# Patient Record
Sex: Female | Born: 1980 | Race: White | Hispanic: No | Marital: Married | Smoking: Current every day smoker
Health system: Southern US, Community
[De-identification: ages and names within clinical notes are randomized; demographics above are authoritative.]

## PROBLEM LIST (undated history)

## (undated) DIAGNOSIS — L732 Hidradenitis suppurativa: Secondary | ICD-10-CM

## (undated) DIAGNOSIS — Z87410 Personal history of cervical dysplasia: Secondary | ICD-10-CM

## (undated) DIAGNOSIS — Z9641 Presence of insulin pump (external) (internal): Secondary | ICD-10-CM

## (undated) DIAGNOSIS — F4312 Post-traumatic stress disorder, chronic: Secondary | ICD-10-CM

## (undated) DIAGNOSIS — Z860101 Personal history of adenomatous and serrated colon polyps: Secondary | ICD-10-CM

## (undated) DIAGNOSIS — J189 Pneumonia, unspecified organism: Secondary | ICD-10-CM

## (undated) DIAGNOSIS — Z8759 Personal history of other complications of pregnancy, childbirth and the puerperium: Secondary | ICD-10-CM

## (undated) DIAGNOSIS — G43909 Migraine, unspecified, not intractable, without status migrainosus: Secondary | ICD-10-CM

## (undated) DIAGNOSIS — Z86010 Personal history of colonic polyps: Secondary | ICD-10-CM

## (undated) DIAGNOSIS — E119 Type 2 diabetes mellitus without complications: Secondary | ICD-10-CM

## (undated) DIAGNOSIS — R011 Cardiac murmur, unspecified: Secondary | ICD-10-CM

## (undated) DIAGNOSIS — F411 Generalized anxiety disorder: Secondary | ICD-10-CM

## (undated) DIAGNOSIS — E559 Vitamin D deficiency, unspecified: Secondary | ICD-10-CM

## (undated) DIAGNOSIS — Z86718 Personal history of other venous thrombosis and embolism: Secondary | ICD-10-CM

## (undated) DIAGNOSIS — Z8639 Personal history of other endocrine, nutritional and metabolic disease: Secondary | ICD-10-CM

## (undated) DIAGNOSIS — N920 Excessive and frequent menstruation with regular cycle: Secondary | ICD-10-CM

## (undated) DIAGNOSIS — F410 Panic disorder [episodic paroxysmal anxiety] without agoraphobia: Secondary | ICD-10-CM

## (undated) DIAGNOSIS — E282 Polycystic ovarian syndrome: Secondary | ICD-10-CM

## (undated) DIAGNOSIS — C801 Malignant (primary) neoplasm, unspecified: Secondary | ICD-10-CM

## (undated) DIAGNOSIS — I471 Supraventricular tachycardia: Secondary | ICD-10-CM

## (undated) DIAGNOSIS — K219 Gastro-esophageal reflux disease without esophagitis: Secondary | ICD-10-CM

## (undated) DIAGNOSIS — F329 Major depressive disorder, single episode, unspecified: Secondary | ICD-10-CM

## (undated) DIAGNOSIS — F32A Depression, unspecified: Secondary | ICD-10-CM

## (undated) DIAGNOSIS — F3181 Bipolar II disorder: Secondary | ICD-10-CM

## (undated) DIAGNOSIS — O2230 Deep phlebothrombosis in pregnancy, unspecified trimester: Secondary | ICD-10-CM

## (undated) DIAGNOSIS — L02412 Cutaneous abscess of left axilla: Secondary | ICD-10-CM

## (undated) DIAGNOSIS — L0390 Cellulitis, unspecified: Secondary | ICD-10-CM

## (undated) DIAGNOSIS — Z8614 Personal history of Methicillin resistant Staphylococcus aureus infection: Secondary | ICD-10-CM

## (undated) DIAGNOSIS — M797 Fibromyalgia: Secondary | ICD-10-CM

## (undated) HISTORY — DX: Cardiac murmur, unspecified: R01.1

## (undated) HISTORY — DX: Personal history of Methicillin resistant Staphylococcus aureus infection: Z86.14

## (undated) HISTORY — PX: CHALAZION EXCISION: SHX213

## (undated) HISTORY — DX: Vitamin D deficiency, unspecified: E55.9

## (undated) HISTORY — PX: BUNIONECTOMY: SHX129

## (undated) HISTORY — PX: BREAST EXCISIONAL BIOPSY: SUR124

## (undated) HISTORY — PX: TONSILLECTOMY AND ADENOIDECTOMY: SUR1326

## (undated) HISTORY — PX: EXCISION OF BREAST BIOPSY: SHX5822

## (undated) HISTORY — DX: Supraventricular tachycardia: I47.1

## (undated) HISTORY — DX: Depression, unspecified: F32.A

## (undated) HISTORY — DX: Major depressive disorder, single episode, unspecified: F32.9

## (undated) HISTORY — PX: WISDOM TOOTH EXTRACTION: SHX21

## (undated) HISTORY — DX: Cutaneous abscess of left axilla: L02.412

## (undated) HISTORY — PX: CARDIAC CATHETERIZATION: SHX172

## (undated) HISTORY — PX: TONSILLECTOMY: SUR1361

## (undated) HISTORY — PX: EYE SURGERY: SHX253

## (undated) HISTORY — PX: BREAST SURGERY: SHX581

## (undated) HISTORY — DX: Migraine, unspecified, not intractable, without status migrainosus: G43.909

## (undated) HISTORY — DX: Deep phlebothrombosis in pregnancy, unspecified trimester: O22.30

---
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
## 2000-09-07 ENCOUNTER — Emergency Department (HOSPITAL_COMMUNITY): Admission: EM | Admit: 2000-09-07 | Discharge: 2000-09-08 | Payer: Self-pay | Admitting: Emergency Medicine

## 2000-09-21 ENCOUNTER — Ambulatory Visit (HOSPITAL_COMMUNITY): Admission: RE | Admit: 2000-09-21 | Discharge: 2000-09-21 | Payer: Self-pay | Admitting: Orthopedic Surgery

## 2000-10-05 ENCOUNTER — Encounter: Admission: RE | Admit: 2000-10-05 | Discharge: 2000-10-05 | Payer: Self-pay | Admitting: Family Medicine

## 2000-10-07 ENCOUNTER — Encounter: Admission: RE | Admit: 2000-10-07 | Discharge: 2001-01-05 | Payer: Self-pay | Admitting: Family Medicine

## 2000-10-13 ENCOUNTER — Encounter: Admission: RE | Admit: 2000-10-13 | Discharge: 2000-10-13 | Payer: Self-pay | Admitting: Family Medicine

## 2000-10-20 ENCOUNTER — Encounter: Admission: RE | Admit: 2000-10-20 | Discharge: 2000-10-20 | Payer: Self-pay | Admitting: Family Medicine

## 2001-01-17 ENCOUNTER — Inpatient Hospital Stay (HOSPITAL_COMMUNITY): Admission: EM | Admit: 2001-01-17 | Discharge: 2001-01-21 | Payer: Self-pay | Admitting: Emergency Medicine

## 2001-01-27 ENCOUNTER — Encounter: Admission: RE | Admit: 2001-01-27 | Discharge: 2001-01-27 | Payer: Self-pay | Admitting: Family Medicine

## 2001-02-04 ENCOUNTER — Encounter: Admission: RE | Admit: 2001-02-04 | Discharge: 2001-02-04 | Payer: Self-pay | Admitting: Family Medicine

## 2001-02-09 ENCOUNTER — Encounter: Admission: RE | Admit: 2001-02-09 | Discharge: 2001-05-10 | Payer: Self-pay | Admitting: Sports Medicine

## 2001-02-24 ENCOUNTER — Ambulatory Visit: Admission: RE | Admit: 2001-02-24 | Discharge: 2001-02-24 | Payer: Self-pay | Admitting: Family Medicine

## 2001-02-24 ENCOUNTER — Encounter: Admission: RE | Admit: 2001-02-24 | Discharge: 2001-02-24 | Payer: Self-pay | Admitting: Family Medicine

## 2001-03-01 ENCOUNTER — Encounter: Admission: RE | Admit: 2001-03-01 | Discharge: 2001-03-01 | Payer: Self-pay | Admitting: Family Medicine

## 2001-04-05 ENCOUNTER — Encounter: Admission: RE | Admit: 2001-04-05 | Discharge: 2001-04-05 | Payer: Self-pay | Admitting: Family Medicine

## 2001-04-21 ENCOUNTER — Encounter: Admission: RE | Admit: 2001-04-21 | Discharge: 2001-04-21 | Payer: Self-pay | Admitting: Family Medicine

## 2001-06-25 ENCOUNTER — Encounter: Admission: RE | Admit: 2001-06-25 | Discharge: 2001-06-25 | Payer: Self-pay | Admitting: Family Medicine

## 2001-07-05 ENCOUNTER — Encounter: Admission: RE | Admit: 2001-07-05 | Discharge: 2001-07-05 | Payer: Self-pay | Admitting: Family Medicine

## 2001-07-12 ENCOUNTER — Encounter: Admission: RE | Admit: 2001-07-12 | Discharge: 2001-07-12 | Payer: Self-pay | Admitting: Family Medicine

## 2001-07-26 ENCOUNTER — Encounter: Admission: RE | Admit: 2001-07-26 | Discharge: 2001-07-26 | Payer: Self-pay | Admitting: Sports Medicine

## 2001-08-16 ENCOUNTER — Encounter: Admission: RE | Admit: 2001-08-16 | Discharge: 2001-08-16 | Payer: Self-pay | Admitting: Family Medicine

## 2001-10-09 ENCOUNTER — Inpatient Hospital Stay (HOSPITAL_COMMUNITY): Admission: EM | Admit: 2001-10-09 | Discharge: 2001-10-12 | Payer: Self-pay | Admitting: *Deleted

## 2001-10-20 ENCOUNTER — Encounter: Admission: RE | Admit: 2001-10-20 | Discharge: 2001-10-20 | Payer: Self-pay | Admitting: Family Medicine

## 2001-10-25 ENCOUNTER — Encounter: Admission: RE | Admit: 2001-10-25 | Discharge: 2001-10-25 | Payer: Self-pay | Admitting: Sports Medicine

## 2001-11-01 ENCOUNTER — Encounter: Admission: RE | Admit: 2001-11-01 | Discharge: 2002-01-30 | Payer: Self-pay | Admitting: Family Medicine

## 2002-02-10 ENCOUNTER — Encounter: Admission: RE | Admit: 2002-02-10 | Discharge: 2002-02-10 | Payer: Self-pay | Admitting: Family Medicine

## 2002-03-10 ENCOUNTER — Other Ambulatory Visit: Admission: RE | Admit: 2002-03-10 | Discharge: 2002-03-10 | Payer: Self-pay | Admitting: Gynecology

## 2002-05-16 ENCOUNTER — Encounter: Admission: RE | Admit: 2002-05-16 | Discharge: 2002-05-16 | Payer: Self-pay | Admitting: Family Medicine

## 2002-05-24 ENCOUNTER — Encounter: Admission: RE | Admit: 2002-05-24 | Discharge: 2002-05-24 | Payer: Self-pay | Admitting: Family Medicine

## 2002-06-15 ENCOUNTER — Encounter: Admission: RE | Admit: 2002-06-15 | Discharge: 2002-06-15 | Payer: Self-pay | Admitting: Family Medicine

## 2002-06-20 ENCOUNTER — Encounter: Admission: RE | Admit: 2002-06-20 | Discharge: 2002-06-20 | Payer: Self-pay | Admitting: Sports Medicine

## 2002-07-28 ENCOUNTER — Encounter: Admission: RE | Admit: 2002-07-28 | Discharge: 2002-07-28 | Payer: Self-pay | Admitting: Family Medicine

## 2002-08-22 ENCOUNTER — Encounter (INDEPENDENT_AMBULATORY_CARE_PROVIDER_SITE_OTHER): Payer: Self-pay | Admitting: *Deleted

## 2002-08-22 LAB — CONVERTED CEMR LAB

## 2002-09-09 ENCOUNTER — Encounter: Admission: RE | Admit: 2002-09-09 | Discharge: 2002-09-09 | Payer: Self-pay | Admitting: Family Medicine

## 2002-09-19 ENCOUNTER — Encounter: Admission: RE | Admit: 2002-09-19 | Discharge: 2002-09-19 | Payer: Self-pay | Admitting: Sports Medicine

## 2002-10-21 ENCOUNTER — Encounter: Admission: RE | Admit: 2002-10-21 | Discharge: 2002-10-21 | Payer: Self-pay | Admitting: Family Medicine

## 2002-11-09 ENCOUNTER — Encounter: Admission: RE | Admit: 2002-11-09 | Discharge: 2002-11-09 | Payer: Self-pay | Admitting: Family Medicine

## 2002-11-21 ENCOUNTER — Encounter: Admission: RE | Admit: 2002-11-21 | Discharge: 2002-11-21 | Payer: Self-pay | Admitting: Family Medicine

## 2002-11-23 ENCOUNTER — Encounter: Admission: RE | Admit: 2002-11-23 | Discharge: 2003-02-21 | Payer: Self-pay | Admitting: Family Medicine

## 2003-06-22 ENCOUNTER — Other Ambulatory Visit: Admission: RE | Admit: 2003-06-22 | Discharge: 2003-06-22 | Payer: Self-pay | Admitting: Gynecology

## 2003-12-14 ENCOUNTER — Emergency Department (HOSPITAL_COMMUNITY): Admission: AD | Admit: 2003-12-14 | Discharge: 2003-12-14 | Payer: Self-pay | Admitting: Family Medicine

## 2004-03-19 ENCOUNTER — Other Ambulatory Visit: Admission: RE | Admit: 2004-03-19 | Discharge: 2004-03-19 | Payer: Self-pay | Admitting: Gynecology

## 2004-07-24 ENCOUNTER — Ambulatory Visit: Payer: Self-pay | Admitting: Internal Medicine

## 2004-08-19 ENCOUNTER — Emergency Department (HOSPITAL_COMMUNITY): Admission: EM | Admit: 2004-08-19 | Discharge: 2004-08-19 | Payer: Self-pay | Admitting: Family Medicine

## 2004-08-20 ENCOUNTER — Emergency Department (HOSPITAL_COMMUNITY): Admission: EM | Admit: 2004-08-20 | Discharge: 2004-08-20 | Payer: Self-pay | Admitting: Emergency Medicine

## 2004-09-22 HISTORY — PX: TAYLOR BUNIONECTOMY: SHX2485

## 2004-10-22 ENCOUNTER — Ambulatory Visit: Payer: Self-pay | Admitting: Internal Medicine

## 2004-11-01 ENCOUNTER — Ambulatory Visit: Payer: Self-pay | Admitting: Licensed Clinical Social Worker

## 2004-11-04 ENCOUNTER — Ambulatory Visit: Payer: Self-pay | Admitting: Licensed Clinical Social Worker

## 2004-11-10 ENCOUNTER — Emergency Department (HOSPITAL_COMMUNITY): Admission: EM | Admit: 2004-11-10 | Discharge: 2004-11-11 | Payer: Self-pay | Admitting: Nurse Practitioner

## 2004-11-12 ENCOUNTER — Ambulatory Visit: Payer: Self-pay | Admitting: Internal Medicine

## 2004-11-14 ENCOUNTER — Ambulatory Visit: Payer: Self-pay | Admitting: Licensed Clinical Social Worker

## 2004-11-20 ENCOUNTER — Ambulatory Visit: Payer: Self-pay | Admitting: Licensed Clinical Social Worker

## 2004-12-03 ENCOUNTER — Ambulatory Visit: Payer: Self-pay | Admitting: Licensed Clinical Social Worker

## 2004-12-13 ENCOUNTER — Ambulatory Visit: Payer: Self-pay | Admitting: Internal Medicine

## 2004-12-16 ENCOUNTER — Ambulatory Visit: Payer: Self-pay | Admitting: Internal Medicine

## 2004-12-27 ENCOUNTER — Ambulatory Visit: Payer: Self-pay | Admitting: Endocrinology

## 2005-03-03 ENCOUNTER — Ambulatory Visit: Payer: Self-pay | Admitting: Internal Medicine

## 2005-04-04 ENCOUNTER — Ambulatory Visit: Payer: Self-pay | Admitting: Endocrinology

## 2005-04-09 ENCOUNTER — Ambulatory Visit: Payer: Self-pay | Admitting: Endocrinology

## 2005-04-16 ENCOUNTER — Ambulatory Visit: Payer: Self-pay | Admitting: Endocrinology

## 2005-05-07 ENCOUNTER — Ambulatory Visit: Payer: Self-pay | Admitting: Internal Medicine

## 2005-07-09 ENCOUNTER — Ambulatory Visit: Payer: Self-pay | Admitting: Endocrinology

## 2005-07-25 ENCOUNTER — Ambulatory Visit: Payer: Self-pay | Admitting: Internal Medicine

## 2005-07-29 ENCOUNTER — Ambulatory Visit: Payer: Self-pay | Admitting: Endocrinology

## 2005-10-15 ENCOUNTER — Ambulatory Visit: Payer: Self-pay | Admitting: Internal Medicine

## 2005-10-21 ENCOUNTER — Emergency Department (HOSPITAL_COMMUNITY): Admission: EM | Admit: 2005-10-21 | Discharge: 2005-10-22 | Payer: Self-pay | Admitting: *Deleted

## 2005-10-22 ENCOUNTER — Encounter (INDEPENDENT_AMBULATORY_CARE_PROVIDER_SITE_OTHER): Payer: Self-pay | Admitting: *Deleted

## 2005-12-02 ENCOUNTER — Ambulatory Visit: Payer: Self-pay | Admitting: Internal Medicine

## 2005-12-19 ENCOUNTER — Ambulatory Visit: Payer: Self-pay | Admitting: Internal Medicine

## 2005-12-23 ENCOUNTER — Ambulatory Visit: Payer: Self-pay | Admitting: Endocrinology

## 2005-12-29 ENCOUNTER — Emergency Department (HOSPITAL_COMMUNITY): Admission: EM | Admit: 2005-12-29 | Discharge: 2005-12-29 | Payer: Self-pay | Admitting: Emergency Medicine

## 2005-12-31 ENCOUNTER — Ambulatory Visit: Payer: Self-pay | Admitting: Internal Medicine

## 2005-12-31 ENCOUNTER — Ambulatory Visit: Payer: Self-pay | Admitting: Endocrinology

## 2006-01-01 ENCOUNTER — Encounter: Admission: RE | Admit: 2006-01-01 | Discharge: 2006-01-01 | Payer: Self-pay | Admitting: Endocrinology

## 2006-01-14 ENCOUNTER — Ambulatory Visit: Payer: Self-pay | Admitting: Endocrinology

## 2006-01-20 ENCOUNTER — Ambulatory Visit: Payer: Self-pay | Admitting: Internal Medicine

## 2006-01-21 ENCOUNTER — Encounter: Payer: Self-pay | Admitting: Emergency Medicine

## 2006-02-10 ENCOUNTER — Ambulatory Visit: Payer: Self-pay | Admitting: Gastroenterology

## 2006-03-17 ENCOUNTER — Ambulatory Visit: Payer: Self-pay | Admitting: Endocrinology

## 2006-04-07 ENCOUNTER — Ambulatory Visit: Payer: Self-pay | Admitting: Endocrinology

## 2006-05-15 ENCOUNTER — Ambulatory Visit: Payer: Self-pay | Admitting: Endocrinology

## 2006-05-22 ENCOUNTER — Other Ambulatory Visit: Admission: RE | Admit: 2006-05-22 | Discharge: 2006-05-22 | Payer: Self-pay | Admitting: Gynecology

## 2006-05-27 ENCOUNTER — Ambulatory Visit: Payer: Self-pay | Admitting: Endocrinology

## 2006-07-22 ENCOUNTER — Ambulatory Visit: Payer: Self-pay | Admitting: Endocrinology

## 2006-09-14 ENCOUNTER — Ambulatory Visit: Payer: Self-pay | Admitting: Internal Medicine

## 2006-10-22 ENCOUNTER — Emergency Department (HOSPITAL_COMMUNITY): Admission: EM | Admit: 2006-10-22 | Discharge: 2006-10-22 | Payer: Self-pay | Admitting: Family Medicine

## 2006-11-20 ENCOUNTER — Encounter (INDEPENDENT_AMBULATORY_CARE_PROVIDER_SITE_OTHER): Payer: Self-pay | Admitting: *Deleted

## 2006-11-25 ENCOUNTER — Ambulatory Visit: Payer: Self-pay | Admitting: Internal Medicine

## 2006-11-25 LAB — CONVERTED CEMR LAB
AST: 17 units/L (ref 0–37)
Alkaline Phosphatase: 82 units/L (ref 39–117)
BUN: 9 mg/dL (ref 6–23)
Basophils Absolute: 0.1 10*3/uL (ref 0.0–0.1)
Bilirubin, Direct: 0.1 mg/dL (ref 0.0–0.3)
Calcium: 9.2 mg/dL (ref 8.4–10.5)
GFR calc non Af Amer: 129 mL/min
HCT: 40.3 % (ref 36.0–46.0)
Hemoglobin: 14.1 g/dL (ref 12.0–15.0)
Hgb A1c MFr Bld: 12.2 % — ABNORMAL HIGH (ref 4.6–6.0)
Lymphocytes Relative: 37.3 % (ref 12.0–46.0)
MCHC: 34.9 g/dL (ref 30.0–36.0)
Neutrophils Relative %: 51.9 % (ref 43.0–77.0)
Potassium: 4 meq/L (ref 3.5–5.1)
Total Bilirubin: 0.6 mg/dL (ref 0.3–1.2)
Total Protein: 6 g/dL (ref 6.0–8.3)
WBC: 7.6 10*3/uL (ref 4.5–10.5)

## 2006-12-02 ENCOUNTER — Encounter (INDEPENDENT_AMBULATORY_CARE_PROVIDER_SITE_OTHER): Payer: Self-pay | Admitting: *Deleted

## 2006-12-02 ENCOUNTER — Observation Stay (HOSPITAL_COMMUNITY): Admission: EM | Admit: 2006-12-02 | Discharge: 2006-12-03 | Payer: Self-pay | Admitting: Emergency Medicine

## 2006-12-02 HISTORY — PX: CHOLECYSTECTOMY: SHX55

## 2006-12-02 HISTORY — PX: CHOLECYSTECTOMY, LAPAROSCOPIC: SHX56

## 2007-01-13 ENCOUNTER — Ambulatory Visit: Payer: Self-pay | Admitting: Internal Medicine

## 2007-01-13 LAB — CONVERTED CEMR LAB
TSH: 0.77 microintl units/mL (ref 0.35–5.50)
Transferrin: 231.6 mg/dL (ref >=212.0)
Vitamin B-12: 228 pg/mL (ref 211–911)

## 2007-02-03 ENCOUNTER — Ambulatory Visit: Payer: Self-pay | Admitting: Pulmonary Disease

## 2007-02-14 ENCOUNTER — Encounter: Payer: Self-pay | Admitting: Gynecology

## 2007-02-14 ENCOUNTER — Ambulatory Visit (HOSPITAL_COMMUNITY): Admission: AD | Admit: 2007-02-14 | Discharge: 2007-02-14 | Payer: Self-pay | Admitting: Gynecology

## 2007-02-14 HISTORY — PX: DILATION AND EVACUATION: SHX1459

## 2007-02-28 ENCOUNTER — Emergency Department (HOSPITAL_COMMUNITY): Admission: EM | Admit: 2007-02-28 | Discharge: 2007-02-28 | Payer: Self-pay | Admitting: Emergency Medicine

## 2007-04-08 DIAGNOSIS — E109 Type 1 diabetes mellitus without complications: Secondary | ICD-10-CM | POA: Insufficient documentation

## 2007-04-08 DIAGNOSIS — F17200 Nicotine dependence, unspecified, uncomplicated: Secondary | ICD-10-CM | POA: Insufficient documentation

## 2007-04-08 DIAGNOSIS — Z87898 Personal history of other specified conditions: Secondary | ICD-10-CM | POA: Insufficient documentation

## 2007-04-08 DIAGNOSIS — J45998 Other asthma: Secondary | ICD-10-CM | POA: Insufficient documentation

## 2007-04-08 DIAGNOSIS — F329 Major depressive disorder, single episode, unspecified: Secondary | ICD-10-CM

## 2007-04-08 DIAGNOSIS — F32A Depression, unspecified: Secondary | ICD-10-CM | POA: Insufficient documentation

## 2007-05-25 ENCOUNTER — Ambulatory Visit: Payer: Self-pay | Admitting: Internal Medicine

## 2007-07-07 ENCOUNTER — Ambulatory Visit: Payer: Self-pay | Admitting: Internal Medicine

## 2007-07-07 DIAGNOSIS — R000 Tachycardia, unspecified: Secondary | ICD-10-CM | POA: Insufficient documentation

## 2007-07-07 LAB — CONVERTED CEMR LAB
Calcium: 9.1 mg/dL (ref 8.4–10.5)
TSH: 0.87 microintl units/mL (ref 0.35–5.50)

## 2007-08-18 ENCOUNTER — Encounter: Payer: Self-pay | Admitting: Internal Medicine

## 2007-09-15 ENCOUNTER — Emergency Department (HOSPITAL_COMMUNITY): Admission: EM | Admit: 2007-09-15 | Discharge: 2007-09-15 | Payer: Self-pay | Admitting: Emergency Medicine

## 2007-10-15 ENCOUNTER — Emergency Department (HOSPITAL_COMMUNITY): Admission: EM | Admit: 2007-10-15 | Discharge: 2007-10-15 | Payer: Self-pay | Admitting: Emergency Medicine

## 2007-10-18 ENCOUNTER — Telehealth: Payer: Self-pay | Admitting: Internal Medicine

## 2007-10-19 ENCOUNTER — Ambulatory Visit: Payer: Self-pay | Admitting: Internal Medicine

## 2007-10-20 LAB — CONVERTED CEMR LAB
Amylase: 61 units/L (ref 27–131)
BUN: 14 mg/dL (ref 6–23)
Basophils Relative: 0.6 % (ref 0.0–1.0)
CO2: 29 meq/L (ref 19–32)
Calcium: 9.6 mg/dL (ref 8.4–10.5)
Creatinine, Ser: 0.7 mg/dL (ref 0.4–1.2)
GFR calc Af Amer: 130 mL/min
Glucose, Bld: 324 mg/dL — ABNORMAL HIGH (ref 70–99)
Lymphocytes Relative: 39.7 % (ref 12.0–46.0)
MCHC: 33.7 g/dL (ref 30.0–36.0)
MCV: 92.7 fL (ref 78.0–100.0)
Monocytes Absolute: 0.5 10*3/uL (ref 0.2–0.7)
Monocytes Relative: 5.6 % (ref 3.0–11.0)
Neutro Abs: 4.1 10*3/uL (ref 1.4–7.7)
Neutrophils Relative %: 49.8 % (ref 43.0–77.0)
Potassium: 4.4 meq/L (ref 3.5–5.1)
RBC: 4.73 M/uL (ref 3.87–5.11)
Sodium: 138 meq/L (ref 135–145)
WBC: 8.1 10*3/uL (ref 4.5–10.5)

## 2007-10-22 ENCOUNTER — Ambulatory Visit: Payer: Self-pay | Admitting: Pulmonary Disease

## 2007-10-24 DIAGNOSIS — F411 Generalized anxiety disorder: Secondary | ICD-10-CM | POA: Insufficient documentation

## 2007-11-14 ENCOUNTER — Ambulatory Visit: Payer: Self-pay | Admitting: Internal Medicine

## 2007-11-14 ENCOUNTER — Inpatient Hospital Stay (HOSPITAL_COMMUNITY): Admission: EM | Admit: 2007-11-14 | Discharge: 2007-11-20 | Payer: Self-pay | Admitting: Emergency Medicine

## 2007-11-14 DIAGNOSIS — E871 Hypo-osmolality and hyponatremia: Secondary | ICD-10-CM | POA: Insufficient documentation

## 2007-11-14 DIAGNOSIS — D72829 Elevated white blood cell count, unspecified: Secondary | ICD-10-CM | POA: Insufficient documentation

## 2007-11-14 DIAGNOSIS — E1010 Type 1 diabetes mellitus with ketoacidosis without coma: Secondary | ICD-10-CM | POA: Insufficient documentation

## 2007-11-29 ENCOUNTER — Ambulatory Visit: Payer: Self-pay | Admitting: Internal Medicine

## 2007-11-29 DIAGNOSIS — J209 Acute bronchitis, unspecified: Secondary | ICD-10-CM | POA: Insufficient documentation

## 2008-01-26 ENCOUNTER — Emergency Department (HOSPITAL_COMMUNITY): Admission: EM | Admit: 2008-01-26 | Discharge: 2008-01-26 | Payer: Self-pay | Admitting: Emergency Medicine

## 2008-01-28 ENCOUNTER — Ambulatory Visit: Payer: Self-pay | Admitting: Internal Medicine

## 2008-02-01 ENCOUNTER — Emergency Department (HOSPITAL_COMMUNITY): Admission: EM | Admit: 2008-02-01 | Discharge: 2008-02-01 | Payer: Self-pay | Admitting: Family Medicine

## 2008-03-17 ENCOUNTER — Ambulatory Visit: Payer: Self-pay | Admitting: Internal Medicine

## 2008-03-17 DIAGNOSIS — R110 Nausea: Secondary | ICD-10-CM | POA: Insufficient documentation

## 2008-04-25 ENCOUNTER — Emergency Department (HOSPITAL_COMMUNITY): Admission: EM | Admit: 2008-04-25 | Discharge: 2008-04-25 | Payer: Self-pay | Admitting: Emergency Medicine

## 2008-05-09 ENCOUNTER — Ambulatory Visit: Payer: Self-pay | Admitting: Pulmonary Disease

## 2008-06-05 ENCOUNTER — Ambulatory Visit: Payer: Self-pay | Admitting: Internal Medicine

## 2008-06-08 ENCOUNTER — Emergency Department (HOSPITAL_COMMUNITY): Admission: EM | Admit: 2008-06-08 | Discharge: 2008-06-08 | Payer: Self-pay | Admitting: Family Medicine

## 2008-06-16 ENCOUNTER — Encounter: Payer: Self-pay | Admitting: Adult Health

## 2008-06-27 ENCOUNTER — Ambulatory Visit: Payer: Self-pay | Admitting: Internal Medicine

## 2008-06-28 ENCOUNTER — Encounter: Payer: Self-pay | Admitting: Internal Medicine

## 2008-07-05 ENCOUNTER — Encounter: Payer: Self-pay | Admitting: Internal Medicine

## 2008-07-06 ENCOUNTER — Encounter: Payer: Self-pay | Admitting: Internal Medicine

## 2008-07-12 ENCOUNTER — Encounter (INDEPENDENT_AMBULATORY_CARE_PROVIDER_SITE_OTHER): Payer: Self-pay | Admitting: *Deleted

## 2008-07-18 ENCOUNTER — Telehealth: Payer: Self-pay | Admitting: Internal Medicine

## 2008-07-25 ENCOUNTER — Ambulatory Visit: Payer: Self-pay | Admitting: Internal Medicine

## 2008-07-27 ENCOUNTER — Encounter: Payer: Self-pay | Admitting: Internal Medicine

## 2008-07-30 ENCOUNTER — Inpatient Hospital Stay (HOSPITAL_COMMUNITY): Admission: EM | Admit: 2008-07-30 | Discharge: 2008-08-02 | Payer: Self-pay | Admitting: Emergency Medicine

## 2008-07-30 ENCOUNTER — Ambulatory Visit: Payer: Self-pay | Admitting: Internal Medicine

## 2008-08-04 ENCOUNTER — Ambulatory Visit: Payer: Self-pay | Admitting: Internal Medicine

## 2008-08-04 LAB — CONVERTED CEMR LAB
ALT: 31 units/L (ref 0–35)
AST: 43 units/L — ABNORMAL HIGH (ref 0–37)
Albumin: 2.9 g/dL — ABNORMAL LOW (ref 3.5–5.2)
Bilirubin Urine: NEGATIVE
Calcium, Total (PTH): 8.9 mg/dL (ref 8.4–10.5)
Calcium: 8.7 mg/dL (ref 8.4–10.5)
Chloride: 110 meq/L (ref 96–112)
Creatinine, Ser: 0.6 mg/dL (ref 0.4–1.2)
Eosinophils Absolute: 0.3 10*3/uL (ref 0.0–0.7)
Eosinophils Relative: 3.8 % (ref 0.0–5.0)
GFR calc Af Amer: 155 mL/min
GFR calc non Af Amer: 128 mL/min
Glucose, Bld: 78 mg/dL (ref 70–99)
HCT: 36.2 % (ref 36.0–46.0)
HDL: 40.5 mg/dL (ref >39.0)
Hemoglobin, Urine: NEGATIVE
Leukocytes, UA: NEGATIVE
MCHC: 35.8 g/dL (ref 30.0–36.0)
MCV: 93 fL (ref 78.0–100.0)
Magnesium: 1.6 mg/dL (ref 1.5–2.5)
Monocytes Relative: 6.7 % (ref 3.0–12.0)
Neutrophils Relative %: 60.1 % (ref 43.0–77.0)
Platelets: 355 10*3/uL (ref 150–400)
Sed Rate: 17 mm/hr (ref 0–22)
TSH: 1.03 microintl units/mL (ref 0.35–5.50)
Total Bilirubin: 0.4 mg/dL (ref 0.3–1.2)
Triglycerides: 59 mg/dL (ref 0–149)
Urobilinogen, UA: 0.2 (ref 0.0–1.0)

## 2008-08-06 ENCOUNTER — Encounter: Payer: Self-pay | Admitting: Internal Medicine

## 2008-08-07 ENCOUNTER — Telehealth: Payer: Self-pay | Admitting: Internal Medicine

## 2008-08-11 ENCOUNTER — Ambulatory Visit: Payer: Self-pay | Admitting: Internal Medicine

## 2008-08-11 LAB — CONVERTED CEMR LAB
CO2: 30 meq/L (ref 19–32)
Calcium: 9.5 mg/dL (ref 8.4–10.5)
Creatinine, Ser: 0.6 mg/dL (ref 0.4–1.2)
GFR calc non Af Amer: 128 mL/min
Sodium: 138 meq/L (ref 135–145)

## 2008-08-22 ENCOUNTER — Ambulatory Visit: Payer: Self-pay | Admitting: Internal Medicine

## 2008-09-01 ENCOUNTER — Encounter: Payer: Self-pay | Admitting: Internal Medicine

## 2008-09-07 ENCOUNTER — Emergency Department (HOSPITAL_COMMUNITY): Admission: EM | Admit: 2008-09-07 | Discharge: 2008-09-07 | Payer: Self-pay | Admitting: Family Medicine

## 2008-10-05 ENCOUNTER — Telehealth: Payer: Self-pay | Admitting: Internal Medicine

## 2008-10-18 ENCOUNTER — Telehealth: Payer: Self-pay | Admitting: Internal Medicine

## 2008-11-02 ENCOUNTER — Telehealth: Payer: Self-pay | Admitting: Internal Medicine

## 2008-11-16 ENCOUNTER — Other Ambulatory Visit: Payer: Self-pay | Admitting: Emergency Medicine

## 2008-11-16 ENCOUNTER — Inpatient Hospital Stay (HOSPITAL_COMMUNITY): Admission: AD | Admit: 2008-11-16 | Discharge: 2008-11-17 | Payer: Self-pay | Admitting: Obstetrics and Gynecology

## 2008-11-16 ENCOUNTER — Other Ambulatory Visit: Payer: Self-pay

## 2008-11-20 ENCOUNTER — Ambulatory Visit: Payer: Self-pay | Admitting: Internal Medicine

## 2008-12-02 ENCOUNTER — Inpatient Hospital Stay (HOSPITAL_COMMUNITY): Admission: AD | Admit: 2008-12-02 | Discharge: 2008-12-02 | Payer: Self-pay | Admitting: Obstetrics and Gynecology

## 2008-12-03 ENCOUNTER — Inpatient Hospital Stay (HOSPITAL_COMMUNITY): Admission: AD | Admit: 2008-12-03 | Discharge: 2008-12-04 | Payer: Self-pay | Admitting: Obstetrics and Gynecology

## 2009-01-08 ENCOUNTER — Emergency Department (HOSPITAL_COMMUNITY): Admission: EM | Admit: 2009-01-08 | Discharge: 2009-01-08 | Payer: Self-pay | Admitting: Family Medicine

## 2009-01-11 ENCOUNTER — Telehealth: Payer: Self-pay | Admitting: Internal Medicine

## 2009-03-07 ENCOUNTER — Telehealth: Payer: Self-pay | Admitting: Internal Medicine

## 2009-04-04 ENCOUNTER — Inpatient Hospital Stay (HOSPITAL_COMMUNITY): Admission: AD | Admit: 2009-04-04 | Discharge: 2009-04-06 | Payer: Self-pay | Admitting: Obstetrics and Gynecology

## 2009-04-04 ENCOUNTER — Encounter (INDEPENDENT_AMBULATORY_CARE_PROVIDER_SITE_OTHER): Payer: Self-pay | Admitting: Obstetrics and Gynecology

## 2009-07-22 ENCOUNTER — Ambulatory Visit: Payer: Self-pay | Admitting: Critical Care Medicine

## 2009-07-22 ENCOUNTER — Inpatient Hospital Stay (HOSPITAL_COMMUNITY): Admission: EM | Admit: 2009-07-22 | Discharge: 2009-07-27 | Payer: Self-pay | Admitting: Emergency Medicine

## 2009-07-30 ENCOUNTER — Ambulatory Visit: Payer: Self-pay | Admitting: Critical Care Medicine

## 2009-07-31 ENCOUNTER — Encounter: Payer: Self-pay | Admitting: Critical Care Medicine

## 2009-07-31 LAB — CONVERTED CEMR LAB
Hemoglobin, Urine: NEGATIVE
Ketones, ur: NEGATIVE mg/dL
Leukocytes, UA: NEGATIVE
Total Protein, Urine: 30 mg/dL
Urobilinogen, UA: 0.2 (ref 0.0–1.0)
pH: 6.5 (ref 5.0–8.0)

## 2009-08-01 ENCOUNTER — Telehealth: Payer: Self-pay | Admitting: Internal Medicine

## 2009-08-01 ENCOUNTER — Encounter: Payer: Self-pay | Admitting: Internal Medicine

## 2009-08-22 ENCOUNTER — Ambulatory Visit: Payer: Self-pay | Admitting: Internal Medicine

## 2009-08-22 DIAGNOSIS — R05 Cough: Secondary | ICD-10-CM

## 2009-08-22 DIAGNOSIS — R059 Cough, unspecified: Secondary | ICD-10-CM | POA: Insufficient documentation

## 2009-09-04 ENCOUNTER — Telehealth: Payer: Self-pay | Admitting: Internal Medicine

## 2009-09-19 ENCOUNTER — Ambulatory Visit: Payer: Self-pay | Admitting: Internal Medicine

## 2009-09-19 DIAGNOSIS — R519 Headache, unspecified: Secondary | ICD-10-CM | POA: Insufficient documentation

## 2009-09-19 DIAGNOSIS — R51 Headache: Secondary | ICD-10-CM | POA: Insufficient documentation

## 2009-10-08 ENCOUNTER — Telehealth: Payer: Self-pay | Admitting: Internal Medicine

## 2009-10-10 ENCOUNTER — Telehealth: Payer: Self-pay | Admitting: Internal Medicine

## 2009-10-17 ENCOUNTER — Ambulatory Visit: Payer: Self-pay | Admitting: Internal Medicine

## 2009-10-17 DIAGNOSIS — J069 Acute upper respiratory infection, unspecified: Secondary | ICD-10-CM | POA: Insufficient documentation

## 2009-10-31 ENCOUNTER — Encounter: Payer: Self-pay | Admitting: Internal Medicine

## 2009-11-08 ENCOUNTER — Telehealth: Payer: Self-pay | Admitting: Internal Medicine

## 2009-11-28 ENCOUNTER — Encounter: Admission: RE | Admit: 2009-11-28 | Discharge: 2009-11-28 | Payer: Self-pay | Admitting: Internal Medicine

## 2009-12-10 ENCOUNTER — Inpatient Hospital Stay (HOSPITAL_COMMUNITY): Admission: EM | Admit: 2009-12-10 | Discharge: 2009-12-12 | Payer: Self-pay | Admitting: Emergency Medicine

## 2009-12-19 ENCOUNTER — Encounter: Payer: Self-pay | Admitting: Internal Medicine

## 2010-01-18 ENCOUNTER — Ambulatory Visit (HOSPITAL_COMMUNITY): Admission: RE | Admit: 2010-01-18 | Discharge: 2010-01-18 | Payer: Self-pay | Admitting: Internal Medicine

## 2010-03-12 ENCOUNTER — Ambulatory Visit: Payer: Self-pay | Admitting: Internal Medicine

## 2010-03-12 ENCOUNTER — Telehealth: Payer: Self-pay | Admitting: Internal Medicine

## 2010-03-13 ENCOUNTER — Emergency Department (HOSPITAL_COMMUNITY): Admission: EM | Admit: 2010-03-13 | Discharge: 2010-03-13 | Payer: Self-pay | Admitting: Family Medicine

## 2010-03-14 ENCOUNTER — Ambulatory Visit: Payer: Self-pay | Admitting: Internal Medicine

## 2010-04-01 ENCOUNTER — Inpatient Hospital Stay (HOSPITAL_COMMUNITY): Admission: EM | Admit: 2010-04-01 | Discharge: 2010-04-03 | Payer: Self-pay | Admitting: Emergency Medicine

## 2010-05-10 ENCOUNTER — Ambulatory Visit: Payer: Self-pay | Admitting: Internal Medicine

## 2010-05-10 ENCOUNTER — Encounter (INDEPENDENT_AMBULATORY_CARE_PROVIDER_SITE_OTHER): Payer: Self-pay | Admitting: *Deleted

## 2010-05-10 LAB — CONVERTED CEMR LAB
ALT: 14 units/L (ref 0–35)
AST: 21 units/L (ref 0–37)
Albumin: 3.7 g/dL (ref 3.5–5.2)
Alkaline Phosphatase: 81 units/L (ref 39–117)
BUN: 16 mg/dL (ref 6–23)
Calcium: 9.5 mg/dL (ref 8.4–10.5)
Cholesterol: 196 mg/dL (ref 0–200)
Creatinine, Ser: 0.6 mg/dL (ref 0.4–1.2)
LDL Cholesterol: 124 mg/dL — ABNORMAL HIGH (ref 0–99)
Potassium: 4.1 meq/L (ref 3.5–5.1)
Sodium: 140 meq/L (ref 135–145)
Total Bilirubin: 0.5 mg/dL (ref 0.3–1.2)
Total Protein: 6.8 g/dL (ref 6.0–8.3)

## 2010-05-20 ENCOUNTER — Ambulatory Visit: Payer: Self-pay | Admitting: Internal Medicine

## 2010-05-20 DIAGNOSIS — J302 Other seasonal allergic rhinitis: Secondary | ICD-10-CM

## 2010-05-20 DIAGNOSIS — J3089 Other allergic rhinitis: Secondary | ICD-10-CM | POA: Insufficient documentation

## 2010-05-29 ENCOUNTER — Other Ambulatory Visit: Admission: RE | Admit: 2010-05-29 | Discharge: 2010-05-29 | Payer: Self-pay | Admitting: Family Medicine

## 2010-07-18 ENCOUNTER — Ambulatory Visit: Payer: Self-pay | Admitting: Internal Medicine

## 2010-09-25 ENCOUNTER — Encounter: Payer: Self-pay | Admitting: Internal Medicine

## 2010-09-25 ENCOUNTER — Ambulatory Visit (HOSPITAL_COMMUNITY)
Admission: RE | Admit: 2010-09-25 | Discharge: 2010-09-25 | Payer: Self-pay | Source: Home / Self Care | Attending: Internal Medicine | Admitting: Internal Medicine

## 2010-09-27 ENCOUNTER — Encounter: Payer: Self-pay | Admitting: Internal Medicine

## 2010-10-13 ENCOUNTER — Encounter: Payer: Self-pay | Admitting: Internal Medicine

## 2010-10-22 NOTE — Assessment & Plan Note (Signed)
Summary: hives/cb  Nurse Visit   Allergies: 1)  ! Phenergan (Promethazine Hcl) 2)  ! Darvocet A500 3)  ! Avandia (Rosiglitazone Maleate) 4)  ! Sulfadiazine (Sulfadiazine) 5)  ! Penicillin V Potassium (Penicillin V Potassium) 6)  ! Ceclor 7)  ! Percocet 8)  ! Levaquin (Levofloxacin) 9)  ! * Avelox 10)  ! * Latex   ORDER WAS PUT IN UNDER THE PHONE NOTE

## 2010-10-22 NOTE — Letter (Signed)
Summary: New Patient letter  [facility]  [address] [place], [place] [address]   Phone: [phone]  Fax: [phone]       05/10/2010 MRN: [id number]  [facility] [address] [place], [place]  [address]  Dear Ms. [facility],  Welcome to the Gastroenterology Division at [facility].    You are scheduled to see Dr.  Russella Dar on 07/11/10 at 9:30am on the 3rd floor at [facility], [address]  We ask that you try to arrive at our office 15 minutes prior to your appointment time to allow for check-in.  We would like you to complete the enclosed self-administered evaluation form prior to your visit and bring it with you on the day of your appointment.  We will review it with you.  Also, please bring a complete list of all your medications or, if you prefer, bring the medication bottles and we will list them.  Please bring your insurance card so that we may make a copy of it.  If your insurance requires a referral to see a specialist, please bring your referral form from your primary care physician.  Co-payments are due at the time of your visit and may be paid by cash, check or credit card.     Your office visit will consist of a consult with your physician (includes a physical exam), any laboratory testing he/she may order, scheduling of any necessary diagnostic testing (e.g. x-ray, ultrasound, CT-scan), and scheduling of a procedure (e.g. Endoscopy, Colonoscopy) if required.  Please allow enough time on your schedule to allow for any/all of these possibilities.    If you cannot keep your appointment, please call [phone] to cancel or reschedule prior to your appointment date.  This allows [place] the opportunity to schedule an appointment for another patient in need of care.  If you do not cancel or reschedule by 5 p.m. the business day prior to your appointment date, you will be charged a $50.00 late cancellation/no-show fee.    Thank you for  choosing [facility] for your medical needs.  We appreciate the opportunity to care for you.  Please visit [place] at our website  to learn more about our practice.                     Sincerely,                                                             The Gastroenterology Division

## 2010-10-22 NOTE — Progress Notes (Signed)
Summary: Cough, hemoptysis  Phone Note Call from Patient   Summary of Call: Malaise, maybe fever. sore throat. Has been coughng for 2-3 weeks c/w bronchitis. reports coughing up a nickle sized red blood today. Sore mid anterior chest.  Exam- No cervical adenopathy. tongue lightly coated/ no erythema or stridor. Quiet chest, no rhonchi or wheeze. Heart- WNL RRR, no murmur. Imp- Bronchitis. Plan- Avelox 40 mg x 5 days. CXR.    New/Updated Medications: AVELOX 400 MG TABS (MOXIFLOXACIN HCL) 1 daily Prescriptions: AVELOX 400 MG TABS (MOXIFLOXACIN HCL) 1 daily  #5 x 0   Entered and Authorized by:   Waymon Budge MD   Signed by:   Waymon Budge MD on 03/12/2010   Method used:   Samples Given   RxID:   [id number]

## 2010-10-22 NOTE — Letter (Signed)
Summary: [facility]   Imported By: Sherian Rein 11/20/2009 08:34:56  _____________________________________________________________________  External Attachment:    Type:   Image     Comment:   External Document

## 2010-10-22 NOTE — Letter (Signed)
Summary: Dorris Fetch MD  Dorris Fetch MD   Imported By: Lester Chapin 01/16/2010 10:38:26  _____________________________________________________________________  External Attachment:    Type:   Image     Comment:   External Document

## 2010-10-22 NOTE — Assessment & Plan Note (Signed)
Summary: Acute NP office visit - SOB, elevated HR   Primary Provider/Referring Provider:  Tresa Garter MD  CC:  increased SOB, elevated HR, and elevated BS to 400+ onset last night.  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 Roselyn Bering that has a known history of DM insulin dependent .    October 17, 2009--Presents for an acute office visit. Complains of 1 day of cold symptoms, nasal congesiton stuffy nose, tight feeling. Used xopenex inhaler w/ some relief. Sick on stomach w/ vomitting after breakfast this am. Daughter age 3 with similar symptoms. Reports BS 400 this am.  She has not called her primary or DM MD for these symptoms. Denies chest pain, orthopnea, hemoptysis, fever, n/v/d, edema, headache,recent travel or antibiotics.   Preventive Screening-Counseling & Management  Alcohol-Tobacco     Packs/Day: 0.5  Medications Prior to Update: 1)  Humalog 100 Unit/ml Soln (Insulin Lispro (Human)) .... Sliding Scale 2)  Lantus 100 Unit/ml Soln (Insulin Glargine) .... 36 Units Qpm 3)  Bc Pills .... Take 1 Tablet By Mouth Once A Day 4)  Alprazolam 0.5 Mg Tabs (Alprazolam) .Marland Kitchen.. 1 Two Times A Day As Needed 5)  Ibuprofen 600 Mg Tabs (Ibuprofen) .Marland Kitchen.. 1 By Mouth Bid  Pc X 1 Wk Then As Needed For  Pain 6)  Sumatriptan Succinate 100 Mg Tabs (Sumatriptan Succinate) .Marland Kitchen.. 1 By Mouth Once Daily As Needed Migraine 7)  Promethazine Hcl 25 Mg Tabs (Promethazine Hcl) .Marland Kitchen.. 1-2 By Mouth Four Times A Day As Needed Nausea 8)  Hydrocodone-Acetaminophen 5-325 Mg Tabs (Hydrocodone-Acetaminophen) .Marland Kitchen.. 1-2 By Mouth Two Times A Day As Needed Pain 9)  Ortho-Cyclen (28) 0.25-35 Mg-Mcg Tabs (Norgestimate-Eth Estradiol) .Marland Kitchen.. 1 Tab Once A Day  Current Medications (verified): 1)  Humalog 100 Unit/ml Soln (Insulin Lispro (Human)) .... Sliding Scale 2)  Lantus 100 Unit/ml Soln (Insulin Glargine) .... 38units At Bedtime 3)  Ibuprofen 600 Mg Tabs (Ibuprofen) .Marland Kitchen.. 1 By Mouth Bid   Pc X 1 Wk Then As Needed For  Pain 4)  Sumatriptan Succinate 100 Mg Tabs (Sumatriptan Succinate) .Marland Kitchen.. 1 By Mouth Once Daily As Needed Migraine 5)  Promethazine Hcl 25 Mg Tabs (Promethazine Hcl) .Marland Kitchen.. 1-2 By Mouth Four Times A Day As Needed Nausea 6)  Hydrocodone-Acetaminophen 5-325 Mg Tabs (Hydrocodone-Acetaminophen) .Marland Kitchen.. 1-2 By Mouth Two Times A Day As Needed Pain 7)  Ortho-Cyclen (28) 0.25-35 Mg-Mcg Tabs (Norgestimate-Eth Estradiol) .Marland Kitchen.. 1 Tab Once A Day  Allergies (verified): 1)  ! Phenergan (Promethazine Hcl) 2)  ! Darvocet A500 (Propoxyphene N-Apap) 3)  ! Avandia (Rosiglitazone Maleate) 4)  ! Sulfadiazine (Sulfadiazine) 5)  ! Penicillin V Potassium (Penicillin V Potassium) 6)  ! Ceclor 7)  ! Percocet 8)  ! Levaquin (Levofloxacin) 9)  ! * Latex  Past History:  Past Medical History: Last updated: 05/09/2008 TACHYCARDIA, PAROXYSMAL ATRIAL (ICD-427.0) FAMILY HISTORY DIABETES 1ST DEGREE RELATIVE (ICD-V18.0) SMOKER (ICD-305.1) MIGRAINES, HX OF (ICD-V13.8) CONTRACEPTIVE MANAGEMENT (ICD-V25.09) DIABETES MELLITUS, TYPE I (ICD-250.01), h/o DKA 2009 DEPRESSION (ICD-311) ASTHMA (ICD-493.90)    Past Surgical History: Last updated: 04/08/2007 diabetic ketoacidosis 01/2001 Cholecystectomy  Family History: Last updated: 05/09/2008 Family History Diabetes 1st degree relative Family History Hypertension  Social History: Last updated: 10/17/2009 smokes 1/2-1/4ppd. Works full time,   Married Alcohol use-no Current Smoker Drug use-no Regular exercise-yes  Risk Factors: Smoking Status: current (08/22/2009) Packs/Day: 0.5 (10/17/2009)  Social History: smokes 1/2-1/4ppd. Works full time,   Married Alcohol use-no Current Smoker Drug use-no Regular exercise-yes Packs/Day:  0.5  Review of Systems      See HPI  Vital Signs:  Patient profile:   30 year old female Height:      65 inches Weight:      204 pounds BMI:     34.07 O2 Sat:      99 % on Room air Temp:     97.5  degrees F oral Pulse rate:   113 / minute BP sitting:   116 / 82  (left arm) Cuff size:   regular  Vitals Entered By: Boone Master CNA (October 17, 2009 10:42 AM)  O2 Flow:  Room air CC: increased SOB, elevated HR, elevated BS to 400+ onset last night Is Patient Diabetic? Yes Comments Medications reviewed with patient Daytime contact number verified with patient. Boone Master CNA  October 17, 2009 10:42 AM    Physical Exam  Additional Exam:  GENERAL:  A/Ox3; pleasant & cooperative.NAD HEENT:  [place]/AT, EOM-wnl, PERRLA, EACs-clear, TMs-wnl, NOSE-pale NECK:  Supple w/ fair ROM; no JVD; normal carotid impulses w/o bruits; no thyromegaly or nodules palpated; no lymphadenopathy. CHEST:  Coarse Bs bilaterally HEART:  RRR, no m/r/g  heard ABDOMEN:  Soft & nt; nml bowel sounds; no organomegaly or masses detected EXT: Warm bilat,  no calf pain, edema, clubbing, pulses intact    Impression & Recommendations:  Problem # 1:  UPPER RESPIRATORY INFECTION, ACUTE (ICD-465.9)  Zpack to have on hold if symptoms worsen with discolored mucus Mucinex two times a day as needed cough/congesiton Nasonex 2 puffs two times a day  Saline nasal rinses as needed     Orders: Est. Patient Level IV (01027)  Problem # 2:  DIABETES W/KETOACIDOSIS TYPE I [JUV] UNCNTRL (ICD-250.13) BS elevated w/ illness w/ assoicated nausea pt is high risk, w/ multiple admissions w/ dehydration and DKA Dr. Sharl Ma office very nicely will see pt at 2 pm today.  Her updated medication list for this problem includes:    Humalog 100 Unit/ml Soln (Insulin lispro (human)) ..... Sliding scale    Lantus 100 Unit/ml Soln (Insulin glargine) .Marland KitchenMarland KitchenMarland KitchenMarland Kitchen 38units at bedtime  Medications Added to Medication List This Visit: 1)  Lantus 100 Unit/ml Soln (Insulin glargine) .... 38units at bedtime 2)  Zithromax Z-pak 250 Mg Tabs (Azithromycin) .... Take as directed.  Complete Medication List: 1)  Humalog 100 Unit/ml Soln (Insulin lispro  (human)) .... Sliding scale 2)  Lantus 100 Unit/ml Soln (Insulin glargine) .... 38units at bedtime 3)  Ibuprofen 600 Mg Tabs (Ibuprofen) .Marland Kitchen.. 1 by mouth bid  pc x 1 wk then as needed for  pain 4)  Sumatriptan Succinate 100 Mg Tabs (Sumatriptan succinate) .Marland Kitchen.. 1 by mouth once daily as needed migraine 5)  Promethazine Hcl 25 Mg Tabs (Promethazine hcl) .Marland Kitchen.. 1-2 by mouth four times a day as needed nausea 6)  Hydrocodone-acetaminophen 5-325 Mg Tabs (Hydrocodone-acetaminophen) .Marland Kitchen.. 1-2 by mouth two times a day as needed pain 7)  Zithromax Z-pak 250 Mg Tabs (Azithromycin) .... Take as directed.  Patient Instructions: 1)  Zpack to have on hold if symptoms worsen with discolored mucus 2)  Mucinex two times a day as needed cough/congesiton 3)  Nasonex 2 puffs two times a day  4)  Saline nasal rinses as needed  5)  Increae fluids  6)  We are referring you to Dr. Sharl Ma for your Diabetes today 7)  Please contact office for sooner follow up if symptoms do not improve or worsen  Prescriptions: ZITHROMAX Z-PAK 250 MG TABS (AZITHROMYCIN) take as directed.  #  1 x 0   Entered and Authorized by:   Rubye Oaks NP   Signed by:   Tammy Parrett NP on 10/17/2009   Method used:   Print then Give to Patient   RxID:   [id number]    Immunization History:  Influenza Immunization History:    Influenza:  historical (06/22/2009)  Pneumovax Immunization History:    Pneumovax:  historical (06/22/2006)   Appended Document: neb documentation    Clinical Lists Changes  Orders: Added new Service order of Nebulizer Tx (44034) - Signed

## 2010-10-22 NOTE — Progress Notes (Signed)
Summary: Avelox allergy  Phone Note Other Incoming   Summary of Call: Update- within 30 minutes after taking avelox, which she has tolerated before, she has develioped generalized pruritic rash. Given Allegra 180. holding dem=pomedrol due to her diabetes. Allergy file updated. Consider ER. Still pending result of CXR. CXR - WNL For pruritus, given benadryl 50 mg IM at 1210PM. Mother taking her home. Not wheezing.  Initial call taken by: Waymon Budge MD,  March 12, 2010 12:15 PM   New Allergies: ! * AVELOX New/Updated Medications: BENADRYL 50 MG/ML SOLN (DIPHENHYDRAMINE HCL) 50 mg given IM x 1 ALLEGRA 180 MG TABS (FEXOFENADINE HCL) 1 given po New Allergies: ! * AVELOXPrescriptions: ALLEGRA 180 MG TABS (FEXOFENADINE HCL) 1 given po  #1 x 0   Entered and Authorized by:   Waymon Budge MD   Signed by:   Waymon Budge MD on 03/12/2010   Method used:   Historical   RxID:   [id number] BENADRYL 50 MG/ML SOLN (DIPHENHYDRAMINE HCL) 50 mg given IM x 1  #50 mg x 0   Entered and Authorized by:   Waymon Budge MD   Signed by:   Waymon Budge MD on 03/12/2010   Method used:   Historical   RxID:   [id number]   Appended Document: Avelox allergy   Medication Administration  Injection # 1:    Medication: Benadryl  IM or IV    Diagnosis: COUGH (ICD-786.2)    Route: IM    Site: R deltoid    Exp Date: 12/23/2010    Lot #: [id number]    Mfr: Sander Radon plus    Patient tolerated injection without complications    Given by: Denna Haggard, CMA (March 12, 2010 12:27 PM)  Orders Added: 1)  Benadryl  IM or IV [J1200]

## 2010-10-22 NOTE — Miscellaneous (Signed)
Summary: Orders Update  Clinical Lists Changes  Orders: Added new Test order of T-Foot Right (73630TC) - Signed 

## 2010-10-22 NOTE — Progress Notes (Signed)
  Phone Note Call from Patient   Caller: Patient Call For: Tresa Garter MD Summary of Call: pt is requesting refill on birth control  Initial call taken by: Ami Bullins CMA,  October 08, 2009 3:27 PM    New/Updated Medications: ORTHO TRI-CYCLEN LO 0.18/0.215/0.25 MG-25 MCG TABS (NORGESTIM-ETH ESTRAD TRIPHASIC) 1 a day Prescriptions: ORTHO TRI-CYCLEN LO 0.18/0.215/0.25 MG-25 MCG TABS (NORGESTIM-ETH ESTRAD TRIPHASIC) 1 a day  #30 x 5   Entered by:   Ami Bullins CMA   Authorized by:   Tresa Garter MD   Signed by:   Bill Salinas CMA on 10/08/2009   Method used:   [facility] [address] [phone]* (retail)       [address]       [place], [place]  [address]       Ph: [phone] or [phone]       Fax: [phone]   RxID:   [id number]

## 2010-10-22 NOTE — Assessment & Plan Note (Signed)
Summary: ALLERGY CONSULT- SELF REFERRAL//kp   Vital Signs:  Patient profile:   30 year old female Height:      65 inches Weight:      207 pounds O2 Sat:      99 % on Room air Pulse rate:   120 / minute BP sitting:   104 / 62  (left arm) Cuff size:   regular  Vitals Entered By: Carver Fila (May 20, 2010 3:43 PM)  O2 Flow:  Room air CC: allergy new pt Is Patient Diabetic? Yes Comments meds and allergies updated Phone number updated Carver Fila  May 20, 2010 3:44 PM    Visit Type:  Initial Consult Primary Provider/Referring Provider:  C. White, MD  CC:  allergy new pt.  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 As child and adult has had nasal congestion and rhinorhea, ears stopped up. Seasonal, Spring worse than Fall. No definite asthma. Triggers- Seasonal. Wears mask mowing grass ( sneeze, itch, water) and dusting. Mother's car. perfumes. Exposure to allergy testing tray. Foods- ? corn husks. Chocolate, strawberries and tomatoes make tongue itch. OK with aspirin.  Latex- contact rash. Tried Singulair, fluticasone intermittently. Clarinex some help. Several medication allergies listed. Avoids prednisone due to diabetes.   Preventive Screening-Counseling & Management  Alcohol-Tobacco     Alcohol drinks/day: 0     Smoking Status: current     Smoking Cessation Counseling: yes     Packs/Day: 0.5     Year Started: 1999     Year Quit: 2009     Pack years: 12 years 1ppd     Tobacco Counseling: to quit use of tobacco products  Caffeine-Diet-Exercise     Does Patient Exercise: yes  Allergies: 1)  ! Phenergan (Promethazine Hcl) 2)  ! Darvocet A500 3)  ! Avandia (Rosiglitazone Maleate) 4)  ! Sulfadiazine (Sulfadiazine) 5)  ! Penicillin V Potassium (Penicillin V Potassium) 6)  ! Ceclor 7)  ! Percocet 8)  ! Levaquin (Levofloxacin) 9)  ! * Avelox 10)  ! * Latex  Past History:  Past Medical  History: TACHYCARDIA, PAROXYSMAL ATRIAL (ICD-427.0) FAMILY HISTORY DIABETES 1ST DEGREE RELATIVE (ICD-V18.0) SMOKER (ICD-305.1) MIGRAINES, HX OF (ICD-V13.8) CONTRACEPTIVE MANAGEMENT (ICD-V25.09) DIABETES MELLITUS, TYPE I (ICD-250.01), h/o DKA 2009, 2011 Recurrent folliculitis DEPRESSION (ICD-311) Allergic rhinitis    Past Surgical History: Cholecystectomy Tonsillectomy Benign breast lump 2000  Family History: Family History Diabetes 1st degree relative Family History Hypertension Breast Cancer-- mother Heart disease--GM, GF, UNCLE AND AUNTS Asthma--GM Allergies--Mother side of family Family History Emphysema--GM  Social History: Current Smoker. smokes 1/2 per 3 days.  Works full time,  [employer] Divorced lives with daughter and sister Alcohol use-no Drug use-no Regular exercise-yes  Review of Systems      See HPI       The patient complains of shortness of breath with activity, non-productive cough, irregular heartbeats, acid heartburn, indigestion, difficulty swallowing, sore throat, headaches, nasal congestion/difficulty breathing through nose, sneezing, itching, ear ache, depression, and hand/feet swelling.  The patient denies shortness of breath at rest, productive cough, coughing up blood, chest pain, loss of appetite, weight change, abdominal pain, tooth/dental problems, anxiety, joint stiffness or pain, rash, change in color of mucus, and fever.    Physical Exam  Additional Exam:  General: A/Ox3; pleasant, intelligent  and cooperative, NAD, SKIN: no rash, lesions NODES: no lymphadenopathy HEENT: [place]/AT, EOM- WNL, Conjuctivae- clear, PERRLA, TM-WNL, Nose- clear, Throat- clear and wnl, Mallampati  III NECK: Supple w/ fair ROM, JVD- none, normal carotid impulses w/o bruits Thyroid- normal to palpation CHEST: Clear to P&A HEART: RRR, no m/g/r heard ABDOMEN: overweight ZOX:WRUE, nl pulses, no edema  NEURO: Grossly intact to observation      Impression &  Recommendations:  Problem # 1:  ALLERGIC RHINITIS (ICD-477.9) Seasonal and perennial allergic rhinoconjunctivitis. There is some room to give oral antihistamines, nasal steroid and antihistamine sprays, and phenylephrine a good try. If insufficient we can evaluate for allergy vaccine. Need to work on avoidance of dust mites. May need work adjustment to remove from exposure to allergy testing materials if inhalation is causing symptoms. The following medications were removed from the medication list:    Promethazine Hcl 25 Mg Tabs (Promethazine hcl) .Marland Kitchen... 1-2 by mouth four times a day as needed nausea    Benadryl 50 Mg/ml Soln (Diphenhydramine hcl) .Marland KitchenMarland KitchenMarland KitchenMarland Kitchen 50 mg given im x 1    Allegra 180 Mg Tabs (Fexofenadine hcl) .Marland Kitchen... 1 given po  Medications Added to Medication List This Visit: 1)  Lantus 100 Unit/ml Soln (Insulin glargine) .... 50 units at bedtime  Other Orders: No Charge Patient Arrived (NCPA0) (NCPA0)  Patient Instructions: 1)  Please schedule a follow-up appointment as needed. 2)  Suggest a maintnenance antihistamine daily during your peak seasons or ahead of anticipated exposures 3)  allegra 60 or 180 4)  Claritin/ loratadine  or Clarinex 5)  You can add a decongestant otc- Sudafed PE/ phenylephrine for occasional use if your heart rate will tolerate it. 6)  nasal saline sprays and Breathe Right nasal strips may help   Immunization History:  Pneumovax Immunization History:    Pneumovax:  historical (03/22/2010)

## 2010-10-22 NOTE — Progress Notes (Signed)
Summary: REFERRAL  Phone Note Call from Patient Call back at Home Phone [phone]   Summary of Call: Pt c/o increase in migraines and she would like referral to neurologist.  Initial call taken by: Lamar Sprinkles, CMA,  November 08, 2009 8:38 AM  Follow-up for Phone Call        [facility] Dr Dr Chriss [facility] Follow-up by: Tresa Garter MD,  November 08, 2009 1:16 PM

## 2010-10-22 NOTE — Progress Notes (Signed)
       New/Updated Medications: ORTHO-CYCLEN (28) 0.25-35 MG-MCG TABS (NORGESTIMATE-ETH ESTRADIOL) 1 tab once a day Prescriptions: ORTHO-CYCLEN (28) 0.25-35 MG-MCG TABS (NORGESTIMATE-ETH ESTRADIOL) 1 tab once a day  #30 x 2   Entered by:   Ami Bullins CMA   Authorized by:   Tresa Garter MD   Signed by:   Bill Salinas CMA on 10/10/2009   Method used:   [facility]        [facility]  [address] [phone]* (retail)       [address]       [place], [place]  [address]       Ph: [phone] or [phone]       Fax: [phone]   RxID:   [id number]

## 2010-10-23 ENCOUNTER — Encounter: Payer: Self-pay | Admitting: Internal Medicine

## 2010-10-24 NOTE — Miscellaneous (Signed)
Summary: "Sinus headache" schedule  CT sinus  Clinical Lists Changes  Orders: Added new Referral order of Radiology Referral (Radiology) - Signed

## 2010-10-24 NOTE — Miscellaneous (Signed)
Summary: diflucan sent to the pharmacy  Clinical Lists Changes  Medications: Added new medication of FLUCONAZOLE 150 MG TABS (FLUCONAZOLE) take one daily - Signed Rx of FLUCONAZOLE 150 MG TABS (FLUCONAZOLE) take one daily;  #7 x 0;  Signed;  Entered by: Randell Loop CMA;  Authorized by: Waymon Budge MD;  Method used[facility] [address] [phone]*, [address] [place], [place], [place]  [address], Ph: [phone] or [phone], Fax: [phone]    Prescriptions: FLUCONAZOLE 150 MG TABS (FLUCONAZOLE) take one daily  #7 x 0   Entered by:   Randell Loop CMA   Authorized by:   Waymon Budge MD   Signed by:   Randell Loop CMA on 09/27/2010   Method used:   [facility]ronically to        [facility] [address] [phone]* (retail)       [address]       [place], [place]  [address]       Ph: [phone] or [phone]       Fax: [phone]   RxID:   [id number]

## 2010-12-04 ENCOUNTER — Ambulatory Visit (HOSPITAL_COMMUNITY)
Admission: RE | Admit: 2010-12-04 | Discharge: 2010-12-04 | Disposition: A | Discharge status: Home / Self Care | Payer: 59 | Source: Ambulatory Visit | Attending: Interventional Cardiology | Admitting: Interventional Cardiology

## 2010-12-04 DIAGNOSIS — R000 Tachycardia, unspecified: Secondary | ICD-10-CM | POA: Insufficient documentation

## 2010-12-04 DIAGNOSIS — E119 Type 2 diabetes mellitus without complications: Secondary | ICD-10-CM | POA: Insufficient documentation

## 2010-12-04 DIAGNOSIS — F17200 Nicotine dependence, unspecified, uncomplicated: Secondary | ICD-10-CM | POA: Insufficient documentation

## 2010-12-08 LAB — BASIC METABOLIC PANEL
BUN: 14 mg/dL (ref 6–23)
BUN: 19 mg/dL (ref 6–23)
BUN: 8 mg/dL (ref 6–23)
BUN: 9 mg/dL (ref 6–23)
CO2: 16 mEq/L — ABNORMAL LOW (ref 19–32)
CO2: 26 mEq/L (ref 19–32)
Chloride: 104 mEq/L (ref 96–112)
Chloride: 109 mEq/L (ref 96–112)
Chloride: 113 mEq/L — ABNORMAL HIGH (ref 96–112)
GFR calc Af Amer: >60 mL/min (ref >60)
GFR calc non Af Amer: 55 mL/min — ABNORMAL LOW (ref >60)
GFR calc non Af Amer: >60 mL/min (ref >60)
GFR calc non Af Amer: >60 mL/min (ref >60)
GFR calc non Af Amer: >60 mL/min (ref >60)
Glucose, Bld: 138 mg/dL — ABNORMAL HIGH (ref 70–99)
Glucose, Bld: 189 mg/dL — ABNORMAL HIGH (ref 70–99)
Glucose, Bld: 232 mg/dL — ABNORMAL HIGH (ref 70–99)
Glucose, Bld: 479 mg/dL — ABNORMAL HIGH (ref 70–99)
Potassium: 3.5 mEq/L (ref 3.5–5.1)
Potassium: 3.7 mEq/L (ref 3.5–5.1)
Potassium: 3.7 mEq/L (ref 3.5–5.1)
Potassium: 3.9 mEq/L (ref 3.5–5.1)
Potassium: 4.1 mEq/L (ref 3.5–5.1)
Sodium: 132 mEq/L — ABNORMAL LOW (ref 135–145)
Sodium: 138 mEq/L (ref 135–145)

## 2010-12-08 LAB — GLUCOSE, CAPILLARY
Glucose-Capillary: 132 mg/dL — ABNORMAL HIGH (ref 70–99)
Glucose-Capillary: 132 mg/dL — ABNORMAL HIGH (ref 70–99)
Glucose-Capillary: 135 mg/dL — ABNORMAL HIGH (ref 70–99)
Glucose-Capillary: 234 mg/dL — ABNORMAL HIGH (ref 70–99)
Glucose-Capillary: 251 mg/dL — ABNORMAL HIGH (ref 70–99)
Glucose-Capillary: 256 mg/dL — ABNORMAL HIGH (ref 70–99)

## 2010-12-08 LAB — URINALYSIS, ROUTINE W REFLEX MICROSCOPIC
Bilirubin Urine: NEGATIVE
Ketones, ur: >80 mg/dL — AB
Nitrite: NEGATIVE
Protein, ur: NEGATIVE mg/dL
Specific Gravity, Urine: 1.038 — ABNORMAL HIGH (ref 1.005–1.030)
Urobilinogen, UA: 0.2 mg/dL (ref 0.0–1.0)

## 2010-12-08 LAB — CARDIAC PANEL(CRET KIN+CKTOT+MB+TROPI)
CK, MB: 0.8 ng/mL (ref 0.3–4.0)
Relative Index: INVALID (ref 0.0–2.5)
Total CK: 45 U/L (ref 7–177)

## 2010-12-08 LAB — DIFFERENTIAL
Basophils Absolute: 0 10*3/uL (ref 0.0–0.1)
Basophils Relative: 1 % (ref 0–1)
Eosinophils Absolute: 0.2 10*3/uL (ref 0.0–0.7)
Eosinophils Relative: 2 % (ref 0–5)
Monocytes Absolute: 0.5 10*3/uL (ref 0.1–1.0)
Monocytes Relative: 5 % (ref 3–12)
Neutro Abs: 6.2 10*3/uL (ref 1.7–7.7)

## 2010-12-08 LAB — CBC
HCT: 36.8 % (ref 36.0–46.0)
HCT: 38 % (ref 36.0–46.0)
HCT: 43.7 % (ref 36.0–46.0)
Hemoglobin: 15.3 g/dL — ABNORMAL HIGH (ref 12.0–15.0)
MCH: 32.5 pg (ref 26.0–34.0)
MCH: 32.6 pg (ref 26.0–34.0)
MCHC: 34.8 g/dL (ref 30.0–36.0)
MCHC: 35.1 g/dL (ref 30.0–36.0)
MCV: 92.9 fL (ref 78.0–100.0)
MCV: 93.2 fL (ref 78.0–100.0)
Platelets: 283 10*3/uL (ref 150–400)
RBC: 4.11 MIL/uL (ref 3.87–5.11)
RDW: 12 % (ref 11.5–15.5)
RDW: 12.1 % (ref 11.5–15.5)
RDW: 12.1 % (ref 11.5–15.5)
WBC: 7.8 10*3/uL (ref 4.0–10.5)

## 2010-12-08 LAB — HEMOGLOBIN A1C: Hgb A1c MFr Bld: 13.1 % — ABNORMAL HIGH (ref <5.7)

## 2010-12-08 LAB — URINE CULTURE
Colony Count: NO GROWTH
Special Requests: NEGATIVE

## 2010-12-08 LAB — URINE MICROSCOPIC-ADD ON

## 2010-12-08 LAB — POCT PREGNANCY, URINE: Preg Test, Ur: NEGATIVE

## 2010-12-08 LAB — TSH: TSH: 1.159 u[IU]/mL (ref 0.350–4.500)

## 2010-12-08 LAB — CK TOTAL AND CKMB (NOT AT ARMC)
CK, MB: 0.8 ng/mL (ref 0.3–4.0)
Total CK: 41 U/L (ref 7–177)

## 2010-12-08 LAB — TROPONIN I: Troponin I: <0.01 ng/mL (ref 0.00–0.06)

## 2010-12-15 LAB — BASIC METABOLIC PANEL
BUN: 11 mg/dL (ref 6–23)
Calcium: 7.9 mg/dL — ABNORMAL LOW (ref 8.4–10.5)
Chloride: 109 mEq/L (ref 96–112)
Creatinine, Ser: 0.64 mg/dL (ref 0.4–1.2)
Creatinine, Ser: 0.66 mg/dL (ref 0.4–1.2)
GFR calc non Af Amer: >60 mL/min (ref >60)
GFR calc non Af Amer: >60 mL/min (ref >60)
Glucose, Bld: 137 mg/dL — ABNORMAL HIGH (ref 70–99)
Glucose, Bld: 144 mg/dL — ABNORMAL HIGH (ref 70–99)
Potassium: 3.5 mEq/L (ref 3.5–5.1)
Sodium: 135 mEq/L (ref 135–145)

## 2010-12-15 LAB — HEMOGLOBIN A1C: Hgb A1c MFr Bld: 11.9 % — ABNORMAL HIGH (ref 4.6–6.1)

## 2010-12-15 LAB — GLUCOSE, CAPILLARY
Glucose-Capillary: 113 mg/dL — ABNORMAL HIGH (ref 70–99)
Glucose-Capillary: 131 mg/dL — ABNORMAL HIGH (ref 70–99)
Glucose-Capillary: 132 mg/dL — ABNORMAL HIGH (ref 70–99)
Glucose-Capillary: 151 mg/dL — ABNORMAL HIGH (ref 70–99)
Glucose-Capillary: 160 mg/dL — ABNORMAL HIGH (ref 70–99)
Glucose-Capillary: 162 mg/dL — ABNORMAL HIGH (ref 70–99)
Glucose-Capillary: 168 mg/dL — ABNORMAL HIGH (ref 70–99)
Glucose-Capillary: 201 mg/dL — ABNORMAL HIGH (ref 70–99)
Glucose-Capillary: 248 mg/dL — ABNORMAL HIGH (ref 70–99)
Glucose-Capillary: 269 mg/dL — ABNORMAL HIGH (ref 70–99)

## 2010-12-15 LAB — URINALYSIS, ROUTINE W REFLEX MICROSCOPIC
Glucose, UA: 500 mg/dL — AB
Hgb urine dipstick: NEGATIVE
Protein, ur: >300 mg/dL — AB
Specific Gravity, Urine: 1.033 — ABNORMAL HIGH (ref 1.005–1.030)
Urobilinogen, UA: 1 mg/dL (ref 0.0–1.0)

## 2010-12-15 LAB — DIFFERENTIAL
Basophils Relative: 1 % (ref 0–1)
Eosinophils Absolute: 0.2 10*3/uL (ref 0.0–0.7)
Monocytes Absolute: 0.8 10*3/uL (ref 0.1–1.0)
Monocytes Relative: 6 % (ref 3–12)

## 2010-12-15 LAB — URINE MICROSCOPIC-ADD ON

## 2010-12-15 LAB — COMPREHENSIVE METABOLIC PANEL
ALT: 10 U/L (ref 0–35)
Alkaline Phosphatase: 69 U/L (ref 39–117)
BUN: 12 mg/dL (ref 6–23)
BUN: 17 mg/dL (ref 6–23)
CO2: 13 mEq/L — ABNORMAL LOW (ref 19–32)
CO2: 18 mEq/L — ABNORMAL LOW (ref 19–32)
Calcium: 9 mg/dL (ref 8.4–10.5)
Chloride: 111 mEq/L (ref 96–112)
Creatinine, Ser: 0.86 mg/dL (ref 0.4–1.2)
GFR calc non Af Amer: >60 mL/min (ref >60)
GFR calc non Af Amer: >60 mL/min (ref >60)
Glucose, Bld: 102 mg/dL — ABNORMAL HIGH (ref 70–99)
Glucose, Bld: 138 mg/dL — ABNORMAL HIGH (ref 70–99)
Potassium: 3.3 mEq/L — ABNORMAL LOW (ref 3.5–5.1)
Sodium: 136 mEq/L (ref 135–145)
Sodium: 138 mEq/L (ref 135–145)
Total Bilirubin: 0.8 mg/dL (ref 0.3–1.2)
Total Protein: 6.9 g/dL (ref 6.0–8.3)

## 2010-12-15 LAB — CBC
HCT: 41.6 % (ref 36.0–46.0)
HCT: 49 % — ABNORMAL HIGH (ref 36.0–46.0)
Hemoglobin: 13.8 g/dL (ref 12.0–15.0)
Hemoglobin: 16.7 g/dL — ABNORMAL HIGH (ref 12.0–15.0)
MCHC: 34 g/dL (ref 30.0–36.0)
MCV: 94 fL (ref 78.0–100.0)
MCV: 96.1 fL (ref 78.0–100.0)
Platelets: 303 10*3/uL (ref 150–400)
RBC: 5.21 MIL/uL — ABNORMAL HIGH (ref 3.87–5.11)
RDW: 13.1 % (ref 11.5–15.5)

## 2010-12-15 LAB — POCT I-STAT, CHEM 8
Glucose, Bld: 174 mg/dL — ABNORMAL HIGH (ref 70–99)
HCT: 53 % — ABNORMAL HIGH (ref 36.0–46.0)
Hemoglobin: 18 g/dL — ABNORMAL HIGH (ref 12.0–15.0)
Potassium: 6 mEq/L — ABNORMAL HIGH (ref 3.5–5.1)
TCO2: 12 mmol/L (ref 0–100)

## 2010-12-15 LAB — BLOOD GAS, VENOUS
Drawn by: 290081
O2 Saturation: 92.4 %
Patient temperature: 98.6
pO2, Ven: 62.7 mmHg — ABNORMAL HIGH (ref 30.0–45.0)

## 2010-12-15 LAB — MRSA PCR SCREENING: MRSA by PCR: NEGATIVE

## 2010-12-25 LAB — BASIC METABOLIC PANEL
BUN: 1 mg/dL — ABNORMAL LOW (ref 6–23)
BUN: 13 mg/dL (ref 6–23)
BUN: 2 mg/dL — ABNORMAL LOW (ref 6–23)
BUN: 4 mg/dL — ABNORMAL LOW (ref 6–23)
BUN: 4 mg/dL — ABNORMAL LOW (ref 6–23)
BUN: 6 mg/dL (ref 6–23)
BUN: 6 mg/dL (ref 6–23)
BUN: 7 mg/dL (ref 6–23)
BUN: 8 mg/dL (ref 6–23)
BUN: 9 mg/dL (ref 6–23)
BUN: 9 mg/dL (ref 6–23)
CO2: 14 mEq/L — ABNORMAL LOW (ref 19–32)
CO2: 15 mEq/L — ABNORMAL LOW (ref 19–32)
CO2: 15 mEq/L — ABNORMAL LOW (ref 19–32)
CO2: 16 mEq/L — ABNORMAL LOW (ref 19–32)
CO2: 16 mEq/L — ABNORMAL LOW (ref 19–32)
CO2: 16 mEq/L — ABNORMAL LOW (ref 19–32)
CO2: 18 mEq/L — ABNORMAL LOW (ref 19–32)
CO2: 21 mEq/L (ref 19–32)
CO2: 6 mEq/L — CL (ref 19–32)
Calcium: 7.4 mg/dL — ABNORMAL LOW (ref 8.4–10.5)
Calcium: 7.6 mg/dL — ABNORMAL LOW (ref 8.4–10.5)
Calcium: 7.8 mg/dL — ABNORMAL LOW (ref 8.4–10.5)
Calcium: 8 mg/dL — ABNORMAL LOW (ref 8.4–10.5)
Calcium: 8.1 mg/dL — ABNORMAL LOW (ref 8.4–10.5)
Calcium: 8.4 mg/dL (ref 8.4–10.5)
Calcium: 8.7 mg/dL (ref 8.4–10.5)
Chloride: 111 mEq/L (ref 96–112)
Chloride: 113 mEq/L — ABNORMAL HIGH (ref 96–112)
Chloride: 114 mEq/L — ABNORMAL HIGH (ref 96–112)
Chloride: 114 mEq/L — ABNORMAL HIGH (ref 96–112)
Chloride: 115 mEq/L — ABNORMAL HIGH (ref 96–112)
Chloride: 117 mEq/L — ABNORMAL HIGH (ref 96–112)
Chloride: 118 mEq/L — ABNORMAL HIGH (ref 96–112)
Chloride: 120 mEq/L — ABNORMAL HIGH (ref 96–112)
Chloride: 123 mEq/L — ABNORMAL HIGH (ref 96–112)
Creatinine, Ser: 0.45 mg/dL (ref 0.4–1.2)
Creatinine, Ser: 0.52 mg/dL (ref 0.4–1.2)
Creatinine, Ser: 0.56 mg/dL (ref 0.4–1.2)
Creatinine, Ser: 0.59 mg/dL (ref 0.4–1.2)
Creatinine, Ser: 0.61 mg/dL (ref 0.4–1.2)
Creatinine, Ser: 0.62 mg/dL (ref 0.4–1.2)
Creatinine, Ser: 0.66 mg/dL (ref 0.4–1.2)
Creatinine, Ser: 0.73 mg/dL (ref 0.4–1.2)
Creatinine, Ser: 0.78 mg/dL (ref 0.4–1.2)
Creatinine, Ser: 0.81 mg/dL (ref 0.4–1.2)
Creatinine, Ser: 1.18 mg/dL (ref 0.4–1.2)
GFR calc Af Amer: >60 mL/min (ref >60)
GFR calc Af Amer: >60 mL/min (ref >60)
GFR calc Af Amer: >60 mL/min (ref >60)
GFR calc Af Amer: >60 mL/min (ref >60)
GFR calc Af Amer: >60 mL/min (ref >60)
GFR calc Af Amer: >60 mL/min (ref >60)
GFR calc Af Amer: >60 mL/min (ref >60)
GFR calc non Af Amer: >60 mL/min (ref >60)
GFR calc non Af Amer: >60 mL/min (ref >60)
GFR calc non Af Amer: >60 mL/min (ref >60)
GFR calc non Af Amer: >60 mL/min (ref >60)
GFR calc non Af Amer: >60 mL/min (ref >60)
GFR calc non Af Amer: >60 mL/min (ref >60)
GFR calc non Af Amer: >60 mL/min (ref >60)
GFR calc non Af Amer: >60 mL/min (ref >60)
GFR calc non Af Amer: >60 mL/min (ref >60)
Glucose, Bld: 102 mg/dL — ABNORMAL HIGH (ref 70–99)
Glucose, Bld: 126 mg/dL — ABNORMAL HIGH (ref 70–99)
Glucose, Bld: 127 mg/dL — ABNORMAL HIGH (ref 70–99)
Glucose, Bld: 127 mg/dL — ABNORMAL HIGH (ref 70–99)
Glucose, Bld: 142 mg/dL — ABNORMAL HIGH (ref 70–99)
Glucose, Bld: 148 mg/dL — ABNORMAL HIGH (ref 70–99)
Glucose, Bld: 213 mg/dL — ABNORMAL HIGH (ref 70–99)
Glucose, Bld: 227 mg/dL — ABNORMAL HIGH (ref 70–99)
Glucose, Bld: 229 mg/dL — ABNORMAL HIGH (ref 70–99)
Glucose, Bld: 86 mg/dL (ref 70–99)
Potassium: 3 mEq/L — ABNORMAL LOW (ref 3.5–5.1)
Potassium: 3 mEq/L — ABNORMAL LOW (ref 3.5–5.1)
Potassium: 3.2 mEq/L — ABNORMAL LOW (ref 3.5–5.1)
Potassium: 3.3 mEq/L — ABNORMAL LOW (ref 3.5–5.1)
Potassium: 3.4 mEq/L — ABNORMAL LOW (ref 3.5–5.1)
Potassium: 3.5 mEq/L (ref 3.5–5.1)
Sodium: 134 mEq/L — ABNORMAL LOW (ref 135–145)
Sodium: 136 mEq/L (ref 135–145)
Sodium: 139 mEq/L (ref 135–145)
Sodium: 142 mEq/L (ref 135–145)

## 2010-12-25 LAB — BLOOD GAS, ARTERIAL
Acid-base deficit: 11.4 mmol/L — ABNORMAL HIGH (ref 0.0–2.0)
Bicarbonate: 12.6 mEq/L — ABNORMAL LOW (ref 20.0–24.0)
Bicarbonate: 13 mEq/L — ABNORMAL LOW (ref 20.0–24.0)
Drawn by: 277331
FIO2: 0.21 %
O2 Content: 2 L/min
TCO2: 13.7 mmol/L (ref 0–100)
pCO2 arterial: 17.4 mmHg — CL (ref 35.0–45.0)
pCO2 arterial: 23.7 mmHg — ABNORMAL LOW (ref 35.0–45.0)
pCO2 arterial: 24.5 mmHg — ABNORMAL LOW (ref 35.0–45.0)
pH, Arterial: 7.333 — ABNORMAL LOW (ref 7.350–7.400)
pH, Arterial: 7.358 (ref 7.350–7.400)
pO2, Arterial: 127 mmHg — ABNORMAL HIGH (ref 80.0–100.0)
pO2, Arterial: 128 mmHg — ABNORMAL HIGH (ref 80.0–100.0)
pO2, Arterial: 143 mmHg — ABNORMAL HIGH (ref 80.0–100.0)

## 2010-12-25 LAB — PHOSPHORUS
Phosphorus: 1.4 mg/dL — ABNORMAL LOW (ref 2.3–4.6)
Phosphorus: 1.4 mg/dL — ABNORMAL LOW (ref 2.3–4.6)

## 2010-12-25 LAB — GLUCOSE, CAPILLARY
Glucose-Capillary: 120 mg/dL — ABNORMAL HIGH (ref 70–99)
Glucose-Capillary: 120 mg/dL — ABNORMAL HIGH (ref 70–99)
Glucose-Capillary: 133 mg/dL — ABNORMAL HIGH (ref 70–99)
Glucose-Capillary: 144 mg/dL — ABNORMAL HIGH (ref 70–99)
Glucose-Capillary: 144 mg/dL — ABNORMAL HIGH (ref 70–99)
Glucose-Capillary: 152 mg/dL — ABNORMAL HIGH (ref 70–99)
Glucose-Capillary: 158 mg/dL — ABNORMAL HIGH (ref 70–99)
Glucose-Capillary: 159 mg/dL — ABNORMAL HIGH (ref 70–99)
Glucose-Capillary: 160 mg/dL — ABNORMAL HIGH (ref 70–99)
Glucose-Capillary: 160 mg/dL — ABNORMAL HIGH (ref 70–99)
Glucose-Capillary: 168 mg/dL — ABNORMAL HIGH (ref 70–99)
Glucose-Capillary: 169 mg/dL — ABNORMAL HIGH (ref 70–99)
Glucose-Capillary: 181 mg/dL — ABNORMAL HIGH (ref 70–99)
Glucose-Capillary: 183 mg/dL — ABNORMAL HIGH (ref 70–99)
Glucose-Capillary: 185 mg/dL — ABNORMAL HIGH (ref 70–99)
Glucose-Capillary: 185 mg/dL — ABNORMAL HIGH (ref 70–99)
Glucose-Capillary: 195 mg/dL — ABNORMAL HIGH (ref 70–99)
Glucose-Capillary: 214 mg/dL — ABNORMAL HIGH (ref 70–99)
Glucose-Capillary: 233 mg/dL — ABNORMAL HIGH (ref 70–99)
Glucose-Capillary: 260 mg/dL — ABNORMAL HIGH (ref 70–99)
Glucose-Capillary: 68 mg/dL — ABNORMAL LOW (ref 70–99)
Glucose-Capillary: 77 mg/dL (ref 70–99)
Glucose-Capillary: 96 mg/dL (ref 70–99)

## 2010-12-25 LAB — URINALYSIS, ROUTINE W REFLEX MICROSCOPIC
Nitrite: NEGATIVE
Specific Gravity, Urine: 1.024 (ref 1.005–1.030)
pH: 6 (ref 5.0–8.0)

## 2010-12-25 LAB — URINE MICROSCOPIC-ADD ON

## 2010-12-25 LAB — MAGNESIUM
Magnesium: 1.5 mg/dL (ref 1.5–2.5)
Magnesium: 1.8 mg/dL (ref 1.5–2.5)
Magnesium: 1.8 mg/dL (ref 1.5–2.5)
Magnesium: 2 mg/dL (ref 1.5–2.5)

## 2010-12-25 LAB — CARDIAC PANEL(CRET KIN+CKTOT+MB+TROPI)
CK, MB: 1.6 ng/mL (ref 0.3–4.0)
CK, MB: 1.7 ng/mL (ref 0.3–4.0)
Troponin I: 0.02 ng/mL (ref 0.00–0.06)
Troponin I: 0.02 ng/mL (ref 0.00–0.06)

## 2010-12-25 LAB — CULTURE, BLOOD (ROUTINE X 2): Culture: NO GROWTH

## 2010-12-25 LAB — HEMOGLOBIN A1C: Hgb A1c MFr Bld: 10.8 % — ABNORMAL HIGH (ref 4.6–6.1)

## 2010-12-25 LAB — CBC
HCT: 33.9 % — ABNORMAL LOW (ref 36.0–46.0)
MCHC: 33.8 g/dL (ref 30.0–36.0)
MCV: 95 fL (ref 78.0–100.0)
MCV: 97 fL (ref 78.0–100.0)
Platelets: 237 10*3/uL (ref 150–400)
Platelets: 425 10*3/uL — ABNORMAL HIGH (ref 150–400)
RDW: 13.9 % (ref 11.5–15.5)

## 2010-12-25 LAB — PREGNANCY, URINE: Preg Test, Ur: NEGATIVE

## 2010-12-26 LAB — BASIC METABOLIC PANEL
BUN: 17 mg/dL (ref 6–23)
Chloride: 111 mEq/L (ref 96–112)
Creatinine, Ser: 1.34 mg/dL — ABNORMAL HIGH (ref 0.4–1.2)
GFR calc Af Amer: 57 mL/min — ABNORMAL LOW (ref >60)
GFR calc non Af Amer: 47 mL/min — ABNORMAL LOW (ref >60)
Potassium: 6.7 mEq/L (ref 3.5–5.1)

## 2010-12-26 LAB — COMPREHENSIVE METABOLIC PANEL
ALT: 18 U/L (ref 0–35)
AST: 17 U/L (ref 0–37)
Albumin: 4.1 g/dL (ref 3.5–5.2)
CO2: 6 mEq/L — CL (ref 19–32)
Calcium: 8.8 mg/dL (ref 8.4–10.5)
GFR calc Af Amer: 54 mL/min — ABNORMAL LOW (ref >60)
GFR calc non Af Amer: 45 mL/min — ABNORMAL LOW (ref >60)
Sodium: 137 mEq/L (ref 135–145)
Total Protein: 7.8 g/dL (ref 6.0–8.3)

## 2010-12-26 LAB — DIFFERENTIAL
Eosinophils Relative: 1 % (ref 0–5)
Lymphocytes Relative: 15 % (ref 12–46)
Lymphs Abs: 2.2 10*3/uL (ref 0.7–4.0)
Monocytes Absolute: 0.4 10*3/uL (ref 0.1–1.0)

## 2010-12-26 LAB — POCT CARDIAC MARKERS
Myoglobin, poc: 46.8 ng/mL (ref 12–200)
Troponin i, poc: <0.05 ng/mL (ref 0.00–0.09)

## 2010-12-26 LAB — URINE CULTURE

## 2010-12-26 LAB — URINALYSIS, ROUTINE W REFLEX MICROSCOPIC
Glucose, UA: >1000 mg/dL — AB
Leukocytes, UA: NEGATIVE
Nitrite: NEGATIVE
Specific Gravity, Urine: 1.023 (ref 1.005–1.030)
pH: 5 (ref 5.0–8.0)

## 2010-12-26 LAB — CBC
HCT: 46.5 % — ABNORMAL HIGH (ref 36.0–46.0)
Hemoglobin: 16 g/dL — ABNORMAL HIGH (ref 12.0–15.0)
Platelets: 437 10*3/uL — ABNORMAL HIGH (ref 150–400)
WBC: 14.9 10*3/uL — ABNORMAL HIGH (ref 4.0–10.5)

## 2010-12-26 LAB — POCT I-STAT 3, VENOUS BLOOD GAS (G3P V)
Bicarbonate: 5.5 mEq/L — ABNORMAL LOW (ref 20.0–24.0)
TCO2: 6 mmol/L (ref 0–100)
pH, Ven: 7.054 — CL (ref 7.250–7.300)
pO2, Ven: 63 mmHg — ABNORMAL HIGH (ref 30.0–45.0)

## 2010-12-26 LAB — KETONES, QUALITATIVE

## 2010-12-26 LAB — PHOSPHORUS: Phosphorus: 5.3 mg/dL — ABNORMAL HIGH (ref 2.3–4.6)

## 2010-12-26 LAB — GLUCOSE, CAPILLARY
Glucose-Capillary: 359 mg/dL — ABNORMAL HIGH (ref 70–99)
Glucose-Capillary: 409 mg/dL — ABNORMAL HIGH (ref 70–99)

## 2010-12-26 LAB — URINE MICROSCOPIC-ADD ON

## 2010-12-26 LAB — CARDIAC PANEL(CRET KIN+CKTOT+MB+TROPI)
CK, MB: 1.9 ng/mL (ref 0.3–4.0)
Relative Index: INVALID (ref 0.0–2.5)
Troponin I: 0.02 ng/mL (ref 0.00–0.06)

## 2010-12-26 LAB — HOMOCYSTEINE: Homocysteine: 6.4 umol/L (ref 4.0–15.4)

## 2010-12-29 LAB — CBC
MCHC: 34.8 g/dL (ref 30.0–36.0)
MCV: 93.4 fL (ref 78.0–100.0)
Platelets: 159 10*3/uL (ref 150–400)
RBC: 4.09 MIL/uL (ref 3.87–5.11)
WBC: 10.9 10*3/uL — ABNORMAL HIGH (ref 4.0–10.5)

## 2010-12-29 LAB — GLUCOSE, CAPILLARY
Glucose-Capillary: 129 mg/dL — ABNORMAL HIGH (ref 70–99)
Glucose-Capillary: 53 mg/dL — ABNORMAL LOW (ref 70–99)
Glucose-Capillary: 73 mg/dL (ref 70–99)
Glucose-Capillary: 102 mg/dL — ABNORMAL HIGH (ref 70–99)
Glucose-Capillary: 111 mg/dL — ABNORMAL HIGH (ref 70–99)
Glucose-Capillary: 149 mg/dL — ABNORMAL HIGH (ref 70–99)
Glucose-Capillary: 179 mg/dL — ABNORMAL HIGH (ref 70–99)
Glucose-Capillary: 44 mg/dL — ABNORMAL LOW (ref 70–99)
Glucose-Capillary: 46 mg/dL — ABNORMAL LOW (ref 70–99)
Glucose-Capillary: 59 mg/dL — ABNORMAL LOW (ref 70–99)
Glucose-Capillary: 64 mg/dL — ABNORMAL LOW (ref 70–99)
Glucose-Capillary: 82 mg/dL (ref 70–99)

## 2011-01-01 LAB — GLUCOSE, CAPILLARY
Glucose-Capillary: 119 mg/dL — ABNORMAL HIGH (ref 70–99)
Glucose-Capillary: 37 mg/dL — CL (ref 70–99)
Glucose-Capillary: 61 mg/dL — ABNORMAL LOW (ref 70–99)

## 2011-01-02 LAB — URINALYSIS, ROUTINE W REFLEX MICROSCOPIC
Bilirubin Urine: NEGATIVE
Glucose, UA: >1000 mg/dL — AB
Protein, ur: NEGATIVE mg/dL
Specific Gravity, Urine: 1.01 (ref 1.005–1.030)
Specific Gravity, Urine: 1.025 (ref 1.005–1.030)
Urobilinogen, UA: 0.2 mg/dL (ref 0.0–1.0)
pH: 6.5 (ref 5.0–8.0)

## 2011-01-02 LAB — DIFFERENTIAL
Basophils Relative: 1 % (ref 0–1)
Eosinophils Absolute: 0.2 10*3/uL (ref 0.0–0.7)
Lymphs Abs: 3.4 10*3/uL (ref 0.7–4.0)
Neutro Abs: 6.8 10*3/uL (ref 1.7–7.7)
Neutrophils Relative %: 62 % (ref 43–77)

## 2011-01-02 LAB — COMPREHENSIVE METABOLIC PANEL
ALT: 11 U/L (ref 0–35)
BUN: 13 mg/dL (ref 6–23)
CO2: 21 mEq/L (ref 19–32)
Calcium: 9.3 mg/dL (ref 8.4–10.5)
GFR calc non Af Amer: >60 mL/min (ref >60)
Glucose, Bld: 257 mg/dL — ABNORMAL HIGH (ref 70–99)
Sodium: 134 mEq/L — ABNORMAL LOW (ref 135–145)

## 2011-01-02 LAB — CBC
HCT: 37.9 % (ref 36.0–46.0)
Hemoglobin: 12.9 g/dL (ref 12.0–15.0)
MCHC: 34 g/dL (ref 30.0–36.0)
MCV: 94.3 fL (ref 78.0–100.0)
RBC: 4.02 MIL/uL (ref 3.87–5.11)
RDW: 12.4 % (ref 11.5–15.5)

## 2011-01-02 LAB — GC/CHLAMYDIA PROBE AMP, GENITAL
Chlamydia, DNA Probe: NEGATIVE
Chlamydia, DNA Probe: NEGATIVE
GC Probe Amp, Genital: NEGATIVE
GC Probe Amp, Genital: NEGATIVE

## 2011-01-02 LAB — URINE MICROSCOPIC-ADD ON

## 2011-01-02 LAB — WET PREP, GENITAL

## 2011-01-07 LAB — COMPREHENSIVE METABOLIC PANEL
ALT: 11 U/L (ref 0–35)
Alkaline Phosphatase: 51 U/L (ref 39–117)
CO2: 22 mEq/L (ref 19–32)
Chloride: 106 mEq/L (ref 96–112)
GFR calc non Af Amer: >60 mL/min (ref >60)
Glucose, Bld: 183 mg/dL — ABNORMAL HIGH (ref 70–99)
Potassium: 3.4 mEq/L — ABNORMAL LOW (ref 3.5–5.1)
Sodium: 133 mEq/L — ABNORMAL LOW (ref 135–145)
Total Bilirubin: 0.5 mg/dL (ref 0.3–1.2)

## 2011-01-07 LAB — GLUCOSE, CAPILLARY
Glucose-Capillary: 164 mg/dL — ABNORMAL HIGH (ref 70–99)
Glucose-Capillary: 226 mg/dL — ABNORMAL HIGH (ref 70–99)
Glucose-Capillary: 48 mg/dL — ABNORMAL LOW (ref 70–99)
Glucose-Capillary: 68 mg/dL — ABNORMAL LOW (ref 70–99)
Glucose-Capillary: 78 mg/dL (ref 70–99)

## 2011-01-07 LAB — DIFFERENTIAL
Eosinophils Relative: 1 % (ref 0–5)
Lymphocytes Relative: 20 % (ref 12–46)
Lymphs Abs: 2.3 10*3/uL (ref 0.7–4.0)

## 2011-01-07 LAB — POCT I-STAT, CHEM 8
BUN: 8 mg/dL (ref 6–23)
Chloride: 105 mEq/L (ref 96–112)
Creatinine, Ser: 0.7 mg/dL (ref 0.4–1.2)
Potassium: 3.5 mEq/L (ref 3.5–5.1)
Sodium: 138 mEq/L (ref 135–145)
TCO2: 23 mmol/L (ref 0–100)

## 2011-01-07 LAB — CBC
HCT: 41 % (ref 36.0–46.0)
Platelets: 310 10*3/uL (ref 150–400)
WBC: 11.5 10*3/uL — ABNORMAL HIGH (ref 4.0–10.5)

## 2011-01-07 LAB — URINALYSIS, ROUTINE W REFLEX MICROSCOPIC
Bilirubin Urine: NEGATIVE
Protein, ur: >300 mg/dL — AB
Specific Gravity, Urine: 1.041 — ABNORMAL HIGH (ref 1.005–1.030)
Urobilinogen, UA: 0.2 mg/dL (ref 0.0–1.0)

## 2011-01-07 LAB — URINE MICROSCOPIC-ADD ON

## 2011-01-08 ENCOUNTER — Other Ambulatory Visit (HOSPITAL_COMMUNITY)
Admission: RE | Admit: 2011-01-08 | Discharge: 2011-01-08 | Disposition: A | Discharge status: Home / Self Care | Payer: 59 | Source: Ambulatory Visit | Attending: Family Medicine | Admitting: Family Medicine

## 2011-01-08 ENCOUNTER — Other Ambulatory Visit: Payer: Self-pay | Admitting: Family Medicine

## 2011-01-08 DIAGNOSIS — R87619 Unspecified abnormal cytological findings in specimens from cervix uteri: Secondary | ICD-10-CM | POA: Insufficient documentation

## 2011-01-08 DIAGNOSIS — Z113 Encounter for screening for infections with a predominantly sexual mode of transmission: Secondary | ICD-10-CM | POA: Insufficient documentation

## 2011-01-10 ENCOUNTER — Inpatient Hospital Stay (INDEPENDENT_AMBULATORY_CARE_PROVIDER_SITE_OTHER)
Admission: RE | Admit: 2011-01-10 | Discharge: 2011-01-10 | Disposition: A | Discharge status: Home / Self Care | Payer: 59 | Source: Ambulatory Visit | Attending: Family Medicine | Admitting: Family Medicine

## 2011-01-10 DIAGNOSIS — L02419 Cutaneous abscess of limb, unspecified: Secondary | ICD-10-CM

## 2011-01-13 LAB — CULTURE, ROUTINE-ABSCESS

## 2011-01-16 ENCOUNTER — Emergency Department (HOSPITAL_COMMUNITY)
Admission: EM | Admit: 2011-01-16 | Discharge: 2011-01-16 | Disposition: A | Discharge status: Home / Self Care | Payer: 59 | Attending: Emergency Medicine | Admitting: Emergency Medicine

## 2011-01-16 DIAGNOSIS — R079 Chest pain, unspecified: Secondary | ICD-10-CM | POA: Insufficient documentation

## 2011-01-16 DIAGNOSIS — Z794 Long term (current) use of insulin: Secondary | ICD-10-CM | POA: Insufficient documentation

## 2011-01-16 DIAGNOSIS — G43909 Migraine, unspecified, not intractable, without status migrainosus: Secondary | ICD-10-CM | POA: Insufficient documentation

## 2011-01-16 DIAGNOSIS — E109 Type 1 diabetes mellitus without complications: Secondary | ICD-10-CM | POA: Insufficient documentation

## 2011-01-16 LAB — GLUCOSE, CAPILLARY: Glucose-Capillary: 206 mg/dL — ABNORMAL HIGH (ref 70–99)

## 2011-02-04 NOTE — Discharge Summary (Signed)
NAMEDENYSE, FILLION NO.:  [id number]   MEDICAL RECORD NO.:  [id number]          PATIENT TYPE:  INP   LOCATION:  9309                          FACILITY:  [facility]   PHYSICIAN:  Malachi Pro. Ambrose Mantle, M.D. DATE OF BIRTH:  [birth year]   DATE OF ADMISSION:  04/04/2009  DATE OF DISCHARGE:  04/06/2009                               DISCHARGE SUMMARY   HISTORY:  This is a 30 year old white separated female, para 0-0-3-0,  gravida 4, EDC on June 07, 2009, admitted in active labor at 5-cm  dilatation.  The patient has type 1 diabetes and has been diabetic since  age 43.  Blood group and type A positive, negative antibody, nonreactive  serology, rubella immune, hepatitis B surface antigen negative, and HIV  negative.  Hemoglobin A1c 9.9, GC and chlamydia negative, positive group  B strep, AFP positive, open spine and birth risk 1 and 63, and 24-hour  urine for protein on December 28, 2008, 590 mg.  Down syndrome and trisomy  18 screens negative.  Vaginal ultrasound on October 26, 2008, 8 weeks  and 1 day.  EDC on June 07, 2009.  Positive urine culture was  treated with Keflex.  The patient is allergic to PENICILLIN, but she  tolerate Keflex well.  She has been diabetic since age 12.  She was  quite delayed and following Dr. Berenda Morale instructions for diabetic  care.  The patient has been on 22 units of Humulin 3 times a day and  Humalog to control blood sugars.  She has normal fetal echocardiogram.  The patient awoke at 4:30 a.m. on the day of admission with pain and a  gush of fluids.  She came to MAU and was 5-cm dilated with contractions  every 3-4 minutes.   PAST MEDICAL HISTORY:   ALLERGIES:  PENICILLIN, SULFA, LEVAQUIN PERCOCET, DARVOCET, MORPHINE  SULFATE, PHENERGAN, CECLOR, and AVANDIA.   OPERATIONS:  T and A, right breast surgery, D and C.   ILLNESSES:  Asthma, type 1 diabetes since age 32, anxiety, and  depression.   SOCIAL HISTORY:  No alcohol, tobacco, and  drugs.  The patient quit  smoking in November 2009.   FAMILY HISTORY:  Mother with breast cancer, anxiety, and depression.  Sister with high blood pressure, thyroid dysfunction, anxiety, and  depression.   OBSTETRICAL HISTORY:  Spontaneous abortion x3, D and C in 2008.   PHYSICAL EXAMINATION:  VITAL SIGNS:  On admission revealed normal vital  signs.  HEART AND LUNGS:  Normal.  ABDOMEN:  Soft and nontender.  Cervix on admission was 5 cm in MAU and 8  cm on arrival in her room and shortly thereafter 10 cm with the vertex  at a 0 station.   ADMITTING IMPRESSION:  Intrauterine pregnancy at 30 weeks 6 days, active  labor, diabetes.  IV Kefzol was requested, however, this was delayed  administration because of some confusion with the nursing staff and the  pharmacy about whether she could take it.  The patient was also advised  to get IM betamethasone and we prepared for delivery.  She pushed, but  did not pushed well.  She kept stating she could not push and commanded  me get it out.  There was gradual descent of the head and finally the  head delivered.  The chin was delivered by pressing on the perineum.  The head did turtle sign and McRoberts, Wood screw, suprapubic  pressure, axillary lift of the right axilla, and left mediolateral  episiotomy were done.  The nurse reported a 2-minute delay in delivery,  but whether the time was accurately done I do not know.  The infant was  attended by Dr. Ruben Gottron and he assigned Apgars of 5 at 1 and 9 at 5  minutes.  Placenta and cord were very large, uterus was normal, left  mediolateral episiotomy was repaired with 3-0 Vicryl.  Blood loss about  400 mL.  The infant was 4-pound 15-ounce female infant.   Postpartum, the patient did quite well.  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  Her blood sugars were well-controlled while she was in the  hospital, however, she did have a couple of episodes of hypoglycemia,  the Humulin dose was decreased from 22 units to 20 units and actually  the Humulin had been given at 8-hour intervals and the patient will  resume her normal administration of giving the Humulin before meals.  She will cover her postprandial blood sugars with her Humalog in her  normal method.   LABORATORY DATA:  Postpartum hemoglobin was 13.3, hematocrit 32, white  count 10,900, and platelet count 159,000.  Her blood sugars while she  was in the hospital were 137, 114, 107, 41, 46, 59, 82, 234, 94, and  127.   The patient does have an appointment to see Dr. Sharl Ma, her  endocrinologist on Wednesday, April 11, 2009.  The patient wants to  return to work on Monday, April 09, 2009, and she has my permission to do  so.  So when the baby was released from the NICU, she will be able to  take her family leave time.  She declines any prescription medications  for pain at the time of discharge and was asked to resume her normal  medications at home.  She is to return in 6 weeks and was given a  discharge instruction booklet.      Malachi Pro. Ambrose Mantle, M.D.  Electronically Signed     TFH/MEDQ  D:  04/06/2009  T:  04/06/2009  Job:  [id number]

## 2011-02-04 NOTE — Discharge Summary (Signed)
[name] NO.:  [id number]   MEDICAL RECORD NO.:  [id number]          PATIENT TYPE:  INP   LOCATION:  4703                         FACILITY:  [facility]   PHYSICIAN:  Valerie A. Felicity Coyer, MDDATE OF BIRTH:  11-16-1980   DATE OF ADMISSION:  07/30/2008  DATE OF DISCHARGE:  08/02/2008                               DISCHARGE SUMMARY   DIAGNOSES AT THE TIME OF DISCHARGE:  1. Status post diabetic ketoacidosis on admission in setting of acute      viral gastroenteritis.  2. Gastroesophageal reflux disease.  3. Dehydration secondary to diabetic ketoacidosis.  4. Hypokalemia, resolved status post repletion.  5. History of depression.  6. History of depression/anxiety.   HISTORY OF PRESENT ILLNESS:  [name] is a 30 year old female who was  admitted on July 30, 2008, with chief complaint of vomiting, thirst,  and back pain.  She noted that she had had low back pain for 2 days  duration, which was not associated with nausea, vomiting, and diarrhea.  She become progressively worse and was confused at times.  She has a  history of type 1 diabetes and was apparently unable to keep anything  down.  She was admitted and evaluation and treatment of her DKA.   PAST MEDICAL HISTORY:  1. Diabetes type 1.  2. GERD.  3. History of breast lump.  4. Anxiety/depression.   COURSE OF HOSPITALIZATION:  DKA.  The patient was admitted.  She was  placed on IV insulin protocol.  Her bicarb on admission was 5.  She  remained on IV insulin from July 30, 2008, to the a.m. of August 01, 2008.  She was then transitioned over to Lantus plus a sliding scale  and meal coverage.  She has remained stable.  BiPAP at the time of  discharge was 26.  Her CBGs are ranging in the high 100s and her GI  symptoms are resolved.  At this time, we planned to discharge the  patient to home with close outpatient followup with Dr. Talmage Coin,  her primary endocrinologist.  She was noted to  have a very high  hemoglobin A1c of 11.7 during this admission.  We will resume her home  insulin regimen at the time of discharge and defer changes to her  endocrinologist.   MEDICATIONS AT THE TIME OF DISCHARGE:  1. Lexapro 10 mg p.o. daily.  2. Robitussin DM 2 teaspoons every 6 hours as needed.  3. Alprazolam 0.25 one half to one tablet p.o. daily as needed.  4. Propranolol 10 mg p.o. t.i.d.  5. Lantus insulin 60 units at bedtime daily.  6. Humalog meal coverage as before, which per the patient was a 1:5      carb ratio with formula of blood sugar -110 divided by 15, defer      further changes to Dr. Talmage Coin.   Pertinent laboratories at the time of discharge, BUN 6, creatinine 0.52,  hemoglobin A1c 11.7, potassium 3.6, hemoglobin A1c 11.7.   FOLLOWUP:  The patient is scheduled to follow up with Dr. Talmage Coin  on August 09, 2008, at 2:15 p.m.  She was instructed to call Dr. Sharl Ma  or Dr. Posey Rea should her sugars be less than 80 or greater than 300.    Greater than 30 minutes were spent on discharge planning.      Sandford Craze, NP      [name], MD  Electronically Signed    MO/MEDQ  D:  08/02/2008  T:  08/02/2008  Job:  [id number]   cc:   Georgina Quint. Plotnikov, MD  Talmage Coin, M.D.

## 2011-02-04 NOTE — Op Note (Signed)
NAME:  [name], [name] NO.:  [id number]   MEDICAL RECORD NO.:  [id number]          PATIENT TYPE:  MAT   LOCATION:  MATC                          FACILITY[facility]  [facility]   PHYSICIAN:  Juan H. Lily Peer, M.D.DATE OF BIRTH:  [birth year]   DATE OF PROCEDURE:  02/14/2007  DATE OF DISCHARGE:                               OPERATIVE REPORT   SURGEON:  Juan H. Lily Peer, M.D.   INDICATIONS FOR OPERATION:  A 30 year old gravida 4, para 0, AB 3 at 71-  1/2 weeks' estimated gestational age with poor rising of quantitative  beta HCG. Recent ultrasound at 11 weeks' gestation with an empty  gestational sac. Patient with increasing abdominal cramping and  bleeding.  Patient with history of type 1 diabetes as well.   PREOPERATIVE DIAGNOSIS:  1. First trimester missed AB.  2. Type 1 diabetes mellitus.   POSTOPERATIVE DIAGNOSIS:  1. First trimester missed AB.  2. Type 1 diabetes mellitus.   ANESTHESIA:  MAC and paracervical block with 2% lidocaine with 1:100,000  epinephrine.   PROCEDURE PERFORMED:  D&E.   FINDINGS:  An 8-10 weeks' size anteverted uterus.  No palpable adnexal  masses.  Blood and large clots present in the vaginal vault.  Cervix  slightly dilated.   DESCRIPTION OF OPERATION:  After the patient was adequately counseled  she was taken to the operating room where she underwent successful  intravenous sedation.  She was placed in high lithotomy position and the  vagina was prepped and draped in usual sterile fashion.  Red rubber  Roxan Hockey was inserted to evacuate the bladder of its contents for  approximately 50 mL. Due to a multitude of allergies the patient  received clindamycin 900 mg IV.  Speculum was inserted into the vaginal  vault, 2% Xylocaine with 1:100,000 epinephrine was infiltrated in the  cervical vaginal stroma at 2, 4, 8, and 10 o'clock position for total 10  mL. The cervix was dilated to the point to be able to allow 10 mm  suction curette to be  introduced into the intrauterine cavity to  evacuate the cavity of its contents.  This was interchanged with a  serrated curette to completely evacuate the cavity.  The single-tooth  tenaculum was removed.  The patient was extubated and transferred to  recovery with stable vital signs. Preoperative patient blood sugar was  161 and we will be checking again in the recovery room.  Her blood type  is A+.  An aliquot of the specimen was submitted for chromosomal  analysis secondary to the fact the patient has recurrent pregnancy  losses and also preoperatively along with her CBC and blood type  antiphospholipid antibodies for IgG and IgM along with lupus  anticoagulant were obtained as part of evaluation for recurrent  pregnancy losses.  The patient IV fluids was approximately 800 mL and  blood loss was minimal.      Juan H. Lily Peer, M.D.  Electronically Signed     JHF/MEDQ  D:  02/14/2007  T:  02/14/2007  Job:  [id number]

## 2011-02-04 NOTE — Assessment & Plan Note (Signed)
[facility]                                 ON-CALL NOTE   NAME:[name], Lapaglia                        MRN:          [id number]  DATE:08/05/2008                            DOB:          May 21, 1981    TIME:  8:24 p.m.   PHONE NUMBER:  [phone].   The patient of Dr. Posey Rea. She has recently seen Dr. Yetta Barre and Dr.  Milinda Antis on-call.   CHIEF COMPLAINT:  Swelling.   The patient said she was discharged from the hospital last week with  BKA.  Her blood sugars did a lot better but she developed quite a bit of  swelling when she was in the hospital from her IV fluids.  She said this  has happened again but not quite this severely.  She denies fever,  chills, urinary symptoms, shortness of breath, chest pain, or any other  symptoms at all.  She said that she has taken Lasix in the past for  swelling like this and it usually works well.  She saw Dr. Yetta Barre in the  office yesterday and he did labs on her to check the potassium and other  things before deciding to put her on a fluid pill.  I did check her  labs.  On EMR, her potassium was low at 3.0, sodium was 146, chloride of  110, her glucose was normal at 78 with normal creatinine.  I reviewed  those with her and we discussed the importance of increasing her  potassium level.  She states when in the hospital she was being given  potassium because it was low  there as well.  Our plan was to try her on  Lasix 20 mg to try 1 dose today and 1 tomorrow to help get fluid off and  to take K-Dur 20 mEq 1 p.o. b.i.d. and to call the office on Monday  morning to get more lab work.  I advised her that if her swelling does  not improves or she develops leg cramps, high sugar, or any other  symptoms to call back or go to the emergency room after hours if  necessary.  I called in prescriptions to [facility] at [phone],  Lasix 20 mg 1 p.o. daily #5 with no refills and K-Dur 20 mEq 1 p.o.  b.i.d. #20 with no refills.   The patient will call our office for an  appointment on Monday or call back earlier if any problems.     [name], MD  Electronically Signed    MAT/MedQ  DD: 08/05/2008  DT: 08/06/2008  Job #: [id number]

## 2011-02-04 NOTE — Assessment & Plan Note (Signed)
[facility]                                 ON-CALL NOTE   NAME:WELCHELCarina, Chaplin                        MRN:          [id number]  DATE:11/21/2007                            DOB:          [birth year]    DATE OF INTERACTION:  November 21, 2007, 12:43 p.m.  Phone number is [phone].   OBJECTIVE:  The patient was in the hospital this week, discharged  yesterday by Dr. Drue Novel.  She was there for DKA.  She was getting NovoLog  in the hospital at 70/30, 45 and 75.  Sugars were running 90-115 on  discharge.  She was discharged home on her insulin, which was Humalog  75/25, and was discharged on 48-68.  Prior to hospitalization she was on  55 and 75.  Her sugars now are running in the low 200s and she is  concerned about that.  I told her to increase her insulin dose 3 units  b.i.d. and if her sugar remains high for a few days, increase it by  another 3 units and continue to do that.  If she is not sure what is  going on, call and get an appointment to be seen, and I also encouraged  her to be exercising.  She is doing nothing at this point.  I told her  to walk even if she needed to do that just in her living room to try and  get some exercise, and to watch her diet.  Her meal today at lunch time  was a tuna sub.  I told her to be careful of her carb intake.   PRIMARY CARE Ps[facility] Arman:  Georgina Quint. Plotnikov, MD  Home office is [place].     Arta Silence, MD  Electronically Signed    RNS/MedQ  DD: 11/21/2007  DT: 11/22/2007  Job #: [id number]

## 2011-02-04 NOTE — Assessment & Plan Note (Signed)
[facility]                                 ON-CALL NOTE   NAME:[name], [name]                        MRN:          [id number]  DATE:02/27/2007                            DOB:          Jul 11, 1981    TIME OF CALL:  5:05pm   TELEPHONE NUMBER:  [phone]   REGULAR DOCTOR:  Dr. Posey Rea   CHIEF COMPLAINT:  Boil. The patient said that she has a history of boils  on and off on her thighs. She had one that opened up on her inner thigh.  She changed the bandages and there are three areas confluent together  and one has a very deep, at least 1/4 to 1/2 inch hole in it. She said  that in the past when they are this deep they have to be packed and  dressed. She has no pain or fever, but was a little chilled last night,  possibly from a sunburn. She has been taking some old Cipro that ran  out. She wants to know what to do. I told her to go to urgent care for  evaluation and they might have to dress the wound and that is what she  is going to do.     [name], MD  Electronically Signed    MAT/MedQ  DD: 02/28/2007  DT: 03/01/2007  Job #: [id number]   cc:   Georgina Quint. Plotnikov, MD

## 2011-02-04 NOTE — H&P (Signed)
NAME:  [name], [name] NO.:  [id number]   MEDICAL RECORD NO.:  [id number]          PATIENT TYPE:  MAT   LOCATION:  MATC                          [facility]ACILITY:  [facility]   PHYSICIAN:  Juan H. Lily Peer, M.D.DATE OF BIRTH:  May 27, 1981   DATE OF ADMISSION:  02/14/2007  DATE OF DISCHARGE:                              HISTORY & PHYSICAL   CHIEF COMPLAINT:  Abdominal cramping, vaginal bleeding, history of  positive pregnancy.   HISTORY:  The patient is a 30 year old gravida 4, para 0, AB3 whose last  menstrual period was November 25, 2006, which would place the patient  currently at 11-1/[redacted] weeks gestation.  The patient has had abdominal  cramping since early last week and had run some quantitaive beta HCGs  which she states she recalls one on this past Wednesday was 11,200  international units per milliliter.  Three days later at the office of  [facility] was [phone].  She had an ultrasound, and it looked  like she had what appeared to be a blighted ovum.  A gestational sac was  seen with no fetal pole or cardiac activity seen.  She was scheduled to  return to the office next week if she continues to have any further  cramping or bleeding for followup. She is now for a planned D&E because  she stated that yesterday she began cramping and bleeding and today was  passing large clots, and the pain was unbearable.  She presented to  [facility] with blood and blood clots in the vaginal vault, and  her uterus on examination was approximately 8-10 weeks size.   Her vital signs were as follows.  Blood pressure 105/67, pulse 80,  respirations 20, temperature 98.1.  The patient is a type 1 diabetic for  which she takes Humalog 75, 4/25 (35 units in the morning and 55 units  in the evening).  She is taking insulin in the morning, and her last  meal was at noon and was a light meal.  Blood sugar done upon arrival to  [facility] ER was 161.   PAST MEDICAL HISTORY:   ALLERGIES:  She has a multitude of allergies.  She is allergic to LATEX,  PENICILLIN, CECLOR, SULFA, AVANDIA, DARVOCET, PERCOCET, PREDNISONE, IV  POTASSIUM, PHENERGAN, and MORPHINE.   SURGERIES:  Have included:  1. Bunionectomy on the left foot  2. Sty of the left eye.  3. Tonsillectomy.  4. Adenoidectomy.  5. Lumpectomy of the right breast, benign.  6. Wisdom tooth.  7. Laparoscopic cholecystectomy.   MEDICATIONS:  1. Humalog 75, 4/25 (35 units in the morning, 55 units in the      evening).  2. Prenatal vitamins.   PHYSICAL EXAMINATION:  VITAL SIGNS: As described above.  HEENT:  Unremarkable.  NECK:  Supple, trachea midline.  No carotid bruits, no thyromegaly.  LUNGS: Clear to auscultation without rhonchi or wheeze.  HEART:  Regular rate and rhythm.  No murmurs or gallops.  BREASTS:  Exam not done.  ABDOMEN:  Soft, nontender.  No rebound or guarding.  PELVIC:  Bartholins, urethra, Skene within normal  limits.  Vagina with  large pieces of clot in the vaginal vault.  Cervix slightly dilated.  Uterus approximately 8 to 10-week size.  No  palpable adnexal masses.  EXTREMITIES:  DTRs 1+, negative clonus.  No edema.   ASSESSMENT:  A 30 year old, gravida 4, para 0, AB 3, with apparent  history of missed AB, poorly increasing quantitative beta HCG in the  office recently, increasing cramping, bleeding, and passage of large  clots.  Inevitable AB is present.  The patient will be taken to the  operating room for a dilatation and evacuation.  The patient is a type 1  diabetic on insulin.  Morning dose was at noon.  Blood sugar in the  emergency room was 161.  Will check her blood sugar postoperatively and  upon discharge instruct the patient not to take her evening insulin, to  begin eating, and then start her regular insulin regimen starting  tomorrow with her breakfast.  Due to her multitude of allergies, she  will be placed on Clindamycin prophylactically IV before her surgery and   will be prescribed Motrin and Demerol for pain relief postoperatively.  The risks and benefits and pros and cons of D&E were discussed.  The  patient also, because of her recurrent pregnancy losses, will have  antiphospholipid antibody, IgG and IgM and lupus anticoagulant drawn.  CBC and blood type Rh pending at the time of this dictation.  Will also  submit aliquot of the tissue for chromosomal analysis.  She may continue  her evaluation for recurrent pregnancy losses after she returns to the  office in 2 weeks for postop visit with Dr. Audie Box.   PLAN:  The patient is scheduled for emergency D&E.  Please have History  and Physical available.      Juan H. Lily Peer, M.D.  Electronically Signed     JHF/MEDQ  D:  02/14/2007  T:  02/14/2007  Job:  [id number]

## 2011-02-04 NOTE — Assessment & Plan Note (Signed)
[facility]                             PULMONARY OFFICE NOTE   NAME:Fitzgerald, Barbara MUSOLF                      MRN:          [id number]  DATE:05/25/2007                            DOB:          10-09-1980    HISTORY OF PRESENT ILLNESS:  This is a very pleasant, 30 year old, white  female patient of Dr. Posey Rea who has a known history of diabetes  mellitus that presents for an acute office visit.  The patient is a CMA  in our pulmonary practice.  The patient presents today complaining of a  1-week history of productive cough with thick, yellow sputum, fever,  painful coughing and intermittent wheezing.  The patient has noticed  over the last few days, she has had some blood tinged sputum.  The  patient denies any frank hemoptysis, shortness of breath, abdominal  pain, nausea, vomiting, rash, recent travel or antibiotic use.   PAST MEDICAL HISTORY:  Reviewed in detail.   CURRENT MEDICATIONS:  Reviewed in detail.   PHYSICAL EXAMINATION:  GENERAL:  The patient is a pleasant female in no  acute distress.  VITAL SIGNS:  She is afebrile with stable vital signs.  O2 saturations  98% on room air.  HEENT:  Nasal mucosa with some mild erythema.  Posterior pharynx is  clear.  NECK:  Supple without cervical adenopathy.  No JVD.  LUNGS:  Coarse breath sounds without any wheeze.  No crackles.  CARDIAC:  Regular rate and rhythm.  ABDOMEN:  Soft and nontender.  EXTREMITIES:  Warm without any edema.   IMPRESSION/PLAN:  Acute tracheobronchitis.  Chest x-ray is pending at  the time of dictation.  The patient is recommended to be on Avelox x5  days, Mucinex DM twice a day, Xopenex nebulizer treatments given.  The  patient may use Xyzal 5 mg at bedtime as needed for postnasal drip  symptoms.  The patient has been given a prescription for Phenergan with  codeine for cough control.  The patient was encouraged on smoking  cessation.  The patient will return back with Dr.  Posey Rea as scheduled  or sooner if needed.      Rubye Oaks, NP  Electronically Signed      Lonzo Cloud. Kriste Basque, MD  Electronically Signed   TP/MedQ  DD: 05/25/2007  DT: 05/25/2007  Job #: [id number]

## 2011-02-04 NOTE — Discharge Summary (Signed)
[name] NO.:  [id number]   MEDICAL RECORD NO.:  [id number]          PATIENT TYPE:  INP   LOCATION:  6702                         FACILITY:  [facility]   PHYSICIAN:  Willow Ora, MD           DATE OF BIRTH:  1981/08/18   DATE OF ADMISSION:  11/14/2007  DATE OF DISCHARGE:  11/20/2007                               DISCHARGE SUMMARY   PRIMARY CARE PHYSICIAN:  Georgina Quint. Plotnikov, M.D.   BRIEF HISTORY AND PHYSICAL:  [name] is a 30 year old white female  with history of insulin-dependent diabetes, depression, asthma and  migraines who was admitted to the hospital with a several-day history of  fatigue and not feeling well.  She admitted to taking her insulin  intermittently.  She rarely check her blood sugars at home.  At the time  of admission, she was feeling lightheaded with a blood sugar of  approximately 400, polyuria and polydipsia.   PHYSICAL EXAMINATION:  LUNGS:  Clear to auscultation bilaterally.  CARDIOVASCULAR:  Regular rate and rhythm without a murmur.  ABDOMEN:  Soft, nontender.  No organomegaly and normal bowel sounds.  NEUROLOGIC:  Normal mental status, normal reflexes.  No focal findings.  She was alert and oriented.   LABORATORY AND X-RAYS:  Upon admission, her white count was 17.8 with a  hemoglobin of 16.1 and platelets of 433.  Subsequent CBC showed a white  count of 10.7, hemoglobin of 12.9 after IV fluids.  Initial BMP showed a  sodium of 133, potassium 4.7, chloride 107, CO2 9, BUN 18, creatinine  1.08.  Last BMP in the hospital showed a potassium of 3.5 and a  creatinine of 0.5.  LFTs were normal except for a bilirubin of 1.7.  Calcium was 8.2.  Total cholesterol 164.  Urine cultures were negative.  Blood culture number one was negative.  Blood culture number two showed  gram-positive rods.  No ID reported.  A second urine culture showed  Lactobacillus species.  Hemoglobin A1c 11.2.  Chest x-ray done on  February 25 was  negative.   HOSPITAL COURSE:  The patient was admitted to the hospital with presumed  diabetic ketoacidosis, hyponatremia, leukocytosis, nausea and vomiting.  She was started on IV fluids and a glucommander.  She was continued with  most of her routine medications.  She also received Lovenox for DVT  prophylaxis.  During this admission, her hypokalemia resolved.  She was  continued with doxycycline for presumed MRSA folliculitis.  She also  developed a cough and a low-grade fever with a normal chest x-ray and  was diagnosed with bronchitis.  The diabetes coordinator was consulted,  and they spoke with the patient at length about her diabetes.  Two days  prior to admission she was somehow hypotensive and tachycardic, but  later on that resolved with IV fluids.  At the day of the discharge, her  vital signs are stable except for a temperature of 99.5, but she is  feeling much better and she feels ready to go home.  While in the  hospital, she has  been getting 70/30 insulin, 45 units in the morning  and 75 units in the afternoon, and her morning blood sugars have been  113 and 115.  She did have hypoglycemia at 10:00 p.m. last night.  At  home, she just got a new supply of 75/25 insulin and consequently she  will be discharged with that type of insulin, 48 units in the morning  and 68 in the afternoon.   ADMISSION DIAGNOSIS:  Diabetic ketoacidosis.   DISCHARGE DIAGNOSES:  1. Diabetic ketoacidosis that responded well to treatment.  2. Bronchitis, on Avelox.  3. Methicillin-resistant Staphylococcus aureus folliculitis.  Will      continue with doxycycline.  4. History of depression, on Lexapro.  5. History of palpitations, on p.r.n. beta blockers.   DISCHARGE INSTRUCTIONS:  1. Diabetic diet.  2. Doxycycline 100 mg twice a day.  3. Lexapro 10 mg once a day.  4. Robitussin DM 2 teaspoons every 6 hours as needed.  5. Alprazolam 0.25, 1/2 to 1 tablet daily as needed.  6. Propranolol 10  mg 1 tablet every 8 hours as needed for palpitation.  7. Insulin 75/25, 48 units in the morning and 68 units in the      afternoon.  8. Avelox 400 mg once a day for 5 days.  9. Advised to go to the emergency room if she has severe cough,      shortness of breath or high fever.  10.Advised to see her primary care doctor, Dr. Posey Rea, next week.  11.The patient is reminded about hypoglycemic symptoms.   TIME SPENT ON DISCHARGE:  More than 30 minutes.      Willow Ora, MD  Electronically Signed     JP/MEDQ  D:  11/20/2007  T:  11/21/2007  Job:  [id number]   cc:   Georgina Quint. Plotnikov, MD

## 2011-02-04 NOTE — H&P (Signed)
[name], [name] NO.:  [id number]   MEDICAL RECORD NO.:  [id number]          PATIENT TYPE:  INP   LOCATION:  3312                         FACILITY:  [facility]   PHYSICIAN:  Georgina Quint. Plotnikov, MDDATE OF BIRTH:  October 09, 1980   DATE OF ADMISSION:  07/30/2008  DATE OF DISCHARGE:                              HISTORY & PHYSICAL   `   CHIEF COMPLAINT:  Vomiting, thirst, and back pain.   HISTORY OF PRESENT ILLNESS:  The patient is a 30 year old female who has  been sick with low back pain of 2 days duration, nausea, vomiting, and  diarrhea.  She has been getting progressively worse.  She was confused  at times today.  The family brought her in to the emergency room at [facility] where she was diagnosed with DKA and I was called to admit the  patient.   PAST MEDICAL HISTORY:  1. Type 1 diabetes.  2. GERD.  3. History of breast lump.   PAST SURGICAL HISTORY:  History of breast lump resection, benign.   SOCIAL HISTORY:  She is a Lawyer at [employer].  She is a smoker and  drinks rarely.   FAMILY HISTORY:  Type 2 diabetes, COPD, and high blood pressure.   ALLERGIES:  (Mostly intolerances) CECLOR, DARVOCET, LEVAQUIN, MORPHINE,  PENICILLIN, PERCOCET, PHENERGAN, SEPTRA, LATEX, and AVANDIA.   Of note, she is able to take Avelox, doxycycline, Dilaudid, and Vicodin.   REVIEW OF SYSTEMS:  No chest pain or shortness of breath.  She is  feeling extremely better right now.  Denies headache, back cramps, and  abdominal pain that she attributes to uncontrollable vomiting, very  thirsty, and no leg cramps.  The rest is as above or negative (18-point  review of system).   PHYSICAL EXAMINATION:  VITAL SIGNS:  Temperature 98.2, blood pressure  140/89, heart rate 126-134, respirations 24, and saturation 100% on room  air.  GENERAL:  She is moderate to acute distress.  Face flushed.  HEENT:  Dry mucosa.  No meningeal signs.  Pupils reactive.  NECK:  Supple.  LUNGS:   Clear.  No wheezes or rales.  HEART:  S1 and S2, tachycardia, and regular.  ABDOMEN:  Soft, sensitive, nontender, and nondistended.  No  organomegaly.  No masses felt.  EXTREMITIES:  Lower extremities without edema.  Calves nontender.  LS  spine without focal tenderness at this time.  Muscle strength within  normal limits.  No meningeal signs.   LABORATORY DATA:  Glucose 455 on arrival.   WBC is 22.9 thousand, hemoglobin 16.8, MCV 98.2, and platelets 541,000.  Blood acetone with small amounts.  Bicarb 5, magnesium 2.4.  Sodium 132,  potassium 5.4, glucose 450, BUN 20, and creatinine 1.45.  EKG with sinus  tachycardia.  Chest x-ray was clear.   ASSESSMENT AND PLAN:  1. Diabetic ketoacidosis.  We will admit to step-down, start on      glucose stabilizer IV insulin, and IV fluids.  Rule out urinary      tract infection, pancreatitis, and others as a trigger.  Some labs  including UA are still pending.  2. Dehydration.  We will treat aggressively with IV fluids.  3. Low back pain, muscle strain from vomiting versus other.  She is      able to tolerate Dilaudid, will use p.r.n.  Urinalysis is pending.  4. Nausea, vomiting, and diarrhea.  Possible gastroenteritis.  We will      treat symptomatically.      Georgina Quint. Plotnikov, MD  Electronically Signed     AVP/MEDQ  D:  07/30/2008  T:  07/30/2008  Job:  [id number]   cc:   Talmage Coin, M.D.

## 2011-02-07 NOTE — H&P (Signed)
[name], STEGER NO.:  [id number]   MEDICAL RECORD NO.:  [id number]          PATIENT TYPE:  OBV   LOCATION:  5702                         FACILITY:  [facility]   PHYSICIAN:  Dr. Daphine Deutscher             DATE OF BIRTH:  12-Jun-1981   DATE OF ADMISSION:  12/02/2006  DATE OF DISCHARGE:  12/03/2006                              HISTORY & PHYSICAL   Primary care physician is Dr. Posey Rea.   CHIEF COMPLAINT:  Right upper quadrant abdominal pain and known  gallstones.   HISTORY OF PRESENT ILLNESS:  [name] is a 30 year old female  patient, insulin-dependent diabetes, has had intermittent indigestion  and abdominal pain for 1 year, increased symptoms over the past few  months, now more localized to the right upper quadrant.  She  subsequently underwent ultrasound of the abdomen which revealed  gallstones but no evidence of cholelithiasis or any ductal dilatation.  Because she is diabetic Dr. Posey Rea placed her on Cipro for antibiotic  coverage as well as tramadol for pain and Phenergan for nausea until she  could be scheduled to see Dr. Colin Benton.  She is scheduled to see Dr. Colin Benton  within the next week or so.  Yesterday evening after eating soup and  grilled cheese the patient was awakened about 5 o'clock this morning  with severe right upper quadrant abdominal pain associated with nausea,  vomiting, chills and diarrhea.  She came to the ER.  Her white count is  normal, her LFTs are normal, but despite receiving Dilaudid she is still  experiencing right upper quadrant tenderness.  A surgical evaluation has  been requested.   REVIEW OF SYSTEMS:  As above, the patient has noted recently that  certain foods appear to precipitate symptomatology such as greasy or  fatty foods.   PAST MEDICAL HISTORY:  1. Diabetes mellitus, on insulin.  2. Childhood asthma, resolved.  3. Irregular heartbeat.  The patient has never had EKG, thinks she is      having PVCs.   PAST SURGICAL  HISTORY:  1. Two prior breast surgeries for nonmalignant breast masses by Dr.      Colin Benton.  2. Left foot bunionectomy.  3. Tonsillectomy as a child.   SOCIAL HISTORY:  No alcohol.  No tobacco.  She is married.   FAMILY HISTORY:  Noncontributory.   ALLERGIES:  AVANDIA WHICH CAUSES SWELLING OF THE FACE AND NECK, CECLOR  WHICH CAUSES A RASH, SULFA WHICH CAUSES A RASH, DARVOCET CAUSES  SWELLING, LEVAQUIN CAUSES SWELLING, PENICILLIN CAUSED A RASH, PERCOCET  CAUSES SWELLING, IV POTASSIUM CAUSES SEVERE PAIN AND AGITATION.  THE  PATIENT IS INTOLERANT TO PREDNISONE DUE TO SEVERE HYPERGLYCEMIA BUT IS  NOT ALLERGIC.  SHE ALSO HAS A DOCUMENTED LATEX ALLERGY.   CURRENT MEDICATIONS:  Include:  1. Humalog with meals 10 units, 15 units and 2 units.  2. Lantus insulin 40 units at hour of sleep.  3. Cipro 500 mg p.o. b.i.d.  4. Ultram 50 mg q.6 h p.r.n. pain.  5. Phenergan 25 mg tablets q.6 h p.r.n. nausea, vomiting.  PHYSICAL EXAMINATION:  GENERAL:  Pleasant female patient complaining of  right upper quadrant abdominal pain with nausea and vomiting, improved  but not resolved after IV Dilaudid.  VITAL SIGNS: Temperature 97, BP 114/71, pulse 79 and irregular,  respirations 16.  NEURO:  The patient is alert and oriented x3, moving all extremities x4.  No focal deficits.  HEENT:  Head normocephalic.  Sclerae are not injected.  NECK:  Supple.  No adenopathy.  CHEST:  Bilateral lung sounds are clear to auscultation.  Respiratory  effort is nonlabored.  She is on room air.  CARDIAC:  Pulse is irregular with no rubs, murmurs, thrills or gallops,  no JVD.  ABDOMEN:  Soft.  Bowel sounds are present.  Nondistended.  She has a  positive Murphy sign and she is tender with mild guarding in the right  upper quadrant.  No rebounding.  EXTREMITIES:  Symmetrical in appearance without edema, cyanosis or  clubbing.   LABORATORIES:  White count 7300, hemoglobin 13.7, platelets 346,000,  neutrophils 53%,  lipase 19.  Urinalysis negative.  Urine pregnancy  negative.  Sodium 137, potassium 3.9, CO2 26, glucose 351, BUN 9,  creatinine 0.68.  LFTs are normal.   IMPRESSION:  1. Biliary colic.  2. Known cholelithiasis.  3. Insulin-dependent diabetes mellitus with hyperglycemia.  4. Irregular heartbeat.   PLAN:  1. Dr. Daphine Deutscher has evaluated the patient and agrees that she would      benefit from operative intervention today.  Proceed with      laparoscopic cholecystectomy.  Risks and benefits of this procedure      have been discussed with the patient per Dr. Daphine Deutscher and she agrees      to proceed.  2. This the patient has multiple antibiotic and drug allergies IV      Cipro in the preop and in the postop period if indicated, n.p.o.      status, preop IV fluid without dextrose and WITHOUT POTASSIUM GIVEN      HER HISTORY OF SIGNIFICANT PAIN WITH INTRAVENOUS ADMINISTRATION.  3. The patient has significant hyperglycemia now.  We will go ahead      and check Glucometers q.4 h and offer      moderate insulin per sliding scale insulin.  No Humalog insulin      until she is tolerating an oral diet.  4. Because of irregular heartbeat and suspected PVCs we will go ahead      and check a preoperative EKG.  5. Notify the OR that THE PATIENT HAS A LATEX ALLERGY.      [name], N.P.    ______________________________  Dr. Daphine Deutscher    ALE/MEDQ  D:  12/02/2006  T:  12/04/2006  Job:  [id number]   cc:   Georgina Quint. Plotnikov, MD  Alfonse Ras, MD

## 2011-02-07 NOTE — Discharge Summary (Signed)
[place]. [facility]  Patient:    [name], [name]                       MRN: [id number] Adm. Date:  [id number] Disc. Date: [id number] Attending:  Tobin Chad Dictator:   Zella Ball, M.D. CC:         Justine Null, M.D. [facility]   Discharge Summary  DATE OF BIRTH:  [birth year]  DISCHARGE DIAGNOSES: 1. Diabetic ketoacidosis. 2. Insulin-dependent diabetes mellitus. 3. Hydradenitis. 4. Vaginal candidiasis.  PAST MEDICAL HISTORY: 1. Asthma. 2. Depressive disorder not otherwise specified.  DISCHARGE MEDICATIONS: 1. Insulin NPH 35 units subcutaneous q.a.m. and then NPH 17 units q.h.s. 2. Insulin Regular 18 units q.a.m., 9 units q.a.c. dinner. 3. Insulin Regular sliding scale:  If glucose 140-180, take 2 units of    Regular; if 180-220, take 4 units of Regular; 221-260, take 6 units of    Regular. 4. Keflex 500 mg p.o. b.i.d. for seven days. 5. Doxycycline 100 mg 1 pill b.i.d. after Keflex has finished. 6. Albuterol MDI p.r.n.  SPECIAL INSTRUCTIONS:  The patient is instructed to check her sugars before each meal and before dinner.  DIET:  The patient is to follow a diabetic diet.  FOLLOW-UP:  The patient is to follow up with: 1. Dr. Everardo All, endocrinology, Monday, Jan 25, 2001, at 3:30 p.m. 2. With Lenor Coffin, diabetes coordinator, for 1-on-1 diabetes teaching.  The    patient is to call to arrange an appointment with Lenor Coffin. 3. With Dr. Candee Furbish at [facility] on Feb 04, 2001,    Thursday, at 1:30 p.m.  PROCEDURES:  None.  CONSULTATIONS:  None.  HISTORY OF PRESENT ILLNESS:  For full presenting history, please see dictated H&P.  Briefly, this is a 30 year old girl with insulin-dependent diabetes mellitus who presented to the emergency department with a history of one day of having a boil in her perineal area.  The patient said she had quite a bad boil in her left labia, which began to develop the day before  presentation. The patient had chills overnight, and the boil broke open this morning.  Soon after this occurred she developed nausea and vomiting.  As well she was short of breath, and she came to the emergency department.  The patient stated that she had taken both her p.m. dose of Lantus and dose of Humalog, which was her outpatient regimen.  PHYSICAL EXAMINATION:  VITAL SIGNS:  Afebrile, heart rate 113, respiratory rate of 24, blood pressure of 137/71.  Physical exam was generally a distressed-appearing patient who was breathing quite rapidly.  However, her judgment and insight into the situation appeared to be normal and appropriate.  Physical exam was otherwise remarkable for a roughly 10 x 5 cm erythematous area on the patients left labia which was raised, extremely tender to palpation, and had an open sinus which was about 1 x 1 cm in area which was draining purulent material.  LABORATORY DATA:  Initial laboratories showed patient to have a white count of 16.3, 80% polymorphonuclear cells, 13% lymphocytes, hemoglobin of 16, and a platelet count of 323.  Initial BMET showed a sodium of 139, potassium of 3.8, chloride of 115, a bicarbonate of less than 10, BUN of 9, and creatinine of 1.0.  Glucose was 225.  The patients initial fingerstick CBG had a serum glucose of 185.  Urinalysis was remarkable for a specific gravity of 1.026, a  pH of 5, greater than 1000 glucose, and greater than 80 ketones, 100 proteins, was nitrite leukocyte esterase and bilirubin negative.  A beta hCG was performed and was negative.  An arterial blood gas was performed which showed a pH of 7.04, a pCO2 of 8.6, and a pO2 of 129.  Bicarbonate by blood cast was 2.  Using the bicarbonate from the blood gas the patient had an anion gap of 26 at presentation.  Assessment at that time was a 30 year old type 1 diabetic who appeared to be in diabetic ketoacidosis secondary to a flare of hydradenitis which  she apparently has gotten quite frequently in the past.  HOSPITAL COURSE: #1 - DIABETIC KETOACIDOSIS:  The patient was brought into the hospital and placed on an insulin drip.  The patient was admitted initially to the stepdown unit but transferred to the MICU roughly one-half hour after reaching the floor and was begun on an insulin drip.  Initially, the patient was started on 8 units of insulin per hour.  In addition to this, she was started on D-5 normal saline for maintenance fluids and normal saline for replacement fluids. These were Yd into the same vein and titrated to keep the patients glucoses right about 200.  It was necessary during the course of her therapy to change to actually a D-10 half normal saline to keep her sugars high enough to administer the insulin.  CBGs were taken every hour, and an arterial line was placed, and BMETs and ABGs were taken initially every two hours due to the patients very low pH.  She was monitored closely and was kept in the MICU on this routine until her bicarbonate normalized and she was no longer acidotic. This happened on the morning of January 19, 2001.  At that time the patient was changed to an NPH and Regular regimen.  Total glucose needs were estimated from her previous home regimen.  The patient did fairly well on this regimen for the rest of the time that she was here, and her insulin needs were adjusted slightly during the rest of her hospitalization to fine-tune her serum blood glucoses.  The patient was moved out of the MICU on the morning of January 19, 2001, and spent the rest of her hospitalization on the regular medical floor.  The patient was seen by the diabetes coordinator during this time, and some diabetic teaching was done, and arrangements were made for the patient to follow up with 1-on-1 diabetic teaching as an outpatient.  #2 - HIDRADENITIS:  The patient was begun on Ancef and gentamicin when she initially presented, and  this was continued IV until the last day of her  hospitalization.  On the evening of January 19, 2001, the patient had the area of the boil lanced and packed, with return of several ccs of ______ sanguineous material.  At the time of discharge the HIDRADENITIS appeared to be actively healing.  The patient was given a prescription for Keflex to be taken twice a day for seven more days.  After telephone consultation with on-call dermatology, it was decided to begin the patient on doxycycline suppressive therapy for the hidradenitis with the caveat that the patient might need to progress to the use of Accutane at some point to control the hidradenitis.  This appears to be a recurrent problem for her in the perineal area.  #3 - VAGINAL CANDIDIASIS:  The patient received two doses of p.o. Diflucan while in the hospital with resolution of her  candidiasis.  DISPOSITION:  The patient was discharged to home.  CONDITION ON DISCHARGE:  Good. DD:  01/21/01 TD:  01/23/01 Job: [id number]

## 2011-02-07 NOTE — H&P (Signed)
[place]. [facility]  Patient:    [name], [name] Visit Number: [id number] MRN: [id number]          Service Type: MED Location: [phone] Attending Physician:  Sanjuana Letters Dictated by:   Juanell Fairly, M.D. Admit Date:  10/09/2001   CC:         Bradly Bienenstock, M.D.   History and Physical  22-Apr-1981  CHIEF COMPLAINT: Draining cyst and dizziness.  HISTORY OF PRESENT ILLNESS: This is a 30 year old female, with previous medical history of diabetes mellitus, who presents with a one day history of feeling dizzy.  She got light headed driving home from work and was feeling nauseated.  She stated this is how she felt the last time she went into DKA. She has been having increased CBGs over the past few days.  Called the on-call physician, who asked her to come in.  She denies shortness of breath, has had no cough, no dysuria.  She does have polydipsia and polyuria.  She has had no headache.  She also stated she has a draining cyst in her groin and was placed on Keflex, which she began taking last night.  REVIEW OF SYSTEMS: She has had no change in appetite, no weakness, no palpitations, no vomiting, no headaches, no gait changes, no muscle aches, no diplopia, no cough, no difficulty breathing, no rashes, no mood changes, no easy bruising.  PAST MEDICAL HISTORY:  1. Diabetes mellitus.  2. Polycystic ovary.  3. Anxiety.  4. Tobacco abuse.  5. Depressive disorder, not otherwise specified.  6. Unspecified asthma.  7. Hydradenitis.  8. Proteinuria.  9. Recurrent yeast vaginitis.  SOCIAL HISTORY: She smokes one pack per day of cigarettes.  MEDICATIONS:  1. Albuterol.  2. Doxycycline.  3. Glucophage.  4. Insulin.  ALLERGIES:  1. She is allergic to DARVOCET.  She gets angioedema.  2. PENICILLIN, she gets a rash.  3. SULFA DRUGS, she gets a rash.  4. CECLOR, she gets a rash.  5. AVANDIA, she gets foot swelling.  6. When  POTASSIUM is given IV she gets severe burning pain in her arms.  7. When PHENERGAN is given IV she gets extremely agitated.  PHYSICAL EXAMINATION:  VITAL SIGNS: Temperature 97.5 degrees, blood pressure 121/67, heart rate 100, respiratory rate 20.  GENERAL: This is a well-developed, well-nourished female in no acute distress.  NEUROLOGIC: She is alert and oriented and oriented x3.  Cranial nerves intact. Sensation intact.  Strength 5/5 upper and lower extremities.  HEENT: Normocephalic, atraumatic.  PERRLA.  EOMI.  Nares patent.  Mucous membranes moist.  NECK: Supple with no masses, no lymphadenopathy, no thyromegaly.  LUNGS: Good inspiratory effort.  No rales, no rhonchi, no crackles.  Clear to auscultation bilaterally.  HEART: S1 and S2 positive.  No murmurs.  Regular rate and rhythm.  ABDOMEN: Bowel sounds positive.  Nontender, nondistended.  SKIN: She has an approximate 5 x 10 cm cyst in her left groin.  It is erythematous, swollen, warm, tender, and has two openings which appear to be crusted over.  LABORATORY DATA: ABG was 7.245/26.3/115/11/98% on room air.  BMP sodium was 134, potassium 3.5, chloride 106, bicarbonate 12, BUN 10, creatinine 0.9, blood glucose 472, calcium 8.7, AST 9, ALT 12, alkaline phosphatase 77, bilirubin 1.7.  CBC WBC 7.7, hemoglobin 14.9, hematocrit 42.3; platelets 228,000.  UA specific gravity was 1.034, pH 5, glucose greater than 1000, ketones greater than 80, nitrites negative, leukocyte esterase negative.  Urine pregnancy was negative.  ASSESSMENT/PLAN: This is a 30 year old female, in diabetic ketoacidosis.  1. Diabetic ketoacidosis.  Will rehydrate, start an insulin drip.  Monitor     the patients potassium and monitor her mental status.  2. Hydradenitis suppurativa.  We will incise and drain the cyst, pack it     with Iodoform packing, and place her on intravenous antibiotics. Dictated by:   Juanell Fairly, M.D. Attending Physician:   Sanjuana Letters DD:  10/09/01 TD:  10/10/01 Job: [id number]

## 2011-02-07 NOTE — Discharge Summary (Signed)
[place]. [facility]  Patient:    CHANCE, KARAM Visit Number: [id number] MRN: [id number]          Service Type: MED Location: PEDS [id number] Attending Physician:  Sanjuana Letters Dictated by:   Juanell Fairly, M.D. Admit Date:  10/09/2001 Discharge Date: 10/12/2001   CC:         Bradly Bienenstock, M.D.   Discharge Summary  DATE OF BIRTH:  Apr 15, 1981  DISCHARGE MEDICATIONS: 1. 70/30 forty-five units in the morning and 25-30 at night. 2. Sliding scale as before hospitalization. 3. Keflex 500 mg q.i.d. 4. Flagyl 250 mg t.i.d. 5. Other medications as before hospitalization.  FOLLOWUP:  She will be discharged with an appointment to see Dr. Rondel Jumbo.  DISCHARGE DIAGNOSES: 1. Diabetic ketoacidosis. 2. Cellulitis.  HOSPITAL COURSE:  This is a 30 year old female with a previous medical history of diabetes mellitus who presented with a one-day history of feeling dizzy. She had polydipsia, polyuria, and increased CBGs over the past few days.  She also had an area of erythema and tenderness, which is draining, on her left upper thigh.  #1 - DIABETIC KETOACIDOSIS:  [name] came to the hospital with a blood sugar of 472.  Her ABG was pH of 7.245, PCO2 of 26.3, PO2 of 115, bicarb of 11, and O2 saturations of 98% on room air.  Her sodium was 134, potassium 3.5, chloride 106, bicarb 12, BUN 10, creatinine 0.9.  Calcium was 8.7, AST was 9, ALT was 12, alkaline phosphatase 77, and total bilirubin 1.7.  Protein was 6.3.  Albumin was 3.4.  Her WBC was 7.7, hemoglobin 14.9, hematocrit 42.3, platelet count 228.  Her UA showed a specific gravity of 1.034, pH of 5, a glucose of greater than 1000, ketones greater than 80, nitrite negative, and leukocyte esterase negative.  It also showed yeast.  The patient was placed on an insulin drip and she had hourly CBGs and q.2h. BMPs and her potassium was replaced orally until her CBGs and potassium came into the  normal range.  She was restarted on 70/30 insulin and monitored overnight for a second night, after which her 70/30 was adjusted and the patient was allowed to go home.  #2 - CELLULITIS:  The patient has had history of suppurative hydradenitis. This particular episode did not involve apocrine sweat glands.  Therefore, it was more likely to be simple cellulitis rather than hydradenitis.  She was treated with IV clindamycin and Ancef and on the 20th switched to Flagyl and Keflex p.o.  She will be discharged home on these to complete a 10-day course.  #3 - YEAST:  This was an incidental finding on UA.  She was asymptomatic on admission but by the 20th she stated that she had vaginal itching.  She was given 150 mg p.o. Diflucan in a one-time dose and this has resolved at the time of discharge.  PENDING RESULTS:  There is a C-peptide which was drawn which is still pending.  SUGGESTED FOLLOWUP ITEMS:  This patient continues to need diabetic education and close monitoring to maintain control of her blood sugars. Dictated by:   Juanell Fairly, M.D. Attending Physician:  Sanjuana Letters DD:  10/12/01 TD:  10/12/01 Job: [id number]

## 2011-02-07 NOTE — Discharge Summary (Signed)
[place]. [facility]  Patient:    [name]                        MRN: [id number] Adm. Date:  [id number] Disc. Date: [id number] Attending:  Justine Null CC:         Justine Null, M.D. [facility]                           Discharge Summary  HISTORY OF PRESENT ILLNESS:  [name] is an 30 year old white female, patient of Dr. Romero Fitzgerald, who presented with right lower quadrant pain.  She is a type  diabetic who was found to have diabetic ketoacidosis.  She had not felt well on the day of admission, and did not take her insulin.  Recent past medical history significant for pregnancy in November 2000, with a miscarriage and subsequent right lower quadrant pain felt secondary to ovarian cyst.  On admission, history was taken from her mother as the patient was sleepy and unable to give a history.  PAST MEDICAL HISTORY: 1. Type 1 diabetes mellitus, overall uncontrolled since her diagnosis in December    1999.  She had never been hospitalized for diabetes. 2. Status post tonsillectomy. 3. History of recent miscarriage.  ALLERGIES:  PENICILLIN, SULFA, and CEPHALOSPORINS.  ADMISSION MEDICATIONS: 1. Glucophage 850 mg p.o. t.i.d. 2. Humalog 30 units a.c. breakfast, 20 units a.c. lunch, and 30 units a.c. dinner.  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 She does live with her parents.  HOSPITAL COURSE: #1 - DIABETIC KETOACIDOSIS:  The patients initial CO2 was 9.  Arterial blood gas showed a pH of 7.133, pCO2 of 17, and pO2 of 135 on room air.  Urine was significant for 4+ glucose and 4+ ketones.  The patient was started on an insulin drip.  She was then converted to NPH and Humalog Insulin prior to discharge. She did require supplementation of her potassium and phosphorus.  The patient was seen by diabetic teaching nurse.  She was set up for further outpatient services and  diabetic education.   The patient was felt to be a type 2 diabetic prior to this  hospitalization.  However, with discontinuing her insulin she did present in DKA.  FINAL DIAGNOSIS:  Diabetic ketoacidosis.  DISCHARGE MEDICATIONS: 1. NPH Insulin 5 units q.12h. 2. Potassium 20 mEq q.8h. x 3 doses. 3. Humalog Insulin 30 units a.c. breakfast, 20 units a.c. lunch, and 30 units a.c.    dinner. 4. The patient was instructed not to take Glucophage.  DIET:  Strict diabetic.  FOLLOW-UP:  She was set up for an appointment Monday, November 11, 1999, at 4 p.m. [facility].  She was also scheduled for follow-up with Dr. Everardo Fitzgerald on Monday, October 21, 1999, at 4:15 p.m.  SPECIAL INSTRUCTIONS:  The patient was encouraged to follow a diabetic diet and to take her insulin.  She was encouraged to follow instructions for sick day insulin regimen. DD:  11/04/99 TD:  11/05/99 Job: [id number]

## 2011-02-07 NOTE — Op Note (Signed)
NAME:  [name], [name] NO.:  [id number]   MEDICAL RECORD NO.:  [id number]          PATIENT TYPE:  OBV   LOCATION:  5702                         FACILITY:  [facility]   PHYSICIAN:  Thornton Park. Daphine Deutscher, MD  DATE OF BIRTH:  1981/06/03   DATE OF PROCEDURE:  12/02/2006  DATE OF DISCHARGE:  12/03/2006                               OPERATIVE REPORT   PREOPERATIVE DIAGNOSIS:  Cholecystitis, cholelithiasis.   POSTOPERATIVE DIAGNOSIS:  Mild cholecystitis, cholelithiasis.   SURGEON:  Thornton Park. Daphine Deutscher, MD   ANESTHESIA:  General endotracheal.   PROCEDURE:  Laparoscopic cholecystectomy with intraoperative  cholangiogram.   DESCRIPTION OF PROCEDURE:  [name] is a 30 year old white  female diabetic with gallstones who was seen in the ED today and  admitted and taken to the OR.  Informed consent was obtained in the ED.  The abdomen was prepped with Techni-care and draped sterilely.  Longitudinal incision was made down to the umbilicus.  I made a small  hole in fascia and obliquely put in the Hasson trocar.  The abdomen was  insufflated and three trocars were placed in the upper abdomen.  Abdomen  was surveyed.  No other abnormalities noted.  The gallbladder was  grasped and elevated and the infundibulum was grasped and distracted  laterally.  I then took down all the adhesions in Calot's triangle and  got the critical view of demonstrating the cystic duct and cystic  artery.  I then put a clip on the cystic duct and cystic artery and made  a fenestration on the cystic duct with multiple attempts with a real  dull pair of scissors and then got a good pair of scissors eventually  making an incision and inserting the Reddick catheter.  A dynamic  cholangiogram showed a small common duct, free flow in the duodenum and  intrahepatic filling.  The cystic duct was then triple clipped, divided.  Cystic artery was triple clipped, divided and the gallbladder was  removed the  gallbladder bed with hook electrocautery without entering  it.  Once before detaching I surveyed the gallbladder bed.  No bleeding  or bile leaks noted.  Gallbladder was then placed in a bag and brought  out through the umbilicus.   The umbilical defect was repaired with two sutures of 0-0 Vicryl under  laparoscopic vision.  The high definition camera provided good  visualization of the case.  I then injected port sites with 0.5%  Marcaine, deflated the abdomen and closed skin with 4-0 Vicryl.  The  patient seemed to tolerate procedure well, was taken to recovery room in  satisfactory condition.      Thornton Park Daphine Deutscher, MD  Electronically Signed     MBM/MEDQ  D:  12/02/2006  T:  12/04/2006  Job:  [id number]

## 2011-02-07 NOTE — Assessment & Plan Note (Signed)
[name]                                 ON-CALL NOTE   NAME:[name], [name]                        MRN:          [id number]  DATE:08/31/2006                            DOB:          1981-07-22    PRIMARY CARE PHYSICIAN:  Dr. Everardo All.   Chapel calls in today stating that she needs a refill on the Humalog.  According to the patient, she called the office today, and she was told  that the medicine will most likely not be called in for the next couple  of days.  She states that she is running out tonight.  She does report  that she was getting samples from the office and that her last refill  was at [facility] at [facility].  The patient states that she is a type 1  diabetic, who is currently only being treated with Lantus and Humalog.   PLAN:  1. I did advise patient that she needs to call back to the office to      get more information regarding her medication refills.  She does      report that she saw Dr. Everardo All in October.  2. I will call in just one vial of the Humalog, as she is again aware      that she needs to call the office.  The patient expressed      understanding.     Leanne Chang, M.D.  Electronically Signed    LA/MedQ  DD: 08/31/2006  DT: 09/01/2006  Job #: [id number]

## 2011-02-07 NOTE — H&P (Signed)
[place]. [facility]  Patient:    CLORINE, SWING                       MRN: [id number] Adm. Date:  [id number] Disc. Date: [id number] Attending:  Tobin Chad Dictator:   Zella Ball, M.D.                         History and Physical  ADMISSION DIAGNOSES: 1. Diabetic ketoacidosis. 2. Diabetes mellitus, insulin dependent. 3. Hidradenitis. 4. Vaginal candidiasis.  PRESENTING HISTORY:  This is a 30 year old female with type 1 diabetes which she states had onset at age 17, who has had a problem with hidradenitis -- or recurrent boils -- in her perineal area.  She was in her usual state of health until the day prior to presentation when she developed a boil on her left labia which became very painful.  She had chills overnight.  The boil broke open on the date of presentation and had quite a bit of purulent drainage. Soon after this occurred, she developed nausea and vomiting, felt like she was short of breath and came to the emergency department.  Of note, patient states that she had been taking her insulin normally during this time.  REVIEW OF SYSTEMS:  Patient had been feeling in her normal state of health prior to yesterday.  She reports vomiting several times today prior to coming to the emergency department; otherwise, review of systems is negative.  PAST MEDICAL HISTORY: 1. Anxiety disorder. 2. Tobacco abuse, approximately one pack per day. 3. Depressive disorder, not otherwise specified. 4. Asthma.  MEDICATIONS ON PRESENTATION: 1. Lantus insulin 10 mg q.h.s. 2. Humalog, approximately 20 units q.a.c. 3. Albuterol p.r.n.  ALLERGIES:  Patient is allergic to PENICILLIN and to SULFA DRUGS.  FAMILY HISTORY:  Significant for a sister, an uncle and a grandparent with type 2 diabetes; also, her sister also gets recurrent hidradenitis.  PHYSICAL EXAMINATION:  VITAL SIGNS:  Patient afebrile.  Heart rate 113.  Respiratory rate 24.   Blood pressure 137/71.  GENERAL:  This was a distressed-appearing patient who was breathing rapidly and appeared to be in pain.  Patients judgement and insight, however, were normal.  HEENT:  Normocephalic, atraumatic.  No masses noted.  RESPIRATORY:  Patient appeared to be tachypneic but auscultation of the lungs was clear.  Patient appeared to be in Kussmaul breathing pattern.  CARDIOVASCULAR:  Regular rate and rhythm without murmurs.  Patient had no peripheral edema.  ABDOMEN:  No masses or tenderness.  Liver and spleen nonpalpable.  PELVIC:  Perineal examination revealed a 10 x 5-cm erythematous area on the left labia which was raised and had what appeared to be a 1 x 1-cm sinus opening from it with purulent material draining from it.  It was extremely tender to palpation.  EXTREMITIES:  Patient also had smaller, what appeared to be deep tissue infection in her thighs bilaterally and her thighs were quite erythematous.  INITIAL LABORATORY DATA:  WBC 16.3, PMNs 80%, lymphocytes 13%, hemoglobin 16, platelets 323,000.  Chem-7:  Sodium 139, potassium 3.8, chloride 115, bicarb less than 10, BUN 9, creatinine 1.0, glucose 225, calcium 8.1.  Arterial blood gas:  PCO2 8.6, PO2 129, pH 7.04, bicarb of 2; this gave patient an anion gap using the bicarb from ABG of 26.  UA:  Specific gravity was 1.026, pH 5, glucose greater than 1000, ketones greater than 80, protein 100,  nitrates, LE and bilirubin were negative.  Beta hCG was negative.  ADMISSION ASSESSMENT:  This is a 30 year old patient with insulin-dependent diabetes who appeared to be in quite significant diabetic ketoacidosis, likely touched off by her very severe perineal hidradenitis.  INITIAL PLAN: 1. DKA:  With DKA management, patient will be begun on insulin drip with D-5    half normal saline.  Will try to keep glucose greater than 150.  Check CBGs    every hour and ABGs and BMETs every three hours until bicarb is at  least    above 15. 2. Hidradenitis furuncle:  Patient will be begun on Ancef and gentamicin IV,    hot packs to perineal area in attempt to further drain.  Cultures of the    wound were sent. 3. Vaginal candidiasis.  Patient was given one dose of Diflucan 150 mg. DD:  01/21/01 TD:  01/23/01 Job: [id number]

## 2011-02-07 NOTE — Discharge Summary (Signed)
[place]. [facility]  Patient:    [name], [name]                       MRN: [id number] Adm. Date:  [id number] Disc. Date: [id number] Attending:  Tobin Chad Dictator:   Zella Ball, M.D.                           Discharge Summary  DISCHARGE LABORATORY DATA:  Blood culture:  No growth x 3 days.  Wound culture:  Abundant gram-positive rods, a few group B streptococcus.  CBC on Jan 20, 2001:  White blood cell count of 4.4, hemoglobin of 13.6, platelets of 259.  Sodium 139, potassium 3.2, chloride 107, bicarbonate 25, glucose 319, BUN 5, creatinine 0.6, calcium 8.8.  Hemoglobin A1C 12.5.  The beta hCG was negative. DD:  01/21/01 TD:  01/23/01 Job: [id number]

## 2011-02-18 ENCOUNTER — Other Ambulatory Visit: Payer: Self-pay | Admitting: Internal Medicine

## 2011-02-18 ENCOUNTER — Ambulatory Visit (INDEPENDENT_AMBULATORY_CARE_PROVIDER_SITE_OTHER)
Admission: RE | Admit: 2011-02-18 | Discharge: 2011-02-18 | Disposition: A | Discharge status: Home / Self Care | Payer: Commercial Managed Care - PPO | Source: Ambulatory Visit | Attending: Internal Medicine | Admitting: Internal Medicine

## 2011-02-18 DIAGNOSIS — J209 Acute bronchitis, unspecified: Secondary | ICD-10-CM

## 2011-02-21 ENCOUNTER — Other Ambulatory Visit: Payer: Self-pay | Admitting: Internal Medicine

## 2011-02-21 MED ORDER — VARENICLINE TARTRATE 0.5 MG PO TABS: ORAL_TABLET | ORAL | Status: DC

## 2011-02-21 MED ORDER — VARENICLINE TARTRATE 1 MG PO TABS: ORAL_TABLET | ORAL | Status: DC

## 2011-03-12 ENCOUNTER — Ambulatory Visit: Payer: Commercial Managed Care - PPO

## 2011-03-12 ENCOUNTER — Other Ambulatory Visit: Payer: Self-pay | Admitting: Internal Medicine

## 2011-03-12 DIAGNOSIS — J309 Allergic rhinitis, unspecified: Secondary | ICD-10-CM

## 2011-03-13 LAB — ALLERGY FULL PROFILE
Alternaria Alternata: <0.1 kU/L (ref <0.35)
Aspergillus fumigatus, IgG: 0.15 kU/L (ref <0.35)
Bahia Grass: 2.38 kU/L — ABNORMAL HIGH (ref <0.35)
Bermuda Grass: 0.77 kU/L — ABNORMAL HIGH (ref <0.35)
Candida Albicans: 0.79 kU/L — ABNORMAL HIGH (ref <0.35)
Cat Dander: 30.2 kU/L — ABNORMAL HIGH (ref <0.35)
D. farinae: 17.5 kU/L — ABNORMAL HIGH (ref <0.35)
Elm IgE: 0.28 kU/L (ref <0.35)
G009 Red Top: 14 kU/L — ABNORMAL HIGH (ref <0.35)
Lamb's Quarters: 0.29 kU/L (ref <0.35)
Plantain: 0.19 kU/L (ref <0.35)
Sycamore Tree: 0.47 kU/L — ABNORMAL HIGH (ref <0.35)

## 2011-04-24 ENCOUNTER — Telehealth: Payer: Self-pay | Admitting: Internal Medicine

## 2011-04-24 ENCOUNTER — Other Ambulatory Visit (INDEPENDENT_AMBULATORY_CARE_PROVIDER_SITE_OTHER): Payer: 59

## 2011-04-24 ENCOUNTER — Other Ambulatory Visit: Payer: Self-pay | Admitting: Internal Medicine

## 2011-04-24 ENCOUNTER — Ambulatory Visit (HOSPITAL_COMMUNITY)
Admission: RE | Admit: 2011-04-24 | Discharge: 2011-04-24 | Disposition: A | Discharge status: Home / Self Care | Payer: 59 | Source: Ambulatory Visit | Attending: Internal Medicine | Admitting: Internal Medicine

## 2011-04-24 ENCOUNTER — Ambulatory Visit (INDEPENDENT_AMBULATORY_CARE_PROVIDER_SITE_OTHER)
Admission: RE | Admit: 2011-04-24 | Discharge: 2011-04-24 | Disposition: A | Discharge status: Home / Self Care | Payer: 59 | Source: Ambulatory Visit | Attending: Internal Medicine | Admitting: Internal Medicine

## 2011-04-24 DIAGNOSIS — M79609 Pain in unspecified limb: Secondary | ICD-10-CM

## 2011-04-24 DIAGNOSIS — R0600 Dyspnea, unspecified: Secondary | ICD-10-CM

## 2011-04-24 DIAGNOSIS — I471 Supraventricular tachycardia: Secondary | ICD-10-CM

## 2011-04-24 DIAGNOSIS — R0989 Other specified symptoms and signs involving the circulatory and respiratory systems: Secondary | ICD-10-CM

## 2011-04-24 DIAGNOSIS — R000 Tachycardia, unspecified: Secondary | ICD-10-CM | POA: Insufficient documentation

## 2011-04-24 DIAGNOSIS — R0602 Shortness of breath: Secondary | ICD-10-CM | POA: Insufficient documentation

## 2011-04-24 DIAGNOSIS — R0609 Other forms of dyspnea: Secondary | ICD-10-CM

## 2011-04-24 LAB — BASIC METABOLIC PANEL
BUN: 20 mg/dL (ref 6–23)
Calcium: 8.9 mg/dL (ref 8.4–10.5)
Creatinine, Ser: 1 mg/dL (ref 0.4–1.2)
GFR: 73.6 mL/min (ref >60.00)
Glucose, Bld: 272 mg/dL — ABNORMAL HIGH (ref 70–99)

## 2011-04-24 LAB — CBC WITH DIFFERENTIAL/PLATELET
Basophils Absolute: 0.1 10*3/uL (ref 0.0–0.1)
Lymphocytes Relative: 14.2 % (ref 12.0–46.0)
Monocytes Relative: 2.8 % — ABNORMAL LOW (ref 3.0–12.0)
Platelets: 334 10*3/uL (ref 150.0–400.0)
RDW: 13 % (ref 11.5–14.6)

## 2011-04-24 MED ORDER — CIPROFLOXACIN HCL 500 MG PO TABS: 500.0000 mg | ORAL_TABLET | Freq: Two times a day (BID) | ORAL | Status: AC

## 2011-04-24 NOTE — Telephone Encounter (Signed)
Discussed labs with Andriana. Her internist felt she was dehydrated this afternoon. She was not willing to be admitted but agreed to work on hydration. Discussed her labs. Denies fever, admits some chill. Discussed med allergy including quinolones. She has taken cipro ok before. I called in to [employer] [facility] Cipro 500 mg, # 14, 1 bid.

## 2011-04-24 NOTE — Telephone Encounter (Signed)
Addended by: Anselmo Rod on: 04/24/2011 09:37 AM   Modules accepted: Orders

## 2011-04-24 NOTE — Telephone Encounter (Signed)
Thula this morning reports tachpalpitation, dyspnea, weakness, pain in lower left leg. Exam- RA sat 100%, HR regular 123, no murmur. No JVD             Lungs clear, no accessory muscle use             Positive Homan's left calf Imp- Diabetic. Concerned possible DVT/PE Plan- CXR, CBC, BMET, D-Dimer. Trying to set up early leg vein Dopplers at [facility] vascular lab.

## 2011-04-25 LAB — D-DIMER, QUANTITATIVE: D-Dimer, Quant: 0.71 ug/mL-FEU — ABNORMAL HIGH (ref 0.00–0.48)

## 2011-05-12 ENCOUNTER — Inpatient Hospital Stay (INDEPENDENT_AMBULATORY_CARE_PROVIDER_SITE_OTHER)
Admission: RE | Admit: 2011-05-12 | Discharge: 2011-05-12 | Disposition: A | Discharge status: Home / Self Care | Payer: 59 | Source: Ambulatory Visit | Attending: Emergency Medicine | Admitting: Emergency Medicine

## 2011-05-12 DIAGNOSIS — J40 Bronchitis, not specified as acute or chronic: Secondary | ICD-10-CM

## 2011-06-13 LAB — I-STAT 8, (EC8 V) (CONVERTED LAB)
Acid-base deficit: 1
Bicarbonate: 24.7 — ABNORMAL HIGH
Hemoglobin: 15.6 — ABNORMAL HIGH
Potassium: 3.9
Sodium: 138
TCO2: 26
pH, Ven: 7.38 — ABNORMAL HIGH

## 2011-06-16 LAB — BASIC METABOLIC PANEL
BUN: 3 — ABNORMAL LOW
BUN: 4 — ABNORMAL LOW
BUN: 5 — ABNORMAL LOW
BUN: 5 — ABNORMAL LOW
BUN: 6
CO2: 16 — ABNORMAL LOW
CO2: 22
CO2: 23
CO2: 27
CO2: 9 — CL
CO2: 9 — CL
Calcium: 8 — ABNORMAL LOW
Calcium: 8 — ABNORMAL LOW
Calcium: 8.2 — ABNORMAL LOW
Calcium: 8.3 — ABNORMAL LOW
Calcium: 8.4
Chloride: 107
Chloride: 108
Chloride: 109
Chloride: 109
Chloride: 113 — ABNORMAL HIGH
Chloride: 115 — ABNORMAL HIGH
Creatinine, Ser: 0.49
Creatinine, Ser: 0.55
Creatinine, Ser: 0.55
Creatinine, Ser: 0.55
Creatinine, Ser: 0.68
Creatinine, Ser: 0.91
Creatinine, Ser: 1.08
GFR calc Af Amer: >60
GFR calc Af Amer: >60
GFR calc non Af Amer: >60
GFR calc non Af Amer: >60
GFR calc non Af Amer: >60
Glucose, Bld: 134 — ABNORMAL HIGH
Glucose, Bld: 136 — ABNORMAL HIGH
Glucose, Bld: 149 — ABNORMAL HIGH
Glucose, Bld: 170 — ABNORMAL HIGH
Glucose, Bld: 177 — ABNORMAL HIGH
Glucose, Bld: 99
Potassium: 3.2 — ABNORMAL LOW
Potassium: 3.2 — ABNORMAL LOW
Potassium: 3.2 — ABNORMAL LOW
Sodium: 133 — ABNORMAL LOW
Sodium: 134 — ABNORMAL LOW
Sodium: 139

## 2011-06-16 LAB — URINE CULTURE
Colony Count: >=100000
Colony Count: NO GROWTH

## 2011-06-16 LAB — HEPATIC FUNCTION PANEL
Albumin: 4.6
Bilirubin, Direct: 0.2
Indirect Bilirubin: 1.5 — ABNORMAL HIGH
Total Bilirubin: 1.7 — ABNORMAL HIGH

## 2011-06-16 LAB — URINALYSIS, ROUTINE W REFLEX MICROSCOPIC
Bilirubin Urine: NEGATIVE
Bilirubin Urine: NEGATIVE
Glucose, UA: 100 — AB
Ketones, ur: 15 — AB
Ketones, ur: >80 — AB
Leukocytes, UA: NEGATIVE
Leukocytes, UA: NEGATIVE
Nitrite: NEGATIVE
Protein, ur: 100 — AB
Protein, ur: NEGATIVE
Urobilinogen, UA: 0.2
pH: 6.5

## 2011-06-16 LAB — I-STAT 8, (EC8 V) (CONVERTED LAB)
BUN: 22
Bicarbonate: 6.5 — ABNORMAL LOW
Chloride: 109
Glucose, Bld: 334 — ABNORMAL HIGH
HCT: 54 — ABNORMAL HIGH
Hemoglobin: 18.4 — ABNORMAL HIGH
pCO2, Ven: 21.6 — ABNORMAL LOW

## 2011-06-16 LAB — LIPASE, BLOOD: Lipase: 15

## 2011-06-16 LAB — LIPID PANEL
LDL Cholesterol: 86
Total CHOL/HDL Ratio: 3.9
Triglycerides: 179 — ABNORMAL HIGH
VLDL: 36

## 2011-06-16 LAB — URINE MICROSCOPIC-ADD ON

## 2011-06-16 LAB — CULTURE, BLOOD (ROUTINE X 2): Culture: NO GROWTH

## 2011-06-16 LAB — DIFFERENTIAL
Basophils Relative: 0
Eosinophils Absolute: 0.1
Eosinophils Relative: 0
Monocytes Relative: 6
Neutrophils Relative %: 77

## 2011-06-16 LAB — CBC
HCT: 37.5
HCT: 48.2 — ABNORMAL HIGH
Hemoglobin: 12.9
MCHC: 33.4
MCV: 92.2
MCV: 94.3
RBC: 5.1
WBC: 10.7 — ABNORMAL HIGH

## 2011-06-16 LAB — HEMOGLOBIN A1C
Hgb A1c MFr Bld: 11.2 — ABNORMAL HIGH
Mean Plasma Glucose: 321

## 2011-06-17 ENCOUNTER — Ambulatory Visit (INDEPENDENT_AMBULATORY_CARE_PROVIDER_SITE_OTHER): Payer: 59

## 2011-06-17 DIAGNOSIS — Z23 Encounter for immunization: Secondary | ICD-10-CM

## 2011-06-17 MED ORDER — TETANUS-DIPHTH-ACELL PERTUSSIS 5-2.5-18.5 LF-MCG/0.5 IM SUSP: 0.5000 mL | Freq: Once | INTRAMUSCULAR | Status: DC

## 2011-06-20 LAB — POCT I-STAT, CHEM 8
Creatinine, Ser: 0.9
Glucose, Bld: 491 — ABNORMAL HIGH
Hemoglobin: 15.3 — ABNORMAL HIGH
TCO2: 13

## 2011-06-20 LAB — BASIC METABOLIC PANEL
BUN: 15
GFR calc non Af Amer: >60
Glucose, Bld: 322 — ABNORMAL HIGH
Potassium: 4.4

## 2011-06-20 LAB — GLUCOSE, CAPILLARY: Glucose-Capillary: 267 — ABNORMAL HIGH

## 2011-06-20 LAB — URINALYSIS, ROUTINE W REFLEX MICROSCOPIC
Glucose, UA: >1000 — AB
Leukocytes, UA: NEGATIVE
Nitrite: NEGATIVE
Specific Gravity, Urine: 1.036 — ABNORMAL HIGH
Urobilinogen, UA: 0.2
pH: 5

## 2011-06-20 LAB — BLOOD GAS, VENOUS
Acid-base deficit: 6.8 — ABNORMAL HIGH
O2 Saturation: 90.6
pO2, Ven: 60.8 — ABNORMAL HIGH

## 2011-06-23 LAB — CULTURE, ROUTINE-ABSCESS

## 2011-06-25 LAB — BASIC METABOLIC PANEL
BUN: 13
BUN: 14
CO2: 20
CO2: 20
CO2: 26
Calcium: 7.3 — ABNORMAL LOW
Calcium: 7.7 — ABNORMAL LOW
Calcium: 7.9 — ABNORMAL LOW
Chloride: 111
Chloride: 111
Chloride: 114 — ABNORMAL HIGH
Chloride: 116 — ABNORMAL HIGH
Creatinine, Ser: 0.52
Creatinine, Ser: 0.58
Creatinine, Ser: 0.77
GFR calc Af Amer: >60
GFR calc Af Amer: >60
GFR calc Af Amer: >60
GFR calc Af Amer: >60
GFR calc non Af Amer: >60
GFR calc non Af Amer: >60
GFR calc non Af Amer: >60
Glucose, Bld: 115 — ABNORMAL HIGH
Glucose, Bld: 96
Potassium: 2.8 — ABNORMAL LOW
Potassium: 3.2 — ABNORMAL LOW
Potassium: 3.2 — ABNORMAL LOW
Potassium: 3.4 — ABNORMAL LOW
Potassium: 3.6
Sodium: 135
Sodium: 136
Sodium: 140

## 2011-06-25 LAB — URINALYSIS, ROUTINE W REFLEX MICROSCOPIC
Bilirubin Urine: NEGATIVE
Glucose, UA: >1000 — AB
Ketones, ur: >80 — AB
Leukocytes, UA: NEGATIVE
Nitrite: NEGATIVE
Protein, ur: 100 — AB
Specific Gravity, Urine: 1.024
Urobilinogen, UA: 0.2
pH: 5

## 2011-06-25 LAB — GLUCOSE, CAPILLARY
Glucose-Capillary: 111 — ABNORMAL HIGH
Glucose-Capillary: 114 — ABNORMAL HIGH
Glucose-Capillary: 119 — ABNORMAL HIGH
Glucose-Capillary: 121 — ABNORMAL HIGH
Glucose-Capillary: 123 — ABNORMAL HIGH
Glucose-Capillary: 133 — ABNORMAL HIGH
Glucose-Capillary: 144 — ABNORMAL HIGH
Glucose-Capillary: 148 — ABNORMAL HIGH
Glucose-Capillary: 153 — ABNORMAL HIGH
Glucose-Capillary: 170 — ABNORMAL HIGH
Glucose-Capillary: 182 — ABNORMAL HIGH
Glucose-Capillary: 207 — ABNORMAL HIGH
Glucose-Capillary: 212 — ABNORMAL HIGH
Glucose-Capillary: 215 — ABNORMAL HIGH
Glucose-Capillary: 229 — ABNORMAL HIGH
Glucose-Capillary: 248 — ABNORMAL HIGH
Glucose-Capillary: 274 — ABNORMAL HIGH
Glucose-Capillary: 274 — ABNORMAL HIGH
Glucose-Capillary: 462 — ABNORMAL HIGH
Glucose-Capillary: 84
Glucose-Capillary: 84
Glucose-Capillary: 99

## 2011-06-25 LAB — CBC
HCT: 37.3
HCT: 51.1 — ABNORMAL HIGH
Hemoglobin: 16.8 — ABNORMAL HIGH
MCHC: 32.8
MCV: 98.2
Platelets: 311
Platelets: 541 — ABNORMAL HIGH
RBC: 5.21 — ABNORMAL HIGH
RDW: 12.7
RDW: 13.2
WBC: 16.8 — ABNORMAL HIGH
WBC: 22.9 — ABNORMAL HIGH

## 2011-06-25 LAB — HEPATIC FUNCTION PANEL
ALT: 10
Alkaline Phosphatase: 72
Bilirubin, Direct: 0.2
Indirect Bilirubin: 0.6
Total Bilirubin: 0.8

## 2011-06-25 LAB — COMPREHENSIVE METABOLIC PANEL
ALT: 18
AST: 29
Albumin: 4.6
Alkaline Phosphatase: 130 — ABNORMAL HIGH
BUN: 21
CO2: 5 — CL
Calcium: 9.1
Chloride: 102
Creatinine, Ser: 1.45 — ABNORMAL HIGH
GFR calc Af Amer: 53 — ABNORMAL LOW
GFR calc non Af Amer: 44 — ABNORMAL LOW
Glucose, Bld: 450 — ABNORMAL HIGH
Potassium: 5.4 — ABNORMAL HIGH
Sodium: 132 — ABNORMAL LOW
Total Bilirubin: 2.1 — ABNORMAL HIGH
Total Protein: 7.8

## 2011-06-25 LAB — DIFFERENTIAL
Basophils Absolute: 0.1
Basophils Relative: 0
Eosinophils Absolute: 0.2
Eosinophils Relative: 1
Lymphocytes Relative: 11 — ABNORMAL LOW
Lymphs Abs: 2.5
Monocytes Absolute: 0.8
Monocytes Relative: 4
Neutro Abs: 19.3 — ABNORMAL HIGH
Neutrophils Relative %: 84 — ABNORMAL HIGH

## 2011-06-25 LAB — LIPID PANEL
HDL: 30 — ABNORMAL LOW
LDL Cholesterol: 72
Triglycerides: 55

## 2011-06-25 LAB — URINE MICROSCOPIC-ADD ON

## 2011-06-25 LAB — KETONES, QUALITATIVE

## 2011-06-25 LAB — MAGNESIUM: Magnesium: 2.4

## 2011-06-25 LAB — POCT PREGNANCY, URINE: Preg Test, Ur: NEGATIVE

## 2011-06-27 LAB — CULTURE, ROUTINE-ABSCESS: Gram Stain: NONE SEEN

## 2011-07-28 ENCOUNTER — Telehealth: Payer: Self-pay | Admitting: Internal Medicine

## 2011-07-28 MED ORDER — AZITHROMYCIN 250 MG PO TABS: ORAL_TABLET | ORAL | Status: AC

## 2011-07-28 NOTE — Telephone Encounter (Signed)
Increased wheezy cough, nasal and chest congestion,  Imp URI w/ bronchitis. Likely viral, but will cover with z pak, neb.

## 2011-08-01 ENCOUNTER — Other Ambulatory Visit: Payer: Self-pay | Admitting: *Deleted

## 2011-08-01 MED ORDER — VARENICLINE TARTRATE 0.5 MG X 11 & 1 MG X 42 PO MISC: ORAL | Status: AC

## 2011-08-18 ENCOUNTER — Other Ambulatory Visit: Payer: Self-pay | Admitting: Internal Medicine

## 2011-08-18 ENCOUNTER — Other Ambulatory Visit (INDEPENDENT_AMBULATORY_CARE_PROVIDER_SITE_OTHER): Payer: 59

## 2011-08-18 DIAGNOSIS — R0602 Shortness of breath: Secondary | ICD-10-CM

## 2011-08-18 LAB — CBC WITH DIFFERENTIAL/PLATELET
Basophils Absolute: 0 10*3/uL (ref 0.0–0.1)
HCT: 43.3 % (ref 36.0–46.0)
Lymphs Abs: 3 10*3/uL (ref 0.7–4.0)
MCHC: 33.6 g/dL (ref 30.0–36.0)
MCV: 92.9 fl (ref 78.0–100.0)
Monocytes Absolute: 0.5 10*3/uL (ref 0.1–1.0)
Neutro Abs: 6.3 10*3/uL (ref 1.4–7.7)
Platelets: 338 10*3/uL (ref 150.0–400.0)
RDW: 13 % (ref 11.5–14.6)

## 2011-08-18 LAB — COMPREHENSIVE METABOLIC PANEL
ALT: 12 U/L (ref 0–35)
Alkaline Phosphatase: 96 U/L (ref 39–117)
Creatinine, Ser: 0.8 mg/dL (ref 0.4–1.2)
Sodium: 137 mEq/L (ref 135–145)
Total Bilirubin: 0.4 mg/dL (ref 0.3–1.2)
Total Protein: 7.4 g/dL (ref 6.0–8.3)

## 2011-10-23 ENCOUNTER — Telehealth: Payer: Self-pay | Admitting: Internal Medicine

## 2011-10-23 DIAGNOSIS — J329 Chronic sinusitis, unspecified: Secondary | ICD-10-CM

## 2011-10-23 MED ORDER — CEFDINIR 300 MG PO CAPS: 300.0000 mg | ORAL_CAPSULE | Freq: Two times a day (BID) | ORAL | Status: AC

## 2011-10-23 NOTE — Telephone Encounter (Signed)
Per CDY: cefdinir 300mg  #20, 1 po BID for sinusitis.  rx sent to verified pharmacy.  Pt is aware.

## 2011-10-31 ENCOUNTER — Encounter (HOSPITAL_COMMUNITY): Payer: Self-pay

## 2011-10-31 ENCOUNTER — Emergency Department (HOSPITAL_COMMUNITY)
Admission: EM | Admit: 2011-10-31 | Discharge: 2011-10-31 | Disposition: A | Discharge status: Home / Self Care | Payer: 59 | Source: Home / Self Care | Attending: Emergency Medicine | Admitting: Emergency Medicine

## 2011-10-31 DIAGNOSIS — J029 Acute pharyngitis, unspecified: Secondary | ICD-10-CM

## 2011-10-31 HISTORY — DX: Gastro-esophageal reflux disease without esophagitis: K21.9

## 2011-10-31 LAB — POCT INFECTIOUS MONO SCREEN: Mono Screen: NEGATIVE

## 2011-10-31 MED ORDER — ALUM & MAG HYDROXIDE-SIMETH 200-200-20 MG/5ML PO SUSP: 5.0000 mL | Freq: Four times a day (QID) | ORAL | Status: AC | PRN

## 2011-10-31 MED ORDER — DIPHENHYDRAMINE HCL 12.5 MG/5ML PO LIQD: 12.5000 mg | Freq: Four times a day (QID) | ORAL | Status: DC | PRN

## 2011-10-31 MED ORDER — LIDOCAINE VISCOUS 2 % MT SOLN: 10.0000 mL | Freq: Three times a day (TID) | OROMUCOSAL | Status: AC | PRN

## 2011-10-31 MED ORDER — LIDOCAINE VISCOUS 2 % MT SOLN
20.0000 mL | Freq: Once | OROMUCOSAL | Status: AC
  Administered 2011-10-31: 20 mL via OROMUCOSAL

## 2011-10-31 NOTE — ED Notes (Addendum)
C/o sick for past 2 weeks w cough, fever, congestion, glands swollen, ear pain; has reportedly completed doxycycline  Course, and is now on Omnicef for her syx ; post nasopharynx non-reddened, uvula midline

## 2011-11-01 NOTE — ED Provider Notes (Signed)
History     CSN: [id number]  Arrival date & time 10/31/11  1845   First MD Initiated Contact with Patient 10/31/11 2028      Chief Complaint  Patient presents with  . Otalgia    (Consider location/radiation/quality/duration/timing/severity/associated sxs/prior treatment) HPI Comments: Patient with sore throat, nasal congestion, sinus pain/pressure, low-grade fevers, malaise, fatigue for 2 weeks. Patient was originally thought to have a sinus infection, and started on with doxycycline  without relief. was started on Omnicef, and her sinus infection is getting better, however, patient reports  worsening sore throat. Still taking Omnicef. Notes raspy voice starting several days ago. Complains of bilateral ear pain. No change in hearing Has been taking over-the-counter cold medications without improvement. No abdominal pain, rash. No known sick contacts. Denies reflux symptoms  ROS as noted in HPI. All other ROS negative.   Patient is a 31 y.o. female presenting with pharyngitis. The history is provided by the patient.  Sore Throat This is a new problem. The current episode started more than 1 week ago. The problem occurs constantly. The problem has been gradually worsening. The symptoms are aggravated by swallowing. The symptoms are relieved by nothing.    Past Medical History  Diagnosis Date  . Diabetes mellitus   . Asthma   . GERD (gastroesophageal reflux disease)     History reviewed. No pertinent past surgical history.  History reviewed. No pertinent family history.  History  Substance Use Topics  . Smoking status: Current Everyday Smoker  . Smokeless tobacco: Not on file  . Alcohol Use: No    OB History    Grav Para Term Preterm Abortions TAB SAB Ect Mult Living                  Review of Systems  Allergies  Cefaclor; Latex; Levofloxacin; Moxifloxacin; Oxycodone-acetaminophen; Penicillins; Promethazine hcl; Propoxyphene n-acetaminophen; Rosiglitazone maleate; and  Sulfadiazine  Home Medications   Current Outpatient Rx  Name Route Sig Dispense Refill  . INSULIN LISPRO (HUMAN) 100 UNIT/ML [place] SOLN Subcutaneous Inject into the skin 3 (three) times daily before meals.    Marland Kitchen ALUM & MAG HYDROXIDE-SIMETH 200-200-20 MG/5ML PO SUSP Oral Take 5 mLs by mouth every 6 (six) hours as needed for indigestion. 150 mL 0  . CEFDINIR 300 MG PO CAPS Oral Take 1 capsule (300 mg total) by mouth 2 (two) times daily. 20 capsule 0  . DIPHENHYDRAMINE HCL 12.5 MG/5ML PO LIQD Oral Take 5 mLs (12.5 mg total) by mouth 4 (four) times daily as needed for allergies. 120 mL 0  . LIDOCAINE VISCOUS 2 % MT SOLN Oral Take 10 mLs by mouth 3 (three) times daily as needed for pain. Swish and spit. Do not swallow. 100 mL 0  . VARENICLINE TARTRATE 1 MG PO TABS  Continuity kit- as directed 60 tablet 2  . VARENICLINE TARTRATE 0.5 MG PO TABS  Starter kit- as directed 60 tablet 0    BP 134/89  Pulse 86  Temp(Src) 98.3 F (36.8 C) (Oral)  Resp 20  SpO2 100%  LMP 10/27/2011  Physical Exam  Nursing note and vitals reviewed. Constitutional: She is oriented to person, place, and time. She appears well-developed and well-nourished.  HENT:  Head: Normocephalic and atraumatic. No trismus in the jaw.  Right Ear: Tympanic membrane normal.  Left Ear: Tympanic membrane normal.  Nose: Mucosal edema and rhinorrhea present. No epistaxis.  Mouth/Throat: Uvula is midline and mucous membranes are normal. Posterior oropharyngeal erythema present. No oropharyngeal exudate  or tonsillar abscesses.       (-) frontal, maxillary sinus tenderness. Tonsils erythematous. No vesicles on palate or oropharynx. Geographic tongue. no oral thrush.  Eyes: Conjunctivae and EOM are normal.  Neck: Normal range of motion. Neck supple.  Cardiovascular: Normal rate, regular rhythm and normal heart sounds.   Pulmonary/Chest: Effort normal and breath sounds normal.  Abdominal: Soft. She exhibits no distension. There is no  splenomegaly. There is no tenderness. There is no rebound, no guarding and no CVA tenderness.  Musculoskeletal: Normal range of motion.  Lymphadenopathy:    She has cervical adenopathy.  Neurological: She is alert and oriented to person, place, and time.  Skin: Skin is warm and dry. No rash noted.  Psychiatric: She has a normal mood and affect. Her behavior is normal. Judgment and thought content normal.    ED Course  Procedures (including critical care time)   Labs Reviewed  POCT INFECTIOUS MONO SCREEN  LAB REPORT - SCANNED   No results found.   1. Pharyngitis       MDM  Pharynx red, irritated. No evidence of esophageal candidiasis, Mono negative. Patient is already being treated for strep throat. Doubt pill esophagitis, as patient had sore throat beginning of her illness. Will treat symptomatically, with Benadryl and Mylanta., As this may have a component of reflux. Will have patient finish her Omnicef. If no improvement in several days, will refer to a gastroenterologist. Patient agrees with plan  Luiz Blare, MD 11/01/11 [phone]

## 2011-11-17 ENCOUNTER — Encounter (HOSPITAL_COMMUNITY): Payer: Self-pay | Admitting: *Deleted

## 2011-11-17 ENCOUNTER — Encounter (HOSPITAL_COMMUNITY)
Admission: RE | Disposition: A | Discharge status: Hospice | Payer: Self-pay | Source: Ambulatory Visit | Attending: Gastroenterology

## 2011-11-17 ENCOUNTER — Other Ambulatory Visit: Payer: Self-pay | Admitting: Gastroenterology

## 2011-11-17 ENCOUNTER — Ambulatory Visit (HOSPITAL_COMMUNITY)
Admission: RE | Admit: 2011-11-17 | Discharge: 2011-11-17 | Disposition: A | Discharge status: Hospice | Payer: 59 | Source: Ambulatory Visit | Attending: Gastroenterology | Admitting: Gastroenterology

## 2011-11-17 DIAGNOSIS — K219 Gastro-esophageal reflux disease without esophagitis: Secondary | ICD-10-CM | POA: Insufficient documentation

## 2011-11-17 DIAGNOSIS — B3781 Candidal esophagitis: Secondary | ICD-10-CM | POA: Insufficient documentation

## 2011-11-17 HISTORY — PX: ESOPHAGOGASTRODUODENOSCOPY: SHX5428

## 2011-11-17 LAB — GLUCOSE, CAPILLARY: Glucose-Capillary: 346 mg/dL — ABNORMAL HIGH (ref 70–99)

## 2011-11-17 SURGERY — EGD (ESOPHAGOGASTRODUODENOSCOPY)
Anesthesia: Moderate Sedation

## 2011-11-17 MED ORDER — FENTANYL CITRATE 0.05 MG/ML IJ SOLN
INTRAMUSCULAR | Status: AC
  Filled 2011-11-17: qty 4

## 2011-11-17 MED ORDER — MIDAZOLAM HCL 10 MG/2ML IJ SOLN
INTRAMUSCULAR | Status: DC | PRN
  Administered 2011-11-17 (×4): 2.5 mg via INTRAVENOUS

## 2011-11-17 MED ORDER — FENTANYL NICU IV SYRINGE 50 MCG/ML
INJECTION | INTRAMUSCULAR | Status: DC | PRN
  Administered 2011-11-17 (×3): 50 ug via INTRAVENOUS

## 2011-11-17 MED ORDER — BUTAMBEN-TETRACAINE-BENZOCAINE 2-2-14 % EX AERO
INHALATION_SPRAY | CUTANEOUS | Status: DC | PRN
  Administered 2011-11-17: 2 via TOPICAL

## 2011-11-17 MED ORDER — MIDAZOLAM HCL 10 MG/2ML IJ SOLN
INTRAMUSCULAR | Status: AC
  Filled 2011-11-17: qty 4

## 2011-11-17 MED ORDER — SODIUM CHLORIDE 0.9 % IV SOLN
Freq: Once | INTRAVENOUS | Status: AC
  Administered 2011-11-17: 500 mL via INTRAVENOUS

## 2011-11-17 MED ORDER — DIPHENHYDRAMINE HCL 50 MG/ML IJ SOLN
INTRAMUSCULAR | Status: AC
  Filled 2011-11-17: qty 1

## 2011-11-17 NOTE — Discharge Instructions (Signed)
Continue Dexilant daily. Will call you with the results of the esophageal brushing when available.

## 2011-11-17 NOTE — H&P (Signed)
  Date of Initial H&P: 11/06/11  History reviewed, patient examined, no change in status, stable for endoscopic procedure.

## 2011-11-17 NOTE — Interval H&P Note (Signed)
History and Physical Interval Note:  11/17/2011 10:52 AM  [name]  has presented today for surgery, with the diagnosis of acid reflux/heartburn  The various methods of treatment have been discussed with the patient and family. After consideration of risks, benefits and other options for treatment, the patient has consented to  Procedure(s) (LRB): ESOPHAGOGASTRODUODENOSCOPY (EGD) (N/A) as a surgical intervention .  The patients' history has been reviewed, patient examined, no change in status, stable for surgery.  I have reviewed the patients' chart and labs.  Questions were answered to the patient's satisfaction.     [name] C.

## 2011-11-17 NOTE — Brief Op Note (Signed)
See endopro note 

## 2011-11-17 NOTE — Op Note (Signed)
[facility] [address] [place], [place]  [address]  ENDOSCOPY PROCEDURE REPORT  PATIENT:  [name], [name]  MR#:  [id number] BIRTHDATE:  December 15, 1980, 30 yrs. old  GENDER:  female  ENDOSCOPIST:  Charlott Rakes, MD Referred by:  PROCEDURE DATE:  11/17/2011 PROCEDURE:  EGD w/brushings ASA CLASS:  Class II INDICATIONS:  GERD  MEDICATIONS:   Fentanyl 75 mcg IV, Versed 10 mg IV  TOPICAL ANESTHETIC:  Cetacaine spray X 2  DESCRIPTION OF PROCEDURE:   After the risks benefits and alternatives of the procedure were thoroughly explained, informed consent was obtained.  The Pentax Gastroscope [id number] endoscope was introduced through the mouth and advanced to the second portion of the duodenum, without limitations.  The instrument was slowly withdrawn as the mucosa was fully examined. <<PROCEDUREIMAGES>>  FINDINGS:  The endoscope was inserted into the oropharynx and esophagus was intubated.  The gastroesophageal junction was noted to be 36cm from the incisors. Endoscope was advanced into the stomach, which was unremarkable.  The endoscope was advanced to the duodenal bulb and second portion of duodenum which were unremarkable.  The endoscope was withdrawn back into the stomach and retroflexion was unrevealing. In the esophagus there were a few small white plaques in the mid-esophagus that could not be irrigated away. Esophageal brushing was done to check for Candida esophagitis.  COMPLICATIONS:  None  ENDOSCOPIC IMPRESSION:  1. Minimal Candida esophagitis -s/p brushing to confirm o/w normal EGD  RECOMMENDATIONS:   1. Await cytology and if positive for Candida then treat with Nystatin 2. Continue lifestyle modifications for reflux and daily Dexilant  REPEAT EXAM:  N/A  ______________________________ Charlott Rakes, MD  CC:  n. eSIGNEDCharlott Rakes at 11/17/2011 11:24 AM  Reynaldo Minium, [phone]

## 2011-11-18 ENCOUNTER — Encounter (HOSPITAL_COMMUNITY): Payer: Self-pay | Admitting: Gastroenterology

## 2011-11-26 ENCOUNTER — Other Ambulatory Visit: Payer: Self-pay | Admitting: Internal Medicine

## 2011-11-26 MED ORDER — ACRIVASTINE-PSEUDOEPHEDRINE 8-60 MG PO CAPS: ORAL_CAPSULE | ORAL | Status: DC

## 2011-11-26 NOTE — Telephone Encounter (Signed)
rx sent per CDY.

## 2011-12-03 ENCOUNTER — Other Ambulatory Visit: Payer: Self-pay | Admitting: Internal Medicine

## 2011-12-03 MED ORDER — ANTIPYRINE-BENZOCAINE 5.4-1.4 % OT SOLN: 3.0000 [drp] | OTIC | Status: AC | PRN

## 2011-12-03 NOTE — Progress Notes (Signed)
12/03/11- Sharp pain in ear canal. Nonocclusive cerumen with no erythema or fluid seen. TMJ seems ok. No adenopathy. Plan- Auralgan Rx sent to [facility] employee pharmacy

## 2011-12-23 ENCOUNTER — Emergency Department (HOSPITAL_COMMUNITY)
Admission: EM | Admit: 2011-12-23 | Discharge: 2011-12-23 | Disposition: A | Discharge status: Home / Self Care | Payer: 59 | Attending: Emergency Medicine | Admitting: Emergency Medicine

## 2011-12-23 ENCOUNTER — Encounter (HOSPITAL_COMMUNITY): Payer: Self-pay | Admitting: *Deleted

## 2011-12-23 DIAGNOSIS — L02419 Cutaneous abscess of limb, unspecified: Secondary | ICD-10-CM | POA: Insufficient documentation

## 2011-12-23 DIAGNOSIS — J45909 Unspecified asthma, uncomplicated: Secondary | ICD-10-CM | POA: Insufficient documentation

## 2011-12-23 DIAGNOSIS — R350 Frequency of micturition: Secondary | ICD-10-CM | POA: Insufficient documentation

## 2011-12-23 DIAGNOSIS — E109 Type 1 diabetes mellitus without complications: Secondary | ICD-10-CM | POA: Insufficient documentation

## 2011-12-23 DIAGNOSIS — Z79899 Other long term (current) drug therapy: Secondary | ICD-10-CM | POA: Insufficient documentation

## 2011-12-23 DIAGNOSIS — Z794 Long term (current) use of insulin: Secondary | ICD-10-CM | POA: Insufficient documentation

## 2011-12-23 DIAGNOSIS — K219 Gastro-esophageal reflux disease without esophagitis: Secondary | ICD-10-CM | POA: Insufficient documentation

## 2011-12-23 DIAGNOSIS — R7309 Other abnormal glucose: Secondary | ICD-10-CM

## 2011-12-23 DIAGNOSIS — R109 Unspecified abdominal pain: Secondary | ICD-10-CM | POA: Insufficient documentation

## 2011-12-23 DIAGNOSIS — R112 Nausea with vomiting, unspecified: Secondary | ICD-10-CM | POA: Insufficient documentation

## 2011-12-23 DIAGNOSIS — R6883 Chills (without fever): Secondary | ICD-10-CM | POA: Insufficient documentation

## 2011-12-23 DIAGNOSIS — M79609 Pain in unspecified limb: Secondary | ICD-10-CM | POA: Insufficient documentation

## 2011-12-23 LAB — URINALYSIS, ROUTINE W REFLEX MICROSCOPIC
Bilirubin Urine: NEGATIVE
Glucose, UA: >1000 mg/dL — AB
Ketones, ur: NEGATIVE mg/dL
Protein, ur: 30 mg/dL — AB
Urobilinogen, UA: 0.2 mg/dL (ref 0.0–1.0)

## 2011-12-23 LAB — URINE MICROSCOPIC-ADD ON

## 2011-12-23 LAB — PREGNANCY, URINE: Preg Test, Ur: NEGATIVE

## 2011-12-23 MED ORDER — SODIUM CHLORIDE 0.9 % IV BOLUS (SEPSIS)
1000.0000 mL | Freq: Once | INTRAVENOUS | Status: AC
  Administered 2011-12-23: 1000 mL via INTRAVENOUS

## 2011-12-23 MED ORDER — FLUCONAZOLE 200 MG PO TABS: 200.0000 mg | ORAL_TABLET | Freq: Every day | ORAL | Status: AC

## 2011-12-23 MED ORDER — ONDANSETRON HCL 4 MG/2ML IJ SOLN
4.0000 mg | INTRAMUSCULAR | Status: DC | PRN
  Administered 2011-12-23: 4 mg via INTRAVENOUS
  Filled 2011-12-23: qty 2

## 2011-12-23 NOTE — ED Notes (Signed)
cbg 152   

## 2011-12-23 NOTE — ED Provider Notes (Signed)
Medical screening examination/treatment/procedure(s) were performed by non-physician practitioner and as supervising physician I was immediately available for consultation/collaboration.   Gerhard Munch, MD 12/23/11 [phone]

## 2011-12-23 NOTE — ED Notes (Signed)
Pt states she started out with a boil on her left inner thigh. Pt states boil busted last night and she started to have nausea/ emesis. Pt states she has since started to become weak and is unable to hold down any po's

## 2011-12-23 NOTE — ED Notes (Addendum)
Pt cbg 59.  Gave orange juice. Mother at bedside.  Told to call if unable to drink OJ.  Will check back soon to see progress and advance diet if able.

## 2011-12-23 NOTE — ED Provider Notes (Signed)
History     CSN: [id number]  Arrival date & time 12/23/11  1056   First MD Initiated Contact with Patient 12/23/11 1212      Chief Complaint  Patient presents with  . Nausea  . Emesis    (Consider location/radiation/quality/duration/timing/severity/associated sxs/prior treatment) Patient is a 31 y.o. female presenting with vomiting, abscess, and abdominal pain.  Emesis  This is a new problem. The current episode started 3 to 5 hours ago. The problem occurs 5 to 10 times per day. The problem has been gradually improving. The emesis has an appearance of stomach contents. There has been no fever. Associated symptoms include abdominal pain (lower abdomen) and chills. Pertinent negatives include no arthralgias, no cough, no diarrhea, no fever, no headaches, no myalgias, no sweats and no URI.  Abscess  This is a new problem. The current episode started yesterday. The problem occurs rarely. The problem has been unchanged. Affected Location: left inner thigh. The problem is mild. The abscess is characterized by painfulness. The abscess first occurred at home. Associated symptoms include vomiting. Pertinent negatives include no anorexia, no decrease in physical activity, not sleeping less, not drinking less, no fever, no fussiness, not sleeping more, no diarrhea, no congestion, no rhinorrhea, no sore throat, no decreased responsiveness and no cough. There were no sick contacts.  Abdominal Pain The primary symptoms of the illness include abdominal pain (lower abdomen), nausea and vomiting. The primary symptoms of the illness do not include fever, fatigue, shortness of breath, diarrhea, hematemesis, hematochezia, dysuria, vaginal discharge or vaginal bleeding. The current episode started 3 to 5 hours ago. The onset of the illness was sudden. The problem has not changed since onset. Pain Location: diffuse lower. The abdominal pain does not radiate. The severity of the abdominal pain is 4/10. The abdominal  pain is relieved by nothing. The abdominal pain is exacerbated by vomiting.  Nausea began today. The nausea is associated with eating. The nausea is exacerbated by food, activity and motion.  Pregnant Now: LMP 11/21/2011, sexually active, birth control started in march  The patient has not had a change in bowel habit. Additional symptoms associated with the illness include chills and frequency. Symptoms associated with the illness do not include anorexia, diaphoresis, heartburn, constipation, urgency, hematuria or back pain. Significant associated medical issues include GERD, diabetes (Type 1 on insulin pump ) and gallstones (cholesectomy ). Significant associated medical issues do not include PUD, inflammatory bowel disease, sickle cell disease, liver disease, substance abuse, diverticulitis, HIV or cardiac disease.    Past Medical History  Diagnosis Date  . Diabetes mellitus   . Asthma   . GERD (gastroesophageal reflux disease)     Past Surgical History  Procedure Date  . Eye surgery   . Wisdom tooth extraction   . Tonsillectomy   . Breast surgery     rt lumpectomy  . Cholecystectomy   . Bunionectomy   . Esophagogastroduodenoscopy 11/17/2011    Procedure: ESOPHAGOGASTRODUODENOSCOPY (EGD);  Surgeon: Shirley Friar, MD;  Location: [facility];  Service: Endoscopy;  Laterality: N/A;    Family History  Problem Relation Age of Onset  . Anesthesia problems Neg Hx   . Hypotension Neg Hx   . Malignant hyperthermia Neg Hx   . Pseudochol deficiency Neg Hx     History  Substance Use Topics  . Smoking status: Passive Smoker    Types: Cigarettes    Last Attempt to Quit: 09/23/2011  . Smokeless tobacco: Not on file  . Alcohol Use:  No    OB History    Grav Para Term Preterm Abortions TAB SAB Ect Mult Living                  Review of Systems  Constitutional: Positive for chills. Negative for fever, diaphoresis, decreased responsiveness and fatigue.  HENT: Negative for  congestion, sore throat and rhinorrhea.   Respiratory: Negative for cough and shortness of breath.   Gastrointestinal: Positive for nausea, vomiting and abdominal pain (lower abdomen). Negative for heartburn, diarrhea, constipation, hematochezia, anorexia and hematemesis.  Genitourinary: Positive for frequency. Negative for dysuria, urgency, hematuria, vaginal bleeding and vaginal discharge.  Musculoskeletal: Negative for myalgias, back pain and arthralgias.  Skin:       abscess  Neurological: Negative for headaches.  All other systems reviewed and are negative.    Allergies  Cefaclor; Latex; Levofloxacin; Moxifloxacin; Promethazine hcl; Propoxacet-n; Rosiglitazone maleate; Sulfadiazine; Oxycodone-acetaminophen; and Penicillins  Home Medications   Current Outpatient Rx  Name Route Sig Dispense Refill  . ACRIVASTINE-PSEUDOEPHEDRINE 8-60 MG PO CAPS  1 cap by mouth every 4-6 hours as needed 10 each 0  . DEXLANSOPRAZOLE 30 MG PO CPDR Oral Take 30 mg by mouth daily.    . INSULIN LISPRO (HUMAN) 100 UNIT/ML [place] SOLN Subcutaneous Inject 75-100 Units into the skin continuous. Pt uses an insulin pump    . VILAZODONE HCL 20 MG PO TABS Oral Take 20 mg by mouth daily.      BP 122/81  Pulse 99  Temp(Src) 98.2 F (36.8 C) (Oral)  Resp 18  Ht 5\' 5"  (1.651 m)  Wt 233 lb (105.688 kg)  BMI 38.77 kg/m2  SpO2 100%  LMP 11/21/2011  Physical Exam  Nursing note and vitals reviewed. Constitutional: Vital signs are normal. She appears well-developed and well-nourished. No distress.  HENT:  Head: Normocephalic and atraumatic.  Mouth/Throat: Uvula is midline, oropharynx is clear and moist and mucous membranes are normal.  Eyes: Conjunctivae and EOM are normal. Pupils are equal, round, and reactive to light.  Neck: Normal range of motion and full passive range of motion without pain. Neck supple. No spinous process tenderness and no muscular tenderness present. No rigidity. No Brudzinski's sign noted.    Cardiovascular: Normal rate and regular rhythm.   Pulmonary/Chest: Effort normal and breath sounds normal. No accessory muscle usage. Not tachypneic. No respiratory distress.  Abdominal: Soft. Normal appearance. She exhibits no distension, no ascites, no pulsatile midline mass and no mass. There is generalized tenderness. There is no CVA tenderness. No hernia.         Diffuse abdominal ttp, no localization,  normal bowel sounds  Lymphadenopathy:    She has no cervical adenopathy.  Neurological: She is alert.  Skin: Skin is warm and dry. No rash noted. She is not diaphoretic.       Marland Kitchen5 cm abscess without warmth, erythema, fluctuation, or induration. Located left inner thigh.   Psychiatric: She has a normal mood and affect. Her speech is normal and behavior is normal.    ED Course  Procedures (including critical care time)  Labs Reviewed  GLUCOSE, CAPILLARY - Abnormal; Notable for the following:    Glucose-Capillary 152 (*)    All other components within normal limits  URINALYSIS, ROUTINE W REFLEX MICROSCOPIC - Abnormal; Notable for the following:    APPearance CLOUDY (*)    Glucose, UA >1000 (*)    Hgb urine dipstick SMALL (*)    Protein, ur 30 (*)    Leukocytes, UA SMALL (*)  All other components within normal limits  GLUCOSE, CAPILLARY - Abnormal; Notable for the following:    Glucose-Capillary 59 (*)    All other components within normal limits  GLUCOSE, CAPILLARY - Abnormal; Notable for the following:    Glucose-Capillary 153 (*)    All other components within normal limits  URINE MICROSCOPIC-ADD ON - Abnormal; Notable for the following:    Squamous Epithelial / LPF MANY (*)    Bacteria, UA MANY (*)    All other components within normal limits  PREGNANCY, URINE  URINE CULTURE   No results found.   No diagnosis found.    MDM  Patient with symptoms consistent with viral gastroenteritis.  Vitals are stable, no fever.  No signs of dehydration, tolerating PO fluids >  6 oz.  Lungs are clear.  No focal abdominal pain, no concern for appendicitis, cholecystitis, pancreatitis, ruptured viscus, kidney stone, or any other abdominal etiology.  Supportive therapy indicated with return if symptoms worsen.  Patient counseled. Pt abscess being followed by her PCP and pt is taking doxy for it. Urine culture pending, pt treated for mild yeast on UA.          Jaci Carrel, [place] 12/23/11 1544

## 2011-12-23 NOTE — Discharge Instructions (Signed)
Follow up with your primary care doctor about your hospital visit. Continue to hydrate orally.Take all medications as prescribed & use Zofran as directed for nausea & vomiting.  Read the instructions below for reasons to return to the ER.  ° °The 'BRAT' diet is suggested, then progress to diet as tolerated as symptoms abate. Call if bloody stools, persistent diarrhea, vomiting, fever or abdominal pain. °Bananas.  °Rice.  °Applesauce.  °Toast (and other simple starches such as crackers, potatoes, noodles).  ° °SEEK IMMEDIATE MEDICAL ATTENTION IF: ° °You begin having localized abdominal pain that does not go away or becomes severe (The right side could  possibly be appendicitis. In an adult, the left lower portion of the abdomen could be colitis or diverticulitis) °  °A temperature above 101 develops ° °Repeated vomiting occurs (multiple uncontrollable episodes) or you are unable to keep fluids down ° °Blood is being passed in stools or vomit (bright red or black tarry stools).  ° °Return also if you develop chest pain, difficulty breathing, dizziness or fainting, or become confused, poorly responsive, or inconsolable (young children). ° ° °RESOURCE GUIDE ° °Dental Problems ° °Patients with Medicaid: °[facility]                     [facility] °[address]                                           [address] °Phone:  [phone]                                                  Phone:  510-2600 ° °If unable to pay or uninsured, contact:  [facility] or [facility] to become qualified for the adult dental clinic. ° °Chronic Pain Problems °Contact [facility]  [phone] °Patients need to be referred by their primary care doctor. ° °Insufficient Money for Medicine °Contact [employer]:  call "211" or [facility] 271-5999. ° °No Primary Care Doctor °Call [facility]  832-8000 °Other agencies that provide inexpensive medical care °   [facility]  [phone] °   [facility]  832-7272 °   [facility]  [phone] °   [facility]  832-4777 °   [facility]  [phone] °   [facility]  272-1050 ° °Psychological Services °[facility]  [phone] °[facility]  [phone] °[facility]   [phone] (emergency services 641-4993) ° °Substance Abuse Resources °[facility]  336-882-2125 °[facility] 336-784-9470 °[facility] 336-285-9073 °[facility] 336-845-3988 °Residential & Outpatient Substance Abuse Program  800-659-3381 ° °Abuse/Neglect °[place] [facility] (336) 641-3795 °[place] [facility] Hot[facility] 800-378-5315 (After Hours) ° °Emergency Shelter °[facility] (336) 271-5985 ° °Maternity Homes °[facility] (336) 275-9566 °[facility] (704) 372-4663 ° °MRSA Hotline #:   832-7006 ° ° ° °[place] [facility] ° °[facility]     [employer]                            [facility] °[address] [place]                       [address]      [address]  °[place]                                                [place]                            [place] °Phone:  349-3220                                   Phone:  [phone]                 Phone:  342-8140 ° °[facility] °Phone:  342-8316 ° °[place] [facility] Hotline °(336) 342-1394 °(336) 342-3537 (After Hours) ° ° ° ° °

## 2011-12-24 LAB — URINE CULTURE: Culture  Setup Time: 201304022059

## 2012-01-07 ENCOUNTER — Other Ambulatory Visit: Payer: Self-pay | Admitting: Family Medicine

## 2012-01-07 ENCOUNTER — Other Ambulatory Visit (HOSPITAL_COMMUNITY)
Admission: RE | Admit: 2012-01-07 | Discharge: 2012-01-07 | Disposition: A | Discharge status: Home / Self Care | Payer: 59 | Source: Ambulatory Visit | Attending: Family Medicine | Admitting: Family Medicine

## 2012-01-07 DIAGNOSIS — Z01419 Encounter for gynecological examination (general) (routine) without abnormal findings: Secondary | ICD-10-CM | POA: Insufficient documentation

## 2012-03-22 ENCOUNTER — Encounter (INDEPENDENT_AMBULATORY_CARE_PROVIDER_SITE_OTHER): Payer: Self-pay | Admitting: Surgery

## 2012-03-26 ENCOUNTER — Other Ambulatory Visit: Payer: Self-pay | Admitting: Internal Medicine

## 2012-03-26 MED ORDER — FLUCONAZOLE 100 MG PO TABS: 100.0000 mg | ORAL_TABLET | Freq: Every day | ORAL | Status: AC

## 2012-03-31 ENCOUNTER — Emergency Department (HOSPITAL_COMMUNITY)
Admission: EM | Admit: 2012-03-31 | Discharge: 2012-03-31 | Disposition: A | Discharge status: Home / Self Care | Payer: 59 | Attending: Emergency Medicine | Admitting: Emergency Medicine

## 2012-03-31 ENCOUNTER — Encounter (HOSPITAL_COMMUNITY): Payer: Self-pay | Admitting: Emergency Medicine

## 2012-03-31 DIAGNOSIS — F3289 Other specified depressive episodes: Secondary | ICD-10-CM | POA: Insufficient documentation

## 2012-03-31 DIAGNOSIS — J45909 Unspecified asthma, uncomplicated: Secondary | ICD-10-CM | POA: Insufficient documentation

## 2012-03-31 DIAGNOSIS — Z79899 Other long term (current) drug therapy: Secondary | ICD-10-CM | POA: Insufficient documentation

## 2012-03-31 DIAGNOSIS — E785 Hyperlipidemia, unspecified: Secondary | ICD-10-CM | POA: Insufficient documentation

## 2012-03-31 DIAGNOSIS — F329 Major depressive disorder, single episode, unspecified: Secondary | ICD-10-CM | POA: Insufficient documentation

## 2012-03-31 DIAGNOSIS — K219 Gastro-esophageal reflux disease without esophagitis: Secondary | ICD-10-CM | POA: Insufficient documentation

## 2012-03-31 DIAGNOSIS — E109 Type 1 diabetes mellitus without complications: Secondary | ICD-10-CM | POA: Insufficient documentation

## 2012-03-31 DIAGNOSIS — L732 Hidradenitis suppurativa: Secondary | ICD-10-CM | POA: Insufficient documentation

## 2012-03-31 LAB — BASIC METABOLIC PANEL
CO2: 23 mEq/L (ref 19–32)
Calcium: 9.5 mg/dL (ref 8.4–10.5)
Chloride: 102 mEq/L (ref 96–112)
Glucose, Bld: 155 mg/dL — ABNORMAL HIGH (ref 70–99)
Potassium: 3.5 mEq/L (ref 3.5–5.1)
Sodium: 135 mEq/L (ref 135–145)

## 2012-03-31 LAB — CBC WITH DIFFERENTIAL/PLATELET
Eosinophils Relative: 3 % (ref 0–5)
Hemoglobin: 14.4 g/dL (ref 12.0–15.0)
Lymphocytes Relative: 36 % (ref 12–46)
Lymphs Abs: 2.9 10*3/uL (ref 0.7–4.0)
MCV: 89.1 fL (ref 78.0–100.0)
Monocytes Relative: 7 % (ref 3–12)
Platelets: 349 10*3/uL (ref 150–400)
RBC: 4.7 MIL/uL (ref 3.87–5.11)
WBC: 8.1 10*3/uL (ref 4.0–10.5)

## 2012-03-31 MED ORDER — RIFAMPIN 300 MG PO CAPS: 600.0000 mg | ORAL_CAPSULE | Freq: Every day | ORAL | Status: AC

## 2012-03-31 MED ORDER — CLINDAMYCIN HCL 300 MG PO CAPS: 300.0000 mg | ORAL_CAPSULE | Freq: Two times a day (BID) | ORAL | Status: AC

## 2012-03-31 MED ORDER — TRAMADOL HCL 50 MG PO TABS
50.0000 mg | ORAL_TABLET | Freq: Four times a day (QID) | ORAL | Status: DC
  Filled 2012-03-31: qty 1

## 2012-03-31 MED ORDER — TRAMADOL HCL 50 MG PO TABS
50.0000 mg | ORAL_TABLET | Freq: Four times a day (QID) | ORAL | Status: DC
  Administered 2012-03-31: 50 mg via ORAL

## 2012-03-31 MED ORDER — ONDANSETRON HCL 4 MG PO TABS: 4.0000 mg | ORAL_TABLET | Freq: Three times a day (TID) | ORAL | Status: AC | PRN

## 2012-03-31 NOTE — ED Provider Notes (Signed)
Medical screening examination/treatment/procedure(s) were conducted as a shared visit with non-physician practitioner(s) and myself.  I personally evaluated the patient during the encounter  Doug Sou, MD 03/31/12 1656

## 2012-03-31 NOTE — ED Provider Notes (Signed)
History     CSN: [id number]  Arrival date & time 03/31/12  0847   First MD Initiated Contact with Patient 03/31/12 [phone]      Chief Complaint  Patient presents with  . recurrent abcesses   . Hyperglycemia    (Consider location/radiation/quality/duration/timing/severity/associated sxs/prior treatment) HPI  31 y.o. F with Type 1 Diabetes who presents for recurrent boils - multiple located in right inner thigh and in axillae. They are described as "hard knots" under the skin without pustular component and are very tender. She reports a 14 year history of this problem, managed with antibiotics in the past.  They are resistant to warm compresses, and she has recently tried 3 rounds of anitbiotics with doxycycline, Keflex, and Cipro ordered by her PCP Laurann Montana @ [facility]. Dr. Cliffton Asters is concerned that the patient may need IV antibiotics. The problems has been accompanied by elevate blood sugar, with a CBG 221 this AM. She notes generalized weakness, vomiting x 1, and polydipsia.   Past Medical History  Diagnosis Date  . Diabetes mellitus   . Asthma   . GERD (gastroesophageal reflux disease)   . Hx MRSA infection   . Migraine   . Heart murmur   . Hyperlipidemia   . Vitamin D insufficiency   . Depression     Past Surgical History  Procedure Date  . Eye surgery   . Wisdom tooth extraction   . Tonsillectomy   . Cholecystectomy   . Bunionectomy   . Esophagogastroduodenoscopy 11/17/2011    Procedure: ESOPHAGOGASTRODUODENOSCOPY (EGD);  Surgeon: Shirley Friar, MD;  Location: [facility];  Service: Endoscopy;  Laterality: N/A;  . Breast surgery     rt lumpectomy    Family History  Problem Relation Age of Onset  . Anesthesia problems Neg Hx   . Hypotension Neg Hx   . Malignant hyperthermia Neg Hx   . Pseudochol deficiency Neg Hx     History  Substance Use Topics  . Smoking status: Former Smoker    Types: Cigarettes    Quit date: 09/23/2011  . Smokeless  tobacco: Not on file  . Alcohol Use: No    OB History    Grav Para Term Preterm Abortions TAB SAB Ect Mult Living   4    3  3   1       Review of Systems  Constitutional: Positive for chills and fatigue. Negative for fever and unexpected weight change.  HENT: Negative.   Eyes: Negative.   Respiratory: Negative.   Cardiovascular: Negative.   Gastrointestinal: Negative.   Genitourinary: Negative for dysuria and flank pain.  Musculoskeletal: Negative.   Neurological: Positive for dizziness.  Hematological: Negative.   Psychiatric/Behavioral: Negative.     Allergies  Cefaclor; Latex; Levofloxacin; Moxifloxacin; Promethazine hcl; Propoxyphene-acetaminophen; Rosiglitazone maleate; Sulfadiazine; Keflex; Oxycodone-acetaminophen; and Penicillins  Home Medications   Current Outpatient Rx  Name Route Sig Dispense Refill  . ONETOUCH ULTRA SYSTEM W/DEVICE KIT Does not apply 1 kit by Does not apply route once.    Marland Kitchen FLUCONAZOLE 100 MG PO TABS Oral Take 1 tablet (100 mg total) by mouth daily. 10 tablet 0  . NORGESTIM-ETH ESTRAD TRIPHASIC 0.18/0.215/0.25 MG-35 MCG PO TABS Oral Take 1 tablet by mouth daily.    Marland Kitchen VILAZODONE HCL 40 MG PO TABS Oral Take 40 mg by mouth daily.    Marland Kitchen GLUCAGON HCL (RDNA) 1 MG IJ SOLR Intravenous Inject 1 mg into the vein once as needed.    . INSULIN GLARGINE  100 UNIT/ML [place] SOLN Subcutaneous Inject into the skin at bedtime.    . INSULIN LISPRO (HUMAN) 100 UNIT/ML [place] SOLN Subcutaneous Inject 75-100 Units into the skin continuous. Pt uses an insulin pump      BP 124/73  Pulse 84  Temp 98.4 F (36.9 C) (Oral)  Resp 22  Ht 5\' 5"  (1.651 m)  Wt 240 lb (108.863 kg)  BMI 39.94 kg/m2  SpO2 100%  LMP 03/12/2012  Physical Exam  Constitutional: She appears well-developed and well-nourished. She appears distressed.       Morbidly obese WF, very pleasant  HENT:  Head: Normocephalic and atraumatic.  Mouth/Throat: No oropharyngeal exudate.  Eyes: Conjunctivae and EOM  are normal. Pupils are equal, round, and reactive to light.  Neck: Normal range of motion. Neck supple.  Cardiovascular: Normal rate, regular rhythm, normal heart sounds and intact distal pulses.   Pulmonary/Chest: Effort normal and breath sounds normal.  Abdominal: Soft. Bowel sounds are normal.  Musculoskeletal: Normal range of motion.  Skin:          Multiple tender subcutaneous nodules and scaring in her axillae bilaterally; No drainage or pustules   Psychiatric: She has a normal mood and affect. Her behavior is normal. Thought content normal.    ED Course  Procedures (including critical care time)  Labs Reviewed  BASIC METABOLIC PANEL - Abnormal; Notable for the following:    Glucose, Bld 155 (*)     All other components within normal limits  CBC WITH DIFFERENTIAL   No results found.   No diagnosis found.    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 The patient is not in DKA and has no evidence of systemic infection. Therefore, there is no reason admission. She will be discharged with a dual therapy regimen of clindamycin and rifampin. This will be for 3 months, and the patient will follow-up with her PCP and general surgeon. The plan has been discussed with the patient's PCP who is in agreement.         Garnetta Buddy, MD 03/31/12 [phone]

## 2012-03-31 NOTE — ED Notes (Signed)
Hx IDDM- has insulin pump-- has had recurrent abscesses in axillary area, inner thighs. Has had three rounds of PO antibiotics, private dr told to come to ED.

## 2012-03-31 NOTE — ED Provider Notes (Signed)
Complains of painful areas over bilateral proximal thighs and bilateral axillary areas for several weeks. Treated with Keflex Cipro and doxycycline. Vomited one time yesterday. No fever. Reports her last blood sugar was 221 this morning has had blood sugars in the 305 100 range.  Doug Sou, MD 03/31/12 1020

## 2012-03-31 NOTE — ED Notes (Addendum)
resident at bedside.

## 2012-03-31 NOTE — ED Notes (Signed)
Ice applied to abscesses, per pt request

## 2012-04-06 ENCOUNTER — Other Ambulatory Visit: Payer: Self-pay | Admitting: Internal Medicine

## 2012-04-06 DIAGNOSIS — J309 Allergic rhinitis, unspecified: Secondary | ICD-10-CM

## 2012-04-21 ENCOUNTER — Encounter (INDEPENDENT_AMBULATORY_CARE_PROVIDER_SITE_OTHER): Payer: Self-pay | Admitting: Surgery

## 2012-04-21 ENCOUNTER — Ambulatory Visit (INDEPENDENT_AMBULATORY_CARE_PROVIDER_SITE_OTHER): Payer: Commercial Managed Care - PPO | Admitting: Surgery

## 2012-04-21 VITALS — BP 118/64 | HR 82 | Temp 98.6°F | Resp 18 | Ht 64.0 in | Wt 240.0 lb

## 2012-04-21 DIAGNOSIS — L732 Hidradenitis suppurativa: Secondary | ICD-10-CM

## 2012-04-21 NOTE — Progress Notes (Signed)
Patient ID: [name], female   DOB: 07-Jan-1981, 31 y.o.   MRN: [id number]  Chief Complaint  Patient presents with  . Other    hidradenitis    HPI [name] is a 31 y.o. female.   HPI This is a very pleasant female referred by Dr. Laurann Montana for evaluation of hidradenitis. She is been having attacks of chronic abscesses in her axilla and groins for at least 14 years. She has had multiple incision and drainages and has been on antibiotics multiple times. Currently she reports that the groin and thighs are the worse as far as discomfort. She is otherwise without complaints. She denies fevers or chills Past Medical History  Diagnosis Date  . Diabetes mellitus   . Asthma   . GERD (gastroesophageal reflux disease)   . Hx MRSA infection   . Migraine   . Heart murmur   . Hyperlipidemia   . Vitamin D insufficiency   . Depression     Past Surgical History  Procedure Date  . Eye surgery   . Wisdom tooth extraction   . Tonsillectomy   . Cholecystectomy   . Bunionectomy   . Esophagogastroduodenoscopy 11/17/2011    Procedure: ESOPHAGOGASTRODUODENOSCOPY (EGD);  Surgeon: Shirley Friar, MD;  Location: [facility];  Service: Endoscopy;  Laterality: N/A;  . Breast surgery     rt lumpectomy    Family History  Problem Relation Age of Onset  . Anesthesia problems Neg Hx   . Hypotension Neg Hx   . Malignant hyperthermia Neg Hx   . Pseudochol deficiency Neg Hx     Social History History  Substance Use Topics  . Smoking status: Former Smoker    Types: Cigarettes    Quit date: 09/23/2011  . Smokeless tobacco: Not on file  . Alcohol Use: No    Allergies  Allergen Reactions  . Cefaclor   . Latex     REACTION: RASH  . Levofloxacin   . Moxifloxacin     REACTION: Intense rash  . Promethazine Hcl Other (See Comments)    Feels weird   . Propoxyphene-Acetaminophen   . Rosiglitazone Maleate Swelling  . Sulfadiazine   . Keflex (Cephalexin) Diarrhea and Rash  .  Oxycodone-Acetaminophen Swelling and Rash  . Penicillins Rash    Current Outpatient Prescriptions  Medication Sig Dispense Refill  . Blood Glucose Monitoring Suppl (ONE TOUCH ULTRA SYSTEM KIT) W/DEVICE KIT 1 kit by Does not apply route once.      Marland Kitchen glucagon (GLUCAGEN) 1 MG SOLR Inject 1 mg into the vein once as needed.      . insulin glargine (LANTUS) 100 UNIT/ML injection Inject into the skin at bedtime.      . insulin lispro (HUMALOG) 100 UNIT/ML injection Inject 75-100 Units into the skin continuous. Pt uses an insulin pump      . Norgestimate-Ethinyl Estradiol Triphasic (TRINESSA, 28,) 0.18/0.215/0.25 MG-35 MCG tablet Take 1 tablet by mouth daily.      . Vilazodone HCl (VIIBRYD) 40 MG TABS Take 40 mg by mouth daily.       Current Facility-Administered Medications  Medication Dose Route Frequency Provider Last Rate Last Dose  . TDaP (BOOSTRIX) injection 0.5 mL  0.5 mL Intramuscular Once Michele Mcalpine, MD        Review of Systems Review of Systems  Constitutional: Negative for fever, chills and unexpected weight change.  HENT: Negative for hearing loss, congestion, sore throat, trouble swallowing and voice change.   Eyes: Negative  for visual disturbance.  Respiratory: Negative for cough and wheezing.   Cardiovascular: Negative for chest pain, palpitations and leg swelling.  Gastrointestinal: Negative for nausea, vomiting, abdominal pain, diarrhea, constipation, blood in stool, abdominal distention and anal bleeding.  Genitourinary: Negative for hematuria, vaginal bleeding and difficulty urinating.  Musculoskeletal: Negative for arthralgias.  Skin: Negative for rash and wound.  Neurological: Negative for seizures, syncope and headaches.  Hematological: Negative for adenopathy. Does not bruise/bleed easily.  Psychiatric/Behavioral: Negative for confusion.    Blood pressure 118/64, pulse 82, temperature 98.6 F (37 C), temperature source Temporal, resp. rate 18, height 5\' 4"  (1.626  m), weight 240 lb (108.863 kg), last menstrual period 03/12/2012.  Physical Exam Physical Exam  Constitutional: She is oriented to person, place, and time. She appears well-developed and well-nourished. No distress.  HENT:  Head: Normocephalic and atraumatic.  Right Ear: External ear normal.  Left Ear: External ear normal.  Cardiovascular: Normal rate, regular rhythm and normal heart sounds.   No murmur heard. Pulmonary/Chest: Effort normal and breath sounds normal.  Abdominal: Soft.  Neurological: She is alert and oriented to person, place, and time.  Skin: Skin is warm and dry. No rash noted. She is not diaphoretic. No erythema.       She has changes of chronic hidradenitis in both her  Axilla, bilateral proximal medial thighs, and left groin    Data Reviewed   Assessment    Chronic hidradenitis    Plan    I had a long about hidradenitis with her and she is well aware of the diagnosis and the chronic nature.     At this point, her thighs and groin are most sympathetic areas. I will schedule her for wide excision of both her medial thighs and left groin hidradenitis. I discussed the risks of surgery which includes does not limited to bleeding, infection, having a chronic open wound, further hidradenitis, et Karie Soda. She understands and wishes to proceed. Surgery will be scheduled   [name] A 04/21/2012, 3:13 PM

## 2012-04-22 DIAGNOSIS — L732 Hidradenitis suppurativa: Secondary | ICD-10-CM

## 2012-04-22 HISTORY — DX: Hidradenitis suppurativa: L73.2

## 2012-04-23 ENCOUNTER — Encounter (HOSPITAL_BASED_OUTPATIENT_CLINIC_OR_DEPARTMENT_OTHER): Payer: Self-pay | Admitting: *Deleted

## 2012-04-23 NOTE — Pre-Procedure Instructions (Signed)
Pt. will contact Dr. Sharl Ma re: insulin pump rate and NPO status night before surgery

## 2012-04-26 ENCOUNTER — Other Ambulatory Visit (INDEPENDENT_AMBULATORY_CARE_PROVIDER_SITE_OTHER): Payer: 59

## 2012-04-26 ENCOUNTER — Other Ambulatory Visit: Payer: Self-pay | Admitting: Internal Medicine

## 2012-04-26 DIAGNOSIS — Z01818 Encounter for other preprocedural examination: Secondary | ICD-10-CM

## 2012-04-26 LAB — BASIC METABOLIC PANEL
BUN: 14 mg/dL (ref 6–23)
Chloride: 102 mEq/L (ref 96–112)
Glucose, Bld: 304 mg/dL — ABNORMAL HIGH (ref 70–99)
Potassium: 4.1 mEq/L (ref 3.5–5.1)
Sodium: 134 mEq/L — ABNORMAL LOW (ref 135–145)

## 2012-04-27 NOTE — Pre-Procedure Instructions (Signed)
Pt. called back - states blood sugar of 304 is not typical, and level did come back down later.  Will check blood sugar on arrival to [facility]

## 2012-04-27 NOTE — Pre-Procedure Instructions (Signed)
Message left for pt. to notify of blood sugar level 304 (lab results from 04/26/2012); left message for her to call us.

## 2012-04-30 ENCOUNTER — Encounter (HOSPITAL_BASED_OUTPATIENT_CLINIC_OR_DEPARTMENT_OTHER): Payer: Self-pay | Admitting: Anesthesiology

## 2012-04-30 ENCOUNTER — Ambulatory Visit (HOSPITAL_BASED_OUTPATIENT_CLINIC_OR_DEPARTMENT_OTHER): Payer: 59 | Admitting: Anesthesiology

## 2012-04-30 ENCOUNTER — Encounter (HOSPITAL_BASED_OUTPATIENT_CLINIC_OR_DEPARTMENT_OTHER): Payer: Self-pay | Admitting: *Deleted

## 2012-04-30 ENCOUNTER — Encounter (HOSPITAL_BASED_OUTPATIENT_CLINIC_OR_DEPARTMENT_OTHER)
Admission: RE | Disposition: A | Discharge status: Home / Self Care | Payer: Self-pay | Source: Ambulatory Visit | Attending: Surgery

## 2012-04-30 ENCOUNTER — Ambulatory Visit (HOSPITAL_BASED_OUTPATIENT_CLINIC_OR_DEPARTMENT_OTHER)
Admission: RE | Admit: 2012-04-30 | Discharge: 2012-04-30 | Disposition: A | Discharge status: Home / Self Care | Payer: 59 | Source: Ambulatory Visit | Attending: Surgery | Admitting: Surgery

## 2012-04-30 DIAGNOSIS — L732 Hidradenitis suppurativa: Secondary | ICD-10-CM

## 2012-04-30 DIAGNOSIS — J45909 Unspecified asthma, uncomplicated: Secondary | ICD-10-CM | POA: Insufficient documentation

## 2012-04-30 DIAGNOSIS — Z794 Long term (current) use of insulin: Secondary | ICD-10-CM | POA: Insufficient documentation

## 2012-04-30 DIAGNOSIS — R51 Headache: Secondary | ICD-10-CM | POA: Insufficient documentation

## 2012-04-30 DIAGNOSIS — I498 Other specified cardiac arrhythmias: Secondary | ICD-10-CM | POA: Insufficient documentation

## 2012-04-30 DIAGNOSIS — K219 Gastro-esophageal reflux disease without esophagitis: Secondary | ICD-10-CM | POA: Insufficient documentation

## 2012-04-30 DIAGNOSIS — F3289 Other specified depressive episodes: Secondary | ICD-10-CM | POA: Insufficient documentation

## 2012-04-30 DIAGNOSIS — F329 Major depressive disorder, single episode, unspecified: Secondary | ICD-10-CM | POA: Insufficient documentation

## 2012-04-30 DIAGNOSIS — E119 Type 2 diabetes mellitus without complications: Secondary | ICD-10-CM | POA: Insufficient documentation

## 2012-04-30 DIAGNOSIS — F411 Generalized anxiety disorder: Secondary | ICD-10-CM | POA: Insufficient documentation

## 2012-04-30 HISTORY — DX: Hidradenitis suppurativa: L73.2

## 2012-04-30 HISTORY — PX: HYDRADENITIS EXCISION: SHX5243

## 2012-04-30 LAB — POCT HEMOGLOBIN-HEMACUE: Hemoglobin: 15.2 g/dL — ABNORMAL HIGH (ref 12.0–15.0)

## 2012-04-30 LAB — GLUCOSE, CAPILLARY: Glucose-Capillary: 155 mg/dL — ABNORMAL HIGH (ref 70–99)

## 2012-04-30 SURGERY — EXCISION, HIDRADENITIS, INGUINAL REGION
Anesthesia: General | Site: Thigh | Laterality: Left | Wound class: Contaminated

## 2012-04-30 MED ORDER — FENTANYL CITRATE 0.05 MG/ML IJ SOLN
INTRAMUSCULAR | Status: DC | PRN
  Administered 2012-04-30: 25 ug via INTRAVENOUS
  Administered 2012-04-30: 50 ug via INTRAVENOUS
  Administered 2012-04-30: 25 ug via INTRAVENOUS

## 2012-04-30 MED ORDER — ACETAMINOPHEN 325 MG PO TABS: 650.0000 mg | ORAL_TABLET | ORAL | Status: DC | PRN

## 2012-04-30 MED ORDER — LIDOCAINE HCL (CARDIAC) 20 MG/ML IV SOLN
INTRAVENOUS | Status: DC | PRN
  Administered 2012-04-30: 50 mg via INTRAVENOUS

## 2012-04-30 MED ORDER — OXYCODONE HCL 5 MG PO TABS: 5.0000 mg | ORAL_TABLET | Freq: Once | ORAL | Status: DC | PRN

## 2012-04-30 MED ORDER — OXYCODONE HCL 5 MG/5ML PO SOLN: 5.0000 mg | Freq: Once | ORAL | Status: DC | PRN

## 2012-04-30 MED ORDER — ACETAMINOPHEN 650 MG RE SUPP: 650.0000 mg | RECTAL | Status: DC | PRN

## 2012-04-30 MED ORDER — MORPHINE SULFATE 4 MG/ML IJ SOLN: 4.0000 mg | INTRAMUSCULAR | Status: DC | PRN

## 2012-04-30 MED ORDER — SODIUM CHLORIDE 0.9 % IJ SOLN: 3.0000 mL | INTRAMUSCULAR | Status: DC | PRN

## 2012-04-30 MED ORDER — SODIUM CHLORIDE 0.9 % IV SOLN: 250.0000 mL | INTRAVENOUS | Status: DC | PRN

## 2012-04-30 MED ORDER — PROPOFOL 10 MG/ML IV EMUL
INTRAVENOUS | Status: DC | PRN
  Administered 2012-04-30: 200 mg via INTRAVENOUS

## 2012-04-30 MED ORDER — HYDROMORPHONE HCL PF 1 MG/ML IJ SOLN
0.2500 mg | INTRAMUSCULAR | Status: DC | PRN
  Administered 2012-04-30 (×2): 0.5 mg via INTRAVENOUS

## 2012-04-30 MED ORDER — HYDROCODONE-ACETAMINOPHEN 7.5-500 MG PO TABS: 1.0000 | ORAL_TABLET | ORAL | Status: AC | PRN

## 2012-04-30 MED ORDER — DEXAMETHASONE SODIUM PHOSPHATE 4 MG/ML IJ SOLN
INTRAMUSCULAR | Status: DC | PRN
  Administered 2012-04-30: 4 mg via INTRAVENOUS

## 2012-04-30 MED ORDER — OXYCODONE HCL 5 MG PO TABS: 5.0000 mg | ORAL_TABLET | ORAL | Status: DC | PRN

## 2012-04-30 MED ORDER — MIDAZOLAM HCL 5 MG/5ML IJ SOLN
INTRAMUSCULAR | Status: DC | PRN
  Administered 2012-04-30: 2 mg via INTRAVENOUS

## 2012-04-30 MED ORDER — LACTATED RINGERS IV SOLN
INTRAVENOUS | Status: DC
  Administered 2012-04-30: 10:00:00 via INTRAVENOUS

## 2012-04-30 MED ORDER — ACETAMINOPHEN 10 MG/ML IV SOLN
1000.0000 mg | Freq: Once | INTRAVENOUS | Status: AC
  Administered 2012-04-30: 1000 mg via INTRAVENOUS

## 2012-04-30 MED ORDER — DOXYCYCLINE HYCLATE 100 MG PO TABS: 100.0000 mg | ORAL_TABLET | Freq: Two times a day (BID) | ORAL | Status: AC

## 2012-04-30 MED ORDER — METOCLOPRAMIDE HCL 5 MG/ML IJ SOLN
INTRAMUSCULAR | Status: DC | PRN
  Administered 2012-04-30: 10 mg via INTRAVENOUS

## 2012-04-30 MED ORDER — KETOROLAC TROMETHAMINE 30 MG/ML IJ SOLN
INTRAMUSCULAR | Status: DC | PRN
  Administered 2012-04-30: 30 mg via INTRAVENOUS

## 2012-04-30 MED ORDER — VANCOMYCIN HCL IN DEXTROSE 1-5 GM/200ML-% IV SOLN: 1000.0000 mg | INTRAVENOUS | Status: DC

## 2012-04-30 MED ORDER — SCOPOLAMINE 1 MG/3DAYS TD PT72
1.0000 | MEDICATED_PATCH | Freq: Once | TRANSDERMAL | Status: DC
  Administered 2012-04-30: 1.5 mg via TRANSDERMAL

## 2012-04-30 MED ORDER — VANCOMYCIN HCL 1000 MG IV SOLR
1500.0000 mg | INTRAVENOUS | Status: AC
  Administered 2012-04-30: 1500 mg via INTRAVENOUS

## 2012-04-30 MED ORDER — METOCLOPRAMIDE HCL 5 MG/ML IJ SOLN: 10.0000 mg | Freq: Once | INTRAMUSCULAR | Status: DC | PRN

## 2012-04-30 MED ORDER — BUPIVACAINE-EPINEPHRINE 0.5% -1:200000 IJ SOLN
INTRAMUSCULAR | Status: DC | PRN
  Administered 2012-04-30: 30 mL

## 2012-04-30 MED ORDER — SODIUM CHLORIDE 0.9 % IJ SOLN: 3.0000 mL | Freq: Two times a day (BID) | INTRAMUSCULAR | Status: DC

## 2012-04-30 MED ORDER — ONDANSETRON HCL 4 MG/2ML IJ SOLN
INTRAMUSCULAR | Status: DC | PRN
  Administered 2012-04-30: 4 mg via INTRAVENOUS

## 2012-04-30 MED ORDER — ONDANSETRON HCL 4 MG/2ML IJ SOLN: 4.0000 mg | Freq: Four times a day (QID) | INTRAMUSCULAR | Status: DC | PRN

## 2012-04-30 SURGICAL SUPPLY — 37 items
BLADE HEX COATED 2.75 (ELECTRODE) ×2 IMPLANT
BLADE SURG 15 STRL LF DISP TIS (BLADE) ×1 IMPLANT
BLADE SURG 15 STRL SS (BLADE) ×2
BLADE SURG ROTATE 9660 (MISCELLANEOUS) ×1 IMPLANT
CANISTER SUCTION 1200CC (MISCELLANEOUS) ×2 IMPLANT
CHLORAPREP W/TINT 26ML (MISCELLANEOUS) ×1 IMPLANT
CLOTH BEACON ORANGE TIMEOUT ST (SAFETY) ×2 IMPLANT
COVER MAYO STAND STRL (DRAPES) ×2 IMPLANT
COVER TABLE BACK 60X90 (DRAPES) ×2 IMPLANT
DECANTER SPIKE VIAL GLASS SM (MISCELLANEOUS) IMPLANT
DRAPE LAPAROTOMY T 102X78X121 (DRAPES) IMPLANT
DRAPE PED LAPAROTOMY (DRAPES) ×2 IMPLANT
DRAPE UTILITY XL STRL (DRAPES) ×2 IMPLANT
ELECT REM PT RETURN 9FT ADLT (ELECTROSURGICAL) ×2
ELECTRODE REM PT RTRN 9FT ADLT (ELECTROSURGICAL) ×1 IMPLANT
GAUZE SPONGE 4X4 12PLY STRL LF (GAUZE/BANDAGES/DRESSINGS) ×2 IMPLANT
GLOVE SURG SIGNA 7.5 PF LTX (GLOVE) ×1 IMPLANT
GOWN PREVENTION PLUS XLARGE (GOWN DISPOSABLE) ×2 IMPLANT
GOWN PREVENTION PLUS XXLARGE (GOWN DISPOSABLE) ×2 IMPLANT
NDL HYPO 25X1 1.5 SAFETY (NEEDLE) ×1 IMPLANT
NEEDLE HYPO 25X1 1.5 SAFETY (NEEDLE) ×2 IMPLANT
NS IRRIG 1000ML POUR BTL (IV SOLUTION) ×2 IMPLANT
PACK BASIN DAY SURGERY FS (CUSTOM PROCEDURE TRAY) ×2 IMPLANT
PENCIL BUTTON HOLSTER BLD 10FT (ELECTRODE) ×2 IMPLANT
SLEEVE SCD COMPRESS KNEE MED (MISCELLANEOUS) IMPLANT
SPONGE GAUZE 4X4 12PLY (GAUZE/BANDAGES/DRESSINGS) ×1 IMPLANT
SPONGE LAP 4X18 X RAY DECT (DISPOSABLE) ×1 IMPLANT
SUT ETHILON 2 0 FS 18 (SUTURE) ×8 IMPLANT
SUT ETHILON 3 0 FSL (SUTURE) IMPLANT
SYR BULB 3OZ (MISCELLANEOUS) ×2 IMPLANT
SYR CONTROL 10ML LL (SYRINGE) ×2 IMPLANT
TAPE CLOTH SURG 4X10 WHT LF (GAUZE/BANDAGES/DRESSINGS) ×1 IMPLANT
TOWEL OR 17X24 6PK STRL BLUE (TOWEL DISPOSABLE) ×2 IMPLANT
TOWEL OR NON WOVEN STRL DISP B (DISPOSABLE) ×2 IMPLANT
TUBE CONNECTING 20X1/4 (TUBING) ×2 IMPLANT
WATER STERILE IRR 1000ML POUR (IV SOLUTION) ×2 IMPLANT
YANKAUER SUCT BULB TIP NO VENT (SUCTIONS) ×2 IMPLANT

## 2012-04-30 NOTE — Anesthesia Procedure Notes (Signed)
Procedure Name: LMA Insertion Date/Time: 04/30/2012 10:14 AM Performed by: Gar Gibbon Pre-anesthesia Checklist: Patient identified, Emergency Drugs available, Suction available and Patient being monitored Patient Re-evaluated:Patient Re-evaluated prior to inductionOxygen Delivery Method: Circle System Utilized Preoxygenation: Pre-oxygenation with 100% oxygen Intubation Type: IV induction Ventilation: Mask ventilation without difficulty LMA: LMA with gastric port inserted LMA Size: 4.0 Number of attempts: 1 Placement Confirmation: positive ETCO2 Tube secured with: Tape Dental Injury: Teeth and Oropharynx as per pre-operative assessment

## 2012-04-30 NOTE — Anesthesia Postprocedure Evaluation (Signed)
Anesthesia Post Note  Patient: [name]  Procedure(s) Performed: Procedure(s) (LRB): EXCISION HYDRADENITIS GROIN (Left)  Anesthesia type: General  Patient location: PACU  Post pain: Pain level controlled  Post assessment: Patient's Cardiovascular Status Stable  Last Vitals:  Filed Vitals:   04/30/12 1145  BP: 97/61  Pulse: 70  Temp:   Resp: 10    Post vital signs: Reviewed and stable  Level of consciousness: alert  Complications: No apparent anesthesia complications

## 2012-04-30 NOTE — Op Note (Signed)
EXCISION HYDRADENITIS GROIN  Procedure Note  STACIE TEMPLIN 04/30/2012   Pre-op Diagnosis: hidradenitis bilateral thighs,left groin     Post-op Diagnosis: same  Procedure(s): WIDE EXCISION HYDRADENITIS RIGHT THIGH (10CM), LEFT THIGH (10CM), AND LEFT GROIN (5 CM)  Surgeon(s): Shelly Rubenstein, MD  Anesthesia: General  Staff:  Lorenza Evangelist, RN - Scrub Person Chrystine Oiler, RN - Circulator Joylene Grapes, RN - Circulator Assistant  Estimated Blood Loss: Minimal               Specimens: all tissue sent to path         Procedure: The patient was brought to the operating room and identified as the correct patient. She was placed supine on the operating room table and general anesthesia was induced. She was placed in the frog-leg position. Her proximal thighs and groins were then prepped and draped in the usual sterile fashion. I performed 12 cm wide excision of multiple chronic areas in the right proximal thigh, a 10 cm wide excision of multiple chronic areas in the left proximal thigh, and a 5 cm wide excision of several chronic areas in the left groin. I took all incisions down into the subcutaneous tissue with electrocautery. All areas were then completely removed and sent to pathology for evaluation. Hemostasis was achieved in all areas with cautery. I anesthetized all wounds with half percent Marcaine with epinephrine. I then closed all incisions with interrupted 2-0 nylon sutures. Gauze and tape were applied. The patient tolerated the procedure well. All counts were correct at the end of the procedure. The patient was then extubated in the operating room and taken in a stable condition to the recovery room. Riyana Biel A   Date: 04/30/2012  Time: 11:03 AM

## 2012-04-30 NOTE — Anesthesia Preprocedure Evaluation (Signed)
Anesthesia Evaluation  Patient identified by MRN, date of birth, ID band Patient awake    Reviewed: Allergy & Precautions, H&P , NPO status , Patient's Chart, lab work & pertinent test results, reviewed documented beta blocker date and time   Airway Mallampati: II TM Distance: >3 FB Neck ROM: full    Dental   Pulmonary shortness of breath and with exertion, asthma ,  breath sounds clear to auscultation        Cardiovascular + dysrhythmias Supra Ventricular Tachycardia Rhythm:regular     Neuro/Psych  Headaches, PSYCHIATRIC DISORDERS Anxiety Depression    GI/Hepatic Neg liver ROS, GERD-  Medicated and Controlled,  Endo/Other  Type 1, Insulin DependentMorbid obesity  Renal/GU negative Renal ROS  negative genitourinary   Musculoskeletal   Abdominal   Peds  Hematology negative hematology ROS (+)   Anesthesia Other Findings See surgeon's H&P   Reproductive/Obstetrics negative OB ROS                           Anesthesia Physical Anesthesia Plan  ASA: III  Anesthesia Plan: General   Post-op Pain Management:    Induction: Intravenous  Airway Management Planned: LMA  Additional Equipment:   Intra-op Plan:   Post-operative Plan: Extubation in OR  Informed Consent: I have reviewed the patients History and Physical, chart, labs and discussed the procedure including the risks, benefits and alternatives for the proposed anesthesia with the patient or authorized representative who has indicated his/her understanding and acceptance.   Dental Advisory Given  Plan Discussed with: CRNA and Surgeon  Anesthesia Plan Comments:         Anesthesia Quick Evaluation

## 2012-04-30 NOTE — Interval H&P Note (Signed)
History and Physical Interval Note: no change in H and P  04/30/2012 9:39 AM  [name]  has presented today for surgery, with the diagnosis of hidradenitis bilateral thighs,left groin  The various methods of treatment have been discussed with the patient and family. After consideration of risks, benefits and other options for treatment, the patient has consented to  Procedure(s) (LRB): EXCISION HYDRADENITIS GROIN (Left) and BILATERAL THIGHS as a surgical intervention .  The patient's history has been reviewed, patient examined, no change in status, stable for surgery.  I have reviewed the patient's chart and labs.  Questions were answered to the patient's satisfaction.     [name] A

## 2012-04-30 NOTE — H&P (Signed)
Patient ID: [name], female DOB: 1981/07/13, 31 y.o. MRN: [id number]  Chief Complaint   Patient presents with   .  Other     hidradenitis    HPI  [name] is a 31 y.o. female.  HPI  This is a very pleasant female referred by Dr. Laurann Montana for evaluation of hidradenitis. She is been having attacks of chronic abscesses in her axilla and groins for at least 14 years. She has had multiple incision and drainages and has been on antibiotics multiple times. Currently she reports that the groin and thighs are the worse as far as discomfort. She is otherwise without complaints. She denies fevers or chills  Past Medical History   Diagnosis  Date   .  Diabetes mellitus    .  Asthma    .  GERD (gastroesophageal reflux disease)    .  Hx MRSA infection    .  Migraine    .  Heart murmur    .  Hyperlipidemia    .  Vitamin D insufficiency    .  Depression     Past Surgical History   Procedure  Date   .  Eye surgery    .  Wisdom tooth extraction    .  Tonsillectomy    .  Cholecystectomy    .  Bunionectomy    .  Esophagogastroduodenoscopy  11/17/2011     Procedure: ESOPHAGOGASTRODUODENOSCOPY (EGD); Surgeon: Shirley Friar, MD; Location: [facility]; Service: Endoscopy; Laterality: N/A;   .  Breast surgery      rt lumpectomy    Family History   Problem  Relation  Age of Onset   .  Anesthesia problems  Neg Hx    .  Hypotension  Neg Hx    .  Malignant hyperthermia  Neg Hx    .  Pseudochol deficiency  Neg Hx     Social History  History   Substance Use Topics   .  Smoking status:  Former Smoker     Types:  Cigarettes     Quit date:  09/23/2011   .  Smokeless tobacco:  Not on file   .  Alcohol Use:  No    Allergies   Allergen  Reactions   .  Cefaclor    .  Latex      REACTION: RASH   .  Levofloxacin    .  Moxifloxacin      REACTION: Intense rash   .  Promethazine Hcl  Other (See Comments)     Feels weird   .  Propoxyphene-Acetaminophen    .  Rosiglitazone  Maleate  Swelling   .  Sulfadiazine    .  Keflex (Cephalexin)  Diarrhea and Rash   .  Oxycodone-Acetaminophen  Swelling and Rash   .  Penicillins  Rash    Current Outpatient Prescriptions   Medication  Sig  Dispense  Refill   .  Blood Glucose Monitoring Suppl (ONE TOUCH ULTRA SYSTEM KIT) W/DEVICE KIT  1 kit by Does not apply route once.     Marland Kitchen  glucagon (GLUCAGEN) 1 MG SOLR  Inject 1 mg into the vein once as needed.     .  insulin glargine (LANTUS) 100 UNIT/ML injection  Inject into the skin at bedtime.     .  insulin lispro (HUMALOG) 100 UNIT/ML injection  Inject 75-100 Units into the skin continuous. Pt uses an insulin pump     .  Norgestimate-Ethinyl  Estradiol Triphasic (TRINESSA, 28,) 0.18/0.215/0.25 MG-35 MCG tablet  Take 1 tablet by mouth daily.     .  Vilazodone HCl (VIIBRYD) 40 MG TABS  Take 40 mg by mouth daily.      Current Facility-Administered Medications   Medication  Dose  Route  Frequency  Provider  Last Rate  Last Dose   .  TDaP (BOOSTRIX) injection 0.5 mL  0.5 mL  Intramuscular  Once  Michele Mcalpine, MD      Review of Systems  Review of Systems  Constitutional: Negative for fever, chills and unexpected weight change.  HENT: Negative for hearing loss, congestion, sore throat, trouble swallowing and voice change.  Eyes: Negative for visual disturbance.  Respiratory: Negative for cough and wheezing.  Cardiovascular: Negative for chest pain, palpitations and leg swelling.  Gastrointestinal: Negative for nausea, vomiting, abdominal pain, diarrhea, constipation, blood in stool, abdominal distention and anal bleeding.  Genitourinary: Negative for hematuria, vaginal bleeding and difficulty urinating.  Musculoskeletal: Negative for arthralgias.  Skin: Negative for rash and wound.  Neurological: Negative for seizures, syncope and headaches.  Hematological: Negative for adenopathy. Does not bruise/bleed easily.  Psychiatric/Behavioral: Negative for confusion.   Blood pressure  118/64, pulse 82, temperature 98.6 F (37 C), temperature source Temporal, resp. rate 18, height 5\' 4"  (1.626 m), weight 240 lb (108.863 kg), last menstrual period 03/12/2012.  Physical Exam  Physical Exam  Constitutional: She is oriented to person, place, and time. She appears well-developed and well-nourished. No distress.  HENT:  Head: Normocephalic and atraumatic.  Right Ear: External ear normal.  Left Ear: External ear normal.  Cardiovascular: Normal rate, regular rhythm and normal heart sounds.  No murmur heard.  Pulmonary/Chest: Effort normal and breath sounds normal.  Abdominal: Soft.  Neurological: She is alert and oriented to person, place, and time.  Skin: Skin is warm and dry. No rash noted. She is not diaphoretic. No erythema.  She has changes of chronic hidradenitis in both her Axilla, bilateral proximal medial thighs, and left groin   Data Reviewed  Assessment   Chronic hidradenitis   Plan   I had a long about hidradenitis with her and she is well aware of the diagnosis and the chronic nature.   At this point, her thighs and groin are most sympathetic areas. I will schedule her for wide excision of both her medial thighs and left groin hidradenitis. I discussed the risks of surgery which includes does not limited to bleeding, infection, having a chronic open wound, further hidradenitis, et Karie Soda. She understands and wishes to proceed. Surgery will be scheduled  Glenden Rossell A

## 2012-04-30 NOTE — Transfer of Care (Signed)
Immediate Anesthesia Transfer of Care Note  Patient: [name]  Procedure(s) Performed: Procedure(s) (LRB): EXCISION HYDRADENITIS GROIN (Left)  Patient Location: PACU  Anesthesia Type: General  Level of Consciousness: awake  Airway & Oxygen Therapy: Patient Spontanous Breathing and Patient connected to face mask oxygen  Post-op Assessment: Report given to PACU RN, Post -op Vital signs reviewed and stable and Patient moving all extremities  Post vital signs: Reviewed and stable  Complications: No apparent anesthesia complications

## 2012-05-03 ENCOUNTER — Encounter (HOSPITAL_BASED_OUTPATIENT_CLINIC_OR_DEPARTMENT_OTHER): Payer: Self-pay | Admitting: Surgery

## 2012-05-04 ENCOUNTER — Telehealth (INDEPENDENT_AMBULATORY_CARE_PROVIDER_SITE_OTHER): Payer: Self-pay | Admitting: General Surgery

## 2012-05-04 NOTE — Telephone Encounter (Signed)
Called in Vicodin 10/325 1-2 po q 4-6 hr prn for pain . Called into [facility]. [phone]

## 2012-05-04 NOTE — Telephone Encounter (Signed)
Patient calling status post hidradenitis surgery on 04/30/2012. She is back to work already, trying to mostly sit down. She is having a lot of pain. She is taking Ibuprofen at work, but pain even breaking through Vicodin when she takes it. She states she had a small amount of bleeding at incision site yesterday but none since and it looks like it is healing good now. She is also having some swelling. I advised if she is working this may be promoting the increased pain and swelling. Please advise. You can reach patient at work [phone] or on her cell phone [phone]. She also isn't scheduled for follow up until 05/17/2012 and she wants to make sure this is okay since she has sutures.

## 2012-05-12 ENCOUNTER — Telehealth (INDEPENDENT_AMBULATORY_CARE_PROVIDER_SITE_OTHER): Payer: Self-pay | Admitting: General Surgery

## 2012-05-12 ENCOUNTER — Encounter (INDEPENDENT_AMBULATORY_CARE_PROVIDER_SITE_OTHER): Payer: Commercial Managed Care - PPO | Admitting: Surgery

## 2012-05-12 NOTE — Telephone Encounter (Signed)
PT CALLED TO NOTIFY DR. Magnus Ivan THAT THE LOWER END OF HER THIGH INCISION HAD OPENED/ APPROXIMATELY 1 INCH, AND SEVERAL STITCHES HAD COME LOOSE. I REVIEWED THIS WITH HIM AND HE SAID FOR HER TO PLACE A PRESSURE DRESSING OVER THE AREA FOR DRAINAGE ACCUMULATION AND THAT HE WOULD NOT RESTITCH THE AREA AT THIS TIME. I ALSO SUGGESTED SHE LOOSELY STERI-STRIP THE OPENING TO FACILITATE DRAINAGE AND TO HELP APPROXIMATE EDGES. ALSO TO REDRESS THE INCISION WITH STERILE GAUZE AND PLACE AN ACE WRAP SINCE SHE IS AT WORK STANDING AND SITTING. I TOLD HER TO CALL IN AM WITH AN UPDATE ON HER CONDITION. GY

## 2012-05-17 ENCOUNTER — Ambulatory Visit (INDEPENDENT_AMBULATORY_CARE_PROVIDER_SITE_OTHER): Payer: Commercial Managed Care - PPO | Admitting: Surgery

## 2012-05-17 ENCOUNTER — Encounter (INDEPENDENT_AMBULATORY_CARE_PROVIDER_SITE_OTHER): Payer: Self-pay | Admitting: Surgery

## 2012-05-17 VITALS — BP 140/80 | HR 84 | Temp 98.0°F | Ht 76.0 in | Wt 240.0 lb

## 2012-05-17 DIAGNOSIS — Z09 Encounter for follow-up examination after completed treatment for conditions other than malignant neoplasm: Secondary | ICD-10-CM

## 2012-05-17 NOTE — Progress Notes (Signed)
Subjective:     Patient ID: [name], female   DOB: Oct 28, 1980, 31 y.o.   MRN: [id number]  HPI  She is here for a postop visit status post wide excision of hidradenitis of both her inner thighs and left groin. She reports some stitches are pulled apart and she is having discomfort. Review of Systems     Objective:   Physical Exam On exam, she is open areas in both her groins. There is no evidence of infection. We removed all the sutures.    Assessment:     Postop chronic hidradenitis    Plan:        I am going to start her on hydrogel wound gel dressing changes daily. I will see her back in 2-3 weeks

## 2012-06-14 ENCOUNTER — Ambulatory Visit (INDEPENDENT_AMBULATORY_CARE_PROVIDER_SITE_OTHER): Payer: Commercial Managed Care - PPO | Admitting: Surgery

## 2012-06-14 ENCOUNTER — Encounter (INDEPENDENT_AMBULATORY_CARE_PROVIDER_SITE_OTHER): Payer: Self-pay | Admitting: Surgery

## 2012-06-14 VITALS — BP 100/68 | HR 78 | Temp 97.7°F | Resp 18 | Ht 65.0 in | Wt 232.5 lb

## 2012-06-14 DIAGNOSIS — Z09 Encounter for follow-up examination after completed treatment for conditions other than malignant neoplasm: Secondary | ICD-10-CM

## 2012-06-14 NOTE — Progress Notes (Signed)
Subjective:     Patient ID: [name], female   DOB: 04/07/1981, 31 y.o.   MRN: [id number]  HPI She is here for a wound check. She reports minimal discomfort.  Review of Systems     Objective:   Physical Exam    On exam, the right thigh area is open with excellent granulation tissue. I treated with silver nitrate. The left incision does show some erythema from the drainage Assessment:     Patient stable status post wide excision of bilateral hidradenitis    Plan:     She will continue local wound care. I will place her on Diflucan for yeast infection. I will see her back in 3 weeks

## 2012-07-02 ENCOUNTER — Ambulatory Visit (INDEPENDENT_AMBULATORY_CARE_PROVIDER_SITE_OTHER): Payer: Self-pay | Admitting: Family Medicine

## 2012-07-02 DIAGNOSIS — E119 Type 2 diabetes mellitus without complications: Secondary | ICD-10-CM

## 2012-07-05 NOTE — Progress Notes (Signed)
Patient presents today for 3 month DM follow-up as part of the employer-sponsored [employer] to [employer] program. Medications, insulin regimen, and glucose readings have been reviewed. I have also discussed with patient lifestyle interventions such as diet and physical activity. Details of this visit can be found in [employer] documenting program through [facility] ([facility]). Patient has set a series of personal goals and will follow-up in 3 months for further review of DM.  

## 2012-07-07 ENCOUNTER — Ambulatory Visit (INDEPENDENT_AMBULATORY_CARE_PROVIDER_SITE_OTHER): Payer: Commercial Managed Care - PPO | Admitting: Surgery

## 2012-07-07 ENCOUNTER — Encounter (INDEPENDENT_AMBULATORY_CARE_PROVIDER_SITE_OTHER): Payer: Self-pay | Admitting: Surgery

## 2012-07-07 VITALS — BP 116/80 | HR 113 | Temp 97.6°F | Ht 64.0 in | Wt 226.6 lb

## 2012-07-07 DIAGNOSIS — Z09 Encounter for follow-up examination after completed treatment for conditions other than malignant neoplasm: Secondary | ICD-10-CM

## 2012-07-07 NOTE — Progress Notes (Signed)
Subjective:     Patient ID: [name], female   DOB: 1981-07-29, 31 y.o.   MRN: [id number]  HPI She is here for another visit. She now reports she is doing very well. The Diflucan cleared up her yeast infection. She reports no drainage.  Review of Systems     Objective:   Physical Exam    both thigh areas are now healing very well. Assessment:      Patient status post wide excision of hidradenitis Bilateral thighs    Plan:     I will see her back as needed

## 2012-07-12 ENCOUNTER — Encounter (HOSPITAL_COMMUNITY): Payer: Self-pay | Admitting: Certified Registered Nurse Anesthetist

## 2012-07-12 ENCOUNTER — Observation Stay (HOSPITAL_COMMUNITY): Payer: 59 | Admitting: Certified Registered Nurse Anesthetist

## 2012-07-12 ENCOUNTER — Inpatient Hospital Stay (HOSPITAL_COMMUNITY)
Admission: AD | Admit: 2012-07-12 | Discharge: 2012-07-14 | DRG: 603 | Disposition: A | Discharge status: Home / Self Care | Payer: 59 | Source: Ambulatory Visit | Attending: Surgery | Admitting: Surgery

## 2012-07-12 ENCOUNTER — Ambulatory Visit (INDEPENDENT_AMBULATORY_CARE_PROVIDER_SITE_OTHER): Payer: Commercial Managed Care - PPO | Admitting: Surgery

## 2012-07-12 ENCOUNTER — Encounter (INDEPENDENT_AMBULATORY_CARE_PROVIDER_SITE_OTHER): Payer: Self-pay | Admitting: Surgery

## 2012-07-12 ENCOUNTER — Encounter (HOSPITAL_COMMUNITY): Admission: AD | Disposition: A | Discharge status: Home / Self Care | Payer: Self-pay | Source: Ambulatory Visit

## 2012-07-12 VITALS — BP 150/80 | HR 108 | Temp 97.6°F | Resp 20 | Ht 64.0 in | Wt 226.8 lb

## 2012-07-12 DIAGNOSIS — F17200 Nicotine dependence, unspecified, uncomplicated: Secondary | ICD-10-CM | POA: Diagnosis present

## 2012-07-12 DIAGNOSIS — E119 Type 2 diabetes mellitus without complications: Secondary | ICD-10-CM | POA: Diagnosis present

## 2012-07-12 DIAGNOSIS — IMO0002 Reserved for concepts with insufficient information to code with codable children: Secondary | ICD-10-CM

## 2012-07-12 DIAGNOSIS — I498 Other specified cardiac arrhythmias: Secondary | ICD-10-CM | POA: Diagnosis present

## 2012-07-12 DIAGNOSIS — F411 Generalized anxiety disorder: Secondary | ICD-10-CM | POA: Diagnosis present

## 2012-07-12 DIAGNOSIS — L02412 Cutaneous abscess of left axilla: Secondary | ICD-10-CM

## 2012-07-12 DIAGNOSIS — F329 Major depressive disorder, single episode, unspecified: Secondary | ICD-10-CM | POA: Diagnosis present

## 2012-07-12 DIAGNOSIS — F3289 Other specified depressive episodes: Secondary | ICD-10-CM | POA: Diagnosis present

## 2012-07-12 HISTORY — DX: Cutaneous abscess of left axilla: L02.412

## 2012-07-12 LAB — GLUCOSE, CAPILLARY
Glucose-Capillary: 225 mg/dL — ABNORMAL HIGH (ref 70–99)
Glucose-Capillary: 250 mg/dL — ABNORMAL HIGH (ref 70–99)

## 2012-07-12 LAB — CBC WITH DIFFERENTIAL/PLATELET
Basophils Absolute: 0 10*3/uL (ref 0.0–0.1)
Eosinophils Absolute: 0.3 10*3/uL (ref 0.0–0.7)
Eosinophils Relative: 2 % (ref 0–5)
HCT: 41.3 % (ref 36.0–46.0)
Lymphocytes Relative: 24 % (ref 12–46)
MCH: 31 pg (ref 26.0–34.0)
MCHC: 34.6 g/dL (ref 30.0–36.0)
MCV: 89.4 fL (ref 78.0–100.0)
Monocytes Absolute: 0.8 10*3/uL (ref 0.1–1.0)
Platelets: 310 10*3/uL (ref 150–400)
RDW: 12.6 % (ref 11.5–15.5)
WBC: 12.5 10*3/uL — ABNORMAL HIGH (ref 4.0–10.5)

## 2012-07-12 LAB — BASIC METABOLIC PANEL
BUN: 10 mg/dL (ref 6–23)
CO2: 24 mEq/L (ref 19–32)
Calcium: 9 mg/dL (ref 8.4–10.5)
Chloride: 99 mEq/L (ref 96–112)
Creatinine, Ser: 0.57 mg/dL (ref 0.50–1.10)
Glucose, Bld: 277 mg/dL — ABNORMAL HIGH (ref 70–99)

## 2012-07-12 LAB — POCT I-STAT 4, (NA,K, GLUC, HGB,HCT)
Glucose, Bld: 222 mg/dL — ABNORMAL HIGH (ref 70–99)
HCT: 41 % (ref 36.0–46.0)
Hemoglobin: 13.9 g/dL (ref 12.0–15.0)
Potassium: 3.8 mEq/L (ref 3.5–5.1)

## 2012-07-12 LAB — SURGICAL PCR SCREEN: MRSA, PCR: NEGATIVE

## 2012-07-12 SURGERY — INCISION AND DRAINAGE, ABSCESS
Anesthesia: General | Site: Axilla | Laterality: Left | Wound class: Dirty or Infected

## 2012-07-12 MED ORDER — MAGIC MOUTHWASH
15.0000 mL | Freq: Four times a day (QID) | ORAL | Status: DC | PRN
  Filled 2012-07-12: qty 15

## 2012-07-12 MED ORDER — HYDROMORPHONE HCL PF 1 MG/ML IJ SOLN
1.0000 mg | INTRAMUSCULAR | Status: DC | PRN
  Administered 2012-07-12 – 2012-07-13 (×5): 1 mg via INTRAVENOUS
  Filled 2012-07-12 (×5): qty 1

## 2012-07-12 MED ORDER — KETOROLAC TROMETHAMINE 30 MG/ML IJ SOLN
INTRAMUSCULAR | Status: DC | PRN
  Administered 2012-07-12: 30 mg via INTRAVENOUS

## 2012-07-12 MED ORDER — SUCCINYLCHOLINE CHLORIDE 20 MG/ML IJ SOLN
INTRAMUSCULAR | Status: DC | PRN
  Administered 2012-07-12: 100 mg via INTRAVENOUS

## 2012-07-12 MED ORDER — HYDROMORPHONE HCL PF 1 MG/ML IJ SOLN: 0.2500 mg | INTRAMUSCULAR | Status: DC | PRN

## 2012-07-12 MED ORDER — ONDANSETRON HCL 4 MG/2ML IJ SOLN: 4.0000 mg | Freq: Four times a day (QID) | INTRAMUSCULAR | Status: DC | PRN

## 2012-07-12 MED ORDER — FLUCONAZOLE 200 MG PO TABS
200.0000 mg | ORAL_TABLET | Freq: Every day | ORAL | Status: DC
  Administered 2012-07-12 – 2012-07-13 (×2): 200 mg via ORAL
  Filled 2012-07-12 (×3): qty 1

## 2012-07-12 MED ORDER — NORGESTIM-ETH ESTRAD TRIPHASIC 0.18/0.215/0.25 MG-35 MCG PO TABS
1.0000 | ORAL_TABLET | Freq: Every day | ORAL | Status: DC
  Administered 2012-07-13 – 2012-07-14 (×2): 1 via ORAL

## 2012-07-12 MED ORDER — LACTATED RINGERS IV SOLN
INTRAVENOUS | Status: DC | PRN
  Administered 2012-07-12: 20:00:00 via INTRAVENOUS

## 2012-07-12 MED ORDER — ALUM & MAG HYDROXIDE-SIMETH 200-200-20 MG/5ML PO SUSP
30.0000 mL | Freq: Four times a day (QID) | ORAL | Status: DC | PRN
  Administered 2012-07-13 – 2012-07-14 (×2): 30 mL via ORAL
  Filled 2012-07-12 (×2): qty 30

## 2012-07-12 MED ORDER — ACETAMINOPHEN 500 MG PO TABS
500.0000 mg | ORAL_TABLET | Freq: Three times a day (TID) | ORAL | Status: DC
  Administered 2012-07-12 – 2012-07-13 (×3): 500 mg via ORAL
  Filled 2012-07-12 (×11): qty 1

## 2012-07-12 MED ORDER — BLISTEX EX OINT
TOPICAL_OINTMENT | CUTANEOUS | Status: AC
  Filled 2012-07-12: qty 10

## 2012-07-12 MED ORDER — HYDROCODONE-ACETAMINOPHEN 5-325 MG PO TABS
1.0000 | ORAL_TABLET | ORAL | Status: DC | PRN
  Administered 2012-07-13 (×6): 1 via ORAL
  Administered 2012-07-14: 2 via ORAL
  Administered 2012-07-14: 1 via ORAL
  Administered 2012-07-14: 2 via ORAL
  Filled 2012-07-12 (×2): qty 1
  Filled 2012-07-12: qty 2
  Filled 2012-07-12 (×2): qty 1
  Filled 2012-07-12: qty 2
  Filled 2012-07-12 (×3): qty 1

## 2012-07-12 MED ORDER — SODIUM CHLORIDE 0.9 % IV SOLN: 250.0000 mL | INTRAVENOUS | Status: DC | PRN

## 2012-07-12 MED ORDER — CLINDAMYCIN PHOSPHATE 600 MG/50ML IV SOLN
600.0000 mg | Freq: Four times a day (QID) | INTRAVENOUS | Status: DC
  Administered 2012-07-12 – 2012-07-14 (×7): 600 mg via INTRAVENOUS
  Filled 2012-07-12 (×9): qty 50

## 2012-07-12 MED ORDER — VANCOMYCIN HCL IN DEXTROSE 1-5 GM/200ML-% IV SOLN
1000.0000 mg | Freq: Two times a day (BID) | INTRAVENOUS | Status: DC
  Filled 2012-07-12: qty 200

## 2012-07-12 MED ORDER — VANCOMYCIN HCL 1000 MG IV SOLR
1000.0000 mg | INTRAVENOUS | Status: DC | PRN
  Administered 2012-07-12: 1500 mg via INTRAVENOUS

## 2012-07-12 MED ORDER — MAGNESIUM HYDROXIDE 400 MG/5ML PO SUSP: 30.0000 mL | Freq: Two times a day (BID) | ORAL | Status: DC | PRN

## 2012-07-12 MED ORDER — ONDANSETRON HCL 4 MG/2ML IJ SOLN
INTRAMUSCULAR | Status: DC | PRN
  Administered 2012-07-12: 4 mg via INTRAVENOUS

## 2012-07-12 MED ORDER — LIDOCAINE HCL (CARDIAC) 20 MG/ML IV SOLN
INTRAVENOUS | Status: DC | PRN
  Administered 2012-07-12: 50 mg via INTRAVENOUS

## 2012-07-12 MED ORDER — VANCOMYCIN HCL 1000 MG IV SOLR
2000.0000 mg | Freq: Once | INTRAVENOUS | Status: DC
  Filled 2012-07-12: qty 2000

## 2012-07-12 MED ORDER — FENTANYL CITRATE 0.05 MG/ML IJ SOLN
INTRAMUSCULAR | Status: DC | PRN
  Administered 2012-07-12: 50 ug via INTRAVENOUS
  Administered 2012-07-12: 100 ug via INTRAVENOUS

## 2012-07-12 MED ORDER — INSULIN PUMP
Freq: Three times a day (TID) | SUBCUTANEOUS | Status: DC
  Administered 2012-07-13: 12.1 via SUBCUTANEOUS
  Administered 2012-07-13 (×2): via SUBCUTANEOUS
  Administered 2012-07-14: 5.5 via SUBCUTANEOUS
  Administered 2012-07-14: 14.8 via SUBCUTANEOUS
  Filled 2012-07-12: qty 1

## 2012-07-12 MED ORDER — DIPHENHYDRAMINE HCL 50 MG/ML IJ SOLN: 12.5000 mg | Freq: Four times a day (QID) | INTRAMUSCULAR | Status: DC | PRN

## 2012-07-12 MED ORDER — MIDAZOLAM HCL 5 MG/5ML IJ SOLN
INTRAMUSCULAR | Status: DC | PRN
  Administered 2012-07-12: 2 mg via INTRAVENOUS

## 2012-07-12 MED ORDER — INSULIN ASPART 100 UNIT/ML ~~LOC~~ SOLN: 0.0000 [IU] | SUBCUTANEOUS | Status: DC

## 2012-07-12 MED ORDER — SODIUM CHLORIDE 0.9 % IJ SOLN: 3.0000 mL | INTRAMUSCULAR | Status: DC | PRN

## 2012-07-12 MED ORDER — ACETAMINOPHEN 325 MG PO TABS: 650.0000 mg | ORAL_TABLET | Freq: Four times a day (QID) | ORAL | Status: DC | PRN

## 2012-07-12 MED ORDER — 0.9 % SODIUM CHLORIDE (POUR BTL) OPTIME
TOPICAL | Status: DC | PRN
  Administered 2012-07-12: 1000 mL

## 2012-07-12 MED ORDER — ACETAMINOPHEN 650 MG RE SUPP: 650.0000 mg | Freq: Four times a day (QID) | RECTAL | Status: DC | PRN

## 2012-07-12 MED ORDER — SODIUM CHLORIDE 0.9 % IJ SOLN
3.0000 mL | Freq: Two times a day (BID) | INTRAMUSCULAR | Status: DC
  Administered 2012-07-12 – 2012-07-13 (×2): 3 mL via INTRAVENOUS

## 2012-07-12 MED ORDER — LACTATED RINGERS IV SOLN: INTRAVENOUS | Status: DC

## 2012-07-12 MED ORDER — PROPOFOL 10 MG/ML IV EMUL
INTRAVENOUS | Status: DC | PRN
  Administered 2012-07-12: 100 mg via INTRAVENOUS
  Administered 2012-07-12: 200 mg via INTRAVENOUS

## 2012-07-12 MED ORDER — CLINDAMYCIN PHOSPHATE 900 MG/50ML IV SOLN
INTRAVENOUS | Status: DC | PRN
  Administered 2012-07-12: 900 mg via INTRAVENOUS

## 2012-07-12 MED ORDER — LIP MEDEX EX OINT
1.0000 "application " | TOPICAL_OINTMENT | Freq: Two times a day (BID) | CUTANEOUS | Status: DC
  Administered 2012-07-12 – 2012-07-14 (×4): 1 via TOPICAL

## 2012-07-12 MED ORDER — BUPIVACAINE-EPINEPHRINE 0.25% -1:200000 IJ SOLN
INTRAMUSCULAR | Status: DC | PRN
  Administered 2012-07-12: 50 mL

## 2012-07-12 MED ORDER — MEPERIDINE HCL 25 MG/ML IJ SOLN: 6.2500 mg | INTRAMUSCULAR | Status: DC | PRN

## 2012-07-12 SURGICAL SUPPLY — 32 items
BANDAGE GAUZE ELAST BULKY 4 IN (GAUZE/BANDAGES/DRESSINGS) ×1 IMPLANT
BLADE SURG 15 STRL LF DISP TIS (BLADE) ×1 IMPLANT
BLADE SURG 15 STRL SS (BLADE) ×2
CANISTER SUCTION 2500CC (MISCELLANEOUS) ×2 IMPLANT
CLOTH BEACON ORANGE TIMEOUT ST (SAFETY) ×2 IMPLANT
COVER SURGICAL LIGHT HANDLE (MISCELLANEOUS) ×2 IMPLANT
DECANTER SPIKE VIAL GLASS SM (MISCELLANEOUS) ×1 IMPLANT
DRAPE LAPAROSCOPIC ABDOMINAL (DRAPES) IMPLANT
DRSG PAD ABDOMINAL 8X10 ST (GAUZE/BANDAGES/DRESSINGS) ×1 IMPLANT
ELECT CAUTERY BLADE 6.4 (BLADE) ×2 IMPLANT
ELECT REM PT RETURN 9FT ADLT (ELECTROSURGICAL) ×2
ELECTRODE REM PT RTRN 9FT ADLT (ELECTROSURGICAL) ×1 IMPLANT
GLOVE BIO SURGEON STRL SZ7.5 (GLOVE) ×1 IMPLANT
GOWN STRL NON-REIN LRG LVL3 (GOWN DISPOSABLE) ×2 IMPLANT
KIT BASIN OR (CUSTOM PROCEDURE TRAY) ×2 IMPLANT
NDL HYPO 25X1 1.5 SAFETY (NEEDLE) IMPLANT
NEEDLE HYPO 25X1 1.5 SAFETY (NEEDLE) IMPLANT
NS IRRIG 1000ML POUR BTL (IV SOLUTION) ×2 IMPLANT
PENCIL BUTTON HOLSTER BLD 10FT (ELECTRODE) ×2 IMPLANT
SPONGE GAUZE 4X4 12PLY (GAUZE/BANDAGES/DRESSINGS) ×1 IMPLANT
SPONGE LAP 18X18 X RAY DECT (DISPOSABLE) ×2 IMPLANT
SUT MNCRL AB 4-0 PS2 18 (SUTURE) IMPLANT
SUT VIC AB 3-0 SH 27 (SUTURE)
SUT VIC AB 3-0 SH 27XBRD (SUTURE) IMPLANT
SWAB COLLECTION DEVICE MRSA (MISCELLANEOUS) ×1 IMPLANT
SYR BULB 3OZ (MISCELLANEOUS) ×2 IMPLANT
SYR CONTROL 10ML LL (SYRINGE) IMPLANT
TAPE CLOTH SURG 4X10 WHT LF (GAUZE/BANDAGES/DRESSINGS) ×1 IMPLANT
TOWEL OR 17X26 10 PK STRL BLUE (TOWEL DISPOSABLE) ×2 IMPLANT
TUBE ANAEROBIC SPECIMEN COL (MISCELLANEOUS) ×1 IMPLANT
WATER STERILE IRR 1000ML POUR (IV SOLUTION) IMPLANT
YANKAUER SUCT BULB TIP NO VENT (SUCTIONS) ×2 IMPLANT

## 2012-07-12 NOTE — Anesthesia Preprocedure Evaluation (Addendum)
Anesthesia Evaluation  Patient identified by MRN, date of birth, ID band Patient awake    Reviewed: Allergy & Precautions, H&P , NPO status , Patient's Chart, lab work & pertinent test results  Airway Mallampati: II TM Distance: >3 FB Neck ROM: Full    Dental No notable dental hx.    Pulmonary neg pulmonary ROS, Current Smoker,  breath sounds clear to auscultation  Pulmonary exam normal       Cardiovascular negative cardio ROS  + dysrhythmias Supra Ventricular Tachycardia Rhythm:Regular Rate:Normal     Neuro/Psych PSYCHIATRIC DISORDERS Anxiety Depression negative neurological ROS  negative psych ROS   GI/Hepatic negative GI ROS, Neg liver ROS,   Endo/Other  negative endocrine ROSdiabetes, Type 1, Insulin Dependent  Renal/GU negative Renal ROS  negative genitourinary   Musculoskeletal negative musculoskeletal ROS (+)   Abdominal   Peds negative pediatric ROS (+)  Hematology negative hematology ROS (+)   Anesthesia Other Findings   Reproductive/Obstetrics negative OB ROS                           Anesthesia Physical Anesthesia Plan  ASA: III and Emergent  Anesthesia Plan: General   Post-op Pain Management:    Induction: Intravenous, Rapid sequence and Cricoid pressure planned  Airway Management Planned: Oral ETT  Additional Equipment:   Intra-op Plan:   Post-operative Plan: Extubation in OR  Informed Consent: I have reviewed the patients History and Physical, chart, labs and discussed the procedure including the risks, benefits and alternatives for the proposed anesthesia with the patient or authorized representative who has indicated his/her understanding and acceptance.   Dental advisory given  Plan Discussed with: CRNA  Anesthesia Plan Comments:        Anesthesia Quick Evaluation

## 2012-07-12 NOTE — Progress Notes (Signed)
Subjective:     Patient ID: [name], female   DOB: [birth year], 31 y.o.   MRN: [id number]  HPI  She presents today with a large left axillary abscess. Please see her admission history and physical. She is being admitted to the hospital Review of Systems     Objective:   Physical Exam There is a very large left axillary abscess    Assessment:     Left axillary abscess    Plan:     Admit to hospital

## 2012-07-12 NOTE — Op Note (Signed)
07/12/2012  8:58 PM  PATIENT:  [name]  31 y.o. female  Patient Care Team: Cala Bradford, MD as PCP - General (Family Medicine)  PRE-OPERATIVE DIAGNOSIS:  Abscess of left axilla  POST-OPERATIVE DIAGNOSIS:  Abscess of left axilla  PROCEDURE:  Procedure(s): INCISION AND DRAINAGE ABSCESS  SURGEON:  Surgeon(s): Ardeth Sportsman, MD  ASSISTANT: none   ANESTHESIA:   local and general  EBL:  Total I/O In: -  Out: 200 [Urine:200]  Delay start of Pharmacological VTE agent (>24hrs) due to surgical blood loss or risk of bleeding:  no  DRAINS: none   SPECIMEN:  Source of Specimen:  Left axillary abscess  DISPOSITION OF SPECIMEN:  Microbiology  COUNTS:  YES  PLAN OF CARE: Admit for overnight observation  PATIENT DISPOSITION:  PACU - hemodynamically stable.  INDICATION: Young female with history of thigh infections.  He developed painful abscess in left axilla.  Rapidly worse.  He too large and painful to be drained in the clinic.  Admitted.  Placed on IV antibiotics.  We recommended drainage:  The anatomy and physiology of skin abscesses was discussed. Pathophysiology of SQ abscess, possible progression to fasciitis & sepsis, etc discussed . I stressed good hygiene & wound care.  Possible redebridement was discussed as well.  Possibility of recurrence was discussed. Risks, benefits, alternatives were discussed. I noted a good likelihood this will help address the problem.   Risks of anesthesia and other risks discussed. Questions answered.  The patient is considering surgery. They wish to proceed.  OR FINDINGS: 9 x 6 x 4 cm abscess in left axilla.  Primarily and subcutaneous tissues.  15 x 12 x 10 cm area of induration and cellulitis.  DESCRIPTION: Informed consent was confirmed.   He received IV antibiotics. He underwent general anesthesia without any difficulty. He was positioned supine.  Left upper tremor and chest including axilla was prepped and draped in a sterile  fashion.  A surgical timeout confirmed our plan.  I used an 18-gauge needle and aspirated 10mL frank pus over the most fluctuant center of the abscess.  I placed local anesthetic.  I did a curved incision over the most fluctuant area of the mass x6 cm.  Released a large volume of pus.  Good finger fracture to break deeper pockets.  I ensured hemostasis.  We redid over a liter saline.  I assured hemostasis.  I packed the wound with Betadine soaked 4 x 4's gauze.  Sterile dressings applied.  Patient seen being extubated go to recovery room.  I will try and locate family to discuss operative findings.  I think she would benefit from IV antibiotics for at least overnight, possibly longer.  We will followup on cultures to help direct more specific antibiotic selection.

## 2012-07-12 NOTE — Preoperative (Signed)
Beta Blockers   Reason not to administer Beta Blockers:Not Applicable 

## 2012-07-12 NOTE — H&P (Signed)
[name] is an 31 y.o. female.   Chief Complaint: left axillary abscess HPI: this is a very pleasant 31 year old female with diabetes and a history of hidradenitis status post excision recently her bilateral thighs. These have healed up. She developed tenderness and abscess in her axilla over the weekend. This is the left side. She now has moderate to severe left axillary pain.  Past Medical History  Diagnosis Date  . Hx MRSA infection   . Migraine   . Vitamin D insufficiency   . Depression   . Asthma     as a child  . GERD (gastroesophageal reflux disease)     no current meds.  . Hidradenitis 04/2012    bilat. thighs, left groin - open areas on thighs  . Diabetes mellitus     IDDM, Insulin pump; followed by Dr. Talmage Coin    Past Surgical History  Procedure Date  . Wisdom tooth extraction   . Tonsillectomy   . Bunionectomy     left  . Esophagogastroduodenoscopy 11/17/2011    Procedure: ESOPHAGOGASTRODUODENOSCOPY (EGD);  Surgeon: Shirley Friar, MD;  Location: [facility];  Service: Endoscopy;  Laterality: N/A;  . Eye surgery     exc. stye left eye  . Breast surgery     right lumpectomy  . Cholecystectomy 12/02/2006    lap. chole.  . Dilation and evacuation 02/14/2007  . Hydradenitis excision 04/30/2012    Procedure: EXCISION HYDRADENITIS GROIN;  Surgeon: Shelly Rubenstein, MD;  Location: [facility];  Service: General;  Laterality: Left;  wide excision hidradenitis bilateral thighs and Left groin    History reviewed. No pertinent family history. Social History:  reports that she has been smoking Cigarettes.  She has smoked for the past 14 years. She has never used smokeless tobacco. She reports that she does not drink alcohol or use illicit drugs.  Allergies:  Allergies  Allergen Reactions  . Levofloxacin Shortness Of Breath and Rash  . Moxifloxacin Shortness Of Breath and Rash  . Peach (Prunus Persica) Hives and Shortness Of Breath  .  Potassium-Containing Compounds Other (See Comments)    IV ROUTE - CAUSES VEINS TO COLLAPSE  . Prednisone Other (See Comments)    SEVERE ELEVATION OF BLOOD SUGAR  . Propoxyphene-Acetaminophen Swelling    SWELLING OF FACE AND THROAT  . Rosiglitazone Maleate Swelling    SWELLING OF FACE AND LEGS  . Adhesive (Tape) Itching and Rash  . Cefaclor Rash  . Keflex (Cephalexin) Diarrhea and Rash  . Latex Hives and Itching  . Oxycodone-Acetaminophen Swelling and Rash  . Penicillins Rash  . Promethazine Hcl Other (See Comments)    IV ROUTE ONLY - JITTERY FEELING  . Sulfadiazine Rash     (Not in a hospital admission)  No results found for this or any previous visit (from the past 48 hour(s)). No results found.  Review of Systems  All other systems reviewed and are negative.    Blood pressure 150/80, pulse 108, temperature 97.6 F (36.4 C), temperature source Temporal, resp. rate 20, height 5\' 4"  (1.626 m), weight 226 lb 12.8 oz (102.876 kg). Physical Exam  Constitutional: She is oriented to person, place, and time. She appears well-developed and well-nourished. She appears distressed.  HENT:  Head: Normocephalic and atraumatic.  Right Ear: External ear normal.  Left Ear: External ear normal.  Nose: Nose normal.  Mouth/Throat: Oropharynx is clear and moist.  Eyes: Conjunctivae normal are normal. Pupils are equal, round, and reactive to  light. Right eye exhibits no discharge. Left eye exhibits discharge. No scleral icterus.  Neck: Normal range of motion. Neck supple.  Cardiovascular: Normal rate, regular rhythm, normal heart sounds and intact distal pulses.   No murmur heard. Respiratory: Effort normal and breath sounds normal. No respiratory distress. She has no wheezes.  Musculoskeletal: Normal range of motion.  Neurological: She is alert and oriented to person, place, and time.  Skin: Skin is warm. She is not diaphoretic. There is erythema.       There is a large, fluctuant abscess  with induration and erythema in the left axilla  Psychiatric: Her behavior is normal. Judgment normal.     Assessment/Plan Left axillary abscess  She will be admitted to the hospital for IV antibiotics and incision and drainage of the axillary abscess. I explained this to her in detail and she is in agreement. I will notify  Our surgeon on call.  Aydan Levitz A 07/12/2012, 4:22 PM

## 2012-07-12 NOTE — Progress Notes (Signed)
ANTIBIOTIC CONSULT NOTE - INITIAL  Pharmacy Consult for vancomycin Indication: axillary abscess  Allergies  Allergen Reactions  . Levofloxacin Shortness Of Breath and Rash  . Moxifloxacin Shortness Of Breath and Rash  . Peach (Prunus Persica) Hives and Shortness Of Breath  . Potassium-Containing Compounds Other (See Comments)    IV ROUTE - CAUSES VEINS TO COLLAPSE  . Prednisone Other (See Comments)    SEVERE ELEVATION OF BLOOD SUGAR  . Propoxyphene-Acetaminophen Swelling    SWELLING OF FACE AND THROAT  . Rosiglitazone Maleate Swelling    SWELLING OF FACE AND LEGS  . Adhesive (Tape) Itching and Rash  . Cefaclor Rash  . Keflex (Cephalexin) Diarrhea and Rash  . Latex Hives and Itching  . Oxycodone-Acetaminophen Swelling and Rash    NORCO/VICODIN OK  . Penicillins Rash  . Promethazine Hcl Other (See Comments)    IV ROUTE ONLY - JITTERY FEELING  . Sulfadiazine Rash    Patient Measurements: Height: 5' 4.17" (163 cm) Weight: 226 lb 13.7 oz (102.9 kg) IBW/kg (Calculated) : 55.1   Vital Signs: Temp: 97.6 F (36.4 C) (10/21 1605) Temp src: Temporal (10/21 1605) BP: 150/80 mmHg (10/21 1605) Pulse Rate: 108  (10/21 1605) Intake/Output from previous day:   Intake/Output from this shift:    Labs:  [facility] 07/12/12 1823  WBC 12.5*  HGB 14.3  PLT 310  LABCREA --  CREATININE 0.57   Estimated Creatinine Clearance: 120.4 ml/min (by C-G formula based on Cr of 0.57). No results found for this basename: VANCOTROUGH:2,VANCOPEAK:2,VANCORANDOM:2,GENTTROUGH:2,GENTPEAK:2,GENTRANDOM:2,TOBRATROUGH:2,TOBRAPEAK:2,TOBRARND:2,AMIKACINPEAK:2,AMIKACINTROU:2,AMIKACIN:2, in the last 72 hours   Microbiology: No results found for this or any previous visit (from the past 720 hour(s)).  Medical History: Past Medical History  Diagnosis Date  . Hx MRSA infection   . Migraine   . Vitamin D insufficiency   . Depression   . Asthma     as a child  . GERD (gastroesophageal reflux disease)     no current meds.  . Hidradenitis 04/2012    bilat. thighs, left groin - open areas on thighs  . Diabetes mellitus     IDDM, Insulin pump; followed by Dr. Talmage Coin    Medications:  Scheduled:    . insulin aspart  0-15 Units Subcutaneous Q4H  . vancomycin  2,000 mg Intravenous Once   Infusions:   [name]  Vancomycin per pharmacy  CrCl > 100 ml/min/1.54m2  Goal of Therapy:  Vancomycin trough level 15-20 mcg/ml  Plan:  1) Vancomycin 2g IV x 1 for LD 2) Vancomycin 1g IV q12 thereafter 3) Trough at steady state if necessary  [name], PharmD, BCPS Pager [phone] 07/12/2012 7:01 PM

## 2012-07-12 NOTE — Transfer of Care (Signed)
Immediate Anesthesia Transfer of Care Note  Patient: [name]  Procedure(s) Performed: Procedure(s) (LRB) with comments: INCISION AND DRAINAGE ABSCESS (Left)  Patient Location: PACU  Anesthesia Type: General  Level of Consciousness: awake and alert   Airway & Oxygen Therapy: Patient Spontanous Breathing and Patient connected to face mask oxygen  Post-op Assessment: Report given to PACU RN and Post -op Vital signs reviewed and stable  Post vital signs: Reviewed and stable  Complications: No apparent anesthesia complications

## 2012-07-13 NOTE — Progress Notes (Signed)
Inpatient Diabetes Program Recommendations  AACE/ADA: New Consensus Statement on Inpatient Glycemic Control (2013)  Target Ranges:  Prepandial:   less than 140 mg/dL      Peak postprandial:   less than 180 mg/dL (1-2 hours)      Critically ill patients:  140 - 180 mg/dL   Reason for Visit: Patient using insulin pump   Note: Patient receptive to my visit.  Patient using a [employer]onic insulin pump.  Pump managed by Dr. Sharl Ma.  Settings as follows: Basal rates: 1200 am = 2 units/hr         0400 = 2 units/hr         1100 = 1.4 units/hr         2000 = 2 units/hr   Total basal insulin/ 24 hrs = 42.6  CHO ratio is 1:4.5 Sensitivity factor is 13 Target is 100 mg/dl  Patient independent with insulin pump function.  Having problems with glycemic control-- last Hgb A1C was 10.4, but states she is working on that.  Please page the Diabetes Coordinator on call regarding any insulin pump-related issues from 0800 to 2000 at [phone].  Lakendria Nicastro S. Decorey Wahlert, RN, CNS, CDE

## 2012-07-13 NOTE — Progress Notes (Signed)
Patient ID: [name], female   DOB: [birth year], 31 y.o.   MRN: [id number] 1 Day Post-Op  Subjective: Pt reports axilla feels better then before surgery but concerned about dressing changes, no other complaints  Objective: Vital signs in last 24 hours: Temp:  [97.6 F (36.4 C)-98.9 F (37.2 C)] 98.5 F (36.9 C) (10/22 0501) Pulse Rate:  [82-120] 82  (10/22 0501) Resp:  [13-20] 14  (10/22 0501) BP: (96-150)/(21-88) 96/66 mmHg (10/22 0501) SpO2:  [94 %-100 %] 97 % (10/22 0501) Weight:  [226 lb 12.8 oz (102.876 kg)-226 lb 13.7 oz (102.9 kg)] 226 lb 13.7 oz (102.9 kg) (10/21 1700) Last BM Date: 07/12/12  Intake/Output from previous day: 10/21 0701 - 10/22 0700 In: 800 [I.V.:750; IV Piggyback:50] Out: 380 [Urine:380] Intake/Output this shift:    PE: Ext: left axilla wound very tender with dressing removal, wound healthy with min slough in base, no significant erythema  Lab Results:   Basename 07/12/12 2000 07/12/12 1823  WBC -- 12.5*  HGB 13.9 14.3  HCT 41.0 41.3  PLT -- 310   BMET  Basename 07/12/12 2000 07/12/12 1823  NA 142 133*  K 3.8 6.9*  CL -- 99  CO2 -- 24  GLUCOSE 222* 277*  BUN -- 10  CREATININE -- 0.57  CALCIUM -- 9.0   PT/INR No results found for this basename: LABPROT:2,INR:2 in the last 72 hours CMP     Component Value Date/Time   NA 142 07/12/2012 2000   K 3.8 07/12/2012 2000   CL 99 07/12/2012 1823   CO2 24 07/12/2012 1823   GLUCOSE 222* 07/12/2012 2000   BUN 10 07/12/2012 1823   CREATININE 0.57 07/12/2012 1823   CALCIUM 9.0 07/12/2012 1823   CALCIUM 8.9 08/04/2008 2210   PROT 7.4 08/18/2011 0855   ALBUMIN 3.9 08/18/2011 0855   AST 13 08/18/2011 0855   ALT 12 08/18/2011 0855   ALKPHOS 96 08/18/2011 0855   BILITOT 0.4 08/18/2011 0855   GFRNONAA >90 07/12/2012 1823   GFRAA >90 07/12/2012 1823   Lipase     Component Value Date/Time   LIPASE 23 04/01/2010 1503       Studies/Results: No results  found.  Anti-infectives: Anti-infectives     Start     Dose/Rate Route Frequency Ordered Stop   07/13/12 0800   vancomycin (VANCOCIN) IVPB 1000 mg/200 mL premix  Status:  Discontinued        1,000 mg 200 mL/hr over 60 Minutes Intravenous Every 12 hours 07/12/12 1902 07/12/12 2147   07/12/12 2200   clindamycin (CLEOCIN) IVPB 600 mg        600 mg 100 mL/hr over 30 Minutes Intravenous 4 times per day 07/12/12 2147     07/12/12 2200   fluconazole (DIFLUCAN) tablet 200 mg        200 mg Oral Daily at bedtime 07/12/12 2147     07/12/12 1800   vancomycin (VANCOCIN) 2,000 mg in sodium chloride 0.9 % 500 mL IVPB  Status:  Discontinued        2,000 mg 250 mL/hr over 120 Minutes Intravenous  Once 07/12/12 1735 07/12/12 2147           Assessment/Plan  1.  POD#1-left axilla abscess I&D: overall doing well x severe pain with dressing change   --wet to dry dressing changes started today, will need pre-medication for pain   --cont clinda IV for today and then convert to PO tomorrow   --Poss home tomorrow.  LOS:  1 day    Cassondra Stachowski 07/13/2012

## 2012-07-13 NOTE — Progress Notes (Signed)
Spoke to Dr. Michaell Cowing concerning patients insulin pump and insulin orders.  New orders received.

## 2012-07-13 NOTE — Anesthesia Postprocedure Evaluation (Signed)
  Anesthesia Post-op Note  Patient: [name]  Procedure(s) Performed: Procedure(s) (LRB): INCISION AND DRAINAGE ABSCESS (Left)  Patient Location: PACU  Anesthesia Type: General  Level of Consciousness: awake and alert   Airway and Oxygen Therapy: Patient Spontanous Breathing  Post-op Pain: mild  Post-op Assessment: Post-op Vital signs reviewed, Patient's Cardiovascular Status Stable, Respiratory Function Stable, Patent Airway and No signs of Nausea or vomiting  Post-op Vital Signs: stable  Complications: No apparent anesthesia complications

## 2012-07-14 ENCOUNTER — Encounter (INDEPENDENT_AMBULATORY_CARE_PROVIDER_SITE_OTHER): Payer: Self-pay | Admitting: General Surgery

## 2012-07-14 ENCOUNTER — Encounter (HOSPITAL_COMMUNITY): Payer: Self-pay | Admitting: *Deleted

## 2012-07-14 LAB — GLUCOSE, CAPILLARY
Glucose-Capillary: 153 mg/dL — ABNORMAL HIGH (ref 70–99)
Glucose-Capillary: 118 mg/dL — ABNORMAL HIGH (ref 70–99)

## 2012-07-14 MED ORDER — HYDROCODONE-ACETAMINOPHEN 5-325 MG PO TABS: 1.0000 | ORAL_TABLET | ORAL | Status: DC | PRN

## 2012-07-14 MED ORDER — CLINDAMYCIN HCL 300 MG PO CAPS: 600.0000 mg | ORAL_CAPSULE | Freq: Two times a day (BID) | ORAL | Status: DC

## 2012-07-14 NOTE — Care Management Note (Signed)
    Page 1 of 2   07/14/2012     11:31:34 AM   CARE MANAGEMENT NOTE 07/14/2012  Patient:  [name], [name]   Account Number:  [id number]  Date Initiated:  07/14/2012  Documentation initiated by:  Colleen Can  Subjective/Objective Assessment:   DX : left axilla abscess; Incision and drainage     Action/Plan:   Cm spoke with patient'. Plans are for patient to return home upon discharge. No HH needs assessed. Pt is ambulatory   Anticipated DC Date:  07/14/2012   Anticipated DC Plan:  HOME/SELF CARE  In-house referral  NA      DC Planning Services  CM consult      PAC Choice  NA   Choice offered to / List presented to:  NA   DME arranged  NA      DME agency  NA     HH arranged  NA      HH agency  NA   Status of service:  Completed, signed off Medicare Important Message given?  NO (If response is "NO", the following Medicare IM given date fields will be blank) Date Medicare IM given:   Date Additional Medicare IM given:    Discharge Disposition:    Per UR Regulation:    If discussed at Long Length of Stay Meetings, dates discussed:    Comments:

## 2012-07-14 NOTE — Discharge Summary (Signed)
Physician Discharge Summary  Patient ID: [name] MRN: [id number] DOB/AGE: [birth year] 30 y.o.  Admit date: 07/12/2012 Discharge date: 07/14/2012  Admitting Diagnosis: Left axilla abscess  Discharge Diagnosis Patient Active Problem List   Diagnosis Date Noted  . Abscess of left axilla 07/12/2012  . Hidradenitis suppurativa 04/21/2012  . Hydradenitis 03/31/2012  . Esophageal reflux 11/17/2011  . SOB (shortness of breath) 04/24/2011  . ALLERGIC RHINITIS 05/20/2010  . UPPER RESPIRATORY INFECTION, ACUTE 10/17/2009  . HEADACHE 09/19/2009  . COUGH 08/22/2009  . SEBACEOUS CYST 06/05/2008  . NAUSEA ALONE 03/17/2008  . BRONCHITIS, ACUTE 11/29/2007  . DIABETES W/KETOACIDOSIS TYPE I [JUV] UNCNTRL 11/14/2007  . HYPONATREMIA 11/14/2007  . LEUKOCYTOSIS 11/14/2007  . ANXIETY STATE, UNSPECIFIED 10/24/2007  . TACHYCARDIA, PAROXYSMAL ATRIAL 07/07/2007  . DIABETES MELLITUS, TYPE I 04/08/2007  . SMOKER 04/08/2007  . DEPRESSION 04/08/2007  . ASTHMA 04/08/2007  . MIGRAINES, HX OF 04/08/2007    Consultants None  Procedures INCISION AND DRAINAGE OF LEFT AXILLA ABSCESS  Hospital Course:  31 yr old female with history of lower extremity abscess who presented to our office with left axilla abscess.  She was admitted and underwent the procedure above.  She tolerated this well.  Cultures were taken.  Due to patient's extensive allergies she was started on Clindamycin as this would cover staph.  On POD#2, the patient was tolerating dressing changes, tolerating diet, pain controlled, afebrile and felt stable for discharge home.  She will follow up with Dr. Michaell Cowing in 2-3 weeks.  She will continue wet to dry dressing changes twice daily.  She will continue clinda for 1 week.     Medication List     As of 07/14/2012 11:42 AM    TAKE these medications         clindamycin 300 MG capsule   Commonly known as: CLEOCIN   Take 2 capsules (600 mg total) by mouth 2 (two) times daily.     GLUCAGEN 1 MG Solr   Generic drug: glucagon   Inject 1 mg into the vein once as needed.      HYDROcodone-acetaminophen 5-325 MG per tablet   Commonly known as: NORCO/VICODIN   Take 1-2 tablets by mouth every 4 (four) hours as needed.      insulin glargine 100 UNIT/ML injection   Commonly known as: LANTUS   Inject into the skin at bedtime.      insulin lispro 100 UNIT/ML injection   Commonly known as: HUMALOG   Inject 75-100 Units into the skin continuous. Via Insulin pump, bolus at meals      ONE TOUCH ULTRA SYSTEM KIT W/DEVICE Kit   1 kit by Does not apply route once.      TRINESSA (28) 0.18/0.215/0.25 MG-35 MCG tablet   Generic drug: Norgestimate-Ethinyl Estradiol Triphasic   Take 1 tablet by mouth daily.             Follow-up Information    Follow up with Ardeth Sportsman., MD. On 08/02/2012. (Arrive at 10:30am for 10:45am appointment)    Contact information:   [address] [place] [address] [phone]          Signed: Denny Levy [facility] [phone]  07/14/2012, 11:42 AM

## 2012-07-14 NOTE — Progress Notes (Signed)
Pt for d/c home today. IV to D/C after completing IV Clindamycin. Dressing changed to L axilla. Dressing supplies provided for home use. No changes in am assessments for today. Pt's mom to pick up pt & to assist with d/c. D/C instructions & RX given with verbalized understanding.

## 2012-07-14 NOTE — Progress Notes (Signed)
Discharge summary sent to payer through [facility]  

## 2012-07-15 ENCOUNTER — Encounter: Payer: Self-pay | Admitting: Family Medicine

## 2012-07-15 NOTE — Progress Notes (Signed)
Patient ID: [name], female   DOB: Feb 25, 1981, 31 y.o.   MRN: [id number] Reviewed and agree with this documentation and management.

## 2012-07-19 LAB — ANAEROBIC CULTURE

## 2012-07-23 ENCOUNTER — Other Ambulatory Visit: Payer: Self-pay | Admitting: Family Medicine

## 2012-07-23 DIAGNOSIS — N6019 Diffuse cystic mastopathy of unspecified breast: Secondary | ICD-10-CM

## 2012-07-29 ENCOUNTER — Other Ambulatory Visit: Payer: Self-pay

## 2012-07-29 ENCOUNTER — Inpatient Hospital Stay
Admission: RE | Admit: 2012-07-29 | Discharge: 2012-07-29 | Payer: Self-pay | Source: Ambulatory Visit | Attending: Family Medicine | Admitting: Family Medicine

## 2012-08-02 ENCOUNTER — Ambulatory Visit
Admission: RE | Admit: 2012-08-02 | Discharge: 2012-08-02 | Disposition: A | Discharge status: Home / Self Care | Payer: 59 | Source: Ambulatory Visit | Attending: Family Medicine | Admitting: Family Medicine

## 2012-08-02 ENCOUNTER — Encounter (INDEPENDENT_AMBULATORY_CARE_PROVIDER_SITE_OTHER): Payer: Self-pay | Admitting: Surgery

## 2012-08-02 ENCOUNTER — Ambulatory Visit (INDEPENDENT_AMBULATORY_CARE_PROVIDER_SITE_OTHER): Payer: Commercial Managed Care - PPO | Admitting: Surgery

## 2012-08-02 VITALS — BP 122/80 | HR 95 | Temp 98.6°F | Resp 18 | Ht 65.0 in | Wt 220.0 lb

## 2012-08-02 DIAGNOSIS — IMO0002 Reserved for concepts with insufficient information to code with codable children: Secondary | ICD-10-CM

## 2012-08-02 DIAGNOSIS — L02412 Cutaneous abscess of left axilla: Secondary | ICD-10-CM

## 2012-08-02 DIAGNOSIS — N6019 Diffuse cystic mastopathy of unspecified breast: Secondary | ICD-10-CM

## 2012-08-02 NOTE — Patient Instructions (Addendum)
The abscess in her left armpit is healed.  Please call us if things change.  Continue plan of breast radiography workup.  .  If any abnormality, please see [facility], Dr. Carman Ching, for further issues.  Breast Self-Exam A self breast exam may help you find changes or problems while they are still small. Do a breast self-exam:  Every month.  One week after your period (menstrual period).  On the first day of each month if you do not have periods anymore. Look for any:  Change in breast color, size, or shape.  Dimples in your breast.  Changes in your nipples or skin.  Dry skin on your breasts or nipples.  Watery or bloody discharge from your nipples.  Feel for:  Lumps.  Thick, hard places.  Any other changes. HOME CARE There are 3 ways to do the breast self-exam: In front of a mirror.  Lift your arms over your head and turn side to side.  Put your hands on your hips and lean down, then turn from side to side.  Bend forward and turn from side to side. In the shower.  With soapy hands, check both breasts. Then check above and below your collarbone and your armpits.  Feel above and below your collarbone down to under your breast, and from the center of your chest to the outer edge of the armpit. Check for any lumps or hard spots.  Using the tips of your middle three fingers check your whole breast by pressing your hand over your breast in a circle or in an up and down motion. Lying down.  Lie flat on your bed.  Put a small pillow under the breast you are going to check. On that same side, put your hand behind your head.  With your other hand, use the 3 middle fingers to feel the breast.  Move your fingers in a circle around the breast. Press firmly over all parts of the breast to feel for any lumps. GET HELP RIGHT AWAY IF: You find any changes in your breasts so they can be checked. Document Released: 02/25/2008 Document Revised: 12/01/2011 Document Reviewed:  12/27/2008 [facility] Patient Information 2013 [place], [place].

## 2012-08-02 NOTE — Progress Notes (Signed)
Subjective:     Patient ID: [name], female   DOB: 06-12-1981, 31 y.o.   MRN: [id number]  HPI  [name]  June 09, 1981 [id number]  Patient Care Team: Cala Bradford, MD as PCP - General (Family Medicine)  This patient is a 31 y.o.female who presents today for surgical evaluation.   Procedure: Incision and drainage of left axillary abscess 07/12/2012  Culture =  Prevotella Bivia and coagulase negative Staphylococcus aureus  The patient comes in today feeling much better.  Pain has gone away.  Completed oral clindamycin antibiotics.  Tolerated that fine.  Can no longer packed the wound.  No fevers or chills.  Glucose levels have been good.  Saw her primary care physician.  Dr. Cliffton Asters was concern of some mild fullness on the left breast and ordered mammogram.  Patient denies any nipple discharge or pain.  Cannot feel anything very discrete herself.    Patient Active Problem List  Diagnosis  . DIABETES MELLITUS, TYPE I  . HYPONATREMIA  . ANXIETY STATE, UNSPECIFIED  . SMOKER  . DEPRESSION  . TACHYCARDIA, PAROXYSMAL ATRIAL  . BRONCHITIS, ACUTE  . ALLERGIC RHINITIS  . ASTHMA  . MIGRAINES, HX OF  . Esophageal reflux  . Hidradenitis suppurativa    Past Medical History  Diagnosis Date  . Hx MRSA infection   . Migraine   . Vitamin D insufficiency   . Depression   . Asthma     as a child  . GERD (gastroesophageal reflux disease)     no current meds.  . Hidradenitis 04/2012    bilat. thighs, left groin - open areas on thighs  . Diabetes mellitus     IDDM, Insulin pump; followed by Dr. Talmage Coin  . Abscess of left axilla - PREVOTELLA BIVIA & Staph Coag Neg 07/12/2012    MODERATE PREVOTELLA BIVIA Note: BETA LACTAMASE NEGATIVE      Past Surgical History  Procedure Date  . Wisdom tooth extraction   . Tonsillectomy   . Bunionectomy     left  . Esophagogastroduodenoscopy 11/17/2011    Procedure: ESOPHAGOGASTRODUODENOSCOPY (EGD);  Surgeon: Shirley Friar,  MD;  Location: [facility];  Service: Endoscopy;  Laterality: N/A;  . Eye surgery     exc. stye left eye  . Breast surgery     right lumpectomy  . Cholecystectomy 12/02/2006    lap. chole.  . Dilation and evacuation 02/14/2007  . Hydradenitis excision 04/30/2012    Procedure: EXCISION HYDRADENITIS GROIN;  Surgeon: Shelly Rubenstein, MD;  Location: [facility];  Service: General;  Laterality: Left;  wide excision hidradenitis bilateral thighs and Left groin    History   Social History  . Marital Status: Divorced    Spouse Name: N/A    Number of Children: N/A  . Years of Education: N/A   Occupational History  . Not on file.   Social History Main Topics  . Smoking status: Current Every Day Smoker -- 14 years    Types: Cigarettes  . Smokeless tobacco: Never Used     Comment: 5 cig./day  . Alcohol Use: No  . Drug Use: No  . Sexually Active: Yes    Birth Control/ Protection: Pill   Other Topics Concern  . Not on file   Social History Narrative  . No narrative on file    No family history on file.  Current Outpatient Prescriptions  Medication Sig Dispense Refill  . Blood Glucose Monitoring Suppl (ONE TOUCH ULTRA  SYSTEM KIT) W/DEVICE KIT 1 kit by Does not apply route once.      Marland Kitchen glucagon (GLUCAGEN) 1 MG SOLR Inject 1 mg into the vein once as needed.      Marland Kitchen HYDROcodone-acetaminophen (NORCO/VICODIN) 5-325 MG per tablet Take 1-2 tablets by mouth every 4 (four) hours as needed.  60 tablet  0  . insulin glargine (LANTUS) 100 UNIT/ML injection Inject into the skin at bedtime.      . insulin lispro (HUMALOG) 100 UNIT/ML injection Inject 75-100 Units into the skin continuous. Via Insulin pump, bolus at meals      . Norgestimate-Ethinyl Estradiol Triphasic (TRINESSA, 28,) 0.18/0.215/0.25 MG-35 MCG tablet Take 1 tablet by mouth daily.      . clindamycin (CLEOCIN) 300 MG capsule Take 2 capsules (600 mg total) by mouth 2 (two) times daily.  14 capsule  0     Allergies    Allergen Reactions  . Levofloxacin Shortness Of Breath and Rash  . Moxifloxacin Shortness Of Breath and Rash  . Peach (Prunus Persica) Hives and Shortness Of Breath  . Potassium-Containing Compounds Other (See Comments)    IV ROUTE - CAUSES VEINS TO COLLAPSE  . Prednisone Other (See Comments)    SEVERE ELEVATION OF BLOOD SUGAR  . Propoxyphene-Acetaminophen Swelling    SWELLING OF FACE AND THROAT  . Rosiglitazone Maleate Swelling    SWELLING OF FACE AND LEGS  . Adhesive (Tape) Itching and Rash  . Cefaclor Rash  . Keflex (Cephalexin) Diarrhea and Rash  . Latex Hives and Itching  . Oxycodone-Acetaminophen Swelling and Rash    NORCO/VICODIN OK  . Penicillins Rash  . Promethazine Hcl Other (See Comments)    IV ROUTE ONLY - JITTERY FEELING  . Sulfadiazine Rash    BP 122/80  Pulse 95  Temp 98.6 F (37 C) (Oral)  Resp 18  Ht 5\' 5"  (1.651 m)  Wt 220 lb (99.791 kg)  BMI 36.61 kg/m2  LMP 07/01/2012  No results found.   Review of Systems  Constitutional: Negative for fever, chills and diaphoresis.  HENT: Negative for ear pain, sore throat and trouble swallowing.   Eyes: Negative for photophobia and visual disturbance.  Respiratory: Negative for cough and choking.   Cardiovascular: Negative for chest pain and palpitations.  Gastrointestinal: Negative for nausea, vomiting, abdominal pain, diarrhea, constipation, anal bleeding and rectal pain.  Genitourinary: Negative for dysuria, frequency and difficulty urinating.  Musculoskeletal: Negative for myalgias and gait problem.  Skin: Negative for color change, pallor and rash.  Neurological: Negative for dizziness, speech difficulty, weakness and numbness.  Hematological: Negative for adenopathy.  Psychiatric/Behavioral: Negative for confusion and agitation. The patient is not nervous/anxious.        Objective:   Physical Exam  Constitutional: She is oriented to person, place, and time. She appears well-developed and  well-nourished. No distress.  HENT:  Head: Normocephalic.  Mouth/Throat: Oropharynx is clear and moist. No oropharyngeal exudate.  Eyes: Conjunctivae normal and EOM are normal. Pupils are equal, round, and reactive to light. No scleral icterus.  Neck: Normal range of motion. No tracheal deviation present.  Cardiovascular: Normal rate and intact distal pulses.   Pulmonary/Chest: Effort normal. No respiratory distress. She exhibits no tenderness. Right breast exhibits no inverted nipple, no nipple discharge and no skin change. Left breast exhibits no inverted nipple, no nipple discharge and no skin change.    Abdominal: Soft. She exhibits no distension. There is no tenderness. Hernia confirmed negative in the right inguinal area and confirmed  negative in the left inguinal area.       Incisions clean with normal healing ridges.  No hernias  Genitourinary: No vaginal discharge found.  Musculoskeletal: Normal range of motion. She exhibits no tenderness.  Lymphadenopathy:       Right: No inguinal adenopathy present.       Left: No inguinal adenopathy present.  Neurological: She is alert and oriented to person, place, and time. No cranial nerve deficit. She exhibits normal muscle tone. Coordination normal.  Skin: Skin is warm and dry. No rash noted. She is not diaphoretic.  Psychiatric: She has a normal mood and affect. Her behavior is normal.       Assessment:     Large left axillary abscess.  Improved after incision and drainage.  Question of left breast fullness.    Plan:     Increase activity as tolerated to regular activity.  Do not push through pain.  Agree with mammography/ultrasound to workup left breast.  Suspected is reactionary lymph node in the setting of the axillary abscess.  If there is a persistent or concerning problem, followup with our group.  She has had a relationship with Dr. Magnus Ivan before.  She is happy to have him be her breast surgeon should need arise.  We  talked to the patient about the dangers of smoking.  We stressed that tobacco use dramatically increases the risk of peri-operative complications such as infection, tissue necrosis leaving to problems with incision/wound and organ healing, heart attack, stroke, DVT, pulmonary embolism, and death.  We noted there are programs in our community to help stop smoking.  Return to clinic p.r.n.   Instructions discussed.  Followup with primary care physician for other health issues as would normally be done.  Questions answered.  The patient expressed understanding and appreciation

## 2012-08-16 ENCOUNTER — Other Ambulatory Visit: Payer: Self-pay | Admitting: Internal Medicine

## 2012-08-16 ENCOUNTER — Other Ambulatory Visit: Payer: 59

## 2012-08-16 DIAGNOSIS — J45909 Unspecified asthma, uncomplicated: Secondary | ICD-10-CM

## 2012-08-17 ENCOUNTER — Encounter: Payer: Self-pay | Admitting: Internal Medicine

## 2012-08-17 ENCOUNTER — Ambulatory Visit (INDEPENDENT_AMBULATORY_CARE_PROVIDER_SITE_OTHER): Payer: 59 | Admitting: Internal Medicine

## 2012-08-17 VITALS — BP 116/70 | HR 96 | Resp 18 | Ht 65.0 in | Wt 198.0 lb

## 2012-08-17 DIAGNOSIS — J309 Allergic rhinitis, unspecified: Secondary | ICD-10-CM

## 2012-08-17 DIAGNOSIS — J45909 Unspecified asthma, uncomplicated: Secondary | ICD-10-CM

## 2012-08-17 DIAGNOSIS — J45998 Other asthma: Secondary | ICD-10-CM

## 2012-08-17 DIAGNOSIS — F17200 Nicotine dependence, unspecified, uncomplicated: Secondary | ICD-10-CM

## 2012-08-17 DIAGNOSIS — E109 Type 1 diabetes mellitus without complications: Secondary | ICD-10-CM

## 2012-08-17 NOTE — Assessment & Plan Note (Signed)
She has to avoid as much her she can. Nasal steroids and antihistamines are often insufficient

## 2012-08-17 NOTE — Patient Instructions (Addendum)
Order- Start Xolair application process. 300 mg every 2 weeks [place]

## 2012-08-17 NOTE — Progress Notes (Signed)
08/17/12- 30 yoF occasional social smoker followed for chronic allergic asthma, allergic rhinitis, food allergy (peaches) complicated by IDDM Works here as Lawyer. Had flu vax. Hx of seasonal exacerbations of asthma and rhinosinusitis, with triggers also including viral infections. In the past several months she has become almost unable to visit mother's home, where there is a cat-> eyes itch and swell, nasal congestion, drip and sneeze, chest tight with wheeze not controlled by LABA/ICS and multiple daily use of rescue inhaler plus occasional neb. Diabetes limits use of systemic steroids during exacerbations.  Meds tried- nasonex, dymista, Nasalcrom, fluticasone, Qnasl, otc antihistamines; Singulair, Advair, Carlos American, Dulera, Symbicort, Proair, albuterol neb. Steady use of meds has avoided ER. Today is fairly good.  Lab- IgE 08/16/12- 340 Allergy Profile Immunocap 03/12/11- Specific elevations for dust mite, cat/ dog danders, grass, weed and tree pollens. Counseled on pollen/ dust control. Office Spirometry- 08/17/12- mild obstructive disease- FVC 4.0/103%, FEV1 3.03/92%, FEV1/FVC 0.76, FEF 25-75% 2.51/65%.   ROS-see HPI Constitutional:   No-   weight loss, night sweats, fevers, chills, fatigue, lassitude. HEENT:   No-  headaches, difficulty swallowing, tooth/dental problems, sore throat,       +  sneezing, itching, ear ache, nasal congestion, post nasal drip,  CV:  No-   chest pain, orthopnea, PND, swelling in lower extremities, anasarca, dizziness, palpitations Resp: No-   shortness of breath with exertion or at rest.              No-   productive cough,  No non-productive cough,  No- coughing up of blood.              No-   change in color of mucus.  + wheezing.   Skin: No-   rash or lesions. GI:  No-   heartburn, indigestion, abdominal pain, nausea, vomiting,  GU:  MS:  No-   joint pain or swelling.   Neuro-     nothing unusual Psych:  No- change in mood or affect. No depression or anxiety.   No memory loss.  OBJ- Physical Exam General- Alert, Oriented, Affect-appropriate, Distress- none acute. Overweight Skin- rash-none, lesions- none, excoriation- none Lymphadenopathy- none Head- atraumatic            Eyes- Gross vision intact, PERRLA, conjunctivae and secretions clear            Ears- Hearing, canals-normal            Nose- Clear, no-Septal dev, mucus, polyps, erosion, perforation             Throat- Mallampati II , mucosa clear , drainage- none, tonsils- atrophic Neck- flexible , trachea midline, no stridor , thyroid nl, carotid no bruit Chest - symmetrical excursion , unlabored           Heart/CV- RRR , no murmur , no gallop  , no rub, nl s1 s2                           - JVD- none , edema- none, stasis changes- none, varices- none           Lung- clear to P&A, wheeze+minimal, cough- none , dullness-none, rub- none           Chest wall-  Abd-  Br/ Gen/ Rectal- Not done, not indicated Extrem- cyanosis- none, clubbing, none, atrophy- none, strength- nl Neuro- grossly intact to observation

## 2012-08-17 NOTE — Assessment & Plan Note (Addendum)
Active chest tightness with wheeze despite pretreatment- can't visit mother's home. Trigger may be cat, although Naesha grew up with cats. This has gotten worse despite aggressive use of maintenance controller and rescue meds.  We have discussed Xolair as an option and she would be ideal candidate.  Strongly supporting smoking cessation efforts.

## 2012-08-18 MED ORDER — OMALIZUMAB 150 MG ~~LOC~~ SOLR: 300.0000 mg | SUBCUTANEOUS | Status: DC

## 2012-08-25 ENCOUNTER — Telehealth: Payer: Self-pay | Admitting: Internal Medicine

## 2012-08-25 NOTE — Telephone Encounter (Signed)
Pt aware.

## 2012-08-26 ENCOUNTER — Other Ambulatory Visit (HOSPITAL_COMMUNITY): Payer: Self-pay | Admitting: Obstetrics and Gynecology

## 2012-08-26 DIAGNOSIS — R102 Pelvic and perineal pain: Secondary | ICD-10-CM

## 2012-08-27 ENCOUNTER — Ambulatory Visit (HOSPITAL_COMMUNITY): Admission: RE | Admit: 2012-08-27 | Payer: 59 | Source: Ambulatory Visit

## 2012-08-27 ENCOUNTER — Other Ambulatory Visit: Payer: Self-pay | Admitting: *Deleted

## 2012-08-27 ENCOUNTER — Other Ambulatory Visit: Payer: Self-pay | Admitting: Internal Medicine

## 2012-08-27 MED ORDER — OMALIZUMAB 150 MG ~~LOC~~ SOLR: 300.0000 mg | SUBCUTANEOUS | Status: DC

## 2012-08-30 ENCOUNTER — Ambulatory Visit (HOSPITAL_COMMUNITY): Admission: RE | Admit: 2012-08-30 | Payer: 59 | Source: Ambulatory Visit

## 2012-09-08 ENCOUNTER — Telehealth: Payer: Self-pay | Admitting: Internal Medicine

## 2012-09-08 MED ORDER — TRAMADOL HCL 50 MG PO TABS: ORAL_TABLET | ORAL | Status: DC

## 2012-09-08 NOTE — Telephone Encounter (Signed)
Per Dr. Maple Hudson, okay to call in Tramadol 50 mg #30. Take 1-2 every 6 hours as needed for cough. No refills. Pt is aware.

## 2012-09-20 ENCOUNTER — Telehealth: Payer: Self-pay | Admitting: Internal Medicine

## 2012-09-20 ENCOUNTER — Ambulatory Visit (INDEPENDENT_AMBULATORY_CARE_PROVIDER_SITE_OTHER)
Admission: RE | Admit: 2012-09-20 | Discharge: 2012-09-20 | Disposition: A | Discharge status: Home / Self Care | Payer: 59 | Source: Ambulatory Visit | Attending: Internal Medicine | Admitting: Internal Medicine

## 2012-09-20 ENCOUNTER — Other Ambulatory Visit: Payer: Self-pay | Admitting: Internal Medicine

## 2012-09-20 DIAGNOSIS — R059 Cough, unspecified: Secondary | ICD-10-CM

## 2012-09-20 DIAGNOSIS — R05 Cough: Secondary | ICD-10-CM

## 2012-09-20 MED ORDER — CLARITHROMYCIN 500 MG PO TABS: ORAL_TABLET | ORAL | Status: DC

## 2012-09-20 NOTE — Telephone Encounter (Signed)
At office. Respiratory infection x 2 weeks. Has finished doxy. Exam- no rhonchi but loose cough. Had pneumovax 2 yrs ago, flu vax this year. CXR today shows LLL infiltrate c/w pneumonia. Plan- Rx biaxin to [facility] pharmacy.

## 2012-09-24 ENCOUNTER — Ambulatory Visit (INDEPENDENT_AMBULATORY_CARE_PROVIDER_SITE_OTHER)
Admission: RE | Admit: 2012-09-24 | Discharge: 2012-09-24 | Disposition: A | Discharge status: Home / Self Care | Payer: 59 | Source: Ambulatory Visit | Attending: Internal Medicine | Admitting: Internal Medicine

## 2012-09-24 ENCOUNTER — Other Ambulatory Visit: Payer: Self-pay | Admitting: Internal Medicine

## 2012-09-24 ENCOUNTER — Telehealth: Payer: Self-pay | Admitting: Internal Medicine

## 2012-09-24 DIAGNOSIS — J189 Pneumonia, unspecified organism: Secondary | ICD-10-CM

## 2012-09-24 MED ORDER — AZITHROMYCIN 250 MG PO TABS: ORAL_TABLET | ORAL | Status: DC

## 2012-09-24 NOTE — Telephone Encounter (Signed)
CXR repeated today with little change in LLL pneumonia. Cough persists. Plan add Z pak to overlap with remaining biaxin

## 2012-09-29 ENCOUNTER — Other Ambulatory Visit: Payer: Self-pay | Admitting: Internal Medicine

## 2012-09-29 MED ORDER — EPINEPHRINE 0.3 MG/0.3ML IJ DEVI: 0.3000 mg | Freq: Once | INTRAMUSCULAR | Status: DC

## 2012-09-30 ENCOUNTER — Ambulatory Visit (INDEPENDENT_AMBULATORY_CARE_PROVIDER_SITE_OTHER): Payer: Self-pay | Admitting: Pharmacist

## 2012-09-30 ENCOUNTER — Encounter: Payer: Self-pay | Admitting: Pharmacist

## 2012-09-30 VITALS — Ht 65.5 in | Wt 216.0 lb

## 2012-09-30 DIAGNOSIS — J45909 Unspecified asthma, uncomplicated: Secondary | ICD-10-CM

## 2012-09-30 DIAGNOSIS — J3089 Other allergic rhinitis: Secondary | ICD-10-CM

## 2012-09-30 DIAGNOSIS — J45998 Other asthma: Secondary | ICD-10-CM

## 2012-09-30 DIAGNOSIS — F17200 Nicotine dependence, unspecified, uncomplicated: Secondary | ICD-10-CM

## 2012-09-30 DIAGNOSIS — J302 Other seasonal allergic rhinitis: Secondary | ICD-10-CM

## 2012-09-30 DIAGNOSIS — J309 Allergic rhinitis, unspecified: Secondary | ICD-10-CM

## 2012-09-30 DIAGNOSIS — E109 Type 1 diabetes mellitus without complications: Secondary | ICD-10-CM

## 2012-09-30 NOTE — Assessment & Plan Note (Signed)
Patient planning to quit smoking with use of nicotine patches (14mg ) in the next few days.  Currently working with Baron Hamper from outpatient Pharmacy to help with treatment plan.  Encouraged her to quit completely.

## 2012-09-30 NOTE — Assessment & Plan Note (Addendum)
  Following medication review, no suggestions for change.  Patient knowledgeable about new medication - Xolair. Complete medication list provided to patient.  Total time in face to face medication review: 20 minutes.  Patient seen with: Doris Cheadle PharmD, Pharmacy Resident.

## 2012-09-30 NOTE — Assessment & Plan Note (Signed)
Following medication review, no suggestions for change.  Patient knowledgeable about new medication - Xolair. Complete medication list provided to patient.  Total time in face to face medication review: 20 minutes.  Patient seen with: Doris Cheadle PharmD, Pharmacy Resident.

## 2012-09-30 NOTE — Patient Instructions (Addendum)
Thank you for coming in today.  Good luck quitting smoking!

## 2012-09-30 NOTE — Progress Notes (Signed)
  Subjective:    Patient ID: [name], female    DOB: [birth year], 32 y.o.   MRN: [id number]  HPI Patient arrives in good spirits for medication review.   Reports seeing Laurann Montana as primary care provider at [facility], Dr. Napoleon Form as endocrinologist at [facility], Dr. Fannie Knee as pulmonology/allergy, and Dr. Tracey Harries as OB/GYN at [facility].  Reports being diagnosed with asthma since age 83 or 5 and states this is currently initiating Xolair.    Currently smoking up to 6 cigs per day of Pal Mal Blue - Lights.    Review of Systems     Objective:   Physical Exam        Assessment & Plan:  Patient planning to quit smoking with use of nicotine patches (14mg ) in the next few days.  Currently working with Baron Hamper from outpatient Pharmacy to help with treatment plan.  Encouraged her to quit completely.    Following medication review, no suggestions for change.  Patient knowledgeable about new medication - Xolair. Complete medication list provided to patient.  Total time in face to face medication review: 20 minutes.  Patient seen with: Doris Cheadle PharmD, Pharmacy Resident.

## 2012-10-01 NOTE — Progress Notes (Signed)
Patient ID: [name], female   DOB: 03-24-1981, 32 y.o.   MRN: [id number] Reviewed: Agree with Dr. Macky Lower documentation and management.

## 2012-10-12 ENCOUNTER — Ambulatory Visit (INDEPENDENT_AMBULATORY_CARE_PROVIDER_SITE_OTHER): Payer: 59

## 2012-10-12 DIAGNOSIS — J45909 Unspecified asthma, uncomplicated: Secondary | ICD-10-CM

## 2012-10-13 MED ORDER — OMALIZUMAB 150 MG ~~LOC~~ SOLR
300.0000 mg | Freq: Once | SUBCUTANEOUS | Status: AC
  Administered 2012-10-13: 300 mg via SUBCUTANEOUS

## 2012-10-26 ENCOUNTER — Ambulatory Visit: Payer: 59

## 2012-10-28 ENCOUNTER — Ambulatory Visit (INDEPENDENT_AMBULATORY_CARE_PROVIDER_SITE_OTHER): Payer: 59

## 2012-10-28 DIAGNOSIS — J45909 Unspecified asthma, uncomplicated: Secondary | ICD-10-CM

## 2012-10-29 MED ORDER — OMALIZUMAB 150 MG ~~LOC~~ SOLR
150.0000 mg | Freq: Once | SUBCUTANEOUS | Status: DC
  Administered 2012-10-29: 150 mg via SUBCUTANEOUS

## 2012-10-29 MED ORDER — OMALIZUMAB 150 MG ~~LOC~~ SOLR
300.0000 mg | Freq: Once | SUBCUTANEOUS | Status: AC
  Administered 2012-10-29: 300 mg via SUBCUTANEOUS

## 2012-11-06 ENCOUNTER — Other Ambulatory Visit: Payer: Self-pay

## 2012-11-11 ENCOUNTER — Ambulatory Visit: Payer: 59

## 2012-11-25 ENCOUNTER — Other Ambulatory Visit: Payer: 59

## 2012-11-25 ENCOUNTER — Ambulatory Visit (INDEPENDENT_AMBULATORY_CARE_PROVIDER_SITE_OTHER): Payer: 59

## 2012-11-25 ENCOUNTER — Other Ambulatory Visit: Payer: Self-pay | Admitting: Internal Medicine

## 2012-11-25 DIAGNOSIS — E559 Vitamin D deficiency, unspecified: Secondary | ICD-10-CM

## 2012-11-25 DIAGNOSIS — J45909 Unspecified asthma, uncomplicated: Secondary | ICD-10-CM

## 2012-11-26 ENCOUNTER — Ambulatory Visit: Payer: 59

## 2012-11-30 LAB — VITAMIN D 1,25 DIHYDROXY
Vitamin D2 1, 25 (OH)2: 10 pg/mL
Vitamin D3 1, 25 (OH)2: 47 pg/mL

## 2012-12-02 NOTE — Progress Notes (Signed)
Quick Note:  Aware of results and sent to PCP. ______

## 2012-12-06 ENCOUNTER — Ambulatory Visit (INDEPENDENT_AMBULATORY_CARE_PROVIDER_SITE_OTHER): Payer: Self-pay | Admitting: Family Medicine

## 2012-12-06 VITALS — BP 128/83 | HR 102 | Ht 65.0 in | Wt 213.6 lb

## 2012-12-06 DIAGNOSIS — E119 Type 2 diabetes mellitus without complications: Secondary | ICD-10-CM

## 2012-12-06 MED ORDER — OMALIZUMAB 150 MG ~~LOC~~ SOLR
300.0000 mg | Freq: Once | SUBCUTANEOUS | Status: AC
  Administered 2012-12-06: 300 mg via SUBCUTANEOUS

## 2012-12-06 NOTE — Progress Notes (Signed)
Patient is here for a 2 month follow up visit for smoking cessation and DM1.   Subjective:  Pt is here for follow up for DM and smoking cessation. Last A1c 9.9% at last pharmacy visit. Has been checking sugars 3x/day. Highest reported sugar 267, lowest 77. She is still using the nicotine patches (14 mg) daily. She has stopped smoking for about a month and a half but reports she had a slip up this past weekend. She thinks this was due to drinking.   Diabetes Assessment:  Diabetes: Type of Diabetes: Type 1; checks feet daily; uses glucometer; takes medications as prescribed; does not take an aspirin a day; Year of diagnosis 1999; Sees Diabetes provider 3 times per year;   checks blood glucose 2 times a day; hypoglycemia frequency rarely; MD managing Diabetes Dr. Talmage Nap;  Highest CBG 267; Lowest CBG 77;   Other Diabetes History: Checking BG 3 times daily - around breakfast, lunch, and dinner; as needed if feeling low as well Did not bring meter with her- high and low were patient reported.   Dr. Talmage Nap changed pump settings as last visit.  Morning fasting blood sugars continue to range from 170-220 lately. Lunchtime is generally lower ranging from 77-140. She thinks this is because she is up and moving more in the mornings. Dinnertime is generally back up, ranging from 180-220. She thinks this is because she's not as active in the afternoons.  Lifestyle Changes:  Exercise adherence 5 days or more days a week continues walking and home exercise videos.  Physical Activity- walking/work out video 30 minutes 5 days/week.   Nutrition-  Breakfast - eggs, [place] bacon, 2 pieces wheat toast snack - cheese, [place] breast lunch - salad with no meat, w/ ranch snack - apple dinner - pepperoni piece (splurge, normally baked chicken, meatloaf from [place] meat, cabbage, green beans, etc) Reduced bread and other carbs significantly.  Preventive Care:    Hemoglobin A1c: 10/11/2012; 9.9%   Dilated Eye  Exam: 12/22/2011  Flu vaccine: 07/06/2012  Foot Exam: 12/01/2012  Other Preventive Care Notes: dentist last visit - December 2013  Last Endocrinologist Visit 12/01/2012     Vital Signs:  12/06/2012 4:08 PM (EST) Blood Pressure 128 / 83 mm/HgBMI 35.5; Height 5 ft 5 in; Pulse Rate 102 bpm; Weight 213.6 lbs   Assessment:  Diabetes: Since seeing Dr. Talmage Nap, she has improved on checking her blood sugars regularly. Blood sugar control varies throughout day with varying activity levels. Last week, Dr. Talmage Nap adjusted her pump settings to try to accomodate this. Diet and excercise adherence has been great. She maintained her weight since last visit even with successful smoking cessation. She is getting frustrated with a plateau in her weight loss, trying to increase to 1hr exercise to overcome it.   Smoking cessation: She has been very successful with not smoking for the last month and a half. Her one slip up resulted in her feeling sick, which can be incentive not to smoke again. She seems very motivated to continue with not smoking. Patient seen with Doris Cheadle, PharmD.   Will follow up with patient in 2 months.   Aashrith Eves D. Vivia Ewing, PharmD, BCPS

## 2012-12-15 ENCOUNTER — Ambulatory Visit (INDEPENDENT_AMBULATORY_CARE_PROVIDER_SITE_OTHER): Payer: 59

## 2012-12-15 DIAGNOSIS — J45909 Unspecified asthma, uncomplicated: Secondary | ICD-10-CM

## 2012-12-16 MED ORDER — OMALIZUMAB 150 MG ~~LOC~~ SOLR
300.0000 mg | Freq: Once | SUBCUTANEOUS | Status: AC
  Administered 2012-12-16: 300 mg via SUBCUTANEOUS

## 2012-12-18 ENCOUNTER — Encounter (HOSPITAL_COMMUNITY): Payer: Self-pay | Admitting: Cardiology

## 2012-12-18 ENCOUNTER — Emergency Department (HOSPITAL_COMMUNITY)
Admission: EM | Admit: 2012-12-18 | Discharge: 2012-12-18 | Disposition: A | Discharge status: Nursing Home (Includes ICF) | Payer: 59 | Source: Home / Self Care

## 2012-12-18 ENCOUNTER — Encounter (HOSPITAL_COMMUNITY): Payer: Self-pay | Admitting: Emergency Medicine

## 2012-12-18 ENCOUNTER — Emergency Department (HOSPITAL_COMMUNITY)
Admission: EM | Admit: 2012-12-18 | Discharge: 2012-12-18 | Disposition: A | Discharge status: Home / Self Care | Payer: 59 | Attending: Emergency Medicine | Admitting: Emergency Medicine

## 2012-12-18 DIAGNOSIS — Z79899 Other long term (current) drug therapy: Secondary | ICD-10-CM | POA: Insufficient documentation

## 2012-12-18 DIAGNOSIS — Z8659 Personal history of other mental and behavioral disorders: Secondary | ICD-10-CM | POA: Insufficient documentation

## 2012-12-18 DIAGNOSIS — I10 Essential (primary) hypertension: Secondary | ICD-10-CM | POA: Insufficient documentation

## 2012-12-18 DIAGNOSIS — Z8719 Personal history of other diseases of the digestive system: Secondary | ICD-10-CM | POA: Insufficient documentation

## 2012-12-18 DIAGNOSIS — Z794 Long term (current) use of insulin: Secondary | ICD-10-CM | POA: Insufficient documentation

## 2012-12-18 DIAGNOSIS — Z872 Personal history of diseases of the skin and subcutaneous tissue: Secondary | ICD-10-CM | POA: Insufficient documentation

## 2012-12-18 DIAGNOSIS — Z8614 Personal history of Methicillin resistant Staphylococcus aureus infection: Secondary | ICD-10-CM | POA: Insufficient documentation

## 2012-12-18 DIAGNOSIS — Z8639 Personal history of other endocrine, nutritional and metabolic disease: Secondary | ICD-10-CM | POA: Insufficient documentation

## 2012-12-18 DIAGNOSIS — T782XXA Anaphylactic shock, unspecified, initial encounter: Secondary | ICD-10-CM | POA: Insufficient documentation

## 2012-12-18 DIAGNOSIS — Z8679 Personal history of other diseases of the circulatory system: Secondary | ICD-10-CM | POA: Insufficient documentation

## 2012-12-18 DIAGNOSIS — R061 Stridor: Secondary | ICD-10-CM | POA: Insufficient documentation

## 2012-12-18 DIAGNOSIS — Z862 Personal history of diseases of the blood and blood-forming organs and certain disorders involving the immune mechanism: Secondary | ICD-10-CM | POA: Insufficient documentation

## 2012-12-18 DIAGNOSIS — J45909 Unspecified asthma, uncomplicated: Secondary | ICD-10-CM | POA: Insufficient documentation

## 2012-12-18 DIAGNOSIS — E119 Type 2 diabetes mellitus without complications: Secondary | ICD-10-CM | POA: Insufficient documentation

## 2012-12-18 DIAGNOSIS — L299 Pruritus, unspecified: Secondary | ICD-10-CM | POA: Insufficient documentation

## 2012-12-18 DIAGNOSIS — T7840XA Allergy, unspecified, initial encounter: Secondary | ICD-10-CM

## 2012-12-18 DIAGNOSIS — R21 Rash and other nonspecific skin eruption: Secondary | ICD-10-CM | POA: Insufficient documentation

## 2012-12-18 DIAGNOSIS — Z87891 Personal history of nicotine dependence: Secondary | ICD-10-CM | POA: Insufficient documentation

## 2012-12-18 LAB — GLUCOSE, CAPILLARY: Glucose-Capillary: 251 mg/dL — ABNORMAL HIGH (ref 70–99)

## 2012-12-18 MED ORDER — METHYLPREDNISOLONE SODIUM SUCC 125 MG IJ SOLR
125.0000 mg | Freq: Once | INTRAMUSCULAR | Status: AC
  Administered 2012-12-18: 125 mg via INTRAMUSCULAR

## 2012-12-18 MED ORDER — EPINEPHRINE 0.15 MG/0.3ML IJ DEVI
INTRAMUSCULAR | Status: AC
  Administered 2012-12-18: 0.15 mg via INTRAMUSCULAR
  Filled 2012-12-18: qty 0.3

## 2012-12-18 MED ORDER — DIPHENHYDRAMINE HCL 50 MG/ML IJ SOLN: 50.0000 mg | Freq: Once | INTRAMUSCULAR | Status: AC

## 2012-12-18 MED ORDER — DIPHENHYDRAMINE HCL 50 MG/ML IJ SOLN
50.0000 mg | Freq: Once | INTRAMUSCULAR | Status: AC
  Administered 2012-12-18: 50 mg via INTRAMUSCULAR

## 2012-12-18 MED ORDER — EPINEPHRINE 0.15 MG/0.3ML IJ DEVI: 0.1500 mg | Freq: Once | INTRAMUSCULAR | Status: AC

## 2012-12-18 MED ORDER — METHYLPREDNISOLONE SODIUM SUCC 125 MG IJ SOLR: 125.0000 mg | Freq: Once | INTRAMUSCULAR | Status: DC

## 2012-12-18 MED ORDER — FAMOTIDINE 20 MG PO TABS: 20.0000 mg | ORAL_TABLET | Freq: Two times a day (BID) | ORAL | Status: DC

## 2012-12-18 MED ORDER — PREDNISONE 10 MG PO TABS: 60.0000 mg | ORAL_TABLET | Freq: Every day | ORAL | Status: DC

## 2012-12-18 MED ORDER — FAMOTIDINE 40 MG PO TABS: 40.0000 mg | ORAL_TABLET | Freq: Once | ORAL | Status: DC

## 2012-12-18 MED ORDER — DIPHENHYDRAMINE HCL 25 MG PO TABS: 50.0000 mg | ORAL_TABLET | Freq: Four times a day (QID) | ORAL | Status: DC | PRN

## 2012-12-18 MED ORDER — FAMOTIDINE IN NACL 20-0.9 MG/50ML-% IV SOLN
20.0000 mg | Freq: Once | INTRAVENOUS | Status: AC
  Administered 2012-12-18: 20 mg via INTRAVENOUS
  Filled 2012-12-18: qty 50

## 2012-12-18 MED ORDER — DIPHENHYDRAMINE HCL 50 MG/ML IJ SOLN
INTRAMUSCULAR | Status: AC
  Administered 2012-12-18: 50 mg via INTRAVENOUS
  Filled 2012-12-18: qty 1

## 2012-12-18 MED ORDER — HYDROCORTISONE 1 % EX CREA
TOPICAL_CREAM | Freq: Once | CUTANEOUS | Status: AC
  Administered 2012-12-18: 17:00:00 via TOPICAL
  Filled 2012-12-18: qty 28

## 2012-12-18 MED ORDER — EPINEPHRINE 0.15 MG/0.3ML IJ DEVI
INTRAMUSCULAR | Status: AC
  Administered 2012-12-18: 0.15 mg
  Filled 2012-12-18: qty 0.3

## 2012-12-18 MED ORDER — EPINEPHRINE 0.3 MG/0.3ML IJ DEVI: 0.3000 mg | INTRAMUSCULAR | Status: DC | PRN

## 2012-12-18 MED ORDER — METHYLPREDNISOLONE SODIUM SUCC 125 MG IJ SOLR
INTRAMUSCULAR | Status: AC
  Filled 2012-12-18: qty 2

## 2012-12-18 MED ORDER — DIPHENHYDRAMINE HCL 50 MG/ML IJ SOLN
INTRAMUSCULAR | Status: AC
  Filled 2012-12-18: qty 1

## 2012-12-18 NOTE — ED Provider Notes (Signed)
History     CSN: [id number]  Arrival date & time 12/18/12  1404   First MD Initiated Contact with Patient 12/18/12 1411      Chief Complaint  Patient presents with  . Respiratory Distress    The history is provided by medical records and the patient. History limited by: Level V caveat: Rest for a stress.   The patient reports a rash to left upper extremity over the past several days and now is morning developed worsening rash and itching and presented to urgent care.  On arrival at urgent care she began having more difficulty breathing and began having stridor.  She was given Solu-Medrol and Benadryl and sent down the emergency department.  The patient on arrival emergency apartment cannot speak secondary to severe respiratory distress and stridor.  She's tolerating secretions.  No prior history of anaphylaxis.   Past Medical History  Diagnosis Date  . Hx MRSA infection   . Migraine   . Vitamin D insufficiency   . Depression   . Asthma     as a child  . GERD (gastroesophageal reflux disease)     no current meds.  . Hidradenitis 04/2012    bilat. thighs, left groin - open areas on thighs  . Diabetes mellitus     IDDM, Insulin pump; followed by Dr. Talmage Coin  . Abscess of left axilla - PREVOTELLA BIVIA & Staph Coag Neg 07/12/2012    MODERATE PREVOTELLA BIVIA Note: BETA LACTAMASE NEGATIVE      Past Surgical History  Procedure Laterality Date  . Wisdom tooth extraction    . Tonsillectomy    . Bunionectomy      left  . Esophagogastroduodenoscopy  11/17/2011    Procedure: ESOPHAGOGASTRODUODENOSCOPY (EGD);  Surgeon: Shirley Friar, MD;  Location: [facility];  Service: Endoscopy;  Laterality: N/A;  . Eye surgery      exc. stye left eye  . Breast surgery      right lumpectomy  . Cholecystectomy  12/02/2006    lap. chole.  . Dilation and evacuation  02/14/2007  . Hydradenitis excision  04/30/2012    Procedure: EXCISION HYDRADENITIS GROIN;  Surgeon: Shelly Rubenstein,  MD;  Location: [facility];  Service: General;  Laterality: Left;  wide excision hidradenitis bilateral thighs and Left groin    History reviewed. No pertinent family history.  History  Substance Use Topics  . Smoking status: Former Smoker -- 1.00 packs/day for 14 years    Types: Cigarettes  . Smokeless tobacco: Never Used     Comment: 5 cig./day  . Alcohol Use: No    OB History   Grav Para Term Preterm Abortions TAB SAB Ect Mult Living   4    3  3   1       Review of Systems  Unable to perform ROS: Severe respiratory distress    Allergies  Latex; Levofloxacin; Moxifloxacin; Peach; Penicillins; Propoxyphene-acetaminophen; Rosiglitazone maleate; Adhesive; Potassium-containing compounds; Prednisone; Cefaclor; Keflex; Oxycodone-acetaminophen; Promethazine hcl; and Sulfadiazine  Home Medications   Current Outpatient Rx  Name  Route  Sig  Dispense  Refill  . EPINEPHrine (EPI-PEN) 0.3 mg/0.3 mL DEVI   Intramuscular   Inject 0.3 mLs (0.3 mg total) into the muscle once.   1 Device   prn   . glucagon (GLUCAGEN) 1 MG SOLR   Intravenous   Inject 1 mg into the vein once as needed.         Marland Kitchen glucose blood (ONE TOUCH TEST  STRIPS) test strip      Quantity sufficient for up to 4x daily testing.         . insulin glargine (LANTUS) 100 UNIT/ML injection   Subcutaneous   Inject into the skin at bedtime.         . insulin lispro (HUMALOG) 100 UNIT/ML injection   Subcutaneous   Inject 75-100 Units into the skin continuous. Via Insulin pump, bolus at meals         . nicotine (NICODERM CQ - DOSED IN MG/24 HOURS) 14 mg/24hr patch   Transdermal   Place 1 patch onto the skin daily.         Marland Kitchen omalizumab (XOLAIR) 150 MG injection   Subcutaneous   Inject 300 mg into the skin every 14 (fourteen) days.   2 each   6     BP 145/83  Pulse 117  Temp(Src) 99.4 F (37.4 C) (Oral)  Resp 40  SpO2 100%  LMP 11/23/2012  Physical Exam  Nursing note and vitals  reviewed. Constitutional: She is oriented to person, place, and time. She appears well-developed and well-nourished. No distress.  HENT:  Head: Normocephalic and atraumatic.  UA patent.  Uvula midline.  Tolerating secretions.  No posterior pharyngeal or tongue swelling.  No lip swelling.  Eyes: EOM are normal.  Neck: Normal range of motion.  Cardiovascular: Regular rhythm and normal heart sounds.   Tachycardia  Pulmonary/Chest: Stridor present. She is in respiratory distress. She has no wheezes.  Abdominal: Soft. She exhibits no distension. There is no tenderness.  Musculoskeletal: Normal range of motion.  Neurological: She is alert and oriented to person, place, and time.  Skin: Skin is warm and dry. Rash noted.  Urticaria,  Redness of face and chest  Psychiatric: She has a normal mood and affect. Judgment normal.    ED Course  Procedures (including critical care time)  Labs Reviewed - No data to display No results found.   1. Anaphylaxis, initial encounter       MDM   2:21 PM Noted that EpiPen Montez Hageman was given.  An additional EpiPen Montez Hageman will be given for a complete adult dose.  Patient seems to be improving at this time.  Her stridor is improving.  RS 106.  No hypoxia.  Respiratory rate 35.  The patient does report that her symptoms are improving.  5:57 PM I checked back on the patient several times during her ER stay.  Now it's been 3-1/2 hours since epinephrine.  She continues to do well.  No stridor.  No longer itching.  She feels much better.  Discharge home in good condition.  Home with steroids, Benadryl, Pepcid, EpiPen.  She's no longer tachycardic.  Vital signs will need to be updated       Lyanne Co, MD 12/18/12 1800

## 2012-12-18 NOTE — ED Notes (Signed)
Pt ambulatory to bathroom with no assistance.  Pt returned to room in no distress and no complaints.  Pt now resting comfortably

## 2012-12-18 NOTE — ED Notes (Signed)
Pt reports a rash to the arms chest and legs that she noticed this morning. States she was seen at [facility] and given solu-medrol. Pt with respiratory difficulty and states her tongue feels swollen. Pt with expiratory wheezing.

## 2012-12-18 NOTE — ED Provider Notes (Signed)
History     CSN: [id number]  Arrival date & time 12/18/12  1247   First MD Initiated Contact with Patient 12/18/12 1249      Chief Complaint  Patient presents with  . Allergic Reaction    rash on arms yesterday. tongue itching, hives /swelling today.    (Consider location/radiation/quality/duration/timing/severity/associated sxs/prior treatment) Patient is a 32 y.o. female presenting with allergic reaction.  Allergic Reaction The primary symptoms are  cough and rash. The primary symptoms do not include wheezing or shortness of breath.   This is a 32 year old female who presents with a rash swelling of her, and throat and cough. The patient states that the rash started yesterday after she had [place] food. Interestingly it started around the area of where she had recently gotten a tattoo. It progressed up her left arm steadily and today she noticed that her face also developed the same rash. While waiting in urgent care she developed a cough and swelling of her throat and tongue. She has not had any wheeze or shortness of breath. She has never had an allergic reaction in the past.  Past Medical History  Diagnosis Date  . Hx MRSA infection   . Migraine   . Vitamin D insufficiency   . Depression   . Asthma     as a child  . GERD (gastroesophageal reflux disease)     no current meds.  . Hidradenitis 04/2012    bilat. thighs, left groin - open areas on thighs  . Diabetes mellitus     IDDM, Insulin pump; followed by Dr. Talmage Coin  . Abscess of left axilla - PREVOTELLA BIVIA & Staph Coag Neg 07/12/2012    MODERATE PREVOTELLA BIVIA Note: BETA LACTAMASE NEGATIVE      Past Surgical History  Procedure Laterality Date  . Wisdom tooth extraction    . Tonsillectomy    . Bunionectomy      left  . Esophagogastroduodenoscopy  11/17/2011    Procedure: ESOPHAGOGASTRODUODENOSCOPY (EGD);  Surgeon: Shirley Friar, MD;  Location: [facility];  Service: Endoscopy;  Laterality: N/A;  .  Eye surgery      exc. stye left eye  . Breast surgery      right lumpectomy  . Cholecystectomy  12/02/2006    lap. chole.  . Dilation and evacuation  02/14/2007  . Hydradenitis excision  04/30/2012    Procedure: EXCISION HYDRADENITIS GROIN;  Surgeon: Shelly Rubenstein, MD;  Location: [facility];  Service: General;  Laterality: Left;  wide excision hidradenitis bilateral thighs and Left groin    History reviewed. No pertinent family history.  History  Substance Use Topics  . Smoking status: Former Smoker -- 1.00 packs/day for 14 years    Types: Cigarettes  . Smokeless tobacco: Never Used     Comment: 5 cig./day  . Alcohol Use: No    OB History   Grav Para Term Preterm Abortions TAB SAB Ect Mult Living   4    3  3   1       Review of Systems  Constitutional: Negative.   Eyes: Negative.   Respiratory: Positive for cough, choking and stridor. Negative for apnea, chest tightness, shortness of breath and wheezing.   Cardiovascular: Negative.   Gastrointestinal: Negative.   Genitourinary: Negative.   Musculoskeletal: Negative.   Skin: Positive for rash.  Neurological: Negative.   Hematological: Negative.   Psychiatric/Behavioral: Negative.     Allergies  Latex; Levofloxacin; Moxifloxacin; Peach; Penicillins; Propoxyphene-acetaminophen; Rosiglitazone maleate;  Adhesive; Potassium-containing compounds; Prednisone; Cefaclor; Keflex; Oxycodone-acetaminophen; Promethazine hcl; and Sulfadiazine  Home Medications   Current Outpatient Rx  Name  Route  Sig  Dispense  Refill  . insulin lispro (HUMALOG) 100 UNIT/ML injection   Subcutaneous   Inject 75-100 Units into the skin continuous. Via Insulin pump, bolus at meals         . omalizumab (XOLAIR) 150 MG injection   Subcutaneous   Inject 300 mg into the skin every 14 (fourteen) days.   2 each   6   . EPINEPHrine (EPI-PEN) 0.3 mg/0.3 mL DEVI   Intramuscular   Inject 0.3 mLs (0.3 mg total) into the muscle once.    1 Device   prn   . glucagon (GLUCAGEN) 1 MG SOLR   Intravenous   Inject 1 mg into the vein once as needed.         Marland Kitchen glucose blood (ONE TOUCH TEST STRIPS) test strip      Quantity sufficient for up to 4x daily testing.         . insulin glargine (LANTUS) 100 UNIT/ML injection   Subcutaneous   Inject into the skin at bedtime.         . nicotine (NICODERM CQ - DOSED IN MG/24 HOURS) 14 mg/24hr patch   Transdermal   Place 1 patch onto the skin daily.           BP 119/104  Pulse 119  Temp(Src) 98.6 F (37 C) (Oral)  Resp 32  SpO2 100%  LMP 11/23/2012  Physical Exam  Constitutional: She is oriented to person, place, and time. She appears well-developed and well-nourished.  HENT:  Head: Normocephalic and atraumatic.  Mouth/Throat: No oropharyngeal exudate.  Eyes: Conjunctivae and EOM are normal. Pupils are equal, round, and reactive to light.  Neck: Normal range of motion. Neck supple.  Cardiovascular: Normal rate and regular rhythm.   Pulmonary/Chest: Effort normal and breath sounds normal.  Intermittent cough. Stridor.  Abdominal: Soft. Bowel sounds are normal.  Musculoskeletal: Normal range of motion.  Neurological: She is alert and oriented to person, place, and time.  Skin: Skin is warm and dry.  Psychiatric: Her behavior is normal.    ED Course  Procedures (including critical care time)  Labs Reviewed - No data to display No results found.   1. Allergic reaction, initial encounter       MDM  Patient has been given IM Solu-Medrol 125 mg and IM Benadryl 50 mg. Although her vitals are quite stable, her stridor is not resolving and therefore I would recommend transfer to ER for racemic Epi and further monitoring.         Calvert Cantor, MD 12/18/12 1348

## 2012-12-18 NOTE — ED Notes (Signed)
Pt came to room in obvious respiratory distress. After epi, benadryl, pt is improving. Pt states rash started on left arm yesterday, today progressed to chest and face and then began developing SOB. Pt able to talk in short sentences, o2 sats 100% on 4L o2 via [place]. Exp wheezing auscultated.

## 2012-12-18 NOTE — ED Notes (Signed)
Pt reports still having some itching.  Rash noted to left arm, chest and face.

## 2012-12-18 NOTE — ED Notes (Signed)
Pt given discharge paperwork.  Pt verbalized understanding of d/c and f/u.  No additional questions by pt regarding d/c.  VSS. Resps e/u.  Nad.  E-signature obtained.

## 2012-12-18 NOTE — ED Notes (Signed)
Pt reports allergic reaction rash on arms. Tongue itching and swelling. Hives. Sob. Cough x today.   Pt has used benedryl with no relief.

## 2012-12-20 ENCOUNTER — Telehealth: Payer: Self-pay | Admitting: Internal Medicine

## 2012-12-20 NOTE — Telephone Encounter (Signed)
Last Xolair injection 12/15/12. On 12/18/12 began noting pruritic, punctate erythematous rash which continues to spread, generalized. On 3/29 was starting antihistamines beyond benadryl but got dyspneic and syncopal/ anaphyllaxis Rx'd ER. Now on prednisone 60 mg, benadryl, zyrtec, pepcid. Not wheezing. Ambulatory. Flushed and pruritic. Imp- Suspect Xolair reaction. Her only other med is Humalog. Plan D/C Xolair

## 2012-12-21 NOTE — Progress Notes (Signed)
Patient ID: [name], female   DOB: 05/24/1981, 31 y.o.   MRN: [id number] ATTENDING PHYSICIAN NOTE: I have reviewed the chart and agree with the plan as detailed above. Bernadette Gores MD Pager [phone]  

## 2012-12-22 ENCOUNTER — Other Ambulatory Visit: Payer: Self-pay | Admitting: Internal Medicine

## 2012-12-22 MED ORDER — HYDROXYZINE HCL 10 MG PO TABS: 10.0000 mg | ORAL_TABLET | Freq: Three times a day (TID) | ORAL | Status: DC | PRN

## 2012-12-29 ENCOUNTER — Ambulatory Visit: Payer: 59

## 2013-01-24 ENCOUNTER — Ambulatory Visit (INDEPENDENT_AMBULATORY_CARE_PROVIDER_SITE_OTHER): Payer: 59 | Admitting: Family Medicine

## 2013-01-24 VITALS — BP 115/77 | HR 115 | Ht 65.0 in | Wt 216.2 lb

## 2013-01-24 DIAGNOSIS — E119 Type 2 diabetes mellitus without complications: Secondary | ICD-10-CM

## 2013-01-24 NOTE — Progress Notes (Signed)
Established Patient - Follow-Up Evaluation  [facility] Consult - Clinical Pharmacist  Care Team Member: Gerre Pebbles     Subjective:  Patient is here for a 6 week follow up appointment for diabetes. Her last appointment with Dr. Talmage Nap was immediately prior to her appointment with me in March.   She recently had an ED admission for an allergic reaction to Xolair. Besides the d/c of Xolair, no other medication changes.   She has started smoking again and is only smoking 1-2 cigs daily. She has the 14 mg patch but is not wearing it. She is thinking about going back to see her therapist. She thinks that she can start wearing them again.    Disease Assessments:  Diabetes:  Type of Diabetes: Type 1; checks feet daily; uses glucometer; takes medications as prescribed; does not take an aspirin a day; Year of diagnosis 1999; Sees Diabetes provider 3 times per year; checks blood glucose 0-1 times a day; MD managing Diabetes Dr. Talmage Nap;  7 day CBG average 198; Highest CBG 284; Lowest CBG 91; hypoglycemia frequency rarely;   Other Diabetes History: She has not been checking her blood sugar as much lately. She is trying for twice a day to check and states that she has been doing better with checking. Last week she was not checking at all. She had lost the meter that communicated with her pump, but she found it.  The meter she brought today only has 6 readings on it.   She states that checking her blood sugar is a big struggle for her.    Tobacco Assessment:  Smoking Status: Current every day smoker After her anaphylactic reaction to Xolair she has started smoking again- 1 or 2 cigarettes daily. She has the patches but has not started wearing them yet.; Last Reviewed: 01/21/2013; Counseling on tobacco cessation; Rx therapy for tobacco cessation; Discussed Smoking/Tobacco Use Cessation Strategies; Tobacco User: Yes  Pack-years: 14; smoked for 14; Date quit smoking: 09/10/2011    Social History:  Exercise  adherence 5 days or more days a week continues walking and home exercise videos; Diet adherence 50-75% of the time  ; Denies alcohol use; Social work consult was not done; Denies drug use;   Exercise habits: 90 minutes.; 150 minutes of exercise per week; Caffeine use: cola 4 per dayDiet Mtn Dew; Patient can afford medications; Patient knows the purpose/use of medications.  Occupation: CMA- [employer]  Physical Activity- walking and playing outside with her daughter.   Nutrition- no big changes since last appointment.     Testing:  Blood Sugar Tests:  Hemoglobin A1c: 11.2 resulted on 01/21/2013     Assessment/Plan: Patient is a 32 year old female with DM1. A1C today was 11.2% and is not meeting a glycemic goal of less than 6.5%. Patient seems discouraged and states that many of her coworkers today gave her a stern talking to about taking better care of herself. She seems overwhelmed with her diagnosis and life in general. She recently was admitted to the ED following an anaphylactic reaction to Xolair. She states that this has put some things into perspective and she needs to take care of herself so that she can take care of her 16 year old daughter McKenzie.   The time in our appointment was limited today because patient had to go and pick up her daughter from daycare. Patient is not currently checking her blood glucose consistently. The meter she brought to the appointment today only has 6 readings  on it. She continues with the fast acting insulin in her pump and is bolusing with each meal. She is not correcting for any elevated CBG readings as she is not checking her blood glucose.   I spent the majority of the visit today discussing with patient what her goals and aims are in treating diabetes and what her life long goals and expectations are in relation to her diagnosis. Patient is a single parent and has custody of her 40 year old daughter. She states that she wants to be able to take care of  McKenzie because no one else will be able to do this. I encouraged her to take the time to manage her blood sugar because that will allow her to be healthy enough to care for McKenzie. Patient voiced that she had been thinking about seeing her therapist again.   I asked patient to start checking her blood sugar at least once daily. She agreed that in order to stay active with this program she will need to check in with me once a week via email and send me her meter readings. I'm working with the rep from [employer] to see if there is a way that she can download the information from her meter and email it to me. I gave the patient the incentive that if she can send me her CBGs each week she can see me in 8 weeks instead of 4 weeks.   Patient also agreed to start wearing the Nicotine 14 mg patches again and to stop smoking. She had been cigarette free for several weeks prior to her recent ED admission and recently started picking up a cigarette again.   Follow up with patient in 1 week. These email follow up visits will be documented in the [employer]. She will follow up with me in person either in 4 weeks or 8 weeks depending on her progress..    Goals for Next Visit-  1. at 7:15 PM tonight, check to see if your meter gives you a reminder to check your blood sugar.  2. Start wearing the Nicotine patches again and tomorrow, no more cigarettes. Wear the patch daily.  3. Start attaching your blood glucose monitor pouch to your handbag.  4. Send me your blood sugar readings each week by Friday each week. If you do not continue to send me your BG readings, you will be required to see me monthly or every 2 weeks to stay active with the program.   Next appointment is Friday June 6th at 4:30 PM.     Aundra Millet D. Vivia Ewing, PharmD, BCPS

## 2013-01-26 ENCOUNTER — Encounter (INDEPENDENT_AMBULATORY_CARE_PROVIDER_SITE_OTHER): Payer: Self-pay

## 2013-01-27 MED ORDER — DOXYCYCLINE HYCLATE 100 MG PO TABS: 100.0000 mg | ORAL_TABLET | Freq: Two times a day (BID) | ORAL | Status: DC

## 2013-02-03 NOTE — Progress Notes (Signed)
Patient ID: [name], female   DOB: 1981-08-30, 32 y.o.   MRN: [id number] ATTENDING PHYSICIAN NOTE: I have reviewed the chart and agree with the plan as detailed above. Denny Levy MD Pager [phone]

## 2013-03-31 ENCOUNTER — Other Ambulatory Visit: Payer: Self-pay | Admitting: Internal Medicine

## 2013-03-31 MED ORDER — DOXYCYCLINE HYCLATE 100 MG PO TABS: 100.0000 mg | ORAL_TABLET | Freq: Two times a day (BID) | ORAL | Status: DC

## 2013-03-31 MED ORDER — FLUCONAZOLE 150 MG PO TABS: 150.0000 mg | ORAL_TABLET | Freq: Once | ORAL | Status: DC

## 2013-04-04 ENCOUNTER — Ambulatory Visit (INDEPENDENT_AMBULATORY_CARE_PROVIDER_SITE_OTHER): Payer: Self-pay | Admitting: Family Medicine

## 2013-04-04 ENCOUNTER — Ambulatory Visit: Payer: 59 | Admitting: *Deleted

## 2013-04-04 VITALS — BP 124/83 | HR 86 | Ht 65.0 in | Wt 215.4 lb

## 2013-04-04 DIAGNOSIS — E119 Type 2 diabetes mellitus without complications: Secondary | ICD-10-CM

## 2013-04-04 NOTE — Progress Notes (Signed)
Subjective:   Patient presents today for 3 month diabetes follow-up as part of the employer-sponsored [employer] program. Current diabetes regimen includes Humalog insulin in her pump. No med changes or major health changes at this time.   Pending appointments- pump educator/RD is pending for the 23rd and endocrinologist visit with Dr. Talmage Nap is on the 29th.   Work has been crazy- they are short staffed at work. 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 She sometimes does not get a lunch. She hasn't been checking her sugars on some days.   She has resumed smoking again and is not wearing the NRT patches. She is up to 10 cigarettes daily for the past 2 weeks. She is not smoking at work because she is too busy.    Disease Assessments:  Diabetes:  Type of Diabetes: Type 1; checks feet daily; uses glucometer; takes medications as prescribed; does not take an aspirin a day; Year of diagnosis 1999; Sees Diabetes provider 3 times per year; checks blood glucose 2 times a day; MD managing Diabetes Dr. Talmage Nap;   hypoglycemia frequency none; Lowest CBG 58; 7 day CBG average 232; 14 day CBG average 218; 30 day CBG average 260; Highest CBG 398;   Other Diabetes History: For the past 2 weeks she has only checked her blood sugar 5 times (this is when all the craziness at work started).    Past Medical/Surgical History:  Other Medical History:  DM1  Tobacco Assessment:  Smoking Status: Current every day smoker Smoking 10 cigarettes daily; Last Reviewed: 04/04/2013  Pack-years: 14; smoked for 14; Date quit smoking: 09/10/2011    Physical Activity- She has not been able to complete physical activity due to stressful working situation. She sometimes goes outside with her daughter.   Nutrition- In the past two weeks she has not been getting a sit down lunch. Otherwise she is snacking in between patients. She has a referral pending to see the RD at the [facility]. She states  that she isn't eating as well. She is carrying snacks with her still.     Hemoglobin A1c: 01/21/2013    Dilated Eye Exam: 02/22/2013  Flu vaccine: 07/06/2012  Foot Exam: 12/01/2012  Other Preventive Care Notes: dentist last visit - December 2013  Last Endocrinologist Visit 12/01/2012    Vital Signs:  04/04/2013 9:13 AM (EST) BMI 35.7; Height 5 ft 5 in; Weight 214.5 lbs       Assessment/Plan:  Patient is a 32 year old female with DM1 who is currently not meeting A1C goal of less than 6.5%. Last A1C was 11.2%. She has a pending appointment in 2 weeks with Dr. Talmage Nap- I will defer A1C testing to her office and that visit. [name] appeared to be doing better soon after our most recent visit and she was emailing me each week with her CBG readings. For the past month or so she has been responding to my emails, but she hasn't been sending me any results. Upon meter review today she hasn't been consistently checking her CBG- there were 3 readings from the past 7 days and only 5 readings for the past 14 days. She admits that she has not been checking her blood sugar consistently. She continues to wear her pump and use the bolus wizard to cover for her meals, but without a CBG check she is not giving any insulin to correct for hyperglycemia.   We spent the majority of the  visit discussing ways to better take care of herself. She admits that she does not put herself as a priority and is often too busy helping others so that her own health suffers for it. She has stopped exercising and is not making healthy eating choices. It appears that she did speak with her manager on Friday about lightening her load at work and is hopeful that when she returns to work next week (she is on [employer] this week) she will not be as stressed and she will be able to take a lunch break every day.  I set goals with her today, including starting to check her CBG daily before each meal. Patient does not place a priority on managing her  blood sugar, and when things get stressful, she stops checking her CBG. Patient states that she feels like she is running high when she is at work, especially in the last two weeks. I reminded her that she will be better able to handle the stress of her job if her blood sugar is under better control, and the best way to make sure that happens is to be consistent at checking CBGs.   Patient has started smoking again and is up to 1/2 ppd. These cigarettes are smoked at home, mainly in the evening as a response to the stress of the day. Patient agreed to start wearing the patches again. She states that she was doing better when she was reading books and this was also a way to release stress. I encouraged her to start reading again and gave her some book recommendations.   Follow up with patient in 6 weeks. She has an appointment pending with the RD next week and an appointment with her endo in 2 weeks. She agreed to restart sending me weekly CBGs. She states the emails from me were helpful..    Goals for Next Visit-  1. Start wearing the nicotine patch again. Lay the patch next to your blood glucose meter and your keys so that you remember to wear the patch in the morning.  2. Find a book to read- use this as your "crutch" to replace cigarettes- something you can turn to when you are stressed and needing relief. 3. Aim for 3 days a week of physical activity for 30 minutes. Possible ideas- dance DVD with McKenzie, walk around the neighborhood when you are at your Mom's house.  4. Sit down and eat lunch every day when you are at work. 5. Check your blood sugar at least twice a day- should be every time before you are getting ready to eat. 6. Continue sending me your weekly CBG checks.   Next appointment with me is August 29th at 9 AM.

## 2013-04-06 ENCOUNTER — Ambulatory Visit: Payer: 59 | Admitting: *Deleted

## 2013-04-13 ENCOUNTER — Encounter: Payer: Self-pay | Admitting: *Deleted

## 2013-04-13 ENCOUNTER — Encounter: Payer: 59 | Attending: Internal Medicine | Admitting: *Deleted

## 2013-04-13 VITALS — Ht 65.0 in | Wt 216.5 lb

## 2013-04-13 DIAGNOSIS — Z713 Dietary counseling and surveillance: Secondary | ICD-10-CM | POA: Insufficient documentation

## 2013-04-13 DIAGNOSIS — E109 Type 1 diabetes mellitus without complications: Secondary | ICD-10-CM | POA: Insufficient documentation

## 2013-04-19 NOTE — Progress Notes (Signed)
Patient ID: [name], female   DOB: [birth year], 32 y.o.   MRN: [id number] ATTENDING PHYSICIAN NOTE: I have reviewed the chart and agree with the plan as detailed above. Denny Levy MD Pager [phone]

## 2013-05-05 NOTE — Patient Instructions (Addendum)
Plan:  Aim for 3 Carb Choices per meal (45 grams) +/- 1 either way  Aim for 0-2 Carbs per snack if hungry  Consider reading food labels for Total Carbohydrate and Fat Grams of foods Consider  increasing your activity daily as tolerated Consider checking BG at alternate times per day as directed by MD  Consider using your Bolus Wizard on your pump to provide correction insulin at meal time in addition to carbohydrate insulin

## 2013-05-05 NOTE — Progress Notes (Signed)
  Medical Nutrition Therapy:  Appt start time: 1600 end time:  1700.  Assessment:  Primary concerns today: patient here for weight management with DM1 and on insulin pump. She is participating in [employer] and is working with Gerre Pebbles, Pharm D. She lives with her sister and her 32 yo daughter. She works for [employer] from 8:30 to 5:30 or later Mondays through Fridays.  She would like more information on use of her Medtronic insulin pump, suggestions for Sick Day management, and assistance with weight loss  MEDICATIONS: see list. Insulin is Humalog in her insulin pump   DIETARY INTAKE:  Usual eating pattern includes 3 meals and 1-3 snacks per day.  Everyday foods include good variety of all food groups.  Avoided foods include none stated.    24-hr recall:  B ( AM): oatmeal or grits x 1 cup with Splenda and Diet Mountain Dew to drink  Snk ( AM): none  L ( PM): food is catered in to MD office each day. Variety of starches, protein and vegetables available Snk ( PM): none D ( PM): meat, starch vegetable type meal or occasionally fast food Snk ( PM): none unless BG is too low Beverages: diet Mountain Dew  Usual physical activity: not much right now  Estimated energy needs: 1400 calories 158 g carbohydrates 105 g protein 39 g fat  Progress Towards Goal(s):  In progress.   Nutritional Diagnosis:  NB-1.1 Food and nutrition-related knowledge deficit As related to diabetes control.  As evidenced by A1c of 11.2% .    Intervention:  Nutrition counseling and diabetes education initiated. Discussed basic physiology of diabetes, SMBG and rationale of checking BG at alternate times of day, A1c, Carb Counting and reading food labels, and benefits of increased activity. Due to her time limitations of working full time and taking care of her 63 year old daughter, we discussed simple goals to get started with and that could be attainable for her.  Plan:  Aim for 3 Carb Choices  per meal (45 grams) +/- 1 either way  Aim for 0-2 Carbs per snack if hungry  Consider reading food labels for Total Carbohydrate and Fat Grams of foods Consider  increasing your activity daily as tolerated Consider checking BG at alternate times per day as directed by MD  Consider using your Bolus Wizard on your pump to provide correction insulin at meal time in addition to carbohydrate insulin  Handouts given during visit include: Living Well with Diabetes Carb Counting and Food Label handouts Meal Plan Card  Monitoring/Evaluation:  Dietary intake, exercise, reading food labels, and body weight in 2 month(s). She will be seeing the Pharm D with [facility]  in the meantime.

## 2013-05-08 ENCOUNTER — Emergency Department (HOSPITAL_COMMUNITY): Payer: 59

## 2013-05-08 ENCOUNTER — Inpatient Hospital Stay (HOSPITAL_COMMUNITY)
Admission: EM | Admit: 2013-05-08 | Discharge: 2013-05-10 | DRG: 638 | Disposition: A | Discharge status: Home / Self Care | Payer: 59 | Attending: Internal Medicine | Admitting: Internal Medicine

## 2013-05-08 ENCOUNTER — Encounter (HOSPITAL_COMMUNITY): Payer: Self-pay

## 2013-05-08 DIAGNOSIS — R079 Chest pain, unspecified: Secondary | ICD-10-CM | POA: Diagnosis present

## 2013-05-08 DIAGNOSIS — J209 Acute bronchitis, unspecified: Secondary | ICD-10-CM

## 2013-05-08 DIAGNOSIS — Z794 Long term (current) use of insulin: Secondary | ICD-10-CM

## 2013-05-08 DIAGNOSIS — F3289 Other specified depressive episodes: Secondary | ICD-10-CM | POA: Diagnosis present

## 2013-05-08 DIAGNOSIS — F17200 Nicotine dependence, unspecified, uncomplicated: Secondary | ICD-10-CM | POA: Diagnosis present

## 2013-05-08 DIAGNOSIS — L732 Hidradenitis suppurativa: Secondary | ICD-10-CM

## 2013-05-08 DIAGNOSIS — E1010 Type 1 diabetes mellitus with ketoacidosis without coma: Principal | ICD-10-CM | POA: Diagnosis present

## 2013-05-08 DIAGNOSIS — K219 Gastro-esophageal reflux disease without esophagitis: Secondary | ICD-10-CM | POA: Diagnosis present

## 2013-05-08 DIAGNOSIS — J45998 Other asthma: Secondary | ICD-10-CM

## 2013-05-08 DIAGNOSIS — Z9641 Presence of insulin pump (external) (internal): Secondary | ICD-10-CM

## 2013-05-08 DIAGNOSIS — D72829 Elevated white blood cell count, unspecified: Secondary | ICD-10-CM | POA: Diagnosis present

## 2013-05-08 DIAGNOSIS — E871 Hypo-osmolality and hyponatremia: Secondary | ICD-10-CM | POA: Diagnosis present

## 2013-05-08 DIAGNOSIS — Z87898 Personal history of other specified conditions: Secondary | ICD-10-CM

## 2013-05-08 DIAGNOSIS — E1110 Type 2 diabetes mellitus with ketoacidosis without coma: Secondary | ICD-10-CM

## 2013-05-08 DIAGNOSIS — J3089 Other allergic rhinitis: Secondary | ICD-10-CM

## 2013-05-08 DIAGNOSIS — Z880 Allergy status to penicillin: Secondary | ICD-10-CM

## 2013-05-08 DIAGNOSIS — E109 Type 1 diabetes mellitus without complications: Secondary | ICD-10-CM | POA: Diagnosis present

## 2013-05-08 DIAGNOSIS — I471 Supraventricular tachycardia: Secondary | ICD-10-CM

## 2013-05-08 DIAGNOSIS — F411 Generalized anxiety disorder: Secondary | ICD-10-CM

## 2013-05-08 DIAGNOSIS — F329 Major depressive disorder, single episode, unspecified: Secondary | ICD-10-CM

## 2013-05-08 DIAGNOSIS — Z79899 Other long term (current) drug therapy: Secondary | ICD-10-CM

## 2013-05-08 DIAGNOSIS — Z8614 Personal history of Methicillin resistant Staphylococcus aureus infection: Secondary | ICD-10-CM

## 2013-05-08 HISTORY — DX: Polycystic ovarian syndrome: E28.2

## 2013-05-08 LAB — BASIC METABOLIC PANEL
BUN: 27 mg/dL — ABNORMAL HIGH (ref 6–23)
BUN: 27 mg/dL — ABNORMAL HIGH (ref 6–23)
CO2: 9 mEq/L — CL (ref 19–32)
CO2: 13 mEq/L — ABNORMAL LOW (ref 19–32)
Calcium: 9.3 mg/dL (ref 8.4–10.5)
Calcium: 8 mg/dL — ABNORMAL LOW (ref 8.4–10.5)
Calcium: 8.1 mg/dL — ABNORMAL LOW (ref 8.4–10.5)
Calcium: 8.1 mg/dL — ABNORMAL LOW (ref 8.4–10.5)
Creatinine, Ser: 0.9 mg/dL (ref 0.50–1.10)
Creatinine, Ser: 0.95 mg/dL (ref 0.50–1.10)
Creatinine, Ser: 0.85 mg/dL (ref 0.50–1.10)
Creatinine, Ser: 0.83 mg/dL (ref 0.50–1.10)
GFR calc Af Amer: >90 mL/min (ref >90)
GFR calc Af Amer: >90 mL/min (ref >90)
GFR calc non Af Amer: 84 mL/min — ABNORMAL LOW (ref >90)
GFR calc non Af Amer: 78 mL/min — ABNORMAL LOW (ref >90)
GFR calc non Af Amer: 79 mL/min — ABNORMAL LOW (ref >90)
GFR calc non Af Amer: >90 mL/min (ref >90)
Glucose, Bld: 443 mg/dL — ABNORMAL HIGH (ref 70–99)
Glucose, Bld: 203 mg/dL — ABNORMAL HIGH (ref 70–99)
Sodium: 137 mEq/L (ref 135–145)

## 2013-05-08 LAB — CBC
MCH: 31.4 pg (ref 26.0–34.0)
Platelets: 361 10*3/uL (ref 150–400)
RBC: 4.3 MIL/uL (ref 3.87–5.11)
RDW: 12.8 % (ref 11.5–15.5)

## 2013-05-08 LAB — CBC WITH DIFFERENTIAL/PLATELET
Basophils Absolute: 0 10*3/uL (ref 0.0–0.1)
Eosinophils Absolute: 0 10*3/uL (ref 0.0–0.7)
Eosinophils Relative: 0 % (ref 0–5)
Lymphs Abs: 1.5 10*3/uL (ref 0.7–4.0)
MCH: 31.7 pg (ref 26.0–34.0)
MCHC: 33 g/dL (ref 30.0–36.0)
MCV: 96.1 fL (ref 78.0–100.0)
Monocytes Absolute: 1.5 10*3/uL — ABNORMAL HIGH (ref 0.1–1.0)
Platelets: 415 10*3/uL — ABNORMAL HIGH (ref 150–400)
RDW: 12.7 % (ref 11.5–15.5)

## 2013-05-08 LAB — GLUCOSE, CAPILLARY
Glucose-Capillary: 518 mg/dL — ABNORMAL HIGH (ref 70–99)
Glucose-Capillary: 448 mg/dL — ABNORMAL HIGH (ref 70–99)
Glucose-Capillary: 373 mg/dL — ABNORMAL HIGH (ref 70–99)

## 2013-05-08 LAB — TROPONIN I: Troponin I: <0.3 ng/mL (ref <0.30)

## 2013-05-08 LAB — URINALYSIS, ROUTINE W REFLEX MICROSCOPIC
Glucose, UA: >1000 mg/dL — AB
Ketones, ur: >80 mg/dL — AB
Leukocytes, UA: NEGATIVE
Protein, ur: NEGATIVE mg/dL

## 2013-05-08 LAB — URINE MICROSCOPIC-ADD ON

## 2013-05-08 LAB — POCT PREGNANCY, URINE: Preg Test, Ur: NEGATIVE

## 2013-05-08 LAB — MRSA PCR SCREENING: MRSA by PCR: NEGATIVE

## 2013-05-08 MED ORDER — PANTOPRAZOLE SODIUM 40 MG IV SOLR
40.0000 mg | Freq: Once | INTRAVENOUS | Status: AC
  Administered 2013-05-08: 40 mg via INTRAVENOUS
  Filled 2013-05-08: qty 40

## 2013-05-08 MED ORDER — SODIUM CHLORIDE 0.9 % IV SOLN: INTRAVENOUS | Status: DC

## 2013-05-08 MED ORDER — NICOTINE 7 MG/24HR TD PT24
7.0000 mg | MEDICATED_PATCH | Freq: Every day | TRANSDERMAL | Status: DC
  Administered 2013-05-08: 7 mg via TRANSDERMAL
  Filled 2013-05-08 (×2): qty 1

## 2013-05-08 MED ORDER — ENOXAPARIN SODIUM 40 MG/0.4ML ~~LOC~~ SOLN
40.0000 mg | SUBCUTANEOUS | Status: DC
  Administered 2013-05-08 – 2013-05-09 (×2): 40 mg via SUBCUTANEOUS
  Filled 2013-05-08 (×4): qty 0.4

## 2013-05-08 MED ORDER — RANITIDINE NICU IV SYRINGE 25 MG/ML
150.0000 mg | INJECTION | Freq: Once | INTRAMUSCULAR | Status: DC
Start: 2013-05-08 — End: 2013-05-08

## 2013-05-08 MED ORDER — SODIUM CHLORIDE 0.9 % IV SOLN
INTRAVENOUS | Status: DC
  Administered 2013-05-08: 18:00:00 via INTRAVENOUS

## 2013-05-08 MED ORDER — FAMOTIDINE IN NACL 20-0.9 MG/50ML-% IV SOLN
20.0000 mg | Freq: Once | INTRAVENOUS | Status: AC
  Administered 2013-05-08: 20 mg via INTRAVENOUS
  Filled 2013-05-08 (×2): qty 50

## 2013-05-08 MED ORDER — HYDROMORPHONE HCL PF 1 MG/ML IJ SOLN
1.0000 mg | Freq: Once | INTRAMUSCULAR | Status: AC
  Administered 2013-05-08: 1 mg via INTRAVENOUS
  Filled 2013-05-08: qty 1

## 2013-05-08 MED ORDER — DEXTROSE-NACL 5-0.45 % IV SOLN
INTRAVENOUS | Status: DC
  Administered 2013-05-08 – 2013-05-09 (×2): via INTRAVENOUS

## 2013-05-08 MED ORDER — SODIUM CHLORIDE 0.9 % IV SOLN
INTRAVENOUS | Status: DC
  Administered 2013-05-08 – 2013-05-10 (×4): via INTRAVENOUS

## 2013-05-08 MED ORDER — INSULIN ASPART 100 UNIT/ML ~~LOC~~ SOLN
10.0000 [IU] | Freq: Once | SUBCUTANEOUS | Status: AC
  Administered 2013-05-08: 10 [IU] via INTRAVENOUS
  Filled 2013-05-08: qty 1

## 2013-05-08 MED ORDER — ONDANSETRON HCL 4 MG/2ML IJ SOLN
4.0000 mg | Freq: Three times a day (TID) | INTRAMUSCULAR | Status: DC | PRN
  Administered 2013-05-08: 4 mg via INTRAVENOUS
  Filled 2013-05-08 (×2): qty 2

## 2013-05-08 MED ORDER — DEXTROSE 50 % IV SOLN: 25.0000 mL | INTRAVENOUS | Status: DC | PRN

## 2013-05-08 MED ORDER — SODIUM CHLORIDE 0.9 % IV BOLUS (SEPSIS)
1000.0000 mL | Freq: Once | INTRAVENOUS | Status: AC
  Administered 2013-05-08: 1000 mL via INTRAVENOUS

## 2013-05-08 MED ORDER — SODIUM CHLORIDE 0.9 % IV SOLN
INTRAVENOUS | Status: DC
  Administered 2013-05-08: 3.5 [IU]/h via INTRAVENOUS
  Administered 2013-05-09: 3.9 [IU]/h via INTRAVENOUS
  Administered 2013-05-09: 5.4 [IU]/h via INTRAVENOUS
  Administered 2013-05-09: 8.7 [IU]/h via INTRAVENOUS
  Administered 2013-05-09: 2.9 [IU]/h via INTRAVENOUS
  Filled 2013-05-08 (×3): qty 1

## 2013-05-08 MED ORDER — HYDROMORPHONE HCL PF 1 MG/ML IJ SOLN
1.0000 mg | INTRAMUSCULAR | Status: AC | PRN
  Administered 2013-05-08 – 2013-05-09 (×2): 1 mg via INTRAVENOUS
  Filled 2013-05-08 (×2): qty 1

## 2013-05-08 NOTE — ED Notes (Signed)
413 CBG

## 2013-05-08 NOTE — H&P (Signed)
[facility] History and Physical  YTZEL GUBLER ZOX:[id number] DOB: 1981-05-02 DOA: 05/08/2013  Referring physician: Dr. Deretha Emory PCP: Cala Bradford, MD  Specialists: none  Chief Complaint: hyperglycemia  HPI: [name] is a 32 y.o. female has a past medical history significant for insulin-dependent type 1 diabetes mellitus, tobacco abuse, reflux disease, presents with a chief complaint of epigastric pain nausea and vomiting of sudden onset this morning as well as chest pain. Patient is a type I diabetic and she is on insulin pump that was initiated 2 years ago. Prior to the insulin pump she had episodes of DKA, however none in the last 2 years. She has a [employer] pump which was changed about 6 months ago. Her PCP is Dr. Jennette Kettle and her endocrinologist is Dr. Talmage Nap.  She noticed yesterday that her sugars have been a little bit higher than normal at 258, and she changed her catheter at that time worried that he might be kinked. When she woke up this morning, her symptoms are so severe that she decided to come to the emergency room since the demise of her upper previous DKA episodes. Her basal insulin rates in the pump is 2.05 from 8 PM to 9 AM and 1.45 from 9 AM to 8 PM. Her carb ratio is 1 to 5A she uses a Transport planner. She is not sure what her insulin sensitivity is. She endorses feeling chills starting this morning, but yesterday she felt normal. She denies any diarrhea. She denies any shortness of breath or cough or sputum production. She has no ear pain or ear discharges or sinus tenderness. She does endorse that couple weeks ago she noticed that the right calf was more swollen than the left and she endorses pain with walking. The pain has improved, however her swelling has persisted. She has no burning with urination or increased frequency. In the emergency room, labs were evident for severe DKA and [facility] was consulted for admission.  Review of Systems: As per history of  present illness, otherwise negative  Past Medical History  Diagnosis Date  . Hx MRSA infection   . Migraine   . Vitamin D insufficiency   . Depression   . Asthma     as a child  . GERD (gastroesophageal reflux disease)     no current meds.  . Hidradenitis 04/2012    bilat. thighs, left groin - open areas on thighs  . Diabetes mellitus     IDDM, Insulin pump; followed by Dr. Talmage Coin  . Abscess of left axilla - PREVOTELLA BIVIA & Staph Coag Neg 07/12/2012    MODERATE PREVOTELLA BIVIA Note: BETA LACTAMASE NEGATIVE    . PCOS (polycystic ovarian syndrome)    Past Surgical History  Procedure Laterality Date  . Wisdom tooth extraction    . Tonsillectomy    . Bunionectomy      left  . Esophagogastroduodenoscopy  11/17/2011    Procedure: ESOPHAGOGASTRODUODENOSCOPY (EGD);  Surgeon: Shirley Friar, MD;  Location: [facility];  Service: Endoscopy;  Laterality: N/A;  . Eye surgery      exc. stye left eye  . Breast surgery      right lumpectomy  . Cholecystectomy  12/02/2006    lap. chole.  . Dilation and evacuation  02/14/2007  . Hydradenitis excision  04/30/2012    Procedure: EXCISION HYDRADENITIS GROIN;  Surgeon: Shelly Rubenstein, MD;  Location: [facility];  Service: General;  Laterality: Left;  wide excision hidradenitis bilateral thighs and  Left groin   Social History:  reports that she has been smoking Cigarettes.  She has a 7 pack-year smoking history. She has never used smokeless tobacco. She reports that  drinks alcohol. She reports that she does not use illicit drugs.  Allergies  Allergen Reactions  . Latex Hives, Shortness Of Breath and Rash  . Levofloxacin Shortness Of Breath and Rash  . Moxifloxacin Shortness Of Breath and Rash  . Oxycodone-Acetaminophen Shortness Of Breath, Swelling and Rash    NORCO/VICODIN OK  . Peach [Prunus Persica] Hives and Shortness Of Breath  . Penicillins Swelling and Rash  . Propoxyphene-Acetaminophen Swelling     SWELLING OF FACE AND THROAT  . Rosiglitazone Maleate Swelling    SWELLING OF FACE AND LEGS  . Xolair [Omalizumab]     Rash and anaphyllaxis  . Adhesive [Tape] Hives, Itching and Rash  . Potassium-Containing Compounds Other (See Comments)    IV ROUTE - CAUSES VEINS TO COLLAPSE  . Prednisone Other (See Comments)    SEVERE ELEVATION OF BLOOD SUGAR. Able to tolerate 40 mg  . Morphine And Related Other (See Comments)    Causes hallucinations  . Cefaclor Rash  . Keflex [Cephalexin] Diarrhea and Rash  . Promethazine Hcl Other (See Comments)    IV ROUTE ONLY - JITTERY FEELING. Patient reports that it is mild and she has used promethazine since then  . Sulfadiazine Rash     Family history noncontributory  Prior to Admission medications   Medication Sig Start Date End Date Taking? Authorizing Provider  insulin lispro (HUMALOG) 100 UNIT/ML injection Inject 75-100 Units into the skin continuous. Via Insulin pump, bolus at meals   Yes Historical Provider, MD  EPINEPHrine (EPI-PEN) 0.3 mg/0.3 mL DEVI Inject 0.3 mLs (0.3 mg total) into the muscle once. 09/29/12 09/29/13  Waymon Budge, MD  glucagon (GLUCAGEN) 1 MG SOLR Inject 1 mg into the vein once as needed (severe hypoglycemia).     Historical Provider, MD  insulin glargine (LANTUS) 100 UNIT/ML injection Inject into the skin at bedtime.    Historical Provider, MD   Physical Exam: Filed Vitals:   05/08/13 1126 05/08/13 1413 05/08/13 1524  BP: 97/69  101/49  Pulse: 115 111 110  Temp: 97.9 F (36.6 C)    TempSrc: Oral    Resp: 24 22 21   Height: 5\' 5"  (1.651 m)    Weight: 98.884 kg (218 lb)    SpO2: 99% 98% 99%     General:  No apparent distress  Eyes: PERRL, EOMI, no scleral icterus  ENT: Dry mucous membranes  Neck: supple, no JVD  Cardiovascular: regular rate without MRG; 2+ peripheral pulses  Respiratory: CTA biL, good air movement without wheezing, rhonchi or crackled  Abdomen: soft, non tender to palpation, positive bowel  sounds, no guarding, no rebound  Skin: no rashes  Musculoskeletal: no peripheral edema, appreciate moderately larger right calf compared to left.  Psychiatric: normal mood and affect  Neurologic: CN 2-12 grossly intact, MS 5/5 in all 4  Labs on Admission:  Basic Metabolic Panel:  Recent Labs Lab 05/08/13 1309  NA 132*  K 5.5*  CL 93*  CO2 9*  GLUCOSE 606*  BUN 27*  CREATININE 0.90  CALCIUM 9.3   CBC:  Recent Labs Lab 05/08/13 1309  WBC 25.3*  NEUTROABS 22.3*  HGB 14.6  HCT 44.2  MCV 96.1  PLT 415*   Cardiac Enzymes:  Recent Labs Lab 05/08/13 1309  TROPONINI <0.30    CBG:  Recent  Labs Lab 05/08/13 1215 05/08/13 1408 05/08/13 1512  GLUCAP 518* 448* 413*    Radiological Exams on Admission: Dg Chest 2 View  05/08/2013   *RADIOLOGY REPORT*  Clinical Data: Chest pain and vomiting.  CHEST - 2 VIEW  Comparison:  09/24/2012  Findings:  The heart size and mediastinal contours are within normal limits.  Both lungs are clear.  The visualized skeletal structures are unremarkable.  IMPRESSION: No active cardiopulmonary disease.   Original Report Authenticated By: Richarda Overlie, M.D.    EKG: Independently reviewed.  Assessment/Plan Active Problems:   DIABETES MELLITUS, TYPE I   HYPONATREMIA   SMOKER  DKA, severe - Patient will be admitted to the step down unit. She was started on insulin drip, monitor BMP every 2 hours, keep n.p.o. until comes off of the insulin drip, will probably need Lantus tonight and to resume her insulin pump tomorrow. We'll consult diabetes educator. - No apparent infectious source, she does have leukocytosis which might be reactive. She has no urinary or respiratory symptoms. She does have the right calf swelling which will be further investigated with a lower extremity ultrasound.  Relative hyponatremia - noted in the setting of DKA. Monitor.  Metabolic Acidosis with bicarbonate less than 10 - due to #1 Chest pain - she felt chest  pain/pressure, will check troponins x3 Leukocytosis - possibly reactive, monitor. Tobacco abuse - nicotine patch DVT prophylaxis - Lovenox  Code Status: Presumed full  Family Communication: Family in the room  Disposition Plan: Admit to step down  Time spent: 35  Niyanna Asch M. Elvera Lennox, MD [facility] Pager [phone]  If 7PM-7AM, please contact night-coverage [email or web address] Password [facility] 05/08/2013, 4:03 PM

## 2013-05-08 NOTE — ED Notes (Signed)
Patient is resting comfortably.  State the meds have helped make her more comfortable.

## 2013-05-08 NOTE — ED Notes (Addendum)
7.1 units/ hour - insulin infusion (Tim, Consulting civil engineer increased dosage at pump.

## 2013-05-08 NOTE — ED Provider Notes (Signed)
CSN: [id number]     Arrival date & time 05/08/13  1110 History     First MD Initiated Contact with Patient 05/08/13 1155     Chief Complaint  Patient presents with  . Hyperglycemia  . Chest Pain  . Emesis   (Consider location/radiation/quality/duration/timing/severity/associated sxs/prior Treatment) The history is provided by the patient.   32 year old female followed by Laurann Montana. Patient is a known diabetic with an insulin pump. Patient felt well until last evening when she started to feel somewhat ill appearing had some epigastric discomfort and some chest discomfort that she thought was more consistent with her gastric reflux. By this morning she started with vomiting crampy abdominal pain epigastric area mostly. Chest discomfort she did radiate to her left arm that started at the 3:00 in the morning but that is improving now. Patient does have an insulin pump and her blood sugars were in the 500s this morning. Patient said trouble with DKA in the past. Patient's symptoms are not inconsistent with her DKA.  Past Medical History  Diagnosis Date  . Hx MRSA infection   . Migraine   . Vitamin D insufficiency   . Depression   . Asthma     as a child  . GERD (gastroesophageal reflux disease)     no current meds.  . Hidradenitis 04/2012    bilat. thighs, left groin - open areas on thighs  . Diabetes mellitus     IDDM, Insulin pump; followed by Dr. Talmage Coin  . Abscess of left axilla - PREVOTELLA BIVIA & Staph Coag Neg 07/12/2012    MODERATE PREVOTELLA BIVIA Note: BETA LACTAMASE NEGATIVE    . PCOS (polycystic ovarian syndrome)    Past Surgical History  Procedure Laterality Date  . Wisdom tooth extraction    . Tonsillectomy    . Bunionectomy      left  . Esophagogastroduodenoscopy  11/17/2011    Procedure: ESOPHAGOGASTRODUODENOSCOPY (EGD);  Surgeon: Shirley Friar, MD;  Location: [facility];  Service: Endoscopy;  Laterality: N/A;  . Eye surgery      exc. stye left  eye  . Breast surgery      right lumpectomy  . Cholecystectomy  12/02/2006    lap. chole.  . Dilation and evacuation  02/14/2007  . Hydradenitis excision  04/30/2012    Procedure: EXCISION HYDRADENITIS GROIN;  Surgeon: Shelly Rubenstein, MD;  Location: [facility];  Service: General;  Laterality: Left;  wide excision hidradenitis bilateral thighs and Left groin   No family history on file. History  Substance Use Topics  . Smoking status: Current Some Day Smoker -- 0.50 packs/day for 14 years    Types: Cigarettes  . Smokeless tobacco: Never Used     Comment: 5 cig./day  . Alcohol Use: Yes     Comment: occasionally   OB History   Grav Para Term Preterm Abortions TAB SAB Ect Mult Living   4    3  3   1      Review of Systems  Constitutional: Negative for fever.  HENT: Negative for congestion and neck pain.   Eyes: Negative for redness.  Respiratory: Negative for shortness of breath.   Cardiovascular: Positive for chest pain.  Gastrointestinal: Positive for abdominal pain. Negative for nausea and vomiting.  Endocrine: Positive for polyuria.  Genitourinary: Negative for dysuria.  Musculoskeletal: Negative for back pain.  Skin: Negative for rash.  Neurological: Negative for headaches.  Hematological: Does not bruise/bleed easily.  Psychiatric/Behavioral: Negative for confusion.  Allergies  Latex; Levofloxacin; Moxifloxacin; Oxycodone-acetaminophen; Peach; Penicillins; Propoxyphene-acetaminophen; Rosiglitazone maleate; Xolair; Adhesive; Potassium-containing compounds; Prednisone; Morphine and related; Cefaclor; Keflex; Promethazine hcl; and Sulfadiazine  Home Medications   Current Outpatient Rx  Name  Route  Sig  Dispense  Refill  . insulin lispro (HUMALOG) 100 UNIT/ML injection   Subcutaneous   Inject 75-100 Units into the skin continuous. Via Insulin pump, bolus at meals         . EPINEPHrine (EPI-PEN) 0.3 mg/0.3 mL DEVI   Intramuscular   Inject 0.3 mLs  (0.3 mg total) into the muscle once.   1 Device   prn   . glucagon (GLUCAGEN) 1 MG SOLR   Intravenous   Inject 1 mg into the vein once as needed (severe hypoglycemia).          . insulin glargine (LANTUS) 100 UNIT/ML injection   Subcutaneous   Inject into the skin at bedtime.          BP 97/69  Pulse 111  Temp(Src) 97.9 F (36.6 C) (Oral)  Resp 22  Ht 5\' 5"  (1.651 m)  Wt 218 lb (98.884 kg)  BMI 36.28 kg/m2  SpO2 98%  LMP 04/06/2013 Physical Exam  Nursing note and vitals reviewed. Constitutional: She is oriented to person, place, and time. She appears well-developed and well-nourished. No distress.  HENT:  Head: Normocephalic and atraumatic.  Mouth/Throat: Oropharynx is clear and moist.  Eyes: Conjunctivae and EOM are normal. Pupils are equal, round, and reactive to light.  Neck: Normal range of motion.  Cardiovascular: Normal rate, regular rhythm and normal heart sounds.   No murmur heard. Pulmonary/Chest: Effort normal and breath sounds normal. No respiratory distress.  Abdominal: Soft. Bowel sounds are normal. There is no tenderness.  Musculoskeletal: Normal range of motion.  Neurological: She is alert and oriented to person, place, and time. No cranial nerve deficit. She exhibits normal muscle tone. Coordination normal.  Skin: Skin is warm. No rash noted.    ED Course   Procedures (including critical care time)  Labs Reviewed  GLUCOSE, CAPILLARY - Abnormal; Notable for the following:    Glucose-Capillary 518 (*)    All other components within normal limits  CBC WITH DIFFERENTIAL - Abnormal; Notable for the following:    WBC 25.3 (*)    Platelets 415 (*)    Neutrophils Relative % 88 (*)    Lymphocytes Relative 6 (*)    Neutro Abs 22.3 (*)    Monocytes Absolute 1.5 (*)    All other components within normal limits  BASIC METABOLIC PANEL - Abnormal; Notable for the following:    Sodium 132 (*)    Potassium 5.5 (*)    Chloride 93 (*)    CO2 9 (*)     Glucose, Bld 606 (*)    BUN 27 (*)    GFR calc non Af Amer 84 (*)    All other components within normal limits  URINALYSIS, ROUTINE W REFLEX MICROSCOPIC - Abnormal; Notable for the following:    Specific Gravity, Urine 1.035 (*)    Glucose, UA >1000 (*)    Hgb urine dipstick MODERATE (*)    Ketones, ur >80 (*)    All other components within normal limits  GLUCOSE, CAPILLARY - Abnormal; Notable for the following:    Glucose-Capillary 448 (*)    All other components within normal limits  TROPONIN I  URINE MICROSCOPIC-ADD ON  POCT PREGNANCY, URINE    Date: 05/08/2013  Rate: 111  Rhythm: sinus tachycardia  QRS Axis: normal  Intervals: normal  ST/T Wave abnormalities: normal  Conduction Disutrbances:none  Narrative Interpretation:   Old EKG Reviewed: unchanged EKG any significant changes compared to 01/16/2011.  Results for orders placed during the hospital encounter of 05/08/13  GLUCOSE, CAPILLARY      Result Value Range   Glucose-Capillary 518 (*) 70 - 99 mg/dL   Comment 1 Notify RN    CBC WITH DIFFERENTIAL      Result Value Range   WBC 25.3 (*) 4.0 - 10.5 K/uL   RBC 4.60  3.87 - 5.11 MIL/uL   Hemoglobin 14.6  12.0 - 15.0 g/dL   HCT 16.1  09.6 - 04.5 %   MCV 96.1  78.0 - 100.0 fL   MCH 31.7  26.0 - 34.0 pg   MCHC 33.0  30.0 - 36.0 g/dL   RDW 40.9  81.1 - 91.4 %   Platelets 415 (*) 150 - 400 K/uL   Neutrophils Relative % 88 (*) 43 - 77 %   Lymphocytes Relative 6 (*) 12 - 46 %   Monocytes Relative 6  3 - 12 %   Eosinophils Relative 0  0 - 5 %   Basophils Relative 0  0 - 1 %   Neutro Abs 22.3 (*) 1.7 - 7.7 K/uL   Lymphs Abs 1.5  0.7 - 4.0 K/uL   Monocytes Absolute 1.5 (*) 0.1 - 1.0 K/uL   Eosinophils Absolute 0.0  0.0 - 0.7 K/uL   Basophils Absolute 0.0  0.0 - 0.1 K/uL   Smear Review MORPHOLOGY UNREMARKABLE    BASIC METABOLIC PANEL      Result Value Range   Sodium 132 (*) 135 - 145 mEq/L   Potassium 5.5 (*) 3.5 - 5.1 mEq/L   Chloride 93 (*) 96 - 112 mEq/L   CO2 9  (*) 19 - 32 mEq/L   Glucose, Bld 606 (*) 70 - 99 mg/dL   BUN 27 (*) 6 - 23 mg/dL   Creatinine, Ser 7.82  0.50 - 1.10 mg/dL   Calcium 9.3  8.4 - 95.6 mg/dL   GFR calc non Af Amer 84 (*) >90 mL/min   GFR calc Af Amer >90  >90 mL/min  URINALYSIS, ROUTINE W REFLEX MICROSCOPIC      Result Value Range   Color, Urine YELLOW  YELLOW   APPearance CLEAR  CLEAR   Specific Gravity, Urine 1.035 (*) 1.005 - 1.030   pH 5.0  5.0 - 8.0   Glucose, UA >1000 (*) NEGATIVE mg/dL   Hgb urine dipstick MODERATE (*) NEGATIVE   Bilirubin Urine NEGATIVE  NEGATIVE   Ketones, ur >80 (*) NEGATIVE mg/dL   Protein, ur NEGATIVE  NEGATIVE mg/dL   Urobilinogen, UA 0.2  0.0 - 1.0 mg/dL   Nitrite NEGATIVE  NEGATIVE   Leukocytes, UA NEGATIVE  NEGATIVE  TROPONIN I      Result Value Range   Troponin I <0.30  <0.30 ng/mL  URINE MICROSCOPIC-ADD ON      Result Value Range   Squamous Epithelial / LPF RARE  RARE   WBC, UA 0-2  <3 WBC/hpf   Urine-Other RARE YEAST    GLUCOSE, CAPILLARY      Result Value Range   Glucose-Capillary 448 (*) 70 - 99 mg/dL   Comment 1 Haydon  WELCHEL    POCT PREGNANCY, URINE      Result Value Range   Preg Test, Ur NEGATIVE  NEGATIVE     1. DKA (diabetic ketoacidoses)  2. Esophageal reflux    CRITICAL CARE Performed by: Shelda Jakes. Total critical care time: 30 Critical care time was exclusive of separately billable procedures and treating other patients. Critical care was necessary to treat or prevent imminent or life-threatening deterioration. Critical care was time spent personally by me on the following activities: development of treatment plan with patient and/or surrogate as well as nursing, discussions with consultants, evaluation of patient's response to treatment, examination of patient, obtaining history from patient or surrogate, ordering and performing treatments and interventions, ordering and review of laboratory studies, ordering and review of radiographic studies,  pulse oximetry and re-evaluation of patient's condition.  MDM  Patient has history of DKA. Patient was well until yesterday evening presents today with hyperglycemia epigastric abdominal discomfort and chest discomfort and some nausea and vomiting. Patient's labs are consistent with the severe DKA. Started on glucose stabilizer. IV fluids normal saline provided. EKG does not have any acute changes and chest pain symptoms seem to be more consistent with her reflux. Patient given protonic. Patient will require hospitalist admission.  Markedly leukocytosis no evidence of infection present no fever urinalysis is negative. Chest x-ray is negative for pneumonia.    Shelda Jakes, MD 05/08/13 [phone]

## 2013-05-08 NOTE — ED Notes (Signed)
Current CBG 448

## 2013-05-08 NOTE — ED Notes (Signed)
Bed: WA02 Expected date:  Expected time:  Means of arrival: Ambulance Comments: Hyperglycemic/+ insulin pump 32 y o female

## 2013-05-08 NOTE — ED Notes (Addendum)
Pt awoke around 2am, had pressure in chest and started to vomit.  CBG by EMS 570 w/kussmal breathing and fruity breath. Has insulin pump.  EMS placed #20 IV w/ 400NS bolus and pt more awake then she was initially per EMS.   HR initially 150 ST now down to 103.  EKG showed ST, EMS didn't feel her chest pain is cardiac related.  EMS also gave zofran 4mg  w/relief. States she has not been sick but has been working 3 jobs x last 2 months and has been under a lot of stress.  CBG's have been running in 200's since working these 3 jobs.

## 2013-05-09 ENCOUNTER — Encounter (HOSPITAL_COMMUNITY): Payer: Self-pay

## 2013-05-09 DIAGNOSIS — E1010 Type 1 diabetes mellitus with ketoacidosis without coma: Secondary | ICD-10-CM | POA: Diagnosis present

## 2013-05-09 DIAGNOSIS — M7989 Other specified soft tissue disorders: Secondary | ICD-10-CM

## 2013-05-09 DIAGNOSIS — E109 Type 1 diabetes mellitus without complications: Secondary | ICD-10-CM

## 2013-05-09 DIAGNOSIS — M79609 Pain in unspecified limb: Secondary | ICD-10-CM

## 2013-05-09 LAB — BASIC METABOLIC PANEL
BUN: 25 mg/dL — ABNORMAL HIGH (ref 6–23)
BUN: 24 mg/dL — ABNORMAL HIGH (ref 6–23)
BUN: 24 mg/dL — ABNORMAL HIGH (ref 6–23)
CO2: 16 mEq/L — ABNORMAL LOW (ref 19–32)
CO2: 16 mEq/L — ABNORMAL LOW (ref 19–32)
CO2: 17 mEq/L — ABNORMAL LOW (ref 19–32)
CO2: 20 mEq/L (ref 19–32)
Calcium: 8.4 mg/dL (ref 8.4–10.5)
Calcium: 7.9 mg/dL — ABNORMAL LOW (ref 8.4–10.5)
Calcium: 8.4 mg/dL (ref 8.4–10.5)
Chloride: 109 mEq/L (ref 96–112)
Chloride: 107 mEq/L (ref 96–112)
Chloride: 104 mEq/L (ref 96–112)
Creatinine, Ser: 0.72 mg/dL (ref 0.50–1.10)
Creatinine, Ser: 0.71 mg/dL (ref 0.50–1.10)
Creatinine, Ser: 0.72 mg/dL (ref 0.50–1.10)
Creatinine, Ser: 0.66 mg/dL (ref 0.50–1.10)
GFR calc Af Amer: >90 mL/min (ref >90)
GFR calc Af Amer: >90 mL/min (ref >90)
GFR calc Af Amer: >90 mL/min (ref >90)
GFR calc non Af Amer: >90 mL/min (ref >90)
GFR calc non Af Amer: >90 mL/min (ref >90)
GFR calc non Af Amer: >90 mL/min (ref >90)
Glucose, Bld: 122 mg/dL — ABNORMAL HIGH (ref 70–99)
Glucose, Bld: 191 mg/dL — ABNORMAL HIGH (ref 70–99)
Glucose, Bld: 210 mg/dL — ABNORMAL HIGH (ref 70–99)
Glucose, Bld: 189 mg/dL — ABNORMAL HIGH (ref 70–99)
Potassium: 3.8 mEq/L (ref 3.5–5.1)
Potassium: 4 mEq/L (ref 3.5–5.1)
Potassium: 3.2 mEq/L — ABNORMAL LOW (ref 3.5–5.1)
Potassium: 3.6 mEq/L (ref 3.5–5.1)
Sodium: 140 mEq/L (ref 135–145)
Sodium: 140 mEq/L (ref 135–145)
Sodium: 135 mEq/L (ref 135–145)
Sodium: 136 mEq/L (ref 135–145)
Sodium: 135 mEq/L (ref 135–145)

## 2013-05-09 LAB — CBC
MCH: 31.4 pg (ref 26.0–34.0)
MCHC: 33.7 g/dL (ref 30.0–36.0)
Platelets: 325 10*3/uL (ref 150–400)
RBC: 4.08 MIL/uL (ref 3.87–5.11)
RDW: 13 % (ref 11.5–15.5)

## 2013-05-09 LAB — GLUCOSE, CAPILLARY
Glucose-Capillary: 105 mg/dL — ABNORMAL HIGH (ref 70–99)
Glucose-Capillary: 108 mg/dL — ABNORMAL HIGH (ref 70–99)
Glucose-Capillary: 127 mg/dL — ABNORMAL HIGH (ref 70–99)
Glucose-Capillary: 136 mg/dL — ABNORMAL HIGH (ref 70–99)
Glucose-Capillary: 142 mg/dL — ABNORMAL HIGH (ref 70–99)
Glucose-Capillary: 159 mg/dL — ABNORMAL HIGH (ref 70–99)
Glucose-Capillary: 161 mg/dL — ABNORMAL HIGH (ref 70–99)
Glucose-Capillary: 177 mg/dL — ABNORMAL HIGH (ref 70–99)
Glucose-Capillary: 190 mg/dL — ABNORMAL HIGH (ref 70–99)
Glucose-Capillary: 192 mg/dL — ABNORMAL HIGH (ref 70–99)
Glucose-Capillary: 201 mg/dL — ABNORMAL HIGH (ref 70–99)
Glucose-Capillary: 217 mg/dL — ABNORMAL HIGH (ref 70–99)
Glucose-Capillary: 251 mg/dL — ABNORMAL HIGH (ref 70–99)
Glucose-Capillary: 278 mg/dL — ABNORMAL HIGH (ref 70–99)
Glucose-Capillary: 413 mg/dL — ABNORMAL HIGH (ref 70–99)

## 2013-05-09 MED ORDER — ONDANSETRON HCL 4 MG/2ML IJ SOLN: 4.0000 mg | INTRAMUSCULAR | Status: DC | PRN

## 2013-05-09 MED ORDER — ACETAMINOPHEN 325 MG PO TABS
650.0000 mg | ORAL_TABLET | Freq: Four times a day (QID) | ORAL | Status: DC | PRN
  Administered 2013-05-09 – 2013-05-10 (×2): 650 mg via ORAL
  Filled 2013-05-09 (×2): qty 2

## 2013-05-09 NOTE — Progress Notes (Signed)
[facility] PROGRESS NOT  Assessment/Plan:  *DKA, type 1/  DIABETES MELLITUS, TYPE I: - pt relates her pump has been having malfunction for the last months. - cont IV insulin HCO3 still <20, cont IV D5. Allow diet. - consult DM coordinator. - CBG's Q1 hrs b-met Q 4hrs.  HYPONATREMIA: - pseudo resolved.  SMOKER  - nicotine patch.  Code Status: full Family Communication: father  Disposition Plan: inpatient   Consultants:  none  Procedures:  none  Antibiotics:  None  HPI/Subjective: Feels better, hungry.  Objective: Filed Vitals:   05/09/13 0000 05/09/13 0003 05/09/13 0200 05/09/13 0400  BP: 86/38 92/43 99/45    Pulse: 105 103 105   Temp:    98.8 F (37.1 C)  TempSrc:    Oral  Resp: 18 21 21    Height:      Weight:    97.7 kg (215 lb 6.2 oz)  SpO2: 98% 99% 97%     Intake/Output Summary (Last 24 hours) at 05/09/13 0810 Last data filed at 05/09/13 0700  Gross per 24 hour  Intake 3309.15 ml  Output   1850 ml  Net 1459.15 ml   Filed Weights   05/08/13 1126 05/09/13 0400  Weight: 98.884 kg (218 lb) 97.7 kg (215 lb 6.2 oz)    Exam:  General: Alert, awake, oriented x3, in no acute distress.  HEENT: No bruits, no goiter.  Heart: Regular rate and rhythm, without murmurs, rubs, gallops.  Lungs: Good air movement, clear to auscultation. Abdomen: Soft, nontender, nondistended, positive bowel sounds.  Neuro: Grossly intact, nonfocal.   Data Reviewed: Basic Metabolic Panel:  Recent Labs Lab 05/08/13 1950 05/08/13 2155 05/09/13 0153 05/09/13 0356 05/09/13 0620  NA 138 137 140 140 138  K 4.3 3.9 4.1 3.8 4.0  CL 109 111 112 109 109  CO2 13* 16* 16* 16* 16*  GLUCOSE 260* 203* 114* 122* 191*  BUN 26* 25* 25* 25* 24*  CREATININE 0.85 0.83 0.72 0.72 0.71  CALCIUM 8.1* 8.1* 8.4 8.4 8.0*   Liver Function Tests: No results found for this basename: AST, ALT, ALKPHOS, BILITOT, PROT, ALBUMIN,  in the last 168 hours No results found for this  basename: LIPASE, AMYLASE,  in the last 168 hours No results found for this basename: AMMONIA,  in the last 168 hours CBC:  Recent Labs Lab 05/08/13 1309 05/08/13 1622 05/09/13 0153  WBC 25.3* 24.8* 18.5*  NEUTROABS 22.3*  --   --   HGB 14.6 13.5 12.8  HCT 44.2 41.1 38.0  MCV 96.1 95.6 93.1  PLT 415* 361 325   Cardiac Enzymes:  Recent Labs Lab 05/08/13 1309 05/08/13 1950 05/09/13 0153  TROPONINI <0.30 <0.30 <0.30   BNP (last 3 results) No results found for this basename: PROBNP,  in the last 8760 hours CBG:  Recent Labs Lab 05/09/13 0306 05/09/13 0413 05/09/13 0515 05/09/13 0617 05/09/13 0705  GLUCAP 106* 127* 142* 161* 159*    Recent Results (from the past 240 hour(s))  MRSA PCR SCREENING     Status: None   Collection Time    05/08/13  5:13 PM      Result Value Range Status   MRSA by PCR NEGATIVE  NEGATIVE Final   Comment:            The GeneXpert MRSA Assay (FDA     approved for NASAL specimens     only), is one component of a     comprehensive MRSA colonization     surveillance  program. It is not     intended to diagnose MRSA     infection nor to guide or     monitor treatment for     MRSA infections.     Performed at [facility]     Studies: Dg Chest 2 View  05/08/2013   *RADIOLOGY REPORT*  Clinical Data: Chest pain and vomiting.  CHEST - 2 VIEW  Comparison:  09/24/2012  Findings:  The heart size and mediastinal contours are within normal limits.  Both lungs are clear.  The visualized skeletal structures are unremarkable.  IMPRESSION: No active cardiopulmonary disease.   Original Report Authenticated By: Richarda Overlie, M.D.    Scheduled Meds: . sodium chloride   Intravenous STAT  . enoxaparin (LOVENOX) injection  40 mg Subcutaneous Q24H  . nicotine  7 mg Transdermal Daily   Continuous Infusions: . sodium chloride 125 mL/hr at 05/08/13 1524  . sodium chloride Stopped (05/08/13 2104)  . dextrose 5 % and 0.45% NaCl 75 mL/hr at 05/09/13 0404   . insulin (NOVOLIN-R) infusion 1 mL/hr at 05/09/13 0700     Marinda Elk  [facility] Pager [phone]. If 8PM-8AM, please contact night-coverage at [email or web address], password [facility] 05/09/2013, 8:10 AM  LOS: 1 day

## 2013-05-09 NOTE — Progress Notes (Signed)
VASCULAR LAB PRELIMINARY  PRELIMINARY  PRELIMINARY  PRELIMINARY  Bilateral lower extremity venous duplex completed.    Preliminary report:  Bilateral:  No evidence of DVT, superficial thrombosis, or Baker's Cyst.   Tyse Auriemma, RVS 05/09/2013, 9:01 AM

## 2013-05-09 NOTE — Care Management Note (Signed)
   CARE MANAGEMENT NOTE 05/09/2013  Patient:  [name], [name]   Account Number:  [id number]  Date Initiated:  05/09/2013  Documentation initiated by:  Lashena Signer  Subjective/Objective Assessment:   32 yo female admitted with  DKA.     Action/Plan:   Home when stable   Anticipated DC Date:     Anticipated DC Plan:  HOME/SELF CARE      DC Planning Services  CM consult      Choice offered to / List presented to:  NA   DME arranged  NA      DME agency  NA     HH arranged  NA      HH agency  NA   Status of service:  In process, will continue to follow Medicare Important Message given?   (If response is "NO", the following Medicare IM given date fields will be blank) Date Medicare IM given:   Date Additional Medicare IM given:    Discharge Disposition:    Per UR Regulation:  Reviewed for med. necessity/level of care/duration of stay  If discussed at Long Length of Stay Meetings, dates discussed:    Comments:  05/09/13 1459 Chart reviewed for utilization of services. No needs identified at this time. PCP: Cala Bradford, MD

## 2013-05-09 NOTE — Progress Notes (Signed)
Inpatient Diabetes Program Recommendations  AACE/ADA: New Consensus Statement on Inpatient Glycemic Control (2013)  Target Ranges:  Prepandial:   less than 140 mg/dL      Peak postprandial:   less than 180 mg/dL (1-2 hours)      Critically ill patients:  140 - 180 mg/dL     **Patient admitted with DKA.  Uses insulin pump at home to control her CBGs.  Given IVF and started on IV insulin drip.    **Patient remains on IV insulin drip at this time.  Last CO2 on BMET at 8am was only 17.  Patient eating PO diet now and RNs covering patient's carbohydrate intake with IV insulin boluses per GlucoStabilizer protocol/procedures.  Once patient's CO2 has normalized and anion gap has closed, can transition to Lantus and Novolog.  Please make sure to give patient Lantus 1-2 hours before insulin d/c'd.  **Spoke with patient about her insulin pump.  Patient told me she sees Dr. Talmage Nap at [facility].  Last saw Dr. Talmage Nap on 05/04/13  (last Wednesday).  Patient also told me she saw Jenita Seashore, CDE with the [facility] on 04/13/13.  Patient told me she reviewed different basal settings with Ms. Gaylyn Rong and also reviewed other important DM concepts with Ms. Gaylyn Rong as well.  Has a follow-up appt with Ms. Gaylyn Rong sometime in September.  Patient is currently enrolled in the [facility] [employer] wellness program.  **Patient went on to tell me that she has had trouble with this particular insulin pump before.  Encouraged patient to call the 1-800# for the pump manufacturer (located on the back of the pump) to troubleshoot and look for a cause of malfunction.  Do not recommend we send patient home on her insulin pump if there is a questionable malfunction of the pump.  Patient told me she has used Lantus and Novolog before, and does not have a problem going home with injections until she can follow up with her endocrinologist.    **Based on her current insulin pump  settings, patient will likely need 42 units of Lantus when she transitions off the IV insulin drip (gets about 42 units of insulin for a basal rate on her insulin pump per 24 hour period).  Also recommend Novolog Sensitive SSI plus Novolog 6 units tid with meals (as meal coverage).  **If decision made to send patient home on injections instead of her pump, please give patient Rxs for the following: 1. Lantus vial 2. Novolog vial 3. Insulin syringes (1/2 cc syringes)   Will follow. Ambrose Finland RN, MSN, CDE Diabetes Coordinator Inpatient Diabetes Program [phone]

## 2013-05-10 LAB — BASIC METABOLIC PANEL
Chloride: 107 mEq/L (ref 96–112)
GFR calc Af Amer: >90 mL/min (ref >90)
Potassium: 3.5 mEq/L (ref 3.5–5.1)

## 2013-05-10 LAB — GLUCOSE, CAPILLARY
Glucose-Capillary: 177 mg/dL — ABNORMAL HIGH (ref 70–99)
Glucose-Capillary: 171 mg/dL — ABNORMAL HIGH (ref 70–99)
Glucose-Capillary: 140 mg/dL — ABNORMAL HIGH (ref 70–99)

## 2013-05-10 MED ORDER — GLUCOSE 40 % PO GEL: 1.0000 | ORAL | Status: DC | PRN

## 2013-05-10 MED ORDER — INSULIN ASPART 100 UNIT/ML ~~LOC~~ SOLN: 0.0000 [IU] | Freq: Every day | SUBCUTANEOUS | Status: DC

## 2013-05-10 MED ORDER — INSULIN GLARGINE 100 UNIT/ML ~~LOC~~ SOLN
15.0000 [IU] | Freq: Every day | SUBCUTANEOUS | Status: DC
  Administered 2013-05-10: 15 [IU] via SUBCUTANEOUS
  Filled 2013-05-10: qty 0.15

## 2013-05-10 MED ORDER — INSULIN ASPART 100 UNIT/ML ~~LOC~~ SOLN
0.0000 [IU] | Freq: Three times a day (TID) | SUBCUTANEOUS | Status: DC
  Administered 2013-05-10: 8 [IU] via SUBCUTANEOUS

## 2013-05-10 MED ORDER — INSULIN ASPART 100 UNIT/ML ~~LOC~~ SOLN: 8.0000 [IU] | Freq: Three times a day (TID) | SUBCUTANEOUS | Status: DC

## 2013-05-10 MED ORDER — DEXTROSE 50 % IV SOLN: 25.0000 mL | INTRAVENOUS | Status: DC | PRN

## 2013-05-10 MED ORDER — INSULIN GLARGINE 100 UNIT/ML ~~LOC~~ SOLN
15.0000 [IU] | Freq: Once | SUBCUTANEOUS | Status: AC
  Administered 2013-05-10: 15 [IU] via SUBCUTANEOUS
  Filled 2013-05-10: qty 0.15

## 2013-05-10 MED ORDER — INSULIN GLARGINE 100 UNIT/ML ~~LOC~~ SOLN: 35.0000 [IU] | Freq: Every day | SUBCUTANEOUS | Status: DC

## 2013-05-10 MED ORDER — "INSULIN SYRINGE 31G X 5/16"" 0.3 ML MISC": 1.0000 | Freq: Three times a day (TID) | Status: DC

## 2013-05-10 MED ORDER — DEXTROSE 50 % IV SOLN: 50.0000 mL | INTRAVENOUS | Status: DC | PRN

## 2013-05-10 NOTE — Progress Notes (Signed)
Pt ready for d/c. This RN went over d/c instructions with pt. Instructed to make a follow-up appointment in 1 week. Instructed to pick up insulin on way home. PIV removed, WNL. No change in pt condition since AM assessment. Eugene Garnet RN

## 2013-05-10 NOTE — Discharge Summary (Signed)
Physician Discharge Summary  KORRI ASK AVW:[id number] DOB: 05/03/1981 DOA: 05/08/2013  PCP: Cala Bradford, MD  Admit date: 05/08/2013 Discharge date: 05/10/2013  Time spent: 35 minutes  Recommendations for Outpatient Follow-up:  1. Follow up with PCP in 2 weeks  Discharge Diagnoses:  Principal Problem:   DKA, type 1 Active Problems:   DIABETES MELLITUS, TYPE I   HYPONATREMIA   SMOKER   Discharge Condition: stable  Diet recommendation: carb modified  Filed Weights   05/08/13 1126 05/09/13 0400 05/09/13 1012  Weight: 98.884 kg (218 lb) 97.7 kg (215 lb 6.2 oz) 97.523 kg (215 lb)    History of present illness:  32 y.o. female has a past medical history significant for insulin-dependent type 1 diabetes mellitus, tobacco abuse, reflux disease, presents with a chief complaint of epigastric pain nausea and vomiting of sudden onset this morning as well as chest pain. Patient is a type I diabetic and she is on insulin pump that was initiated 2 years ago. Prior to the insulin pump she had episodes of DKA, however none in the last 2 years. She has a [employer] pump which was changed about 6 months ago. Her PCP is Dr. Jennette Kettle and her endocrinologist is Dr. Talmage Nap. She noticed yesterday that her sugars have been a little bit higher than normal at 258, and she changed her catheter at that time worried that he might be kinked. When she woke up this morning, her symptoms are so severe that she decided to come to the emergency room since the demise of her upper previous DKA episodes. Her basal insulin rates in the pump is 2.05 from 8 PM to 9 AM and 1.45 from 9 AM to 8 PM. Her carb ratio is 1 to 5A she uses a Transport planner. She is not sure what her insulin sensitivity is. She endorses feeling chills starting this morning, but yesterday she felt normal. She denies any diarrhea. She denies any shortness of breath or cough or sputum production. She has no ear pain or ear discharges or sinus tenderness. She does  endorse that couple weeks ago she noticed that the right calf was more swollen than the left and she endorses pain with walking. The pain has improved, however her swelling has persisted. She has no burning with urination or increased frequency. In the emergency room, labs were evident for severe DKA and [facility] was consulted for admission.   Hospital Course:  *DKA, type 1/ DIABETES MELLITUS, TYPE I:  - pt relates her pump has been having malfunction for the last months.  - Started empirically on IV insulin, IIV insulin cont until HCO3 still >20 - CBG's Q1 hrs b-met Q 4hrs.  - she was changed to lantus plus novolog with meals.  HYPONATREMIA:  - pseudo resolved.   SMOKER  - nicotine patch.   Procedures:  none   Consultations:  none  Discharge Exam: Filed Vitals:   05/10/13 0631  BP: 114/76  Pulse: 91  Temp: 98.3 F (36.8 C)  Resp: 16    General: A&O x3 Cardiovascular: RRR Respiratory: good air movement  Discharge Instructions  Discharge Orders   Future Appointments Provider Department Dept Phone   06/08/2013 4:00 PM Sylvester Harder [place], [place] [facility] [phone]   Future Orders Complete By Expires   Diet - low sodium heart healthy  As directed    Increase activity slowly  As directed        Medication List    STOP  taking these medications       insulin lispro 100 UNIT/ML injection  Commonly known as:  HUMALOG      TAKE these medications       EPINEPHrine 0.3 mg/0.3 mL Devi  Commonly known as:  EPI-PEN  Inject 0.3 mLs (0.3 mg total) into the muscle once.     GLUCAGEN 1 MG Solr injection  Generic drug:  glucagon  Inject 1 mg into the vein once as needed (severe hypoglycemia).     insulin aspart 100 UNIT/ML injection  Commonly known as:  NOVOLOG  Inject 8 Units into the skin 3 (three) times daily with meals.     insulin glargine 100 UNIT/ML injection  Commonly known as:  LANTUS  Inject 0.35 mL (35  Units total) into the skin daily.     INSULIN SYRINGE .3CC/31GX5/16" 31G X 5/16" 0.3 ML Misc  1 Container by Does not apply route 3 (three) times daily.       Allergies  Allergen Reactions  . Latex Hives, Shortness Of Breath and Rash  . Levofloxacin Shortness Of Breath and Rash  . Moxifloxacin Shortness Of Breath and Rash  . Oxycodone-Acetaminophen Shortness Of Breath, Swelling and Rash    NORCO/VICODIN OK  . Peach [Prunus Persica] Hives and Shortness Of Breath  . Penicillins Swelling and Rash  . Propoxyphene-Acetaminophen Swelling    SWELLING OF FACE AND THROAT  . Rosiglitazone Maleate Swelling    SWELLING OF FACE AND LEGS  . Xolair [Omalizumab]     Rash and anaphyllaxis  . Adhesive [Tape] Hives, Itching and Rash  . Potassium-Containing Compounds Other (See Comments)    IV ROUTE - CAUSES VEINS TO COLLAPSE  . Prednisone Other (See Comments)    SEVERE ELEVATION OF BLOOD SUGAR. Able to tolerate 40 mg  . Morphine And Related Other (See Comments)    Causes hallucinations  . Cefaclor Rash  . Keflex [Cephalexin] Diarrhea and Rash  . Promethazine Hcl Other (See Comments)    IV ROUTE ONLY - JITTERY FEELING. Patient reports that it is mild and she has used promethazine since then  . Sulfadiazine Rash       Follow-up Information   Follow up with Cala Bradford, MD.   Specialty:  Family Medicine   Contact information:   [address] [place] [address] [phone]        The results of significant diagnostics from this hospitalization (including imaging, microbiology, ancillary and laboratory) are listed below for reference.    Significant Diagnostic Studies: Dg Chest 2 View  05/08/2013   *RADIOLOGY REPORT*  Clinical Data: Chest pain and vomiting.  CHEST - 2 VIEW  Comparison:  09/24/2012  Findings:  The heart size and mediastinal contours are within normal limits.  Both lungs are clear.  The visualized skeletal structures are unremarkable.  IMPRESSION: No active  cardiopulmonary disease.   Original Report Authenticated By: Richarda Overlie, M.D.    Microbiology: Recent Results (from the past 240 hour(s))  MRSA PCR SCREENING     Status: None   Collection Time    05/08/13  5:13 PM      Result Value Range Status   MRSA by PCR NEGATIVE  NEGATIVE Final   Comment:            The GeneXpert MRSA Assay (FDA     approved for NASAL specimens     only), is one component of a     comprehensive MRSA colonization     surveillance program. It  is not     intended to diagnose MRSA     infection nor to guide or     monitor treatment for     MRSA infections.     Performed at [facility]     Labs: Basic Metabolic Panel:  Recent Labs Lab 05/09/13 0807 05/09/13 1332 05/09/13 1639 05/09/13 2046 05/10/13 0100  NA 137 135 136 135 135  K 4.0 3.1* 3.2* 3.6 3.5  CL 107 104 105 104 107  CO2 17* 20 20 21 21   GLUCOSE 210* 234* 198* 189* 195*  BUN 24* 22 20 18 13   CREATININE 0.72 0.66 0.65 0.60 0.54  CALCIUM 7.9* 8.4 8.4 8.2* 8.2*   Liver Function Tests: No results found for this basename: AST, ALT, ALKPHOS, BILITOT, PROT, ALBUMIN,  in the last 168 hours No results found for this basename: LIPASE, AMYLASE,  in the last 168 hours No results found for this basename: AMMONIA,  in the last 168 hours CBC:  Recent Labs Lab 05/08/13 1309 05/08/13 1622 05/09/13 0153  WBC 25.3* 24.8* 18.5*  NEUTROABS 22.3*  --   --   HGB 14.6 13.5 12.8  HCT 44.2 41.1 38.0  MCV 96.1 95.6 93.1  PLT 415* 361 325   Cardiac Enzymes:  Recent Labs Lab 05/08/13 1309 05/08/13 1950 05/09/13 0153  TROPONINI <0.30 <0.30 <0.30   BNP: BNP (last 3 results) No results found for this basename: PROBNP,  in the last 8760 hours CBG:  Recent Labs Lab 05/10/13 0014 05/10/13 0118 05/10/13 0203 05/10/13 0317 05/10/13 0735  GLUCAP 177* 177* 171* 140* 255*       Signed:  Marinda Elk  [facility] 05/10/2013, 11:49 AM

## 2013-05-10 NOTE — Progress Notes (Signed)
Met with patient at bedside. She is currently enrolled in the [employer] program for DM management for [employer] employees with [employer]. She follows up with the pharmacist with [employer] as an outpatient. Reports she will go home on injections rather than insulin pump. Her next appointment is 8/29 with the pharmacist. States she wants to see if that appointment can be moved up as well. Left contact information at bedside. Confirmed best contact number for her as well. Will receive a post hospital follow up call.  Raiford Noble, MSN-Ed, RN, BSN- [facility] [phone]

## 2013-05-20 ENCOUNTER — Ambulatory Visit (INDEPENDENT_AMBULATORY_CARE_PROVIDER_SITE_OTHER): Payer: Self-pay | Admitting: Family Medicine

## 2013-05-20 VITALS — BP 111/80 | HR 87 | Ht 65.0 in | Wt 215.0 lb

## 2013-05-20 DIAGNOSIS — E119 Type 2 diabetes mellitus without complications: Secondary | ICD-10-CM

## 2013-05-20 NOTE — Progress Notes (Signed)
Subjective:  Patient presents today for 6 week diabetes follow-up as part of the employer-sponsored [employer] to [employer] program. Current diabetes regimen includes Lantus and Novolog injections.   Last visit with Talmage Nap was shortly before she went into DKA (August 13th). She was hospitalized for DKA August 17th through 19th at [facility]. DKA resolved and she was sent home on Lantus and Novolog and she is no longer using her pump. She thinks that her pump wasn't working. The day before she was admitted to the hospital she wasn't feeling good and her blood sugars were elevated. She changed her pump site and she reports that when she refilled her insulin cartridge insulin was coming out of the tubing so she knows that the pump was capable of delivering insulin. She has not contacted [employer] yet, but states she will do so today.    She is following up with Dr. Talmage Nap via phone. She is sending her CBGs and Dr. Talmage Nap is titrating her insulin over the phone. Her next in person meeting with Dr. Talmage Nap is October 8th.   Prior to going into DKA she met with the RD at the [facility] for further pump education. She returns to see the RD on September 17th to go over nutrition information.   She also lost her blood glucose meter during her admission. The pharmacy was able to get her a new meter and she brings it with her to her appointment today.   She reports that conditions at work have improved. She is getting a lunch every day and she is not quite as busy.    Disease Assessments:  Diabetes:  Type of Diabetes: Type 1; checks feet daily; uses glucometer; takes medications as prescribed; does not take an aspirin a day; Year of diagnosis 1999; Sees Diabetes provider 3 times per year; MD managing Diabetes Dr. Talmage Nap; hypoglycemia frequency often; checks blood glucose 3-4 times a day;   7 day CBG average 178; 14 day CBG average 185; 30 day CBG average 185; Highest CBG 341; Lowest CBG 42;   Other Diabetes History:  She reports that she is checking at each meal and she is supposed to be checking 2 hours after a meal. She doesn't always check after a meal but she is always checking before each meal. This is a big improvement from her last appointment with me when she had 5 total CBG checks for the past 4-6 weeks.   She has only had her meter for the past 10 days or so. In 7 days, 22 checks, in 14 days, 34 checks.   Lantus for now is at a set dose of 35 units once daily in the morning.  Novolog is sliding scale plus a correction factor for what she is eating. Correction factor right now is 1:12, carb ratio is 1 unit for every 7 grams. Target premeal CBG is 140. Lately her meal time doses have been between 4 units and 24 units.   She is still having some hypoglycemia. She reports that yesterday she dropped to a CBG of 50 after lunch (she ate about 30 grams of CHO, gave 4 units Novolog, premeal CBG was 131). She corrected this with an apple. She reports that she is going low 1-2 times a day. On meter review, she is mainly going low in the afternoons after lunch. We discussed how she was calculating her insulin dose and also how she is counting carbohydrates. Based on the meals we reviewed she is counting carbohydrates appropriately. She  is also now measuring the food she is eating and keep a set of measuring cups at work.   Based on low blood sugars in the afternoon I think that her lunch time carb ratio and correction factor could be changed. We discussed aiming for a carb ratio of 1:10 at lunch instead of 1:7, and also changing the correction factor to 1 unit for every 15 or 20 mg/dL of glucose. I strongly encouraged her to consistently check a CBG 2 hours after a meal or at least at some point in the afternoon to assess for hypoglycemia.   She is keeping sources of food with her and at work- fruit, crackers, vanilla wafers, cheese, PB because she was going low. She voiced understanding of how to correct for  hypoglycemia. At work she has juice or sugar packets to correct for a low CBG.    Physical Activity- She has been walking with her daughter. She tore a muscle near her shin and lately she hasn't been walking as much. It tore 5 weeks ago. She reports that it is healed now and she is walking in the afternoons. 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   Nutrition- She is now eating lunch and she is also cooking more meals at home. She had one visit with the dietitian and she has another one with the RD later next month. She is also preparing her breakfast the night before.       Assessment/Plan: Patient is a 32 year old female with DM1, recently admitted for DKA. A1C was not performed in the hospital and the last A1C I have on record is 11.2% which is above goal of less than 7%.   Since her admission for DKA, patient seems to be taking better care of herself and is also doing more to manage her blood sugars. She is now checking her blood sugar consistently at least three times daily. She reports that since she is back on injections she feels like she has to check her CBG and she can't use the pump as a crutch. She does not want to go back to using the pump and wants to continue with the Lantus + Novolog routine. I congratulated patient with her success in checking her CBG and encouraged her to continue taking a more active role in managing her DM. She also has moved in with her boyfriend which I believe has overall contributed to her eating better and checking CBG more often.   Patient is experiencing hypoglycemia most days of the week, mainly in the afternoon. We discussed cutting back on her lunch time insulin dose by easing the correction factor and also changing the carb ratio. On meter review she does not have any hypoglycemia around breakfast or dinner, so we decided to only change lunch time Novolog dose.   Since coming home from the hospital she has been smoking 3-5 cigarettes daily.  She is interested in quitting and wants to start wearing the nicotine patch again. She reports that the 14 mg patch was making her dizzy and wants to try the 7 mg patch. I gaver her 1 box of nicotine 7 mg patch.   Patient wants to retake the group DM education classes. I will work with the RN case manager with [facility] to get her signed up for the classes.   Follow up with patient in 4 weeks. Assess for hypoglycemia and check A1C again if not done at her appointment with the [facility].Marland Kitchen  Goals for Next Visit-  1. Contact [employer] this morning to report the issues with your pump.  2. For lunch time, try adjusting your carb ratios and correction factors so that you aren't giving yourself as much insulin. This is to avoid hypoglycemia in the afternoons.  3. GREAT JOB ON CHECKING YOUR BLOOD SUGAR!!!!!!!!!!! Keep it up and make sure you are checking before each meal. Check sometime in the afternoon to watch for hypoglycemia.  4. Start wearing your nicotine patch every day. You can use the 7 mg nicotine patches now.  Next appointment to see me is Friday October 3rd at 4:30 PM.

## 2013-05-31 ENCOUNTER — Other Ambulatory Visit: Payer: Self-pay | Admitting: Internal Medicine

## 2013-05-31 ENCOUNTER — Ambulatory Visit (INDEPENDENT_AMBULATORY_CARE_PROVIDER_SITE_OTHER)
Admission: RE | Admit: 2013-05-31 | Discharge: 2013-05-31 | Disposition: A | Discharge status: Home / Self Care | Payer: 59 | Source: Ambulatory Visit | Attending: Internal Medicine | Admitting: Internal Medicine

## 2013-05-31 DIAGNOSIS — M79672 Pain in left foot: Secondary | ICD-10-CM

## 2013-05-31 DIAGNOSIS — M79609 Pain in unspecified limb: Secondary | ICD-10-CM

## 2013-06-08 ENCOUNTER — Ambulatory Visit: Payer: 59 | Admitting: *Deleted

## 2013-06-24 ENCOUNTER — Ambulatory Visit (INDEPENDENT_AMBULATORY_CARE_PROVIDER_SITE_OTHER): Payer: Self-pay | Admitting: Family Medicine

## 2013-06-24 VITALS — BP 126/84 | HR 104 | Ht 65.0 in | Wt 218.6 lb

## 2013-06-24 DIAGNOSIS — E119 Type 2 diabetes mellitus without complications: Secondary | ICD-10-CM

## 2013-06-24 NOTE — Progress Notes (Signed)
Subjective:  Patient presents today for 6 week diabetes follow-up as part of the employer-sponsored Link to Wellness program. Current diabetes regimen includes Lantus and Novolog insulin.  She continues with the Lantus and Novolog pen. She does not want to go back to a pump. She sent her pump back to [facility] but she never heard back from them about what was wrong with the pump.  She denies missing Lantus doses during the week. She states that in the past couple of weeks she has forgotten to take Novolog and states "she felt like crap". She lives with her sister and boyfriend and patient states they both have been helpful in aiding her to remember taking her Novolog.   She has not seen Dr. Talmage Nap since she was in the hospital for DKA in August. Her next appointment with Talmage Nap is in November. She tried to get an appointment for earlier but Dr. Talmage Nap did not want to see her any sooner.   She is on doxycycline right now for an infection- hydroadenitis.      Disease Assessments:  Diabetes:  Type of Diabetes: Type 1; checks feet daily; uses glucometer; takes medications as prescribed; does not take an aspirin a day; Year of diagnosis 1999; Sees Diabetes provider 3 times per year; MD managing Diabetes Dr. Talmage Nap; checks blood glucose 3-4 times a day;   Highest CBG 410; Lowest CBG 53; hypoglycemia frequency infrequently; 7 day CBG average 235; 14 day CBG average 235; 30 day CBG average 242; Other Diabetes History: Correction factor 1:12 Carb ratio- 1:5 or 1:7  She is checking CBG 2-3 times daily. She states that she has two meters, but most of the results are on the meter she has with her today. Her dinner readings are on another meter.   We downloaded patients meter to the [employer] software. Fasting measurements are usually around 250-260. She only has 2 FBG readings below 200. Averages are about 60 points higher than at her last appointment with me.   A1C today was 9.2%.        Tobacco  Assessment:  Smoking Status: Current every day smoker She is not using her nicotine patch. She is back to 15 cigarettes a day. She reports that the stress was too much and she started smoking 15 cigs/day about 3 weeks ago.   She reports that her stress should be decreasing- she contacted HR and her workload has been lessened. She also reports that she is sleeping better.; Last Reviewed: 05/20/2013  Pack-years: 14; smoked for 14; Date quit smoking: 09/10/2011    Physical Activity-  She has not been engaging in physical activity.   Nutrition-   At home she thinks that she is doing well eating at home. She often forgets to pack a lunch and so she ends up eating out fast food for lunch. She reports that her boyfriend is a diabetic and this has been a help to her. Her boyfriend is buying the groceries and is helping with meal planning.    Testing:  Blood Sugar Tests:  Hemoglobin A1c: 9.2 resulted on 06/24/2013    Objective:  Musculoskeletal: feet and toes normal.    Assessment/Plan:  Patient is a 32 year old female with DM1. A1C today was 9.2% which is improved since her last A1C on record with me was 11.2%. She thinks that the last time Dr. Talmage Nap checked her A1C over the summer it was 9.9% but I don't have any documentation to back that up.  We downloaded her meter today. Patient is consistently checking CBG three times daily which is an improvement for her (previously she was hardly checking at all). Patient is no longer experiencing hypogylcemia which is an improvement from last visit. However, her CBG averages are abour 60 points higher than they were at her last visit. Patient states she has not missed any doses of Lantus but does occasionally miss Novolog doses. She is calculating bolus doses based on a correction factor of 1:12 and carb ratios of 1:5 or 1:7. Average fasting CBGs are running 250s-260s. Pre-meal CBGs are running about the same as fasting- 260s. This leads me to believe that  she could benefit from increased basal insulin. I will call Dr. Willeen Cass office on Monday to see if we can increase Lantus to 40 units once daily.   Patient could improve eating habits- she is eating out much more lately. We discussed keeping some frozen meals in the freezer at work for the days that she doesn't have time to pack a lunch. She has stopped exercising in the past few weeks. She stated that she wanted to get started with exercising again and states that she could walk with her daughter in the evenings.   Patient has also increased smoking. She is now up to 15 cigarettes daily. She still expresses a desire to quit, but has a hard time actually wearing the patches. I ordered a box of the 21 mg clear nicotine patch for her and encouraged her to pick it up on Monday.   Follow up with patient in 6-8 weeks. I will fax A1C results to Dr. Talmage Nap..    Goals for Next Visit-  1. Bring in some freezer meals into work and leave them in the freezer so that if catered lunch is cancelled, you have an alternative to fast food. Keep some no sugar added fruit cups and baby carrots to complete your lunch.  2. Great job with checking your blood sugars. Check your dinner blood sugars on your own meter.  3. I will call Dr. Willeen Cass office to see if we can increase your Lantus to 40 units once daily. I will call you on Monday to let you know what to do.  4. Get back into physical activity. Start walking in the evenings with [place].  5. Go to [employer] and pick the patches on Monday. Start using the patches every day.   Next appointment is Friday December 5th at 4:30 PM.

## 2013-07-21 ENCOUNTER — Telehealth: Payer: Self-pay | Admitting: Internal Medicine

## 2013-07-21 MED ORDER — FLUCONAZOLE 150 MG PO TABS: ORAL_TABLET | ORAL | Status: DC

## 2013-07-21 MED ORDER — DOXYCYCLINE HYCLATE 100 MG PO TABS: ORAL_TABLET | ORAL | Status: DC

## 2013-07-21 NOTE — Telephone Encounter (Signed)
Recurrent bacterial skin infection. Asks month doxy w/ diflucan

## 2013-07-28 ENCOUNTER — Other Ambulatory Visit: Payer: Self-pay

## 2013-08-25 ENCOUNTER — Ambulatory Visit (INDEPENDENT_AMBULATORY_CARE_PROVIDER_SITE_OTHER)
Admission: RE | Admit: 2013-08-25 | Discharge: 2013-08-25 | Disposition: A | Discharge status: Home / Self Care | Payer: 59 | Source: Ambulatory Visit | Attending: Internal Medicine | Admitting: Internal Medicine

## 2013-08-25 ENCOUNTER — Other Ambulatory Visit: Payer: Self-pay | Admitting: Internal Medicine

## 2013-08-25 DIAGNOSIS — J209 Acute bronchitis, unspecified: Secondary | ICD-10-CM

## 2013-08-29 ENCOUNTER — Ambulatory Visit (INDEPENDENT_AMBULATORY_CARE_PROVIDER_SITE_OTHER): Payer: Self-pay | Admitting: Family Medicine

## 2013-08-29 VITALS — BP 116/76 | HR 87 | Ht 65.0 in | Wt 195.6 lb

## 2013-08-29 DIAGNOSIS — F17200 Nicotine dependence, unspecified, uncomplicated: Secondary | ICD-10-CM | POA: Insufficient documentation

## 2013-08-29 DIAGNOSIS — E119 Type 2 diabetes mellitus without complications: Secondary | ICD-10-CM

## 2013-08-29 NOTE — Progress Notes (Signed)
Subjective:   Patient presents today for 3 month diabetes follow-up as part of the employer-sponsored Link to Wellness program. Current diabetes regimen includes Lantus 35 units SQ once daily and Humalog sliding scale with meals. She states that she is still happy to be back on the injections instead of the pump because she has to be more responsible for taking care of her blood sugars.   Pending appointment with Talmage Nap and with PCP Dr. Cliffton Asters in January.   She has started taking Doxycycline 100 mg once daily to prevent hydradenitis. She is also taking the Viibryd 40mg  once daily.   She is now engaged to her boyfriend. They are thinking about getting married in September 2015.   She has lost nearly 25 pounds since her last appointment with me. This is partly because she has been trying to lose weight, she has been exercising more. This is also partly because her blood sugars have been elevated- last reading was 11.2% when she checked it in the office (pt works at [employer]).       Disease Assessments:  Diabetes:  Type of Diabetes: Type 1; checks feet daily; uses glucometer; takes medications as prescribed; does not take an aspirin a day; Year of diagnosis 1999; Sees Diabetes provider 3 times per year; MD managing Diabetes Dr. Talmage Nap; hypoglycemia frequency infrequently; checks blood glucose 2-6 times a week;   Lowest CBG 53; 7 day CBG average 247; 14 day CBG average 259; 30 day CBG average 264; Highest CBG 411;   Other Diabetes History: Correction factor 1:12 Carb ratio- 1:5 or 1:7  Patient brought her meter with her to the appointment. Denies hypoglycemia. She is not checking very often. She states that she has been very busy at work and at home and she doesn't take time to check her blood sugar. She states that her manager know she has DM, but her director does not know. She admits that she is under increased stress at work and doesn't feel like she can take the time to step away to check her  blood sugar.   On meter review, she only has checked her blood sugar 8 times in the past 30 days and 4 of these checks were in the past week. She admits that she is not taking the time to take care of herself. She also admits that she had to fight to get the time off to come to this appointment. She wants to go back to sending me weekly blood glucose checks because she states she was more accountable in actually checking her blood sugar.   We discussed being more consistent in checking CBGs when she was at home as well as at work. She agreed to go back to checking four times daily (premeals and at bedtime). This schedule would only necessitate 1 check while she was at work. I encouraged her to tack on CBG check to something that she is doing already, like washing her hands prior to lunch, to help make it into a habit. We discussed her motivations for wanting better glycemic control, including avoidance of long term complications such as retinopathy, nephropathy and being in good health as her daughter ages. I reitterated to patient that she is putting several other priorities in her life above herself and that she needs to be able to take care of herself so that she can better take care of others.   We spent part of the visit reviewing carb counting. A 24 hour recall along with the  carb amounts she estimated for shows that she is consistently overestimating carbohydrate quanities in food. However, because she is not checking CBGs and is usually running hyperglycemic, she has not had any low blood sugars. She requested another visit with Jenita Seashore, RD with the [facility]. I strongly encouraged her to start checking CBGS more frequently so that she can better respond to elevated CBG levels.   She eats breakfast at work usually; lunch is usually at work; dinner is sometimes at home, sometimes eating out.      Physical Activity- She states that she was in two parades this weekend. She has been walking 3-4 times  a week for 30-45 minutes. She is doing some dance videos some during the week too with her daughter.   Nutrition-  24 hour recall B-gravy biscuit from [employer] no sides, diet dr. Reino Kent. She estimated 70 g CHO. When we looked up information on [employer] website, total CHO was 39 g.  L- skipped. She states this is not typical. She was at the parade and ate a fiber bar. Did not cover for fiber bar because she was walking in the parade (90 minutes of walking)  D- went to the mall and ate [place] food. Ate sesame chicken, 1/2 portion of rice, honey glazed chicken, water, some mixed vegetables in the rice (fried rice). She states she took 15 units.   Patient denies drinking sugar sweetened beverages.   She states that she has been trying to lose weight. She states that she has cut back on portion sizes overall.        Hemoglobin A1c: 08/22/2013    Dilated Eye Exam: 02/22/2013  Flu vaccine: 06/22/2013  Foot Exam: 12/01/2012  Other Preventive Care Notes: dentist last visit - pending December 2014  Last Endocrinologist Visit 12/01/2012       Vital Signs:  08/29/2013 5:07 PM (EST) Blood Pressure 116 / 76 mm/HgBMI 32.5; Height 5 ft 5 in; Pulse Rate 87 bpm; Weight 195.6 lbs    Testing:  Blood Sugar Tests:  Hemoglobin A1c: 11.2 At [facility] [address] location resulted on 08/22/2013    Assessment/Plan: Patient is a 32 year old female with DM1. Most recent A1C was 11.2% which is elevated above goal of less than 7%. Patient admits that she has not been paying attention to taking care of diabetes and admits that some days she is just tired of having to check CBGS and give herself insulin. We spent the majority of the visit today developing a plan for her to be able to better take care of herself. Patient admits that she was more consistent checking CBGs when she was sending me a weekly report of her blood sugars. She wants to start doing that again, so I will expect a weekly email on Friday with  her CBGs listed.   Patient continues to smoke. She states at one point she was up to 1 pack per day, but lately has been more consistent with wearing the NRT patch. The days she doesn't wear the patch she is smoking 5 cigarettes daily. I encouraged her to consistently wear the nicotine patch daily and to cut out cigarettes completely.   Patient has lost almost 25 pounds since her last appointment with me. Some of this is intentional and some unintentional. I reminded her that healthy weight loss was 1-2 pounds a week. She denied that she was intentionally running her CBGs high just to lose weight. She is engaging in more consistent physical activity than she  was previously and she admits to cutting back on portion sizes.   Follow up with patient weekly via emails (these will be charted in Care Tracker only). Next in person visit in 8 weeks. I explained to patient that if she does not complete weekly CBG emails to me she will need to come back to see me sooner than 8 weeks..   Goals for Next Visit-  1. Keep wearing the nicotine patches- really try to wear them every day.  2. Start checking your blood sugar again four times during the day. Check when you first wake up, before lunch, before dinner and before bedtime.  3. Make yourself a priority. You are worth it to be in good glycemic control!  4. Start sending your blood sugars to me once a week on Fridays. Incentive to send them each week is letting your go 8 weeks before your next appointment. If I don't get the blood sugars, you will need to come back and see me sooner.   Next appointment to see me is Friday February 13th at 4:30 PM.

## 2013-08-31 ENCOUNTER — Other Ambulatory Visit: Payer: Self-pay | Admitting: Internal Medicine

## 2013-08-31 MED ORDER — CIPROFLOXACIN HCL 500 MG PO TABS: 500.0000 mg | ORAL_TABLET | Freq: Two times a day (BID) | ORAL | Status: DC

## 2013-09-02 ENCOUNTER — Encounter: Payer: Self-pay | Admitting: *Deleted

## 2013-09-05 ENCOUNTER — Encounter (INDEPENDENT_AMBULATORY_CARE_PROVIDER_SITE_OTHER): Payer: Self-pay | Admitting: General Surgery

## 2013-09-05 ENCOUNTER — Ambulatory Visit (INDEPENDENT_AMBULATORY_CARE_PROVIDER_SITE_OTHER): Payer: Commercial Managed Care - PPO | Admitting: Surgery

## 2013-09-05 DIAGNOSIS — N611 Abscess of the breast and nipple: Secondary | ICD-10-CM

## 2013-09-05 DIAGNOSIS — N61 Mastitis without abscess: Secondary | ICD-10-CM

## 2013-09-05 MED ORDER — CLINDAMYCIN PHOSPHATE 1 % EX SOLN: CUTANEOUS | Status: DC

## 2013-09-05 MED ORDER — HYDROCODONE-ACETAMINOPHEN 5-325 MG PO TABS: 1.0000 | ORAL_TABLET | Freq: Four times a day (QID) | ORAL | Status: DC | PRN

## 2013-09-05 NOTE — Progress Notes (Signed)
Subjective:     Patient ID: [name], female   DOB: 08-03-1981, 32 y.o.   MRN: [id number]  HPI This is a patient of mine who presents with a left breast abscess. I have treated her for hidradenitis in the past  Review of Systems     Objective:   Physical Exam On exam, she has a superficial breast abscess with fluctuance at the 6:00 position. We prepped the area Betadine, anesthetized with 1% lidocaine, made an incision, entering the large breast abscess. I then packed the wound with a wet 2 x 2.    Assessment:     Left breast abscess     Plan:     She was changed to doxycycline. I actually wrote her for clindamycin solution for her areas of hidradenitis. I will see her back in 3 days. I also wrote her for hydrocodone

## 2013-09-08 ENCOUNTER — Ambulatory Visit (INDEPENDENT_AMBULATORY_CARE_PROVIDER_SITE_OTHER): Payer: Commercial Managed Care - PPO | Admitting: Surgery

## 2013-09-08 ENCOUNTER — Encounter (INDEPENDENT_AMBULATORY_CARE_PROVIDER_SITE_OTHER): Payer: Self-pay | Admitting: Surgery

## 2013-09-08 VITALS — BP 142/80 | HR 71 | Temp 97.5°F | Resp 16 | Ht 65.0 in | Wt 218.2 lb

## 2013-09-08 DIAGNOSIS — Z09 Encounter for follow-up examination after completed treatment for conditions other than malignant neoplasm: Secondary | ICD-10-CM

## 2013-09-08 NOTE — Progress Notes (Signed)
Subjective:     Patient ID: [name], female   DOB: [birth year], 32 y.o.   MRN: [id number]  HPI She is here for a followup visit status post incision and drainage of a right breast abscess 3 days ago. She still has significant discomfort but is improved  Review of Systems     Objective:   Physical Exam On exam, the erythema and induration of the breasts have resolved. The open wound is clean    Assessment:     Patient stable status post I&D of breast abscess     Plan:     She can stop the packing changes continue watching the wound with soap and water. She will continue her antibiotics. I will see her back in 4 weeks regarding  Her hidradenitis

## 2013-10-03 ENCOUNTER — Ambulatory Visit (INDEPENDENT_AMBULATORY_CARE_PROVIDER_SITE_OTHER): Payer: Commercial Managed Care - PPO | Admitting: Surgery

## 2013-10-03 ENCOUNTER — Encounter (INDEPENDENT_AMBULATORY_CARE_PROVIDER_SITE_OTHER): Payer: Commercial Managed Care - PPO | Admitting: Surgery

## 2013-10-03 ENCOUNTER — Encounter (INDEPENDENT_AMBULATORY_CARE_PROVIDER_SITE_OTHER): Payer: Self-pay | Admitting: Surgery

## 2013-10-03 VITALS — BP 112/80 | HR 78 | Temp 98.1°F | Resp 14 | Ht 65.0 in | Wt 217.2 lb

## 2013-10-03 DIAGNOSIS — N61 Mastitis without abscess: Secondary | ICD-10-CM

## 2013-10-03 DIAGNOSIS — N611 Abscess of the breast and nipple: Secondary | ICD-10-CM

## 2013-10-03 DIAGNOSIS — L732 Hidradenitis suppurativa: Secondary | ICD-10-CM

## 2013-10-03 NOTE — Progress Notes (Signed)
Patient ID: [name], female   DOB: [birth year], 33 y.o.   MRN: [id number] ATTENDING PHYSICIAN NOTE: I have reviewed the chart and agree with the plan as detailed above. Dorcas Mcmurray MD Pager [phone]

## 2013-10-03 NOTE — Progress Notes (Signed)
Subjective:     Patient ID: [name], female   DOB: 1981-09-22, 33 y.o.   MRN: [id number]  HPI She is here for another visit regarding her breast abscess and chronic hidradenitis. Since being on the clindamycin solution, she has had one area flareup on the groin but this has resolved. She still has a slight amount of drainage from the breast abscess  Review of Systems     Objective:   Physical Exam On exam, the breast wound is clean. Her axilla and groins are also stable regarding the hidradenitis    Assessment:     Patient stable with history of left breast abscess and hidradenitis     Plan:     She will continue clindamycin solution. I will see her back in 1 month unless she has a flare

## 2013-10-04 ENCOUNTER — Telehealth: Payer: Self-pay | Admitting: Internal Medicine

## 2013-10-04 ENCOUNTER — Other Ambulatory Visit: Payer: Self-pay | Admitting: Internal Medicine

## 2013-10-04 DIAGNOSIS — L732 Hidradenitis suppurativa: Secondary | ICD-10-CM

## 2013-10-04 MED ORDER — CEFDINIR 300 MG PO CAPS: 300.0000 mg | ORAL_CAPSULE | Freq: Two times a day (BID) | ORAL | Status: DC

## 2013-10-04 NOTE — Telephone Encounter (Signed)
Requests omnicef script for skin infection Done

## 2013-10-05 ENCOUNTER — Ambulatory Visit (INDEPENDENT_AMBULATORY_CARE_PROVIDER_SITE_OTHER): Payer: 59

## 2013-10-05 DIAGNOSIS — L732 Hidradenitis suppurativa: Secondary | ICD-10-CM

## 2013-10-05 LAB — CBC WITH DIFFERENTIAL/PLATELET
BASOS ABS: 0.1 10*3/uL (ref 0.0–0.1)
Basophils Relative: 0.7 % (ref 0.0–3.0)
EOS PCT: 3 % (ref 0.0–5.0)
Eosinophils Absolute: 0.3 10*3/uL (ref 0.0–0.7)
HEMATOCRIT: 44.4 % (ref 36.0–46.0)
HEMOGLOBIN: 15.4 g/dL — AB (ref 12.0–15.0)
LYMPHS PCT: 38 % (ref 12.0–46.0)
Lymphs Abs: 3.3 10*3/uL (ref 0.7–4.0)
MCHC: 34.7 g/dL (ref 30.0–36.0)
MCV: 91.5 fl (ref 78.0–100.0)
MONOS PCT: 4.7 % (ref 3.0–12.0)
Monocytes Absolute: 0.4 10*3/uL (ref 0.1–1.0)
NEUTROS ABS: 4.7 10*3/uL (ref 1.4–7.7)
Neutrophils Relative %: 53.6 % (ref 43.0–77.0)
Platelets: 351 10*3/uL (ref 150.0–400.0)
RBC: 4.85 Mil/uL (ref 3.87–5.11)
RDW: 12.8 % (ref 11.5–14.6)
WBC: 8.7 10*3/uL (ref 4.5–10.5)

## 2013-10-05 LAB — IGE: IGE (IMMUNOGLOBULIN E), SERUM: 293.5 [IU]/mL — AB (ref 0.0–180.0)

## 2013-10-06 LAB — HIV ANTIBODY (ROUTINE TESTING W REFLEX): HIV: NONREACTIVE

## 2013-10-06 LAB — IGG, IGA, IGM
IGA: 138 mg/dL (ref 69–380)
IGG (IMMUNOGLOBIN G), SERUM: 951 mg/dL (ref 690–1700)
IGM, SERUM: 117 mg/dL (ref 52–322)

## 2013-10-08 LAB — OTHER SOLSTAS TEST: DIPHTHERIA ANTIBODY, IGG: 3 [IU]/mL

## 2013-10-12 LAB — COMPLEMENT COMPONENT C1Q: Complement C1Q: 6.2 mg/dL (ref 5.0–8.6)

## 2013-10-26 ENCOUNTER — Other Ambulatory Visit (INDEPENDENT_AMBULATORY_CARE_PROVIDER_SITE_OTHER): Payer: 59

## 2013-10-26 ENCOUNTER — Other Ambulatory Visit: Payer: Self-pay | Admitting: Pulmonary Disease

## 2013-10-26 DIAGNOSIS — R110 Nausea: Secondary | ICD-10-CM

## 2013-10-27 LAB — HCG, QUANTITATIVE, PREGNANCY: hCG, Beta Chain, Quant, S: 0.21 m[IU]/mL

## 2013-11-07 ENCOUNTER — Ambulatory Visit (INDEPENDENT_AMBULATORY_CARE_PROVIDER_SITE_OTHER): Payer: Commercial Managed Care - PPO | Admitting: Surgery

## 2013-11-07 ENCOUNTER — Encounter (INDEPENDENT_AMBULATORY_CARE_PROVIDER_SITE_OTHER): Payer: Self-pay | Admitting: Surgery

## 2013-11-07 DIAGNOSIS — L732 Hidradenitis suppurativa: Secondary | ICD-10-CM

## 2013-11-07 MED ORDER — CLINDAMYCIN HCL 150 MG PO CAPS: 150.0000 mg | ORAL_CAPSULE | Freq: Three times a day (TID) | ORAL | Status: AC

## 2013-11-07 NOTE — Progress Notes (Signed)
Subjective:     Patient ID: [name], female   DOB: 04/03/1981, 33 y.o.   MRN: [id number]  HPI She is here for followup. She has minimal discomfort of the breast. She reports her areas of hidradenitis specifically in the left axilla Are starting to flareup slightly  Review of Systems     Objective:   Physical Exam On exam, there are increasing areas of hidradenitis in the left axilla. The right axilla slightly tender. The left groin is stable    Assessment:     Chronic hidradenitis     Plan:     She will continue the clindamycin solution and I will at clindamycin orally and see her back in approximately 4 weeks. We will then discuss whether or not to proceed with wide excision of any of these areas in the operating room

## 2013-11-18 ENCOUNTER — Telehealth (INDEPENDENT_AMBULATORY_CARE_PROVIDER_SITE_OTHER): Payer: Self-pay

## 2013-11-18 ENCOUNTER — Other Ambulatory Visit (INDEPENDENT_AMBULATORY_CARE_PROVIDER_SITE_OTHER): Payer: Self-pay | Admitting: Surgery

## 2013-11-18 MED ORDER — CLINDAMYCIN HCL 150 MG PO CAPS: 150.0000 mg | ORAL_CAPSULE | Freq: Three times a day (TID) | ORAL | Status: AC

## 2013-11-18 MED ORDER — CLINDAMYCIN PHOSPHATE 1 % EX SOLN: CUTANEOUS | Status: DC

## 2013-11-18 MED ORDER — DOXYCYCLINE HYCLATE 100 MG PO TABS: ORAL_TABLET | ORAL | Status: DC

## 2013-11-18 NOTE — Telephone Encounter (Signed)
Pt called stating she is having a flare in her left axilla of hidradinitis. Pt wanting direction. Reviewed with Dr Ninfa Linden. Per Dr Trevor Mace request pt advised he will send RX for antibiotic to pts pharmacy thru [facility]. Pt will ck with pharmacy later today to see if RX is ready. Pt to call if not improving on antibiotic.

## 2013-11-29 NOTE — Progress Notes (Signed)
Patient ID: Nori C Welchel, female   DOB: 03/14/1981, 32 y.o.   MRN: [id number] ATTENDING PHYSICIAN NOTE: I have reviewed the chart and agree with the plan as detailed above. Quetzalli Clos MD Pager [phone]  

## 2013-11-29 NOTE — Progress Notes (Signed)
Patient ID: [name], female   DOB: 12/13/1980, 32 y.o.   MRN: [id number] ATTENDING PHYSICIAN NOTE: I have reviewed the chart and agree with the plan as detailed above. Tanish Sinkler MD Pager [phone]  

## 2013-12-08 ENCOUNTER — Ambulatory Visit (INDEPENDENT_AMBULATORY_CARE_PROVIDER_SITE_OTHER): Payer: Commercial Managed Care - PPO | Admitting: Surgery

## 2013-12-08 ENCOUNTER — Encounter (INDEPENDENT_AMBULATORY_CARE_PROVIDER_SITE_OTHER): Payer: Self-pay | Admitting: Surgery

## 2013-12-08 VITALS — BP 124/76 | HR 76 | Temp 98.4°F | Resp 16 | Ht 65.0 in | Wt 224.0 lb

## 2013-12-08 DIAGNOSIS — L732 Hidradenitis suppurativa: Secondary | ICD-10-CM

## 2013-12-08 NOTE — Progress Notes (Signed)
Subjective:     Patient ID: [name], female   DOB: Sep 25, 1980, 33 y.o.   MRN: [id number]  HPI She is here for another followup visit regarding her Hidradenitis. She has had flareups in both axilla but the left is worse than the right. She is back on clindamycin both oral and the jail.  Review of Systems     Objective:   Physical Exam On examination, there are multiple areas consistent with hidradenitis in the left axilla which is worse in the right. There are no active draining wound there is some mild erythema on the left    Assessment:     Chronic hidradenitis     Plan:     She was to go ahead and proceed with wide excision of left axilla which I feel is reasonable. We again discussed risks. Surgery will be scheduled

## 2013-12-28 ENCOUNTER — Encounter (HOSPITAL_COMMUNITY): Payer: Self-pay | Admitting: Pharmacy Technician

## 2014-01-04 ENCOUNTER — Encounter (HOSPITAL_COMMUNITY): Payer: Self-pay

## 2014-01-04 ENCOUNTER — Encounter (HOSPITAL_COMMUNITY)
Admission: RE | Admit: 2014-01-04 | Discharge: 2014-01-04 | Disposition: A | Discharge status: Home / Self Care | Payer: 59 | Source: Ambulatory Visit | Attending: Surgery | Admitting: Surgery

## 2014-01-04 DIAGNOSIS — Z01812 Encounter for preprocedural laboratory examination: Secondary | ICD-10-CM | POA: Insufficient documentation

## 2014-01-04 HISTORY — DX: Malignant (primary) neoplasm, unspecified: C80.1

## 2014-01-04 HISTORY — DX: Pneumonia, unspecified organism: J18.9

## 2014-01-04 LAB — BASIC METABOLIC PANEL
BUN: 11 mg/dL (ref 6–23)
CO2: 23 mEq/L (ref 19–32)
Calcium: 9.2 mg/dL (ref 8.4–10.5)
Chloride: 105 mEq/L (ref 96–112)
Creatinine, Ser: 0.58 mg/dL (ref 0.50–1.10)
GFR calc Af Amer: >90 mL/min (ref >90)
GLUCOSE: 195 mg/dL — AB (ref 70–99)
Potassium: 4.7 mEq/L (ref 3.7–5.3)
Sodium: 138 mEq/L (ref 137–147)

## 2014-01-04 LAB — CBC
HEMATOCRIT: 40.9 % (ref 36.0–46.0)
Hemoglobin: 14.2 g/dL (ref 12.0–15.0)
MCH: 32.2 pg (ref 26.0–34.0)
MCHC: 34.7 g/dL (ref 30.0–36.0)
MCV: 92.7 fL (ref 78.0–100.0)
Platelets: 296 10*3/uL (ref 150–400)
RBC: 4.41 MIL/uL (ref 3.87–5.11)
RDW: 12.6 % (ref 11.5–15.5)
WBC: 7.8 10*3/uL (ref 4.0–10.5)

## 2014-01-04 NOTE — Pre-Procedure Instructions (Addendum)
KATERYNA GRANTHAM  01/04/2014   Your procedure is scheduled on:  01/13/14  Report to [facility] short stay admitting at 700 AM.  Call this number if you have problems the morning of surgery: [phone]   Remember:   Do not eat food or drink liquids after midnight.   Take these medicines the morning of surgery with A SIP OF WATER: pain med if needed           NO DIABETIC MED AM OF SURGERY          STOP all herbel meds, nsaids (aleve,naproxen,advil,ibuprofen) 5 days prior to surgery including aspirin, vitamins   Do not wear jewelry, make-up or nail polish.  Do not wear lotions, powders, or perfumes. You may wear deodorant.  Do not shave 48 hours prior to surgery. Men may shave face and neck.  Do not bring valuables to the hospital.  [facility] is not responsible                  for any belongings or valuables.               Contacts, dentures or bridgework may not be worn into surgery.  Leave suitcase in the car. After surgery it may be brought to your room.  For patients admitted to the hospital, discharge time is determined by your                treatment team.               Patients discharged the day of surgery will not be allowed to drive  home.  Name and phone number of your driver:   Special Instructions:  Special Instructions: [place] - Preparing for Surgery  Before surgery, you can play an important role.  Because skin is not sterile, your skin needs to be as free of germs as possible.  You can reduce the number of germs on you skin by washing with CHG (chlorahexidine gluconate) soap before surgery.  CHG is an antiseptic cleaner which kills germs and bonds with the skin to continue killing germs even after washing.  Please DO NOT use if you have an allergy to CHG or antibacterial soaps.  If your skin becomes reddened/irritated stop using the CHG and inform your nurse when you arrive at Short Stay.  Do not shave (including legs and underarms) for at least 48 hours prior  to the first CHG shower.  You may shave your face.  Please follow these instructions carefully:   1.  Shower with CHG Soap the night before surgery and the morning of Surgery.  2.  If you choose to wash your hair, wash your hair first as usual with your normal shampoo.  3.  After you shampoo, rinse your hair and body thoroughly to remove the Shampoo.  4.  Use CHG as you would any other liquid soap.  You can apply chg directly  to the skin and wash gently with scrungie or a clean washcloth.  5.  Apply the CHG Soap to your body ONLY FROM THE NECK DOWN.  Do not use on open wounds or open sores.  Avoid contact with your eyes ears, mouth and genitals (private parts).  Wash genitals (private parts)       with your normal soap.  6.  Wash thoroughly, paying special attention to the area where your surgery will be performed.  7.  Thoroughly rinse your body with warm water from the neck  down.  8.  DO NOT shower/wash with your normal soap after using and rinsing off the CHG Soap.  9.  Pat yourself dry with a clean towel.            10.  Wear clean pajamas.            11.  Place clean sheets on your bed the night of your first shower and do not sleep with pets.  Day of Surgery  Do not apply any lotions/deodorants the morning of surgery.  Please wear clean clothes to the hospital/surgery center.   Please read over the following fact sheets that you were given: Pain Booklet, Coughing and Deep Breathing and Surgical Site Infection Prevention

## 2014-01-04 NOTE — Progress Notes (Signed)
Need serum hcg preop due to preadmit speciman hemolyzed

## 2014-01-12 ENCOUNTER — Other Ambulatory Visit: Payer: Self-pay | Admitting: Internal Medicine

## 2014-01-12 DIAGNOSIS — J309 Allergic rhinitis, unspecified: Secondary | ICD-10-CM

## 2014-01-12 MED ORDER — VANCOMYCIN HCL 10 G IV SOLR
1500.0000 mg | INTRAVENOUS | Status: AC
  Administered 2014-01-13: 1500 mg via INTRAVENOUS
  Filled 2014-01-12: qty 1500

## 2014-01-13 ENCOUNTER — Ambulatory Visit (HOSPITAL_COMMUNITY)
Admission: RE | Admit: 2014-01-13 | Discharge: 2014-01-13 | Disposition: A | Discharge status: Home / Self Care | Payer: 59 | Source: Ambulatory Visit | Attending: Surgery | Admitting: Surgery

## 2014-01-13 ENCOUNTER — Ambulatory Visit (HOSPITAL_COMMUNITY): Payer: 59 | Admitting: Anesthesiology

## 2014-01-13 ENCOUNTER — Encounter (HOSPITAL_COMMUNITY): Payer: 59 | Admitting: Anesthesiology

## 2014-01-13 ENCOUNTER — Encounter (HOSPITAL_COMMUNITY)
Admission: RE | Disposition: A | Discharge status: Home / Self Care | Payer: Self-pay | Source: Ambulatory Visit | Attending: Surgery

## 2014-01-13 ENCOUNTER — Encounter (HOSPITAL_COMMUNITY): Payer: Self-pay | Admitting: *Deleted

## 2014-01-13 DIAGNOSIS — Z794 Long term (current) use of insulin: Secondary | ICD-10-CM | POA: Insufficient documentation

## 2014-01-13 DIAGNOSIS — L732 Hidradenitis suppurativa: Secondary | ICD-10-CM | POA: Insufficient documentation

## 2014-01-13 DIAGNOSIS — E119 Type 2 diabetes mellitus without complications: Secondary | ICD-10-CM | POA: Insufficient documentation

## 2014-01-13 DIAGNOSIS — F3289 Other specified depressive episodes: Secondary | ICD-10-CM | POA: Insufficient documentation

## 2014-01-13 DIAGNOSIS — K219 Gastro-esophageal reflux disease without esophagitis: Secondary | ICD-10-CM | POA: Insufficient documentation

## 2014-01-13 DIAGNOSIS — F17200 Nicotine dependence, unspecified, uncomplicated: Secondary | ICD-10-CM | POA: Insufficient documentation

## 2014-01-13 DIAGNOSIS — F411 Generalized anxiety disorder: Secondary | ICD-10-CM | POA: Insufficient documentation

## 2014-01-13 DIAGNOSIS — R011 Cardiac murmur, unspecified: Secondary | ICD-10-CM | POA: Insufficient documentation

## 2014-01-13 DIAGNOSIS — E282 Polycystic ovarian syndrome: Secondary | ICD-10-CM | POA: Insufficient documentation

## 2014-01-13 DIAGNOSIS — Z9641 Presence of insulin pump (external) (internal): Secondary | ICD-10-CM | POA: Insufficient documentation

## 2014-01-13 DIAGNOSIS — G43909 Migraine, unspecified, not intractable, without status migrainosus: Secondary | ICD-10-CM | POA: Insufficient documentation

## 2014-01-13 DIAGNOSIS — F329 Major depressive disorder, single episode, unspecified: Secondary | ICD-10-CM | POA: Insufficient documentation

## 2014-01-13 HISTORY — PX: HYDRADENITIS EXCISION: SHX5243

## 2014-01-13 LAB — GLUCOSE, CAPILLARY
GLUCOSE-CAPILLARY: 201 mg/dL — AB (ref 70–99)
Glucose-Capillary: 261 mg/dL — ABNORMAL HIGH (ref 70–99)
Glucose-Capillary: 244 mg/dL — ABNORMAL HIGH (ref 70–99)

## 2014-01-13 LAB — HCG, SERUM, QUALITATIVE: PREG SERUM: NEGATIVE

## 2014-01-13 SURGERY — EXCISION, HIDRADENITIS, AXILLA
Anesthesia: General | Laterality: Left

## 2014-01-13 MED ORDER — HYDROCODONE-ACETAMINOPHEN 5-325 MG PO TABS
ORAL_TABLET | ORAL | Status: AC
  Administered 2014-01-13 (×2): 1
  Filled 2014-01-13: qty 1

## 2014-01-13 MED ORDER — HYDROCODONE-ACETAMINOPHEN 5-325 MG PO TABS
ORAL_TABLET | ORAL | Status: AC
  Filled 2014-01-13: qty 1

## 2014-01-13 MED ORDER — BUPIVACAINE-EPINEPHRINE 0.25% -1:200000 IJ SOLN
INTRAMUSCULAR | Status: DC | PRN
  Administered 2014-01-13: 20 mL

## 2014-01-13 MED ORDER — INSULIN ASPART 100 UNIT/ML ~~LOC~~ SOLN
8.0000 [IU] | Freq: Once | SUBCUTANEOUS | Status: AC
  Administered 2014-01-13: 8 [IU] via SUBCUTANEOUS

## 2014-01-13 MED ORDER — KETOROLAC TROMETHAMINE 30 MG/ML IJ SOLN
INTRAMUSCULAR | Status: DC | PRN
  Administered 2014-01-13: 30 mg via INTRAVENOUS

## 2014-01-13 MED ORDER — ONDANSETRON HCL 4 MG/2ML IJ SOLN
INTRAMUSCULAR | Status: AC
  Filled 2014-01-13: qty 2

## 2014-01-13 MED ORDER — LIDOCAINE HCL (CARDIAC) 20 MG/ML IV SOLN
INTRAVENOUS | Status: AC
  Filled 2014-01-13: qty 5

## 2014-01-13 MED ORDER — LIDOCAINE HCL (CARDIAC) 10 MG/ML IV SOLN
INTRAVENOUS | Status: DC | PRN
  Administered 2014-01-13: 80 mg via INTRAVENOUS

## 2014-01-13 MED ORDER — ONDANSETRON HCL 4 MG/2ML IJ SOLN
INTRAMUSCULAR | Status: DC | PRN
  Administered 2014-01-13: 4 mg via INTRAVENOUS

## 2014-01-13 MED ORDER — HYDROMORPHONE HCL PF 1 MG/ML IJ SOLN
INTRAMUSCULAR | Status: AC
  Administered 2014-01-13: 0.5 mg via INTRAVENOUS
  Filled 2014-01-13: qty 1

## 2014-01-13 MED ORDER — 0.9 % SODIUM CHLORIDE (POUR BTL) OPTIME
TOPICAL | Status: DC | PRN
  Administered 2014-01-13: 1000 mL

## 2014-01-13 MED ORDER — MIDAZOLAM HCL 5 MG/5ML IJ SOLN
INTRAMUSCULAR | Status: DC | PRN
  Administered 2014-01-13: 2 mg via INTRAVENOUS

## 2014-01-13 MED ORDER — LACTATED RINGERS IV SOLN
INTRAVENOUS | Status: DC
  Administered 2014-01-13: 10:00:00 via INTRAVENOUS

## 2014-01-13 MED ORDER — ROCURONIUM BROMIDE 50 MG/5ML IV SOLN
INTRAVENOUS | Status: AC
  Filled 2014-01-13: qty 1

## 2014-01-13 MED ORDER — FENTANYL CITRATE 0.05 MG/ML IJ SOLN
INTRAMUSCULAR | Status: DC | PRN
  Administered 2014-01-13 (×2): 100 ug via INTRAVENOUS
  Administered 2014-01-13: 50 ug via INTRAVENOUS

## 2014-01-13 MED ORDER — HYDROMORPHONE HCL PF 1 MG/ML IJ SOLN
0.2500 mg | INTRAMUSCULAR | Status: DC | PRN
  Administered 2014-01-13 (×3): 0.5 mg via INTRAVENOUS

## 2014-01-13 MED ORDER — KETOROLAC TROMETHAMINE 30 MG/ML IJ SOLN: 15.0000 mg | Freq: Once | INTRAMUSCULAR | Status: DC | PRN

## 2014-01-13 MED ORDER — DOXYCYCLINE HYCLATE 100 MG PO TABS: 100.0000 mg | ORAL_TABLET | Freq: Two times a day (BID) | ORAL | Status: DC

## 2014-01-13 MED ORDER — PROPOFOL 10 MG/ML IV BOLUS
INTRAVENOUS | Status: DC | PRN
  Administered 2014-01-13: 200 mg via INTRAVENOUS

## 2014-01-13 MED ORDER — PROMETHAZINE HCL 25 MG/ML IJ SOLN: 6.2500 mg | INTRAMUSCULAR | Status: DC | PRN

## 2014-01-13 MED ORDER — PROPOFOL 10 MG/ML IV BOLUS
INTRAVENOUS | Status: AC
  Filled 2014-01-13: qty 20

## 2014-01-13 MED ORDER — HYDROCODONE-ACETAMINOPHEN 5-325 MG PO TABS: 1.0000 | ORAL_TABLET | ORAL | Status: DC | PRN

## 2014-01-13 MED ORDER — FENTANYL CITRATE 0.05 MG/ML IJ SOLN
INTRAMUSCULAR | Status: AC
  Filled 2014-01-13: qty 5

## 2014-01-13 MED ORDER — MIDAZOLAM HCL 2 MG/2ML IJ SOLN
INTRAMUSCULAR | Status: AC
  Filled 2014-01-13: qty 2

## 2014-01-13 MED ORDER — BUPIVACAINE-EPINEPHRINE (PF) 0.25% -1:200000 IJ SOLN
INTRAMUSCULAR | Status: AC
  Filled 2014-01-13: qty 30

## 2014-01-13 SURGICAL SUPPLY — 40 items
APL SKNCLS STERI-STRIP NONHPOA (GAUZE/BANDAGES/DRESSINGS)
BENZOIN TINCTURE PRP APPL 2/3 (GAUZE/BANDAGES/DRESSINGS) IMPLANT
CANISTER SUCTION 2500CC (MISCELLANEOUS) ×2 IMPLANT
CHLORAPREP W/TINT 26ML (MISCELLANEOUS) ×2 IMPLANT
COVER SURGICAL LIGHT HANDLE (MISCELLANEOUS) ×2 IMPLANT
DECANTER SPIKE VIAL GLASS SM (MISCELLANEOUS) ×2 IMPLANT
DRAPE LAPAROSCOPIC ABDOMINAL (DRAPES) IMPLANT
DRAPE PED LAPAROTOMY (DRAPES) IMPLANT
DRAPE PROXIMA HALF (DRAPES) ×2 IMPLANT
DRAPE UTILITY 15X26 W/TAPE STR (DRAPE) ×4 IMPLANT
ELECT CAUTERY BLADE 6.4 (BLADE) ×2 IMPLANT
ELECT REM PT RETURN 9FT ADLT (ELECTROSURGICAL) ×2
ELECTRODE REM PT RTRN 9FT ADLT (ELECTROSURGICAL) ×1 IMPLANT
GLOVE SURG SIGNA 7.5 PF LTX (GLOVE) ×2 IMPLANT
GOWN STRL REUS W/ TWL LRG LVL3 (GOWN DISPOSABLE) ×1 IMPLANT
GOWN STRL REUS W/ TWL XL LVL3 (GOWN DISPOSABLE) ×1 IMPLANT
GOWN STRL REUS W/TWL LRG LVL3 (GOWN DISPOSABLE) ×2
GOWN STRL REUS W/TWL XL LVL3 (GOWN DISPOSABLE) ×2
KIT BASIN OR (CUSTOM PROCEDURE TRAY) ×2 IMPLANT
KIT ROOM TURNOVER OR (KITS) ×2 IMPLANT
MATRIX SURGICAL PSMX 7X10CM (Tissue) ×1 IMPLANT
MICROMATRIX 1000MG (Tissue) ×2 IMPLANT
NDL HYPO 25GX1X1/2 BEV (NEEDLE) ×1 IMPLANT
NEEDLE HYPO 25GX1X1/2 BEV (NEEDLE) ×2 IMPLANT
NS IRRIG 1000ML POUR BTL (IV SOLUTION) ×2 IMPLANT
PACK GENERAL/GYN (CUSTOM PROCEDURE TRAY) ×2 IMPLANT
PAD ARMBOARD 7.5X6 YLW CONV (MISCELLANEOUS) ×2 IMPLANT
SOLUTION PARTIC MCRMTRX 1000MG (Tissue) IMPLANT
SPECIMEN JAR MEDIUM (MISCELLANEOUS) ×2 IMPLANT
SPONGE GAUZE 4X4 12PLY (GAUZE/BANDAGES/DRESSINGS) IMPLANT
SPONGE GAUZE 4X4 12PLY STER LF (GAUZE/BANDAGES/DRESSINGS) ×1 IMPLANT
STRIP CLOSURE SKIN 1/2X4 (GAUZE/BANDAGES/DRESSINGS) IMPLANT
SUT ETHILON 2 0 FS 18 (SUTURE) ×2 IMPLANT
SUT ETHILON 3 0 FSL (SUTURE) IMPLANT
SUT MNCRL AB 4-0 PS2 18 (SUTURE) IMPLANT
SUT VIC AB 3-0 SH 18 (SUTURE) ×2 IMPLANT
SYR CONTROL 10ML LL (SYRINGE) ×2 IMPLANT
TAPE CLOTH SURG 6X10 WHT LF (GAUZE/BANDAGES/DRESSINGS) ×1 IMPLANT
TOWEL OR 17X24 6PK STRL BLUE (TOWEL DISPOSABLE) ×2 IMPLANT
TOWEL OR 17X26 10 PK STRL BLUE (TOWEL DISPOSABLE) ×2 IMPLANT

## 2014-01-13 NOTE — Anesthesia Procedure Notes (Signed)
Procedure Name: LMA Insertion Date/Time: 01/13/2014 12:44 PM Performed by: Greggory Stallion, Juanito Gonyer L Pre-anesthesia Checklist: Patient identified, Emergency Drugs available, Suction available, Patient being monitored and Timeout performed Patient Re-evaluated:Patient Re-evaluated prior to inductionOxygen Delivery Method: Circle system utilized Preoxygenation: Pre-oxygenation with 100% oxygen Intubation Type: IV induction LMA: LMA inserted LMA Size: 4.0 Number of attempts: 1 Placement Confirmation: positive ETCO2 and breath sounds checked- equal and bilateral Tube secured with: Tape Dental Injury: Teeth and Oropharynx as per pre-operative assessment

## 2014-01-13 NOTE — H&P (Signed)
[name] is an 33 y.o. female.   Chief Complaint: hidradenitis HPI: This is a pleasant female with hidradenitis who has had resections in the past. Today she presents for resection of hidradenitis in her left axilla. She is currently on chronic antibiotics for the trigonitis and has no other complaints.  Past Medical History  Diagnosis Date  . Hx MRSA infection   . Migraine   . Vitamin D insufficiency   . Asthma     as a child  . GERD (gastroesophageal reflux disease)     no current meds.  . Hidradenitis 04/2012    bilat. thighs, left groin - open areas on thighs  . Diabetes mellitus     IDDM, Insulin pump; followed by Dr. [name]  . Abscess of left axilla - PREVOTELLA BIVIA & Staph Coag Neg 07/12/2012    MODERATE PREVOTELLA BIVIA Note: BETA LACTAMASE NEGATIVE    . PCOS (polycystic ovarian syndrome)   . Heart murmur   . Pneumonia     hx  . Depression     hx  . Cancer     skin tag rt breast    Past Surgical History  Procedure Laterality Date  . Wisdom tooth extraction    . Tonsillectomy    . Bunionectomy      left  . Esophagogastroduodenoscopy  11/17/2011    Procedure: ESOPHAGOGASTRODUODENOSCOPY (EGD);  Surgeon: [name], MD;  Location: [facility];  Service: Endoscopy;  Laterality: N/A;  . Eye surgery      exc. stye left eye  . Breast surgery      right lumpectomy  . Cholecystectomy  12/02/2006    lap. chole.  . Dilation and evacuation  02/14/2007  . Hydradenitis excision  04/30/2012    Procedure: EXCISION HYDRADENITIS GROIN;  Surgeon: [name], MD;  Location: [place];  Service: General;  Laterality: Left;  wide excision hidradenitis bilateral thighs and Left groin    No family history on file. Social History:  reports that she has been smoking Cigarettes.  She has a 10.5 pack-year smoking history. She has never used smokeless tobacco. She reports that she does not drink alcohol or use illicit drugs.  Allergies:   Allergies  Allergen Reactions  . Latex Hives, Shortness Of Breath and Rash  . Levofloxacin Shortness Of Breath and Rash  . Moxifloxacin Shortness Of Breath and Rash  . Oxycodone-Acetaminophen Shortness Of Breath, Swelling and Rash    NORCO/VICODIN OK  . Peach [Prunus Persica] Hives and Shortness Of Breath  . Penicillins Swelling and Rash  . Propoxyphene N-Acetaminophen Swelling    SWELLING OF FACE AND THROAT  . Rosiglitazone Maleate Swelling    SWELLING OF FACE AND LEGS  . Xolair [Omalizumab]     Rash and anaphyllaxis  . Adhesive [Tape] Hives, Itching and Rash  . Potassium-Containing Compounds Other (See Comments)    IV ROUTE - CAUSES VEINS TO COLLAPSE  . Prednisone Other (See Comments)    SEVERE ELEVATION OF BLOOD SUGAR. Able to tolerate 40 mg  . Morphine And Related Other (See Comments)    Causes hallucinations  . Cefaclor Rash  . Keflex [Cephalexin] Diarrhea and Rash  . Promethazine Hcl Other (See Comments)    IV ROUTE ONLY - JITTERY FEELING. Patient reports that it is mild and she has used promethazine since then  . Sulfadiazine Rash    Medications Prior to Admission  Medication Sig Dispense Refill  . HYDROcodone-acetaminophen (NORCO) 5-325 MG per tablet  Take 1-2 tablets by mouth every 6 (six) hours as needed.  50 tablet  0  . insulin glargine (LANTUS) 100 UNIT/ML injection Inject 42 Units into the skin every morning.      . insulin lispro (HUMALOG) 100 UNIT/ML injection Inject 4-20 Units into the skin 3 (three) times daily before meals. According to patient's sliding scale      . Insulin Syringe-Needle U-100 (INSULIN SYRINGE .3CC/31GX5/16") 31G X 5/16" 0.3 ML MISC 1 Container by Does not apply route 3 (three) times daily.  30 each  0    No results found for this or any previous visit (from the past 48 hour(s)). No results found.  Review of Systems  All other systems reviewed and are negative.   There were no vitals taken for this visit. Physical Exam   Constitutional: She is oriented to person, place, and time. She appears well-developed and well-nourished. No distress.  HENT:  Head: Normocephalic and atraumatic.  Right Ear: External ear normal.  Left Ear: External ear normal.  Nose: Nose normal.  Mouth/Throat: Oropharynx is clear and moist. No oropharyngeal exudate.  Eyes: Conjunctivae are normal. Pupils are equal, round, and reactive to light. Right eye exhibits no discharge. Left eye exhibits no discharge. No scleral icterus.  Neck: Normal range of motion. Neck supple. No tracheal deviation present.  Cardiovascular: Normal rate, regular rhythm and normal heart sounds.   No murmur heard. Respiratory: Effort normal and breath sounds normal. No respiratory distress. She has no wheezes.  GI: Soft. Bowel sounds are normal. There is no tenderness. There is no rebound.  Neurological: She is alert and oriented to person, place, and time.  Skin: Skin is warm and dry.  Changes consistent with hidradenitis in the bilateral axilla  Psychiatric: Her behavior is normal. Judgment normal.     Assessment/Plan Chronic axillary hidradenitis  Today, the plan will be to proceed with wide excision of the hidradenitis in her left axilla. Again, she understands the risks. These include but are not limited to bleeding, infection, chronic wound drainage, wound breakdown, recurrence, and the fact this is not a curable disease. Surgery is scheduled  [name] 01/13/2014, 8:48 AM

## 2014-01-13 NOTE — Anesthesia Preprocedure Evaluation (Signed)
Anesthesia Evaluation  Patient identified by MRN, date of birth, ID band Patient awake    Reviewed: Allergy & Precautions, H&P , NPO status , Patient's Chart, lab work & pertinent test results  History of Anesthesia Complications Negative for: history of anesthetic complications  Airway Mallampati: II TM Distance: >3 FB Neck ROM: Full    Dental  (+) Teeth Intact   Pulmonary neg sleep apnea, neg COPDCurrent Smoker,  breath sounds clear to auscultation        Cardiovascular negative cardio ROS  Rhythm:Regular     Neuro/Psych  Headaches, PSYCHIATRIC DISORDERS Anxiety    GI/Hepatic negative GI ROS, Neg liver ROS,   Endo/Other  diabetes, Insulin Dependent  Renal/GU      Musculoskeletal   Abdominal   Peds  Hematology negative hematology ROS (+)   Anesthesia Other Findings   Reproductive/Obstetrics                           Anesthesia Physical Anesthesia Plan  ASA: II  Anesthesia Plan: General   Post-op Pain Management:    Induction: Intravenous  Airway Management Planned: LMA  Additional Equipment: None  Intra-op Plan:   Post-operative Plan: Extubation in OR  Informed Consent: I have reviewed the patients History and Physical, chart, labs and discussed the procedure including the risks, benefits and alternatives for the proposed anesthesia with the patient or authorized representative who has indicated his/her understanding and acceptance.   Dental advisory given  Plan Discussed with: CRNA and Surgeon  Anesthesia Plan Comments:         Anesthesia Quick Evaluation

## 2014-01-13 NOTE — Op Note (Addendum)
WIDE EXCISION HIDRADENITIS LEFT AXILLA  Procedure Note  [name] 01/13/2014   Pre-op Diagnosis: left axillary hidradenitis     Post-op Diagnosis: same  Procedure(s): WIDE EXCISION HIDRADENITIS LEFT AXILLA (8 cm) PLACEMENT OF XENOGRAFT  Surgeon(s): [name], MD  Anesthesia: General  Staff:  Circulator: Montel Culver, RN Relief Scrub: Quincy Carnes, RN Scrub Person: Tomma Rakers Sipsis, RN  Estimated Blood Loss: Minimal                         [name]   Date: 01/13/2014  Time: 1:16 PM

## 2014-01-13 NOTE — Discharge Instructions (Signed)
Ok to shower  Expect a lot of drainage from the wound  Ice pack and ibuprofen also for pain  What to eat:  For your first meals, you should eat lightly; only small meals initially.  If you do not have nausea, you may eat larger meals.  Avoid spicy, greasy and heavy food.    General Anesthesia, Adult, Care After  Refer to this sheet in the next few weeks. These instructions provide you with information on caring for yourself after your procedure. Your health care provider may also give you more specific instructions. Your treatment has been planned according to current medical practices, but problems sometimes occur. Call your health care provider if you have any problems or questions after your procedure.  WHAT TO EXPECT AFTER THE PROCEDURE  After the procedure, it is typical to experience:  Sleepiness.  Nausea and vomiting. HOME CARE INSTRUCTIONS  For the first 24 hours after general anesthesia:  Have a responsible person with you.  Do not drive a car. If you are alone, do not take public transportation.  Do not drink alcohol.  Do not take medicine that has not been prescribed by your health care provider.  Do not sign important papers or make important decisions.  You may resume a normal diet and activities as directed by your health care provider.  Change bandages (dressings) as directed.  If you have questions or problems that seem related to general anesthesia, call the hospital and ask for the anesthetist or anesthesiologist on call. SEEK MEDICAL CARE IF:  You have nausea and vomiting that continue the day after anesthesia.  You develop a rash. SEEK IMMEDIATE MEDICAL CARE IF:  You have difficulty breathing.  You have chest pain.  You have any allergic problems. Document Released: 12/15/2000 Document Revised: 05/11/2013 Document Reviewed: 03/24/2013  [facility] Patient Information 2014 [place], [place].

## 2014-01-13 NOTE — Transfer of Care (Signed)
Immediate Anesthesia Transfer of Care Note  Patient: [name]  Procedure(s) Performed: Procedure(s): WIDE EXCISION HIDRADENITIS LEFT AXILLA (Left)  Patient Location: PACU  Anesthesia Type:General  Level of Consciousness: awake, alert  and oriented  Airway & Oxygen Therapy: Patient Spontanous Breathing  Post-op Assessment: Report given to PACU RN and Post -op Vital signs reviewed and stable  Post vital signs: Reviewed and stable  Complications: No apparent anesthesia complications

## 2014-01-14 NOTE — Op Note (Signed)
NAMEFAELYNN, WYNDER NO.:  [id number]  MEDICAL RECORD NO.:  [id number]  LOCATION:  [facility]                         FACILITY:  [facility]  PHYSICIAN:  [name], M.D. DATE OF BIRTH:  01-Jun-1981  DATE OF PROCEDURE:  01/13/2014 DATE OF DISCHARGE:  01/13/2014                              OPERATIVE REPORT   PREOPERATIVE DIAGNOSIS:  Left axillary hidradenitis.  POSTOPERATIVE DIAGNOSIS:  Left axillary hidradenitis.  PROCEDURE: 1. An 8-cm wide excision of left axillary hidradenitis. 2. Placement of xenograft  SURGEON:  [name], M.D.  ANESTHESIA:  General anesthesia with 0.25% Marcaine with epinephrine.  ESTIMATED BLOOD LOSS:  Minimal.  PROCEDURE IN DETAIL:  The patient was brought to the operating room, identified as Clayborne Dana.  She was placed supine on the operating room table.  General anesthesia was induced.  Her left axilla was then prepped and draped in usual sterile fashion.  I performed an 8-cm wide excision with a scalpel on her left axilla excising all the chronic draining sinus tracts from the hidradenitis.  I took this down to the axillary tissue with electrocautery.  I then completed the excision removing the skin and subcutaneous tissue with the electrocautery.  I then mobilized the skin on each side of the incision slightly with the electrocautery.  I anesthetized the wound further with Marcaine.  I then irrigated with saline.  I then placed the xenograft powder and a sheet of A-cell xenograft into the wound.  I then closed subcutaneous tissue with interrupted 3-0 Vicryl sutures and closed the skin with interrupted 2-0 nylon sutures.  Gauze and tape were then applied.  The patient tolerated the procedure well.  All the counts were correct at the end of procedure.  The patient was then extubated in the operating room and taken in stable condition to recovery room.     [name], M.D.     DB/MEDQ  D:  01/13/2014  T:   01/14/2014  Job:  [id number]

## 2014-01-16 ENCOUNTER — Encounter (HOSPITAL_COMMUNITY): Payer: Self-pay | Admitting: Surgery

## 2014-01-16 NOTE — Anesthesia Postprocedure Evaluation (Signed)
  Anesthesia Post-op Note  Patient: [name]  Procedure(s) Performed: Procedure(s): WIDE EXCISION HIDRADENITIS LEFT AXILLA (Left)  Patient Location: PACU  Anesthesia Type:General  Level of Consciousness: awake and alert   Airway and Oxygen Therapy: Patient Spontanous Breathing  Post-op Pain: mild  Post-op Assessment: Post-op Vital signs reviewed, Patient's Cardiovascular Status Stable, Respiratory Function Stable, Patent Airway, No signs of Nausea or vomiting and Pain level controlled  Post-op Vital Signs: Reviewed and stable  Last Vitals:  Filed Vitals:   01/13/14 1615  BP: 98/66  Pulse: 73  Temp:   Resp:     Complications: No apparent anesthesia complications

## 2014-01-19 ENCOUNTER — Ambulatory Visit (INDEPENDENT_AMBULATORY_CARE_PROVIDER_SITE_OTHER): Payer: Commercial Managed Care - PPO | Admitting: Surgery

## 2014-01-19 ENCOUNTER — Encounter (INDEPENDENT_AMBULATORY_CARE_PROVIDER_SITE_OTHER): Payer: Self-pay | Admitting: Surgery

## 2014-01-19 VITALS — BP 120/80 | HR 76 | Temp 98.5°F | Resp 16 | Ht 65.0 in | Wt 229.8 lb

## 2014-01-19 DIAGNOSIS — Z09 Encounter for follow-up examination after completed treatment for conditions other than malignant neoplasm: Secondary | ICD-10-CM

## 2014-01-19 MED ORDER — CLINDAMYCIN HCL 150 MG PO CAPS: 300.0000 mg | ORAL_CAPSULE | Freq: Three times a day (TID) | ORAL | Status: AC

## 2014-01-19 NOTE — Patient Instructions (Signed)
STOP DOXYCYCLINE.  START CLINDAMYCIN 300 MG PO TID. RETURN  NEXT WEEK FOR SUTURE REMOVAL.

## 2014-01-19 NOTE — Progress Notes (Signed)
Patient comes in 6 days after excision of left axillary hidradenitis by Dr. Ninfa Linden. She wished  to have her wound checked. It is quite sore.  Some redness noted ans fever but not sure how high.  Pt had experienced some nausea.  Exam: Filed Vitals:   01/19/14 1515  Height: 5\' 5"  (1.651 m)  Weight: 229 lb 12.8 oz (104.237 kg)   Filed Vitals:   01/19/14 1515  BP: 120/80  Pulse: 76  Temp: 98.5 F (36.9 C)  Resp: 16  Height: 5\' 5"  (1.651 m)  Weight: 229 lb 12.8 oz (104.237 kg)    Left axillary incision intact.  No significant redness or drainage.  No separation     Assessment: 6 days status post wide excision left axillary hidradenitis without signs of overt infection  Plan: We'll switch to clindamycin 3 mg by mouth 3 times a day and stop doxycycline due to nausea. Return next week for wound check.

## 2014-01-25 ENCOUNTER — Ambulatory Visit (INDEPENDENT_AMBULATORY_CARE_PROVIDER_SITE_OTHER): Payer: Commercial Managed Care - PPO | Admitting: Surgery

## 2014-01-25 ENCOUNTER — Encounter (INDEPENDENT_AMBULATORY_CARE_PROVIDER_SITE_OTHER): Payer: Self-pay | Admitting: Surgery

## 2014-01-25 VITALS — BP 127/81 | HR 67 | Temp 97.1°F | Resp 18 | Ht 65.0 in | Wt 227.8 lb

## 2014-01-25 DIAGNOSIS — Z09 Encounter for follow-up examination after completed treatment for conditions other than malignant neoplasm: Secondary | ICD-10-CM

## 2014-01-25 NOTE — Progress Notes (Signed)
Subjective:     Patient ID: [name], female   DOB: 09-Mar-1981, 33 y.o.   MRN: [id number]  HPI She is here for her first postop visit status post wide excision of hidradenitis in her left axilla. She has been having some drainage from the wound but is otherwise okay  Review of Systems     Objective:   Physical Exam There is a large amount of seroma fluid draining from the wound. I removed the sutures and drains trauma and some of the wound broke apart. We then attempted to reclose the wound with Dermabond    Assessment:     Patient stable postop     Plan:     If the wound opens up, she will start wet-to-dry dressing changes. I will see her back in the next several weeks

## 2014-01-27 ENCOUNTER — Emergency Department (HOSPITAL_COMMUNITY)
Admission: EM | Admit: 2014-01-27 | Discharge: 2014-01-27 | Disposition: A | Discharge status: Home / Self Care | Payer: 59 | Attending: Emergency Medicine | Admitting: Emergency Medicine

## 2014-01-27 ENCOUNTER — Encounter (INDEPENDENT_AMBULATORY_CARE_PROVIDER_SITE_OTHER): Payer: Commercial Managed Care - PPO | Admitting: Surgery

## 2014-01-27 DIAGNOSIS — Z8701 Personal history of pneumonia (recurrent): Secondary | ICD-10-CM | POA: Insufficient documentation

## 2014-01-27 DIAGNOSIS — T8131XA Disruption of external operation (surgical) wound, not elsewhere classified, initial encounter: Secondary | ICD-10-CM | POA: Insufficient documentation

## 2014-01-27 DIAGNOSIS — R42 Dizziness and giddiness: Secondary | ICD-10-CM | POA: Insufficient documentation

## 2014-01-27 DIAGNOSIS — Z853 Personal history of malignant neoplasm of breast: Secondary | ICD-10-CM | POA: Insufficient documentation

## 2014-01-27 DIAGNOSIS — T8130XA Disruption of wound, unspecified, initial encounter: Secondary | ICD-10-CM

## 2014-01-27 DIAGNOSIS — R109 Unspecified abdominal pain: Secondary | ICD-10-CM | POA: Insufficient documentation

## 2014-01-27 DIAGNOSIS — Z8679 Personal history of other diseases of the circulatory system: Secondary | ICD-10-CM | POA: Insufficient documentation

## 2014-01-27 DIAGNOSIS — R112 Nausea with vomiting, unspecified: Secondary | ICD-10-CM | POA: Insufficient documentation

## 2014-01-27 DIAGNOSIS — Z880 Allergy status to penicillin: Secondary | ICD-10-CM | POA: Insufficient documentation

## 2014-01-27 DIAGNOSIS — Z792 Long term (current) use of antibiotics: Secondary | ICD-10-CM | POA: Insufficient documentation

## 2014-01-27 DIAGNOSIS — Z8659 Personal history of other mental and behavioral disorders: Secondary | ICD-10-CM | POA: Insufficient documentation

## 2014-01-27 DIAGNOSIS — Y838 Other surgical procedures as the cause of abnormal reaction of the patient, or of later complication, without mention of misadventure at the time of the procedure: Secondary | ICD-10-CM | POA: Insufficient documentation

## 2014-01-27 DIAGNOSIS — R011 Cardiac murmur, unspecified: Secondary | ICD-10-CM | POA: Insufficient documentation

## 2014-01-27 DIAGNOSIS — Z8614 Personal history of Methicillin resistant Staphylococcus aureus infection: Secondary | ICD-10-CM | POA: Insufficient documentation

## 2014-01-27 DIAGNOSIS — E119 Type 2 diabetes mellitus without complications: Secondary | ICD-10-CM | POA: Insufficient documentation

## 2014-01-27 DIAGNOSIS — Z91040 Latex allergy status: Secondary | ICD-10-CM | POA: Insufficient documentation

## 2014-01-27 DIAGNOSIS — Z794 Long term (current) use of insulin: Secondary | ICD-10-CM | POA: Insufficient documentation

## 2014-01-27 DIAGNOSIS — Z3202 Encounter for pregnancy test, result negative: Secondary | ICD-10-CM | POA: Insufficient documentation

## 2014-01-27 DIAGNOSIS — Z8719 Personal history of other diseases of the digestive system: Secondary | ICD-10-CM | POA: Insufficient documentation

## 2014-01-27 DIAGNOSIS — M79609 Pain in unspecified limb: Secondary | ICD-10-CM | POA: Insufficient documentation

## 2014-01-27 DIAGNOSIS — F17200 Nicotine dependence, unspecified, uncomplicated: Secondary | ICD-10-CM | POA: Insufficient documentation

## 2014-01-27 DIAGNOSIS — R739 Hyperglycemia, unspecified: Secondary | ICD-10-CM

## 2014-01-27 DIAGNOSIS — Z8742 Personal history of other diseases of the female genital tract: Secondary | ICD-10-CM | POA: Insufficient documentation

## 2014-01-27 DIAGNOSIS — J45909 Unspecified asthma, uncomplicated: Secondary | ICD-10-CM | POA: Insufficient documentation

## 2014-01-27 DIAGNOSIS — Z872 Personal history of diseases of the skin and subcutaneous tissue: Secondary | ICD-10-CM | POA: Insufficient documentation

## 2014-01-27 LAB — CBC WITH DIFFERENTIAL/PLATELET
BASOS ABS: 0 10*3/uL (ref 0.0–0.1)
Basophils Relative: 1 % (ref 0–1)
EOS ABS: 0.3 10*3/uL (ref 0.0–0.7)
EOS PCT: 4 % (ref 0–5)
HCT: 37.7 % (ref 36.0–46.0)
Hemoglobin: 12.7 g/dL (ref 12.0–15.0)
Lymphocytes Relative: 30 % (ref 12–46)
Lymphs Abs: 2 10*3/uL (ref 0.7–4.0)
MCH: 31.2 pg (ref 26.0–34.0)
MCHC: 33.7 g/dL (ref 30.0–36.0)
MCV: 92.6 fL (ref 78.0–100.0)
Monocytes Absolute: 0.4 10*3/uL (ref 0.1–1.0)
Monocytes Relative: 6 % (ref 3–12)
NEUTROS PCT: 59 % (ref 43–77)
Neutro Abs: 4 10*3/uL (ref 1.7–7.7)
PLATELETS: 290 10*3/uL (ref 150–400)
RBC: 4.07 MIL/uL (ref 3.87–5.11)
RDW: 12.4 % (ref 11.5–15.5)
WBC: 6.7 10*3/uL (ref 4.0–10.5)

## 2014-01-27 LAB — URINALYSIS, ROUTINE W REFLEX MICROSCOPIC
BILIRUBIN URINE: NEGATIVE
Glucose, UA: >1000 mg/dL — AB
Ketones, ur: 40 mg/dL — AB
Nitrite: NEGATIVE
Protein, ur: NEGATIVE mg/dL
SPECIFIC GRAVITY, URINE: 1.03 (ref 1.005–1.030)
UROBILINOGEN UA: 0.2 mg/dL (ref 0.0–1.0)
pH: 5.5 (ref 5.0–8.0)

## 2014-01-27 LAB — I-STAT CG4 LACTIC ACID, ED: Lactic Acid, Venous: 0.64 mmol/L (ref 0.5–2.2)

## 2014-01-27 LAB — I-STAT CHEM 8, ED
BUN: 13 mg/dL (ref 6–23)
CREATININE: 0.7 mg/dL (ref 0.50–1.10)
Calcium, Ion: 1.12 mmol/L (ref 1.12–1.23)
Chloride: 104 mEq/L (ref 96–112)
Glucose, Bld: 295 mg/dL — ABNORMAL HIGH (ref 70–99)
HEMATOCRIT: 39 % (ref 36.0–46.0)
HEMOGLOBIN: 13.3 g/dL (ref 12.0–15.0)
POTASSIUM: 3.7 meq/L (ref 3.7–5.3)
SODIUM: 139 meq/L (ref 137–147)
TCO2: 19 mmol/L (ref 0–100)

## 2014-01-27 LAB — CBG MONITORING, ED
GLUCOSE-CAPILLARY: 421 mg/dL — AB (ref 70–99)
GLUCOSE-CAPILLARY: 307 mg/dL — AB (ref 70–99)
GLUCOSE-CAPILLARY: 221 mg/dL — AB (ref 70–99)
Glucose-Capillary: 463 mg/dL — ABNORMAL HIGH (ref 70–99)
Glucose-Capillary: 350 mg/dL — ABNORMAL HIGH (ref 70–99)

## 2014-01-27 LAB — URINE MICROSCOPIC-ADD ON

## 2014-01-27 LAB — PREGNANCY, URINE: PREG TEST UR: NEGATIVE

## 2014-01-27 LAB — COMPREHENSIVE METABOLIC PANEL
ALT: 10 U/L (ref 0–35)
AST: 10 U/L (ref 0–37)
Albumin: 3.5 g/dL (ref 3.5–5.2)
Alkaline Phosphatase: 80 U/L (ref 39–117)
BUN: 14 mg/dL (ref 6–23)
CO2: 21 mEq/L (ref 19–32)
Calcium: 8.9 mg/dL (ref 8.4–10.5)
Chloride: 101 mEq/L (ref 96–112)
Creatinine, Ser: 0.78 mg/dL (ref 0.50–1.10)
GFR calc Af Amer: >90 mL/min (ref >90)
GFR calc non Af Amer: >90 mL/min (ref >90)
Glucose, Bld: 478 mg/dL — ABNORMAL HIGH (ref 70–99)
Potassium: 4.6 mEq/L (ref 3.7–5.3)
SODIUM: 137 meq/L (ref 137–147)
TOTAL PROTEIN: 6.4 g/dL (ref 6.0–8.3)
Total Bilirubin: 0.4 mg/dL (ref 0.3–1.2)

## 2014-01-27 LAB — I-STAT TROPONIN, ED: Troponin i, poc: 0.01 ng/mL (ref 0.00–0.08)

## 2014-01-27 MED ORDER — ACETAMINOPHEN 325 MG PO TABS
650.0000 mg | ORAL_TABLET | Freq: Once | ORAL | Status: AC
  Administered 2014-01-27: 650 mg via ORAL
  Filled 2014-01-27: qty 2

## 2014-01-27 MED ORDER — INSULIN ASPART 100 UNIT/ML ~~LOC~~ SOLN
5.0000 [IU] | Freq: Once | SUBCUTANEOUS | Status: AC
  Administered 2014-01-27: 5 [IU] via SUBCUTANEOUS
  Filled 2014-01-27: qty 1

## 2014-01-27 MED ORDER — ONDANSETRON HCL 4 MG/2ML IJ SOLN
4.0000 mg | INTRAMUSCULAR | Status: AC
  Administered 2014-01-27: 4 mg via INTRAVENOUS
  Filled 2014-01-27: qty 2

## 2014-01-27 MED ORDER — INSULIN ASPART 100 UNIT/ML ~~LOC~~ SOLN
10.0000 [IU] | Freq: Once | SUBCUTANEOUS | Status: AC
  Administered 2014-01-27: 10 [IU] via SUBCUTANEOUS
  Filled 2014-01-27: qty 1

## 2014-01-27 MED ORDER — SODIUM CHLORIDE 0.9 % IV BOLUS (SEPSIS)
1000.0000 mL | Freq: Once | INTRAVENOUS | Status: AC
  Administered 2014-01-27: 1000 mL via INTRAVENOUS

## 2014-01-27 MED ORDER — FENTANYL CITRATE 0.05 MG/ML IJ SOLN
INTRAMUSCULAR | Status: AC
  Administered 2014-01-27: 50 ug via INTRAVENOUS
  Filled 2014-01-27: qty 2

## 2014-01-27 MED ORDER — FENTANYL CITRATE 0.05 MG/ML IJ SOLN
50.0000 ug | Freq: Once | INTRAMUSCULAR | Status: AC
  Administered 2014-01-27: 50 ug via INTRAVENOUS
  Filled 2014-01-27: qty 2

## 2014-01-27 MED ORDER — HYDROCODONE-ACETAMINOPHEN 5-325 MG PO TABS: 1.0000 | ORAL_TABLET | Freq: Four times a day (QID) | ORAL | Status: DC | PRN

## 2014-01-27 MED ORDER — KETOROLAC TROMETHAMINE 30 MG/ML IJ SOLN
30.0000 mg | Freq: Once | INTRAMUSCULAR | Status: AC
  Administered 2014-01-27: 30 mg via INTRAVENOUS
  Filled 2014-01-27: qty 1

## 2014-01-27 MED ORDER — ONDANSETRON HCL 4 MG PO TABS: 4.0000 mg | ORAL_TABLET | Freq: Four times a day (QID) | ORAL | Status: DC

## 2014-01-27 NOTE — ED Provider Notes (Signed)
CSN: [id number]     Arrival date & time 01/27/14  0802 History   First MD Initiated Contact with Patient 01/27/14 [phone]     Chief Complaint  Patient presents with  . post surgical complication   . Emesis     (Consider location/radiation/quality/duration/timing/severity/associated sxs/prior Treatment) HPI Comments: Patient is a 33 year old female with history of diabetes mellitus, MRSA infection, and PCOS. She is 14 days s/p hidradenitis excision of L axilla by Dr. Ninfa Linden. Patient went for f/u with [facility] 2 days ago at which stitches were removed. Patient states there was a small amount of wound dehiscence at this time which was closed with dermabond. Patient states that she was bumped at work yesterday causing a pulling at her wound and further dehiscence. She states that pain has worsened since this time and wound has been draining large amounts of serous fluid. Pain is nonradiating and worse with LUE movement and palpation. She states she has had worsening nausea and NB/NB emesis x 12, per patient, in the last 24 hours. She states she feels lightheaded intermittently and has had chills. No fever. Max temp PTA 99.21F. No syncope, chest pain, SOB, numbness/tingling, extremity weakness, melena, hematochezia, or urinary symptoms. No red streaking from surgical site or swelling of soft tissue of L axilla. Patient has been taking clindamycin 300 mg 3 times a day with full compliance.  Patient is a 33 y.o. female presenting with vomiting. The history is provided by the patient. No language interpreter was used.  Emesis Associated symptoms: abdominal pain (mild, diffuse) and chills     Past Medical History  Diagnosis Date  . Hx MRSA infection   . Migraine   . Vitamin D insufficiency   . Asthma     as a child  . GERD (gastroesophageal reflux disease)     no current meds.  . Hidradenitis 04/2012    bilat. thighs, left groin - open areas on thighs  . Diabetes mellitus     IDDM, Insulin pump; followed  by Dr. [name]  . Abscess of left axilla - PREVOTELLA BIVIA & Staph Coag Neg 07/12/2012    MODERATE PREVOTELLA BIVIA Note: BETA LACTAMASE NEGATIVE    . PCOS (polycystic ovarian syndrome)   . Heart murmur   . Pneumonia     hx  . Depression     hx  . Cancer     skin tag rt breast   Past Surgical History  Procedure Laterality Date  . Wisdom tooth extraction    . Tonsillectomy    . Bunionectomy      left  . Esophagogastroduodenoscopy  11/17/2011    Procedure: ESOPHAGOGASTRODUODENOSCOPY (EGD);  Surgeon: [name], MD;  Location: [facility];  Service: Endoscopy;  Laterality: N/A;  . Eye surgery      exc. stye left eye  . Breast surgery      right lumpectomy  . Cholecystectomy  12/02/2006    lap. chole.  . Dilation and evacuation  02/14/2007  . Hydradenitis excision  04/30/2012    Procedure: EXCISION HYDRADENITIS GROIN;  Surgeon: [name], MD;  Location: [facility];  Service: General;  Laterality: Left;  wide excision hidradenitis bilateral thighs and Left groin  . Hydradenitis excision Left 01/13/2014    Procedure: WIDE EXCISION HIDRADENITIS LEFT AXILLA;  Surgeon: [name], MD;  Location: [facility];  Service: General;  Laterality: Left;   No family history on file. History  Substance Use Topics  . Smoking status: Current  Some Day Smoker -- 0.75 packs/day for 14 years    Types: Cigarettes  . Smokeless tobacco: Never Used     Comment: 5 cig./day  . Alcohol Use: No   OB History   Grav Para Term Preterm Abortions TAB SAB Ect Mult Living   4    3  3   1      Review of Systems  Constitutional: Positive for chills. Negative for fever.  Eyes: Negative for visual disturbance.  Respiratory: Negative for shortness of breath.   Cardiovascular: Negative for chest pain.  Gastrointestinal: Positive for nausea, vomiting and abdominal pain (mild, diffuse).  Genitourinary: Negative for dysuria and hematuria.  Skin: Positive for wound.   Neurological: Positive for light-headedness. Negative for syncope, weakness and numbness.  All other systems reviewed and are negative.     Allergies  Latex; Levofloxacin; Moxifloxacin; Oxycodone-acetaminophen; Peach; Penicillins; Propoxyphene n-acetaminophen; Rosiglitazone maleate; Xolair; Adhesive; Potassium-containing compounds; Prednisone; Morphine and related; Cefaclor; Keflex; Promethazine hcl; and Sulfadiazine  Home Medications   Prior to Admission medications   Medication Sig Start Date End Date Taking? Authorizing Provider  clindamycin (CLEOCIN) 150 MG capsule Take 2 capsules (300 mg total) by mouth 3 (three) times daily. 01/19/14 01/29/14 Yes Thomas A. Cornett, MD  HYDROcodone-acetaminophen (NORCO) 5-325 MG per tablet Take 1-2 tablets by mouth every 4 (four) hours as needed. 01/13/14  Yes [name], MD  insulin aspart (NOVOLOG) 100 UNIT/ML injection Inject into the skin 3 (three) times daily before meals.   Yes Historical Provider, MD  insulin glargine (LANTUS) 100 UNIT/ML injection Inject 42 Units into the skin every morning. 05/10/13  Yes Charlynne Cousins, MD  Insulin Syringe-Needle U-100 (INSULIN SYRINGE .3CC/31GX5/16") 31G X 5/16" 0.3 ML MISC 1 Container by Does not apply route 3 (three) times daily. 05/10/13   Charlynne Cousins, MD   BP 108/68  Pulse 75  Temp(Src) 98.6 F (37 C) (Oral)  Resp 18  Ht 5\' 5"  (1.651 m)  Wt 227 lb (102.967 kg)  BMI 37.77 kg/m2  SpO2 100%  LMP 01/18/2014  Physical Exam  Nursing note and vitals reviewed. Constitutional: She is oriented to person, place, and time. She appears well-developed and well-nourished. No distress.  Patient appears mildly uncomfortable. She is in no acute distress. Nontoxic/nonseptic appearing.  HENT:  Head: Normocephalic and atraumatic.  Mouth/Throat: Oropharynx is clear and moist. No oropharyngeal exudate.  Eyes: Conjunctivae and EOM are normal. Pupils are equal, round, and reactive to light. No scleral  icterus.  Neck: Normal range of motion.  Cardiovascular: Normal rate, regular rhythm and normal heart sounds.   Pulmonary/Chest: Effort normal. No respiratory distress. She has no wheezes. She has no rales.  Abdominal: Soft. She exhibits no distension. There is tenderness. There is no rebound and no guarding.  Mild diffuse tenderness to deep palpation. No focal tenderness or peritoneal signs.  Musculoskeletal: Normal range of motion. She exhibits tenderness (L axilla).  Neurological: She is alert and oriented to person, place, and time.  GCS 15. Patient moves extremities without ataxia. No gross sensory deficits.  Skin: Skin is warm and dry. No rash noted. She is not diaphoretic. No pallor.  Patient's left axilla reveals surgical incision site with wound dehiscence measuring approximately 6 cm in length. Mild warmth to anterior border of incision site. No marked erythema; no red linear streaking, induration, or purulent drainage. Wound does not appear acutely infected.  Psychiatric: She has a normal mood and affect. Her behavior is normal.    ED Course  Procedures (including critical care time) Labs Review Labs Reviewed  COMPREHENSIVE METABOLIC PANEL - Abnormal; Notable for the following:    Glucose, Bld 478 (*)    All other components within normal limits  URINALYSIS, ROUTINE W REFLEX MICROSCOPIC - Abnormal; Notable for the following:    APPearance HAZY (*)    Glucose, UA >1000 (*)    Hgb urine dipstick SMALL (*)    Ketones, ur 40 (*)    Leukocytes, UA TRACE (*)    All other components within normal limits  URINE MICROSCOPIC-ADD ON - Abnormal; Notable for the following:    Bacteria, UA FEW (*)    All other components within normal limits  CBG MONITORING, ED - Abnormal; Notable for the following:    Glucose-Capillary 463 (*)    All other components within normal limits  CBG MONITORING, ED - Abnormal; Notable for the following:    Glucose-Capillary 421 (*)    All other components  within normal limits  CBG MONITORING, ED - Abnormal; Notable for the following:    Glucose-Capillary 350 (*)    All other components within normal limits  CBG MONITORING, ED - Abnormal; Notable for the following:    Glucose-Capillary 307 (*)    All other components within normal limits  I-STAT CHEM 8, ED - Abnormal; Notable for the following:    Glucose, Bld 295 (*)    All other components within normal limits  CBG MONITORING, ED - Abnormal; Notable for the following:    Glucose-Capillary 221 (*)    All other components within normal limits  CBC WITH DIFFERENTIAL  PREGNANCY, URINE  I-STAT CG4 LACTIC ACID, ED  I-STAT TROPOININ, ED    Imaging Review No results found.   EKG Interpretation None      MDM   Final diagnoses:  Wound dehiscence  Hyperglycemia    Patient presents for worsening pain to incision site. She is s/p hidradenitis excision to L axilla. Patient nontoxic/nonseptic appearing on arrival; VSS. Patient neurovascularly intact on exam. Stable orthostatics. She was found to be hyperglycemic on presentation to ED. She states she did not take her diabetes medication this AM.  Surgical site does not appear acutely infected on exam. Have consulted with general surgery who agree site does not appear infected. No leukocytosis or fever. Recommend d/c abx, wet to dry dressings, and outpatient f/u.  Hyperglycemia tx in ED with insulin and IVF bolus x 4. Normal lactate and normal bicarb do not suggest DKA. Patient noncompliant with insulin dose this morning. Hyperglycemia improved to 221 from 478 on arrival. Patient stable and appropriate for d/c with instruction to f/u with PCP PRN. Return precautions provided and patient agreeable to plan with no unaddressed concerns.   Filed Vitals:   01/27/14 1124 01/27/14 1128 01/27/14 1328 01/27/14 1511  BP: 113/63 113/63 126/72 108/68  Pulse: 66 71 81 75  Temp:  98.6 F (37 C)    TempSrc:  Oral    Resp: 18 19 18 18   Height:       Weight:      SpO2: 98% 97% 100% 100%       Antonietta Breach, PA-C 01/28/14 1855

## 2014-01-27 NOTE — ED Notes (Signed)
Pt had sweat gland removed on 01/13/2014, sutures and "a lot of fluids" removed on 01/25/2014. Pt weak up at 2:00 am, the surgical site is open and dry no drainage noticed. Pt is diabetic.  pt states that is been feeling very nauseated since 2:00 am and she vomited almost 12 times since them. Pt c/o pain on her Left arm 8/10 that gets worse with movement and have some abd pain 4/10, pt is c/o having some dizziness, some lost stool since last night, no bladder changes at this time. CBG 453 at this time.

## 2014-01-27 NOTE — Discharge Instructions (Signed)
Take your diabetes medications as prescribed. Take Norco as needed for pain and Zofran for nausea/vomiting. Continue wet to dry dressings. Discontinue your antibiotics. Follow up with general surgery. Return if symptoms worsen.  Wound Care Wound care helps prevent pain and infection.  You may need a tetanus shot if:  You cannot remember when you had your last tetanus shot.  You have never had a tetanus shot.  The injury broke your skin. If you need a tetanus shot and you choose not to have one, you may get tetanus. Sickness from tetanus can be serious. HOME CARE   Only take medicine as told by your doctor.  Clean the wound daily with mild soap and water.  Change any bandages (dressings) as told by your doctor.  Put medicated cream and a bandage on the wound as told by your doctor.  Change the bandage if it gets wet, dirty, or starts to smell.  Take showers. Do not take baths, swim, or do anything that puts your wound under water.  Rest and raise (elevate) the wound until the pain and puffiness (swelling) are better.  Keep all doctor visits as told. GET HELP RIGHT AWAY IF:   Yellowish-white fluid (pus) comes from the wound.  Medicine does not lessen your pain.  There is a red streak going away from the wound.  You have a fever. MAKE SURE YOU:   Understand these instructions.  Will watch your condition.  Will get help right away if you are not doing well or get worse. Document Released: 06/17/2008 Document Revised: 12/01/2011 Document Reviewed: 01/12/2011 [facility] Patient Information 2014 [place], [place]. High Blood Sugar High blood sugar (hyperglycemia) means that the level of sugar in your blood is higher than it should be. Signs of high blood sugar include:  Feeling thirsty.  Frequent peeing (urinating).  Feeling tired or sleepy.  Dry mouth.  Vision changes.  Feeling weak.  Feeling hungry but losing weight.  Numbness and tingling in your hands or  feet.  Headache. When you ignore these signs, your blood sugar may keep going up. These problems may get worse, and other problems may begin. HOME CARE  Check your blood sugars as told by your doctor. Write down the numbers with the date and time.  Take the right amount of insulin or diabetes pills at the right time. Write down the dose with date and time.  Refill your insulin or diabetes pills before running out.  Watch what you eat. Follow your meal plan.  Drink liquids without sugar, such as water. Check with your doctor if you have kidney or heart disease.  Follow your doctor's orders for exercise. Exercise at the same time of day.  Keep your doctor's appointments. GET HELP RIGHT AWAY IF:   You have trouble thinking or are confused.  You have fast breathing with fruity smelling breath.  You pass out (faint).  You have 2 to 3 days of high blood sugars and you do not know why.  You have chest pain.  You are feeling sick to your stomach (nauseous) or throwing up (vomiting).  You have sudden vision changes. MAKE SURE YOU:   Understand these instructions.  Will watch your condition.  Will get help right away if you are not doing well or get worse. Document Released: 07/06/2009 Document Revised: 12/01/2011 Document Reviewed: 07/06/2009 [facility] Patient Information 2014 [place], [place].

## 2014-01-27 NOTE — Progress Notes (Signed)
  Subjective: [name] presents today with increased left axillary drainage. The drainage is clear.   She also reports temp 99.7, chills, episode of diarrhea this am and n/v since last night.  CBGs high in the ED, she did not take her insulin.  She has been on Clindamycin for 1 week now, prior to that she was on Doxycycline, however, was unable to tolerate. She has been taking vicodin sparingly and taking ibuprofen prn for pain.   Objective: Vital signs in last 24 hours: Temp:  [98.1 F (36.7 C)-99.1 F (37.3 C)] 99.1 F (37.3 C) (05/08 0912) Pulse Rate:  [73-95] 95 (05/08 0914) BP: (119-132)/(67-78) 132/74 mmHg (05/08 0914) SpO2:  [97 %] 97 % (05/08 0831) Weight:  [227 lb (102.967 kg)] 227 lb (102.967 kg) (05/08 0831)    Intake/Output from previous day:   Intake/Output this shift:   Physical Exam: General appearance: alert, cooperative and no distress       Lab Results:   Recent Labs  01/27/14 0841  WBC 6.7  HGB 12.7  HCT 37.7  PLT 290   BMET  Recent Labs  01/27/14 0841  NA 137  K 4.6  CL 101  CO2 21  GLUCOSE 478*  BUN 14  CREATININE 0.78  CALCIUM 8.9   PT/INR No results found for this basename: LABPROT, INR,  in the last 72 hours ABG No results found for this basename: PHART, PCO2, PO2, HCO3,  in the last 72 hours  Studies/Results: No results found.  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 Ninfa Linden 01/13/14  The incision has opened up.  It does not appear infected.  She may start BID wet to dry dressing changes.  She will continue to have drainage from the wound due to the [place].  I encouraged her to take vicodin with food PRN for pain.  She may stop the clindamycin.  She will need a stool for c diff should she continue to have diarrhea.  Further management per EDP.  She has a follow up with Dr. Ninfa Linden.  She was  encouraged to call our clinic with questions or concerns.    LOS: 0 days    [name] ANP-BC 01/27/2014 11:33 AM

## 2014-01-27 NOTE — ED Notes (Signed)
CBG is 421

## 2014-01-30 NOTE — ED Provider Notes (Signed)
Medical screening examination/treatment/procedure(s) were performed by non-physician practitioner and as supervising physician I was immediately available for consultation/collaboration.   EKG Interpretation None       Babette Relic, MD 01/30/14 1447

## 2014-02-07 ENCOUNTER — Encounter (INDEPENDENT_AMBULATORY_CARE_PROVIDER_SITE_OTHER): Payer: Self-pay | Admitting: Surgery

## 2014-02-07 ENCOUNTER — Ambulatory Visit (INDEPENDENT_AMBULATORY_CARE_PROVIDER_SITE_OTHER): Payer: Commercial Managed Care - PPO | Admitting: Surgery

## 2014-02-07 VITALS — BP 122/78 | HR 80 | Temp 97.6°F | Resp 12 | Ht 65.0 in | Wt 220.6 lb

## 2014-02-07 DIAGNOSIS — Z09 Encounter for follow-up examination after completed treatment for conditions other than malignant neoplasm: Secondary | ICD-10-CM

## 2014-02-07 NOTE — Progress Notes (Signed)
Subjective:     Patient ID: [name], female   DOB: 06-Jan-1981, 33 y.o.   MRN: [id number]  HPI She is here for a follow up visit.  She is doing well.  She is currently doing wet to dry dressing changes  Review of Systems     Objective:   Physical Exam The left axillary wound looks good.  I placed A-cell powder back into the wound    Assessment:     Stable post op     Plan:      she will continue her current wound care and I will see her back in about 3 weeks

## 2014-02-27 ENCOUNTER — Ambulatory Visit (INDEPENDENT_AMBULATORY_CARE_PROVIDER_SITE_OTHER): Payer: Commercial Managed Care - PPO | Admitting: Surgery

## 2014-02-27 ENCOUNTER — Encounter (INDEPENDENT_AMBULATORY_CARE_PROVIDER_SITE_OTHER): Payer: Self-pay | Admitting: Surgery

## 2014-02-27 VITALS — BP 133/76 | HR 77 | Temp 98.6°F | Resp 16 | Ht 65.0 in | Wt 221.0 lb

## 2014-02-27 DIAGNOSIS — Z09 Encounter for follow-up examination after completed treatment for conditions other than malignant neoplasm: Secondary | ICD-10-CM

## 2014-02-27 NOTE — Progress Notes (Signed)
Subjective:     Patient ID: [name], female   DOB: May 18, 1981, 33 y.o.   MRN: [id number]  HPI She is here for another postop visit. She is doing well has no complaints.  Review of Systems     Objective:   Physical Exam Her left axilla wound has continued to contract very well. There is only small open area which I treated with silver nitrate    Assessment:     Patient doing well postop     Plan:     I will see her back in 3 weeks. She will continue her current wound care

## 2014-03-03 ENCOUNTER — Telehealth (INDEPENDENT_AMBULATORY_CARE_PROVIDER_SITE_OTHER): Payer: Self-pay

## 2014-03-03 ENCOUNTER — Encounter (INDEPENDENT_AMBULATORY_CARE_PROVIDER_SITE_OTHER): Payer: Commercial Managed Care - PPO | Admitting: Surgery

## 2014-03-03 NOTE — Telephone Encounter (Signed)
Pt s/p excision of hidradenitis in her left axilla. Pt states that since she has seen Dr Ninfa Linden on 02/27/14 she has noticed that her left arm has a hard area in the same area as before. Pt states that her arm has started swelling and is very tender. Pt had a episode at work yesterday of dizzness and nausea. Pt is going to see her PCP this am and wanted to see if Dr Ninfa Linden would want to see her as well. Informed pt that I would send Dr Ninfa Linden a message and make him aware of his concerns and we would call her back with his recommendations. Pt verbalized understanding and agrees with POC.

## 2014-03-03 NOTE — Telephone Encounter (Signed)
She will need to be seen in the urgent office this afternoon

## 2014-03-03 NOTE — Telephone Encounter (Signed)
Informed pt that Dr Ninfa Linden would like to pt to come in to see one of his partners this afternoon. Urgent office appt made with Dr Harlow Asa. Pt verbalized understanding

## 2014-03-09 ENCOUNTER — Other Ambulatory Visit: Payer: Self-pay | Admitting: Internal Medicine

## 2014-03-09 ENCOUNTER — Telehealth: Payer: Self-pay | Admitting: Internal Medicine

## 2014-03-09 MED ORDER — AZITHROMYCIN 250 MG PO TABS: ORAL_TABLET | ORAL | Status: DC

## 2014-03-09 NOTE — Telephone Encounter (Signed)
Ear pain L>R, post nasal drip, tender cervical nodes Plan- Z pak sent. Penicillin allergy assessment labs.

## 2014-03-20 ENCOUNTER — Encounter (INDEPENDENT_AMBULATORY_CARE_PROVIDER_SITE_OTHER): Payer: Self-pay | Admitting: Surgery

## 2014-03-20 ENCOUNTER — Ambulatory Visit (INDEPENDENT_AMBULATORY_CARE_PROVIDER_SITE_OTHER): Payer: Commercial Managed Care - PPO | Admitting: Surgery

## 2014-03-20 VITALS — BP 126/80 | HR 98 | Temp 98.4°F | Ht 65.0 in | Wt 221.0 lb

## 2014-03-20 DIAGNOSIS — Z09 Encounter for follow-up examination after completed treatment for conditions other than malignant neoplasm: Secondary | ICD-10-CM

## 2014-03-20 NOTE — Progress Notes (Signed)
Subjective:     Patient ID: [name], female   DOB: 07/19/1981, 33 y.o.   MRN: [id number]  HPI She is here for another postoperative visit. She reports some increased drainage from the left axilla.  Review of Systems     Objective:   Physical Exam On exam, the wound is clean with some fibrinous exudate. It is still 2 cm in size. I treated with silver nitrate.    Assessment:     Patient stable postop     Plan:     As there is some slight erythema and she is diabetic I told her to resume her doxycycline for a week. I will see her back at the end of next week to see how she is progressing.

## 2014-03-31 ENCOUNTER — Ambulatory Visit (INDEPENDENT_AMBULATORY_CARE_PROVIDER_SITE_OTHER): Payer: Commercial Managed Care - PPO | Admitting: Surgery

## 2014-03-31 ENCOUNTER — Encounter (INDEPENDENT_AMBULATORY_CARE_PROVIDER_SITE_OTHER): Payer: Self-pay | Admitting: Surgery

## 2014-03-31 VITALS — BP 130/76 | HR 80 | Wt 224.0 lb

## 2014-03-31 DIAGNOSIS — Z09 Encounter for follow-up examination after completed treatment for conditions other than malignant neoplasm: Secondary | ICD-10-CM

## 2014-03-31 NOTE — Progress Notes (Signed)
Subjective:     Patient ID: [name], female   DOB: 02-17-1981, 33 y.o.   MRN: [id number]  HPI She is here for another postoperative visit. She finished her course of antibiotics and feels well  Review of Systems     Objective:   Physical Exam On exam, her left axillary incision is completely healed and there is no erythema    Assessment:     Patient stable postop     Plan:     I will see her back as needed. She will call me should she develop any erythema in the left axilla and we will resume doxycycline

## 2014-05-01 ENCOUNTER — Ambulatory Visit (INDEPENDENT_AMBULATORY_CARE_PROVIDER_SITE_OTHER): Payer: Commercial Managed Care - PPO | Admitting: General Surgery

## 2014-05-01 ENCOUNTER — Encounter (INDEPENDENT_AMBULATORY_CARE_PROVIDER_SITE_OTHER): Payer: Self-pay | Admitting: General Surgery

## 2014-05-01 ENCOUNTER — Other Ambulatory Visit (INDEPENDENT_AMBULATORY_CARE_PROVIDER_SITE_OTHER): Payer: Self-pay | Admitting: General Surgery

## 2014-05-01 VITALS — BP 120/70 | HR 76 | Temp 98.5°F | Ht 65.0 in | Wt 221.0 lb

## 2014-05-01 DIAGNOSIS — L02419 Cutaneous abscess of limb, unspecified: Secondary | ICD-10-CM

## 2014-05-01 DIAGNOSIS — IMO0002 Reserved for concepts with insufficient information to code with codable children: Secondary | ICD-10-CM

## 2014-05-01 MED ORDER — DOXYCYCLINE HYCLATE 50 MG PO CAPS: 50.0000 mg | ORAL_CAPSULE | Freq: Two times a day (BID) | ORAL | Status: DC

## 2014-05-01 NOTE — Addendum Note (Signed)
Addended by: Ralene Ok on: 05/01/2014 03:41 PM   Modules accepted: Orders

## 2014-05-01 NOTE — Addendum Note (Signed)
Addended by: Ivor Costa on: 05/01/2014 03:44 PM   Modules accepted: Orders

## 2014-05-01 NOTE — Progress Notes (Signed)
Subjective:     Patient ID: [name], female   DOB: 13-Jun-1981, 33 y.o.   MRN: [id number]  HPI The patient is a 33 year old female has a history of hidradenitis. Patient recently underwent an excision of a left axillary hidradenitis per Dr. Ninfa Linden on April 25. Patient states that while on the doxycycline and she began having a right axillary abscess. She states is been no drain.  Review of Systems  Constitutional: Negative.   HENT: Negative.   Respiratory: Negative.   Cardiovascular: Negative.   Gastrointestinal: Negative.   Neurological: Negative.   All other systems reviewed and are negative.      Objective:   Physical Exam  Constitutional: She is oriented to person, place, and time. She appears well-developed and well-nourished.  HENT:  Head: Normocephalic and atraumatic.  Eyes: Conjunctivae and EOM are normal. Pupils are equal, round, and reactive to light.  Neck: Normal range of motion. Neck supple.  Cardiovascular: Normal rate, regular rhythm and normal heart sounds.   Pulmonary/Chest: Effort normal and breath sounds normal.    Abdominal: Soft. Bowel sounds are normal.  Musculoskeletal: Normal range of motion.  Neurological: She is alert and oriented to person, place, and time.  Skin: Skin is warm and dry.  Psychiatric: She has a normal mood and affect.    Procedure note The patient's leg was prepped and draped in the usual sterile fashion.  The area was infiltrated with Marcaine with epi. 11 blade was used to make a decision regarding fluctuance. The wound was cultured. The area was packed with Xeroform gauze. The wound dressed with 4 x 4's and tape. The patient tolerated the procedure well.     Assessment:     33 y/o F with right axillary hidradenitis, an abscess     Plan:     1.  Continue with doxycycline at this time. 2.  The patient will follow up in 2 weeks for wound check.

## 2014-05-04 LAB — WOUND CULTURE: Gram Stain: NONE SEEN

## 2014-05-08 ENCOUNTER — Other Ambulatory Visit: Payer: Self-pay | Admitting: Internal Medicine

## 2014-05-08 DIAGNOSIS — J302 Other seasonal allergic rhinitis: Secondary | ICD-10-CM

## 2014-05-08 DIAGNOSIS — J3089 Other allergic rhinitis: Secondary | ICD-10-CM

## 2014-05-11 ENCOUNTER — Other Ambulatory Visit: Payer: 59

## 2014-05-11 DIAGNOSIS — J3089 Other allergic rhinitis: Secondary | ICD-10-CM

## 2014-05-11 DIAGNOSIS — J302 Other seasonal allergic rhinitis: Secondary | ICD-10-CM

## 2014-05-12 ENCOUNTER — Ambulatory Visit (INDEPENDENT_AMBULATORY_CARE_PROVIDER_SITE_OTHER): Payer: Commercial Managed Care - PPO | Admitting: Surgery

## 2014-05-12 VITALS — BP 124/86 | HR 104 | Temp 98.3°F | Ht 65.0 in | Wt 221.5 lb

## 2014-05-12 DIAGNOSIS — Z09 Encounter for follow-up examination after completed treatment for conditions other than malignant neoplasm: Secondary | ICD-10-CM

## 2014-05-12 LAB — ALLERGEN FOOD PROFILE SPECIFIC IGE
Apple: 0.14 kU/L — ABNORMAL HIGH
Corn: 0.11 kU/L — ABNORMAL HIGH
EGG WHITE IGE: 0.53 kU/L — AB
Fish Cod: <0.1 kU/L
IgE (Immunoglobulin E), Serum: 243.1 IU/mL — ABNORMAL HIGH (ref 0.0–180.0)
Milk IgE: 3.69 kU/L — ABNORMAL HIGH
Peanut IgE: 0.13 kU/L — ABNORMAL HIGH
SHRIMP IGE: 0.28 kU/L — AB
Soybean IgE: <0.1 kU/L
Tomato IgE: <0.1 kU/L
Tuna IgE: <0.1 kU/L
Wheat IgE: 0.19 kU/L — ABNORMAL HIGH

## 2014-05-12 MED ORDER — HYDROCODONE-ACETAMINOPHEN 5-325 MG PO TABS: 1.0000 | ORAL_TABLET | ORAL | Status: DC | PRN

## 2014-05-12 MED ORDER — CLINDAMYCIN PHOSPHATE 1 % EX GEL: Freq: Two times a day (BID) | CUTANEOUS | Status: DC

## 2014-05-12 MED ORDER — CLINDAMYCIN HCL 150 MG PO CAPS: 150.0000 mg | ORAL_CAPSULE | Freq: Four times a day (QID) | ORAL | Status: AC

## 2014-05-12 NOTE — Progress Notes (Signed)
Subjective:     Patient ID: [name], female   DOB: Jan 03, 1981, 33 y.o.   MRN: [id number]  HPI She is here for first postop visit status post incision and drainage of a right axillary abscess or hidradenitis. She is still having discomfort in the axilla. Her other previous areas of hidradenitis are stable  Review of Systems     Objective:   Physical Exam On exam, the incision is healing well but is tender. She has a second area consistent with inflamed hidradenitis in the right axilla without abscess    Assessment:     Stable postop     Plan:     She will resume the clindamycin gel as well as oral clindamycin. She made need wide excision of this area in the operating room eventually. I will see her back if she does not improve.

## 2014-05-16 ENCOUNTER — Encounter: Payer: Self-pay | Admitting: Internal Medicine

## 2014-05-16 LAB — ALPHA-GAL PANEL
Beef (Bos spp) IgE: <0.1 kU/L (ref <0.35)
Class Interpretation: 0
LAMB/MUTTON (OVIS SPP) IGE: 0.18 kU/L (ref <0.35)
PORK (SUS SPP) IGE: 0.23 kU/L (ref <0.35)

## 2014-05-25 ENCOUNTER — Emergency Department (HOSPITAL_COMMUNITY)
Admission: EM | Admit: 2014-05-25 | Discharge: 2014-05-25 | Disposition: A | Discharge status: Home / Self Care | Payer: 59 | Attending: Emergency Medicine | Admitting: Emergency Medicine

## 2014-05-25 ENCOUNTER — Encounter (HOSPITAL_COMMUNITY): Payer: Self-pay | Admitting: Emergency Medicine

## 2014-05-25 DIAGNOSIS — Z8614 Personal history of Methicillin resistant Staphylococcus aureus infection: Secondary | ICD-10-CM | POA: Diagnosis not present

## 2014-05-25 DIAGNOSIS — Z91040 Latex allergy status: Secondary | ICD-10-CM | POA: Diagnosis not present

## 2014-05-25 DIAGNOSIS — Z872 Personal history of diseases of the skin and subcutaneous tissue: Secondary | ICD-10-CM | POA: Diagnosis not present

## 2014-05-25 DIAGNOSIS — R109 Unspecified abdominal pain: Secondary | ICD-10-CM | POA: Insufficient documentation

## 2014-05-25 DIAGNOSIS — Z880 Allergy status to penicillin: Secondary | ICD-10-CM | POA: Insufficient documentation

## 2014-05-25 DIAGNOSIS — Z8701 Personal history of pneumonia (recurrent): Secondary | ICD-10-CM | POA: Insufficient documentation

## 2014-05-25 DIAGNOSIS — Z8679 Personal history of other diseases of the circulatory system: Secondary | ICD-10-CM | POA: Insufficient documentation

## 2014-05-25 DIAGNOSIS — F17200 Nicotine dependence, unspecified, uncomplicated: Secondary | ICD-10-CM | POA: Insufficient documentation

## 2014-05-25 DIAGNOSIS — F329 Major depressive disorder, single episode, unspecified: Secondary | ICD-10-CM | POA: Diagnosis not present

## 2014-05-25 DIAGNOSIS — Z8719 Personal history of other diseases of the digestive system: Secondary | ICD-10-CM | POA: Diagnosis not present

## 2014-05-25 DIAGNOSIS — R197 Diarrhea, unspecified: Secondary | ICD-10-CM | POA: Diagnosis not present

## 2014-05-25 DIAGNOSIS — R112 Nausea with vomiting, unspecified: Secondary | ICD-10-CM | POA: Diagnosis not present

## 2014-05-25 DIAGNOSIS — L03119 Cellulitis of unspecified part of limb: Principal | ICD-10-CM

## 2014-05-25 DIAGNOSIS — F3289 Other specified depressive episodes: Secondary | ICD-10-CM | POA: Insufficient documentation

## 2014-05-25 DIAGNOSIS — Z853 Personal history of malignant neoplasm of breast: Secondary | ICD-10-CM | POA: Diagnosis not present

## 2014-05-25 DIAGNOSIS — J45909 Unspecified asthma, uncomplicated: Secondary | ICD-10-CM | POA: Diagnosis not present

## 2014-05-25 DIAGNOSIS — Z794 Long term (current) use of insulin: Secondary | ICD-10-CM | POA: Diagnosis not present

## 2014-05-25 DIAGNOSIS — L02419 Cutaneous abscess of limb, unspecified: Secondary | ICD-10-CM | POA: Diagnosis present

## 2014-05-25 DIAGNOSIS — E119 Type 2 diabetes mellitus without complications: Secondary | ICD-10-CM | POA: Insufficient documentation

## 2014-05-25 DIAGNOSIS — Z792 Long term (current) use of antibiotics: Secondary | ICD-10-CM | POA: Diagnosis not present

## 2014-05-25 DIAGNOSIS — R011 Cardiac murmur, unspecified: Secondary | ICD-10-CM | POA: Diagnosis not present

## 2014-05-25 DIAGNOSIS — L02415 Cutaneous abscess of right lower limb: Secondary | ICD-10-CM

## 2014-05-25 HISTORY — DX: Hidradenitis suppurativa: L73.2

## 2014-05-25 LAB — CBC WITH DIFFERENTIAL/PLATELET
BASOS PCT: 1 % (ref 0–1)
Basophils Absolute: 0 10*3/uL (ref 0.0–0.1)
EOS PCT: 3 % (ref 0–5)
Eosinophils Absolute: 0.2 10*3/uL (ref 0.0–0.7)
HEMATOCRIT: 42.7 % (ref 36.0–46.0)
Hemoglobin: 14.7 g/dL (ref 12.0–15.0)
LYMPHS PCT: 33 % (ref 12–46)
Lymphs Abs: 2.7 10*3/uL (ref 0.7–4.0)
MCH: 31.7 pg (ref 26.0–34.0)
MCHC: 34.4 g/dL (ref 30.0–36.0)
MCV: 92 fL (ref 78.0–100.0)
MONO ABS: 0.4 10*3/uL (ref 0.1–1.0)
Monocytes Relative: 5 % (ref 3–12)
Neutro Abs: 4.8 10*3/uL (ref 1.7–7.7)
Neutrophils Relative %: 58 % (ref 43–77)
Platelets: 307 10*3/uL (ref 150–400)
RBC: 4.64 MIL/uL (ref 3.87–5.11)
RDW: 12.6 % (ref 11.5–15.5)
WBC: 8.2 10*3/uL (ref 4.0–10.5)

## 2014-05-25 LAB — CBG MONITORING, ED
GLUCOSE-CAPILLARY: 59 mg/dL — AB (ref 70–99)
Glucose-Capillary: 209 mg/dL — ABNORMAL HIGH (ref 70–99)
Glucose-Capillary: 52 mg/dL — ABNORMAL LOW (ref 70–99)

## 2014-05-25 LAB — BLOOD GAS, VENOUS
Acid-base deficit: 0.6 mmol/L (ref 0.0–2.0)
Bicarbonate: 23.8 mEq/L (ref 20.0–24.0)
O2 Saturation: 70.4 %
PATIENT TEMPERATURE: 98.6
PCO2 VEN: 40.5 mmHg — AB (ref 45.0–50.0)
TCO2: 20.2 mmol/L (ref 0–100)
pH, Ven: 7.387 — ABNORMAL HIGH (ref 7.250–7.300)
pO2, Ven: 35.4 mmHg (ref 30.0–45.0)

## 2014-05-25 LAB — COMPREHENSIVE METABOLIC PANEL
ALT: 9 U/L (ref 0–35)
ANION GAP: 14 (ref 5–15)
AST: 13 U/L (ref 0–37)
Albumin: 3.6 g/dL (ref 3.5–5.2)
Alkaline Phosphatase: 103 U/L (ref 39–117)
BILIRUBIN TOTAL: 0.4 mg/dL (ref 0.3–1.2)
BUN: 11 mg/dL (ref 6–23)
CALCIUM: 9.4 mg/dL (ref 8.4–10.5)
CHLORIDE: 103 meq/L (ref 96–112)
CO2: 22 meq/L (ref 19–32)
Creatinine, Ser: 0.58 mg/dL (ref 0.50–1.10)
GFR calc Af Amer: >90 mL/min (ref >90)
Glucose, Bld: 145 mg/dL — ABNORMAL HIGH (ref 70–99)
Potassium: 3.9 mEq/L (ref 3.7–5.3)
Sodium: 139 mEq/L (ref 137–147)
Total Protein: 7.2 g/dL (ref 6.0–8.3)

## 2014-05-25 MED ORDER — ONDANSETRON HCL 4 MG/2ML IJ SOLN
4.0000 mg | Freq: Once | INTRAMUSCULAR | Status: AC
  Administered 2014-05-25: 4 mg via INTRAVENOUS
  Filled 2014-05-25: qty 2

## 2014-05-25 MED ORDER — FENTANYL CITRATE 0.05 MG/ML IJ SOLN: 50.0000 ug | Freq: Once | INTRAMUSCULAR | Status: DC

## 2014-05-25 MED ORDER — HYDROMORPHONE HCL PF 1 MG/ML IJ SOLN
0.5000 mg | Freq: Once | INTRAMUSCULAR | Status: AC
  Administered 2014-05-25: 0.5 mg via INTRAVENOUS
  Filled 2014-05-25: qty 1

## 2014-05-25 MED ORDER — SODIUM CHLORIDE 0.9 % IV BOLUS (SEPSIS)
1000.0000 mL | Freq: Once | INTRAVENOUS | Status: AC
  Administered 2014-05-25: 1000 mL via INTRAVENOUS

## 2014-05-25 MED ORDER — ONDANSETRON 8 MG PO TBDP: 8.0000 mg | ORAL_TABLET | Freq: Three times a day (TID) | ORAL | Status: DC | PRN

## 2014-05-25 MED ORDER — LIDOCAINE-EPINEPHRINE 2 %-1:100000 IJ SOLN
20.0000 mL | Freq: Once | INTRAMUSCULAR | Status: DC
  Filled 2014-05-25: qty 1

## 2014-05-25 NOTE — Progress Notes (Signed)
   Right groin.

## 2014-05-25 NOTE — ED Notes (Addendum)
Pt reports hx of hydranenitis/ abscess and DM. Reports she has abscess, 4 on right groin, under right axilla, small one on left thigh. Pt on clindamyacin from Dr Ninfa Linden when she saw him on 8/21. Reports vomiting started this morning. All over body aches 9/10.

## 2014-05-25 NOTE — Discharge Instructions (Signed)
Continue antibiotics. Take pain medications as needed. zofran for nausea. Follow up with Dr. Ninfa Linden tomorrow.   Cellulitis Cellulitis is an infection of the skin and the tissue beneath it. The infected area is usually red and tender. Cellulitis occurs most often in the arms and lower legs.  CAUSES  Cellulitis is caused by bacteria that enter the skin through cracks or cuts in the skin. The most common types of bacteria that cause cellulitis are staphylococci and streptococci. SIGNS AND SYMPTOMS   Redness and warmth.  Swelling.  Tenderness or pain.  Fever. DIAGNOSIS  Your health care provider can usually determine what is wrong based on a physical exam. Blood tests may also be done. TREATMENT  Treatment usually involves taking an antibiotic medicine. HOME CARE INSTRUCTIONS   Take your antibiotic medicine as directed by your health care provider. Finish the antibiotic even if you start to feel better.  Keep the infected arm or leg elevated to reduce swelling.  Apply a warm cloth to the affected area up to 4 times per day to relieve pain.  Take medicines only as directed by your health care provider.  Keep all follow-up visits as directed by your health care provider. SEEK MEDICAL CARE IF:   You notice red streaks coming from the infected area.  Your red area gets larger or turns dark in color.  Your bone or joint underneath the infected area becomes painful after the skin has healed.  Your infection returns in the same area or another area.  You notice a swollen bump in the infected area.  You develop new symptoms.  You have a fever. SEEK IMMEDIATE MEDICAL CARE IF:   You feel very sleepy.  You develop vomiting or diarrhea.  You have a general ill feeling (malaise) with muscle aches and pains. MAKE SURE YOU:   Understand these instructions.  Will watch your condition.  Will get help right away if you are not doing well or get worse. Document Released:  06/18/2005 Document Revised: 01/23/2014 Document Reviewed: 11/24/2011 [facility] Patient Information 2015 [place], [place]. This information is not intended to replace advice given to you by your health care provider. Make sure you discuss any questions you have with your health care provider.

## 2014-05-25 NOTE — ED Notes (Signed)
PA Tatyana notified of CBG 52; pt given sugar packet per PA's order.

## 2014-05-25 NOTE — ED Provider Notes (Signed)
CSN: [id number]     Arrival date & time 05/25/14  3818 History   First MD Initiated Contact with Patient 05/25/14 1058     Chief Complaint  Patient presents with  . Emesis  . Abscess     (Consider location/radiation/quality/duration/timing/severity/associated sxs/prior Treatment) HPI [name] is a 33 y.o. female who presents emergency department complaining of nausea, vomiting, generalized weakness and malaise, abscess to the right eye. Patient is a type I diabetic, with history of hydradenitis upper teeth. She is followed by Dr. Ninfa Linden a general surgery. States she's currently on clindamycin which she has been on for about 2 weeks. Patient states that this morning she started having nausea and vomiting. She states she feels generally weak. States she called surgery office and was told the Dr. Rush Farmer is not in today. She states that her thigh is getting worse as well. She states when the abscess gets this bad, she usually has to be taken to surgery to have it drained. She denies any fever but admits to chills. Reports diffuse abdominal pain. States also had some loose stools this morning. Abscess is not draining. No other complaints.  Past Medical History  Diagnosis Date  . Hx MRSA infection   . Migraine   . Vitamin D insufficiency   . Asthma     as a child  . GERD (gastroesophageal reflux disease)     no current meds.  . Hidradenitis 04/2012    bilat. thighs, left groin - open areas on thighs  . Diabetes mellitus     IDDM, Insulin pump; followed by Dr. [name]  . Abscess of left axilla - PREVOTELLA BIVIA & Staph Coag Neg 07/12/2012    MODERATE PREVOTELLA BIVIA Note: BETA LACTAMASE NEGATIVE    . PCOS (polycystic ovarian syndrome)   . Heart murmur   . Pneumonia     hx  . Depression     hx  . Cancer     skin tag rt breast  . Hydradenitis    Past Surgical History  Procedure Laterality Date  . Wisdom tooth extraction    . Tonsillectomy    . Bunionectomy      left   . Esophagogastroduodenoscopy  11/17/2011    Procedure: ESOPHAGOGASTRODUODENOSCOPY (EGD);  Surgeon: [name], MD;  Location: [facility];  Service: Endoscopy;  Laterality: N/A;  . Eye surgery      exc. stye left eye  . Breast surgery      right lumpectomy  . Cholecystectomy  12/02/2006    lap. chole.  . Dilation and evacuation  02/14/2007  . Hydradenitis excision  04/30/2012    Procedure: EXCISION HYDRADENITIS GROIN;  Surgeon: [name], MD;  Location: [facility];  Service: General;  Laterality: Left;  wide excision hidradenitis bilateral thighs and Left groin  . Hydradenitis excision Left 01/13/2014    Procedure: WIDE EXCISION HIDRADENITIS LEFT AXILLA;  Surgeon: [name], MD;  Location: [facility];  Service: General;  Laterality: Left;   History reviewed. No pertinent family history. History  Substance Use Topics  . Smoking status: Current Some Day Smoker -- 0.75 packs/day for 14 years    Types: Cigarettes  . Smokeless tobacco: Never Used     Comment: 5 cig./day  . Alcohol Use: No   OB History   Grav Para Term Preterm Abortions TAB SAB Ect Mult Living   4    3  3   1      Review of Systems  Constitutional: Negative for fever and chills.  Respiratory: Negative for cough, chest tightness and shortness of breath.   Cardiovascular: Negative for chest pain, palpitations and leg swelling.  Gastrointestinal: Positive for nausea, vomiting, abdominal pain and diarrhea.  Genitourinary: Negative for dysuria, urgency, frequency and flank pain.  Musculoskeletal: Negative for arthralgias, myalgias, neck pain and neck stiffness.  Skin: Negative for rash.  Neurological: Negative for dizziness, weakness and headaches.  All other systems reviewed and are negative.     Allergies  Latex; Levofloxacin; Moxifloxacin; Oxycodone-acetaminophen; Peach; Penicillins; Propoxyphene n-acetaminophen; Rosiglitazone maleate; Xolair; Adhesive; Potassium-containing  compounds; Prednisone; Morphine and related; Cefaclor; Keflex; Promethazine hcl; and Sulfadiazine  Home Medications   Prior to Admission medications   Medication Sig Start Date End Date Taking? Authorizing Provider  clindamycin (CLEOCIN) 150 MG capsule Take 150 mg by mouth 3 (three) times daily.   Yes Historical Provider, MD  clindamycin (CLINDAGEL) 1 % gel Apply 1 application topically 2 (two) times daily as needed (boil / rash).    Yes Historical Provider, MD  doxycycline (VIBRAMYCIN) 50 MG capsule Take 50 mg by mouth 2 (two) times daily.   Yes Historical Provider, MD  HYDROcodone-acetaminophen (NORCO/VICODIN) 5-325 MG per tablet Take 1-2 tablets by mouth every 4 (four) hours as needed for moderate pain.   Yes Historical Provider, MD  insulin aspart (NOVOLOG) 100 UNIT/ML injection Inject 1-10 Units into the skin 3 (three) times daily before meals.    Yes Historical Provider, MD  insulin glargine (LANTUS) 100 UNIT/ML injection Inject 55 Units into the skin every morning.  05/10/13  Yes Charlynne Cousins, MD   BP 131/78  Pulse 97  Temp(Src) 98.1 F (36.7 C) (Oral)  Resp 18  SpO2 100% Physical Exam  Nursing note and vitals reviewed. Constitutional: She is oriented to person, place, and time. She appears well-developed and well-nourished. No distress.  HENT:  Head: Normocephalic.  Eyes: Conjunctivae are normal.  Neck: Neck supple.  Cardiovascular: Normal rate, regular rhythm and normal heart sounds.   Pulmonary/Chest: Effort normal and breath sounds normal. No respiratory distress. She has no wheezes. She has no rales.  Abdominal: Soft. Bowel sounds are normal. She exhibits no distension. There is tenderness. There is no rebound.  Diffuse tenderness  Musculoskeletal: She exhibits no edema.  Neurological: She is alert and oriented to person, place, and time.  Skin: Skin is warm and dry.  Abscess to the proximal medial thigh, with surrounding cellulitis. Tender to palpation. No  drainage. Cellulitic area measuring about 10 x 10 cm.  Psychiatric: She has a normal mood and affect. Her behavior is normal.    ED Course  Procedures (including critical care time) Labs Review Labs Reviewed  COMPREHENSIVE METABOLIC PANEL - Abnormal; Notable for the following:    Glucose, Bld 145 (*)    All other components within normal limits  BLOOD GAS, VENOUS - Abnormal; Notable for the following:    pH, Ven 7.387 (*)    pCO2, Ven 40.5 (*)    All other components within normal limits  CBG MONITORING, ED - Abnormal; Notable for the following:    Glucose-Capillary 209 (*)    All other components within normal limits  CBG MONITORING, ED - Abnormal; Notable for the following:    Glucose-Capillary 59 (*)    All other components within normal limits  CBG MONITORING, ED - Abnormal; Notable for the following:    Glucose-Capillary 52 (*)    All other components within normal limits  CBC WITH DIFFERENTIAL  Imaging Review No results found.   EKG Interpretation None      MDM   Final diagnoses:  Abscess of right thigh    Pt with right thigh abscess. Type 1 DM. Nauseated, generalized malaise. Will get labs, IV fluids, zofran ordered.    3:03 PM Pt has no signs of DKA. No elevated WBC. Apparently now saying just started clindamycin 3 days ago. Spoke with general surgery.  Pt seen by them, pt has an apt with Dr. Ninfa Linden tomorrow morning. She is feeling better. There is no indication for admission at this time. She is non toxic appearing. Nad. Home with zofran. Follow up with Dr. Ninfa Linden tomorrow.    Filed Vitals:   05/25/14 0952 05/25/14 1236 05/25/14 1447  BP: 131/78 99/67 111/68  Pulse: 97 80 95  Temp: 98.1 F (36.7 C)    TempSrc: Oral    Resp: 18 16 16   SpO2: 100% 99% 100%      Renold Genta, PA-C 05/25/14 1557

## 2014-05-25 NOTE — Progress Notes (Signed)
General Surgery [facility]  Discussed with Will Creig Hines.  Will arrange definitive care with Dr. Ninfa Linden.  Earnstine Regal, MD, [facility] Office: [phone]

## 2014-05-25 NOTE — Consult Note (Signed)
Reason for Consult:Hidradenitis right groin Referring Physician: Jeannett Senior PA-C PCP:  Barbara Schwalbe, MD Endocrine:  Dr. Cletis Fitzgerald is an 33 y.o. female.  HPI: Unfortunate type I diabetic presents with 24 hours of nausea and now some vomiting.  New tenderness and in the right groin. She says it started on Monday, and has gotten worse over the last 48 hours.  She developed some nausea yesterday and has had some vomiting this AM.  She has not eaten today.  She is tearful in the ED and says when it gets to be like this she can't have I&D done without general anesthesia.  She was last seen by Dr. Ninfa Fitzgerald on 05/12/14 and given Clindamycin PO for her right axilla I&D done in our office 05/01/14.  She had been on doxycycline prior to the 05/12/14 office visit.  She was not able to fill the prescription until 8/29, and so she still has 3 days of oral antibiotics left at home.  Work up in the ED is unremarkable. She is afebrile, labs show PH 7.38, glucose 145, she reports she has not eaten.  WBC is 8.2 with a normal differential.  She has an appointment with Dr. Ninfa Fitzgerald in the AM at 8:45.  Past Medical History  Diagnosis Date  Type I diabetes with hx of DKA  Recurrent hydradenitis, both axilla and groins  Hydradenitis excision  04/30/2012   Procedure: EXCISION HYDRADENITIS GROIN;  Surgeon: Barbara Bowie, MD;  Location: [facility];  Service: General;  Laterality: Left;  wide excision hidradenitis bilateral thighs and Left groin  Hydradenitis excision Left 01/13/2014   Procedure: WIDE EXCISION HIDRADENITIS LEFT AXILLA;  Surgeon: Barbara Bowie, MD;  Location: [facility];  Service: General;  Laterality: Left;    Multiple allergies 16 listed below  Hx of Asthma  Followed by Dr. Keturah Fitzgerald  Hx of migraines     Hx MRSA infection   PCOS (polycystic ovarian syndrome)   Vitamin D insufficiency   GERD (gastroesophageal reflux disease)   no current meds.  Cancer  skin  tag rt breast          Past Surgical History  Procedure Laterality Date  Abscess of left axilla - PREVOTELLA BIVIA & Staph Coag Neg 07/12/2012   MODERATE PREVOTELLA BIVIA Note: BETA LACTAMASE NEGATIVE        Tonsillectomy    Bunionectomy     left  Esophagogastroduodenoscopy  11/17/2011   Procedure: ESOPHAGOGASTRODUODENOSCOPY (EGD);  Surgeon: Barbara Ng, MD;  Location: [facility];  Service: Endoscopy;  Laterality: N/A;  Eye surgery     exc. stye left eye  Breast surgery     right lumpectomy  Cholecystectomy  12/02/2006   lap. chole.  Dilation and evacuation  02/14/2007        History reviewed. No pertinent family history.  Social History:  reports that she has been smoking Cigarettes.  She has a 10.5 pack-year smoking history. She has never used smokeless tobacco. She reports that she does not drink alcohol or use illicit drugs.  Allergies:  Allergies  Allergen Reactions  . Latex Hives, Shortness Of Breath and Rash  . Levofloxacin Shortness Of Breath and Rash  . Moxifloxacin Shortness Of Breath and Rash  . Oxycodone-Acetaminophen Shortness Of Breath, Swelling and Rash    NORCO/VICODIN OK  . Peach [Prunus Persica] Hives and Shortness Of Breath  . Penicillins Swelling and Rash  . Propoxyphene N-Acetaminophen Swelling    SWELLING OF FACE  AND THROAT  . Rosiglitazone Maleate Swelling    SWELLING OF FACE AND LEGS  . Xolair [Omalizumab]     Rash and anaphyllaxis  . Adhesive [Tape] Hives, Itching and Rash  . Potassium-Containing Compounds Other (See Comments)    IV ROUTE - CAUSES VEINS TO COLLAPSE  . Prednisone Other (See Comments)    SEVERE ELEVATION OF BLOOD SUGAR. Able to tolerate 40 mg  . Morphine And Related Other (See Comments)    Causes hallucinations  . Cefaclor Rash  . Keflex [Cephalexin] Diarrhea and Rash  . Promethazine Hcl Other (See Comments)    IV ROUTE ONLY - JITTERY FEELING. Patient reports that it is mild and she has used promethazine since then   . Sulfadiazine Rash    Medications:  Prior to Admission:  Prior to Admission medications   Medication Sig Start Date End Date Taking? Authorizing Provider  clindamycin (CLEOCIN) 150 MG capsule Take 150 mg by mouth 3 (three) times daily.   Yes Historical Provider, MD  clindamycin (CLINDAGEL) 1 % gel Apply 1 application topically 2 (two) times daily as needed (boil / rash).    Yes Historical Provider, MD  doxycycline (VIBRAMYCIN) 50 MG capsule Take 50 mg by mouth 2 (two) times daily.   Yes Historical Provider, MD  HYDROcodone-acetaminophen (NORCO/VICODIN) 5-325 MG per tablet Take 1-2 tablets by mouth every 4 (four) hours as needed for moderate pain.   Yes Historical Provider, MD  insulin aspart (NOVOLOG) 100 UNIT/ML injection Inject 1-10 Units into the skin 3 (three) times daily before meals.    Yes Historical Provider, MD  insulin glargine (LANTUS) 100 UNIT/ML injection Inject 55 Units into the skin every morning.  05/10/13  Yes Barbara Cousins, MD    Scheduled: . fentaNYL  50 mcg Intravenous Once  . lidocaine-EPINEPHrine  20 mL Intradermal Once   Continuous:  PRN: Anti-infectives   None      Results for orders placed during the hospital encounter of 05/25/14 (from the past 48 hour(s))  CBG MONITORING, ED     Status: Abnormal   Collection Time    05/25/14  9:59 AM      Result Value Ref Range   Glucose-Capillary 209 (*) 70 - 99 mg/dL  CBC WITH DIFFERENTIAL     Status: None   Collection Time    05/25/14 11:57 AM      Result Value Ref Range   WBC 8.2  4.0 - 10.5 K/uL   RBC 4.64  3.87 - 5.11 MIL/uL   Hemoglobin 14.7  12.0 - 15.0 g/dL   HCT 42.7  36.0 - 46.0 %   MCV 92.0  78.0 - 100.0 fL   MCH 31.7  26.0 - 34.0 pg   MCHC 34.4  30.0 - 36.0 g/dL   RDW 12.6  11.5 - 15.5 %   Platelets 307  150 - 400 K/uL   Neutrophils Relative % 58  43 - 77 %   Neutro Abs 4.8  1.7 - 7.7 K/uL   Lymphocytes Relative 33  12 - 46 %   Lymphs Abs 2.7  0.7 - 4.0 K/uL   Monocytes Relative 5  3 -  12 %   Monocytes Absolute 0.4  0.1 - 1.0 K/uL   Eosinophils Relative 3  0 - 5 %   Eosinophils Absolute 0.2  0.0 - 0.7 K/uL   Basophils Relative 1  0 - 1 %   Basophils Absolute 0.0  0.0 - 0.1 K/uL  COMPREHENSIVE METABOLIC PANEL  Status: Abnormal   Collection Time    05/25/14 11:57 AM      Result Value Ref Range   Sodium 139  137 - 147 mEq/L   Potassium 3.9  3.7 - 5.3 mEq/L   Chloride 103  96 - 112 mEq/L   CO2 22  19 - 32 mEq/L   Glucose, Bld 145 (*) 70 - 99 mg/dL   BUN 11  6 - 23 mg/dL   Creatinine, Ser 0.58  0.50 - 1.10 mg/dL   Calcium 9.4  8.4 - 10.5 mg/dL   Total Protein 7.2  6.0 - 8.3 g/dL   Albumin 3.6  3.5 - 5.2 g/dL   AST 13  0 - 37 U/L   ALT 9  0 - 35 U/L   Alkaline Phosphatase 103  39 - 117 U/L   Total Bilirubin 0.4  0.3 - 1.2 mg/dL   GFR calc non Af Amer >90  >90 mL/min   GFR calc Af Amer >90  >90 mL/min   Comment: (NOTE)     The eGFR has been calculated using the CKD EPI equation.     This calculation has not been validated in all clinical situations.     eGFR's persistently <90 mL/min signify possible Chronic Kidney     Disease.   Anion gap 14  5 - 15  BLOOD GAS, VENOUS     Status: Abnormal   Collection Time    05/25/14 11:57 AM      Result Value Ref Range   pH, Ven 7.387 (*) 7.250 - 7.300   pCO2, Ven 40.5 (*) 45.0 - 50.0 mmHg   pO2, Ven 35.4  30.0 - 45.0 mmHg   Bicarbonate 23.8  20.0 - 24.0 mEq/L   TCO2 20.2  0 - 100 mmol/L   Acid-base deficit 0.6  0.0 - 2.0 mmol/L   O2 Saturation 70.4     Patient temperature 98.6     Collection site COLLECTED BY LABORATORY     Drawn by COLLECTED BY LABORATORY     Sample type VEIN      No results found.  Review of Systems  Constitutional: Negative for fever, chills, weight loss, malaise/fatigue and diaphoresis.  HENT: Negative.   Eyes: Negative.   Respiratory: Negative.   Cardiovascular: Negative.   Gastrointestinal: Positive for nausea and vomiting. Negative for heartburn, diarrhea, constipation, blood in stool  and melena.  Genitourinary: Negative.   Musculoskeletal:       Complains of new right thigh pain and numbness walking over the last 2 days.  Skin:       Chronic Hidradenitis,  Most recent right axilla I&D in office 05/01/14    Neurological: Negative.  Negative for weakness.  Endo/Heme/Allergies: Negative.   Psychiatric/Behavioral: Positive for depression.   Blood pressure 99/67, pulse 80, temperature 98.1 F (36.7 C), temperature source Oral, resp. rate 16, SpO2 99.00%. Physical Exam  Constitutional: She is oriented to person, place, and time. She appears well-developed and well-nourished. No distress.  HENT:  Head: Normocephalic and atraumatic.  Nose: Nose normal.  Eyes: Conjunctivae and EOM are normal. Pupils are equal, round, and reactive to light. Right eye exhibits no discharge. Left eye exhibits no discharge.  Neck: Normal range of motion. Neck supple. No JVD present. No tracheal deviation present. No thyromegaly present.  Cardiovascular: Normal rate, regular rhythm, normal heart sounds and intact distal pulses.   No murmur heard. Respiratory: Effort normal and breath sounds normal. No respiratory distress. She has no wheezes. She  has no rales. She exhibits no tenderness.  GI: Soft. Bowel sounds are normal. She exhibits no distension and no mass. There is no tenderness. There is no rebound and no guarding.  Genitourinary:     Musculoskeletal: She exhibits no edema and no tenderness.  Lymphadenopathy:    She has no cervical adenopathy.  Neurological: She is alert and oriented to person, place, and time. No cranial nerve deficit.  Skin: Skin is warm and dry. No rash noted. She is not diaphoretic. No erythema. No pallor.  Psychiatric: She has a normal mood and affect. Her behavior is normal. Judgment and thought content normal.      Assessment/Plan: 1.  Recurrent hydradenitis now in right groin 2.  Type I diabetes Insulin dependant 3.  Recurrent hydradenitis with multiple  excisions, I&D treatments over the years. 4.  Multiple allergies 5.  Hx of Asthma 6.  Hx of migraines 7.  Hx of MRSA 8.  Polycystic ovarian syndrome  Plan;  Dr. Harlow Asa has review and she can come back to our office for her regular appointment with Dr. Ninfa Fitzgerald in the AM at 8:45, already scheduled.  She is to continue her antibiotics, currently she has 3 more days of clindamycin at home.  She will be given something for pain and nausea here in the ED prior to discharge.  [name] 05/25/2014, 2:05 PM

## 2014-05-25 NOTE — Consult Note (Signed)
General Surgery [facility]  Reviewed with Will Creig Hines.  Does not require in-patient care.  Will see her surgeon in AM 9/4.  Earnstine Regal, MD, [facility] Office: [phone]

## 2014-05-26 ENCOUNTER — Other Ambulatory Visit (INDEPENDENT_AMBULATORY_CARE_PROVIDER_SITE_OTHER): Payer: Self-pay | Admitting: Surgery

## 2014-05-26 LAB — CBG MONITORING, ED: Glucose-Capillary: 91 mg/dL (ref 70–99)

## 2014-05-27 NOTE — ED Provider Notes (Signed)
Medical screening examination/treatment/procedure(s) were performed by non-physician practitioner and as supervising physician I was immediately available for consultation/collaboration.   EKG Interpretation None       Varney Biles, MD 05/27/14 0930

## 2014-05-30 ENCOUNTER — Other Ambulatory Visit: Payer: Self-pay | Admitting: Pulmonary Disease

## 2014-05-30 MED ORDER — FEXOFENADINE HCL 180 MG PO TABS: 180.0000 mg | ORAL_TABLET | Freq: Every day | ORAL | Status: DC

## 2014-05-30 NOTE — Telephone Encounter (Signed)
Test for [place]  Printer no rx given

## 2014-05-31 ENCOUNTER — Telehealth (INDEPENDENT_AMBULATORY_CARE_PROVIDER_SITE_OTHER): Payer: Self-pay | Admitting: General Surgery

## 2014-05-31 NOTE — Telephone Encounter (Signed)
Patient called complaining of worsening pain from her right groin. She being treated for hidradenitis with antibiotics. She states that she is not having fevers but is having some nausea and vomiting. I told her that if she felt that her symptoms were worse on antibiotics, she should be seen either in urgent office tomorrow or in the ER tonight. Patient expressed understanding. She will use her best judgment.

## 2014-06-01 ENCOUNTER — Inpatient Hospital Stay (HOSPITAL_COMMUNITY)
Admission: EM | Admit: 2014-06-01 | Discharge: 2014-06-04 | DRG: 603 | Disposition: A | Discharge status: Home / Self Care | Payer: 59 | Attending: Internal Medicine | Admitting: Internal Medicine

## 2014-06-01 ENCOUNTER — Encounter (HOSPITAL_COMMUNITY): Payer: Self-pay | Admitting: Emergency Medicine

## 2014-06-01 DIAGNOSIS — Z09 Encounter for follow-up examination after completed treatment for conditions other than malignant neoplasm: Secondary | ICD-10-CM

## 2014-06-01 DIAGNOSIS — I471 Supraventricular tachycardia: Secondary | ICD-10-CM

## 2014-06-01 DIAGNOSIS — L732 Hidradenitis suppurativa: Secondary | ICD-10-CM | POA: Diagnosis present

## 2014-06-01 DIAGNOSIS — J302 Other seasonal allergic rhinitis: Secondary | ICD-10-CM

## 2014-06-01 DIAGNOSIS — K219 Gastro-esophageal reflux disease without esophagitis: Secondary | ICD-10-CM | POA: Diagnosis present

## 2014-06-01 DIAGNOSIS — E871 Hypo-osmolality and hyponatremia: Secondary | ICD-10-CM

## 2014-06-01 DIAGNOSIS — F17200 Nicotine dependence, unspecified, uncomplicated: Secondary | ICD-10-CM | POA: Diagnosis present

## 2014-06-01 DIAGNOSIS — L02419 Cutaneous abscess of limb, unspecified: Principal | ICD-10-CM | POA: Diagnosis present

## 2014-06-01 DIAGNOSIS — F3289 Other specified depressive episodes: Secondary | ICD-10-CM | POA: Diagnosis present

## 2014-06-01 DIAGNOSIS — L0390 Cellulitis, unspecified: Secondary | ICD-10-CM

## 2014-06-01 DIAGNOSIS — L03115 Cellulitis of right lower limb: Secondary | ICD-10-CM | POA: Diagnosis present

## 2014-06-01 DIAGNOSIS — J3089 Other allergic rhinitis: Secondary | ICD-10-CM

## 2014-06-01 DIAGNOSIS — Z794 Long term (current) use of insulin: Secondary | ICD-10-CM

## 2014-06-01 DIAGNOSIS — Z8614 Personal history of Methicillin resistant Staphylococcus aureus infection: Secondary | ICD-10-CM

## 2014-06-01 DIAGNOSIS — E109 Type 1 diabetes mellitus without complications: Secondary | ICD-10-CM | POA: Diagnosis present

## 2014-06-01 DIAGNOSIS — F411 Generalized anxiety disorder: Secondary | ICD-10-CM

## 2014-06-01 DIAGNOSIS — E1010 Type 1 diabetes mellitus with ketoacidosis without coma: Secondary | ICD-10-CM | POA: Diagnosis present

## 2014-06-01 DIAGNOSIS — J45998 Other asthma: Secondary | ICD-10-CM

## 2014-06-01 DIAGNOSIS — L03119 Cellulitis of unspecified part of limb: Secondary | ICD-10-CM | POA: Diagnosis present

## 2014-06-01 DIAGNOSIS — Z87898 Personal history of other specified conditions: Secondary | ICD-10-CM

## 2014-06-01 DIAGNOSIS — F329 Major depressive disorder, single episode, unspecified: Secondary | ICD-10-CM | POA: Diagnosis present

## 2014-06-01 DIAGNOSIS — R509 Fever, unspecified: Secondary | ICD-10-CM | POA: Diagnosis present

## 2014-06-01 HISTORY — DX: Cellulitis, unspecified: L03.90

## 2014-06-01 LAB — CBC
HEMATOCRIT: 39.3 % (ref 36.0–46.0)
Hemoglobin: 13.4 g/dL (ref 12.0–15.0)
MCH: 31.8 pg (ref 26.0–34.0)
MCHC: 34.1 g/dL (ref 30.0–36.0)
MCV: 93.3 fL (ref 78.0–100.0)
Platelets: 296 10*3/uL (ref 150–400)
RBC: 4.21 MIL/uL (ref 3.87–5.11)
RDW: 12.6 % (ref 11.5–15.5)
WBC: 8.2 10*3/uL (ref 4.0–10.5)

## 2014-06-01 LAB — BASIC METABOLIC PANEL
Anion gap: 12 (ref 5–15)
BUN: 11 mg/dL (ref 6–23)
CALCIUM: 8.8 mg/dL (ref 8.4–10.5)
CO2: 22 meq/L (ref 19–32)
CREATININE: 0.57 mg/dL (ref 0.50–1.10)
Chloride: 103 mEq/L (ref 96–112)
GFR calc Af Amer: >90 mL/min (ref >90)
GFR calc non Af Amer: >90 mL/min (ref >90)
GLUCOSE: 150 mg/dL — AB (ref 70–99)
Potassium: 4.6 mEq/L (ref 3.7–5.3)
Sodium: 137 mEq/L (ref 137–147)

## 2014-06-01 LAB — CBC WITH DIFFERENTIAL/PLATELET
Basophils Absolute: 0 10*3/uL (ref 0.0–0.1)
Basophils Relative: 0 % (ref 0–1)
EOS PCT: 3 % (ref 0–5)
Eosinophils Absolute: 0.3 10*3/uL (ref 0.0–0.7)
HCT: 40.2 % (ref 36.0–46.0)
Hemoglobin: 14 g/dL (ref 12.0–15.0)
Lymphocytes Relative: 34 % (ref 12–46)
Lymphs Abs: 2.7 10*3/uL (ref 0.7–4.0)
MCH: 31.7 pg (ref 26.0–34.0)
MCHC: 34.8 g/dL (ref 30.0–36.0)
MCV: 91 fL (ref 78.0–100.0)
MONOS PCT: 8 % (ref 3–12)
Monocytes Absolute: 0.6 10*3/uL (ref 0.1–1.0)
Neutro Abs: 4.3 10*3/uL (ref 1.7–7.7)
Neutrophils Relative %: 55 % (ref 43–77)
PLATELETS: 295 10*3/uL (ref 150–400)
RBC: 4.42 MIL/uL (ref 3.87–5.11)
RDW: 12.5 % (ref 11.5–15.5)
WBC: 8 10*3/uL (ref 4.0–10.5)

## 2014-06-01 LAB — URINE MICROSCOPIC-ADD ON

## 2014-06-01 LAB — SURGICAL PCR SCREEN
MRSA, PCR: NEGATIVE
Staphylococcus aureus: NEGATIVE

## 2014-06-01 LAB — GLUCOSE, CAPILLARY
GLUCOSE-CAPILLARY: 63 mg/dL — AB (ref 70–99)
GLUCOSE-CAPILLARY: 194 mg/dL — AB (ref 70–99)
GLUCOSE-CAPILLARY: 94 mg/dL (ref 70–99)
GLUCOSE-CAPILLARY: 135 mg/dL — AB (ref 70–99)
Glucose-Capillary: 50 mg/dL — ABNORMAL LOW (ref 70–99)

## 2014-06-01 LAB — URINALYSIS, ROUTINE W REFLEX MICROSCOPIC
Bilirubin Urine: NEGATIVE
Glucose, UA: 100 mg/dL — AB
HGB URINE DIPSTICK: NEGATIVE
Ketones, ur: NEGATIVE mg/dL
Nitrite: NEGATIVE
Protein, ur: NEGATIVE mg/dL
SPECIFIC GRAVITY, URINE: 1.023 (ref 1.005–1.030)
Urobilinogen, UA: 0.2 mg/dL (ref 0.0–1.0)
pH: 5.5 (ref 5.0–8.0)

## 2014-06-01 LAB — CREATININE, SERUM
Creatinine, Ser: 0.65 mg/dL (ref 0.50–1.10)
GFR calc non Af Amer: >90 mL/min (ref >90)

## 2014-06-01 LAB — HEMOGLOBIN A1C
Hgb A1c MFr Bld: 10.2 % — ABNORMAL HIGH (ref <5.7)
Mean Plasma Glucose: 246 mg/dL — ABNORMAL HIGH (ref <117)

## 2014-06-01 LAB — I-STAT CG4 LACTIC ACID, ED: LACTIC ACID, VENOUS: 0.63 mmol/L (ref 0.5–2.2)

## 2014-06-01 LAB — CBG MONITORING, ED: Glucose-Capillary: 155 mg/dL — ABNORMAL HIGH (ref 70–99)

## 2014-06-01 LAB — PREGNANCY, URINE: Preg Test, Ur: NEGATIVE

## 2014-06-01 MED ORDER — LORATADINE 10 MG PO TABS
10.0000 mg | ORAL_TABLET | Freq: Every day | ORAL | Status: DC
  Administered 2014-06-01 – 2014-06-04 (×3): 10 mg via ORAL
  Filled 2014-06-01 (×4): qty 1

## 2014-06-01 MED ORDER — ONDANSETRON 8 MG PO TBDP
8.0000 mg | ORAL_TABLET | Freq: Three times a day (TID) | ORAL | Status: DC | PRN
  Filled 2014-06-01: qty 1

## 2014-06-01 MED ORDER — INSULIN ASPART 100 UNIT/ML ~~LOC~~ SOLN
0.0000 [IU] | Freq: Three times a day (TID) | SUBCUTANEOUS | Status: DC
  Administered 2014-06-01: 4 [IU] via SUBCUTANEOUS
  Administered 2014-06-02: 7 [IU] via SUBCUTANEOUS
  Administered 2014-06-02 – 2014-06-03 (×2): 3 [IU] via SUBCUTANEOUS
  Administered 2014-06-03: 11 [IU] via SUBCUTANEOUS
  Administered 2014-06-03: 20 [IU] via SUBCUTANEOUS
  Administered 2014-06-04: 15 [IU] via SUBCUTANEOUS

## 2014-06-01 MED ORDER — VANCOMYCIN HCL IN DEXTROSE 1-5 GM/200ML-% IV SOLN
1000.0000 mg | Freq: Two times a day (BID) | INTRAVENOUS | Status: DC
  Administered 2014-06-02 – 2014-06-04 (×5): 1000 mg via INTRAVENOUS
  Filled 2014-06-01 (×6): qty 200

## 2014-06-01 MED ORDER — ACETAMINOPHEN 325 MG PO TABS: 650.0000 mg | ORAL_TABLET | Freq: Four times a day (QID) | ORAL | Status: DC | PRN

## 2014-06-01 MED ORDER — ENOXAPARIN SODIUM 40 MG/0.4ML ~~LOC~~ SOLN
40.0000 mg | SUBCUTANEOUS | Status: DC
  Administered 2014-06-01 – 2014-06-03 (×3): 40 mg via SUBCUTANEOUS
  Filled 2014-06-01 (×5): qty 0.4

## 2014-06-01 MED ORDER — VANCOMYCIN HCL IN DEXTROSE 1-5 GM/200ML-% IV SOLN
1000.0000 mg | Freq: Once | INTRAVENOUS | Status: AC
  Administered 2014-06-01: 1000 mg via INTRAVENOUS
  Filled 2014-06-01: qty 200

## 2014-06-01 MED ORDER — DOCUSATE SODIUM 100 MG PO CAPS
100.0000 mg | ORAL_CAPSULE | Freq: Two times a day (BID) | ORAL | Status: DC
  Administered 2014-06-01 – 2014-06-03 (×4): 100 mg via ORAL
  Filled 2014-06-01 (×4): qty 1

## 2014-06-01 MED ORDER — VANCOMYCIN HCL 10 G IV SOLR
1500.0000 mg | Freq: Once | INTRAVENOUS | Status: AC
  Administered 2014-06-01: 1500 mg via INTRAVENOUS
  Filled 2014-06-01: qty 1500

## 2014-06-01 MED ORDER — SODIUM CHLORIDE 0.9 % IJ SOLN
3.0000 mL | Freq: Two times a day (BID) | INTRAMUSCULAR | Status: DC
Start: 2014-06-01 — End: 2014-06-04
  Administered 2014-06-01 – 2014-06-03 (×3): 3 mL via INTRAVENOUS

## 2014-06-01 MED ORDER — ACETAMINOPHEN 650 MG RE SUPP
650.0000 mg | Freq: Four times a day (QID) | RECTAL | Status: DC | PRN
Start: 2014-06-01 — End: 2014-06-04

## 2014-06-01 MED ORDER — ONDANSETRON HCL 4 MG PO TABS: 4.0000 mg | ORAL_TABLET | Freq: Four times a day (QID) | ORAL | Status: DC | PRN

## 2014-06-01 MED ORDER — ONDANSETRON HCL 4 MG/2ML IJ SOLN
4.0000 mg | Freq: Four times a day (QID) | INTRAMUSCULAR | Status: DC | PRN
  Administered 2014-06-01 – 2014-06-03 (×2): 4 mg via INTRAVENOUS
  Filled 2014-06-01 (×2): qty 2

## 2014-06-01 MED ORDER — SACCHAROMYCES BOULARDII 250 MG PO CAPS
250.0000 mg | ORAL_CAPSULE | Freq: Two times a day (BID) | ORAL | Status: DC
  Administered 2014-06-01 – 2014-06-04 (×5): 250 mg via ORAL
  Filled 2014-06-01 (×7): qty 1

## 2014-06-01 MED ORDER — SODIUM CHLORIDE 0.9 % IJ SOLN: 3.0000 mL | INTRAMUSCULAR | Status: DC | PRN

## 2014-06-01 MED ORDER — HYDROMORPHONE HCL PF 1 MG/ML IJ SOLN
1.0000 mg | INTRAMUSCULAR | Status: DC | PRN
  Administered 2014-06-01: 1 mg via INTRAVENOUS
  Filled 2014-06-01: qty 1

## 2014-06-01 MED ORDER — ONDANSETRON HCL 4 MG/2ML IJ SOLN
4.0000 mg | Freq: Once | INTRAMUSCULAR | Status: AC
  Administered 2014-06-01: 4 mg via INTRAVENOUS
  Filled 2014-06-01: qty 2

## 2014-06-01 MED ORDER — SODIUM CHLORIDE 0.9 % IV SOLN
250.0000 mL | INTRAVENOUS | Status: DC | PRN
  Administered 2014-06-01: 250 mL via INTRAVENOUS

## 2014-06-01 MED ORDER — HYDROCODONE-ACETAMINOPHEN 5-325 MG PO TABS
1.0000 | ORAL_TABLET | ORAL | Status: DC | PRN
  Administered 2014-06-01 – 2014-06-03 (×5): 2 via ORAL
  Filled 2014-06-01 (×5): qty 2

## 2014-06-01 MED ORDER — INSULIN ASPART 100 UNIT/ML ~~LOC~~ SOLN: 1.0000 [IU] | Freq: Three times a day (TID) | SUBCUTANEOUS | Status: DC

## 2014-06-01 MED ORDER — HYDROMORPHONE HCL PF 1 MG/ML IJ SOLN
1.0000 mg | Freq: Once | INTRAMUSCULAR | Status: AC
  Administered 2014-06-01: 1 mg via INTRAVENOUS
  Filled 2014-06-01: qty 1

## 2014-06-01 MED ORDER — INSULIN GLARGINE 100 UNIT/ML ~~LOC~~ SOLN
50.0000 [IU] | Freq: Every morning | SUBCUTANEOUS | Status: DC
  Administered 2014-06-03 – 2014-06-04 (×2): 50 [IU] via SUBCUTANEOUS
  Filled 2014-06-01 (×3): qty 0.5

## 2014-06-01 NOTE — ED Notes (Signed)
Pt reports abscess to right inner thigh; fever, chills, n/v for about a week. Is taking doxycycline for prior abscess. Pt is a x 4

## 2014-06-01 NOTE — Consult Note (Signed)
Reason for Consult:right thigh cellulitis and hidradenitis Referring Physician: [name] is an 33 y.o. female.  HPI: [name] is well-known to me. She has a significant history of diabetes as well as hidradenitis. She has had multiple procedures regarding her hidradenitis. Most recently, she has developed saline as an infection with an open wound in her right groin and proximal right thigh. She initially improved significantly on oral antibiotics but presented to the emergency department today with significant cellulitis and tenderness of her right thigh.  Past Medical History  Diagnosis Date  . Hx MRSA infection   . Migraine   . Vitamin D insufficiency   . Asthma     as a child  . GERD (gastroesophageal reflux disease)     no current meds.  . Hidradenitis 04/2012    bilat. thighs, left groin - open areas on thighs  . Diabetes mellitus     IDDM, Insulin pump; followed by Dr. [name]  . Abscess of left axilla - PREVOTELLA BIVIA & Staph Coag Neg 07/12/2012    MODERATE PREVOTELLA BIVIA Note: BETA LACTAMASE NEGATIVE    . PCOS (polycystic ovarian syndrome)   . Heart murmur   . Pneumonia     hx  . Depression     hx  . Cancer     skin tag rt breast  . Hydradenitis     Past Surgical History  Procedure Laterality Date  . Wisdom tooth extraction    . Tonsillectomy    . Bunionectomy      left  . Esophagogastroduodenoscopy  11/17/2011    Procedure: ESOPHAGOGASTRODUODENOSCOPY (EGD);  Surgeon: [name], MD;  Location: [facility];  Service: Endoscopy;  Laterality: N/A;  . Eye surgery      exc. stye left eye  . Breast surgery      right lumpectomy  . Cholecystectomy  12/02/2006    lap. chole.  . Dilation and evacuation  02/14/2007  . Hydradenitis excision  04/30/2012    Procedure: EXCISION HYDRADENITIS GROIN;  Surgeon: [name], MD;  Location: [facility];  Service: General;  Laterality: Left;  wide excision hidradenitis bilateral thighs  and Left groin  . Hydradenitis excision Left 01/13/2014    Procedure: WIDE EXCISION HIDRADENITIS LEFT AXILLA;  Surgeon: [name], MD;  Location: [facility];  Service: General;  Laterality: Left;    No family history on file.  Social History:  reports that she has been smoking Cigarettes.  She has a 10.5 pack-year smoking history. She has never used smokeless tobacco. She reports that she does not drink alcohol or use illicit drugs.  Allergies:  Allergies  Allergen Reactions  . Latex Hives, Shortness Of Breath and Rash  . Levofloxacin Shortness Of Breath and Rash  . Moxifloxacin Shortness Of Breath and Rash  . Oxycodone-Acetaminophen Shortness Of Breath, Swelling and Rash    NORCO/VICODIN OK  . Peach [Prunus Persica] Hives and Shortness Of Breath  . Penicillins Swelling and Rash  . Propoxyphene N-Acetaminophen Swelling    SWELLING OF FACE AND THROAT  . Rosiglitazone Maleate Swelling    SWELLING OF FACE AND LEGS  . Xolair [Omalizumab]     Rash and anaphyllaxis  . Adhesive [Tape] Hives, Itching and Rash  . Potassium-Containing Compounds Other (See Comments)    IV ROUTE - CAUSES VEINS TO COLLAPSE  . Prednisone Other (See Comments)    SEVERE ELEVATION OF BLOOD SUGAR. Able to tolerate 40 mg  . Morphine And Related  Other (See Comments)    Causes hallucinations  . Cefaclor Rash  . Keflex [Cephalexin] Diarrhea and Rash  . Promethazine Hcl Other (See Comments)    IV ROUTE ONLY - JITTERY FEELING. Patient reports that it is mild and she has used promethazine since then  . Sulfadiazine Rash    Medications: I have reviewed the patient's current medications.  Results for orders placed during the hospital encounter of 06/01/14 (from the past 48 hour(s))  CBC WITH DIFFERENTIAL     Status: None   Collection Time    06/01/14  8:40 AM      Result Value Ref Range   WBC 8.0  4.0 - 10.5 K/uL   RBC 4.42  3.87 - 5.11 MIL/uL   Hemoglobin 14.0  12.0 - 15.0 g/dL   HCT 40.2  36.0 - 46.0 %    MCV 91.0  78.0 - 100.0 fL   MCH 31.7  26.0 - 34.0 pg   MCHC 34.8  30.0 - 36.0 g/dL   RDW 12.5  11.5 - 15.5 %   Platelets 295  150 - 400 K/uL   Neutrophils Relative % 55  43 - 77 %   Neutro Abs 4.3  1.7 - 7.7 K/uL   Lymphocytes Relative 34  12 - 46 %   Lymphs Abs 2.7  0.7 - 4.0 K/uL   Monocytes Relative 8  3 - 12 %   Monocytes Absolute 0.6  0.1 - 1.0 K/uL   Eosinophils Relative 3  0 - 5 %   Eosinophils Absolute 0.3  0.0 - 0.7 K/uL   Basophils Relative 0  0 - 1 %   Basophils Absolute 0.0  0.0 - 0.1 K/uL  BASIC METABOLIC PANEL     Status: Abnormal   Collection Time    06/01/14  8:40 AM      Result Value Ref Range   Sodium 137  137 - 147 mEq/L   Potassium 4.6  3.7 - 5.3 mEq/L   Comment: HEMOLYSIS AT THIS LEVEL MAY AFFECT RESULT   Chloride 103  96 - 112 mEq/L   CO2 22  19 - 32 mEq/L   Glucose, Bld 150 (*) 70 - 99 mg/dL   BUN 11  6 - 23 mg/dL   Creatinine, Ser 0.57  0.50 - 1.10 mg/dL   Calcium 8.8  8.4 - 10.5 mg/dL   GFR calc non Af Amer >90  >90 mL/min   GFR calc Af Amer >90  >90 mL/min   Comment: (NOTE)     The eGFR has been calculated using the CKD EPI equation.     This calculation has not been validated in all clinical situations.     eGFR's persistently <90 mL/min signify possible Chronic Kidney     Disease.   Anion gap 12  5 - 15  CBG MONITORING, ED     Status: Abnormal   Collection Time    06/01/14  9:03 AM      Result Value Ref Range   Glucose-Capillary 155 (*) 70 - 99 mg/dL  I-STAT CG4 LACTIC ACID, ED     Status: None   Collection Time    06/01/14  9:06 AM      Result Value Ref Range   Lactic Acid, Venous 0.63  0.5 - 2.2 mmol/L  PREGNANCY, URINE     Status: None   Collection Time    06/01/14  9:11 AM      Result Value Ref Range   Preg Test,  Ur NEGATIVE  NEGATIVE   Comment:            THE SENSITIVITY OF THIS     METHODOLOGY IS >20 mIU/mL.  URINALYSIS, ROUTINE W REFLEX MICROSCOPIC     Status: Abnormal   Collection Time    06/01/14  9:11 AM      Result Value  Ref Range   Color, Urine AMBER (*) YELLOW   Comment: BIOCHEMICALS MAY BE AFFECTED BY COLOR   APPearance CLOUDY (*) CLEAR   Specific Gravity, Urine 1.023  1.005 - 1.030   pH 5.5  5.0 - 8.0   Glucose, UA 100 (*) NEGATIVE mg/dL   Hgb urine dipstick NEGATIVE  NEGATIVE   Bilirubin Urine NEGATIVE  NEGATIVE   Ketones, ur NEGATIVE  NEGATIVE mg/dL   Protein, ur NEGATIVE  NEGATIVE mg/dL   Urobilinogen, UA 0.2  0.0 - 1.0 mg/dL   Nitrite NEGATIVE  NEGATIVE   Leukocytes, UA TRACE (*) NEGATIVE  URINE MICROSCOPIC-ADD ON     Status: Abnormal   Collection Time    06/01/14  9:11 AM      Result Value Ref Range   Squamous Epithelial / LPF FEW (*) RARE   WBC, UA 0-2  <3 WBC/hpf   RBC / HPF 0-2  <3 RBC/hpf   Bacteria, UA FEW (*) RARE   Urine-Other MUCOUS PRESENT      No results found.  Review of Systems  All other systems reviewed and are negative.  Blood pressure 96/59, pulse 77, temperature 97.9 F (36.6 C), temperature source Oral, resp. rate 14, height '5\' 5"'  (1.651 m), weight 221 lb (100.245 kg), last menstrual period 05/21/2014, SpO2 100.00%. Physical Exam  Skin:  There is marked erythema of the right proximal inner thigh with multiple skin changes consistent with her hidradenitis.    Assessment/Plan: Right thigh cellulitis with a history of chronic hidradenitis  I believe this area will need incision and drainage in the operating room tomorrow. I discussed this with her in detail. I discussed the risks which include but not limited to bleeding, ongoing infection, need for further surgery, etc. Eventually, we'll plan a wide excision of this area at a later date  Daquon Greenleaf A 06/01/2014, 1:40 PM

## 2014-06-01 NOTE — Progress Notes (Signed)
ANTIBIOTIC CONSULT NOTE - INITIAL  Pharmacy Consult for Vancomycin Indication: Cellulitis   Allergies  Allergen Reactions  . Latex Hives, Shortness Of Breath and Rash  . Levofloxacin Shortness Of Breath and Rash  . Moxifloxacin Shortness Of Breath and Rash  . Oxycodone-Acetaminophen Shortness Of Breath, Swelling and Rash    NORCO/VICODIN OK  . Peach [Prunus Persica] Hives and Shortness Of Breath  . Penicillins Swelling and Rash  . Propoxyphene N-Acetaminophen Swelling    SWELLING OF FACE AND THROAT  . Rosiglitazone Maleate Swelling    SWELLING OF FACE AND LEGS  . Xolair [Omalizumab]     Rash and anaphyllaxis  . Adhesive [Tape] Hives, Itching and Rash  . Potassium-Containing Compounds Other (See Comments)    IV ROUTE - CAUSES VEINS TO COLLAPSE  . Prednisone Other (See Comments)    SEVERE ELEVATION OF BLOOD SUGAR. Able to tolerate 40 mg  . Morphine And Related Other (See Comments)    Causes hallucinations  . Cefaclor Rash  . Keflex [Cephalexin] Diarrhea and Rash  . Promethazine Hcl Other (See Comments)    IV ROUTE ONLY - JITTERY FEELING. Patient reports that it is mild and she has used promethazine since then  . Sulfadiazine Rash    Patient Measurements: Height: 5\' 5"  (165.1 cm) Weight: 221 lb (100.245 kg) IBW/kg (Calculated) : 57 Adjusted Body Weight: n/a  Vital Signs: Temp: 98 F (36.7 C) (09/10 1346) Temp src: Oral (09/10 1346) BP: 96/66 mmHg (09/10 1346) Pulse Rate: 73 (09/10 1346) Intake/Output from previous day:   Intake/Output from this shift:    Labs:  Recent Labs  06/01/14 0840  WBC 8.0  HGB 14.0  PLT 295  CREATININE 0.57   Estimated Creatinine Clearance: 118.4 ml/min (by C-G formula based on Cr of 0.57). No results found for this basename: VANCOTROUGH, VANCOPEAK, VANCORANDOM, GENTTROUGH, GENTPEAK, GENTRANDOM, TOBRATROUGH, TOBRAPEAK, TOBRARND, AMIKACINPEAK, AMIKACINTROU, AMIKACIN,  in the last 72 hours   Microbiology: No results found for this  or any previous visit (from the past 720 hour(s)).  Medical History: Past Medical History  Diagnosis Date  . Hx MRSA infection   . Migraine   . Vitamin D insufficiency   . Asthma     as a child  . GERD (gastroesophageal reflux disease)     no current meds.  . Hidradenitis 04/2012    bilat. thighs, left groin - open areas on thighs  . Diabetes mellitus     IDDM, Insulin pump; followed by Dr. [name]  . Abscess of left axilla - PREVOTELLA BIVIA & Staph Coag Neg 07/12/2012    MODERATE PREVOTELLA BIVIA Note: BETA LACTAMASE NEGATIVE    . PCOS (polycystic ovarian syndrome)   . Heart murmur   . Pneumonia     hx  . Depression     hx  . Cancer     skin tag rt breast  . Hydradenitis     Medications:  Prescriptions prior to admission  Medication Sig Dispense Refill  . clindamycin (CLEOCIN) 150 MG capsule Take 150 mg by mouth 3 (three) times daily.      . clindamycin (CLINDAGEL) 1 % gel Apply 1 application topically 2 (two) times daily as needed (boil / rash).       Marland Kitchen doxycycline (VIBRAMYCIN) 50 MG capsule Take 50 mg by mouth 2 (two) times daily.      . fexofenadine (ALLEGRA) 180 MG tablet Take 180 mg by mouth daily as needed.      Marland Kitchen HYDROcodone-acetaminophen (NORCO/VICODIN) 5-325  MG per tablet Take 1-2 tablets by mouth every 4 (four) hours as needed for moderate pain.      Marland Kitchen insulin aspart (NOVOLOG) 100 UNIT/ML injection Inject 1-10 Units into the skin 3 (three) times daily before meals.       . insulin glargine (LANTUS) 100 UNIT/ML injection Inject 50 Units into the skin every morning.       . ondansetron (ZOFRAN ODT) 8 MG disintegrating tablet Take 1 tablet (8 mg total) by mouth every 8 (eight) hours as needed for nausea or vomiting.  20 tablet  0   Assessment: 89 YOF with significant history of DM and hidradenitis presented with continued cellulitis on her right thigh despite outpatient treatment with doxycycline and clindamycin. Pharmacy consulted to start empiric vancomycin.  WBC wnl. Pt is afebrile. LA wnl. SCr 0.57. CrCl > 100 mL/min. Pt already received Vancomycin 1 gm IV today.   Goal of Therapy:  Vancomycin trough level 10-15 mcg/ml  Plan:  -Give an additional Vancomycin IV 1500 mg to finish load followed by 1 gm IV Q 12 hours  -Monitor CBC, renal fx, cultures and patient's clinical progress -[facility] at [facility], PharmD.  Clinical Pharmacist Pager [phone]

## 2014-06-01 NOTE — ED Provider Notes (Signed)
CSN: [id number]     Arrival date & time 06/01/14  0867 History   First MD Initiated Contact with Patient 06/01/14 0820     Chief Complaint  Patient presents with  . Abscess  . Chills     (Consider location/radiation/quality/duration/timing/severity/associated sxs/prior Treatment) HPI Comments: Patient is a 33 yo F PMHx significant for hidradenitis, DM, GERD, Asthma presenting to the ED for continued cellulitic infection to her right inner thigh with subjective fevers, chills, nausea, non-bloody non-bilious emesis, and non-bloody diarrhea since last ED visit. Patient states she has had four more abscesses pop up since last ED visit, one drained last evening. Patient states she followed up with Dr. Ninfa Linden on 6 days ago and was placed on Doxycycline and told to return to the ED for worsening symptoms. No I&Ds have been performed on this area.   Patient is a 33 y.o. female presenting with abscess.  Abscess Associated symptoms: fever (subjective), nausea and vomiting     Past Medical History  Diagnosis Date  . Hx MRSA infection   . Migraine   . Vitamin D insufficiency   . Asthma     as a child  . GERD (gastroesophageal reflux disease)     no current meds.  . Hidradenitis 04/2012    bilat. thighs, left groin - open areas on thighs  . Diabetes mellitus     IDDM, Insulin pump; followed by Dr. [name]  . Abscess of left axilla - PREVOTELLA BIVIA & Staph Coag Neg 07/12/2012    MODERATE PREVOTELLA BIVIA Note: BETA LACTAMASE NEGATIVE    . PCOS (polycystic ovarian syndrome)   . Heart murmur   . Pneumonia     hx  . Depression     hx  . Cancer     skin tag rt breast  . Hydradenitis    Past Surgical History  Procedure Laterality Date  . Wisdom tooth extraction    . Tonsillectomy    . Bunionectomy      left  . Esophagogastroduodenoscopy  11/17/2011    Procedure: ESOPHAGOGASTRODUODENOSCOPY (EGD);  Surgeon: [name], MD;  Location: [facility];  Service: Endoscopy;   Laterality: N/A;  . Eye surgery      exc. stye left eye  . Breast surgery      right lumpectomy  . Cholecystectomy  12/02/2006    lap. chole.  . Dilation and evacuation  02/14/2007  . Hydradenitis excision  04/30/2012    Procedure: EXCISION HYDRADENITIS GROIN;  Surgeon: [name], MD;  Location: [facility];  Service: General;  Laterality: Left;  wide excision hidradenitis bilateral thighs and Left groin  . Hydradenitis excision Left 01/13/2014    Procedure: WIDE EXCISION HIDRADENITIS LEFT AXILLA;  Surgeon: [name], MD;  Location: [facility];  Service: General;  Laterality: Left;   No family history on file. History  Substance Use Topics  . Smoking status: Current Some Day Smoker -- 0.75 packs/day for 14 years    Types: Cigarettes  . Smokeless tobacco: Never Used     Comment: 5 cig./day  . Alcohol Use: No   OB History   Grav Para Term Preterm Abortions TAB SAB Ect Mult Living   4    3  3   1      Review of Systems  Constitutional: Positive for fever (subjective) and chills.  Gastrointestinal: Positive for nausea, vomiting, abdominal pain and diarrhea.  Skin: Positive for color change.  All other systems reviewed and are negative.  Allergies  Latex; Levofloxacin; Moxifloxacin; Oxycodone-acetaminophen; Peach; Penicillins; Propoxyphene n-acetaminophen; Rosiglitazone maleate; Xolair; Adhesive; Potassium-containing compounds; Prednisone; Morphine and related; Cefaclor; Keflex; Promethazine hcl; and Sulfadiazine  Home Medications   Prior to Admission medications   Medication Sig Start Date End Date Taking? Authorizing Provider  clindamycin (CLEOCIN) 150 MG capsule Take 150 mg by mouth 3 (three) times daily.   Yes Historical Provider, MD  clindamycin (CLINDAGEL) 1 % gel Apply 1 application topically 2 (two) times daily as needed (boil / rash).    Yes Historical Provider, MD  doxycycline (VIBRAMYCIN) 50 MG capsule Take 50 mg by mouth 2 (two) times daily.    Yes Historical Provider, MD  fexofenadine (ALLEGRA) 180 MG tablet Take 180 mg by mouth daily as needed. 05/30/14  Yes Rigoberto Noel, MD  HYDROcodone-acetaminophen (NORCO/VICODIN) 5-325 MG per tablet Take 1-2 tablets by mouth every 4 (four) hours as needed for moderate pain.   Yes Historical Provider, MD  insulin aspart (NOVOLOG) 100 UNIT/ML injection Inject 1-10 Units into the skin 3 (three) times daily before meals.    Yes Historical Provider, MD  insulin glargine (LANTUS) 100 UNIT/ML injection Inject 50 Units into the skin every morning.  05/10/13  Yes Charlynne Cousins, MD  ondansetron (ZOFRAN ODT) 8 MG disintegrating tablet Take 1 tablet (8 mg total) by mouth every 8 (eight) hours as needed for nausea or vomiting. 05/25/14  Yes Tatyana A Kirichenko, PA-C   BP 96/66  Pulse 73  Temp(Src) 98 F (36.7 C) (Oral)  Resp 16  Ht 5\' 5"  (1.651 m)  Wt 221 lb (100.245 kg)  BMI 36.78 kg/m2  SpO2 100%  LMP 05/21/2014 Physical Exam  Nursing note and vitals reviewed. Constitutional: She is oriented to person, place, and time. She appears well-developed and well-nourished. No distress.  HENT:  Head: Normocephalic and atraumatic.  Right Ear: External ear normal.  Left Ear: External ear normal.  Nose: Nose normal.  Mouth/Throat: Oropharynx is clear and moist.  Eyes: Conjunctivae are normal.  Neck: Normal range of motion. Neck supple.  Cardiovascular: Normal rate, regular rhythm and normal heart sounds.   Pulmonary/Chest: Effort normal and breath sounds normal. No respiratory distress.  Abdominal: Soft. Bowel sounds are normal. There is no tenderness.  Musculoskeletal: Normal range of motion.  Neurological: She is alert and oriented to person, place, and time.  Skin: Skin is warm and dry. She is not diaphoretic.     Psychiatric: She has a normal mood and affect.    ED Course  Procedures (including critical care time) Medications  loratadine (CLARITIN) tablet 10 mg (10 mg Oral Given 06/01/14  1434)  HYDROcodone-acetaminophen (NORCO/VICODIN) 5-325 MG per tablet 1-2 tablet (2 tablets Oral Given 06/01/14 1401)  ondansetron (ZOFRAN-ODT) disintegrating tablet 8 mg (not administered)  insulin aspart (novoLOG) injection 1-10 Units (not administered)  insulin glargine (LANTUS) injection 50 Units (not administered)  enoxaparin (LOVENOX) injection 40 mg (40 mg Subcutaneous Given 06/01/14 1434)  sodium chloride 0.9 % injection 3 mL (3 mLs Intravenous Given 06/01/14 1401)  sodium chloride 0.9 % injection 3 mL (not administered)  0.9 %  sodium chloride infusion (250 mLs Intravenous New Bag/Given 06/01/14 1515)  acetaminophen (TYLENOL) tablet 650 mg (not administered)    Or  acetaminophen (TYLENOL) suppository 650 mg (not administered)  HYDROmorphone (DILAUDID) injection 1 mg (not administered)  docusate sodium (COLACE) capsule 100 mg (not administered)  ondansetron (ZOFRAN) tablet 4 mg ( Oral See Alternative 06/01/14 1146)    Or  ondansetron (ZOFRAN) injection 4  mg (4 mg Intravenous Given 06/01/14 1146)  insulin aspart (novoLOG) injection 0-20 Units (not administered)  saccharomyces boulardii (FLORASTOR) capsule 250 mg (not administered)  vancomycin (VANCOCIN) 1,500 mg in sodium chloride 0.9 % 500 mL IVPB (1,500 mg Intravenous Given 06/01/14 1515)  vancomycin (VANCOCIN) IVPB 1000 mg/200 mL premix (not administered)  ondansetron (ZOFRAN) injection 4 mg (4 mg Intravenous Given 06/01/14 0913)  HYDROmorphone (DILAUDID) injection 1 mg (1 mg Intravenous Given 06/01/14 1016)  vancomycin (VANCOCIN) IVPB 1000 mg/200 mL premix (0 mg Intravenous Stopped 06/01/14 1143)    Labs Review Labs Reviewed  BASIC METABOLIC PANEL - Abnormal; Notable for the following:    Glucose, Bld 150 (*)    All other components within normal limits  URINALYSIS, ROUTINE W REFLEX MICROSCOPIC - Abnormal; Notable for the following:    Color, Urine AMBER (*)    APPearance CLOUDY (*)    Glucose, UA 100 (*)    Leukocytes, UA TRACE  (*)    All other components within normal limits  URINE MICROSCOPIC-ADD ON - Abnormal; Notable for the following:    Squamous Epithelial / LPF FEW (*)    Bacteria, UA FEW (*)    All other components within normal limits  GLUCOSE, CAPILLARY - Abnormal; Notable for the following:    Glucose-Capillary 63 (*)    All other components within normal limits  CBG MONITORING, ED - Abnormal; Notable for the following:    Glucose-Capillary 155 (*)    All other components within normal limits  CBC WITH DIFFERENTIAL  PREGNANCY, URINE  CBC  CREATININE, SERUM  HEMOGLOBIN A1C  I-STAT CG4 LACTIC ACID, ED    Imaging Review No results found.   EKG Interpretation None      MDM   Final diagnoses:  Cellulitis of right lower extremity    Filed Vitals:   06/01/14 1346  BP: 96/66  Pulse: 73  Temp: 98 F (36.7 C)  Resp: 16   Afebrile, NAD, non-toxic appearing, AAOx4.   Patient with failed outpatient therapy for cellulitis with worsening cellulitis. No obvious abscess noted on examination. I have reviewed nursing notes, vital signs, and all appropriate lab and imaging results for this patient. Started patient on Vancomycin. General surgery will be consulted on the floor. Patient admitted for further management and evaluation. Patient d/w with Dr. Wilson Singer, agrees with plan.        Harlow Mares, PA-C 06/01/14 1523

## 2014-06-01 NOTE — H&P (Signed)
[facility] History and Physical  LOGAN VEGH IEP:[id number] DOB: 08-10-1981 DOA: 06/01/2014  Referring physician:  PCP: Vidal Schwalbe, MD  Specialists: Dr. [name], general surgery  Chief Complaint: Abscess/Cellulitis  HPI: [name] is a 33 y.o. female with a history of diabetes mellitus type 1, GERD, hidradenitis, that presents to the emergency department for continued cellulitis on her right and her thigh. Patient stated this has been ongoing since August. She has recently seen Dr. Ninfa Linden.  Patient on doxycycline as well as clindamycin however neither of these medications have helped her cellulitis. Patient also complained of fever as well as nausea and vomiting and some diarrhea. Patient does not know how high her fever was at home. She states that she has been dealing with these type of infections for years. Patient denies any recent changes with her hygiene, new detergents, ill contacts, some exposure.  Review of Systems:  Constitutional: Complains of fever. HEENT: Denies photophobia, eye pain, redness, hearing loss, ear pain, congestion, sore throat, rhinorrhea, sneezing, mouth sores, trouble swallowing, neck pain, neck stiffness and tinnitus.   Respiratory: Denies SOB, DOE, cough, chest tightness,  and wheezing.   Cardiovascular: Denies chest pain, palpitations and leg swelling.  Gastrointestinal: Complains of nausea/vomiting/diarrhea.  Genitourinary: Denies dysuria, urgency, frequency, hematuria, flank pain and difficulty urinating.  Musculoskeletal: Denies myalgias, back pain, joint swelling, arthralgias and gait problem.  Skin: Complains of worsening cellulitis on her right inner thigh. Neurological: Denies dizziness, seizures, syncope, weakness, light-headedness, numbness and headaches.  Hematological: Denies adenopathy. Easy bruising, personal or family bleeding history  Psychiatric/Behavioral: Denies suicidal ideation, mood changes, confusion,  nervousness, sleep disturbance and agitation  Past Medical History  Diagnosis Date  . Hx MRSA infection   . Migraine   . Vitamin D insufficiency   . Asthma     as a child  . GERD (gastroesophageal reflux disease)     no current meds.  . Hidradenitis 04/2012    bilat. thighs, left groin - open areas on thighs  . Diabetes mellitus     IDDM, Insulin pump; followed by Dr. [name]  . Abscess of left axilla - PREVOTELLA BIVIA & Staph Coag Neg 07/12/2012    MODERATE PREVOTELLA BIVIA Note: BETA LACTAMASE NEGATIVE    . PCOS (polycystic ovarian syndrome)   . Heart murmur   . Pneumonia     hx  . Depression     hx  . Cancer     skin tag rt breast  . Hydradenitis    Past Surgical History  Procedure Laterality Date  . Wisdom tooth extraction    . Tonsillectomy    . Bunionectomy      left  . Esophagogastroduodenoscopy  11/17/2011    Procedure: ESOPHAGOGASTRODUODENOSCOPY (EGD);  Surgeon: [name], MD;  Location: [facility];  Service: Endoscopy;  Laterality: N/A;  . Eye surgery      exc. stye left eye  . Breast surgery      right lumpectomy  . Cholecystectomy  12/02/2006    lap. chole.  . Dilation and evacuation  02/14/2007  . Hydradenitis excision  04/30/2012    Procedure: EXCISION HYDRADENITIS GROIN;  Surgeon: [name], MD;  Location: [facility];  Service: General;  Laterality: Left;  wide excision hidradenitis bilateral thighs and Left groin  . Hydradenitis excision Left 01/13/2014    Procedure: WIDE EXCISION HIDRADENITIS LEFT AXILLA;  Surgeon: [name], MD;  Location: [facility];  Service: General;  Laterality: Left;  Social History:  reports that she has been smoking Cigarettes.  She has a 10.5 pack-year smoking history. She has never used smokeless tobacco. She reports that she does not drink alcohol or use illicit drugs.   Allergies  Allergen Reactions  . Latex Hives, Shortness Of Breath and Rash  . Levofloxacin Shortness Of Breath  and Rash  . Moxifloxacin Shortness Of Breath and Rash  . Oxycodone-Acetaminophen Shortness Of Breath, Swelling and Rash    NORCO/VICODIN OK  . Peach [Prunus Persica] Hives and Shortness Of Breath  . Penicillins Swelling and Rash  . Propoxyphene N-Acetaminophen Swelling    SWELLING OF FACE AND THROAT  . Rosiglitazone Maleate Swelling    SWELLING OF FACE AND LEGS  . Xolair [Omalizumab]     Rash and anaphyllaxis  . Adhesive [Tape] Hives, Itching and Rash  . Potassium-Containing Compounds Other (See Comments)    IV ROUTE - CAUSES VEINS TO COLLAPSE  . Prednisone Other (See Comments)    SEVERE ELEVATION OF BLOOD SUGAR. Able to tolerate 40 mg  . Morphine And Related Other (See Comments)    Causes hallucinations  . Cefaclor Rash  . Keflex [Cephalexin] Diarrhea and Rash  . Promethazine Hcl Other (See Comments)    IV ROUTE ONLY - JITTERY FEELING. Patient reports that it is mild and she has used promethazine since then  . Sulfadiazine Rash    No family history on file.   Prior to Admission medications   Medication Sig Start Date End Date Taking? Authorizing Provider  clindamycin (CLEOCIN) 150 MG capsule Take 150 mg by mouth 3 (three) times daily.   Yes Historical Provider, MD  clindamycin (CLINDAGEL) 1 % gel Apply 1 application topically 2 (two) times daily as needed (boil / rash).    Yes Historical Provider, MD  doxycycline (VIBRAMYCIN) 50 MG capsule Take 50 mg by mouth 2 (two) times daily.   Yes Historical Provider, MD  fexofenadine (ALLEGRA) 180 MG tablet Take 180 mg by mouth daily as needed. 05/30/14  Yes Rigoberto Noel, MD  HYDROcodone-acetaminophen (NORCO/VICODIN) 5-325 MG per tablet Take 1-2 tablets by mouth every 4 (four) hours as needed for moderate pain.   Yes Historical Provider, MD  insulin aspart (NOVOLOG) 100 UNIT/ML injection Inject 1-10 Units into the skin 3 (three) times daily before meals.    Yes Historical Provider, MD  insulin glargine (LANTUS) 100 UNIT/ML injection Inject  50 Units into the skin every morning.  05/10/13  Yes Charlynne Cousins, MD  ondansetron (ZOFRAN ODT) 8 MG disintegrating tablet Take 1 tablet (8 mg total) by mouth every 8 (eight) hours as needed for nausea or vomiting. 05/25/14  Yes Tatyana A Kirichenko, PA-C   Physical Exam: Filed Vitals:   06/01/14 1053  BP: 91/58  Pulse:   Temp:   Resp: 15     General: Well developed, well nourished, NAD, appears stated age  HEENT: NCAT, PERRLA, EOMI, Anicteic Sclera, mucous membranes moist.   Neck: Supple, no JVD, no masses  Cardiovascular: S1 S2 auscultated, no rubs, murmurs or gallops. Regular rate and rhythm.  Respiratory: Clear to auscultation bilaterally with equal chest rise  Abdomen: Soft, obese, nontender, nondistended, + bowel sounds  Extremities: warm dry without cyanosis clubbing or edema.   Neuro: AAOx3, cranial nerves grossly intact. Strength 5/5 in patient's upper and lower extremities bilaterally  Skin:  Right inner thigh cellulitis with drainage, Right axilla- several areas consistent with hidradenitis, left axilla-well-healed scar  Psych: Normal affect and demeanor with intact judgement  and insight  Labs on Admission:  Basic Metabolic Panel:  Recent Labs Lab 05/25/14 1157 06/01/14 0840  NA 139 137  K 3.9 4.6  CL 103 103  CO2 22 22  GLUCOSE 145* 150*  BUN 11 11  CREATININE 0.58 0.57  CALCIUM 9.4 8.8   Liver Function Tests:  Recent Labs Lab 05/25/14 1157  AST 13  ALT 9  ALKPHOS 103  BILITOT 0.4  PROT 7.2  ALBUMIN 3.6   No results found for this basename: LIPASE, AMYLASE,  in the last 168 hours No results found for this basename: AMMONIA,  in the last 168 hours CBC:  Recent Labs Lab 05/25/14 1157 06/01/14 0840  WBC 8.2 8.0  NEUTROABS 4.8 4.3  HGB 14.7 14.0  HCT 42.7 40.2  MCV 92.0 91.0  PLT 307 295   Cardiac Enzymes: No results found for this basename: CKTOTAL, CKMB, CKMBINDEX, TROPONINI,  in the last 168 hours  BNP (last 3 results) No  results found for this basename: PROBNP,  in the last 8760 hours CBG:  Recent Labs Lab 05/25/14 1329 05/25/14 1446 05/25/14 1538 06/01/14 0903  GLUCAP 52* 59* 91 155*    Radiological Exams on Admission: No results found.  EKG: None  Assessment/Plan  Cellulitis of the right thigh/hidradenitis -Patient will be admitted to medical floor. -Patient failed outpatient doxycycline and clindamycin -Will place her on IV vancomycin with pharmacy to dose and follow -Dr. Ninfa Linden, general surgeon consulted -Will provide pain control as needed -The patient has been on several antibiotics, will start her on Florastor  Diabetes mellitus, type 1 -Will continue patient's home regimen as well as add on insulin sliding scale and CBG monitoring -Will also order hemoglobin A1c  Seasonal allergies -Continue loratadine  Tobacco abuse -Smoking cessation counseling  Nausea/Vomiting -Likely secondary to patient's cellulitis -Will continue to monitor and place her on antiemetics.  DVT prophylaxis: Lovenox  Code Status: Full  Condition: Guarded  Family Communication: Family at bedside. Admission, patients condition and plan of care including tests being ordered have been discussed with the patient and family who indicate understanding and agree with the plan and Code Status.  Disposition Plan: Admitted   Time spent: 60 minutes  [name] D.O. [facility] Pager [phone]  If 7PM-7AM, please contact night-coverage [email or web address] Password [email or web address] 06/01/2014, 11:27 AM

## 2014-06-01 NOTE — ED Notes (Signed)
CBG 155 

## 2014-06-01 NOTE — ED Notes (Signed)
Lab results given to Dr.Kohut.

## 2014-06-02 ENCOUNTER — Inpatient Hospital Stay (HOSPITAL_COMMUNITY): Payer: 59 | Admitting: Certified Registered"

## 2014-06-02 ENCOUNTER — Encounter (HOSPITAL_COMMUNITY)
Admission: EM | Disposition: A | Discharge status: Home / Self Care | Payer: Self-pay | Source: Home / Self Care | Attending: Internal Medicine

## 2014-06-02 ENCOUNTER — Encounter (HOSPITAL_COMMUNITY): Payer: 59 | Admitting: Certified Registered"

## 2014-06-02 DIAGNOSIS — L02419 Cutaneous abscess of limb, unspecified: Principal | ICD-10-CM

## 2014-06-02 DIAGNOSIS — L03119 Cellulitis of unspecified part of limb: Principal | ICD-10-CM

## 2014-06-02 DIAGNOSIS — F17200 Nicotine dependence, unspecified, uncomplicated: Secondary | ICD-10-CM

## 2014-06-02 DIAGNOSIS — E109 Type 1 diabetes mellitus without complications: Secondary | ICD-10-CM

## 2014-06-02 DIAGNOSIS — K219 Gastro-esophageal reflux disease without esophagitis: Secondary | ICD-10-CM

## 2014-06-02 DIAGNOSIS — L732 Hidradenitis suppurativa: Secondary | ICD-10-CM

## 2014-06-02 HISTORY — PX: IRRIGATION AND DEBRIDEMENT ABSCESS: SHX5252

## 2014-06-02 LAB — BASIC METABOLIC PANEL
Anion gap: 12 (ref 5–15)
BUN: 11 mg/dL (ref 6–23)
CO2: 24 mEq/L (ref 19–32)
Calcium: 8.2 mg/dL — ABNORMAL LOW (ref 8.4–10.5)
Chloride: 106 mEq/L (ref 96–112)
Creatinine, Ser: 0.65 mg/dL (ref 0.50–1.10)
GFR calc Af Amer: >90 mL/min (ref >90)
GFR calc non Af Amer: >90 mL/min (ref >90)
Glucose, Bld: 149 mg/dL — ABNORMAL HIGH (ref 70–99)
POTASSIUM: 4 meq/L (ref 3.7–5.3)
Sodium: 142 mEq/L (ref 137–147)

## 2014-06-02 LAB — GLUCOSE, CAPILLARY
GLUCOSE-CAPILLARY: 144 mg/dL — AB (ref 70–99)
GLUCOSE-CAPILLARY: 232 mg/dL — AB (ref 70–99)
Glucose-Capillary: 129 mg/dL — ABNORMAL HIGH (ref 70–99)
Glucose-Capillary: 131 mg/dL — ABNORMAL HIGH (ref 70–99)
Glucose-Capillary: 220 mg/dL — ABNORMAL HIGH (ref 70–99)

## 2014-06-02 LAB — CBC
HCT: 36.4 % (ref 36.0–46.0)
HEMOGLOBIN: 12.1 g/dL (ref 12.0–15.0)
MCH: 30.6 pg (ref 26.0–34.0)
MCHC: 33.2 g/dL (ref 30.0–36.0)
MCV: 92.2 fL (ref 78.0–100.0)
Platelets: 271 10*3/uL (ref 150–400)
RBC: 3.95 MIL/uL (ref 3.87–5.11)
RDW: 12.6 % (ref 11.5–15.5)
WBC: 6.1 10*3/uL (ref 4.0–10.5)

## 2014-06-02 SURGERY — IRRIGATION AND DEBRIDEMENT ABSCESS
Anesthesia: General | Site: Thigh | Laterality: Right

## 2014-06-02 MED ORDER — FENTANYL CITRATE 0.05 MG/ML IJ SOLN
25.0000 ug | INTRAMUSCULAR | Status: DC | PRN
  Administered 2014-06-02 (×2): 50 ug via INTRAVENOUS

## 2014-06-02 MED ORDER — HYDROMORPHONE HCL PF 1 MG/ML IJ SOLN
1.0000 mg | INTRAMUSCULAR | Status: DC | PRN
  Administered 2014-06-02 – 2014-06-03 (×4): 1 mg via INTRAVENOUS
  Filled 2014-06-02 (×5): qty 1

## 2014-06-02 MED ORDER — BUPIVACAINE-EPINEPHRINE (PF) 0.5% -1:200000 IJ SOLN
INTRAMUSCULAR | Status: DC | PRN
  Administered 2014-06-02: 30 mL

## 2014-06-02 MED ORDER — FENTANYL CITRATE 0.05 MG/ML IJ SOLN
INTRAMUSCULAR | Status: AC
  Filled 2014-06-02: qty 5

## 2014-06-02 MED ORDER — KETOROLAC TROMETHAMINE 30 MG/ML IJ SOLN
INTRAMUSCULAR | Status: DC | PRN
  Administered 2014-06-02: 30 mg via INTRAVENOUS

## 2014-06-02 MED ORDER — LIDOCAINE HCL (CARDIAC) 20 MG/ML IV SOLN
INTRAVENOUS | Status: DC | PRN
  Administered 2014-06-02: 80 mg via INTRAVENOUS

## 2014-06-02 MED ORDER — ONDANSETRON HCL 4 MG/2ML IJ SOLN
INTRAMUSCULAR | Status: DC | PRN
Start: 2014-06-02 — End: 2014-06-02
  Administered 2014-06-02: 4 mg via INTRAVENOUS

## 2014-06-02 MED ORDER — LACTATED RINGERS IV SOLN
INTRAVENOUS | Status: DC | PRN
  Administered 2014-06-02: 09:00:00 via INTRAVENOUS

## 2014-06-02 MED ORDER — SUCCINYLCHOLINE CHLORIDE 20 MG/ML IJ SOLN
INTRAMUSCULAR | Status: AC
  Filled 2014-06-02: qty 1

## 2014-06-02 MED ORDER — ROCURONIUM BROMIDE 50 MG/5ML IV SOLN
INTRAVENOUS | Status: AC
  Filled 2014-06-02: qty 1

## 2014-06-02 MED ORDER — MIDAZOLAM HCL 5 MG/5ML IJ SOLN
INTRAMUSCULAR | Status: DC | PRN
  Administered 2014-06-02: 2 mg via INTRAVENOUS

## 2014-06-02 MED ORDER — BUPIVACAINE-EPINEPHRINE (PF) 0.5% -1:200000 IJ SOLN
INTRAMUSCULAR | Status: AC
  Filled 2014-06-02: qty 30

## 2014-06-02 MED ORDER — MIDAZOLAM HCL 2 MG/2ML IJ SOLN
INTRAMUSCULAR | Status: AC
  Filled 2014-06-02: qty 2

## 2014-06-02 MED ORDER — PROPOFOL 10 MG/ML IV BOLUS
INTRAVENOUS | Status: DC | PRN
  Administered 2014-06-02: 180 mg via INTRAVENOUS

## 2014-06-02 MED ORDER — 0.9 % SODIUM CHLORIDE (POUR BTL) OPTIME
TOPICAL | Status: DC | PRN
  Administered 2014-06-02: 1000 mL

## 2014-06-02 MED ORDER — FENTANYL CITRATE 0.05 MG/ML IJ SOLN
INTRAMUSCULAR | Status: DC | PRN
  Administered 2014-06-02 (×3): 50 ug via INTRAVENOUS

## 2014-06-02 MED ORDER — PROPOFOL 10 MG/ML IV BOLUS
INTRAVENOUS | Status: AC
  Filled 2014-06-02: qty 20

## 2014-06-02 MED ORDER — FENTANYL CITRATE 0.05 MG/ML IJ SOLN
INTRAMUSCULAR | Status: AC
  Filled 2014-06-02: qty 2

## 2014-06-02 SURGICAL SUPPLY — 32 items
CANISTER SUCTION 2500CC (MISCELLANEOUS) ×2 IMPLANT
COVER SURGICAL LIGHT HANDLE (MISCELLANEOUS) ×2 IMPLANT
DRAPE LAPAROSCOPIC ABDOMINAL (DRAPES) ×1 IMPLANT
DRAPE UTILITY 15X26 W/TAPE STR (DRAPE) ×4 IMPLANT
DRSG PAD ABDOMINAL 8X10 ST (GAUZE/BANDAGES/DRESSINGS) IMPLANT
ELECT CAUTERY BLADE 6.4 (BLADE) ×2 IMPLANT
ELECT REM PT RETURN 9FT ADLT (ELECTROSURGICAL) ×2
ELECTRODE REM PT RTRN 9FT ADLT (ELECTROSURGICAL) ×1 IMPLANT
GAUZE SPONGE 2X2 8PLY STRL LF (GAUZE/BANDAGES/DRESSINGS) IMPLANT
GAUZE SPONGE 4X4 12PLY STRL (GAUZE/BANDAGES/DRESSINGS) IMPLANT
GLOVE BIOGEL PI IND STRL 8 (GLOVE) ×1 IMPLANT
GLOVE BIOGEL PI INDICATOR 8 (GLOVE) ×1
GLOVE ECLIPSE 8.0 STRL XLNG CF (GLOVE) ×2 IMPLANT
GOWN STRL REUS W/ TWL LRG LVL3 (GOWN DISPOSABLE) ×2 IMPLANT
GOWN STRL REUS W/TWL LRG LVL3 (GOWN DISPOSABLE) ×4
KIT BASIN OR (CUSTOM PROCEDURE TRAY) ×2 IMPLANT
KIT ROOM TURNOVER OR (KITS) ×2 IMPLANT
NDL HYPO 25GX1X1/2 BEV (NEEDLE) IMPLANT
NEEDLE HYPO 25GX1X1/2 BEV (NEEDLE) ×2 IMPLANT
NS IRRIG 1000ML POUR BTL (IV SOLUTION) ×2 IMPLANT
PACK GENERAL/GYN (CUSTOM PROCEDURE TRAY) ×2 IMPLANT
PAD ARMBOARD 7.5X6 YLW CONV (MISCELLANEOUS) ×2 IMPLANT
PENCIL BUTTON HOLSTER BLD 10FT (ELECTRODE) ×1 IMPLANT
SPONGE GAUZE 2X2 STER 10/PKG (GAUZE/BANDAGES/DRESSINGS) ×1
SPONGE GAUZE 4X4 12PLY STER LF (GAUZE/BANDAGES/DRESSINGS) ×1 IMPLANT
SWAB COLLECTION DEVICE MRSA (MISCELLANEOUS) ×1 IMPLANT
SYRINGE CONTROL L 12CC (SYRINGE) ×2 IMPLANT
SYRINGE CONTROL LL 12CC (SYRINGE) IMPLANT
TAPE CLOTH SURG 4X10 WHT LF (GAUZE/BANDAGES/DRESSINGS) ×1 IMPLANT
TOWEL OR 17X24 6PK STRL BLUE (TOWEL DISPOSABLE) ×2 IMPLANT
TOWEL OR 17X26 10 PK STRL BLUE (TOWEL DISPOSABLE) ×2 IMPLANT
TUBE ANAEROBIC SPECIMEN COL (MISCELLANEOUS) ×1 IMPLANT

## 2014-06-02 NOTE — Op Note (Signed)
NAMEMarland Kitchen  [name], [name] NO.:  [id number]  MEDICAL RECORD NO.:  [id number]  LOCATION:  [facility]                         FACILITY:  [facility]  PHYSICIAN:  [name], M.D. DATE OF BIRTH:  1981-05-28  DATE OF PROCEDURE:  06/02/2014 DATE OF DISCHARGE:                              OPERATIVE REPORT   PREOPERATIVE DIAGNOSIS:  Right thigh abscess.  POSTOPERATIVE DIAGNOSIS:  Right thigh abscess.  PROCEDURE:  Incision and drainage of right thigh abscess.  SURGEON:  [name], MD  ANESTHESIA:  General and 0.5% Marcaine with epinephrine.  ESTIMATED BLOOD LOSS:  Minimal.  INDICATIONS:  This is a 33 year old female with chronic hidradenitis. She has had wide excision in both her axilla and groins.  She has an area draining on her right proximal thigh.  Decision will be made to proceed with incision and drainage.  FINDINGS:  The patient was found to have findings consistent with hidradenitis.  There was minimal purulence and no large abscess cavity identified.  Cultures were obtained.  PROCEDURE IN DETAIL:  The patient was brought to the operating room, identified as [name].  She was placed supine on the operating room table and anesthesia was induced.  She was then placed in the frog- leg position.  Her right thigh was then prepped and draped in usual sterile fashion.  She had a small opening consistent with chronic hidradenitis.  There was thin purulent fluid which I obtained cultures of.  I then anesthetized the skin further with Marcaine and lengthened the incision and excised the chronic granulation tissue with a scalpel. I then probed the wound extensively and the area of induration found no large abscess cavity or further purulence.  I cauterized the remaining granulation tissue and then anesthetized the wound further with Marcaine.  I then packed it with wet-to-dry saline gauze.  Dry gauze was placed over this.  The patient tolerated procedure well.   All the counts were correct at the end of procedure.  The patient was then extubated in the operating room and taken in stable condition to recovery room.    [name], M.D.    DB/MEDQ  D:  06/02/2014  T:  06/02/2014  Job:  [id number]

## 2014-06-02 NOTE — Anesthesia Procedure Notes (Signed)
Procedure Name: LMA Insertion Date/Time: 06/02/2014 9:35 AM Performed by: Maeola Harman Pre-anesthesia Checklist: Patient identified, Emergency Drugs available, Suction available, Patient being monitored and Timeout performed Patient Re-evaluated:Patient Re-evaluated prior to inductionOxygen Delivery Method: Circle system utilized Preoxygenation: Pre-oxygenation with 100% oxygen Intubation Type: IV induction Ventilation: Mask ventilation without difficulty LMA: LMA inserted LMA Size: 4.0 Tube type: Oral Number of attempts: 1 Placement Confirmation: positive ETCO2 and breath sounds checked- equal and bilateral Tube secured with: Tape Dental Injury: Teeth and Oropharynx as per pre-operative assessment

## 2014-06-02 NOTE — Op Note (Signed)
IRRIGATION AND DEBRIDEMENT ABSCESS  Procedure Note  [name] 06/01/2014 - 06/02/2014   Pre-op Diagnosis: Right Thigh abscess     Post-op Diagnosis: same  Procedure(s): IRRIGATION AND DEBRIDEMENT ABSCESS  Surgeon(s): [name], MD  Anesthesia: General  Staff:  Circulator: Jaynie Crumble, RN Scrub Person: Leslie Andrea, CST; [place] Circulator Assistant: Tomma Rakers Sipsis, RN  Estimated Blood Loss: Minimal               Cultures sent          [facility]   Date: 06/02/2014  Time: 10:01 AM

## 2014-06-02 NOTE — Anesthesia Preprocedure Evaluation (Addendum)
Anesthesia Evaluation  Patient identified by MRN, date of birth, ID band Patient awake    Reviewed: Allergy & Precautions, H&P , NPO status , Patient's Chart, lab work & pertinent test results, reviewed documented beta blocker date and time   Airway Mallampati: II TM Distance: >3 FB     Dental  (+) Teeth Intact, Dental Advisory Given   Pulmonary asthma , pneumonia -, resolved, Current Smoker,  breath sounds clear to auscultation        Cardiovascular Rhythm:Regular     Neuro/Psych  Headaches, Anxiety Depression    GI/Hepatic Neg liver ROS, GERD-  Medicated and Controlled,  Endo/Other  diabetes, Well Controlled, Type 1, Insulin Dependent  Renal/GU negative Renal ROS     Musculoskeletal negative musculoskeletal ROS (+)   Abdominal (+)  Abdomen: soft. Bowel sounds: normal.  Peds  Hematology negative hematology ROS (+)   Anesthesia Other Findings   Reproductive/Obstetrics negative OB ROS                         Anesthesia Physical Anesthesia Plan  ASA: III  Anesthesia Plan: General   Post-op Pain Management:    Induction: Intravenous  Airway Management Planned: Oral ETT  Additional Equipment:   Intra-op Plan:   Post-operative Plan: Extubation in OR  Informed Consent: I have reviewed the patients History and Physical, chart, labs and discussed the procedure including the risks, benefits and alternatives for the proposed anesthesia with the patient or authorized representative who has indicated his/her understanding and acceptance.   Dental advisory given  Plan Discussed with: CRNA and Anesthesiologist  Anesthesia Plan Comments:         Anesthesia Quick Evaluation

## 2014-06-02 NOTE — Progress Notes (Signed)
Patient ID: [name]  female  [id number]    DOB: [birth year]    DOA: 06/01/2014  PCP: Vidal Schwalbe, MD  Assessment/Plan: Principal Problem:   Cellulitis of right thigh with history of Hidradenitis suppurativa - Continue IV vancomycin, surgery consulted - Planned I&D in the OR today - Continue pain control  Active Problems:   Esophageal reflux - Continue PPI   Tobacco abuse - Patient counseled on smoking cessation    DKA, type 1uncontrolled  -  hemoglobin A1c 10.2, continue sliding scale insulin   DVT Prophylaxis: Lovenox   Code Status: full code   Family Communication:  Disposition:  Consultants:   general surgery   Procedures:   I&D   Antibiotics:   IV vancomycin     Subjective:  patient seen and examined prior to surgery, pain controlled, significant induration area in the inner right thigh  Objective: Weight change:   Intake/Output Summary (Last 24 hours) at 06/02/14 1108 Last data filed at 06/02/14 1000  Gross per 24 hour  Intake   1422 ml  Output    350 ml  Net   1072 ml   Blood pressure 102/65, pulse 55, temperature 98.2 F (36.8 C), temperature source Oral, resp. rate 12, height 5\' 5"  (1.651 m), weight 100.245 kg (221 lb), last menstrual period 05/21/2014, SpO2 99.00%.  Physical Exam: General: Alert and awake, oriented x3, not in any acute distress. CVS: S1-S2 clear, no murmur rubs or gallops Chest: clear to auscultation bilaterally, no wheezing, rales or rhonchi Abdomen: soft nontender, nondistended, normal bowel sounds  Extremities: no cyanosis, clubbing or edema noted bilaterally, significantly erythematous, indurated, tender area in the inner right thigh  Neuro: Cranial nerves II-XII intact, no focal neurological deficits  Lab Results: Basic Metabolic Panel:  Recent Labs Lab 06/01/14 0840 06/01/14 1503 06/02/14 0500  NA 137  --  142  K 4.6  --  4.0  CL 103  --  106  CO2 22  --  24  GLUCOSE 150*  --  149*  BUN  11  --  11  CREATININE 0.57 0.65 0.65  CALCIUM 8.8  --  8.2*   Liver Function Tests: No results found for this basename: AST, ALT, ALKPHOS, BILITOT, PROT, ALBUMIN,  in the last 168 hours No results found for this basename: LIPASE, AMYLASE,  in the last 168 hours No results found for this basename: AMMONIA,  in the last 168 hours CBC:  Recent Labs Lab 06/01/14 0840 06/01/14 1503 06/02/14 0500  WBC 8.0 8.2 6.1  NEUTROABS 4.3  --   --   HGB 14.0 13.4 12.1  HCT 40.2 39.3 36.4  MCV 91.0 93.3 92.2  PLT 295 296 271   Cardiac Enzymes: No results found for this basename: CKTOTAL, CKMB, CKMBINDEX, TROPONINI,  in the last 168 hours BNP: No components found with this basename: POCBNP,  CBG:  Recent Labs Lab 06/01/14 2013 06/01/14 2031 06/01/14 2219 06/02/14 0755 06/02/14 1003  GLUCAP 50* 94 135* 129* 144*     Micro Results: Recent Results (from the past 240 hour(s))  SURGICAL PCR SCREEN     Status: None   Collection Time    06/01/14  4:15 PM      Result Value Ref Range Status   MRSA, PCR NEGATIVE  NEGATIVE Final   Staphylococcus aureus NEGATIVE  NEGATIVE Final   Comment:            The Xpert SA Assay (FDA     approved for  NASAL specimens     in patients over 3 years of age),     is one component of     a comprehensive surveillance     program.  Test performance has     been validated by [employer] for patients greater     than or equal to 34 year old.     It is not intended     to diagnose infection nor to     guide or monitor treatment.    Studies/Results: No results found.  Medications: Scheduled Meds: . [facility] HOLD] docusate sodium  100 mg Oral BID  . [MAR HOLD] enoxaparin (LOVENOX) injection  40 mg Subcutaneous Q24H  . fentaNYL      . [MAR HOLD] insulin aspart  0-20 Units Subcutaneous TID WC  . [MAR HOLD] insulin glargine  50 Units Subcutaneous q morning - 10a  . [facility] HOLD] loratadine  10 mg Oral Daily  . [MAR HOLD] saccharomyces boulardii  250 mg  Oral BID  . [MAR HOLD] sodium chloride  3 mL Intravenous Q12H  . [facility] HOLD] vancomycin  1,000 mg Intravenous Q12H      LOS: 1 day   Shailynn Fong M.D. [facility] 06/02/2014, 11:08 AM Pager: [phone]  If 7PM-7AM, please contact night-coverage [email or web address] Password [email or web address]  **Disclaimer: This note was dictated with voice recognition software. Similar sounding words can inadvertently be transcribed and this note may contain transcription errors which may not have been corrected upon publication of note.**

## 2014-06-02 NOTE — Transfer of Care (Signed)
Immediate Anesthesia Transfer of Care Note  Patient: [name]  Procedure(s) Performed: Procedure(s): IRRIGATION AND DEBRIDEMENT ABSCESS (Right)  Patient Location: PACU  Anesthesia Type:General  Level of Consciousness: awake, alert  and oriented  Airway & Oxygen Therapy: Patient connected to face mask oxygen  Post-op Assessment: Report given to PACU RN  Post vital signs: stable  Complications: No apparent anesthesia complications

## 2014-06-02 NOTE — Anesthesia Postprocedure Evaluation (Signed)
  Anesthesia Post-op Note  Patient: [name]  Procedure(s) Performed: Procedure(s): IRRIGATION AND DEBRIDEMENT ABSCESS (Right)  Patient Location: PACU  Anesthesia Type:General  Level of Consciousness: awake  Airway and Oxygen Therapy: Patient Spontanous Breathing  Post-op Pain: mild  Post-op Assessment: Post-op Vital signs reviewed  Post-op Vital Signs: Reviewed  Last Vitals:  Filed Vitals:   06/02/14 1128  BP: 98/62  Pulse: 64  Temp: 36.5 C  Resp: 12    Complications: No apparent anesthesia complications

## 2014-06-03 LAB — GLUCOSE, CAPILLARY
GLUCOSE-CAPILLARY: 254 mg/dL — AB (ref 70–99)
Glucose-Capillary: 359 mg/dL — ABNORMAL HIGH (ref 70–99)
Glucose-Capillary: 141 mg/dL — ABNORMAL HIGH (ref 70–99)
Glucose-Capillary: 349 mg/dL — ABNORMAL HIGH (ref 70–99)

## 2014-06-03 MED ORDER — INSULIN ASPART 100 UNIT/ML ~~LOC~~ SOLN
4.0000 [IU] | Freq: Three times a day (TID) | SUBCUTANEOUS | Status: DC
  Administered 2014-06-03 – 2014-06-04 (×3): 4 [IU] via SUBCUTANEOUS

## 2014-06-03 MED ORDER — POLYETHYLENE GLYCOL 3350 17 G PO PACK
17.0000 g | PACK | Freq: Every day | ORAL | Status: DC
  Administered 2014-06-03: 17 g via ORAL
  Filled 2014-06-03 (×2): qty 1

## 2014-06-03 NOTE — Progress Notes (Signed)
Patient ID: [name]  female  [id number]    DOB: [birth year]    DOA: 06/01/2014  PCP: Vidal Schwalbe, MD  Assessment/Plan: Principal Problem:   Cellulitis of right thigh with history of Hidradenitis suppurativa: Postop day 1 - Continue IV vancomycin, surgery following, after I&D in the OR - Continue pain control  Active Problems:   Esophageal reflux - Continue PPI   Tobacco abuse - Patient counseled on smoking cessation    DKA, type 1uncontrolled  -  hemoglobin A1c 10.2, continue sliding scale insulin - Continue Lantus, added meal coverage  DVT Prophylaxis: Lovenox   Code Status: full code   Family Communication:  Disposition: Hopefully tomorrow  Consultants:   general surgery   Procedures:   I&D   Antibiotics:   IV vancomycin     Subjective:  patient seen and examined, feels area of I&D is very tender  Objective: Weight change:   Intake/Output Summary (Last 24 hours) at 06/03/14 1150 Last data filed at 06/03/14 0949  Gross per 24 hour  Intake   1180 ml  Output      2 ml  Net   1178 ml   Blood pressure 108/68, pulse 91, temperature 98 F (36.7 C), temperature source Oral, resp. rate 17, height 5\' 5"  (1.651 m), weight 100.245 kg (221 lb), last menstrual period 05/21/2014, SpO2 96.00%.  Physical Exam: General: Alert and awake, oriented x3, not in any acute distress. CVS: S1-S2 clear, no murmur rubs or gallops Chest: clear to auscultation bilaterally, no wheezing, rales or rhonchi Abdomen: soft nontender, nondistended, normal bowel sounds  Extremities: No sinusitis clubbing or edema    Lab Results: Basic Metabolic Panel:  Recent Labs Lab 06/01/14 0840 06/01/14 1503 06/02/14 0500  NA 137  --  142  K 4.6  --  4.0  CL 103  --  106  CO2 22  --  24  GLUCOSE 150*  --  149*  BUN 11  --  11  CREATININE 0.57 0.65 0.65  CALCIUM 8.8  --  8.2*   Liver Function Tests: No results found for this basename: AST, ALT, ALKPHOS, BILITOT,  PROT, ALBUMIN,  in the last 168 hours No results found for this basename: LIPASE, AMYLASE,  in the last 168 hours No results found for this basename: AMMONIA,  in the last 168 hours CBC:  Recent Labs Lab 06/01/14 0840 06/01/14 1503 06/02/14 0500  WBC 8.0 8.2 6.1  NEUTROABS 4.3  --   --   HGB 14.0 13.4 12.1  HCT 40.2 39.3 36.4  MCV 91.0 93.3 92.2  PLT 295 296 271   Cardiac Enzymes: No results found for this basename: CKTOTAL, CKMB, CKMBINDEX, TROPONINI,  in the last 168 hours BNP: No components found with this basename: POCBNP,  CBG:  Recent Labs Lab 06/02/14 1130 06/02/14 1653 06/02/14 2125 06/03/14 0746 06/03/14 1140  GLUCAP 131* 232* 220* 359* 254*     Micro Results: Recent Results (from the past 240 hour(s))  SURGICAL PCR SCREEN     Status: None   Collection Time    06/01/14  4:15 PM      Result Value Ref Range Status   MRSA, PCR NEGATIVE  NEGATIVE Final   Staphylococcus aureus NEGATIVE  NEGATIVE Final   Comment:            The Xpert SA Assay (FDA     approved for NASAL specimens     in patients over 76 years of age),  is one component of     a comprehensive surveillance     program.  Test performance has     been validated by [facility] for patients greater     than or equal to 6 year old.     It is not intended     to diagnose infection nor to     guide or monitor treatment.  CULTURE, ROUTINE-ABSCESS     Status: None   Collection Time    06/02/14  9:21 AM      Result Value Ref Range Status   Specimen Description ABSCESS THIGH RIGHT   Final   Special Requests INNER THIGH   Final   Gram Stain     Final   Value: FEW WBC PRESENT, PREDOMINANTLY MONONUCLEAR     NO SQUAMOUS EPITHELIAL CELLS SEEN     NO ORGANISMS SEEN     Performed at [facility]   Culture     Final   Value: NO GROWTH 1 DAY     Performed at [facility]   Report Status PENDING   Incomplete  ANAEROBIC CULTURE     Status: None   Collection Time    06/02/14   9:21 AM      Result Value Ref Range Status   Specimen Description ABSCESS THIGH RIGHT   Final   Special Requests INNER THIGH   Final   Gram Stain     Final   Value: FEW WBC PRESENT, PREDOMINANTLY MONONUCLEAR     NO SQUAMOUS EPITHELIAL CELLS SEEN     NO ORGANISMS SEEN     Performed at [facility]   Culture     Final   Value: NO ANAEROBES ISOLATED; CULTURE IN PROGRESS FOR 5 DAYS     Performed at [facility]   Report Status PENDING   Incomplete    Studies/Results: No results found.  Medications: Scheduled Meds: . docusate sodium  100 mg Oral BID  . enoxaparin (LOVENOX) injection  40 mg Subcutaneous Q24H  . insulin aspart  0-20 Units Subcutaneous TID WC  . insulin aspart  4 Units Subcutaneous TID WC  . insulin glargine  50 Units Subcutaneous q morning - 10a  . loratadine  10 mg Oral Daily  . polyethylene glycol  17 g Oral Daily  . saccharomyces boulardii  250 mg Oral BID  . sodium chloride  3 mL Intravenous Q12H  . vancomycin  1,000 mg Intravenous Q12H      LOS: 2 days   RAI,RIPUDEEP M.D. [facility] 06/03/2014, 11:50 AM Pager: [phone]  If 7PM-7AM, please contact night-coverage [email or web address] Password [email or web address]  **Disclaimer: This note was dictated with voice recognition software. Similar sounding words can inadvertently be transcribed and this note may contain transcription errors which may not have been corrected upon publication of note.**

## 2014-06-03 NOTE — Progress Notes (Signed)
1 Day Post-Op  Subjective: No complaints  Objective: Vital signs in last 24 hours: Temp:  [97.3 F (36.3 C)-99.8 F (37.7 C)] 98 F (36.7 C) (09/12 0559) Pulse Rate:  [55-98] 91 (09/12 0559) Resp:  [11-17] 17 (09/12 0559) BP: (98-122)/(62-75) 108/68 mmHg (09/12 0559) SpO2:  [96 %-100 %] 96 % (09/12 0559) Last BM Date: 06/01/14  Intake/Output from previous day: 09/11 0701 - 09/12 0700 In: 1300 [P.O.:700; I.V.:600] Out: 2 [Urine:2] Intake/Output this shift: Total I/O In: 240 [P.O.:240] Out: -   Resp: clear to auscultation bilaterally Cardio: regular rate and rhythm GI: soft, non-tender; bowel sounds normal; no masses,  no organomegaly Extremities: wound clean but very tender with dressing change  Lab Results:   Recent Labs  06/01/14 1503 06/02/14 0500  WBC 8.2 6.1  HGB 13.4 12.1  HCT 39.3 36.4  PLT 296 271   BMET  Recent Labs  06/01/14 0840 06/01/14 1503 06/02/14 0500  NA 137  --  142  K 4.6  --  4.0  CL 103  --  106  CO2 22  --  24  GLUCOSE 150*  --  149*  BUN 11  --  11  CREATININE 0.57 0.65 0.65  CALCIUM 8.8  --  8.2*   PT/INR No results found for this basename: LABPROT, INR,  in the last 72 hours ABG No results found for this basename: PHART, PCO2, PO2, HCO3,  in the last 72 hours  Studies/Results: No results found.  Anti-infectives: Anti-infectives   Start     Dose/Rate Route Frequency Ordered Stop   06/02/14 0300  vancomycin (VANCOCIN) IVPB 1000 mg/200 mL premix     1,000 mg 200 mL/hr over 60 Minutes Intravenous Every 12 hours 06/01/14 1403     06/01/14 1500  vancomycin (VANCOCIN) 1,500 mg in sodium chloride 0.9 % 500 mL IVPB     1,500 mg 250 mL/hr over 120 Minutes Intravenous  Once 06/01/14 1403 06/01/14 1715   06/01/14 1015  vancomycin (VANCOCIN) IVPB 1000 mg/200 mL premix     1,000 mg 200 mL/hr over 60 Minutes Intravenous  Once 06/01/14 1012 06/01/14 1143      Assessment/Plan: s/p Procedure(s): IRRIGATION AND DEBRIDEMENT ABSCESS  (Right) Advance diet Continue abx BID dressing change. Once she can tolerate without IV pain meds then she can go home  LOS: 2 days    TOTH III,PAUL S 06/03/2014

## 2014-06-04 LAB — GLUCOSE, CAPILLARY: Glucose-Capillary: 368 mg/dL — ABNORMAL HIGH (ref 70–99)

## 2014-06-04 MED ORDER — DOXYCYCLINE HYCLATE 100 MG PO CAPS: 100.0000 mg | ORAL_CAPSULE | Freq: Two times a day (BID) | ORAL | Status: DC

## 2014-06-04 MED ORDER — SACCHAROMYCES BOULARDII 250 MG PO CAPS: 250.0000 mg | ORAL_CAPSULE | Freq: Two times a day (BID) | ORAL | Status: DC

## 2014-06-04 MED ORDER — CIPROFLOXACIN HCL 500 MG PO TABS
500.0000 mg | ORAL_TABLET | Freq: Two times a day (BID) | ORAL | Status: DC
Start: 2014-06-04 — End: 2014-08-04

## 2014-06-04 MED ORDER — DOXYCYCLINE HYCLATE 100 MG PO TABS: 100.0000 mg | ORAL_TABLET | Freq: Two times a day (BID) | ORAL | Status: DC

## 2014-06-04 NOTE — Progress Notes (Signed)
2 Days Post-Op  Subjective: No complaints. Was able to change dressing yesterday  Objective: Vital signs in last 24 hours: Temp:  [97.9 F (36.6 C)-98.2 F (36.8 C)] 98 F (36.7 C) (09/13 0546) Pulse Rate:  [71-85] 71 (09/13 0546) Resp:  [16-18] 16 (09/13 0546) BP: (100-110)/(60-69) 110/69 mmHg (09/13 0546) SpO2:  [96 %-98 %] 96 % (09/13 0546) Last BM Date: 06/01/14  Intake/Output from previous day: 09/12 0701 - 09/13 0700 In: 1400 [P.O.:1400] Out: 9 [Urine:9] Intake/Output this shift:    Resp: clear to auscultation bilaterally Cardio: regular rate and rhythm GI: soft, non-tender; bowel sounds normal; no masses,  no organomegaly Extremities: wound clean  Lab Results:   Recent Labs  06/01/14 1503 06/02/14 0500  WBC 8.2 6.1  HGB 13.4 12.1  HCT 39.3 36.4  PLT 296 271   BMET  Recent Labs  06/01/14 1503 06/02/14 0500  NA  --  142  K  --  4.0  CL  --  106  CO2  --  24  GLUCOSE  --  149*  BUN  --  11  CREATININE 0.65 0.65  CALCIUM  --  8.2*   PT/INR No results found for this basename: LABPROT, INR,  in the last 72 hours ABG No results found for this basename: PHART, PCO2, PO2, HCO3,  in the last 72 hours  Studies/Results: No results found.  Anti-infectives: Anti-infectives   Start     Dose/Rate Route Frequency Ordered Stop   06/04/14 0000  doxycycline (VIBRAMYCIN) 100 MG capsule     100 mg Oral 2 times daily 06/04/14 0847     06/04/14 0000  ciprofloxacin (CIPRO) 500 MG tablet     500 mg Oral 2 times daily 06/04/14 0847     06/02/14 0300  vancomycin (VANCOCIN) IVPB 1000 mg/200 mL premix     1,000 mg 200 mL/hr over 60 Minutes Intravenous Every 12 hours 06/01/14 1403     06/01/14 1500  vancomycin (VANCOCIN) 1,500 mg in sodium chloride 0.9 % 500 mL IVPB     1,500 mg 250 mL/hr over 120 Minutes Intravenous  Once 06/01/14 1403 06/01/14 1715   06/01/14 1015  vancomycin (VANCOCIN) IVPB 1000 mg/200 mL premix     1,000 mg 200 mL/hr over 60 Minutes  Intravenous  Once 06/01/14 1012 06/01/14 1143      Assessment/Plan: s/p Procedure(s): IRRIGATION AND DEBRIDEMENT ABSCESS (Right) Discharge  LOS: 3 days    TOTH III,PAUL S 06/04/2014

## 2014-06-04 NOTE — Progress Notes (Signed)
Patient ID: [name]  female  [id number]    DOB: [birth year]    DOA: 06/01/2014  PCP: Vidal Schwalbe, MD  Assessment/Plan: Principal Problem:   Cellulitis of right thigh with history of Hidradenitis suppurativa: Postop day 2 - Patient was transitioned to oral doxycycline. General Surgery followed the patient closely, she underwent I&D in OR - Continue pain control  Active Problems:   Esophageal reflux - Continue PPI   Tobacco abuse - Patient counseled on smoking cessation    DKA, type 1uncontrolled  -  hemoglobin A1c 10.2, continue sliding scale insulin - Continue Lantus  DVT Prophylaxis: Lovenox   Code Status: full code   Family Communication:  Disposition: Patient okay to DC home, Dr. Marlou Starks has already done DC summary for patient.  Consultants:   general surgery   Procedures:   I&D   Antibiotics:   IV vancomycin     Subjective: Patient seen and examined earlier this morning, no complaints, feels she is ready to go home  Objective: Weight change:   Intake/Output Summary (Last 24 hours) at 06/04/14 1212 Last data filed at 06/04/14 0800  Gross per 24 hour  Intake   1160 ml  Output     10 ml  Net   1150 ml   Blood pressure 110/69, pulse 71, temperature 98 F (36.7 C), temperature source Oral, resp. rate 16, height 5\' 5"  (1.651 m), weight 100.245 kg (221 lb), last menstrual period 05/21/2014, SpO2 96.00%.  Physical Exam: General: Alert and awake, oriented x3, not in any acute distress. CVS: S1-S2 clear, no murmur rubs or gallops Chest: clear to auscultation bilaterally, no wheezing, rales or rhonchi Abdomen: soft nontender, nondistended, normal bowel sounds  Extremities: No sinusitis clubbing or edema    Lab Results: Basic Metabolic Panel:  Recent Labs Lab 06/01/14 0840 06/01/14 1503 06/02/14 0500  NA 137  --  142  K 4.6  --  4.0  CL 103  --  106  CO2 22  --  24  GLUCOSE 150*  --  149*  BUN 11  --  11  CREATININE 0.57 0.65 0.65   CALCIUM 8.8  --  8.2*   Liver Function Tests: No results found for this basename: AST, ALT, ALKPHOS, BILITOT, PROT, ALBUMIN,  in the last 168 hours No results found for this basename: LIPASE, AMYLASE,  in the last 168 hours No results found for this basename: AMMONIA,  in the last 168 hours CBC:  Recent Labs Lab 06/01/14 0840 06/01/14 1503 06/02/14 0500  WBC 8.0 8.2 6.1  NEUTROABS 4.3  --   --   HGB 14.0 13.4 12.1  HCT 40.2 39.3 36.4  MCV 91.0 93.3 92.2  PLT 295 296 271   Cardiac Enzymes: No results found for this basename: CKTOTAL, CKMB, CKMBINDEX, TROPONINI,  in the last 168 hours BNP: No components found with this basename: POCBNP,  CBG:  Recent Labs Lab 06/03/14 0746 06/03/14 1140 06/03/14 1701 06/03/14 2138 06/04/14 0800  GLUCAP 359* 254* 141* 349* 368*     Micro Results: Recent Results (from the past 240 hour(s))  SURGICAL PCR SCREEN     Status: None   Collection Time    06/01/14  4:15 PM      Result Value Ref Range Status   MRSA, PCR NEGATIVE  NEGATIVE Final   Staphylococcus aureus NEGATIVE  NEGATIVE Final   Comment:            The Xpert SA Assay (FDA  approved for NASAL specimens     in patients over 52 years of age),     is one component of     a comprehensive surveillance     program.  Test performance has     been validated by [employer] for patients greater     than or equal to 98 year old.     It is not intended     to diagnose infection nor to     guide or monitor treatment.  CULTURE, ROUTINE-ABSCESS     Status: None   Collection Time    06/02/14  9:21 AM      Result Value Ref Range Status   Specimen Description ABSCESS THIGH RIGHT   Final   Special Requests INNER THIGH   Final   Gram Stain     Final   Value: FEW WBC PRESENT, PREDOMINANTLY MONONUCLEAR     NO SQUAMOUS EPITHELIAL CELLS SEEN     NO ORGANISMS SEEN     Performed at [facility]   Culture     Final   Value: NO GROWTH 2 DAYS     Performed at [facility]   Report Status PENDING   Incomplete  ANAEROBIC CULTURE     Status: None   Collection Time    06/02/14  9:21 AM      Result Value Ref Range Status   Specimen Description ABSCESS THIGH RIGHT   Final   Special Requests INNER THIGH   Final   Gram Stain     Final   Value: FEW WBC PRESENT, PREDOMINANTLY MONONUCLEAR     NO SQUAMOUS EPITHELIAL CELLS SEEN     NO ORGANISMS SEEN     Performed at [facility]   Culture     Final   Value: NO ANAEROBES ISOLATED; CULTURE IN PROGRESS FOR 5 DAYS     Performed at [facility]   Report Status PENDING   Incomplete    Studies/Results: No results found.  Medications: Scheduled Meds: . docusate sodium  100 mg Oral BID  . enoxaparin (LOVENOX) injection  40 mg Subcutaneous Q24H  . insulin aspart  0-20 Units Subcutaneous TID WC  . insulin aspart  4 Units Subcutaneous TID WC  . insulin glargine  50 Units Subcutaneous q morning - 10a  . loratadine  10 mg Oral Daily  . polyethylene glycol  17 g Oral Daily  . saccharomyces boulardii  250 mg Oral BID  . sodium chloride  3 mL Intravenous Q12H  . vancomycin  1,000 mg Intravenous Q12H      LOS: 3 days   Cyle Kenyon M.D. [facility] 06/04/2014, 12:12 PM Pager: [phone]  If 7PM-7AM, please contact night-coverage [email or web address] Password [email or web address]  **Disclaimer: This note was dictated with voice recognition software. Similar sounding words can inadvertently be transcribed and this note may contain transcription errors which may not have been corrected upon publication of note.**

## 2014-06-04 NOTE — Discharge Summary (Signed)
Physician Discharge Summary  Patient ID: [name] MRN: [id number] DOB/AGE: [birth year] 33 y.o.  Admit date: 06/01/2014 Discharge date: 06/04/2014  Admission Diagnoses:  Discharge Diagnoses:  Principal Problem:   Cellulitis of right thigh Active Problems:   Esophageal reflux   Hidradenitis suppurativa   DKA, type 1   Discharged Condition: good  Hospital Course: the pt underwent incision and drainage of abscess on right thigh. She had some pain issues early postop but she is now able to tolerate her dressing changes and she is ready for discharge home  Consults: None  Significant Diagnostic Studies: as above  Treatments: surgery: as above  Discharge Exam: Blood pressure 110/69, pulse 71, temperature 98 F (36.7 C), temperature source Oral, resp. rate 16, height 5\' 5"  (1.651 m), weight 221 lb (100.245 kg), last menstrual period 05/21/2014, SpO2 96.00%. Extremities: wound clean  Disposition: 01-Home or Self Care  Discharge Instructions   Call MD for:  difficulty breathing, headache or visual disturbances    Complete by:  As directed      Call MD for:  extreme fatigue    Complete by:  As directed      Call MD for:  hives    Complete by:  As directed      Call MD for:  persistant dizziness or light-headedness    Complete by:  As directed      Call MD for:  persistant nausea and vomiting    Complete by:  As directed      Call MD for:  redness, tenderness, or signs of infection (pain, swelling, redness, odor or green/yellow discharge around incision site)    Complete by:  As directed      Call MD for:  severe uncontrolled pain    Complete by:  As directed      Call MD for:  temperature >100.4    Complete by:  As directed      Diet - low sodium heart healthy    Complete by:  As directed      Diet Carb Modified    Complete by:  As directed      Discharge instructions    Complete by:  As directed   Wound care: dressing change twice a day     Discharge instructions     Complete by:  As directed   Shower 1-2 times a day and repack gauze into wound.     Increase activity slowly    Complete by:  As directed      Increase activity slowly    Complete by:  As directed             Medication List    STOP taking these medications       clindamycin 150 MG capsule  Commonly known as:  CLEOCIN      TAKE these medications       ciprofloxacin 500 MG tablet  Commonly known as:  CIPRO  Take 1 tablet (500 mg total) by mouth 2 (two) times daily. X 2 weeks     clindamycin 1 % gel  Commonly known as:  CLINDAGEL  Apply 1 application topically 2 (two) times daily as needed (boil / rash).     doxycycline 100 MG capsule  Commonly known as:  VIBRAMYCIN  Take 1 capsule (100 mg total) by mouth 2 (two) times daily. X 2 weeks     doxycycline 100 MG tablet  Commonly known as:  VIBRA-TABS  Take 1 tablet (100 mg total)  by mouth 2 (two) times daily.     fexofenadine 180 MG tablet  Commonly known as:  ALLEGRA  Take 180 mg by mouth daily as needed.     HYDROcodone-acetaminophen 5-325 MG per tablet  Commonly known as:  NORCO/VICODIN  Take 1-2 tablets by mouth every 4 (four) hours as needed for moderate pain.     insulin aspart 100 UNIT/ML injection  Commonly known as:  novoLOG  Inject 1-10 Units into the skin 3 (three) times daily before meals.     insulin glargine 100 UNIT/ML injection  Commonly known as:  LANTUS  Inject 50 Units into the skin every morning.     ondansetron 8 MG disintegrating tablet  Commonly known as:  ZOFRAN ODT  Take 1 tablet (8 mg total) by mouth every 8 (eight) hours as needed for nausea or vomiting.     saccharomyces boulardii 250 MG capsule  Commonly known as:  FLORASTOR  Take 1 capsule (250 mg total) by mouth 2 (two) times daily.           Follow-up Information   Follow up with [name], MD On 06/07/2014. (for hospital follow-up, please keep your appt)    Specialty:  General Surgery   Contact information:    [address] [place] [address] [phone]       Signed: Merrie Roof 06/04/2014, 9:20 AM

## 2014-06-05 ENCOUNTER — Encounter (HOSPITAL_COMMUNITY): Payer: Self-pay | Admitting: Surgery

## 2014-06-05 LAB — CULTURE, ROUTINE-ABSCESS: Culture: NO GROWTH

## 2014-06-07 LAB — ANAEROBIC CULTURE

## 2014-06-07 NOTE — ED Provider Notes (Signed)
Medical screening examination/treatment/procedure(s) were performed by non-physician practitioner and as supervising physician I was immediately available for consultation/collaboration.   EKG Interpretation None       Virgel Manifold, MD 06/07/14 1213

## 2014-06-28 ENCOUNTER — Other Ambulatory Visit: Payer: Self-pay | Admitting: Internal Medicine

## 2014-06-28 ENCOUNTER — Emergency Department (HOSPITAL_COMMUNITY): Payer: 59

## 2014-06-28 ENCOUNTER — Encounter (HOSPITAL_COMMUNITY): Payer: Self-pay | Admitting: Emergency Medicine

## 2014-06-28 ENCOUNTER — Emergency Department (HOSPITAL_COMMUNITY)
Admission: EM | Admit: 2014-06-28 | Discharge: 2014-06-28 | Disposition: A | Discharge status: Home / Self Care | Payer: 59 | Attending: Emergency Medicine | Admitting: Emergency Medicine

## 2014-06-28 ENCOUNTER — Other Ambulatory Visit: Payer: Self-pay

## 2014-06-28 DIAGNOSIS — Z8669 Personal history of other diseases of the nervous system and sense organs: Secondary | ICD-10-CM | POA: Insufficient documentation

## 2014-06-28 DIAGNOSIS — Z794 Long term (current) use of insulin: Secondary | ICD-10-CM | POA: Insufficient documentation

## 2014-06-28 DIAGNOSIS — R002 Palpitations: Secondary | ICD-10-CM | POA: Insufficient documentation

## 2014-06-28 DIAGNOSIS — R079 Chest pain, unspecified: Secondary | ICD-10-CM | POA: Diagnosis present

## 2014-06-28 DIAGNOSIS — R5383 Other fatigue: Secondary | ICD-10-CM | POA: Diagnosis not present

## 2014-06-28 DIAGNOSIS — E1065 Type 1 diabetes mellitus with hyperglycemia: Secondary | ICD-10-CM

## 2014-06-28 DIAGNOSIS — Z8719 Personal history of other diseases of the digestive system: Secondary | ICD-10-CM | POA: Diagnosis not present

## 2014-06-28 DIAGNOSIS — Z8742 Personal history of other diseases of the female genital tract: Secondary | ICD-10-CM | POA: Diagnosis not present

## 2014-06-28 DIAGNOSIS — Z8614 Personal history of Methicillin resistant Staphylococcus aureus infection: Secondary | ICD-10-CM | POA: Diagnosis not present

## 2014-06-28 DIAGNOSIS — Z8659 Personal history of other mental and behavioral disorders: Secondary | ICD-10-CM | POA: Diagnosis not present

## 2014-06-28 DIAGNOSIS — R0789 Other chest pain: Secondary | ICD-10-CM | POA: Insufficient documentation

## 2014-06-28 DIAGNOSIS — R51 Headache: Secondary | ICD-10-CM | POA: Diagnosis not present

## 2014-06-28 DIAGNOSIS — Z872 Personal history of diseases of the skin and subcutaneous tissue: Secondary | ICD-10-CM | POA: Diagnosis not present

## 2014-06-28 DIAGNOSIS — Z792 Long term (current) use of antibiotics: Secondary | ICD-10-CM | POA: Insufficient documentation

## 2014-06-28 DIAGNOSIS — R42 Dizziness and giddiness: Secondary | ICD-10-CM | POA: Diagnosis not present

## 2014-06-28 DIAGNOSIS — R011 Cardiac murmur, unspecified: Secondary | ICD-10-CM | POA: Insufficient documentation

## 2014-06-28 DIAGNOSIS — Z91040 Latex allergy status: Secondary | ICD-10-CM | POA: Diagnosis not present

## 2014-06-28 DIAGNOSIS — Z85828 Personal history of other malignant neoplasm of skin: Secondary | ICD-10-CM | POA: Insufficient documentation

## 2014-06-28 DIAGNOSIS — Z8701 Personal history of pneumonia (recurrent): Secondary | ICD-10-CM | POA: Insufficient documentation

## 2014-06-28 DIAGNOSIS — Z880 Allergy status to penicillin: Secondary | ICD-10-CM | POA: Insufficient documentation

## 2014-06-28 DIAGNOSIS — IMO0002 Reserved for concepts with insufficient information to code with codable children: Secondary | ICD-10-CM

## 2014-06-28 DIAGNOSIS — J45901 Unspecified asthma with (acute) exacerbation: Secondary | ICD-10-CM | POA: Insufficient documentation

## 2014-06-28 DIAGNOSIS — Z720 Tobacco use: Secondary | ICD-10-CM | POA: Insufficient documentation

## 2014-06-28 DIAGNOSIS — R110 Nausea: Secondary | ICD-10-CM | POA: Insufficient documentation

## 2014-06-28 DIAGNOSIS — Z79899 Other long term (current) drug therapy: Secondary | ICD-10-CM | POA: Insufficient documentation

## 2014-06-28 DIAGNOSIS — I471 Supraventricular tachycardia: Secondary | ICD-10-CM

## 2014-06-28 DIAGNOSIS — E109 Type 1 diabetes mellitus without complications: Secondary | ICD-10-CM | POA: Diagnosis not present

## 2014-06-28 DIAGNOSIS — J40 Bronchitis, not specified as acute or chronic: Secondary | ICD-10-CM

## 2014-06-28 LAB — CBC
HEMATOCRIT: 42.5 % (ref 36.0–46.0)
Hemoglobin: 14.8 g/dL (ref 12.0–15.0)
MCH: 31.7 pg (ref 26.0–34.0)
MCHC: 34.8 g/dL (ref 30.0–36.0)
MCV: 91 fL (ref 78.0–100.0)
Platelets: 284 10*3/uL (ref 150–400)
RBC: 4.67 MIL/uL (ref 3.87–5.11)
RDW: 12.3 % (ref 11.5–15.5)
WBC: 8.8 10*3/uL (ref 4.0–10.5)

## 2014-06-28 LAB — I-STAT VENOUS BLOOD GAS, ED
Acid-base deficit: 4 mmol/L — ABNORMAL HIGH (ref 0.0–2.0)
Bicarbonate: 21.8 mEq/L (ref 20.0–24.0)
O2 Saturation: 51 %
PH VEN: 7.343 — AB (ref 7.250–7.300)
TCO2: 23 mmol/L (ref 0–100)
pCO2, Ven: 40.2 mmHg — ABNORMAL LOW (ref 45.0–50.0)
pO2, Ven: 29 mmHg — CL (ref 30.0–45.0)

## 2014-06-28 LAB — BASIC METABOLIC PANEL
ANION GAP: 18 — AB (ref 5–15)
BUN: 15 mg/dL (ref 6–23)
CALCIUM: 9.3 mg/dL (ref 8.4–10.5)
CHLORIDE: 98 meq/L (ref 96–112)
CO2: 17 mEq/L — ABNORMAL LOW (ref 19–32)
CREATININE: 0.59 mg/dL (ref 0.50–1.10)
GFR calc Af Amer: >90 mL/min (ref >90)
Glucose, Bld: 252 mg/dL — ABNORMAL HIGH (ref 70–99)
POTASSIUM: 4.1 meq/L (ref 3.7–5.3)
Sodium: 133 mEq/L — ABNORMAL LOW (ref 137–147)

## 2014-06-28 LAB — I-STAT TROPONIN, ED: Troponin i, poc: 0 ng/mL (ref 0.00–0.08)

## 2014-06-28 LAB — D-DIMER, QUANTITATIVE (NOT AT ARMC): D-Dimer, Quant: 0.27 ug/mL-FEU (ref 0.00–0.48)

## 2014-06-28 LAB — TSH: TSH: 0.775 u[IU]/mL (ref 0.350–4.500)

## 2014-06-28 LAB — PRO B NATRIURETIC PEPTIDE: PRO B NATRI PEPTIDE: 68.4 pg/mL (ref 0–125)

## 2014-06-28 LAB — CBG MONITORING, ED: GLUCOSE-CAPILLARY: 165 mg/dL — AB (ref 70–99)

## 2014-06-28 MED ORDER — ONDANSETRON 4 MG PO TBDP
8.0000 mg | ORAL_TABLET | Freq: Once | ORAL | Status: AC
  Administered 2014-06-28: 8 mg via ORAL
  Filled 2014-06-28: qty 2

## 2014-06-28 MED ORDER — NAPROXEN 500 MG PO TABS: 500.0000 mg | ORAL_TABLET | Freq: Two times a day (BID) | ORAL | Status: DC | PRN

## 2014-06-28 MED ORDER — SODIUM CHLORIDE 0.9 % IV BOLUS (SEPSIS)
1000.0000 mL | Freq: Once | INTRAVENOUS | Status: AC
  Administered 2014-06-28: 1000 mL via INTRAVENOUS

## 2014-06-28 NOTE — ED Notes (Signed)
Pt is difficult stick, will call phelbotomy

## 2014-06-28 NOTE — ED Notes (Signed)
Pt reports having left side sharp chest pains that started last night. Also has pain in left side of neck, shoulder and arm, pain increases with movement. Having productive cough with green sputum and sob with mild exertion. ekg done at triage.

## 2014-06-28 NOTE — ED Provider Notes (Signed)
CSN: [id number]     Arrival date & time 06/28/14  1010 History   First MD Initiated Contact with Patient 06/28/14 1137     Chief Complaint  Patient presents with  . Chest Pain     (Consider location/radiation/quality/duration/timing/severity/associated sxs/prior Treatment) HPI Comments: [name] is a 33 y.o. Female with a PMHx of childhood asthma (no longer on inhalers), IDDM (takes Lantus 50U qAM + SSI Novolog with meals), PCOS, hydradenitis, multiple abscesses/cellulitis (recently admitted for management of this on 06/01/14), GERD, vitamin D deficiency, and migraines, who presents to the ED with multiple complaints but primarily complains of CP x1 day. Pt states that she was exposed to mold on Saturday, and began developing a productive cough with greenish sputum on Sunday, for which she took left-over doxycycline which has improved it over the course of the last few days. By Monday she developed some "laryngitis" symptoms and on Tuesday she used xopenex for some mild SOB she was feeling with some relief but did not continue using it. Over the course of yesterday she developed some intermittent 8/10 squeezing L sided CP that radiates into her L jaw and neck as well as into her L arm, coming sporadically at rest with no known triggers or aggravating/alleviating factors. Denies that it changes with movement or respiration, and does not know what triggers it. Associated with her symptoms she has had SOB/DOE/fatigue, feels "wiped out", and had some palpitations intermittently, with a HR as high as 120s-150s, worsening with exertion and improving with rest. States she's felt lightheaded intermittently, and had some mildly low blood pressures (checked it at work, works with an Horticulturist, commercial) and gets lightheaded intermittently without known aggravating or eliciting factors. Also states she's had to use 3 pillows at night recently, when she usually uses 2 pillows. Endorses some LE swelling x2 days, worse with  standing for long periods, and relieved by elevation, L>R with some L calf soreness. She also had some mild nausea intermittently since last night as well as intermittent HA's similar to her chronic headaches, has not taken anything specific for these symptoms. Denies that the symptoms occur all at once, they've just gradually developed over the course of the last few days. Denies fevers, chills, eye/ear complaints, rhinorrhea, sore throat, chest/nasal congestion, wheezing, dysphagia, heartburn, abd pain, vomiting, hematochezia/melena, diarrhea/constipation, PND, claudication, syncope, dizziness, vertigo, anxiety, back pain, diaphoresis, numbness, weakness, LE swelling, rashes, vision changes, or neuro deficits. Denies hx of DVT/PE, but has +FHx of DVT. Denies hx of hyperthyroidism or thyroid disease, no hx of afib although states she had tachycardia in the distant past for which she was on propranolol but no longer takes this and has not had issues such as these in a long time. No recent travel or immobility, no recent surgeries. No sick contacts. Smokes daily. Checks CBG frequently and is compliant with all meds. No hormonal replacement meds, has mirena IUD.   Patient is a 33 y.o. female presenting with chest pain, shortness of breath, and palpitations. The history is provided by the patient. No language interpreter was used.  Chest Pain Pain location:  L lateral chest Pain quality comment:  Squeezing Pain radiates to:  L jaw, L shoulder and L arm Pain radiates to the back: no   Pain severity:  Moderate (8/10) Onset quality:  Gradual Duration:  1 day Timing:  Sporadic Progression:  Unchanged (not currently ongoing) Chronicity:  New Context: at rest (sporadic)   Relieved by:  None tried Worsened by:  Nothing  tried Ineffective treatments:  None tried Associated symptoms: cough (improving over last 3 days), fatigue, headache, lower extremity edema (after full day of work, L>R, resolves with  elevation), nausea, orthopnea (3 pillows vs usual 1-2 pillows), palpitations (intermittently) and shortness of breath   Associated symptoms: no abdominal pain, no anxiety, no back pain, no claudication, no diaphoresis, no dizziness, no dysphagia, no fever, no heartburn, no near-syncope, no numbness, no PND, no syncope, not vomiting and no weakness   Cough:    Cough characteristics:  Productive   Sputum characteristics:  Green   Severity:  Mild   Onset quality:  Gradual   Duration:  3 days   Timing:  Intermittent   Progression:  Partially resolved   Chronicity:  New Nausea:    Severity:  Mild   Onset quality:  Gradual   Duration:  1 day   Timing:  Intermittent Risk factors: diabetes mellitus and smoking   Risk factors: no birth control (Mirena), no immobilization, no prior DVT/PE and no surgery   Shortness of Breath Severity:  Mild Onset quality:  Gradual Duration:  1 day Timing:  Intermittent Progression:  Unchanged Chronicity:  New Context: activity   Relieved by:  Rest Worsened by:  Exertion Ineffective treatments:  Inhaler (tried xopenex at her workplace, hx of asthma as a child but none as adult) Associated symptoms: chest pain, cough (improving over last 3 days), headaches, neck pain and sputum production (greenish)   Associated symptoms: no abdominal pain, no claudication, no diaphoresis, no ear pain, no fever, no hemoptysis, no PND, no rash, no sore throat, no syncope, no swollen glands, no vomiting and no wheezing   Risk factors: family hx of DVT and tobacco use   Risk factors: no hx of PE/DVT, no oral contraceptive use, no prolonged immobilization and no recent surgery   Palpitations Palpitations quality:  Fast Onset quality:  At rest Timing:  Intermittent Progression:  Partially resolved Chronicity:  Recurrent (had elevated HR in past, was on propranolol previously but no longer taking this) Relieved by:  None tried Ineffective treatments:  None tried Associated  symptoms: chest pain, cough (improving over last 3 days), leg pain (L calf), lower extremity edema (after full day of work, L>R, resolves with elevation), malaise/fatigue, nausea, orthopnea (3 pillows vs usual 1-2 pillows) and shortness of breath   Associated symptoms: no back pain, no chest pressure, no diaphoresis, no dizziness, no hemoptysis, no near-syncope, no numbness, no PND, no syncope and no vomiting   Risk factors: diabetes mellitus   Risk factors: no hx of atrial fibrillation, no hx of DVT, no hx of PE, no hx of thyroid disease and no hyperthyroidism     Past Medical History  Diagnosis Date  . Hx MRSA infection   . Migraine   . Vitamin D insufficiency   . Asthma     as a child  . GERD (gastroesophageal reflux disease)     no current meds.  . Hidradenitis 04/2012    bilat. thighs, left groin - open areas on thighs  . Diabetes mellitus     IDDM, Insulin pump; followed by Dr. [name]  . Abscess of left axilla - PREVOTELLA BIVIA & Staph Coag Neg 07/12/2012    MODERATE PREVOTELLA BIVIA Note: BETA LACTAMASE NEGATIVE    . PCOS (polycystic ovarian syndrome)   . Heart murmur   . Pneumonia     hx  . Depression     hx  . Cancer     skin  tag rt breast  . Hydradenitis   . Cellulitis 06/01/2014    RT INNER THIGH   Past Surgical History  Procedure Laterality Date  . Wisdom tooth extraction    . Tonsillectomy    . Bunionectomy      left  . Esophagogastroduodenoscopy  11/17/2011    Procedure: ESOPHAGOGASTRODUODENOSCOPY (EGD);  Surgeon: [name], MD;  Location: [facility];  Service: Endoscopy;  Laterality: N/A;  . Eye surgery      exc. stye left eye  . Breast surgery      right lumpectomy  . Cholecystectomy  12/02/2006    lap. chole.  . Dilation and evacuation  02/14/2007  . Hydradenitis excision  04/30/2012    Procedure: EXCISION HYDRADENITIS GROIN;  Surgeon: [name], MD;  Location: [place];  Service: General;  Laterality: Left;   wide excision hidradenitis bilateral thighs and Left groin  . Hydradenitis excision Left 01/13/2014    Procedure: WIDE EXCISION HIDRADENITIS LEFT AXILLA;  Surgeon: [name], MD;  Location: [place];  Service: General;  Laterality: Left;  . Irrigation and debridement abscess Right 06/02/2014    Procedure: IRRIGATION AND DEBRIDEMENT ABSCESS;  Surgeon: [name], MD;  Location: [place];  Service: General;  Laterality: Right;   History reviewed. No pertinent family history. History  Substance Use Topics  . Smoking status: Current Some Day Smoker -- 0.75 packs/day for 14 years    Types: Cigarettes  . Smokeless tobacco: Never Used     Comment: 5 cig./day  . Alcohol Use: No   OB History   Grav Para Term Preterm Abortions TAB SAB Ect Mult Living   4    3  3   1      Review of Systems  Constitutional: Positive for malaise/fatigue and fatigue. Negative for fever, chills and diaphoresis.  HENT: Negative for congestion, ear discharge, ear pain, postnasal drip, rhinorrhea, sinus pressure, sore throat and trouble swallowing.   Eyes: Negative for photophobia, pain, discharge, itching and visual disturbance.  Respiratory: Positive for cough (improving over last 3 days), sputum production (greenish) and shortness of breath. Negative for hemoptysis, chest tightness and wheezing.   Cardiovascular: Positive for chest pain, palpitations (intermittently) and orthopnea (3 pillows vs usual 1-2 pillows). Negative for claudication, syncope, PND and near-syncope.  Gastrointestinal: Positive for nausea. Negative for heartburn, vomiting, abdominal pain, diarrhea, constipation and blood in stool.  Endocrine: Negative for polydipsia, polyphagia and polyuria.  Genitourinary: Negative for dysuria, hematuria and decreased urine volume.  Musculoskeletal: Positive for neck pain. Negative for arthralgias, back pain, joint swelling and myalgias.  Skin: Negative for rash.  Neurological: Positive for  light-headedness and headaches. Negative for dizziness, syncope, weakness and numbness.  Hematological: Negative for adenopathy.  Psychiatric/Behavioral: Negative for confusion.   10 Systems reviewed and are negative for acute change except as noted in the HPI.    Allergies  Latex; Levofloxacin; Moxifloxacin; Oxycodone-acetaminophen; Peach; Penicillins; Propoxyphene n-acetaminophen; Rosiglitazone maleate; Xolair; Adhesive; Potassium-containing compounds; Prednisone; Morphine and related; Cefaclor; Keflex; Promethazine hcl; and Sulfadiazine  Home Medications   Prior to Admission medications   Medication Sig Start Date End Date Taking? Authorizing Provider  ciprofloxacin (CIPRO) 500 MG tablet Take 1 tablet (500 mg total) by mouth 2 (two) times daily. X 2 weeks 06/04/14   Ripudeep Krystal Eaton, MD  clindamycin (CLINDAGEL) 1 % gel Apply 1 application topically 2 (two) times daily as needed (boil / rash).     Historical Provider, MD  doxycycline (VIBRA-TABS) 100 MG  tablet Take 1 tablet (100 mg total) by mouth 2 (two) times daily. 06/04/14   Autumn Messing III, MD  doxycycline (VIBRAMYCIN) 100 MG capsule Take 1 capsule (100 mg total) by mouth 2 (two) times daily. X 2 weeks 06/04/14   Ripudeep Krystal Eaton, MD  fexofenadine (ALLEGRA) 180 MG tablet Take 180 mg by mouth daily as needed. 05/30/14   Rigoberto Noel, MD  HYDROcodone-acetaminophen (NORCO/VICODIN) 5-325 MG per tablet Take 1-2 tablets by mouth every 4 (four) hours as needed for moderate pain.    Historical Provider, MD  insulin aspart (NOVOLOG) 100 UNIT/ML injection Inject 1-10 Units into the skin 3 (three) times daily before meals.     Historical Provider, MD  insulin glargine (LANTUS) 100 UNIT/ML injection Inject 50 Units into the skin every morning.  05/10/13   Charlynne Cousins, MD  ondansetron (ZOFRAN ODT) 8 MG disintegrating tablet Take 1 tablet (8 mg total) by mouth every 8 (eight) hours as needed for nausea or vomiting. 05/25/14   Tatyana A Kirichenko, PA-C   saccharomyces boulardii (FLORASTOR) 250 MG capsule Take 1 capsule (250 mg total) by mouth 2 (two) times daily. 06/04/14   Ripudeep Krystal Eaton, MD   BP 123/81  Pulse 100  Temp(Src) 97.8 F (36.6 C) (Oral)  Resp 18  SpO2 100%  LMP 06/19/2014 Physical Exam  Nursing note and vitals reviewed. Constitutional: She is oriented to person, place, and time. Vital signs are normal. She appears well-developed and well-nourished.  Non-toxic appearance. No distress.  Afebrile, nontoxic, NAD, resting in bed comfortably, HR 80-90s.   HENT:  Head: Normocephalic and atraumatic.  Right Ear: Hearing, tympanic membrane, external ear and ear canal normal.  Left Ear: Hearing, tympanic membrane, external ear and ear canal normal.  Nose: Nose normal.  Mouth/Throat: Oropharynx is clear and moist. Mucous membranes are dry. No trismus in the jaw. No uvula swelling.  Ears and nose clear bilaterally, oropharynx clear with mildly dry mucous membranes  Eyes: Conjunctivae and EOM are normal. Pupils are equal, round, and reactive to light. Right eye exhibits no discharge. Left eye exhibits no discharge.  Neck: Normal range of motion. Neck supple. No JVD present.  FROM intact without spinous process or paraspinous muscle TTP, no bony stepoffs or deformities, no muscle spasms. No rigidity or meningeal signs. No bruising or swelling. No JVD  Cardiovascular: Normal rate, regular rhythm, normal heart sounds and intact distal pulses.  Exam reveals no gallop and no friction rub.   No murmur heard. HR 80s-90s during exam, RRR, nl s1/s2, no m/r/g  Pulmonary/Chest: Effort normal and breath sounds normal. No respiratory distress. She has no decreased breath sounds. She has no wheezes. She has no rhonchi. She has no rales. She exhibits no tenderness.  CTAB in all lung fields, no chest wall tenderness, no kussmaul respirations  Abdominal: Soft. Normal appearance and bowel sounds are normal. She exhibits no distension. There is no  tenderness. There is no rigidity, no rebound, no guarding and no CVA tenderness.  Soft, NTND, no r/g/r, no murphy's or mcburney's tenderness, +BS throughout  Musculoskeletal: Normal range of motion.  MAE x4  Lymphadenopathy:    She has cervical adenopathy.       Right cervical: Superficial cervical adenopathy present.       Left cervical: Superficial cervical adenopathy present.  Shotty anterior superficial cervical LAD, nontender  Neurological: She is alert and oriented to person, place, and time. She has normal strength. No sensory deficit.  Strength and sensation at  baseline  Skin: Skin is warm, dry and intact. No rash noted.  Psychiatric: She has a normal mood and affect.    ED Course  Procedures (including critical care time) Orthostatic VS @1312  Orthostatic Lying - BP- Lying: 102/66 mmHg ; Pulse- Lying: 85  Orthostatic Sitting - BP- Sitting: 111/65 mmHg ; Pulse- Sitting: 100  Orthostatic Standing at 0 minutes - BP- Standing at 0 minutes: 105/78 mmHg ; Pulse- Standing at 0 minutes: 111  Labs Review Labs Reviewed  BASIC METABOLIC PANEL - Abnormal; Notable for the following:    Sodium 133 (*)    CO2 17 (*)    Glucose, Bld 252 (*)    Anion gap 18 (*)    All other components within normal limits  I-STAT VENOUS BLOOD GAS, ED - Abnormal; Notable for the following:    pH, Ven 7.343 (*)    pCO2, Ven 40.2 (*)    pO2, Ven 29.0 (*)    Acid-base deficit 4.0 (*)    All other components within normal limits  CBG MONITORING, ED - Abnormal; Notable for the following:    Glucose-Capillary 165 (*)    All other components within normal limits  CBC  PRO B NATRIURETIC PEPTIDE  TSH  D-DIMER, QUANTITATIVE  URINALYSIS, ROUTINE W REFLEX MICROSCOPIC  I-STAT TROPOININ, ED    Imaging Review Dg Chest 2 View  06/28/2014   CLINICAL DATA:  Tachycardia. Hypotension. Shortness of breath. Chest pain.  EXAM: CHEST  2 VIEW  COMPARISON:  08/25/2013  FINDINGS: The heart size and mediastinal contours  are within normal limits. Both lungs are clear. The visualized skeletal structures are unremarkable.  IMPRESSION: No active cardiopulmonary disease.   Electronically Signed   By: Sherryl Barters M.D.   On: 06/28/2014 12:17     EKG Interpretation   Date/Time:  Wednesday June 28 2014 14:07:19 EDT Ventricular Rate:  86 PR Interval:  144 QRS Duration: 74 QT Interval:  366 QTC Calculation: 438 R Axis:   79 Text Interpretation:  Sinus rhythm Borderline T abnormalities, anterior  leads Confirmed by ZAVITZ  MD, JOSHUA ([id number]) on 06/28/2014 3:09:05 PM    EKG 06/28/14 @ 1015: sinus tachy with inverted T waves in inferior leads not present on prior EKGs, HR 103 EKG 06/28/14 @ 1407: sinus rhythm with borderline T wave flattening in anterior leads, HR 86  MDM   Final diagnoses:  Bronchitis  Chest tightness  Diabetes type 1, uncontrolled  Palpitations  Atypical chest pain    32y/o diabetic female with SOB/DOE and CP x1 day, occurring sporadically and at rest, associated with tachycardia intermittently. Given that pt is diabetic, will obtain full cardiac work up. Appears to be more related to URI, but given pt's reports of LE swelling intermittently and SOB, as well as family hx of DVT/PE, will obtain Ddimer. Discussed w/ pt that if this is possible will move forward with CTA, but hoping this is neg. Will also obtain TSH to r/o hyperthyroidism as source of intermittent tachycardia, although HR stable at 80-90s during exam. Will perform orthostatics and give fluids to see if symptoms improve. Doubt GERD, but will give zofran for her mild nausea. Pt declines pain meds at this time. Doubt need for nebs, oxygenation >98% on RA, no wheezing. Doubt RAD or need for inhaler/nebs/prednisone.  2:50 PM Orthostatic VS +. TSH neg, dimer neg, CBC WNL, CXR neg, trop neg, EKG showing borderline T wave inversion initially, repeated when HR was in NSR and had some borderline T wave  abnormals in different leads, doubt  clinical significance. BNP WNL. BMP showing mildly low sodium (corrects to normal range of 137) with gluc 252, AG 18 and bicarb 17 therefore went ahead with u/a to look for ketones and VBG. Fluids running. VBG showing pH 7.343 with normal bicarb. VSS. Will repeat CBG now after fluids and assess need for insulin.  3:48 PM Repeat CBG 165 after fluids, feels improved. U/A still not resulted but not necessary at this time given pt is not in DKA. HEART score 2-3, low risk, therefore doubt need for admission. Can f/up with cardiology outpt. Pt has not had any palpitations here, and had improvement of symptoms with fluids alone. Discussed use of naprosyn for pain as needed, and CBG monitoring, as well as hydration. At this time pt is stable for d/c, last BP 110s/90s although last documented BP is lower, but pt has had several normotensive results when pt was rechecked. I explained the diagnosis and have given explicit precautions to return to the ER including for any other new or worsening symptoms. The patient understands and accepts the medical plan as it's been dictated and I have answered their questions. Discharge instructions concerning home care and prescriptions have been given. The patient is STABLE and is discharged to home in good condition.  BP 94/55  Pulse 86  Temp(Src) 97.8 F (36.6 C) (Oral)  Resp 15  SpO2 100%  LMP 06/19/2014  Meds ordered this encounter  Medications  . sodium chloride 0.9 % bolus 1,000 mL    Sig:   . ondansetron (ZOFRAN-ODT) disintegrating tablet 8 mg    Sig:   . naproxen (NAPROSYN) 500 MG tablet    Sig: Take 1 tablet (500 mg total) by mouth 2 (two) times daily as needed for mild pain, moderate pain or headache (TAKE WITH MEALS.).    Dispense:  20 tablet    Refill:  0    Order Specific Question:  Supervising Provider    Answer:  Johnna Acosta [address] [place], PA-C 06/28/14 1551

## 2014-06-28 NOTE — ED Notes (Signed)
Patient returned from [place]. Patient placed back on the monitor.

## 2014-06-28 NOTE — Discharge Instructions (Signed)
Call the cardiologist tomorrow for follow up as an outpatient for your intermittent chest pain and palpitations. Your labs all checked out well, but you need to control your blood sugar and stay well hydrated. Get plenty of rest! Use naprosyn as needed for pain, and use mucinex for expectoration. See your regular doctor in 1 week for ongoing management of your medical conditions.    Chest Pain (Nonspecific) It is often hard to give a diagnosis for the cause of chest pain. There is always a chance that your pain could be related to something serious, such as a heart attack or a blood clot in the lungs. You need to follow up with your doctor. HOME CARE  If antibiotic medicine was given, take it as directed by your doctor. Finish the medicine even if you start to feel better.  For the next few days, avoid activities that bring on chest pain. Continue physical activities as told by your doctor.  Do not use any tobacco products. This includes cigarettes, chewing tobacco, and e-cigarettes.  Avoid drinking alcohol.  Only take medicine as told by your doctor.  Follow your doctor's suggestions for more testing if your chest pain does not go away.  Keep all doctor visits you made. GET HELP IF:  Your chest pain does not go away, even after treatment.  You have a rash with blisters on your chest.  You have a fever. GET HELP RIGHT AWAY IF:   You have more pain or pain that spreads to your arm, neck, jaw, back, or belly (abdomen).  You have shortness of breath.  You cough more than usual or cough up blood.  You have very bad back or belly pain.  You feel sick to your stomach (nauseous) or throw up (vomit).  You have very bad weakness.  You pass out (faint).  You have chills. This is an emergency. Do not wait to see if the problems will go away. Call your local emergency services (911 in [place] Do not drive yourself to the hospital. MAKE SURE YOU:   Understand these  instructions.  Will watch your condition.  Will get help right away if you are not doing well or get worse. Document Released: 02/25/2008 Document Revised: 09/13/2013 Document Reviewed: 02/25/2008 [facility] Patient Information 2015 [place], [place]. This information is not intended to replace advice given to you by your health care provider. Make sure you discuss any questions you have with your health care provider.  Blood Glucose Monitoring Monitoring your blood glucose (also know as blood sugar) helps you to manage your diabetes. It also helps you and your health care provider monitor your diabetes and determine how well your treatment plan is working. WHY SHOULD YOU MONITOR YOUR BLOOD GLUCOSE?  It can help you understand how food, exercise, and medicine affect your blood glucose.  It allows you to know what your blood glucose is at any given moment. You can quickly tell if you are having low blood glucose (hypoglycemia) or high blood glucose (hyperglycemia).  It can help you and your health care provider know how to adjust your medicines.  It can help you understand how to manage an illness or adjust medicine for exercise. WHEN SHOULD YOU TEST? Your health care provider will help you decide how often you should check your blood glucose. This may depend on the type of diabetes you have, your diabetes control, or the types of medicines you are taking. Be sure to write down all of your blood glucose readings  so that this information can be reviewed with your health care provider. See below for examples of testing times that your health care provider may suggest. Type 1 Diabetes  Test 4 times a day if you are in good control, using an insulin pump, or perform multiple daily injections.  If your diabetes is not well controlled or if you are sick, you may need to monitor more often.  It is a good idea to also monitor:  Before and after exercise.  Between meals and 2 hours after a  meal.  Occasionally between 2:00 a.m. and 3:00 a.m. Type 2 Diabetes  It can vary with each person, but generally, if you are on insulin, test 4 times a day.  If you take medicines by mouth (orally), test 2 times a day.  If you are on a controlled diet, test once a day.  If your diabetes is not well controlled or if you are sick, you may need to monitor more often. HOW TO MONITOR YOUR BLOOD GLUCOSE Supplies Needed  Blood glucose meter.  Test strips for your meter. Each meter has its own strips. You must use the strips that go with your own meter.  A pricking needle (lancet).  A device that holds the lancet (lancing device).  A journal or log book to write down your results. Procedure  Wash your hands with soap and water. Alcohol is not preferred.  Prick the side of your finger (not the tip) with the lancet.  Gently milk the finger until a small drop of blood appears.  Follow the instructions that come with your meter for inserting the test strip, applying blood to the strip, and using your blood glucose meter. Other Areas to Get Blood for Testing Some meters allow you to use other areas of your body (other than your finger) to test your blood. These areas are called alternative sites. The most common alternative sites are:  The forearm.  The thigh.  The back area of the lower leg.  The palm of the hand. The blood flow in these areas is slower. Therefore, the blood glucose values you get may be delayed, and the numbers are different from what you would get from your fingers. Do not use alternative sites if you think you are having hypoglycemia. Your reading will not be accurate. Always use a finger if you are having hypoglycemia. Also, if you cannot feel your lows (hypoglycemia unawareness), always use your fingers for your blood glucose checks. ADDITIONAL TIPS FOR GLUCOSE MONITORING  Do not reuse lancets.  Always carry your supplies with you.  All blood glucose meters  have a 24-hour "hotline" number to call if you have questions or need help.  Adjust (calibrate) your blood glucose meter with a control solution after finishing a few boxes of strips. BLOOD GLUCOSE RECORD KEEPING It is a good idea to keep a daily record or log of your blood glucose readings. Most glucose meters, if not all, keep your glucose records stored in the meter. Some meters come with the ability to download your records to your home computer. Keeping a record of your blood glucose readings is especially helpful if you are wanting to look for patterns. Make notes to go along with the blood glucose readings because you might forget what happened at that exact time. Keeping good records helps you and your health care provider to work together to achieve good diabetes management.  Document Released: 09/11/2003 Document Revised: 01/23/2014 Document Reviewed: 01/31/2013 [employer] Patient Information  2015 [employer]. This information is not intended to replace advice given to you by your health care provider. Make sure you discuss any questions you have with your health care provider.  Palpitations A palpitation is the feeling that your heartbeat is irregular. It may feel like your heart is fluttering or skipping a beat. It may also feel like your heart is beating faster than normal. This is usually not a serious problem. In some cases, you may need more medical tests. HOME CARE  Avoid:  Caffeine in coffee, tea, soft drinks, diet pills, and energy drinks.  Chocolate.  Alcohol.  Stop smoking if you smoke.  Reduce your stress and anxiety. Try:  A method that measures bodily functions so you can learn to control them (biofeedback).  Yoga.  Meditation.  Physical activity such as swimming, jogging, or walking.  Get plenty of rest and sleep. GET HELP IF:  Your fast or irregular heartbeat continues after 24 hours.  Your palpitations occur more often. GET HELP RIGHT AWAY IF:    You have chest pain.  You feel short of breath.  You have a very bad headache.  You feel dizzy or pass out (faint). MAKE SURE YOU:   Understand these instructions.  Will watch your condition.  Will get help right away if you are not doing well or get worse. Document Released: 06/17/2008 Document Revised: 01/23/2014 Document Reviewed: 11/07/2011 [facility] Patient Information 2015 [place], [place]. This information is not intended to replace advice given to you by your health care provider. Make sure you discuss any questions you have with your health care provider.  Upper Respiratory Infection, Adult An upper respiratory infection (URI) is also sometimes known as the common cold. The upper respiratory tract includes the nose, sinuses, throat, trachea, and bronchi. Bronchi are the airways leading to the lungs. Most people improve within 1 week, but symptoms can last up to 2 weeks. A residual cough may last even longer.  CAUSES Many different viruses can infect the tissues lining the upper respiratory tract. The tissues become irritated and inflamed and often become very moist. Mucus production is also common. A cold is contagious. You can easily spread the virus to others by oral contact. This includes kissing, sharing a glass, coughing, or sneezing. Touching your mouth or nose and then touching a surface, which is then touched by another person, can also spread the virus. SYMPTOMS  Symptoms typically develop 1 to 3 days after you come in contact with a cold virus. Symptoms vary from person to person. They may include:  Runny nose.  Sneezing.  Nasal congestion.  Sinus irritation.  Sore throat.  Loss of voice (laryngitis).  Cough.  Fatigue.  Muscle aches.  Loss of appetite.  Headache.  Low-grade fever. DIAGNOSIS  You might diagnose your own cold based on familiar symptoms, since most people get a cold 2 to 3 times a year. Your caregiver can confirm this based on your  exam. Most importantly, your caregiver can check that your symptoms are not due to another disease such as strep throat, sinusitis, pneumonia, asthma, or epiglottitis. Blood tests, throat tests, and X-rays are not necessary to diagnose a common cold, but they may sometimes be helpful in excluding other more serious diseases. Your caregiver will decide if any further tests are required. RISKS AND COMPLICATIONS  You may be at risk for a more severe case of the common cold if you smoke cigarettes, have chronic heart disease (such as heart failure) or lung disease (  such as asthma), or if you have a weakened immune system. The very young and very old are also at risk for more serious infections. Bacterial sinusitis, middle ear infections, and bacterial pneumonia can complicate the common cold. The common cold can worsen asthma and chronic obstructive pulmonary disease (COPD). Sometimes, these complications can require emergency medical care and may be life-threatening. PREVENTION  The best way to protect against getting a cold is to practice good hygiene. Avoid oral or hand contact with people with cold symptoms. Wash your hands often if contact occurs. There is no clear evidence that vitamin C, vitamin E, echinacea, or exercise reduces the chance of developing a cold. However, it is always recommended to get plenty of rest and practice good nutrition. TREATMENT  Treatment is directed at relieving symptoms. There is no cure. Antibiotics are not effective, because the infection is caused by a virus, not by bacteria. Treatment may include:  Increased fluid intake. Sports drinks offer valuable electrolytes, sugars, and fluids.  Breathing heated mist or steam (vaporizer or shower).  Eating chicken soup or other clear broths, and maintaining good nutrition.  Getting plenty of rest.  Using gargles or lozenges for comfort.  Controlling fevers with ibuprofen or acetaminophen as directed by your  caregiver.  Increasing usage of your inhaler if you have asthma. Zinc gel and zinc lozenges, taken in the first 24 hours of the common cold, can shorten the duration and lessen the severity of symptoms. Pain medicines may help with fever, muscle aches, and throat pain. A variety of non-prescription medicines are available to treat congestion and runny nose. Your caregiver can make recommendations and may suggest nasal or lung inhalers for other symptoms.  HOME CARE INSTRUCTIONS   Only take over-the-counter or prescription medicines for pain, discomfort, or fever as directed by your caregiver.  Use a warm mist humidifier or inhale steam from a shower to increase air moisture. This may keep secretions moist and make it easier to breathe.  Drink enough water and fluids to keep your urine clear or pale yellow.  Rest as needed.  Return to work when your temperature has returned to normal or as your caregiver advises. You may need to stay home longer to avoid infecting others. You can also use a face mask and careful hand washing to prevent spread of the virus. SEEK MEDICAL CARE IF:   After the first few days, you feel you are getting worse rather than better.  You need your caregiver's advice about medicines to control symptoms.  You develop chills, worsening shortness of breath, or brown or red sputum. These may be signs of pneumonia.  You develop yellow or brown nasal discharge or pain in the face, especially when you bend forward. These may be signs of sinusitis.  You develop a fever, swollen neck glands, pain with swallowing, or white areas in the back of your throat. These may be signs of strep throat. SEEK IMMEDIATE MEDICAL CARE IF:   You have a fever.  You develop severe or persistent headache, ear pain, sinus pain, or chest pain.  You develop wheezing, a prolonged cough, cough up blood, or have a change in your usual mucus (if you have chronic lung disease).  You develop sore  muscles or a stiff neck. Document Released: 03/04/2001 Document Revised: 12/01/2011 Document Reviewed: 12/14/2013 [facility] Patient Information 2015 [place], [place]. This information is not intended to replace advice given to you by your health care provider. Make sure you discuss any questions you  have with your health care provider. ° °

## 2014-06-28 NOTE — ED Provider Notes (Signed)
Medical screening examination/treatment/procedure(s) were conducted as a shared visit with non-physician practitioner(s) or resident and myself. I personally evaluated the patient during the encounter and agree with the findings.  I have personally reviewed any xrays and/ or EKG's with the provider and I agree with interpretation.  Patient with diabetes, anxiety, smoker, paroxysmal atrial tachycardia, DKA, migraine history presents with intermittent cough, congestion, chest tightness since the weekend. Patient denies exertional symptoms or diaphoresis. Patient denies cardiac history or blood clot history. No recent surgery or active cancer, no unilateral leg swelling. Patient has 3 risk factors for heart is low risk blood clots. D-dimer negative. Troponin delayed in ER however came back negative. Plan for close outpatient followup with primary care Dr. and cardiology as patient is low risk cardiac. Initial EKG had nonspecific changes on T waves however on repeat similar to previous. Pt improved in ED. Mild dehydrated clinically and hyperglycemia, IV fluids given.  Diabetes type 1, dehydration, upper respiratory infection, chest tightness   Mariea Clonts, MD 06/28/14 1630

## 2014-06-30 ENCOUNTER — Ambulatory Visit (INDEPENDENT_AMBULATORY_CARE_PROVIDER_SITE_OTHER): Payer: 59 | Admitting: Cardiovascular Disease

## 2014-06-30 ENCOUNTER — Encounter: Payer: Self-pay | Admitting: Cardiovascular Disease

## 2014-06-30 VITALS — BP 104/80 | HR 97 | Ht 65.0 in | Wt 222.5 lb

## 2014-06-30 DIAGNOSIS — I471 Supraventricular tachycardia: Secondary | ICD-10-CM

## 2014-06-30 DIAGNOSIS — F17200 Nicotine dependence, unspecified, uncomplicated: Secondary | ICD-10-CM

## 2014-06-30 DIAGNOSIS — R0789 Other chest pain: Secondary | ICD-10-CM

## 2014-06-30 NOTE — Assessment & Plan Note (Addendum)
She presents with episodes of tachycardia that are most likely due to sinus tachycardia. I cannot exclude an atrial tachycardia. Her glucose levels have been very high. This may be causing some degree of hypovolemia which can exacerbate sinus tachycardia. These episodes of tachycardia are associated with some chest tightness.  I've recommended that she drink more fluids - including V-8 juice.  Her BP is too low to start a beta blocker.    I would like  to do an echocardiogram for further evaluation. We'll also do a stress Myoview study to rule out ischemia. She has a long history of poorly controlled diabetes, hx of cigarette smoking, and a strong family of CAD.   I will see her in 1 month.

## 2014-06-30 NOTE — Progress Notes (Addendum)
[name] Date of Birth  14-Jun-1981       [facility] Office [address], [place], [place]  [address]   [place], [place]  [address] [place]   Fax  [phone]     Fax [phone]  Problem List: 1. Tachycardia 2. Chest pain 3. 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  This occurred after walking a short distance.    HR 155.  Later slowed to 128.    She has some associated chest pain  / tightness with this tachycardia  She is moderately active - playing with her 76 year old daughter - she has some dyspnea with walking and playing with daughter. . Lives on 3rd floor apt. Has had difficulty climbing stairs recently.  Rests after 1 flight.   Smokes 1/2 ppd  No ETOH.    Current Outpatient Prescriptions on File Prior to Visit  Medication Sig Dispense Refill  . ciprofloxacin (CIPRO) 500 MG tablet Take 1 tablet (500 mg total) by mouth 2 (two) times daily. X 2 weeks  28 tablet  0  . clindamycin (CLINDAGEL) 1 % gel Apply 1 application topically 2 (two) times daily as needed (boil / rash).       Marland Kitchen doxycycline (VIBRA-TABS) 100 MG tablet Take 1 tablet (100 mg total) by mouth 2 (two) times daily.  14 tablet  2  . HYDROcodone-acetaminophen (NORCO/VICODIN) 5-325 MG per tablet Take 1-2 tablets by mouth every 4 (four) hours as needed for moderate pain.      Marland Kitchen insulin aspart (NOVOLOG) 100 UNIT/ML injection Inject 1-10 Units into the skin 3 (three) times daily before meals.       . insulin glargine (LANTUS) 100 UNIT/ML injection Inject 50 Units into the skin every morning.       . ondansetron (ZOFRAN ODT) 8 MG disintegrating tablet Take 1 tablet (8 mg total) by mouth every 8 (eight) hours as needed for nausea or vomiting.  20 tablet  0  . saccharomyces boulardii (FLORASTOR)  250 MG capsule Take 1 capsule (250 mg total) by mouth 2 (two) times daily.  60 capsule  3   No current facility-administered medications on file prior to visit.    Allergies  Allergen Reactions  . Latex Hives, Shortness Of Breath and Rash  . Levofloxacin Shortness Of Breath and Rash  . Moxifloxacin Shortness Of Breath and Rash  . Oxycodone-Acetaminophen Shortness Of Breath, Swelling and Rash    NORCO/VICODIN OK  . Peach [Prunus Persica] Hives and Shortness Of Breath  . Penicillins Swelling and Rash  . Potassium-Containing Compounds Other (See Comments)    IV ROUTE - CAUSES VEINS TO COLLAPSE  . Prednisone Other (See Comments)    SEVERE ELEVATION OF BLOOD SUGAR. Able to tolerate 40 mg  . Propoxyphene N-Acetaminophen Swelling    SWELLING OF FACE AND THROAT  . Rosiglitazone Maleate Swelling    SWELLING OF FACE AND LEGS  . Xolair [Omalizumab] Other (See Comments)    Rash and anaphylaxis  . Adhesive [Tape] Hives, Itching and Rash  . Morphine And Related Other (See Comments)    Causes hallucinations  . Cefaclor Rash  . Keflex [Cephalexin] Diarrhea and Rash  . Promethazine Hcl Other (See Comments)    IV  ROUTE ONLY - JITTERY FEELING. Patient reports that it is mild and she has used promethazine since then  . Sulfadiazine Rash    Past Medical History  Diagnosis Date  . Hx MRSA infection   . Migraine   . Vitamin D insufficiency   . Asthma     as a child  . GERD (gastroesophageal reflux disease)     no current meds.  . Hidradenitis 04/2012    bilat. thighs, left groin - open areas on thighs  . Diabetes mellitus     IDDM, Insulin pump; followed by Dr. [name]  . Abscess of left axilla - PREVOTELLA BIVIA & Staph Coag Neg 07/12/2012    MODERATE PREVOTELLA BIVIA Note: BETA LACTAMASE NEGATIVE    . PCOS (polycystic ovarian syndrome)   . Heart murmur   . Pneumonia     hx  . Depression     hx  . Cancer     skin tag rt breast  . Hydradenitis   . Cellulitis 06/01/2014    RT  INNER THIGH    Past Surgical History  Procedure Laterality Date  . Wisdom tooth extraction    . Tonsillectomy    . Bunionectomy      left  . Esophagogastroduodenoscopy  11/17/2011    Procedure: ESOPHAGOGASTRODUODENOSCOPY (EGD);  Surgeon: [name], MD;  Location: [facility];  Service: Endoscopy;  Laterality: N/A;  . Eye surgery      exc. stye left eye  . Breast surgery      right lumpectomy  . Cholecystectomy  12/02/2006    lap. chole.  . Dilation and evacuation  02/14/2007  . Hydradenitis excision  04/30/2012    Procedure: EXCISION HYDRADENITIS GROIN;  Surgeon: [name], MD;  Location: [facility];  Service: General;  Laterality: Left;  wide excision hidradenitis bilateral thighs and Left groin  . Hydradenitis excision Left 01/13/2014    Procedure: WIDE EXCISION HIDRADENITIS LEFT AXILLA;  Surgeon: [name], MD;  Location: [facility];  Service: General;  Laterality: Left;  . Irrigation and debridement abscess Right 06/02/2014    Procedure: IRRIGATION AND DEBRIDEMENT ABSCESS;  Surgeon: [name], MD;  Location: [facility];  Service: General;  Laterality: Right;    History  Smoking status  . Current Some Day Smoker -- 0.50 packs/day for 14 years  . Types: Cigarettes  Smokeless tobacco  . Never Used    Comment: 5 cig./day    History  Alcohol Use No    Family History  Problem Relation Age of Onset  . Hyperlipidemia Sister   . Hypertension Sister   . Hypertension Father     Reviw of Systems:  Reviewed in the HPI.  All other systems are negative.  Physical Exam: Blood pressure 104/80, pulse 97, height 5\' 5"  (1.651 m), weight 222 lb 8 oz (100.925 kg), last menstrual period 06/19/2014. Wt Readings from Last 3 Encounters:  06/30/14 222 lb 8 oz (100.925 kg)  06/01/14 221 lb (100.245 kg)  06/01/14 221 lb (100.245 kg)     General: Well developed, well nourished, in no acute distress.  Head: Normocephalic, atraumatic, sclera  non-icteric, mucus membranes are moist,   Neck: Supple. Carotids are 2 + without bruits. No JVD   Lungs: Clear   Heart: RR, normal S1S2, no murmurs   Abdomen: Soft, non-tender, non-distended with normal bowel sounds.  Msk:  Strength and tone are normal   Extremities: No clubbing or cyanosis. No edema.  Distal pedal pulses are  2+ and equal   Neuro: CN II - XII intact.  Alert and oriented X 3.   Psych:  Normal   ECG: Oct. 9, 2015:  NSR at 97.  No ST or T wave changes.   Assessment / Plan:   Addendum :  1. Chest discomfort 07/10/2014 [name] had a stress Myoview study that revealed anterior ischemia. I cannot completely rule out breast attenuation but the defect did appear to be reversible. She has a history of very poorly controlled diabetes and also has a history of cigarette smoking. He scheduled her for cardiac catheterization on Friday. Her blood pressure is too low to start isosorbide or a  beta blocker. She may need to be started on these in the future.  I discussed the risks, benefits, and options concerning cardiac catheterization.

## 2014-06-30 NOTE — Assessment & Plan Note (Signed)
I have strongly recommended that she stop smoking. She has type I DM ( poorly controled) and has a strong family hx of premature CAD

## 2014-06-30 NOTE — Patient Instructions (Addendum)
Stop smoking Will see you in about 1 month - either in [place] or [place].  [place]  Your caregiver has ordered a Stress Test with nuclear imaging. The purpose of this test is to evaluate the blood supply to your heart muscle. This procedure is referred to as a "Non-Invasive Stress Test." This is because other than having an IV started in your vein, nothing is inserted or "invades" your body. Cardiac stress tests are done to find areas of poor blood flow to the heart by determining the extent of coronary artery disease (CAD). Some patients exercise on a treadmill, which naturally increases the blood flow to your heart, while others who are  unable to walk on a treadmill due to physical limitations have a pharmacologic/chemical stress agent called Lexiscan . This medicine will mimic walking on a treadmill by temporarily increasing your coronary blood flow.   Please note: these test may take anywhere between 2-4 hours to complete    Date of Procedure:_______10/14/15______________________________  Arrival Time for Procedure:_____0815 am _________________________    PLEASE NOTIFY THE OFFICE AT LEAST 24 HOURS IN ADVANCE IF YOU ARE UNABLE TO KEEP YOUR APPOINTMENT.  [phone]  How to prepare for your Myoview test:  1. Do not eat or drink after midnight 2. No caffeine for 24 hours prior to test 3. No smoking 24 hours prior to test. 4. Your medication may be taken with water.  If your doctor stopped a medication because of this test, do not take that medication. 5. Ladies, please do not wear dresses.  Skirts or pants are appropriate. Please wear a short sleeve shirt. 6. No perfume, cologne or lotion. 7. Wear comfortable walking shoes. No heels!  Your physician has requested that you have an echocardiogram 07/05/14 0815 am. Echocardiography is a painless test that uses sound waves to create images of your heart. It provides your doctor with information about the size and shape of your  heart and how well your heart's chambers and valves are working. This procedure takes approximately one hour. There are no restrictions for this procedure.  Your physician recommends that you schedule a follow-up appointment in:  1 month with Dr. Acie Fredrickson

## 2014-07-04 ENCOUNTER — Other Ambulatory Visit (HOSPITAL_COMMUNITY): Payer: Self-pay | Admitting: Cardiovascular Disease

## 2014-07-04 DIAGNOSIS — R079 Chest pain, unspecified: Secondary | ICD-10-CM

## 2014-07-05 ENCOUNTER — Ambulatory Visit (HOSPITAL_BASED_OUTPATIENT_CLINIC_OR_DEPARTMENT_OTHER): Payer: 59

## 2014-07-05 ENCOUNTER — Ambulatory Visit (HOSPITAL_COMMUNITY): Payer: 59 | Attending: Cardiovascular Disease | Admitting: Radiology

## 2014-07-05 VITALS — BP 109/78 | Ht 65.0 in | Wt 225.0 lb

## 2014-07-05 DIAGNOSIS — R079 Chest pain, unspecified: Secondary | ICD-10-CM | POA: Diagnosis not present

## 2014-07-05 DIAGNOSIS — R0609 Other forms of dyspnea: Secondary | ICD-10-CM | POA: Insufficient documentation

## 2014-07-05 DIAGNOSIS — E119 Type 2 diabetes mellitus without complications: Secondary | ICD-10-CM | POA: Diagnosis not present

## 2014-07-05 DIAGNOSIS — R002 Palpitations: Secondary | ICD-10-CM | POA: Diagnosis not present

## 2014-07-05 DIAGNOSIS — R5383 Other fatigue: Secondary | ICD-10-CM | POA: Diagnosis not present

## 2014-07-05 DIAGNOSIS — R0789 Other chest pain: Secondary | ICD-10-CM

## 2014-07-05 DIAGNOSIS — I471 Supraventricular tachycardia: Secondary | ICD-10-CM

## 2014-07-05 DIAGNOSIS — R0602 Shortness of breath: Secondary | ICD-10-CM

## 2014-07-05 MED ORDER — TECHNETIUM TC 99M SESTAMIBI GENERIC - CARDIOLITE
30.0000 | Freq: Once | INTRAVENOUS | Status: AC | PRN
  Administered 2014-07-05: 30 via INTRAVENOUS

## 2014-07-05 MED ORDER — TECHNETIUM TC 99M SESTAMIBI GENERIC - CARDIOLITE
10.0000 | Freq: Once | INTRAVENOUS | Status: AC | PRN
  Administered 2014-07-05: 10 via INTRAVENOUS

## 2014-07-05 MED ORDER — AMINOPHYLLINE 25 MG/ML IV SOLN
75.0000 mg | Freq: Two times a day (BID) | INTRAVENOUS | Status: DC | PRN
  Administered 2014-07-05: 75 mg via INTRAVENOUS

## 2014-07-05 MED ORDER — REGADENOSON 0.4 MG/5ML IV SOLN
0.4000 mg | Freq: Once | INTRAVENOUS | Status: AC
  Administered 2014-07-05: 0.4 mg via INTRAVENOUS

## 2014-07-05 NOTE — Progress Notes (Signed)
[facility] [address] [place],  [address] [phone]    Cardiology Nuclear Med Study  [name] is a 33 y.o. female     MRN : [id number]     DOB: 05-24-1981  Procedure Date: 07/05/2014  Nuclear Med Background Indication for Stress Test:  Evaluation for Ischemia, and Patient seen in hospital on 06-28-2014 Fast Heart Rate, Hypotension, and Chest pain, Enzymes negative History:  Asthma and - Cardiac Risk Factors: IDDM   Symptoms:  Chest Pain, DOE, Fatigue and Palpitations   Nuclear Pre-Procedure Caffeine/Decaff Intake:  None> 12 hrs NPO After: 9:15pm   Lungs:  clear O2 Sat: 99% on room air. IV 0.9% NS with Angio Cath:  22g  IV Site: R Antecubital x 1, tolerated well  IV Started by:  Irven Baltimore, RN  Chest Size (in):  40 Cup Size: C  Height: 5\' 5"  (1.651 m)  Weight:  225 lb (102.059 kg)  BMI:  Body mass index is 37.44 kg/(m^2). Tech Comments:   Fasting CBG was 148 at 0730 today. Patient took full dose Lantus Insulin this am at 0730. Asymptomatic on arrival. Irven Baltimore, RN. Aminophylline 150 given for symptoms. All were resolved before leaving. S.Williams EMTP    Nuclear Med Study 1 or 2 day study: 1 day  Stress Test Type:  Lexiscan  Reading MD: N/A  Order Authorizing Provider:  Mertie Moores, MD  Resting Radionuclide: Technetium 57m Sestamibi  Resting Radionuclide Dose: 11. mCi   Stress Radionuclide:  Technetium 54m Sestamibi  Stress Radionuclide Dose: 33.0 mCi           Stress Protocol Rest HR: 73 Stress HR: 125  Rest BP: 109/78 Stress BP: 126/74  Exercise Time (min): n/a METS: n/a   Predicted Max HR: 188 bpm % Max HR: 66.49 bpm Rate Pressure Product: 15750   Dose of Adenosine (mg):  n/a Dose of Lexiscan: 0.4 mg  Dose of Atropine (mg): n/a Dose of Dobutamine: n/a mcg/kg/min (at max HR)  Stress Test Technologist: Perrin Maltese, EMT-P  Nuclear Technologist:  Earl Many, CNMT     Rest Procedure:  Myocardial  perfusion imaging was performed at rest 45 minutes following the intravenous administration of Technetium 48m Sestamibi. Rest ECG: NSR - Normal EKG  Stress Procedure:  The patient received IV Lexiscan 0.4 mg over 15-seconds.  Technetium 108m Sestamibi injected at 30-seconds. This patient was warm, flushed, and had chest pressure/ tightness with the Lexiscan injection.Quantitative spect images were obtained after a 45 minute delay. Stress ECG: There were T wave inversions in the inferolateral leads during infusion  QPS Raw Data Images:  Mild diaphragmatic and breast attenuation.  Normal left ventricular size. Stress Images:  There is a medium sized, moderate in intensity defect in the anterior wall and basal and mid inferolateral walls  Rest Images:  Normal homogeneous uptake in all areas of the myocardium. Subtraction (SDS):  There is a medium sized, moderate in intensity reversible defect in the anterior wall and basal and mid inferolateral walls consistent with ischemia. Transient Ischemic Dilatation (Normal <1.22):  0.96 Lung/Heart Ratio (Normal <0.45):  0.28  Quantitative Gated Spect Images QGS EDV:  106 ml QGS ESV:  62 ml  Impression Exercise Capacity:  Lexiscan with no exercise. BP Response:  Normal blood pressure response. Clinical Symptoms:  Typical chest pain. ECG Impression:  The patient developed diffuse T wave inversions in the inferolateral leads during Leixscan infusion. Comparison with Prior Nuclear Study: No images to  compare  Overall Impression:  High risk stress nuclear study with a medium sized, moderate in intensity reversible defect in the anterior wall and basal and mid  inferolateral walls consistent with ischemia.  The SDS was 4.  .  LV Ejection Fraction: 41%.  LV Wall Motion:  Mild to moderate LV dysfunction with global hypokinesis  Signed: Fransico Him, MD [facility] 07/05/2014

## 2014-07-05 NOTE — Progress Notes (Signed)
2D Echo completed. 07/05/2014

## 2014-07-06 ENCOUNTER — Encounter (HOSPITAL_COMMUNITY): Payer: 59

## 2014-07-07 ENCOUNTER — Other Ambulatory Visit: Payer: Self-pay

## 2014-07-10 ENCOUNTER — Other Ambulatory Visit: Payer: Self-pay | Admitting: Cardiovascular Disease

## 2014-07-10 DIAGNOSIS — R0789 Other chest pain: Secondary | ICD-10-CM | POA: Insufficient documentation

## 2014-07-10 DIAGNOSIS — Z01818 Encounter for other preprocedural examination: Secondary | ICD-10-CM

## 2014-07-10 NOTE — Assessment & Plan Note (Signed)
[name] had a stress Myoview study that revealed anterior ischemia. I cannot completely rule out breast attenuation but the defect did appear to be reversible. She has a history of very poorly controlled diabetes and also has a history of cigarette smoking. He scheduled her for cardiac catheterization on Friday. Her blood pressure is too low to start isosorbide or a  beta blocker. She may need to be started on these in the future.  I discussed the risks, benefits, and options concerning cardiac catheterization.

## 2014-07-11 ENCOUNTER — Encounter: Payer: Self-pay | Admitting: Nurse Practitioner

## 2014-07-11 ENCOUNTER — Telehealth: Payer: Self-pay | Admitting: Nurse Practitioner

## 2014-07-11 DIAGNOSIS — R9439 Abnormal result of other cardiovascular function study: Secondary | ICD-10-CM

## 2014-07-11 NOTE — Telephone Encounter (Signed)
Spoke with patient and labs ordered to be drawn at [facility] lab.  Pre-cath instructions reviewed with patient who verbalized understanding and agreement.  I advised patient to call back with questions or concerns prior to Friday.

## 2014-07-11 NOTE — Telephone Encounter (Signed)
Called patient and left message for patient to call me to schedule pre-cath labs

## 2014-07-12 ENCOUNTER — Other Ambulatory Visit (INDEPENDENT_AMBULATORY_CARE_PROVIDER_SITE_OTHER): Payer: 59

## 2014-07-12 DIAGNOSIS — R9439 Abnormal result of other cardiovascular function study: Secondary | ICD-10-CM

## 2014-07-12 LAB — CBC WITH DIFFERENTIAL/PLATELET
BASOS PCT: 0.5 % (ref 0.0–3.0)
Basophils Absolute: 0 10*3/uL (ref 0.0–0.1)
Eosinophils Absolute: 0.4 10*3/uL (ref 0.0–0.7)
Eosinophils Relative: 5 % (ref 0.0–5.0)
HCT: 42 % (ref 36.0–46.0)
HEMOGLOBIN: 14.3 g/dL (ref 12.0–15.0)
Lymphocytes Relative: 28.1 % (ref 12.0–46.0)
Lymphs Abs: 2.3 10*3/uL (ref 0.7–4.0)
MCHC: 34 g/dL (ref 30.0–36.0)
MCV: 91.4 fl (ref 78.0–100.0)
MONOS PCT: 5.8 % (ref 3.0–12.0)
Monocytes Absolute: 0.5 10*3/uL (ref 0.1–1.0)
NEUTROS ABS: 4.9 10*3/uL (ref 1.4–7.7)
Neutrophils Relative %: 60.6 % (ref 43.0–77.0)
Platelets: 299 10*3/uL (ref 150.0–400.0)
RBC: 4.59 Mil/uL (ref 3.87–5.11)
RDW: 13 % (ref 11.5–15.5)
WBC: 8.1 10*3/uL (ref 4.0–10.5)

## 2014-07-12 LAB — BASIC METABOLIC PANEL
BUN: 15 mg/dL (ref 6–23)
CHLORIDE: 106 meq/L (ref 96–112)
CO2: 19 meq/L (ref 19–32)
CREATININE: 0.8 mg/dL (ref 0.4–1.2)
Calcium: 9 mg/dL (ref 8.4–10.5)
GFR: 85.41 mL/min (ref >60.00)
GLUCOSE: 262 mg/dL — AB (ref 70–99)
Potassium: 3.7 mEq/L (ref 3.5–5.1)
Sodium: 137 mEq/L (ref 135–145)

## 2014-07-12 LAB — PROTIME-INR
INR: 1 ratio (ref 0.8–1.0)
PROTHROMBIN TIME: 11.5 s (ref 9.6–13.1)

## 2014-07-13 ENCOUNTER — Telehealth: Payer: Self-pay | Admitting: Internal Medicine

## 2014-07-13 ENCOUNTER — Encounter: Payer: Self-pay | Admitting: Nurse Practitioner

## 2014-07-13 NOTE — Telephone Encounter (Signed)
Pending cath, which may identify LV dysfunction as etiology for symptoms. Since she reports similar episodes in more remote past, we may want to ask he endocrinologist to consider possibility of carcinoid or pheo, POTS etc, for episodes of sweat and tachypalpitation with normal glucose.

## 2014-07-13 NOTE — Telephone Encounter (Signed)
At approx 2 pm, pt c/o palpitations and felt like she was going to faint with diaphoresis.   BP 140/86 Heart Rate 130 o2 sat 99% RA Blood Sugar 121. Dr. Annamaria Boots aware. Pt in wheelchair to rest.  VS rechecked at 2:30 pm: BP 110/84 Heart Rate 110 o2 sat 98% RA

## 2014-07-14 ENCOUNTER — Encounter (HOSPITAL_COMMUNITY)
Admission: RE | Disposition: A | Discharge status: Home / Self Care | Payer: Self-pay | Source: Ambulatory Visit | Attending: Cardiology

## 2014-07-14 ENCOUNTER — Ambulatory Visit (HOSPITAL_COMMUNITY)
Admission: RE | Admit: 2014-07-14 | Discharge: 2014-07-14 | Disposition: A | Discharge status: Home / Self Care | Payer: 59 | Source: Ambulatory Visit | Attending: Cardiology | Admitting: Cardiology

## 2014-07-14 ENCOUNTER — Encounter: Payer: Self-pay | Admitting: Cardiovascular Disease

## 2014-07-14 DIAGNOSIS — Z79891 Long term (current) use of opiate analgesic: Secondary | ICD-10-CM | POA: Insufficient documentation

## 2014-07-14 DIAGNOSIS — E108 Type 1 diabetes mellitus with unspecified complications: Secondary | ICD-10-CM | POA: Insufficient documentation

## 2014-07-14 DIAGNOSIS — Z720 Tobacco use: Secondary | ICD-10-CM | POA: Insufficient documentation

## 2014-07-14 DIAGNOSIS — R000 Tachycardia, unspecified: Secondary | ICD-10-CM | POA: Diagnosis not present

## 2014-07-14 DIAGNOSIS — R9439 Abnormal result of other cardiovascular function study: Secondary | ICD-10-CM | POA: Diagnosis present

## 2014-07-14 DIAGNOSIS — R0789 Other chest pain: Secondary | ICD-10-CM | POA: Insufficient documentation

## 2014-07-14 DIAGNOSIS — Z01818 Encounter for other preprocedural examination: Secondary | ICD-10-CM

## 2014-07-14 DIAGNOSIS — Z794 Long term (current) use of insulin: Secondary | ICD-10-CM | POA: Insufficient documentation

## 2014-07-14 DIAGNOSIS — R079 Chest pain, unspecified: Secondary | ICD-10-CM

## 2014-07-14 HISTORY — PX: LEFT HEART CATHETERIZATION WITH CORONARY ANGIOGRAM: SHX5451

## 2014-07-14 LAB — GLUCOSE, CAPILLARY
Glucose-Capillary: 217 mg/dL — ABNORMAL HIGH (ref 70–99)
Glucose-Capillary: 220 mg/dL — ABNORMAL HIGH (ref 70–99)
Glucose-Capillary: 217 mg/dL — ABNORMAL HIGH (ref 70–99)

## 2014-07-14 LAB — PREGNANCY, URINE: Preg Test, Ur: NEGATIVE

## 2014-07-14 SURGERY — LEFT HEART CATHETERIZATION WITH CORONARY ANGIOGRAM
Anesthesia: LOCAL

## 2014-07-14 MED ORDER — VERAPAMIL HCL 2.5 MG/ML IV SOLN
INTRAVENOUS | Status: AC
  Filled 2014-07-14: qty 2

## 2014-07-14 MED ORDER — SODIUM CHLORIDE 0.9 % IV SOLN
INTRAVENOUS | Status: DC
  Administered 2014-07-14: 75 mL/h via INTRAVENOUS

## 2014-07-14 MED ORDER — HEPARIN (PORCINE) IN NACL 2-0.9 UNIT/ML-% IJ SOLN
INTRAMUSCULAR | Status: AC
Start: 2014-07-14 — End: 2014-07-14
  Filled 2014-07-14: qty 1000

## 2014-07-14 MED ORDER — FENTANYL CITRATE 0.05 MG/ML IJ SOLN
INTRAMUSCULAR | Status: AC
  Filled 2014-07-14: qty 2

## 2014-07-14 MED ORDER — SODIUM CHLORIDE 0.9 % IV SOLN: INTRAVENOUS | Status: DC

## 2014-07-14 MED ORDER — SODIUM CHLORIDE 0.9 % IV SOLN: 250.0000 mL | INTRAVENOUS | Status: DC | PRN

## 2014-07-14 MED ORDER — MIDAZOLAM HCL 2 MG/2ML IJ SOLN
INTRAMUSCULAR | Status: AC
  Filled 2014-07-14: qty 2

## 2014-07-14 MED ORDER — LIDOCAINE HCL (PF) 1 % IJ SOLN
INTRAMUSCULAR | Status: AC
  Filled 2014-07-14: qty 30

## 2014-07-14 MED ORDER — ASPIRIN 81 MG PO CHEW: 81.0000 mg | CHEWABLE_TABLET | ORAL | Status: DC

## 2014-07-14 MED ORDER — ASPIRIN 81 MG PO CHEW
CHEWABLE_TABLET | ORAL | Status: AC
  Filled 2014-07-14: qty 1

## 2014-07-14 MED ORDER — SODIUM CHLORIDE 0.9 % IJ SOLN: 3.0000 mL | INTRAMUSCULAR | Status: DC | PRN

## 2014-07-14 MED ORDER — HEPARIN SODIUM (PORCINE) 1000 UNIT/ML IJ SOLN
INTRAMUSCULAR | Status: AC
  Filled 2014-07-14: qty 1

## 2014-07-14 MED ORDER — SODIUM CHLORIDE 0.9 % IJ SOLN: 3.0000 mL | Freq: Two times a day (BID) | INTRAMUSCULAR | Status: DC

## 2014-07-14 MED ORDER — NITROGLYCERIN 1 MG/10 ML FOR IR/CATH LAB
INTRA_ARTERIAL | Status: AC
  Filled 2014-07-14: qty 10

## 2014-07-14 NOTE — CV Procedure (Signed)
      Cardiac Catheterization Operative Report  [name] [id number] 10/23/201510:41 AM Vidal Schwalbe, MD  Procedure Performed:  1. Left Heart Catheterization 2. Selective Coronary Angiography 3. Left ventricular angiogram  Operator: Lauree Chandler, MD  Arterial access site:  Right radial artery.   Indication: 33 yo female with history of DM, tobacco abuse with recent chest pain c/w angina. Stress myoview with anterior wall defect, ischemia cannot be excluded.                                      Procedure Details: The risks, benefits, complications, treatment options, and expected outcomes were discussed with the patient. The patient and/or family concurred with the proposed plan, giving informed consent. The patient was brought to the cath lab after IV hydration was begun and oral premedication was given. The patient was further sedated with Versed and Fentanyl. The right wrist was assessed with a modified Allens test which was positive. The right wrist was prepped and draped in a sterile fashion. 1% lidocaine was used for local anesthesia. Using the modified Seldinger access technique, a 5 French sheath was placed in the right radial artery. 3 mg Verapamil was given through the sheath. 5500 units IV heparin was given. Standard diagnostic catheters were used to perform selective coronary angiography. The left main was engaged with a XB LAD 3.5 guiding catheter. A pigtail catheter was used to perform a left ventricular angiogram. The sheath was removed from the right radial artery and a Terumo hemostasis band was applied at the arteriotomy site on the right wrist.    There were no immediate complications. The patient was taken to the recovery area in stable condition.   Hemodynamic Findings: Central aortic pressure: 110/66 Left ventricular pressure: 117/6/15  Angiographic Findings:  Left main:  Long segment. No obstructive disease.   Left Anterior Descending Artery:  Large caliber vessel that courses to the apex. There are several small caliber diagonal branches. No obstructive disease.   Circumflex Artery: Moderate caliber vessel with several small caliber obtuse marginal branches. No obstructive disease.   Right Coronary Artery: Large dominant vessel with no obstructive disease.   Left Ventricular Angiogram: LVEF=55-60%.   Impression: 1. No angiographic evidence of CAD 2. Normal LV systolic function 3. Non-cardiac chest pain  Recommendations: No further ischemic evaluation.        Complications:  None. The patient tolerated the procedure well.

## 2014-07-14 NOTE — H&P (View-Only) (Signed)
[name] Date of Birth  04-20-1981       [facility] Office [address] [place], [place] [name]  [address]   [place], [place]  [address] [place]   Fax  [phone]     Fax [phone]  Problem List: 1. Tachycardia 2. Chest pain 3. 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  This occurred after walking a short distance.    HR 155.  Later slowed to 128.    She has some associated chest pain  / tightness with this tachycardia  She is moderately active - playing with her 40 year old daughter - she has some dyspnea with walking and playing with daughter. . Lives on 3rd floor apt. Has had difficulty climbing stairs recently.  Rests after 1 flight.   Smokes 1/2 ppd  No ETOH.    Current Outpatient Prescriptions on File Prior to Visit  Medication Sig Dispense Refill  . ciprofloxacin (CIPRO) 500 MG tablet Take 1 tablet (500 mg total) by mouth 2 (two) times daily. X 2 weeks  28 tablet  0  . clindamycin (CLINDAGEL) 1 % gel Apply 1 application topically 2 (two) times daily as needed (boil / rash).       Marland Kitchen doxycycline (VIBRA-TABS) 100 MG tablet Take 1 tablet (100 mg total) by mouth 2 (two) times daily.  14 tablet  2  . HYDROcodone-acetaminophen (NORCO/VICODIN) 5-325 MG per tablet Take 1-2 tablets by mouth every 4 (four) hours as needed for moderate pain.      Marland Kitchen insulin aspart (NOVOLOG) 100 UNIT/ML injection Inject 1-10 Units into the skin 3 (three) times daily before meals.       . insulin glargine (LANTUS) 100 UNIT/ML injection Inject 50 Units into the skin every morning.       . ondansetron (ZOFRAN ODT) 8 MG disintegrating tablet Take 1 tablet (8 mg total) by mouth every 8 (eight) hours as needed for nausea or vomiting.  20 tablet  0  . saccharomyces boulardii (FLORASTOR)  250 MG capsule Take 1 capsule (250 mg total) by mouth 2 (two) times daily.  60 capsule  3   No current facility-administered medications on file prior to visit.    Allergies  Allergen Reactions  . Latex Hives, Shortness Of Breath and Rash  . Levofloxacin Shortness Of Breath and Rash  . Moxifloxacin Shortness Of Breath and Rash  . Oxycodone-Acetaminophen Shortness Of Breath, Swelling and Rash    NORCO/VICODIN OK  . Peach [Prunus Persica] Hives and Shortness Of Breath  . Penicillins Swelling and Rash  . Potassium-Containing Compounds Other (See Comments)    IV ROUTE - CAUSES VEINS TO COLLAPSE  . Prednisone Other (See Comments)    SEVERE ELEVATION OF BLOOD SUGAR. Able to tolerate 40 mg  . Propoxyphene N-Acetaminophen Swelling    SWELLING OF FACE AND THROAT  . Rosiglitazone Maleate Swelling    SWELLING OF FACE AND LEGS  . Xolair [Omalizumab] Other (See Comments)    Rash and anaphylaxis  . Adhesive [Tape] Hives, Itching and Rash  . Morphine And Related Other (See Comments)    Causes hallucinations  . Cefaclor Rash  . Keflex [Cephalexin] Diarrhea and Rash  . Promethazine Hcl Other (See Comments)    IV  ROUTE ONLY - JITTERY FEELING. Patient reports that it is mild and she has used promethazine since then  . Sulfadiazine Rash    Past Medical History  Diagnosis Date  . Hx MRSA infection   . Migraine   . Vitamin D insufficiency   . Asthma     as a child  . GERD (gastroesophageal reflux disease)     no current meds.  . Hidradenitis 04/2012    bilat. thighs, left groin - open areas on thighs  . Diabetes mellitus     IDDM, Insulin pump; followed by Dr. [name]  . Abscess of left axilla - PREVOTELLA BIVIA & Staph Coag Neg 07/12/2012    MODERATE PREVOTELLA BIVIA Note: BETA LACTAMASE NEGATIVE    . PCOS (polycystic ovarian syndrome)   . Heart murmur   . Pneumonia     hx  . Depression     hx  . Cancer     skin tag rt breast  . Hydradenitis   . Cellulitis 06/01/2014    RT  INNER THIGH    Past Surgical History  Procedure Laterality Date  . Wisdom tooth extraction    . Tonsillectomy    . Bunionectomy      left  . Esophagogastroduodenoscopy  11/17/2011    Procedure: ESOPHAGOGASTRODUODENOSCOPY (EGD);  Surgeon: [name], MD;  Location: [facility];  Service: Endoscopy;  Laterality: N/A;  . Eye surgery      exc. stye left eye  . Breast surgery      right lumpectomy  . Cholecystectomy  12/02/2006    lap. chole.  . Dilation and evacuation  02/14/2007  . Hydradenitis excision  04/30/2012    Procedure: EXCISION HYDRADENITIS GROIN;  Surgeon: [name], MD;  Location: [facility];  Service: General;  Laterality: Left;  wide excision hidradenitis bilateral thighs and Left groin  . Hydradenitis excision Left 01/13/2014    Procedure: WIDE EXCISION HIDRADENITIS LEFT AXILLA;  Surgeon: [name], MD;  Location: [facility];  Service: General;  Laterality: Left;  . Irrigation and debridement abscess Right 06/02/2014    Procedure: IRRIGATION AND DEBRIDEMENT ABSCESS;  Surgeon: [name], MD;  Location: [facility];  Service: General;  Laterality: Right;    History  Smoking status  . Current Some Day Smoker -- 0.50 packs/day for 14 years  . Types: Cigarettes  Smokeless tobacco  . Never Used    Comment: 5 cig./day    History  Alcohol Use No    Family History  Problem Relation Age of Onset  . Hyperlipidemia Sister   . Hypertension Sister   . Hypertension Father     Reviw of Systems:  Reviewed in the HPI.  All other systems are negative.  Physical Exam: Blood pressure 104/80, pulse 97, height 5\' 5"  (1.651 m), weight 222 lb 8 oz (100.925 kg), last menstrual period 06/19/2014. Wt Readings from Last 3 Encounters:  06/30/14 222 lb 8 oz (100.925 kg)  06/01/14 221 lb (100.245 kg)  06/01/14 221 lb (100.245 kg)     General: Well developed, well nourished, in no acute distress.  Head: Normocephalic, atraumatic, sclera  non-icteric, mucus membranes are moist,   Neck: Supple. Carotids are 2 + without bruits. No JVD   Lungs: Clear   Heart: RR, normal S1S2, no murmurs   Abdomen: Soft, non-tender, non-distended with normal bowel sounds.  Msk:  Strength and tone are normal   Extremities: No clubbing or cyanosis. No edema.  Distal pedal pulses are  2+ and equal   Neuro: CN II - XII intact.  Alert and oriented X 3.   Psych:  Normal   ECG: Oct. 9, 2015:  NSR at 97.  No ST or T wave changes.   Assessment / Plan:   Addendum :  1. Chest discomfort 07/10/2014 Gaylene had a stress Myoview study that revealed anterior ischemia. I cannot completely rule out breast attenuation but the defect did appear to be reversible. She has a history of very poorly controlled diabetes and also has a history of cigarette smoking. He scheduled her for cardiac catheterization on Friday. Her blood pressure is too low to start isosorbide or a  beta blocker. She may need to be started on these in the future.  I discussed the risks, benefits, and options concerning cardiac catheterization.

## 2014-07-14 NOTE — Interval H&P Note (Signed)
History and Physical Interval Note:  07/14/2014 9:55 AM  [name]  has presented today for cardiac cath with the diagnosis of cp, abnormal myoview. The various methods of treatment have been discussed with the patient and family. After consideration of risks, benefits and other options for treatment, the patient has consented to  Procedure(s): LEFT HEART CATHETERIZATION WITH CORONARY ANGIOGRAM (N/A) as a surgical intervention .  The patient's history has been reviewed, patient examined, no change in status, stable for surgery.  I have reviewed the patient's chart and labs.  Questions were answered to the patient's satisfaction.    Cath Lab Visit (complete for each Cath Lab visit)  Clinical Evaluation Leading to the Procedure:   ACS: No.  Non-ACS:    Anginal Classification: CCS III  Anti-ischemic medical therapy: No Therapy  Non-Invasive Test Results: High-risk stress test findings: cardiac mortality >3%/year  Prior CABG: No previous CABG         MCALHANY,CHRISTOPHER

## 2014-07-14 NOTE — Discharge Instructions (Signed)
Radial Site Care °Refer to this sheet in the next few weeks. These instructions provide you with information on caring for yourself after your procedure. Your caregiver may also give you more specific instructions. Your treatment has been planned according to current medical practices, but problems sometimes occur. Call your caregiver if you have any problems or questions after your procedure. °HOME CARE INSTRUCTIONS °· You may shower the day after the procedure. Remove the bandage (dressing) and gently wash the site with plain soap and water. Gently pat the site dry. °· Do not apply powder or lotion to the site. °· Do not submerge the affected site in water for 3 to 5 days. °· Inspect the site at least twice daily. °· Do not flex or bend the affected arm for 24 hours. °· No lifting over 5 pounds (2.3 kg) for 5 days after your procedure. °· Do not drive home if you are discharged the same day of the procedure. Have someone else drive you. °· You may drive 24 hours after the procedure unless otherwise instructed by your caregiver. °· Do not operate machinery or power tools for 24 hours. °· A responsible adult should be with you for the first 24 hours after you arrive home. °What to expect: °· Any bruising will usually fade within 1 to 2 weeks. °· Blood that collects in the tissue (hematoma) may be painful to the touch. It should usually decrease in size and tenderness within 1 to 2 weeks. °SEEK IMMEDIATE MEDICAL CARE IF: °· You have unusual pain at the radial site. °· You have redness, warmth, swelling, or pain at the radial site. °· You have drainage (other than a small amount of blood on the dressing). °· You have chills. °· You have a fever or persistent symptoms for more than 72 hours. °· You have a fever and your symptoms suddenly get worse. °· Your arm becomes pale, cool, tingly, or numb. °· You have heavy bleeding from the site. Hold pressure on the site and call 911. °Document Released: 10/11/2010 Document  Revised: 12/01/2011 Document Reviewed: 10/11/2010 °ExitCare® Patient Information ©2015 [employer]. This information is not intended to replace advice given to you by your health care provider. Make sure you discuss any questions you have with your health care provider. ° °

## 2014-07-17 ENCOUNTER — Telehealth: Payer: Self-pay | Admitting: Cardiovascular Disease

## 2014-07-17 NOTE — Telephone Encounter (Signed)
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  Patient states she will call back if additional work clearance is needed.  I discussed signs/symptoms to be aware of in right arm where patient had catheterization and pt will call back if she develops concerning symptoms.

## 2014-07-17 NOTE — Telephone Encounter (Signed)
New message  ° ° °Patient calling back to speak with nurse  °

## 2014-07-24 ENCOUNTER — Encounter: Payer: Self-pay | Admitting: Cardiovascular Disease

## 2014-08-04 ENCOUNTER — Ambulatory Visit (INDEPENDENT_AMBULATORY_CARE_PROVIDER_SITE_OTHER): Payer: 59 | Admitting: Cardiovascular Disease

## 2014-08-04 ENCOUNTER — Encounter: Payer: Self-pay | Admitting: Cardiovascular Disease

## 2014-08-04 VITALS — BP 126/80 | HR 103 | Ht 65.0 in | Wt 225.2 lb

## 2014-08-04 DIAGNOSIS — R0789 Other chest pain: Secondary | ICD-10-CM

## 2014-08-04 NOTE — Progress Notes (Signed)
[name] Date of Birth  1980-11-15       [facility] Office [address], [address] [place], [place], [place]  [address]   [place], [place]  [id number] [address]   Fax  [phone]     Fax [phone]  Problem List: 1. Tachycardia 2. Chest pain 3. 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  This occurred after walking a short distance.    HR 155.  Later slowed to 128.    She has some associated chest pain  / tightness with this tachycardia  She is moderately active - playing with her 74 year old daughter - she has some dyspnea with walking and playing with daughter. . Lives on 3rd floor apt. Has had difficulty climbing stairs recently.  Rests after 1 flight.   Smokes 1/2 ppd  No ETOH.  Nov. 13, 2015:  Shala is a 33 y.o.  Female who I saw a month ago for tachypalpitations and chest.  Her stress Myoview study revealed reveals no abnormalities and she was referred for cardiac cath. Elizabelle is doing well.  Her cath revealed normal coronaries. She has noticed that her episodes of chest pain seem to be more prominent close to the timing of her period. Glucose levels have been better.      Current Outpatient Prescriptions on File Prior to Visit  Medication Sig Dispense Refill  . clindamycin (CLINDAGEL) 1 % gel Apply 1 application topically 2 (two) times daily as needed (boil / rash).     Marland Kitchen HYDROcodone-acetaminophen (NORCO/VICODIN) 5-325 MG per tablet Take 1-2 tablets by mouth every 4 (four) hours as needed for moderate pain.    Marland Kitchen insulin aspart (NOVOLOG) 100 UNIT/ML injection Inject 1-10 Units into the skin 3 (three) times daily before meals.     . insulin glargine (LANTUS) 100 UNIT/ML injection Inject 50 Units into the skin every morning.     Marland Kitchen levonorgestrel (MIRENA) 20 MCG/24HR  IUD 1 each by Intrauterine route once.     No current facility-administered medications on file prior to visit.    Allergies  Allergen Reactions  . Latex Hives, Shortness Of Breath and Rash  . Levofloxacin Shortness Of Breath and Rash  . Moxifloxacin Shortness Of Breath and Rash  . Oxycodone-Acetaminophen Shortness Of Breath, Swelling and Rash    NORCO/VICODIN OK  . Peach [Prunus Persica] Hives and Shortness Of Breath  . Penicillins Swelling and Rash  . Potassium-Containing Compounds Other (See Comments)    IV ROUTE - CAUSES VEINS TO COLLAPSE  . Prednisone Other (See Comments)    SEVERE ELEVATION OF BLOOD SUGAR. Able to tolerate 40 mg  . Propoxyphene N-Acetaminophen Swelling    SWELLING OF FACE AND THROAT  . Rosiglitazone Maleate Swelling    SWELLING OF FACE AND LEGS  . Xolair [Omalizumab] Other (See Comments)    Rash and anaphylaxis  . Adhesive [Tape] Hives, Itching and Rash  . Morphine And Related Other (See Comments)    Causes hallucinations  . Cefaclor Rash  . Keflex [Cephalexin] Diarrhea and Rash  . Promethazine Hcl Other (See Comments)    IV ROUTE ONLY - JITTERY FEELING. Patient reports that it is mild and she has used promethazine since then  . Sulfadiazine Rash  Past Medical History  Diagnosis Date  . Hx MRSA infection   . Migraine   . Vitamin D insufficiency   . Asthma     as a child  . GERD (gastroesophageal reflux disease)     no current meds.  . Hidradenitis 04/2012    bilat. thighs, left groin - open areas on thighs  . Diabetes mellitus     IDDM, Insulin pump; followed by Dr. [name]  . Abscess of left axilla - PREVOTELLA BIVIA & Staph Coag Neg 07/12/2012    MODERATE PREVOTELLA BIVIA Note: BETA LACTAMASE NEGATIVE    . PCOS (polycystic ovarian syndrome)   . Heart murmur   . Pneumonia     hx  . Depression     hx  . Cancer     skin tag rt breast  . Hydradenitis   . Cellulitis 06/01/2014    RT INNER THIGH    Past Surgical History    Procedure Laterality Date  . Wisdom tooth extraction    . Tonsillectomy    . Bunionectomy      left  . Esophagogastroduodenoscopy  11/17/2011    Procedure: ESOPHAGOGASTRODUODENOSCOPY (EGD);  Surgeon: [name], MD;  Location: [facility];  Service: Endoscopy;  Laterality: N/A;  . Eye surgery      exc. stye left eye  . Breast surgery      right lumpectomy  . Cholecystectomy  12/02/2006    lap. chole.  . Dilation and evacuation  02/14/2007  . Hydradenitis excision  04/30/2012    Procedure: EXCISION HYDRADENITIS GROIN;  Surgeon: [name], MD;  Location: [facility];  Service: General;  Laterality: Left;  wide excision hidradenitis bilateral thighs and Left groin  . Hydradenitis excision Left 01/13/2014    Procedure: WIDE EXCISION HIDRADENITIS LEFT AXILLA;  Surgeon: [name], MD;  Location: [facility];  Service: General;  Laterality: Left;  . Irrigation and debridement abscess Right 06/02/2014    Procedure: IRRIGATION AND DEBRIDEMENT ABSCESS;  Surgeon: [name], MD;  Location: [facility];  Service: General;  Laterality: Right;    History  Smoking status  . Current Some Day Smoker -- 0.50 packs/day for 14 years  . Types: Cigarettes  Smokeless tobacco  . Never Used    Comment: 5 cig./day    History  Alcohol Use No    Family History  Problem Relation Age of Onset  . Hyperlipidemia Sister   . Hypertension Sister   . Hypertension Father     Reviw of Systems:  Reviewed in the HPI.  All other systems are negative.  Physical Exam: Blood pressure 126/80, pulse 103, height 5\' 5"  (1.651 m), weight 225 lb 3.2 oz (102.15 kg), last menstrual period 07/07/2014. Wt Readings from Last 3 Encounters:  08/04/14 225 lb 3.2 oz (102.15 kg)  07/14/14 222 lb (100.699 kg)  07/05/14 225 lb (102.059 kg)     General: Well developed, well nourished, in no acute distress.  Head: Normocephalic, atraumatic, sclera non-icteric, mucus membranes are moist,    Neck: Supple. Carotids are 2 + without bruits. No JVD   Lungs: Clear   Heart: RR, normal S1S2, no murmurs   Abdomen: Soft, non-tender, non-distended with normal bowel sounds.  Msk:  Strength and tone are normal   Extremities: No clubbing or cyanosis. No edema.  Distal pedal pulses are 2+ and equal   Neuro: CN II - XII intact.  Alert and oriented X 3.   Psych:  Normal  ECG: Oct. 9, 2015:  NSR at 97.  No ST or T wave changes.   Assessment / Plan:   Addendum :  1. Chest discomfort 07/10/2014 Khadijah had a stress Myoview study that revealed anterior ischemia. I cannot completely rule out breast attenuation but the defect did appear to be reversible. She has a history of very poorly controlled diabetes and also has a history of cigarette smoking. He scheduled her for cardiac catheterization on Friday. Her blood pressure is too low to start isosorbide or a  beta blocker. She may need to be started on these in the future.  I discussed the risks, benefits, and options concerning cardiac catheterization.

## 2014-08-04 NOTE — Patient Instructions (Signed)
Your physician recommends that you continue on your current medications as directed. Please refer to the Current Medication list given to you today.  Your physician recommends that you schedule a follow-up appointment in: as needed with Dr. Nahser  

## 2014-08-04 NOTE — Assessment & Plan Note (Signed)
[name] presented with hx of CP and tachypalpitations. Her Myoview revealed some abnormalities and the subsequent cardiac examination revealed normal coronary arteries.  Since that time her glucose levels have been much better controlled. She has almost quit smoking completely. She is feeling much better  She thinks that her episodes of tachycardia and CP  are related to her periods.  These may be related to hormonal changes.    I do not think that I need to see her on a regular basis but would be happy to see her as needed.

## 2014-08-15 ENCOUNTER — Encounter: Payer: 59 | Admitting: Interventional Cardiology

## 2014-08-30 ENCOUNTER — Ambulatory Visit: Payer: 59 | Admitting: Interventional Cardiology

## 2014-08-31 ENCOUNTER — Encounter (HOSPITAL_COMMUNITY): Payer: Self-pay | Admitting: Cardiology

## 2014-09-20 ENCOUNTER — Emergency Department (HOSPITAL_COMMUNITY)
Admission: EM | Admit: 2014-09-20 | Discharge: 2014-09-20 | Disposition: A | Discharge status: Home / Self Care | Payer: 59 | Attending: Emergency Medicine | Admitting: Emergency Medicine

## 2014-09-20 ENCOUNTER — Encounter (HOSPITAL_COMMUNITY): Payer: Self-pay | Admitting: *Deleted

## 2014-09-20 DIAGNOSIS — Z8701 Personal history of pneumonia (recurrent): Secondary | ICD-10-CM | POA: Insufficient documentation

## 2014-09-20 DIAGNOSIS — Z880 Allergy status to penicillin: Secondary | ICD-10-CM | POA: Insufficient documentation

## 2014-09-20 DIAGNOSIS — L0390 Cellulitis, unspecified: Secondary | ICD-10-CM

## 2014-09-20 DIAGNOSIS — Z8679 Personal history of other diseases of the circulatory system: Secondary | ICD-10-CM | POA: Diagnosis not present

## 2014-09-20 DIAGNOSIS — Z8619 Personal history of other infectious and parasitic diseases: Secondary | ICD-10-CM | POA: Insufficient documentation

## 2014-09-20 DIAGNOSIS — R011 Cardiac murmur, unspecified: Secondary | ICD-10-CM | POA: Diagnosis not present

## 2014-09-20 DIAGNOSIS — Z720 Tobacco use: Secondary | ICD-10-CM | POA: Diagnosis not present

## 2014-09-20 DIAGNOSIS — Z85828 Personal history of other malignant neoplasm of skin: Secondary | ICD-10-CM | POA: Insufficient documentation

## 2014-09-20 DIAGNOSIS — Z792 Long term (current) use of antibiotics: Secondary | ICD-10-CM | POA: Diagnosis not present

## 2014-09-20 DIAGNOSIS — Z794 Long term (current) use of insulin: Secondary | ICD-10-CM | POA: Diagnosis not present

## 2014-09-20 DIAGNOSIS — L0291 Cutaneous abscess, unspecified: Secondary | ICD-10-CM

## 2014-09-20 DIAGNOSIS — Z8742 Personal history of other diseases of the female genital tract: Secondary | ICD-10-CM | POA: Insufficient documentation

## 2014-09-20 DIAGNOSIS — Z3202 Encounter for pregnancy test, result negative: Secondary | ICD-10-CM | POA: Insufficient documentation

## 2014-09-20 DIAGNOSIS — Z8659 Personal history of other mental and behavioral disorders: Secondary | ICD-10-CM | POA: Diagnosis not present

## 2014-09-20 DIAGNOSIS — L03116 Cellulitis of left lower limb: Secondary | ICD-10-CM | POA: Diagnosis not present

## 2014-09-20 DIAGNOSIS — E1165 Type 2 diabetes mellitus with hyperglycemia: Secondary | ICD-10-CM | POA: Insufficient documentation

## 2014-09-20 DIAGNOSIS — Z8719 Personal history of other diseases of the digestive system: Secondary | ICD-10-CM | POA: Insufficient documentation

## 2014-09-20 DIAGNOSIS — L02416 Cutaneous abscess of left lower limb: Secondary | ICD-10-CM | POA: Insufficient documentation

## 2014-09-20 DIAGNOSIS — Z91040 Latex allergy status: Secondary | ICD-10-CM | POA: Diagnosis not present

## 2014-09-20 DIAGNOSIS — R739 Hyperglycemia, unspecified: Secondary | ICD-10-CM

## 2014-09-20 DIAGNOSIS — J45909 Unspecified asthma, uncomplicated: Secondary | ICD-10-CM | POA: Diagnosis not present

## 2014-09-20 LAB — BASIC METABOLIC PANEL
ANION GAP: 7 (ref 5–15)
BUN: 9 mg/dL (ref 6–23)
CO2: 23 mmol/L (ref 19–32)
Calcium: 9.7 mg/dL (ref 8.4–10.5)
Chloride: 105 mEq/L (ref 96–112)
Creatinine, Ser: 0.64 mg/dL (ref 0.50–1.10)
GFR calc Af Amer: >90 mL/min (ref >90)
GFR calc non Af Amer: >90 mL/min (ref >90)
GLUCOSE: 319 mg/dL — AB (ref 70–99)
Potassium: 3.7 mmol/L (ref 3.5–5.1)
SODIUM: 135 mmol/L (ref 135–145)

## 2014-09-20 LAB — URINALYSIS, ROUTINE W REFLEX MICROSCOPIC
Bilirubin Urine: NEGATIVE
Glucose, UA: >1000 mg/dL — AB
Hgb urine dipstick: NEGATIVE
Ketones, ur: NEGATIVE mg/dL
LEUKOCYTES UA: NEGATIVE
NITRITE: NEGATIVE
PH: 5 (ref 5.0–8.0)
Protein, ur: NEGATIVE mg/dL
Specific Gravity, Urine: 1.043 — ABNORMAL HIGH (ref 1.005–1.030)
Urobilinogen, UA: 0.2 mg/dL (ref 0.0–1.0)

## 2014-09-20 LAB — CBC WITH DIFFERENTIAL/PLATELET
BASOS ABS: 0.1 10*3/uL (ref 0.0–0.1)
Basophils Relative: 1 % (ref 0–1)
Eosinophils Absolute: 0.2 10*3/uL (ref 0.0–0.7)
Eosinophils Relative: 3 % (ref 0–5)
HEMATOCRIT: 43.4 % (ref 36.0–46.0)
HEMOGLOBIN: 14.9 g/dL (ref 12.0–15.0)
LYMPHS ABS: 2.7 10*3/uL (ref 0.7–4.0)
Lymphocytes Relative: 33 % (ref 12–46)
MCH: 31 pg (ref 26.0–34.0)
MCHC: 34.3 g/dL (ref 30.0–36.0)
MCV: 90.4 fL (ref 78.0–100.0)
Monocytes Absolute: 0.5 10*3/uL (ref 0.1–1.0)
Monocytes Relative: 6 % (ref 3–12)
NEUTROS ABS: 4.8 10*3/uL (ref 1.7–7.7)
Neutrophils Relative %: 57 % (ref 43–77)
Platelets: 314 10*3/uL (ref 150–400)
RBC: 4.8 MIL/uL (ref 3.87–5.11)
RDW: 12.3 % (ref 11.5–15.5)
WBC: 8.2 10*3/uL (ref 4.0–10.5)

## 2014-09-20 LAB — URINE MICROSCOPIC-ADD ON

## 2014-09-20 LAB — POC URINE PREG, ED: Preg Test, Ur: NEGATIVE

## 2014-09-20 LAB — CBG MONITORING, ED
GLUCOSE-CAPILLARY: 306 mg/dL — AB (ref 70–99)
Glucose-Capillary: 65 mg/dL — ABNORMAL LOW (ref 70–99)
Glucose-Capillary: 71 mg/dL (ref 70–99)

## 2014-09-20 MED ORDER — SODIUM CHLORIDE 0.9 % IV BOLUS (SEPSIS)
1000.0000 mL | Freq: Once | INTRAVENOUS | Status: AC
  Administered 2014-09-20: 1000 mL via INTRAVENOUS

## 2014-09-20 MED ORDER — ONDANSETRON HCL 4 MG/2ML IJ SOLN
4.0000 mg | Freq: Once | INTRAMUSCULAR | Status: AC
  Administered 2014-09-20: 4 mg via INTRAVENOUS
  Filled 2014-09-20: qty 2

## 2014-09-20 MED ORDER — CLINDAMYCIN HCL 150 MG PO CAPS: 300.0000 mg | ORAL_CAPSULE | Freq: Four times a day (QID) | ORAL | Status: DC

## 2014-09-20 MED ORDER — CLINDAMYCIN PHOSPHATE 600 MG/50ML IV SOLN
600.0000 mg | Freq: Once | INTRAVENOUS | Status: AC
  Administered 2014-09-20: 600 mg via INTRAVENOUS
  Filled 2014-09-20: qty 50

## 2014-09-20 MED ORDER — LIDOCAINE-EPINEPHRINE (PF) 2 %-1:200000 IJ SOLN
20.0000 mL | Freq: Once | INTRAMUSCULAR | Status: AC
  Administered 2014-09-20: 20 mL
  Filled 2014-09-20: qty 20

## 2014-09-20 NOTE — ED Notes (Signed)
Pt reports having abscess on her leg and redness is spreading down her leg. This am, pt feels "foggy" has cbg > 300 and n/v/d today.

## 2014-09-20 NOTE — ED Provider Notes (Signed)
CSN: [id number]     Arrival date & time 09/20/14  1144 History   First MD Initiated Contact with Patient 09/20/14 1201     Chief Complaint  Patient presents with  . Abscess  . Emesis     (Consider location/radiation/quality/duration/timing/severity/associated sxs/prior Treatment) HPI Comments: Patient with history of type 1 diabetes, DKA, MRSA infection -- presents with complaint of left leg redness and swelling starting last night and becoming much worse overnight. Upon awaking this morning patient's has had lightheadedness, nausea, vomiting, and diarrhea. She has some mild abdominal pain. Her symptoms remind her previous DKA. No fevers, URI symptoms, chest pain, cough. No urinary symptoms. She has not noted any streaking along her leg. Patient takes NovoLog and Lantus. She does not have an insulin pump. No other treatments prior to arrival.  Patient is a 33 y.o. female presenting with abscess and vomiting. The history is provided by the patient and medical records.  Abscess Associated symptoms: nausea and vomiting   Associated symptoms: no fever and no headaches   Emesis Associated symptoms: abdominal pain and diarrhea   Associated symptoms: no headaches, no myalgias and no sore throat     Past Medical History  Diagnosis Date  . Hx MRSA infection   . Migraine   . Vitamin D insufficiency   . Asthma     as a child  . GERD (gastroesophageal reflux disease)     no current meds.  . Hidradenitis 04/2012    bilat. thighs, left groin - open areas on thighs  . Diabetes mellitus     IDDM, Insulin pump; followed by Dr. [name]  . Abscess of left axilla - PREVOTELLA BIVIA & Staph Coag Neg 07/12/2012    MODERATE PREVOTELLA BIVIA Note: BETA LACTAMASE NEGATIVE    . PCOS (polycystic ovarian syndrome)   . Heart murmur   . Pneumonia     hx  . Depression     hx  . Cancer     skin tag rt breast  . Hydradenitis   . Cellulitis 06/01/2014    RT INNER THIGH   Past Surgical History   Procedure Laterality Date  . Wisdom tooth extraction    . Tonsillectomy    . Bunionectomy      left  . Esophagogastroduodenoscopy  11/17/2011    Procedure: ESOPHAGOGASTRODUODENOSCOPY (EGD);  Surgeon: [name], MD;  Location: [facility];  Service: Endoscopy;  Laterality: N/A;  . Eye surgery      exc. stye left eye  . Breast surgery      right lumpectomy  . Cholecystectomy  12/02/2006    lap. chole.  . Dilation and evacuation  02/14/2007  . Hydradenitis excision  04/30/2012    Procedure: EXCISION HYDRADENITIS GROIN;  Surgeon: [name], MD;  Location: [facility];  Service: General;  Laterality: Left;  wide excision hidradenitis bilateral thighs and Left groin  . Hydradenitis excision Left 01/13/2014    Procedure: WIDE EXCISION HIDRADENITIS LEFT AXILLA;  Surgeon: [name], MD;  Location: [facility];  Service: General;  Laterality: Left;  . Irrigation and debridement abscess Right 06/02/2014    Procedure: IRRIGATION AND DEBRIDEMENT ABSCESS;  Surgeon: [name], MD;  Location: [facility];  Service: General;  Laterality: Right;  . Left heart catheterization with coronary angiogram N/A 07/14/2014    Procedure: LEFT HEART CATHETERIZATION WITH CORONARY ANGIOGRAM;  Surgeon: [name], MD;  Location: [facility];  Service: Cardiovascular;  Laterality: N/A;   Family History  Problem Relation Age of Onset  . Hyperlipidemia Sister   . Hypertension Sister   . Hypertension Father    History  Substance Use Topics  . Smoking status: Current Some Day Smoker -- 0.50 packs/day for 14 years    Types: Cigarettes  . Smokeless tobacco: Never Used     Comment: 5 cig./day  . Alcohol Use: No   OB History    Gravida Para Term Preterm AB TAB SAB Ectopic Multiple Living   4    3  3   1      Review of Systems  Constitutional: Negative for fever.  HENT: Negative for rhinorrhea and sore throat.   Eyes: Negative for redness.  Respiratory: Negative for cough.    Cardiovascular: Negative for chest pain.  Gastrointestinal: Positive for nausea, vomiting, abdominal pain and diarrhea.  Genitourinary: Negative for dysuria.  Musculoskeletal: Negative for myalgias.  Skin: Positive for color change and rash.       Positive for abscess.  Neurological: Negative for headaches.  Hematological: Negative for adenopathy.    Allergies  Latex; Levofloxacin; Moxifloxacin; Oxycodone-acetaminophen; Peach; Penicillins; Potassium-containing compounds; Prednisone; Propoxyphene n-acetaminophen; Rosiglitazone maleate; Xolair; Adhesive; Morphine and related; Cefaclor; Keflex; Promethazine hcl; and Sulfadiazine  Home Medications   Prior to Admission medications   Medication Sig Start Date End Date Taking? Authorizing Provider  clindamycin (CLINDAGEL) 1 % gel Apply 1 application topically 2 (two) times daily as needed (boil / rash).     Historical Provider, MD  doxycycline (VIBRAMYCIN) 50 MG capsule Take 50 mg by mouth daily. 05/02/14   Historical Provider, MD  HYDROcodone-acetaminophen (NORCO/VICODIN) 5-325 MG per tablet Take 1-2 tablets by mouth every 4 (four) hours as needed for moderate pain.    Historical Provider, MD  insulin aspart (NOVOLOG) 100 UNIT/ML injection Inject 1-10 Units into the skin 3 (three) times daily before meals.     Historical Provider, MD  insulin glargine (LANTUS) 100 UNIT/ML injection Inject 50 Units into the skin every morning.  05/10/13   Charlynne Cousins, MD  levonorgestrel (MIRENA) 20 MCG/24HR IUD 1 each by Intrauterine route once.    Historical Provider, MD  mupirocin cream (BACTROBAN) 2 % Apply 1 application topically as needed.  05/26/14   Historical Provider, MD   BP 124/87 mmHg  Pulse 102  Temp(Src) 98.1 F (36.7 C) (Oral)  Resp 18  SpO2 97%  LMP 09/13/2014   Physical Exam  Constitutional: She appears well-developed and well-nourished.  HENT:  Head: Normocephalic and atraumatic.  Eyes: Conjunctivae are normal. Right eye exhibits  no discharge. Left eye exhibits no discharge.  Neck: Normal range of motion. Neck supple.  Cardiovascular: Normal rate, regular rhythm and normal heart sounds.   Pulmonary/Chest: Effort normal and breath sounds normal. No respiratory distress. She has no wheezes. She has no rales.  Abdominal: Soft. There is no tenderness.  Neurological: She is alert.  Skin: Skin is warm and dry.  1cm area of induration and fluctuance L medial thigh consistent with abscess with 3-4 cm surrounding cellulitis.   Psychiatric: She has a normal mood and affect.  Nursing note and vitals reviewed.   ED Course  Procedures (including critical care time) Labs Review Labs Reviewed  BASIC METABOLIC PANEL - Abnormal; Notable for the following:    Glucose, Bld 319 (*)    All other components within normal limits  URINALYSIS, ROUTINE W REFLEX MICROSCOPIC - Abnormal; Notable for the following:    Specific Gravity, Urine 1.043 (*)    Glucose, UA >1000 (*)  All other components within normal limits  URINE MICROSCOPIC-ADD ON - Abnormal; Notable for the following:    Squamous Epithelial / LPF FEW (*)    Bacteria, UA FEW (*)    All other components within normal limits  CBG MONITORING, ED - Abnormal; Notable for the following:    Glucose-Capillary 306 (*)    All other components within normal limits  CBG MONITORING, ED - Abnormal; Notable for the following:    Glucose-Capillary 65 (*)    All other components within normal limits  CBC WITH DIFFERENTIAL  POC URINE PREG, ED  CBG MONITORING, ED    Imaging Review No results found.   EKG Interpretation None       12:12 PM Patient seen and examined. Work-up initiated. Medications ordered.   Vital signs reviewed and are as follows: BP 124/87 mmHg  Pulse 102  Temp(Src) 98.1 F (36.7 C) (Oral)  Resp 18  SpO2 97%  LMP 09/13/2014  2:22 PM Labs do not show DKA. She has received IV clindamycin. Patient initially agreed to proceed with I&D. I set everything up  and stuck the patient with the needle to inject lidocaine. At that point she decided she no longer wanted to proceed with I&D. She states that she was nauseous and became tearful. I withdrew needle and asked nurse to give IV Zofran. Upon re-entering 10 minutes later, patient states she wants the area drained but not here in the ED. She would like to follow-up with surgery.   I offered pain medication. She continues to decline. Will give additional fluids and plan on discharge.   4:25 PM Blood sugar now in normal range. Patient states that she took NovoLog and Lantus at approximately 10 AM. She is eating. She does not feel symptoms of hypoglycemia.  Will discharge to home with prescription for clindamycin. She has taken this in the past. I have encouraged her to follow-up with [place] surgery or her primary care physician for recheck in 2 days. She is invited to return if the area worsens or if she decides that she like [place] to drain the abscess.  She will continue to monitor her blood sugars at home closely.  MDM   Final diagnoses:  Hyperglycemia without ketosis  Abscess and cellulitis   Hyperglycemia: Treated with fluids. No insulin given in emergency department. Blood sugar improved into the normal range, suspect some and fluids from the Lantus that she took this morning. No symptoms of hypoglycemia.  Abscess and cellulitis: Clindamycin given by IV in ED. She will be discharged home on [place]. I&D started here, however patient changed her mind and is refusing drainage at this time. Patient counseled to do warm compresses and elevate the extremity at home, return with worsening, follow-up with surgery for drainage as soon as possible.    Carlisle Cater, PA-C 09/20/14 [place], MD 09/20/14 [phone]

## 2014-09-20 NOTE — ED Notes (Signed)
Suture cart, betadine and I and D tray at bedside with lidocaine

## 2014-09-20 NOTE — Discharge Instructions (Signed)
Please read and follow all provided instructions.  Your diagnoses today include:  1. Hyperglycemia without ketosis   2. Abscess and cellulitis    Tests performed today include:  Vital signs. See below for your results today.   Blood counts and electrolytes - shows elevated blood sugar, no signs of severe infection or DKA  Medications prescribed:   Clindamycin - antibiotic  You have been prescribed an antibiotic medicine: take the entire course of medicine even if you are feeling better. Stopping early can cause the antibiotic not to work.  Take any prescribed medications only as directed.   Home care instructions:   Follow any educational materials contained in this packet  Follow-up instructions: Return to the Emergency Department in 48 hours for a recheck if your symptoms are not significantly improved or return if you decide that you want the abscess drained.   Please follow-up with your primary care provider in the next 1 week for further evaluation of your symptoms.   Return instructions:  Return to the Emergency Department if you have:  Fever  Worsening symptoms  Worsening pain  Worsening swelling  Redness of the skin that moves away from the affected area, especially if it streaks away from the affected area   Any other emergent concerns   Your vital signs today were: BP 102/71 mmHg   Pulse 78   Temp(Src) 98.1 F (36.7 C) (Oral)   Resp 19   SpO2 100%   LMP 09/13/2014 If your blood pressure (BP) was elevated above 135/85 this visit, please have this repeated by your doctor within one month. --------------

## 2014-09-20 NOTE — ED Notes (Signed)
PA in room to perform I and D, PA inserted needle to numb site and patient stated "I feel nauseous take the needle out" needles was removed and zofran was given, and patient declined having the abscess treated here. She stated that she wanted [place] to do it because they have done it in the past.

## 2014-09-22 ENCOUNTER — Telehealth (INDEPENDENT_AMBULATORY_CARE_PROVIDER_SITE_OTHER): Payer: Self-pay | Admitting: Surgery

## 2014-09-22 ENCOUNTER — Inpatient Hospital Stay (HOSPITAL_COMMUNITY)
Admission: EM | Admit: 2014-09-22 | Discharge: 2014-09-25 | DRG: 572 | Disposition: A | Discharge status: Home / Self Care | Payer: 59 | Attending: Internal Medicine | Admitting: Internal Medicine

## 2014-09-22 ENCOUNTER — Encounter (INDEPENDENT_AMBULATORY_CARE_PROVIDER_SITE_OTHER): Payer: Self-pay | Admitting: Surgery

## 2014-09-22 ENCOUNTER — Encounter (HOSPITAL_COMMUNITY): Payer: Self-pay | Admitting: Emergency Medicine

## 2014-09-22 DIAGNOSIS — G43909 Migraine, unspecified, not intractable, without status migrainosus: Secondary | ICD-10-CM | POA: Diagnosis present

## 2014-09-22 DIAGNOSIS — E282 Polycystic ovarian syndrome: Secondary | ICD-10-CM | POA: Diagnosis present

## 2014-09-22 DIAGNOSIS — E109 Type 1 diabetes mellitus without complications: Secondary | ICD-10-CM | POA: Diagnosis present

## 2014-09-22 DIAGNOSIS — L03116 Cellulitis of left lower limb: Secondary | ICD-10-CM

## 2014-09-22 DIAGNOSIS — L02416 Cutaneous abscess of left lower limb: Secondary | ICD-10-CM | POA: Diagnosis present

## 2014-09-22 DIAGNOSIS — Z881 Allergy status to other antibiotic agents status: Secondary | ICD-10-CM

## 2014-09-22 DIAGNOSIS — L0390 Cellulitis, unspecified: Secondary | ICD-10-CM | POA: Diagnosis present

## 2014-09-22 DIAGNOSIS — M79652 Pain in left thigh: Secondary | ICD-10-CM | POA: Diagnosis not present

## 2014-09-22 DIAGNOSIS — Z91040 Latex allergy status: Secondary | ICD-10-CM

## 2014-09-22 DIAGNOSIS — Z9049 Acquired absence of other specified parts of digestive tract: Secondary | ICD-10-CM | POA: Diagnosis present

## 2014-09-22 DIAGNOSIS — F17210 Nicotine dependence, cigarettes, uncomplicated: Secondary | ICD-10-CM | POA: Diagnosis present

## 2014-09-22 DIAGNOSIS — J45909 Unspecified asthma, uncomplicated: Secondary | ICD-10-CM | POA: Diagnosis present

## 2014-09-22 DIAGNOSIS — E559 Vitamin D deficiency, unspecified: Secondary | ICD-10-CM | POA: Diagnosis present

## 2014-09-22 DIAGNOSIS — L732 Hidradenitis suppurativa: Secondary | ICD-10-CM | POA: Diagnosis not present

## 2014-09-22 DIAGNOSIS — E108 Type 1 diabetes mellitus with unspecified complications: Secondary | ICD-10-CM

## 2014-09-22 DIAGNOSIS — Z880 Allergy status to penicillin: Secondary | ICD-10-CM

## 2014-09-22 DIAGNOSIS — Z8614 Personal history of Methicillin resistant Staphylococcus aureus infection: Secondary | ICD-10-CM

## 2014-09-22 DIAGNOSIS — Z888 Allergy status to other drugs, medicaments and biological substances status: Secondary | ICD-10-CM

## 2014-09-22 DIAGNOSIS — F329 Major depressive disorder, single episode, unspecified: Secondary | ICD-10-CM | POA: Diagnosis present

## 2014-09-22 DIAGNOSIS — K219 Gastro-esophageal reflux disease without esophagitis: Secondary | ICD-10-CM | POA: Diagnosis present

## 2014-09-22 LAB — I-STAT CG4 LACTIC ACID, ED: LACTIC ACID, VENOUS: 0.59 mmol/L (ref 0.5–2.2)

## 2014-09-22 LAB — CBC WITH DIFFERENTIAL/PLATELET
BASOS PCT: 1 % (ref 0–1)
Basophils Absolute: 0 10*3/uL (ref 0.0–0.1)
EOS ABS: 0.2 10*3/uL (ref 0.0–0.7)
Eosinophils Relative: 3 % (ref 0–5)
HCT: 42.3 % (ref 36.0–46.0)
Hemoglobin: 14.8 g/dL (ref 12.0–15.0)
Lymphocytes Relative: 30 % (ref 12–46)
Lymphs Abs: 2.5 10*3/uL (ref 0.7–4.0)
MCH: 32 pg (ref 26.0–34.0)
MCHC: 35 g/dL (ref 30.0–36.0)
MCV: 91.6 fL (ref 78.0–100.0)
Monocytes Absolute: 0.6 10*3/uL (ref 0.1–1.0)
Monocytes Relative: 7 % (ref 3–12)
NEUTROS ABS: 5.1 10*3/uL (ref 1.7–7.7)
NEUTROS PCT: 59 % (ref 43–77)
Platelets: 305 10*3/uL (ref 150–400)
RBC: 4.62 MIL/uL (ref 3.87–5.11)
RDW: 12.4 % (ref 11.5–15.5)
WBC: 8.5 10*3/uL (ref 4.0–10.5)

## 2014-09-22 LAB — COMPREHENSIVE METABOLIC PANEL
ALT: 14 U/L (ref 0–35)
ANION GAP: 11 (ref 5–15)
AST: 14 U/L (ref 0–37)
Albumin: 3.6 g/dL (ref 3.5–5.2)
Alkaline Phosphatase: 86 U/L (ref 39–117)
BUN: 9 mg/dL (ref 6–23)
CHLORIDE: 102 meq/L (ref 96–112)
CO2: 22 mmol/L (ref 19–32)
Calcium: 9.6 mg/dL (ref 8.4–10.5)
Creatinine, Ser: 0.69 mg/dL (ref 0.50–1.10)
GFR calc non Af Amer: >90 mL/min (ref >90)
Glucose, Bld: 239 mg/dL — ABNORMAL HIGH (ref 70–99)
Potassium: 3.8 mmol/L (ref 3.5–5.1)
Sodium: 135 mmol/L (ref 135–145)
TOTAL PROTEIN: 6.5 g/dL (ref 6.0–8.3)
Total Bilirubin: 0.8 mg/dL (ref 0.3–1.2)

## 2014-09-22 LAB — URINALYSIS, ROUTINE W REFLEX MICROSCOPIC
Bilirubin Urine: NEGATIVE
Glucose, UA: >1000 mg/dL — AB
Ketones, ur: 15 mg/dL — AB
LEUKOCYTES UA: NEGATIVE
Nitrite: NEGATIVE
PH: 5.5 (ref 5.0–8.0)
Protein, ur: 30 mg/dL — AB
Specific Gravity, Urine: 1.031 — ABNORMAL HIGH (ref 1.005–1.030)
UROBILINOGEN UA: 0.2 mg/dL (ref 0.0–1.0)

## 2014-09-22 LAB — URINE MICROSCOPIC-ADD ON

## 2014-09-22 LAB — CREATININE, SERUM: Creatinine, Ser: 0.6 mg/dL (ref 0.50–1.10)

## 2014-09-22 LAB — CBC
HCT: 40.1 % (ref 36.0–46.0)
HEMOGLOBIN: 13.7 g/dL (ref 12.0–15.0)
MCH: 31.4 pg (ref 26.0–34.0)
MCHC: 34.2 g/dL (ref 30.0–36.0)
MCV: 92 fL (ref 78.0–100.0)
Platelets: 299 10*3/uL (ref 150–400)
RBC: 4.36 MIL/uL (ref 3.87–5.11)
RDW: 12.3 % (ref 11.5–15.5)
WBC: 8.7 10*3/uL (ref 4.0–10.5)

## 2014-09-22 LAB — PREGNANCY, URINE: Preg Test, Ur: NEGATIVE

## 2014-09-22 LAB — GLUCOSE, CAPILLARY
GLUCOSE-CAPILLARY: 175 mg/dL — AB (ref 70–99)
GLUCOSE-CAPILLARY: 218 mg/dL — AB (ref 70–99)

## 2014-09-22 MED ORDER — VANCOMYCIN HCL IN DEXTROSE 1-5 GM/200ML-% IV SOLN
1000.0000 mg | Freq: Once | INTRAVENOUS | Status: AC
  Administered 2014-09-22: 1000 mg via INTRAVENOUS
  Filled 2014-09-22: qty 200

## 2014-09-22 MED ORDER — ACETAMINOPHEN 650 MG RE SUPP: 650.0000 mg | Freq: Four times a day (QID) | RECTAL | Status: DC | PRN

## 2014-09-22 MED ORDER — SODIUM CHLORIDE 0.9 % IV BOLUS (SEPSIS)
1000.0000 mL | Freq: Once | INTRAVENOUS | Status: AC
  Administered 2014-09-22: 1000 mL via INTRAVENOUS

## 2014-09-22 MED ORDER — INSULIN ASPART 100 UNIT/ML ~~LOC~~ SOLN: 10.0000 [IU] | Freq: Three times a day (TID) | SUBCUTANEOUS | Status: DC

## 2014-09-22 MED ORDER — ONDANSETRON HCL 4 MG/2ML IJ SOLN
4.0000 mg | Freq: Once | INTRAMUSCULAR | Status: AC
  Administered 2014-09-22: 4 mg via INTRAVENOUS
  Filled 2014-09-22: qty 2

## 2014-09-22 MED ORDER — ONDANSETRON HCL 4 MG/2ML IJ SOLN: 4.0000 mg | Freq: Three times a day (TID) | INTRAMUSCULAR | Status: AC | PRN

## 2014-09-22 MED ORDER — INSULIN ASPART 100 UNIT/ML ~~LOC~~ SOLN
0.0000 [IU] | Freq: Three times a day (TID) | SUBCUTANEOUS | Status: DC
  Administered 2014-09-22: 3 [IU] via SUBCUTANEOUS
  Administered 2014-09-23: 8 [IU] via SUBCUTANEOUS
  Administered 2014-09-23: 11 [IU] via SUBCUTANEOUS
  Administered 2014-09-24: 8 [IU] via SUBCUTANEOUS
  Administered 2014-09-24: 3 [IU] via SUBCUTANEOUS
  Administered 2014-09-25 (×2): 2 [IU] via SUBCUTANEOUS

## 2014-09-22 MED ORDER — LEVONORGESTREL 20 MCG/24HR IU IUD: 1.0000 | INTRAUTERINE_SYSTEM | Freq: Once | INTRAUTERINE | Status: DC

## 2014-09-22 MED ORDER — SODIUM CHLORIDE 0.9 % IJ SOLN: 3.0000 mL | INTRAMUSCULAR | Status: DC | PRN

## 2014-09-22 MED ORDER — HYDROCODONE-ACETAMINOPHEN 5-325 MG PO TABS: 1.0000 | ORAL_TABLET | ORAL | Status: DC | PRN

## 2014-09-22 MED ORDER — ENOXAPARIN SODIUM 40 MG/0.4ML ~~LOC~~ SOLN
40.0000 mg | SUBCUTANEOUS | Status: DC
  Administered 2014-09-22 – 2014-09-24 (×2): 40 mg via SUBCUTANEOUS
  Filled 2014-09-22 (×4): qty 0.4

## 2014-09-22 MED ORDER — IBUPROFEN 800 MG PO TABS
800.0000 mg | ORAL_TABLET | Freq: Four times a day (QID) | ORAL | Status: DC | PRN
  Administered 2014-09-22 – 2014-09-23 (×2): 800 mg via ORAL
  Filled 2014-09-22 (×4): qty 1

## 2014-09-22 MED ORDER — HYDROCODONE-ACETAMINOPHEN 5-325 MG PO TABS
1.0000 | ORAL_TABLET | ORAL | Status: DC | PRN
  Administered 2014-09-22: 1 via ORAL
  Administered 2014-09-23 – 2014-09-25 (×8): 2 via ORAL
  Filled 2014-09-22 (×9): qty 2

## 2014-09-22 MED ORDER — ACETAMINOPHEN 325 MG PO TABS
650.0000 mg | ORAL_TABLET | Freq: Four times a day (QID) | ORAL | Status: DC | PRN
  Administered 2014-09-24: 650 mg via ORAL
  Filled 2014-09-22: qty 2

## 2014-09-22 MED ORDER — SODIUM CHLORIDE 0.9 % IV SOLN: 250.0000 mL | INTRAVENOUS | Status: DC | PRN

## 2014-09-22 MED ORDER — INSULIN GLARGINE 100 UNIT/ML ~~LOC~~ SOLN
50.0000 [IU] | Freq: Every morning | SUBCUTANEOUS | Status: DC
  Administered 2014-09-23: 50 [IU] via SUBCUTANEOUS
  Filled 2014-09-22: qty 0.5

## 2014-09-22 MED ORDER — HYDROCODONE-ACETAMINOPHEN 5-325 MG PO TABS
1.0000 | ORAL_TABLET | ORAL | Status: DC | PRN
  Administered 2014-09-22: 1 via ORAL
  Filled 2014-09-22: qty 1

## 2014-09-22 MED ORDER — SODIUM CHLORIDE 0.9 % IJ SOLN: 3.0000 mL | Freq: Two times a day (BID) | INTRAMUSCULAR | Status: DC

## 2014-09-22 MED ORDER — MORPHINE SULFATE 2 MG/ML IJ SOLN: 2.0000 mg | INTRAMUSCULAR | Status: DC | PRN

## 2014-09-22 NOTE — ED Notes (Signed)
Pt reports abscess to left inner thigh that she was seen for here and given antibiotics, however the antibiotics give her a headache so she has not been taking them and now her abscess has "doubled in size." Pt also reports nausea and dizziness.

## 2014-09-22 NOTE — Telephone Encounter (Signed)
Patient describes worsening cellulitis.  Seen in ER two days ago and given single dose of IV clindamycin.  Not tolerating oral abx.  Instructed patient to return to ER for assessment.  If worsening cellulitis, may need admission to medical service for IV abx.  Surgical service can evaluate if needed for I&D if fluctuent area identified.  Earnstine Regal, MD, [facility] Office: [phone]

## 2014-09-22 NOTE — ED Provider Notes (Signed)
CSN: [id number]     Arrival date & time 09/22/14  1300 History   First MD Initiated Contact with Patient 09/22/14 1321     Chief Complaint  Patient presents with  . Abscess     (Consider location/radiation/quality/duration/timing/severity/associated sxs/prior Treatment) The history is provided by the patient and medical records.     Pt with hx Type 1 DM, hydradenitis p/w worsening cellulitis of the left inner thigh.  The symptoms began 4 days ago.  She was seen in the ED on12/30 and offered I&D but she could not tolerate it.  Was sent home on clindamycin but only took 1 day's worth of medication then switched herself to doxycycline (she felt clindamycin worsened her migraines).  Last night she developed chills, nausea, and as of this morning the redness has doubled in size.  It is throbbing, 8/10 pain, radiates down her leg, is worse with walking.  She has had a dime size of white/yellow malodorous discharge from the wound.  She spoke with Dr Harlow Asa today, on call for [facility], and he encouraged her to come to the ED for further evaluation.    Past Medical History  Diagnosis Date  . Hx MRSA infection   . Migraine   . Vitamin D insufficiency   . Asthma     as a child  . GERD (gastroesophageal reflux disease)     no current meds.  . Hidradenitis 04/2012    bilat. thighs, left groin - open areas on thighs  . Diabetes mellitus     IDDM, Insulin pump; followed by Dr. [name]  . Abscess of left axilla - PREVOTELLA BIVIA & Staph Coag Neg 07/12/2012    MODERATE PREVOTELLA BIVIA Note: BETA LACTAMASE NEGATIVE    . PCOS (polycystic ovarian syndrome)   . Heart murmur   . Pneumonia     hx  . Depression     hx  . Cancer     skin tag rt breast  . Hydradenitis   . Cellulitis 06/01/2014    RT INNER THIGH   Past Surgical History  Procedure Laterality Date  . Wisdom tooth extraction    . Tonsillectomy    . Bunionectomy      left  . Esophagogastroduodenoscopy  11/17/2011     Procedure: ESOPHAGOGASTRODUODENOSCOPY (EGD);  Surgeon: [name], MD;  Location: [facility];  Service: Endoscopy;  Laterality: N/A;  . Eye surgery      exc. stye left eye  . Breast surgery      right lumpectomy  . Cholecystectomy  12/02/2006    lap. chole.  . Dilation and evacuation  02/14/2007  . Hydradenitis excision  04/30/2012    Procedure: EXCISION HYDRADENITIS GROIN;  Surgeon: [name], MD;  Location: [place];  Service: General;  Laterality: Left;  wide excision hidradenitis bilateral thighs and Left groin  . Hydradenitis excision Left 01/13/2014    Procedure: WIDE EXCISION HIDRADENITIS LEFT AXILLA;  Surgeon: [name], MD;  Location: [place];  Service: General;  Laterality: Left;  . Irrigation and debridement abscess Right 06/02/2014    Procedure: IRRIGATION AND DEBRIDEMENT ABSCESS;  Surgeon: [name], MD;  Location: [place];  Service: General;  Laterality: Right;  . Left heart catheterization with coronary angiogram N/A 07/14/2014    Procedure: LEFT HEART CATHETERIZATION WITH CORONARY ANGIOGRAM;  Surgeon: [name], MD;  Location: [facility] CATH LAB;  Service: Cardiovascular;  Laterality: N/A;   Family History  Problem Relation Age of  Onset  . Hyperlipidemia Sister   . Hypertension Sister   . Hypertension Father    History  Substance Use Topics  . Smoking status: Current Some Day Smoker -- 0.50 packs/day for 14 years    Types: Cigarettes  . Smokeless tobacco: Never Used     Comment: 5 cig./day  . Alcohol Use: No   OB History    Gravida Para Term Preterm AB TAB SAB Ectopic Multiple Living   4    3  3   1      Review of Systems  All other systems reviewed and are negative.     Allergies  Latex; Levofloxacin; Moxifloxacin; Oxycodone-acetaminophen; Peach; Penicillins; Potassium-containing compounds; Prednisone; Propoxyphene n-acetaminophen; Rosiglitazone maleate; Xolair; Adhesive; Morphine and related; Cefaclor; Keflex;  Promethazine hcl; and Sulfadiazine  Home Medications   Prior to Admission medications   Medication Sig Start Date End Date Taking? Authorizing Provider  clindamycin (CLEOCIN) 150 MG capsule Take 2 capsules (300 mg total) by mouth every 6 (six) hours. 09/20/14   Carlisle Cater, PA-C  clindamycin (CLINDAGEL) 1 % gel Apply 1 application topically 2 (two) times daily as needed (boil / rash).     Historical Provider, MD  doxycycline (VIBRAMYCIN) 50 MG capsule Take 50 mg by mouth daily as needed (for boil/rash).  05/02/14   Historical Provider, MD  HYDROcodone-acetaminophen (NORCO/VICODIN) 5-325 MG per tablet Take 1-2 tablets by mouth every 4 (four) hours as needed for moderate pain.    Historical Provider, MD  insulin aspart (NOVOLOG) 100 UNIT/ML injection Inject 10-21 Units into the skin 3 (three) times daily before meals.     Historical Provider, MD  insulin glargine (LANTUS) 100 UNIT/ML injection Inject 50-55 Units into the skin every morning.  05/10/13   Charlynne Cousins, MD  levonorgestrel (MIRENA) 20 MCG/24HR IUD 1 each by Intrauterine route once.    Historical Provider, MD  mupirocin cream (BACTROBAN) 2 % Apply 1 application topically daily as needed (for boil/rash).  05/26/14   Historical Provider, MD   BP 114/73 mmHg  Pulse 104  Temp(Src) 98.3 F (36.8 C) (Oral)  Resp 18  SpO2 100%  LMP 09/13/2014 Physical Exam  Constitutional: She appears well-developed and well-nourished. No distress.  HENT:  Head: Normocephalic and atraumatic.  Neck: Neck supple.  Pulmonary/Chest: Effort normal.  Neurological: She is alert.  Skin: She is not diaphoretic.     Irregularly shaped erythematous, warm patch with central scabbing.  No active discharge.  Measured 22 x 18cm.  Edges of erythema marked 2:10 PM   Nursing note and vitals reviewed.   ED Course  Procedures (including critical care time) Labs Review Labs Reviewed  COMPREHENSIVE METABOLIC PANEL - Abnormal; Notable for the following:     Glucose, Bld 239 (*)    All other components within normal limits  URINALYSIS, ROUTINE W REFLEX MICROSCOPIC - Abnormal; Notable for the following:    Specific Gravity, Urine 1.031 (*)    Glucose, UA >1000 (*)    Hgb urine dipstick TRACE (*)    Ketones, ur 15 (*)    Protein, ur 30 (*)    All other components within normal limits  URINE MICROSCOPIC-ADD ON - Abnormal; Notable for the following:    Squamous Epithelial / LPF MANY (*)    Bacteria, UA MANY (*)    All other components within normal limits  CULTURE, BLOOD (ROUTINE X 2)  CULTURE, BLOOD (ROUTINE X 2)  CBC WITH DIFFERENTIAL  PREGNANCY, URINE  CBC  CREATININE, SERUM  HIV  ANTIBODY (ROUTINE TESTING)  I-STAT CG4 LACTIC ACID, ED    Imaging Review No results found.   EKG Interpretation None       EMERGENCY DEPARTMENT US SOFT TISSUE INTERPRETATION "Study: Limited Ultrasound of the noted body part in comments below"  INDICATIONS: Soft tissue infection Multiple views of the body part are obtained with a multi-frequency linear probe  PERFORMED BY:  Myself  IMAGES ARCHIVED?: No  Computer memory unavailable.   SIDE:Left  BODY PART:Lower extremity  FINDINGS: Cellulitis present and Other small superficial fluid collection  LIMITATIONS:  Body Habitus  INTERPRETATION:  Abcess present and Cellulitis present  COMMENT:  See above.  Only small fluid collection present just under central opening   2:06 PM Pt declines I&D in the ED, states she is "still scared."   3:07 PM Discussed with and admitted to Dr Ree Kida, [facility].   MDM   Final diagnoses:  Cellulitis of left lower extremity    Afebrile nontoxic patient with Type 1 diabetes and hidraadentitis with progressing cellulitis with small fluid collection in left medial thigh.  Chills, nausea, uncontrolled blood sugars at home. Failed outpatient treatment with antibiotics.  Pt declined I&D in the ED.  Will do warm compresses and IV vancomycin.  Pt unable to  tolerate clindamycin and has multiple medication allergies.  Admitted for observation and IV antibiotic treatment.  [facility] to Admit.      Clayton Bibles, PA-C 09/22/14 [place], MD 09/22/14 [phone]

## 2014-09-22 NOTE — Progress Notes (Signed)
ANTIBIOTIC CONSULT NOTE - INITIAL  Pharmacy Consult for vancomycin Indication: cellulitis  Allergies  Allergen Reactions  . Latex Hives, Shortness Of Breath and Rash  . Levofloxacin Shortness Of Breath and Rash  . Moxifloxacin Shortness Of Breath and Rash  . Oxycodone-Acetaminophen Shortness Of Breath, Swelling and Rash    NORCO/VICODIN OK  . Peach [Prunus Persica] Hives and Shortness Of Breath  . Penicillins Swelling and Rash  . Potassium-Containing Compounds Other (See Comments)    IV ROUTE - CAUSES VEINS TO COLLAPSE  . Prednisone Other (See Comments)    SEVERE ELEVATION OF BLOOD SUGAR. Able to tolerate 40 mg  . Propoxyphene N-Acetaminophen Swelling    SWELLING OF FACE AND THROAT  . Rosiglitazone Maleate Swelling    SWELLING OF FACE AND LEGS  . Xolair [Omalizumab] Other (See Comments)    Rash and anaphylaxis  . Adhesive [Tape] Hives, Itching and Rash  . Morphine And Related Other (See Comments)    Causes hallucinations  . Cefaclor Rash  . Keflex [Cephalexin] Diarrhea and Rash  . Promethazine Hcl Other (See Comments)    IV ROUTE ONLY - JITTERY FEELING. Patient reports that it is mild and she has used promethazine since then  . Sulfadiazine Rash    Patient Measurements: Weight: 229 lb 11.2 oz (104.191 kg) Adjusted Body Weight:   Vital Signs: Temp: 98.3 F (36.8 C) (01/01 1312) Temp Source: Oral (01/01 1312) BP: 110/65 mmHg (01/01 1545) Pulse Rate: 84 (01/01 1545) Intake/Output from previous day:   Intake/Output from this shift:    Labs:  Recent Labs  09/20/14 1212 09/22/14 1319  WBC 8.2 8.5  HGB 14.9 14.8  PLT 314 305  CREATININE 0.64 0.69     Microbiology: No results found for this or any previous visit (from the past 720 hour(s)).  Medical History: Past Medical History  Diagnosis Date  . Hx MRSA infection   . Migraine   . Vitamin D insufficiency   . Asthma     as a child  . GERD (gastroesophageal reflux disease)     no current meds.  .  Hidradenitis 04/2012    bilat. thighs, left groin - open areas on thighs  . Diabetes mellitus     IDDM, Insulin pump; followed by Dr. [name]  . Abscess of left axilla - PREVOTELLA BIVIA & Staph Coag Neg 07/12/2012    MODERATE PREVOTELLA BIVIA Note: BETA LACTAMASE NEGATIVE    . PCOS (polycystic ovarian syndrome)   . Heart murmur   . Pneumonia     hx  . Depression     hx  . Cancer     skin tag rt breast  . Hydradenitis   . Cellulitis 06/01/2014    RT INNER THIGH    Medications:  Prescriptions prior to admission  Medication Sig Dispense Refill Last Dose  . clindamycin (CLINDAGEL) 1 % gel Apply 1 application topically 2 (two) times daily as needed (boil / rash).    Past Month at Unknown time  . doxycycline (VIBRAMYCIN) 50 MG capsule Take 50 mg by mouth daily as needed (for boil/rash).   0 Past Month at Unknown time  . HYDROcodone-acetaminophen (NORCO/VICODIN) 5-325 MG per tablet Take 1-2 tablets by mouth every 4 (four) hours as needed for moderate pain.   Past Month at Unknown time  . insulin aspart (NOVOLOG) 100 UNIT/ML injection Inject 10-21 Units into the skin 3 (three) times daily before meals.    09/22/2014 at Unknown time  . insulin glargine (LANTUS)  100 UNIT/ML injection Inject 50-55 Units into the skin every morning.    09/22/2014 at Unknown time  . levonorgestrel (MIRENA) 20 MCG/24HR IUD 1 each by Intrauterine route once.   09/22/2014 at Unknown time  . mupirocin cream (BACTROBAN) 2 % Apply 1 application topically daily as needed (for boil/rash).   1 Past Month at Unknown time  . clindamycin (CLEOCIN) 150 MG capsule Take 2 capsules (300 mg total) by mouth every 6 (six) hours. 56 capsule 0 09/21/2014   Assessment: 34 yo female admitted for cellulitis on L medial thigh. Pt was prescribed clindamycin and doxycyline as an outpatient but was not taking the medications. Rec'd vancomycin in ED, but due to age and body habitus will add another 1 g to complete load.   Goal of Therapy:   Vancomycin trough level 10-15 mcg/ml   Plan:  -Vancomycin 1 g IV q8h  -Check trough at steady state -Watch renal fx, blood cultures    Hughes Better, PharmD, BCPS Clinical Pharmacist Pager: [phone] 09/22/2014 5:24 PM

## 2014-09-22 NOTE — ED Notes (Signed)
Hospitalist at bedside 

## 2014-09-22 NOTE — ED Notes (Signed)
Pt here with abscess to left upper thigh; pt with hx of same; pt sts taking antibiotics but not improved; pt sts increased redness

## 2014-09-22 NOTE — H&P (Signed)
[facility] History and Physical  [name]:[id number] DOB: October 03, 1980 DOA: 09/22/2014  Referring physician:  PCP: Barbara Schwalbe, MD  Specialists:   Chief Complaint: Worsening Cellulitis  HPI: [name] is a 34 y.o. female with a history of type 1 diabetes mellitus , hidradenitis , that presented to the emergency department with complaints of worsening cellulitis. Patient had come to emergency department 2 days ago I was placed on clindamycin. She returned home and found the clindamycin worsened her migraine headaches. Patient had leftover doxycycline which she began to take yesterday however she noticed that her cellulitis had worsened. Patient also experienced some chills as well as nausea and had elevated blood sugars at home. Patient states that she seen some drainage from the area however does not want to have incision and drainage. Prior to coming in to the emergency department, patient contacted [name], general surgery, who recommended she go to emergency room.  [facility] was called for admission.  Review of Systems:  Constitutional: Denies fever, chills, diaphoresis, appetite change and fatigue.  HEENT: Denies photophobia, eye pain, redness, hearing loss, ear pain, congestion, sore throat, rhinorrhea, sneezing, mouth sores, trouble swallowing, neck pain, neck stiffness and tinnitus.   Respiratory: Denies SOB, DOE, cough, chest tightness,  and wheezing.   Cardiovascular: Denies chest pain, palpitations and leg swelling.  Gastrointestinal: Denies nausea, vomiting, abdominal pain, diarrhea, constipation, blood in stool and abdominal distention.  Genitourinary: Denies dysuria, urgency, frequency, hematuria, flank pain and difficulty urinating.  Musculoskeletal: Denies myalgias, back pain, joint swelling, arthralgias and gait problem.  Skin: Worsening cellulitis of the left thigh Neurological: Denies dizziness, seizures, syncope, weakness, light-headedness, numbness and  headaches.  Hematological: Denies adenopathy. Easy bruising, personal or family bleeding history  Psychiatric/Behavioral: Denies suicidal ideation, mood changes, confusion, nervousness, sleep disturbance and agitation  Past Medical History  Diagnosis Date  . Hx MRSA infection   . Migraine   . Vitamin D insufficiency   . Asthma     as a child  . GERD (gastroesophageal reflux disease)     no current meds.  . Hidradenitis 04/2012    bilat. thighs, left groin - open areas on thighs  . Diabetes mellitus     IDDM, Insulin pump; followed by [name]  . Abscess of left axilla - PREVOTELLA BIVIA & Staph Coag Neg 07/12/2012    MODERATE PREVOTELLA BIVIA Note: BETA LACTAMASE NEGATIVE    . PCOS (polycystic ovarian syndrome)   . Heart murmur   . Pneumonia     hx  . Depression     hx  . Cancer     skin tag rt breast  . Hydradenitis   . Cellulitis 06/01/2014    RT INNER THIGH   Past Surgical History  Procedure Laterality Date  . Wisdom tooth extraction    . Tonsillectomy    . Bunionectomy      left  . Esophagogastroduodenoscopy  11/17/2011    Procedure: ESOPHAGOGASTRODUODENOSCOPY (EGD);  Surgeon: Barbara Ng, MD;  Location: [facility];  Service: Endoscopy;  Laterality: N/A;  . Eye surgery      exc. stye left eye  . Breast surgery      right lumpectomy  . Cholecystectomy  12/02/2006    lap. chole.  . Dilation and evacuation  02/14/2007  . Hydradenitis excision  04/30/2012    Procedure: EXCISION HYDRADENITIS GROIN;  Surgeon: Barbara Bowie, MD;  Location: [place];  Service: General;  Laterality: Left;  wide excision  hidradenitis bilateral thighs and Left groin  . Hydradenitis excision Left 01/13/2014    Procedure: WIDE EXCISION HIDRADENITIS LEFT AXILLA;  Surgeon: Barbara Bowie, MD;  Location: [place];  Service: General;  Laterality: Left;  . Irrigation and debridement abscess Right 06/02/2014    Procedure: IRRIGATION AND DEBRIDEMENT ABSCESS;   Surgeon: Barbara Keens, MD;  Location: [place];  Service: General;  Laterality: Right;  . Left heart catheterization with coronary angiogram N/A 07/14/2014    Procedure: LEFT HEART CATHETERIZATION WITH CORONARY ANGIOGRAM;  Surgeon: Barbara M Martinique, MD;  Location: [facility] CATH LAB;  Service: Cardiovascular;  Laterality: N/A;   Social History:  reports that she has been smoking Cigarettes.  She has a 7 pack-year smoking history. She has never used smokeless tobacco. She reports that she does not drink alcohol or use illicit drugs.   Allergies  Allergen Reactions  . Latex Hives, Shortness Of Breath and Rash  . Levofloxacin Shortness Of Breath and Rash  . Moxifloxacin Shortness Of Breath and Rash  . Oxycodone-Acetaminophen Shortness Of Breath, Swelling and Rash    NORCO/VICODIN OK  . Peach [Prunus Persica] Hives and Shortness Of Breath  . Penicillins Swelling and Rash  . Potassium-Containing Compounds Other (See Comments)    IV ROUTE - CAUSES VEINS TO COLLAPSE  . Prednisone Other (See Comments)    SEVERE ELEVATION OF BLOOD SUGAR. Able to tolerate 40 mg  . Propoxyphene N-Acetaminophen Swelling    SWELLING OF FACE AND THROAT  . Rosiglitazone Maleate Swelling    SWELLING OF FACE AND LEGS  . Xolair [Omalizumab] Other (See Comments)    Rash and anaphylaxis  . Adhesive [Tape] Hives, Itching and Rash  . Morphine And Related Other (See Comments)    Causes hallucinations  . Cefaclor Rash  . Keflex [Cephalexin] Diarrhea and Rash  . Promethazine Hcl Other (See Comments)    IV ROUTE ONLY - JITTERY FEELING. Patient reports that it is mild and she has used promethazine since then  . Sulfadiazine Rash    Family History  Problem Relation Age of Onset  . Hyperlipidemia Sister   . Hypertension Sister   . Hypertension Father     Prior to Admission medications   Medication Sig Start Date End Date Taking? Authorizing Provider  clindamycin (CLEOCIN) 150 MG capsule Take 2 capsules (300 mg total) by  mouth every 6 (six) hours. 09/20/14   Barbara Cater, PA-C  clindamycin (CLINDAGEL) 1 % gel Apply 1 application topically 2 (two) times daily as needed (boil / rash).     Historical Provider, MD  doxycycline (VIBRAMYCIN) 50 MG capsule Take 50 mg by mouth daily as needed (for boil/rash).  05/02/14   Historical Provider, MD  HYDROcodone-acetaminophen (NORCO/VICODIN) 5-325 MG per tablet Take 1-2 tablets by mouth every 4 (four) hours as needed for moderate pain.    Historical Provider, MD  insulin aspart (NOVOLOG) 100 UNIT/ML injection Inject 10-21 Units into the skin 3 (three) times daily before meals.     Historical Provider, MD  insulin glargine (LANTUS) 100 UNIT/ML injection Inject 50-55 Units into the skin every morning.  05/10/13   Charlynne Cousins, MD  levonorgestrel (MIRENA) 20 MCG/24HR IUD 1 each by Intrauterine route once.    Historical Provider, MD  mupirocin cream (BACTROBAN) 2 % Apply 1 application topically daily as needed (for boil/rash).  05/26/14   Historical Provider, MD   Physical Exam: Filed Vitals:   09/22/14 1500  BP: 112/69  Pulse: 90  Temp:   Resp:  General: Well developed, well nourished, NAD, appears stated age  HEENT: NCAT, PERRLA, EOMI, Anicteic Sclera, mucous membranes moist.   Neck: Supple, no JVD, no masses  Cardiovascular: S1 S2 auscultated, no rubs, murmurs or gallops. Regular rate and rhythm.  Respiratory: Clear to auscultation bilaterally with equal chest rise  Abdomen: Soft, nontender, nondistended, + bowel sounds  Extremities: warm dry without cyanosis clubbing or edema, left inner thigh-erythema and warm, tender to touch, 22x18cm  Neuro: AAOx3, cranial nerves grossly intact. Strength 5/5 in patient's upper and lower extremities bilaterally  Skin: Left inner thigh erythema, warmth  Psych: Normal affect and demeanor with intact judgement and insight  Labs on Admission:  Basic Metabolic Panel:  Recent Labs Lab 09/20/14 1212 09/22/14 1319    NA 135 135  K 3.7 3.8  CL 105 102  CO2 23 22  GLUCOSE 319* 239*  BUN 9 9  CREATININE 0.64 0.69  CALCIUM 9.7 9.6   Liver Function Tests:  Recent Labs Lab 09/22/14 1319  AST 14  ALT 14  ALKPHOS 86  BILITOT 0.8  PROT 6.5  ALBUMIN 3.6   No results for input(s): LIPASE, AMYLASE in the last 168 hours. No results for input(s): AMMONIA in the last 168 hours. CBC:  Recent Labs Lab 09/20/14 1212 09/22/14 1319  WBC 8.2 8.5  NEUTROABS 4.8 5.1  HGB 14.9 14.8  HCT 43.4 42.3  MCV 90.4 91.6  PLT 314 305   Cardiac Enzymes: No results for input(s): CKTOTAL, CKMB, CKMBINDEX, TROPONINI in the last 168 hours.  BNP (last 3 results)  Recent Labs  06/28/14 1021  PROBNP 68.4   CBG:  Recent Labs Lab 09/20/14 1201 09/20/14 1601 09/20/14 1606  GLUCAP 306* 65* 71    Radiological Exams on Admission: No results found.  EKG: None  Assessment/Plan  Left thigh cellulitis -Patient will be admitted to the medical floor. -Patient failed outpatient treatment with clindamycin as well as doxycycline. -Will place patient on vancomycin -Will apply warm compresses -area has been outlined -Small amount of purulent discharge -Patient does not want an I&D -Provide pain control.  Diabetes Mellitus, Type 1 -Continue home regimen, with ISS and CBG monitoring -Last HbA1c 10.2 (06/01/2014)  PCOS -Continue levonorgestrel  DVT prophylaxis: Lovenox  Code Status: Full  Condition:  guarded  Family Communication:  Family of bedside. Admission, patients condition and plan of care including tests being ordered have been discussed with the patient and  family who indicate understanding and agree with the plan and Code Status.  Disposition Plan:  Admitted for observation   Time spent: 50 minutes  Fayola Meckes D.O. [facility] Pager [phone]  If 7PM-7AM, please contact night-coverage [email or web address] Password [facility] 09/22/2014, 3:33 PM

## 2014-09-22 NOTE — ED Notes (Signed)
Attempted bedside report. RN unable to understand me. Taking pt up for bedside report.

## 2014-09-23 ENCOUNTER — Encounter (HOSPITAL_COMMUNITY): Payer: Self-pay | Admitting: General Practice

## 2014-09-23 ENCOUNTER — Inpatient Hospital Stay (HOSPITAL_COMMUNITY): Payer: 59 | Admitting: Anesthesiology

## 2014-09-23 ENCOUNTER — Encounter (HOSPITAL_COMMUNITY)
Admission: EM | Disposition: A | Discharge status: Home / Self Care | Payer: Self-pay | Source: Home / Self Care | Attending: Internal Medicine

## 2014-09-23 DIAGNOSIS — E109 Type 1 diabetes mellitus without complications: Secondary | ICD-10-CM | POA: Diagnosis present

## 2014-09-23 DIAGNOSIS — G43909 Migraine, unspecified, not intractable, without status migrainosus: Secondary | ICD-10-CM | POA: Diagnosis present

## 2014-09-23 DIAGNOSIS — Z888 Allergy status to other drugs, medicaments and biological substances status: Secondary | ICD-10-CM | POA: Diagnosis not present

## 2014-09-23 DIAGNOSIS — L03116 Cellulitis of left lower limb: Secondary | ICD-10-CM

## 2014-09-23 DIAGNOSIS — L732 Hidradenitis suppurativa: Secondary | ICD-10-CM | POA: Diagnosis present

## 2014-09-23 DIAGNOSIS — E559 Vitamin D deficiency, unspecified: Secondary | ICD-10-CM | POA: Diagnosis present

## 2014-09-23 DIAGNOSIS — Z9049 Acquired absence of other specified parts of digestive tract: Secondary | ICD-10-CM | POA: Diagnosis present

## 2014-09-23 DIAGNOSIS — F329 Major depressive disorder, single episode, unspecified: Secondary | ICD-10-CM | POA: Diagnosis present

## 2014-09-23 DIAGNOSIS — E282 Polycystic ovarian syndrome: Secondary | ICD-10-CM | POA: Diagnosis present

## 2014-09-23 DIAGNOSIS — Z91040 Latex allergy status: Secondary | ICD-10-CM | POA: Diagnosis not present

## 2014-09-23 DIAGNOSIS — Z8614 Personal history of Methicillin resistant Staphylococcus aureus infection: Secondary | ICD-10-CM | POA: Diagnosis not present

## 2014-09-23 DIAGNOSIS — Z881 Allergy status to other antibiotic agents status: Secondary | ICD-10-CM | POA: Diagnosis not present

## 2014-09-23 DIAGNOSIS — K219 Gastro-esophageal reflux disease without esophagitis: Secondary | ICD-10-CM | POA: Diagnosis present

## 2014-09-23 DIAGNOSIS — E1065 Type 1 diabetes mellitus with hyperglycemia: Secondary | ICD-10-CM

## 2014-09-23 DIAGNOSIS — Z880 Allergy status to penicillin: Secondary | ICD-10-CM | POA: Diagnosis not present

## 2014-09-23 DIAGNOSIS — M79652 Pain in left thigh: Secondary | ICD-10-CM | POA: Diagnosis present

## 2014-09-23 DIAGNOSIS — L02416 Cutaneous abscess of left lower limb: Secondary | ICD-10-CM | POA: Diagnosis present

## 2014-09-23 DIAGNOSIS — J45909 Unspecified asthma, uncomplicated: Secondary | ICD-10-CM | POA: Diagnosis present

## 2014-09-23 DIAGNOSIS — F17210 Nicotine dependence, cigarettes, uncomplicated: Secondary | ICD-10-CM | POA: Diagnosis present

## 2014-09-23 HISTORY — PX: IRRIGATION AND DEBRIDEMENT ABSCESS: SHX5252

## 2014-09-23 LAB — GLUCOSE, CAPILLARY
GLUCOSE-CAPILLARY: 256 mg/dL — AB (ref 70–99)
GLUCOSE-CAPILLARY: 58 mg/dL — AB (ref 70–99)
GLUCOSE-CAPILLARY: 70 mg/dL (ref 70–99)
GLUCOSE-CAPILLARY: 75 mg/dL (ref 70–99)
GLUCOSE-CAPILLARY: 255 mg/dL — AB (ref 70–99)
Glucose-Capillary: 189 mg/dL — ABNORMAL HIGH (ref 70–99)
Glucose-Capillary: 158 mg/dL — ABNORMAL HIGH (ref 70–99)
Glucose-Capillary: 244 mg/dL — ABNORMAL HIGH (ref 70–99)
Glucose-Capillary: 328 mg/dL — ABNORMAL HIGH (ref 70–99)
Glucose-Capillary: 219 mg/dL — ABNORMAL HIGH (ref 70–99)
Glucose-Capillary: 74 mg/dL (ref 70–99)

## 2014-09-23 SURGERY — IRRIGATION AND DEBRIDEMENT ABSCESS
Anesthesia: General | Laterality: Left

## 2014-09-23 MED ORDER — MIDAZOLAM HCL 2 MG/2ML IJ SOLN
INTRAMUSCULAR | Status: AC
  Filled 2014-09-23: qty 2

## 2014-09-23 MED ORDER — PROPOFOL 10 MG/ML IV BOLUS
INTRAVENOUS | Status: DC | PRN
  Administered 2014-09-23: 180 mg via INTRAVENOUS

## 2014-09-23 MED ORDER — ARTIFICIAL TEARS OP OINT
TOPICAL_OINTMENT | OPHTHALMIC | Status: DC | PRN
  Administered 2014-09-23: 1 via OPHTHALMIC

## 2014-09-23 MED ORDER — HYDROMORPHONE HCL 1 MG/ML IJ SOLN
INTRAMUSCULAR | Status: AC
  Filled 2014-09-23: qty 1

## 2014-09-23 MED ORDER — FENTANYL CITRATE 0.05 MG/ML IJ SOLN
INTRAMUSCULAR | Status: AC
  Filled 2014-09-23: qty 5

## 2014-09-23 MED ORDER — HYDROMORPHONE HCL 1 MG/ML IJ SOLN
0.2500 mg | INTRAMUSCULAR | Status: DC | PRN
  Administered 2014-09-23 (×4): 0.5 mg via INTRAVENOUS

## 2014-09-23 MED ORDER — INSULIN ASPART 100 UNIT/ML ~~LOC~~ SOLN
5.0000 [IU] | Freq: Once | SUBCUTANEOUS | Status: AC
  Administered 2014-09-23: 5 [IU] via SUBCUTANEOUS

## 2014-09-23 MED ORDER — LIDOCAINE HCL (CARDIAC) 20 MG/ML IV SOLN
INTRAVENOUS | Status: DC | PRN
  Administered 2014-09-23: 100 mg via INTRAVENOUS

## 2014-09-23 MED ORDER — MIDAZOLAM HCL 5 MG/5ML IJ SOLN
INTRAMUSCULAR | Status: DC | PRN
  Administered 2014-09-23: 2 mg via INTRAVENOUS

## 2014-09-23 MED ORDER — ONDANSETRON HCL 4 MG/2ML IJ SOLN
INTRAMUSCULAR | Status: DC | PRN
  Administered 2014-09-23: 4 mg via INTRAVENOUS

## 2014-09-23 MED ORDER — SODIUM CHLORIDE 0.9 % IV SOLN
INTRAVENOUS | Status: DC
  Administered 2014-09-23: 12:00:00 via INTRAVENOUS

## 2014-09-23 MED ORDER — PROMETHAZINE HCL 25 MG/ML IJ SOLN: 6.2500 mg | INTRAMUSCULAR | Status: DC | PRN

## 2014-09-23 MED ORDER — LACTATED RINGERS IV SOLN
INTRAVENOUS | Status: DC
  Administered 2014-09-23: 14:00:00 via INTRAVENOUS

## 2014-09-23 MED ORDER — FENTANYL CITRATE 0.05 MG/ML IJ SOLN
INTRAMUSCULAR | Status: DC | PRN
  Administered 2014-09-23 (×2): 50 ug via INTRAVENOUS

## 2014-09-23 MED ORDER — SUCCINYLCHOLINE CHLORIDE 20 MG/ML IJ SOLN
INTRAMUSCULAR | Status: DC | PRN
  Administered 2014-09-23: 100 mg via INTRAVENOUS

## 2014-09-23 MED ORDER — LACTATED RINGERS IV SOLN
INTRAVENOUS | Status: DC | PRN
  Administered 2014-09-23: 15:00:00 via INTRAVENOUS

## 2014-09-23 MED ORDER — VANCOMYCIN HCL IN DEXTROSE 1-5 GM/200ML-% IV SOLN
1000.0000 mg | Freq: Three times a day (TID) | INTRAVENOUS | Status: DC
  Administered 2014-09-23 – 2014-09-25 (×6): 1000 mg via INTRAVENOUS
  Filled 2014-09-23 (×9): qty 200

## 2014-09-23 MED ORDER — INSULIN GLARGINE 100 UNIT/ML ~~LOC~~ SOLN
50.0000 [IU] | SUBCUTANEOUS | Status: DC
  Administered 2014-09-24 – 2014-09-25 (×2): 50 [IU] via SUBCUTANEOUS
  Filled 2014-09-23 (×3): qty 0.5

## 2014-09-23 MED ORDER — DEXTROSE 5 % IV SOLN
2.0000 g | Freq: Three times a day (TID) | INTRAVENOUS | Status: DC
  Administered 2014-09-23 – 2014-09-25 (×7): 2 g via INTRAVENOUS
  Filled 2014-09-23 (×9): qty 2

## 2014-09-23 SURGICAL SUPPLY — 27 items
BNDG GAUZE ELAST 4 BULKY (GAUZE/BANDAGES/DRESSINGS) ×1 IMPLANT
CANISTER SUCTION 2500CC (MISCELLANEOUS) ×2 IMPLANT
COVER SURGICAL LIGHT HANDLE (MISCELLANEOUS) ×2 IMPLANT
DRAPE LAPAROSCOPIC ABDOMINAL (DRAPES) ×2 IMPLANT
DRAPE UTILITY XL STRL (DRAPES) ×4 IMPLANT
DRSG PAD ABDOMINAL 8X10 ST (GAUZE/BANDAGES/DRESSINGS) ×2 IMPLANT
ELECT CAUTERY BLADE 6.4 (BLADE) ×2 IMPLANT
ELECT REM PT RETURN 9FT ADLT (ELECTROSURGICAL) ×2
ELECTRODE REM PT RTRN 9FT ADLT (ELECTROSURGICAL) ×1 IMPLANT
GAUZE SPONGE 4X4 12PLY STRL (GAUZE/BANDAGES/DRESSINGS) ×2 IMPLANT
GLOVE BIO SURGEON STRL SZ8 (GLOVE) ×2 IMPLANT
GLOVE BIOGEL PI IND STRL 8 (GLOVE) ×1 IMPLANT
GLOVE BIOGEL PI INDICATOR 8 (GLOVE) ×1
GOWN STRL REUS W/ TWL LRG LVL3 (GOWN DISPOSABLE) ×1 IMPLANT
GOWN STRL REUS W/ TWL XL LVL3 (GOWN DISPOSABLE) ×1 IMPLANT
GOWN STRL REUS W/TWL LRG LVL3 (GOWN DISPOSABLE) ×2
GOWN STRL REUS W/TWL XL LVL3 (GOWN DISPOSABLE) ×2
KIT BASIN OR (CUSTOM PROCEDURE TRAY) ×2 IMPLANT
KIT ROOM TURNOVER OR (KITS) ×2 IMPLANT
NS IRRIG 1000ML POUR BTL (IV SOLUTION) ×2 IMPLANT
PACK GENERAL/GYN (CUSTOM PROCEDURE TRAY) ×2 IMPLANT
PACK UNIVERSAL I (CUSTOM PROCEDURE TRAY) ×1 IMPLANT
PAD ARMBOARD 7.5X6 YLW CONV (MISCELLANEOUS) ×2 IMPLANT
SWAB COLLECTION DEVICE MRSA (MISCELLANEOUS) IMPLANT
TOWEL OR 17X24 6PK STRL BLUE (TOWEL DISPOSABLE) ×2 IMPLANT
TOWEL OR 17X26 10 PK STRL BLUE (TOWEL DISPOSABLE) ×2 IMPLANT
TUBE ANAEROBIC SPECIMEN COL (MISCELLANEOUS) IMPLANT

## 2014-09-23 NOTE — Op Note (Signed)
09/22/2014 - 09/23/2014  3:44 PM  PATIENT:  [name]  34 y.o. female  PRE-OPERATIVE DIAGNOSIS:  Infected Left Thigh hidradenitis  POST-OPERATIVE DIAGNOSIS:  Infected Left Thigh hidradenitis  PROCEDURE:  Procedure(s): IRRIGATION DRAINAGE AND DEBRIDEMENT ABSCESS/LEFT THIGH  SURGEON:  Surgeon(s): [name], MD  ASSISTANTS: none   ANESTHESIA:   general  EBL:     BLOOD ADMINISTERED:none  DRAINS: none   SPECIMEN:  Excision  DISPOSITION OF SPECIMEN:  PATHOLOGY  COUNTS:  YES  DICTATION: .Dragon Dictation [name] is brought for incision and drainage of infected left medial thigh hidradenitis. She was identified in the preop holding area. Her site was marked & informed consent was obtained. She is on IV antibiotic protocol. She is brought to the operating room and general endotracheal anesthesia was administered by the anesthesia staff. She is placed in frog-leg position and left medial thigh was prepped and draped in sterile fashion. We did time out procedure. The central area of the abscess with purulent drainage was identified. Elliptical incision was made using cautery and this area was excised. Cultures were taken of the purulent drainage. Closer inspection and palpation of the surrounding tissues revealed several more nodules of infected appearing hidradenitis. We continued the debridement until all of these areas were removed. There were sent to pathology. Hemostasis was obtained with cautery. No further areas of active infection were identified. Wound was copiously irrigated and then packed with 0 Kerlix Kling wet-to-dry dressing. All counts were correct. Patient tolerated procedure well without apparent complication and taken recovery in stable condition.  PATIENT DISPOSITION:  PACU - hemodynamically stable.   Delay start of Pharmacological VTE agent (>24hrs) due to surgical blood loss or risk of bleeding:  no  [name], MD, MPH, FACS Pager: [phone]   1/2/20163:44 PM

## 2014-09-23 NOTE — Anesthesia Procedure Notes (Signed)
Procedure Name: Intubation Date/Time: 09/23/2014 3:16 PM Performed by: Ned Grace Pre-anesthesia Checklist: Patient identified, Timeout performed, Emergency Drugs available, Suction available and Patient being monitored Patient Re-evaluated:Patient Re-evaluated prior to inductionOxygen Delivery Method: Circle system utilized Preoxygenation: Pre-oxygenation with 100% oxygen Intubation Type: IV induction Ventilation: Mask ventilation without difficulty Laryngoscope Size: Mac and 3 Grade View: Grade I Tube type: Oral Tube size: 7.0 mm Number of attempts: 1 Airway Equipment and Method: Stylet Placement Confirmation: ETT inserted through vocal cords under direct vision,  breath sounds checked- equal and bilateral and positive ETCO2 Secured at: 23 cm Tube secured with: Tape Dental Injury: Teeth and Oropharynx as per pre-operative assessment

## 2014-09-23 NOTE — Progress Notes (Signed)
Prior to patient coming down for surgery Dr. Massage notified that patient had finished eating breakfast at 08:00

## 2014-09-23 NOTE — Progress Notes (Signed)
ANTIBIOTIC CONSULT NOTE - INITIAL  Pharmacy Consult for Vancomycin/Aztreonam Indication: cellulitis  Allergies  Allergen Reactions  . Latex Hives, Shortness Of Breath and Rash  . Levofloxacin Shortness Of Breath and Rash  . Moxifloxacin Shortness Of Breath and Rash  . Oxycodone-Acetaminophen Shortness Of Breath, Swelling and Rash    NORCO/VICODIN OK  . Peach [Prunus Persica] Hives and Shortness Of Breath  . Penicillins Swelling and Rash  . Potassium-Containing Compounds Other (See Comments)    IV ROUTE - CAUSES VEINS TO COLLAPSE  . Prednisone Other (See Comments)    SEVERE ELEVATION OF BLOOD SUGAR. Able to tolerate 40 mg  . Propoxyphene N-Acetaminophen Swelling    SWELLING OF FACE AND THROAT  . Rosiglitazone Maleate Swelling    SWELLING OF FACE AND LEGS  . Xolair [Omalizumab] Other (See Comments)    Rash and anaphylaxis  . Adhesive [Tape] Hives, Itching and Rash  . Morphine And Related Other (See Comments)    Causes hallucinations  . Cefaclor Rash  . Keflex [Cephalexin] Diarrhea and Rash  . Promethazine Hcl Other (See Comments)    IV ROUTE ONLY - JITTERY FEELING. Patient reports that it is mild and she has used promethazine since then  . Sulfadiazine Rash    Patient Measurements: Weight: 229 lb 11.2 oz (104.191 kg)  Vital Signs: Temp: 97.6 F (36.4 C) (01/02 0537) Temp Source: Oral (01/02 0537) BP: 95/56 mmHg (01/02 0537) Pulse Rate: 73 (01/02 0537) Intake/Output from previous day:   Intake/Output from this shift:    Labs:  Recent Labs  09/20/14 1212 09/22/14 1319 09/22/14 1716  WBC 8.2 8.5 8.7  HGB 14.9 14.8 13.7  PLT 314 305 299  CREATININE 0.64 0.69 0.60   Estimated Creatinine Clearance: 119.8 mL/min (by C-G formula based on Cr of 0.6). No results for input(s): VANCOTROUGH, VANCOPEAK, VANCORANDOM, GENTTROUGH, GENTPEAK, GENTRANDOM, TOBRATROUGH, TOBRAPEAK, TOBRARND, AMIKACINPEAK, AMIKACINTROU, AMIKACIN in the last 72 hours.   Microbiology: No results  found for this or any previous visit (from the past 720 hour(s)).  Medical History: Past Medical History  Diagnosis Date  . Hx MRSA infection   . Migraine   . Vitamin D insufficiency   . Asthma     as a child  . GERD (gastroesophageal reflux disease)     no current meds.  . Hidradenitis 04/2012    bilat. thighs, left groin - open areas on thighs  . Diabetes mellitus     IDDM, Insulin pump; followed by Dr. [name]  . Abscess of left axilla - PREVOTELLA BIVIA & Staph Coag Neg 07/12/2012    MODERATE PREVOTELLA BIVIA Note: BETA LACTAMASE NEGATIVE    . PCOS (polycystic ovarian syndrome)   . Heart murmur   . Pneumonia     hx  . Depression     hx  . Cancer     skin tag rt breast  . Hydradenitis   . Cellulitis 06/01/2014    RT INNER THIGH    Medications:  Scheduled:  . aztreonam  2 g Intravenous Q8H  . enoxaparin (LOVENOX) injection  40 mg Subcutaneous Q24H  . insulin aspart  0-15 Units Subcutaneous TID WC  . insulin glargine  50 Units Subcutaneous q morning - 10a  . sodium chloride  3 mL Intravenous Q12H  . vancomycin  1,000 mg Intravenous Q8H   Assessment: 34 yo f admitted on 1/1 for worsening cellulitis.  Pt has a h/o hidrandenitis and was recently on oral clindamycin and doxycycline.  Pt had drainage a  few days before presentation and is noted with some purulent discharge upon admission.  Patient was started on vancomycin yesterday but maintenance dose was not entered by evening shift pharmacist.  Pharmacy is consulted to also begin aztreonam this AM (PCN allergy) for additional gram negative coverage. Wbc wnl, afebrile, SCr 0.60, CrCl > 100 ml/min.  Vancomcyin 1/1 >> Aztreonam 1/2 >>  1/1 Bld Cx:  Goal of Therapy:  Vancomycin trough level 10-15 mcg/ml  Plan:  Vancomycin 1 gm IV q8h Aztreonam 2 gm IV q8h VT at Css since pt > 100 kg F/u after surgery Monitor renal fx, CBC, temperature curve, clinical course  Cassie L. Nicole Kindred, PharmD Clinical Pharmacy  Resident Pager: [phone] 09/23/2014 10:18 AM

## 2014-09-23 NOTE — Progress Notes (Signed)
Pt CBG 57. Awake alert, able to take juice and crackers. Pt received full coverage at 1200.

## 2014-09-23 NOTE — Progress Notes (Signed)
Pt CBG up to 75 after juice and peanut butter and crackers.Tolerating POs well  Dr. Orene Desanctis aware and no further orders at present

## 2014-09-23 NOTE — Anesthesia Preprocedure Evaluation (Signed)
Anesthesia Evaluation  Patient identified by MRN, date of birth, ID band Patient awake    Reviewed: Allergy & Precautions, H&P , NPO status , Patient's Chart, lab work & pertinent test results, reviewed documented beta blocker date and time   Airway Mallampati: II  TM Distance: >3 FB     Dental  (+) Teeth Intact, Dental Advisory Given   Pulmonary asthma , pneumonia -, resolved, Current Smoker,  breath sounds clear to auscultation        Cardiovascular Rhythm:Regular Rate:Normal     Neuro/Psych  Headaches,    GI/Hepatic Neg liver ROS, GERD-  Medicated and Controlled,  Endo/Other  diabetes, Well Controlled, Type 1, Insulin Dependent  Renal/GU      Musculoskeletal   Abdominal   Peds  Hematology negative hematology ROS (+)   Anesthesia Other Findings   Reproductive/Obstetrics negative OB ROS                             Anesthesia Physical Anesthesia Plan  ASA: II  Anesthesia Plan: General LMA and General   Post-op Pain Management:    Induction: Intravenous  Airway Management Planned: Oral ETT  Additional Equipment:   Intra-op Plan:   Post-operative Plan: Extubation in OR  Informed Consent:   Dental advisory given  Plan Discussed with: CRNA and Surgeon  Anesthesia Plan Comments:         Anesthesia Quick Evaluation

## 2014-09-23 NOTE — Progress Notes (Signed)
UR completed 

## 2014-09-23 NOTE — Progress Notes (Signed)
PATIENT DETAILS Name: [name] Age: 34 y.o. Sex: female Date of Birth: 1981-01-27 Admit Date: 09/22/2014 Admitting Physician Cristal Ford, DO ZJQ:BHALP,[id number] S, MD  Subjective: Continues to have significant pain in the left thigh area.  Assessment/Plan: Principal Problem:  Left thigh abscess with Cellulitis: Patient admitted and started on IV vancomycin, this morning significant induration with cellulitis seen in the left thigh area. Cellulitic area remains within the demarcated area. Patient needs a incision and drainage, keep nothing by mouth, general surgery has been consulted.  Active Problems:   Type 1 diabetes mellitus: Continue with Lantus/SSI-since nothing by mouth we will avoid aggressive CBG control. Will reassess in a.m. and adjust Lantus/SS I regimen accordingly.  Disposition: Remain inpatient  Antibiotics:  See below.   Anti-infectives    Start     Dose/Rate Route Frequency Ordered Stop   09/23/14 1200  aztreonam (AZACTAM) 2 g in dextrose 5 % 50 mL IVPB     2 g100 mL/hr over 30 Minutes Intravenous Every 8 hours 09/23/14 1012     09/23/14 1030  vancomycin (VANCOCIN) IVPB 1000 mg/200 mL premix     1,000 mg200 mL/hr over 60 Minutes Intravenous Every 8 hours 09/23/14 1014     09/22/14 1730  vancomycin (VANCOCIN) IVPB 1000 mg/200 mL premix     1,000 mg200 mL/hr over 60 Minutes Intravenous  Once 09/22/14 1715 09/22/14 2014   09/22/14 1430  vancomycin (VANCOCIN) IVPB 1000 mg/200 mL premix     1,000 mg200 mL/hr over 60 Minutes Intravenous  Once 09/22/14 1426 09/22/14 1532      DVT Prophylaxis: Prophylactic Lovenox   Code Status: Full code   Family Communication Fianc at bedside  Procedures:  None  CONSULTS:  general surgery  Time spent 40 minutes-which includes 50% of the time with face-to-face with patient/ family and coordinating care related to the above assessment and plan.  MEDICATIONS: Scheduled Meds: . aztreonam  2 g  Intravenous Q8H  . enoxaparin (LOVENOX) injection  40 mg Subcutaneous Q24H  . insulin aspart  0-15 Units Subcutaneous TID WC  . insulin glargine  50 Units Subcutaneous q morning - 10a  . sodium chloride  3 mL Intravenous Q12H  . vancomycin  1,000 mg Intravenous Q8H   Continuous Infusions:  PRN Meds:.sodium chloride, acetaminophen **OR** acetaminophen, HYDROcodone-acetaminophen, HYDROcodone-acetaminophen, ibuprofen, sodium chloride    PHYSICAL EXAM: Vital signs in last 24 hours: Filed Vitals:   09/22/14 1706 09/22/14 2051 09/23/14 0537 09/23/14 1017  BP:  99/58 95/56 107/69  Pulse:  76 73 80  Temp:  98 F (36.7 C) 97.6 F (36.4 C) 98 F (36.7 C)  TempSrc:  Oral Oral Oral  Resp:  16 16 18   Weight: 104.191 kg (229 lb 11.2 oz)     SpO2:  95% 98% 98%    Weight change:  Filed Weights   09/22/14 1706  Weight: 104.191 kg (229 lb 11.2 oz)   Body mass index is 38.22 kg/(m^2).   Gen Exam: Awake and alert with clear speech.   Neck: Supple, No JVD.   Chest: B/L Clear.   CVS: S1 S2 Regular, no murmurs.  Abdomen: soft, BS +, non tender, non distended.  Extremities: no edema, lower extremities warm to touch. Left thigh-around mid thigh 4-5 cm indurated area with pus point in the middle, approximately 10 cm of surrounding cellulitis. Exquisitely tender. Neurologic: Non Focal.   Skin: No Rash.   Wounds: N/A.   Intake/Output from previous  day: No intake or output data in the 24 hours ending 09/23/14 1125   LAB RESULTS: CBC  Recent Labs Lab 09/20/14 1212 09/22/14 1319 09/22/14 1716  WBC 8.2 8.5 8.7  HGB 14.9 14.8 13.7  HCT 43.4 42.3 40.1  PLT 314 305 299  MCV 90.4 91.6 92.0  MCH 31.0 32.0 31.4  MCHC 34.3 35.0 34.2  RDW 12.3 12.4 12.3  LYMPHSABS 2.7 2.5  --   MONOABS 0.5 0.6  --   EOSABS 0.2 0.2  --   BASOSABS 0.1 0.0  --     Chemistries   Recent Labs Lab 09/20/14 1212 09/22/14 1319 09/22/14 1716  NA 135 135  --   K 3.7 3.8  --   CL 105 102  --   CO2 23 22   --   GLUCOSE 319* 239*  --   BUN 9 9  --   CREATININE 0.64 0.69 0.60  CALCIUM 9.7 9.6  --     CBG:  Recent Labs Lab 09/22/14 2305 09/23/14 0115 09/23/14 0526 09/23/14 0623 09/23/14 1056  GLUCAP 189* 158* 244* 256* 328*    GFR Estimated Creatinine Clearance: 119.8 mL/min (by C-G formula based on Cr of 0.6).  Coagulation profile No results for input(s): INR, PROTIME in the last 168 hours.  Cardiac Enzymes No results for input(s): CKMB, TROPONINI, MYOGLOBIN in the last 168 hours.  Invalid input(s): CK  Invalid input(s): POCBNP No results for input(s): DDIMER in the last 72 hours. No results for input(s): HGBA1C in the last 72 hours. No results for input(s): CHOL, HDL, LDLCALC, TRIG, CHOLHDL, LDLDIRECT in the last 72 hours. No results for input(s): TSH, T4TOTAL, T3FREE, THYROIDAB in the last 72 hours.  Invalid input(s): FREET3 No results for input(s): VITAMINB12, FOLATE, FERRITIN, TIBC, IRON, RETICCTPCT in the last 72 hours. No results for input(s): LIPASE, AMYLASE in the last 72 hours.  Urine Studies No results for input(s): UHGB, CRYS in the last 72 hours.  Invalid input(s): UACOL, UAPR, USPG, UPH, UTP, UGL, UKET, UBIL, UNIT, UROB, ULEU, UEPI, UWBC, URBC, UBAC, CAST, UCOM, BILUA  MICROBIOLOGY: No results found for this or any previous visit (from the past 240 hour(s)).  RADIOLOGY STUDIES/RESULTS: No results found.  Oren Binet, MD  [facility] Pager:[phone]  If 7PM-7AM, please contact night-coverage [email or web address] Password [email or web address] 09/23/2014, 11:25 AM   LOS: 1 day

## 2014-09-23 NOTE — Consult Note (Signed)
Reason for Consult: Hidradenitis left thigh Referring Physician: Dr. Terance Ice  [name] is an 34 y.o. female.  HPI: Pt known to our practice with hidradenitis excision by Dr. Ninfa Linden back in 04/2012, both thighs, and left groin.  12/2013 left axilla, 05/2014, right thigh.   She is now back after admission last PM.   Pt said she developed a pimple like area mid left thigh on Monday, 09/18/14.  She was seen 2 days later in ED and placed on clindamycin, she had a migraines that started after taking the Clindamycin and she blames the clindamycin.  She switched over to some left over doxycycline. Cellulits has become more severe and apparently started draining on 12/31, but has not drained since.  She kept warm packs on it all evening.  Her sugars are up and  she called and spoke to Dr. Harlow Asa who referred her to the ED for evaluation. Work up in the [facility] shows labs are normal aside from her elevated glucose, possible UTI.  No radiology exam.  She is currently on lovenox and insulin.  Cultures from 05/2014 hospitalization show no growth.  She is extremely tender and I do not think we can do it at the bedside.  I will make her NPO and we will take her to the OR later this evening if the schedule will allow.  I will start her on Vancomycin and Zosyn.  Warm soaks in the interim.     Past Medical History  Diagnosis Date  . Hx MRSA infection   . Migraine   . Vitamin D insufficiency   . Asthma     as a child  . GERD (gastroesophageal reflux disease)     no current meds.  . Hidradenitis 04/2012    bilat. thighs, left groin - open areas on thighs  . Diabetes mellitus     IDDM, Insulin pump; followed by Dr. [name]  . Abscess of left axilla - PREVOTELLA BIVIA & Staph Coag Neg 07/12/2012    MODERATE PREVOTELLA BIVIA Note: BETA LACTAMASE NEGATIVE    . PCOS (polycystic ovarian syndrome)   . Heart murmur   . Pneumonia     hx  . Depression     hx  . Cancer     skin tag rt breast  .  Hydradenitis   . Cellulitis 06/01/2014    RT INNER THIGH    Past Surgical History  Procedure Laterality Date  . Wisdom tooth extraction    . Tonsillectomy    . Bunionectomy      left  . Esophagogastroduodenoscopy  11/17/2011    Procedure: ESOPHAGOGASTRODUODENOSCOPY (EGD);  Surgeon: [name], MD;  Location: [facility];  Service: Endoscopy;  Laterality: N/A;  . Eye surgery      exc. stye left eye  . Breast surgery      right lumpectomy  . Cholecystectomy  12/02/2006    lap. chole.  . Dilation and evacuation  02/14/2007  . Hydradenitis excision  04/30/2012    Procedure: EXCISION HYDRADENITIS GROIN;  Surgeon: [name], MD;  Location: [facility];  Service: General;  Laterality: Left;  wide excision hidradenitis bilateral thighs and Left groin  . Hydradenitis excision Left 01/13/2014    Procedure: WIDE EXCISION HIDRADENITIS LEFT AXILLA;  Surgeon: [name], MD;  Location: [facility];  Service: General;  Laterality: Left;  . Irrigation and debridement abscess Right 06/02/2014    Procedure: IRRIGATION AND DEBRIDEMENT ABSCESS;  Surgeon: [name], MD;  Location: [facility];  Service: General;  Laterality: Right;  . Left heart catheterization with coronary angiogram N/A 07/14/2014    Procedure: LEFT HEART CATHETERIZATION WITH CORONARY ANGIOGRAM;  Surgeon: [name], MD;  Location: [facility] CATH [facility];  Service: Cardiovascular;  Laterality: N/A;  . Cardiac catheterization      Family History  Problem Relation Age of Onset  . Hyperlipidemia Sister   . Hypertension Sister   . Hypertension Father     Social History:  reports that she has been smoking Cigarettes.  She has a 7 pack-year smoking history. She has never used smokeless tobacco. She reports that she does not drink alcohol or use illicit drugs.  Allergies:  Allergies  Allergen Reactions  . Latex Hives, Shortness Of Breath and Rash  . Levofloxacin Shortness Of Breath and Rash  . Moxifloxacin  Shortness Of Breath and Rash  . Oxycodone-Acetaminophen Shortness Of Breath, Swelling and Rash    NORCO/VICODIN OK  . Peach [Prunus Persica] Hives and Shortness Of Breath  . Penicillins Swelling and Rash  . Potassium-Containing Compounds Other (See Comments)    IV ROUTE - CAUSES VEINS TO COLLAPSE  . Prednisone Other (See Comments)    SEVERE ELEVATION OF BLOOD SUGAR. Able to tolerate 40 mg  . Propoxyphene N-Acetaminophen Swelling    SWELLING OF FACE AND THROAT  . Rosiglitazone Maleate Swelling    SWELLING OF FACE AND LEGS  . Xolair [Omalizumab] Other (See Comments)    Rash and anaphylaxis  . Adhesive [Tape] Hives, Itching and Rash  . Morphine And Related Other (See Comments)    Causes hallucinations  . Cefaclor Rash  . Keflex [Cephalexin] Diarrhea and Rash  . Promethazine Hcl Other (See Comments)    IV ROUTE ONLY - JITTERY FEELING. Patient reports that it is mild and she has used promethazine since then  . Sulfadiazine Rash    Medications:  Prior to Admission:  Prescriptions prior to admission  Medication Sig Dispense Refill Last Dose  . clindamycin (CLINDAGEL) 1 % gel Apply 1 application topically 2 (two) times daily as needed (boil / rash).    Past Month at Unknown time  . doxycycline (VIBRAMYCIN) 50 MG capsule Take 50 mg by mouth daily as needed (for boil/rash).   0 Past Month at Unknown time  . HYDROcodone-acetaminophen (NORCO/VICODIN) 5-325 MG per tablet Take 1-2 tablets by mouth every 4 (four) hours as needed for moderate pain.   Past Month at Unknown time  . insulin aspart (NOVOLOG) 100 UNIT/ML injection Inject 10-21 Units into the skin 3 (three) times daily before meals.    09/22/2014 at Unknown time  . insulin glargine (LANTUS) 100 UNIT/ML injection Inject 50-55 Units into the skin every morning.    09/22/2014 at Unknown time  . levonorgestrel (MIRENA) 20 MCG/24HR IUD 1 each by Intrauterine route once.   09/22/2014 at Unknown time  . mupirocin cream (BACTROBAN) 2 % Apply 1  application topically daily as needed (for boil/rash).   1 Past Month at Unknown time  . clindamycin (CLEOCIN) 150 MG capsule Take 2 capsules (300 mg total) by mouth every 6 (six) hours. 56 capsule 0 09/21/2014   Scheduled: . enoxaparin (LOVENOX) injection  40 mg Subcutaneous Q24H  . insulin aspart  0-15 Units Subcutaneous TID WC  . insulin glargine  50 Units Subcutaneous q morning - 10a  . sodium chloride  3 mL Intravenous Q12H   Continuous:  ZHG:DJMEQA chloride, acetaminophen **OR** acetaminophen, HYDROcodone-acetaminophen, HYDROcodone-acetaminophen, ibuprofen, sodium chloride Anti-infectives  Start     Dose/Rate Route Frequency Ordered Stop   09/22/14 1730  vancomycin (VANCOCIN) IVPB 1000 mg/200 mL premix     1,000 mg200 mL/hr over 60 Minutes Intravenous  Once 09/22/14 1715 09/22/14 2014   09/22/14 1430  vancomycin (VANCOCIN) IVPB 1000 mg/200 mL premix     1,000 mg200 mL/hr over 60 Minutes Intravenous  Once 09/22/14 1426 09/22/14 1532      Results for orders placed or performed during the hospital encounter of 09/22/14 (from the past 48 hour(s))  CBC with Differential     Status: None   Collection Time: 09/22/14  1:19 PM  Result Value Ref Range   WBC 8.5 4.0 - 10.5 K/uL   RBC 4.62 3.87 - 5.11 MIL/uL   Hemoglobin 14.8 12.0 - 15.0 g/dL   HCT 42.3 36.0 - 46.0 %   MCV 91.6 78.0 - 100.0 fL   MCH 32.0 26.0 - 34.0 pg   MCHC 35.0 30.0 - 36.0 g/dL   RDW 12.4 11.5 - 15.5 %   Platelets 305 150 - 400 K/uL   Neutrophils Relative % 59 43 - 77 %   Neutro Abs 5.1 1.7 - 7.7 K/uL   Lymphocytes Relative 30 12 - 46 %   Lymphs Abs 2.5 0.7 - 4.0 K/uL   Monocytes Relative 7 3 - 12 %   Monocytes Absolute 0.6 0.1 - 1.0 K/uL   Eosinophils Relative 3 0 - 5 %   Eosinophils Absolute 0.2 0.0 - 0.7 K/uL   Basophils Relative 1 0 - 1 %   Basophils Absolute 0.0 0.0 - 0.1 K/uL  Comprehensive metabolic panel     Status: Abnormal   Collection Time: 09/22/14  1:19 PM  Result Value Ref Range   Sodium 135  135 - 145 mmol/L    Comment: Please note change in reference range.   Potassium 3.8 3.5 - 5.1 mmol/L    Comment: Please note change in reference range.   Chloride 102 96 - 112 mEq/L   CO2 22 19 - 32 mmol/L   Glucose, Bld 239 (H) 70 - 99 mg/dL   BUN 9 6 - 23 mg/dL   Creatinine, Ser 0.69 0.50 - 1.10 mg/dL   Calcium 9.6 8.4 - 10.5 mg/dL   Total Protein 6.5 6.0 - 8.3 g/dL   Albumin 3.6 3.5 - 5.2 g/dL   AST 14 0 - 37 U/L   ALT 14 0 - 35 U/L   Alkaline Phosphatase 86 39 - 117 U/L   Total Bilirubin 0.8 0.3 - 1.2 mg/dL   GFR calc non Af Amer >90 >90 mL/min   GFR calc Af Amer >90 >90 mL/min    Comment: (NOTE) The eGFR has been calculated using the CKD EPI equation. This calculation has not been validated in all clinical situations. eGFR's persistently <90 mL/min signify possible Chronic Kidney Disease.    Anion gap 11 5 - 15  I-Stat CG4 Lactic Acid, ED     Status: None   Collection Time: 09/22/14  1:27 PM  Result Value Ref Range   Lactic Acid, Venous 0.59 0.5 - 2.2 mmol/L  Pregnancy, urine     Status: None   Collection Time: 09/22/14  2:23 PM  Result Value Ref Range   Preg Test, Ur NEGATIVE NEGATIVE    Comment:        THE SENSITIVITY OF THIS METHODOLOGY IS >20 mIU/mL.   Urinalysis, Routine w reflex microscopic     Status: Abnormal   Collection Time:  09/22/14  2:24 PM  Result Value Ref Range   Color, Urine YELLOW YELLOW   APPearance CLEAR CLEAR   Specific Gravity, Urine 1.031 (H) 1.005 - 1.030   pH 5.5 5.0 - 8.0   Glucose, UA >1000 (A) NEGATIVE mg/dL   Hgb urine dipstick TRACE (A) NEGATIVE   Bilirubin Urine NEGATIVE NEGATIVE   Ketones, ur 15 (A) NEGATIVE mg/dL   Protein, ur 30 (A) NEGATIVE mg/dL   Urobilinogen, UA 0.2 0.0 - 1.0 mg/dL   Nitrite NEGATIVE NEGATIVE   Leukocytes, UA NEGATIVE NEGATIVE  Urine microscopic-add on     Status: Abnormal   Collection Time: 09/22/14  2:24 PM  Result Value Ref Range   Squamous Epithelial / LPF MANY (A) RARE   WBC, UA 0-2 <3 WBC/hpf    RBC / HPF 0-2 <3 RBC/hpf   Bacteria, UA MANY (A) RARE   Urine-Other RARE YEAST   Glucose, capillary     Status: Abnormal   Collection Time: 09/22/14  5:00 PM  Result Value Ref Range   Glucose-Capillary 175 (H) 70 - 99 mg/dL  CBC     Status: None   Collection Time: 09/22/14  5:16 PM  Result Value Ref Range   WBC 8.7 4.0 - 10.5 K/uL   RBC 4.36 3.87 - 5.11 MIL/uL   Hemoglobin 13.7 12.0 - 15.0 g/dL   HCT 40.1 36.0 - 46.0 %   MCV 92.0 78.0 - 100.0 fL   MCH 31.4 26.0 - 34.0 pg   MCHC 34.2 30.0 - 36.0 g/dL   RDW 12.3 11.5 - 15.5 %   Platelets 299 150 - 400 K/uL  Creatinine, serum     Status: None   Collection Time: 09/22/14  5:16 PM  Result Value Ref Range   Creatinine, Ser 0.60 0.50 - 1.10 mg/dL   GFR calc non Af Amer >90 >90 mL/min   GFR calc Af Amer >90 >90 mL/min    Comment: (NOTE) The eGFR has been calculated using the CKD EPI equation. This calculation has not been validated in all clinical situations. eGFR's persistently <90 mL/min signify possible Chronic Kidney Disease.   Glucose, capillary     Status: Abnormal   Collection Time: 09/22/14  9:44 PM  Result Value Ref Range   Glucose-Capillary 218 (H) 70 - 99 mg/dL  Glucose, capillary     Status: Abnormal   Collection Time: 09/22/14 11:05 PM  Result Value Ref Range   Glucose-Capillary 189 (H) 70 - 99 mg/dL  Glucose, capillary     Status: Abnormal   Collection Time: 09/23/14  1:15 AM  Result Value Ref Range   Glucose-Capillary 158 (H) 70 - 99 mg/dL  Glucose, capillary     Status: Abnormal   Collection Time: 09/23/14  5:26 AM  Result Value Ref Range   Glucose-Capillary 244 (H) 70 - 99 mg/dL  Glucose, capillary     Status: Abnormal   Collection Time: 09/23/14  6:23 AM  Result Value Ref Range   Glucose-Capillary 256 (H) 70 - 99 mg/dL    No results found.  Review of Systems  Constitutional: Positive for chills. Negative for fever, weight loss, malaise/fatigue and diaphoresis.  HENT: Negative.   Eyes: Negative.    Respiratory: Negative.   Cardiovascular: Negative.   Gastrointestinal: Positive for heartburn (occasional) and nausea (some Tues and WED, none since.). Negative for vomiting, abdominal pain, diarrhea, constipation, blood in stool and melena.  Genitourinary: Negative.   Musculoskeletal: Negative.   Skin:  Small pimple mid left medial thigh now about 4 cm area that has 10-12 cm of cellulitis around the site.  Neurological: Negative.  Negative for weakness.  Endo/Heme/Allergies: Negative.   Psychiatric/Behavioral: Positive for depression.   Blood pressure 95/56, pulse 73, temperature 97.6 F (36.4 C), temperature source Oral, resp. rate 16, weight 104.191 kg (229 lb 11.2 oz), last menstrual period 09/13/2014, SpO2 98 %. Physical Exam  Constitutional: She is oriented to person, place, and time. She appears well-developed and well-nourished. No distress.  HENT:  Head: Normocephalic and atraumatic.  Nose: Nose normal.  Mouth/Throat: No oropharyngeal exudate.  Eyes: Conjunctivae and EOM are normal. Pupils are equal, round, and reactive to light. Right eye exhibits no discharge. Left eye exhibits no discharge.  Neck: Normal range of motion. Neck supple. No JVD present. No tracheal deviation present. No thyromegaly present.  Cardiovascular: Normal rate, regular rhythm, normal heart sounds and intact distal pulses.  Exam reveals no gallop.   No murmur heard. Respiratory: Effort normal and breath sounds normal. No respiratory distress. She has no wheezes. She has no rales. She exhibits no tenderness.  GI: Soft. Bowel sounds are normal. She exhibits no distension and no mass. There is no tenderness. There is no rebound and no guarding.  Musculoskeletal: She exhibits no edema or tenderness.  Lymphadenopathy:    She has no cervical adenopathy.  Neurological: She is alert and oriented to person, place, and time. Coordination normal.  Skin: Skin is warm. She is not diaphoretic. No erythema.  4  cm mid thigh, medial aspect with some crusting around mid part, but it's dry and not draining now.  10-12 cm of surrounding skin with cellulitis.  She doesn't even like me touching it.    Psychiatric: She has a normal mood and affect. Her behavior is normal. Judgment and thought content normal.    Assessment/Plan: 1.  Mid left thigh abscess and cellulitis 2.  Hx of recurrent hidradenitis with 3 prior surgeries. 3.  Type I diabetes 4.   PCOS (polycystic ovarian syndrome) 5.  Migraines 6.  Multiple allergies including Levofloxacin, Moxifloxacin, penicillins, and now clindamycin.    Plan:  Add gram negative coverage and take to OR later today.  If the schedule will allow.   Pt aware of the plan and is in agreement.  Shataya Winkles 09/23/2014, 9:16 AM

## 2014-09-23 NOTE — Transfer of Care (Signed)
Immediate Anesthesia Transfer of Care Note  Patient: [name]  Procedure(s) Performed: Procedure(s): IRRIGATION AND DEBRIDEMENT ABSCESS/LEFT THIGH (Left)  Patient Location: PACU  Anesthesia Type:General  Level of Consciousness: awake, alert , oriented and patient cooperative  Airway & Oxygen Therapy: Patient Spontanous Breathing and Patient connected to nasal cannula oxygen  Post-op Assessment: Report given to PACU RN, Post -op Vital signs reviewed and stable and Patient moving all extremities  Post vital signs: Reviewed and stable  Complications: No apparent anesthesia complications

## 2014-09-23 NOTE — Anesthesia Postprocedure Evaluation (Signed)
  Anesthesia Post-op Note  Patient: [name]  Procedure(s) Performed: Procedure(s): IRRIGATION AND DEBRIDEMENT ABSCESS/LEFT THIGH (Left)  Patient Location: PACU  Anesthesia Type:General  Level of Consciousness: awake and alert   Airway and Oxygen Therapy: Patient Spontanous Breathing  Post-op Pain: mild  Post-op Assessment: Post-op Vital signs reviewed  Post-op Vital Signs: stable  Last Vitals:  Filed Vitals:   09/23/14 1554  BP: 107/75  Pulse: 74  Temp: 36.7 C  Resp: 14    Complications: No apparent anesthesia complications

## 2014-09-24 LAB — GLUCOSE, CAPILLARY
GLUCOSE-CAPILLARY: 293 mg/dL — AB (ref 70–99)
GLUCOSE-CAPILLARY: 176 mg/dL — AB (ref 70–99)
Glucose-Capillary: 78 mg/dL (ref 70–99)
Glucose-Capillary: 85 mg/dL (ref 70–99)

## 2014-09-24 LAB — BASIC METABOLIC PANEL
Anion gap: 3 — ABNORMAL LOW (ref 5–15)
BUN: 11 mg/dL (ref 6–23)
CO2: 26 mmol/L (ref 19–32)
Calcium: 8.3 mg/dL — ABNORMAL LOW (ref 8.4–10.5)
Chloride: 105 mEq/L (ref 96–112)
Creatinine, Ser: 0.7 mg/dL (ref 0.50–1.10)
Glucose, Bld: 288 mg/dL — ABNORMAL HIGH (ref 70–99)
Potassium: 4.2 mmol/L (ref 3.5–5.1)
SODIUM: 134 mmol/L — AB (ref 135–145)

## 2014-09-24 LAB — CBC
HCT: 37.4 % (ref 36.0–46.0)
Hemoglobin: 12.4 g/dL (ref 12.0–15.0)
MCH: 30.6 pg (ref 26.0–34.0)
MCHC: 33.2 g/dL (ref 30.0–36.0)
MCV: 92.3 fL (ref 78.0–100.0)
PLATELETS: 264 10*3/uL (ref 150–400)
RBC: 4.05 MIL/uL (ref 3.87–5.11)
RDW: 12.3 % (ref 11.5–15.5)
WBC: 8.8 10*3/uL (ref 4.0–10.5)

## 2014-09-24 LAB — HIV ANTIBODY (ROUTINE TESTING W REFLEX): HIV 1&2 Ab, 4th Generation: NONREACTIVE

## 2014-09-24 MED ORDER — POLYETHYLENE GLYCOL 3350 17 G PO PACK
17.0000 g | PACK | Freq: Every day | ORAL | Status: DC
  Administered 2014-09-24 – 2014-09-25 (×2): 17 g via ORAL
  Filled 2014-09-24 (×3): qty 1

## 2014-09-24 MED ORDER — IBUPROFEN 400 MG PO TABS
400.0000 mg | ORAL_TABLET | Freq: Four times a day (QID) | ORAL | Status: DC | PRN
  Filled 2014-09-24: qty 1

## 2014-09-24 MED ORDER — HYDROMORPHONE HCL 1 MG/ML IJ SOLN
0.5000 mg | INTRAMUSCULAR | Status: DC | PRN
  Administered 2014-09-24: 0.5 mg via INTRAVENOUS
  Filled 2014-09-24: qty 1

## 2014-09-24 MED ORDER — INSULIN ASPART 100 UNIT/ML ~~LOC~~ SOLN
8.0000 [IU] | Freq: Three times a day (TID) | SUBCUTANEOUS | Status: DC
  Administered 2014-09-24 – 2014-09-25 (×4): 8 [IU] via SUBCUTANEOUS

## 2014-09-24 MED ORDER — IBUPROFEN 800 MG PO TABS
800.0000 mg | ORAL_TABLET | Freq: Four times a day (QID) | ORAL | Status: DC | PRN
  Filled 2014-09-24: qty 1

## 2014-09-24 MED ORDER — MORPHINE SULFATE 2 MG/ML IJ SOLN: 1.0000 mg | INTRAMUSCULAR | Status: DC | PRN

## 2014-09-24 NOTE — Progress Notes (Addendum)
PATIENT DETAILS Name: [name] Age: 34 y.o. Sex: female Date of Birth: April 07, 1981 Admit Date: 09/22/2014 Admitting Physician Cristal Ford, DO WLN:[email or web address],JHERDEY S, MD  Subjective: Less pain in her left thigh area today  Assessment/Plan: Principal Problem:  Left thigh abscess with Cellulitis: Patient admitted and started on IV vancomycin, unfortunately no improvement with IV Abx, concerned area in left thigh with induration, highly suspicious of abscess. Gen Surgery consulted, subsequently underwent I&D ON 09/23/14. Much improved, area in left thigh with significantly less cellulitic changes and less tender. Await intraoperative cultures done on 1/2, and blood cultures done on 09/22/14.In interim, continue with IV Abx and wound care per Gen Surgery  Active Problems:   Type 1 diabetes mellitus: Continue with Lantus/SSI-add Novolog 8 units with meals. Follow CBG's and adjust regimen accordingly.  Disposition: Remain inpatient  Antibiotics:  See below.   Anti-infectives    Start     Dose/Rate Route Frequency Ordered Stop   09/23/14 1200  aztreonam (AZACTAM) 2 g in dextrose 5 % 50 mL IVPB     2 g100 mL/hr over 30 Minutes Intravenous Every 8 hours 09/23/14 1012     09/23/14 1030  vancomycin (VANCOCIN) IVPB 1000 mg/200 mL premix     1,000 mg200 mL/hr over 60 Minutes Intravenous Every 8 hours 09/23/14 1014     09/22/14 1730  vancomycin (VANCOCIN) IVPB 1000 mg/200 mL premix     1,000 mg200 mL/hr over 60 Minutes Intravenous  Once 09/22/14 1715 09/22/14 2014   09/22/14 1430  vancomycin (VANCOCIN) IVPB 1000 mg/200 mL premix     1,000 mg200 mL/hr over 60 Minutes Intravenous  Once 09/22/14 1426 09/22/14 1532      DVT Prophylaxis: Prophylactic Lovenox   Code Status: Full code   Family Communication Fianc at bedside  Procedures:  None  CONSULTS:  general surgery  MEDICATIONS: Scheduled Meds: . aztreonam  2 g Intravenous Q8H  . enoxaparin (LOVENOX) injection   40 mg Subcutaneous Q24H  . insulin aspart  0-15 Units Subcutaneous TID WC  . insulin aspart  8 Units Subcutaneous TID WC  . insulin glargine  50 Units Subcutaneous Q24H  . vancomycin  1,000 mg Intravenous Q8H   Continuous Infusions: . lactated ringers 10 mL/hr at 09/23/14 1348   PRN Meds:.acetaminophen **OR** acetaminophen, HYDROcodone-acetaminophen, HYDROcodone-acetaminophen, ibuprofen    PHYSICAL EXAM: Vital signs in last 24 hours: Filed Vitals:   09/23/14 1841 09/23/14 1859 09/23/14 2100 09/24/14 0526  BP: 101/57 102/62 101/54 90/56  Pulse: 86 83 94 70  Temp: 98.1 F (36.7 C) 97.8 F (36.6 C) 98.2 F (36.8 C) 98 F (36.7 C)  TempSrc:  Oral Oral Oral  Resp: 16 16 16 18   Weight:      SpO2: 100% 100% 97% 97%    Weight change:  Filed Weights   09/22/14 1706  Weight: 104.191 kg (229 lb 11.2 oz)   Body mass index is 38.22 kg/(m^2).   Gen Exam: Awake and alert with clear speech.   Neck: Supple, No JVD.   Chest: B/L Clear.   CVS: S1 S2 Regular, no murmurs.  Abdomen: soft, BS +, non tender, non distended.  Extremities: no edema, lower extremities warm to touch. Left thigh wound-packing in place, less tender and significantly less erythema and induration. Neurologic: Non Focal.   Skin: No Rash.   Wounds: N/A.   Intake/Output from previous day:  Intake/Output Summary (Last 24 hours) at 09/24/14 0902 Last data filed at 09/24/14  0500  Gross per 24 hour  Intake    860 ml  Output     30 ml  Net    830 ml     LAB RESULTS: CBC  Recent Labs Lab 09/20/14 1212 09/22/14 1319 09/22/14 1716 09/24/14 0500  WBC 8.2 8.5 8.7 8.8  HGB 14.9 14.8 13.7 12.4  HCT 43.4 42.3 40.1 37.4  PLT 314 305 299 264  MCV 90.4 91.6 92.0 92.3  MCH 31.0 32.0 31.4 30.6  MCHC 34.3 35.0 34.2 33.2  RDW 12.3 12.4 12.3 12.3  LYMPHSABS 2.7 2.5  --   --   MONOABS 0.5 0.6  --   --   EOSABS 0.2 0.2  --   --   BASOSABS 0.1 0.0  --   --     Chemistries   Recent Labs Lab 09/20/14 1212  09/22/14 1319 09/22/14 1716 09/24/14 0500  NA 135 135  --  134*  K 3.7 3.8  --  4.2  CL 105 102  --  105  CO2 23 22  --  26  GLUCOSE 319* 239*  --  288*  BUN 9 9  --  11  CREATININE 0.64 0.69 0.60 0.70  CALCIUM 9.7 9.6  --  8.3*    CBG:  Recent Labs Lab 09/23/14 1804 09/23/14 1825 09/23/14 1858 09/23/14 2135 09/24/14 0618  GLUCAP 75 70 74 255* 293*    GFR Estimated Creatinine Clearance: 119.8 mL/min (by C-G formula based on Cr of 0.7).  Coagulation profile No results for input(s): INR, PROTIME in the last 168 hours.  Cardiac Enzymes No results for input(s): CKMB, TROPONINI, MYOGLOBIN in the last 168 hours.  Invalid input(s): CK  Invalid input(s): POCBNP No results for input(s): DDIMER in the last 72 hours. No results for input(s): HGBA1C in the last 72 hours. No results for input(s): CHOL, HDL, LDLCALC, TRIG, CHOLHDL, LDLDIRECT in the last 72 hours. No results for input(s): TSH, T4TOTAL, T3FREE, THYROIDAB in the last 72 hours.  Invalid input(s): FREET3 No results for input(s): VITAMINB12, FOLATE, FERRITIN, TIBC, IRON, RETICCTPCT in the last 72 hours. No results for input(s): LIPASE, AMYLASE in the last 72 hours.  Urine Studies No results for input(s): UHGB, CRYS in the last 72 hours.  Invalid input(s): UACOL, UAPR, USPG, UPH, UTP, UGL, UKET, UBIL, UNIT, UROB, ULEU, UEPI, UWBC, URBC, UBAC, CAST, UCOM, BILUA  MICROBIOLOGY: No results found for this or any previous visit (from the past 240 hour(s)).  RADIOLOGY STUDIES/RESULTS: No results found.  Oren Binet, MD  [facility] Pager:[phone]  If 7PM-7AM, please contact night-coverage [email or web address] Password [email or web address] 09/24/2014, 9:02 AM   LOS: 2 days

## 2014-09-24 NOTE — Progress Notes (Signed)
1 Day Post-Op  Subjective: Eating will check wound later. Dressing site changed and it looks fine.  She pulled the dressing off herself and is use to doing this.   Objective: Vital signs in last 24 hours: Temp:  [97.8 F (36.6 C)-98.2 F (36.8 C)] 98 F (36.7 C) (01/03 0526) Pulse Rate:  [66-95] 70 (01/03 0526) Resp:  [9-21] 18 (01/03 0526) BP: (90-110)/(52-75) 90/56 mmHg (01/03 0526) SpO2:  [97 %-100 %] 97 % (01/03 0526) Last BM Date:  (PTA) Carb modified diet Afebrile, VSS Glucose 288 WBC is normal Intake/Output from previous day: 01/02 0701 - 01/03 0700 In: 860 [P.O.:360; I.V.:500] Out: 30 [Blood:30] Intake/Output this shift:    General appearance: alert, cooperative and no distress Skin: open site is clean and looks good. Cellulitis around it is markedly improved.    Lab Results:   Recent Labs  09/22/14 1716 09/24/14 0500  WBC 8.7 8.8  HGB 13.7 12.4  HCT 40.1 37.4  PLT 299 264    BMET  Recent Labs  09/22/14 1319 09/22/14 1716 09/24/14 0500  NA 135  --  134*  K 3.8  --  4.2  CL 102  --  105  CO2 22  --  26  GLUCOSE 239*  --  288*  BUN 9  --  11  CREATININE 0.69 0.60 0.70  CALCIUM 9.6  --  8.3*   PT/INR No results for input(s): LABPROT, INR in the last 72 hours.   Recent Labs Lab 09/22/14 1319  AST 14  ALT 14  ALKPHOS 86  BILITOT 0.8  PROT 6.5  ALBUMIN 3.6     Lipase     Component Value Date/Time   LIPASE 23 04/01/2010 1503     Studies/Results: No results found.  Medications: . aztreonam  2 g Intravenous Q8H  . enoxaparin (LOVENOX) injection  40 mg Subcutaneous Q24H  . insulin aspart  0-15 Units Subcutaneous TID WC  . insulin glargine  50 Units Subcutaneous Q24H  . vancomycin  1,000 mg Intravenous Q8H    Assessment/Plan 1. Infected Left Thigh hidradenitis IRRIGATION DRAINAGE AND DEBRIDEMENT ABSCESS/LEFT THIGH, 09/23/2014, [name], MD 2. Hx of recurrent hidradenitis with 3 prior surgeries. 3. Type I diabetes 4.  PCOS (polycystic ovarian syndrome) 5. Migraines 6. Multiple allergies including Levofloxacin, Moxifloxacin, penicillins, and now clindamycin.    Plan:  Wound culture isn't back, with all her allergies/intolerance there are not allot of choices available.  She can shower and do dressing changes BID.  She has a follow up appointment with Dr. Ninfa Linden for 10/10/13, so I will just let her follow up with him.      LOS: 2 days    [name] 09/24/2014

## 2014-09-24 NOTE — Discharge Instructions (Signed)
Abscess An abscess is an infected area that contains a collection of pus and debris.It can occur in almost any part of the body. An abscess is also known as a furuncle or boil. CAUSES  An abscess occurs when tissue gets infected. This can occur from blockage of oil or sweat glands, infection of hair follicles, or a minor injury to the skin. As the body tries to fight the infection, pus collects in the area and creates pressure under the skin. This pressure causes pain. People with weakened immune systems have difficulty fighting infections and get certain abscesses more often.  SYMPTOMS Usually an abscess develops on the skin and becomes a painful mass that is red, warm, and tender. If the abscess forms under the skin, you may feel a moveable soft area under the skin. Some abscesses break open (rupture) on their own, but most will continue to get worse without care. The infection can spread deeper into the body and eventually into the bloodstream, causing you to feel ill.  DIAGNOSIS  Your caregiver will take your medical history and perform a physical exam. A sample of fluid may also be taken from the abscess to determine what is causing your infection. TREATMENT  Your caregiver may prescribe antibiotic medicines to fight the infection. However, taking antibiotics alone usually does not cure an abscess. Your caregiver may need to make a small cut (incision) in the abscess to drain the pus. In some cases, gauze is packed into the abscess to reduce pain and to continue draining the area. HOME CARE INSTRUCTIONS   Only take over-the-counter or prescription medicines for pain, discomfort, or fever as directed by your caregiver.  If you were prescribed antibiotics, take them as directed. Finish them even if you start to feel better.  If gauze is used, follow your caregiver's directions for changing the gauze.  To avoid spreading the infection:  Keep your draining abscess covered with a  bandage.  Wash your hands well.  Do not share personal care items, towels, or whirlpools with others.  Avoid skin contact with others.  Keep your skin and clothes clean around the abscess.  Keep all follow-up appointments as directed by your caregiver. SEEK MEDICAL CARE IF:   You have increased pain, swelling, redness, fluid drainage, or bleeding.  You have muscle aches, chills, or a general ill feeling.  You have a fever. MAKE SURE YOU:   Understand these instructions.  Will watch your condition.  Will get help right away if you are not doing well or get worse. Document Released: 06/18/2005 Document Revised: 03/09/2012 Document Reviewed: 11/21/2011 [facility] Patient Information 2015 [place], [place]. This information is not intended to replace advice given to you by your health care provider. Make sure you discuss any questions you have with your health care provider. Dressing Change A dressing is a material placed over wounds. It keeps the wound clean, dry, and protected from further injury.  BEFORE YOU BEGIN  Get your supplies together. Things you may need include:  Salt solution (saline).  Flexible gauze bandage.  Medicated cream.  Tape.  Gloves.  Belly (abdominal) pads.  Gauze squares.  Plastic bags.  Take pain medicine 30 minutes before the bandage change if you need it.  Take a shower before you do the first bandage change of the day. Put plastic wrap or a bag over the dressing. REMOVING YOUR OLD BANDAGE  Wash your hands with soap and water. Dry your hands with a clean towel.  Put on  your gloves.  Remove any tape.  Remove the old bandage as told. If it sticks, put a small amount of warm water on it to loosen the bandage.  Remove any gauze or packing tape in your wound.  Take off your gloves.  Put the gloves, tape, gauze, or any packing tape in a plastic bag. CHANGING YOUR BANDAGE  Open the supplies.  Take the cap off the salt  solution.  Open the gauze. Leave the gauze on the inside of the package.  Put on your gloves.  Clean your wound as told by your doctor.  Keep your wound dry if your doctor told you to do so.  Your doctor may tell you to do one or more of the following:  Pick up the gauze. Pour the salt solution over the gauze. Squeeze out the extra salt solution.  Put medicated cream or other medicine on your wound.  Put solution soaked gauze only in your wound, not on the skin around it.  Pack your wound loosely.  Put dry gauze on your wound.  Put belly pads over the dry gauze if your bandages soak through.  Tape the bandage in place so it will not fall off. Do not wrap the tape all the way around your arm or leg.  Wrap the bandage with the flexible gauze bandage as told by your doctor.  Take off your gloves. Put them in the plastic bag with the old bandage. Tie the bag shut and throw it away.  Keep the bandage clean and dry.  Wash your hands. GET HELP RIGHT AWAY IF:   Your skin around the wound looks red.  Your wound feels more tender or sore.  You see yellowish-white fluid (pus) in the wound.  Your wound smells bad.  You have a fever.  Your skin around the wound has a red rash that itches and burns.  You see black or yellow skin in your wound that was not there before.  You feel sick to your stomach (nauseous), throw up (vomit), and feel very tired. Document Released: 12/05/2008 Document Revised: 01/23/2014 Document Reviewed: 07/20/2011 [facility] Patient Information 2015 [place], [place]. This information is not intended to replace advice given to you by your health care provider. Make sure you discuss any questions you have with your health care provider.

## 2014-09-25 ENCOUNTER — Encounter (HOSPITAL_COMMUNITY): Payer: Self-pay | Admitting: General Surgery

## 2014-09-25 DIAGNOSIS — E109 Type 1 diabetes mellitus without complications: Secondary | ICD-10-CM

## 2014-09-25 LAB — GLUCOSE, CAPILLARY
Glucose-Capillary: 138 mg/dL — ABNORMAL HIGH (ref 70–99)
Glucose-Capillary: 131 mg/dL — ABNORMAL HIGH (ref 70–99)

## 2014-09-25 MED ORDER — DOXYCYCLINE HYCLATE 50 MG PO CAPS: 100.0000 mg | ORAL_CAPSULE | Freq: Two times a day (BID) | ORAL | Status: DC

## 2014-09-25 MED ORDER — HYDROCODONE-ACETAMINOPHEN 5-325 MG PO TABS: 1.0000 | ORAL_TABLET | Freq: Four times a day (QID) | ORAL | Status: DC | PRN

## 2014-09-25 NOTE — Progress Notes (Signed)
[facility] Progress Note  2 Days Post-Op  Subjective: Pt doing well, dressing changes tolerable.  WBC normal and afebrile.  Mobilizing well and tolerating diet.  Plans to f/u with Dr. Ninfa Linden in 2 weeks.  Objective: Vital signs in last 24 hours: Temp:  [97.9 F (36.6 C)-99.3 F (37.4 C)] 98.2 F (36.8 C) (01/04 0614) Pulse Rate:  [76-95] 76 (01/04 0614) Resp:  [18] 18 (01/04 0614) BP: (91-103)/(54-67) 91/54 mmHg (01/04 0614) SpO2:  [96 %-99 %] 96 % (01/04 0614) Last BM Date: 09/24/14  Intake/Output from previous day: 01/03 0701 - 01/04 0700 In: 2010 [P.O.:1560; IV Piggyback:450] Out: -  Intake/Output this shift:    PE: Gen:  Alert, NAD, pleasant Left thigh:  clean open wound with minimal drainage, no foul smell, cellulitis has receded away from the purple marker significantly, much softer, no purulent drainage   Lab Results:   Recent Labs  09/22/14 1716 09/24/14 0500  WBC 8.7 8.8  HGB 13.7 12.4  HCT 40.1 37.4  PLT 299 264   BMET  Recent Labs  09/22/14 1319 09/22/14 1716 09/24/14 0500  NA 135  --  134*  K 3.8  --  4.2  CL 102  --  105  CO2 22  --  26  GLUCOSE 239*  --  288*  BUN 9  --  11  CREATININE 0.69 0.60 0.70  CALCIUM 9.6  --  8.3*   PT/INR No results for input(s): LABPROT, INR in the last 72 hours. CMP     Component Value Date/Time   NA 134* 09/24/2014 0500   K 4.2 09/24/2014 0500   CL 105 09/24/2014 0500   CO2 26 09/24/2014 0500   GLUCOSE 288* 09/24/2014 0500   BUN 11 09/24/2014 0500   CREATININE 0.70 09/24/2014 0500   CALCIUM 8.3* 09/24/2014 0500   CALCIUM 8.9 08/04/2008 2210   PROT 6.5 09/22/2014 1319   ALBUMIN 3.6 09/22/2014 1319   AST 14 09/22/2014 1319   ALT 14 09/22/2014 1319   ALKPHOS 86 09/22/2014 1319   BILITOT 0.8 09/22/2014 1319   GFRNONAA >90 09/24/2014 0500   GFRAA >90 09/24/2014 0500   Lipase     Component Value Date/Time   LIPASE 23 04/01/2010 1503       Studies/Results: No results  found.  Anti-infectives: Anti-infectives    Start     Dose/Rate Route Frequency Ordered Stop   09/23/14 1200  aztreonam (AZACTAM) 2 g in dextrose 5 % 50 mL IVPB     2 g100 mL/hr over 30 Minutes Intravenous Every 8 hours 09/23/14 1012     09/23/14 1030  vancomycin (VANCOCIN) IVPB 1000 mg/200 mL premix     1,000 mg200 mL/hr over 60 Minutes Intravenous Every 8 hours 09/23/14 1014     09/22/14 1730  vancomycin (VANCOCIN) IVPB 1000 mg/200 mL premix     1,000 mg200 mL/hr over 60 Minutes Intravenous  Once 09/22/14 1715 09/22/14 2014   09/22/14 1430  vancomycin (VANCOCIN) IVPB 1000 mg/200 mL premix     1,000 mg200 mL/hr over 60 Minutes Intravenous  Once 09/22/14 1426 09/22/14 1532       Assessment/Plan 1. Infected Left Thigh hidradenitis IRRIGATION DRAINAGE AND DEBRIDEMENT ABSCESS/LEFT THIGH, 09/23/2014, [name], MD 2. Hx of recurrent hidradenitis with 3 prior surgeries by DB 3. Type I diabetes 4. PCOS (polycystic ovarian syndrome) 5. Migraines 6. Multiple allergies including Levofloxacin, Moxifloxacin, penicillins, and now clindamycin.   Plan:  1.  Wound culture isn't back, but doxy  is likely fine.  2.  She can shower with baby shampoo and clean the left thigh wound, and do WD dressing changes once a day as long as its clean 3.  She has a follow up appointment with Dr. Ninfa Linden for 10/10/13, so she can keep that    LOS: 3 days    [place], [facility] 09/25/2014, 11:34 AM Pager: [phone]

## 2014-09-25 NOTE — Discharge Summary (Addendum)
PATIENT DETAILS Name: [name] Age: 34 y.o. Sex: female Date of Birth: [birth year] MRN: [id number]. Admitting Physician: Cristal Ford, DO OEU:[id number] S, MD  Admit Date: 09/22/2014 Discharge date: 09/25/2014  Recommendations for Outpatient Follow-up:  1. Ensure follow up with Gen Surgery 2. Please follow intraoperative wound cultures till final-neg so far 3. Gen Health Maintenance  PRIMARY DISCHARGE DIAGNOSIS:  Principal Problem:   Left Thigh Cellulitis with abscess Active Problems:   Type 1 diabetes mellitus      PAST MEDICAL HISTORY: Past Medical History  Diagnosis Date  . Hx MRSA infection   . Migraine   . Vitamin D insufficiency   . Asthma     as a child  . GERD (gastroesophageal reflux disease)     no current meds.  . Hidradenitis 04/2012    bilat. thighs, left groin - open areas on thighs  . Diabetes mellitus     IDDM, Insulin pump; followed by Dr. [name]  . Abscess of left axilla - PREVOTELLA BIVIA & Staph Coag Neg 07/12/2012    MODERATE PREVOTELLA BIVIA Note: BETA LACTAMASE NEGATIVE    . PCOS (polycystic ovarian syndrome)   . Heart murmur   . Pneumonia     hx  . Depression     hx  . Cancer     skin tag rt breast  . Hydradenitis   . Cellulitis 06/01/2014    RT INNER THIGH    DISCHARGE MEDICATIONS: Current Discharge Medication List    CONTINUE these medications which have CHANGED   Details  doxycycline (VIBRAMYCIN) 50 MG capsule Take 2 capsules (100 mg total) by mouth 2 (two) times daily. Qty: 40 capsule, Refills: 0    HYDROcodone-acetaminophen (NORCO/VICODIN) 5-325 MG per tablet Take 1-2 tablets by mouth every 6 (six) hours as needed for moderate pain. Qty: 30 tablet, Refills: 0      CONTINUE these medications which have NOT CHANGED   Details  clindamycin (CLINDAGEL) 1 % gel Apply 1 application topically 2 (two) times daily as needed (boil / rash).     insulin aspart (NOVOLOG) 100 UNIT/ML injection Inject 10-21 Units into  the skin 3 (three) times daily before meals.     insulin glargine (LANTUS) 100 UNIT/ML injection Inject 50-55 Units into the skin every morning.     levonorgestrel (MIRENA) 20 MCG/24HR IUD 1 each by Intrauterine route once.    mupirocin cream (BACTROBAN) 2 % Apply 1 application topically daily as needed (for boil/rash).  Refills: 1      STOP taking these medications     clindamycin (CLEOCIN) 150 MG capsule         ALLERGIES:   Allergies  Allergen Reactions  . Latex Hives, Shortness Of Breath and Rash  . Levofloxacin Shortness Of Breath and Rash  . Moxifloxacin Shortness Of Breath and Rash  . Oxycodone-Acetaminophen Shortness Of Breath, Swelling and Rash    NORCO/VICODIN OK  . Peach [Prunus Persica] Hives and Shortness Of Breath  . Penicillins Swelling and Rash  . Potassium-Containing Compounds Other (See Comments)    IV ROUTE - CAUSES VEINS TO COLLAPS; Reports that it is undiluted K only  . Prednisone Other (See Comments)    SEVERE ELEVATION OF BLOOD SUGAR. Able to tolerate 40 mg  . Propoxyphene N-Acetaminophen Swelling    SWELLING OF FACE AND THROAT  . Rosiglitazone Maleate Swelling    SWELLING OF FACE AND LEGS  . Xolair [Omalizumab] Other (See Comments)    Rash and anaphylaxis  .  Adhesive [Tape] Hives, Itching and Rash  . Morphine And Related Other (See Comments)    Causes hallucinations  . Cefaclor Rash  . Keflex [Cephalexin] Diarrhea and Rash  . Promethazine Hcl Other (See Comments)    IV ROUTE ONLY - JITTERY FEELING. Patient reports that it is mild and she has used promethazine since then  . Sulfadiazine Rash    BRIEF HPI:  See H&P, Labs, Consult and Test reports for all details in brief, patient is a 34 y.o. female with a history of type 1 diabetes mellitus , hidradenitis , that presented to the emergency department with complaints of worsening erythema, pain on her left thigh.  CONSULTATIONS:   general surgery  PERTINENT RADIOLOGIC STUDIES: No results  found.   PERTINENT LAB RESULTS: CBC:  Recent Labs  09/22/14 1716 09/24/14 0500  WBC 8.7 8.8  HGB 13.7 12.4  HCT 40.1 37.4  PLT 299 264   CMET CMP     Component Value Date/Time   NA 134* 09/24/2014 0500   K 4.2 09/24/2014 0500   CL 105 09/24/2014 0500   CO2 26 09/24/2014 0500   GLUCOSE 288* 09/24/2014 0500   BUN 11 09/24/2014 0500   CREATININE 0.70 09/24/2014 0500   CALCIUM 8.3* 09/24/2014 0500   CALCIUM 8.9 08/04/2008 2210   PROT 6.5 09/22/2014 1319   ALBUMIN 3.6 09/22/2014 1319   AST 14 09/22/2014 1319   ALT 14 09/22/2014 1319   ALKPHOS 86 09/22/2014 1319   BILITOT 0.8 09/22/2014 1319   GFRNONAA >90 09/24/2014 0500   GFRAA >90 09/24/2014 0500    GFR Estimated Creatinine Clearance: 119.8 mL/min (by C-G formula based on Cr of 0.7). No results for input(s): LIPASE, AMYLASE in the last 72 hours. No results for input(s): CKTOTAL, CKMB, CKMBINDEX, TROPONINI in the last 72 hours. Invalid input(s): POCBNP No results for input(s): DDIMER in the last 72 hours. No results for input(s): HGBA1C in the last 72 hours. No results for input(s): CHOL, HDL, LDLCALC, TRIG, CHOLHDL, LDLDIRECT in the last 72 hours. No results for input(s): TSH, T4TOTAL, T3FREE, THYROIDAB in the last 72 hours.  Invalid input(s): FREET3 No results for input(s): VITAMINB12, FOLATE, FERRITIN, TIBC, IRON, RETICCTPCT in the last 72 hours. Coags: No results for input(s): INR in the last 72 hours.  Invalid input(s): PT Microbiology: Recent Results (from the past 240 hour(s))  Culture, blood (routine x 2)     Status: None (Preliminary result)   Collection Time: 09/22/14  5:16 PM  Result Value Ref Range Status   Specimen Description BLOOD LEFT ARM  Final   Special Requests BOTTLES DRAWN AEROBIC AND ANAEROBIC 10CC  Final   Culture   Final           BLOOD CULTURE RECEIVED NO GROWTH TO DATE CULTURE WILL BE HELD FOR 5 DAYS BEFORE ISSUING A FINAL NEGATIVE REPORT Performed at [facility]     Report Status PENDING  Incomplete  Culture, blood (routine x 2)     Status: None (Preliminary result)   Collection Time: 09/22/14  5:31 PM  Result Value Ref Range Status   Specimen Description BLOOD LEFT ARM  Final   Special Requests   Final    BOTTLES DRAWN AEROBIC AND ANAEROBIC BLUE [place]   Culture   Final           BLOOD CULTURE RECEIVED NO GROWTH TO DATE CULTURE WILL BE HELD FOR 5 DAYS BEFORE ISSUING A FINAL NEGATIVE REPORT Performed at [facility]    Report Status PENDING  Incomplete  Anaerobic culture     Status: None (Preliminary result)   Collection Time: 09/23/14  3:31 PM  Result Value Ref Range Status   Specimen Description ABSCESS LEFT THIGH  Final   Special Requests PATIENT ON FOLLOWING VANCOMYCIN  Final   Gram Stain   Final    NO WBC SEEN RARE SQUAMOUS EPITHELIAL CELLS PRESENT NO ORGANISMS SEEN Performed at [facility]    Culture   Final    NO ANAEROBES ISOLATED; CULTURE IN PROGRESS FOR 5 DAYS Performed at [facility]    Report Status PENDING  Incomplete  Culture, routine-abscess     Status: None (Preliminary result)   Collection Time: 09/23/14  3:31 PM  Result Value Ref Range Status   Specimen Description ABSCESS LEFT THIGH  Final   Special Requests PATIENT ON FOLLOWING VANCOMYCIN  Final   Gram Stain   Final    NO WBC SEEN RARE SQUAMOUS EPITHELIAL CELLS PRESENT NO ORGANISMS SEEN Performed at [facility]   Final    Culture reincubated for better growth Performed at [facility]    Report Status PENDING  Incomplete     [place]:  Left thigh abscess with Cellulitis: Patient admitted and started on IV vancomycin, unfortunately no improvement with IV Abx, concerned area in left thigh with induration, highly suspicious of abscess. Gen Surgery consulted, subsequently underwent I&D ON 09/23/14. Much improved, area in left thigh with significantly less cellulitic changes and less  tender.Intraoperative cultures done on 1/2, and blood cultures done on 09/22/14 are negative so far at the time of discharge.Since improved, will switch to Doxycycline, and discharge home today. She is very well versed with wound care from prior wounds, she has a follow up appt with Gen Surgery on 10/10/14.  Active Problems:  Type 1 diabetes mellitus: Stable this admit, continue home insulin regimen on discharge.   TODAY-DAY OF DISCHARGE:  Subjective:   Clayborne Dana today has no headache,no chest abdominal pain,no new weakness tingling or numbness, feels much better wants to go home today.   Objective:   Blood pressure 91/54, pulse 76, temperature 98.2 F (36.8 C), temperature source Oral, resp. rate 18, weight 104.191 kg (229 lb 11.2 oz), last menstrual period 09/13/2014, SpO2 96 %.  Intake/Output Summary (Last 24 hours) at 09/25/14 1216 Last data filed at 09/25/14 0615  Gross per 24 hour  Intake   1330 ml  Output      0 ml  Net   1330 ml   Filed Weights   09/22/14 1706  Weight: 104.191 kg (229 lb 11.2 oz)    Exam Awake Alert, Oriented *3, No new F.N deficits, Normal affect [place].AT,PERRAL Supple Neck,No JVD, No cervical lymphadenopathy appriciated.  Symmetrical Chest wall movement, Good air movement bilaterally, CTAB RRR,No Gallops,Rubs or new Murmurs, No Parasternal Heave +ve B.Sounds, Abd Soft, Non tender, No organomegaly appriciated, No rebound -guarding or rigidity. No Cyanosis, Clubbing or edema, No new Rash or bruise  DISCHARGE CONDITION: Stable  DISPOSITION: Home  DISCHARGE INSTRUCTIONS:    Activity:  As tolerated   Diet recommendation: Diabetic Diet  Discharge Instructions    Call MD for:  redness, tenderness, or signs of infection (pain, swelling, redness, odor or green/yellow discharge around incision site)    Complete by:  As directed      Diet Carb Modified    Complete by:  As directed  Increase activity slowly    Complete by:  As directed              Follow-up Information    Follow up with [name], MD.   Specialty:  General Surgery   Why:  Keep your January 19 follow up with him and he can follow you as before.   Contact information:   [address] [place] [address] [phone]       Follow up with Vidal Schwalbe, MD. Schedule an appointment as soon as possible for a visit in 1 week.   Specialty:  Family Medicine   Contact information:   [address], [place] [address] [phone]         Total Time spent on discharge equals 25 minutes.  SignedOren Binet 09/25/2014 12:16 PM

## 2014-09-25 NOTE — Progress Notes (Signed)
Discharge instruction gave to pt and all question answered. Patient called her family and waited for her ride.

## 2014-09-28 ENCOUNTER — Other Ambulatory Visit (INDEPENDENT_AMBULATORY_CARE_PROVIDER_SITE_OTHER): Payer: Self-pay | Admitting: Surgery

## 2014-09-28 LAB — ANAEROBIC CULTURE: GRAM STAIN: NONE SEEN

## 2014-09-28 LAB — CULTURE, ROUTINE-ABSCESS: GRAM STAIN: NONE SEEN

## 2014-09-29 LAB — CULTURE, BLOOD (ROUTINE X 2)
CULTURE: NO GROWTH
CULTURE: NO GROWTH

## 2014-10-09 ENCOUNTER — Other Ambulatory Visit: Payer: Self-pay | Admitting: Internal Medicine

## 2014-10-09 DIAGNOSIS — Z889 Allergy status to unspecified drugs, medicaments and biological substances status: Secondary | ICD-10-CM

## 2014-10-09 DIAGNOSIS — R002 Palpitations: Secondary | ICD-10-CM

## 2014-10-10 ENCOUNTER — Other Ambulatory Visit (INDEPENDENT_AMBULATORY_CARE_PROVIDER_SITE_OTHER): Payer: Self-pay | Admitting: Surgery

## 2014-10-12 ENCOUNTER — Other Ambulatory Visit (INDEPENDENT_AMBULATORY_CARE_PROVIDER_SITE_OTHER): Payer: 59

## 2014-10-12 DIAGNOSIS — R002 Palpitations: Secondary | ICD-10-CM

## 2014-10-12 DIAGNOSIS — Z889 Allergy status to unspecified drugs, medicaments and biological substances status: Secondary | ICD-10-CM

## 2014-10-12 LAB — TSH: TSH: 1.64 u[IU]/mL (ref 0.35–4.50)

## 2014-10-13 LAB — ALLERGEN PENICILLIN G IGE: PENICILLIN G IGE ALLERGEN: <0.1 kU/L

## 2014-10-13 LAB — ALLERGEN PENICILLIN V (MINOR): Allergen Penicillin V (Minor): <0.1 kU/L

## 2014-10-17 ENCOUNTER — Telehealth: Payer: Self-pay | Admitting: Internal Medicine

## 2014-10-17 MED ORDER — PROMETHAZINE HCL 12.5 MG RE SUPP: 12.5000 mg | Freq: Four times a day (QID) | RECTAL | Status: DC | PRN

## 2014-10-17 MED ORDER — DIPHENOXYLATE-ATROPINE 2.5-0.025 MG PO TABS: 1.0000 | ORAL_TABLET | Freq: Four times a day (QID) | ORAL | Status: DC | PRN

## 2014-10-17 NOTE — Telephone Encounter (Signed)
Over night has developed N&V, diarrhea, upper back ache, can't keep anything down= hard to control sugars. Had reported daughter similar. Probably acute infectious gastroenteritis She agrees to phenergan supp and lomotil to get enough control that she can manage her sugar and fluids

## 2014-10-18 ENCOUNTER — Emergency Department (HOSPITAL_COMMUNITY)
Admission: EM | Admit: 2014-10-18 | Discharge: 2014-10-18 | Disposition: A | Discharge status: Home / Self Care | Payer: 59 | Attending: Emergency Medicine | Admitting: Emergency Medicine

## 2014-10-18 ENCOUNTER — Encounter (HOSPITAL_COMMUNITY): Payer: Self-pay | Admitting: *Deleted

## 2014-10-18 DIAGNOSIS — Z8701 Personal history of pneumonia (recurrent): Secondary | ICD-10-CM | POA: Insufficient documentation

## 2014-10-18 DIAGNOSIS — Z85828 Personal history of other malignant neoplasm of skin: Secondary | ICD-10-CM | POA: Diagnosis not present

## 2014-10-18 DIAGNOSIS — B349 Viral infection, unspecified: Secondary | ICD-10-CM | POA: Insufficient documentation

## 2014-10-18 DIAGNOSIS — Z792 Long term (current) use of antibiotics: Secondary | ICD-10-CM | POA: Diagnosis not present

## 2014-10-18 DIAGNOSIS — Z9889 Other specified postprocedural states: Secondary | ICD-10-CM | POA: Insufficient documentation

## 2014-10-18 DIAGNOSIS — Z87891 Personal history of nicotine dependence: Secondary | ICD-10-CM | POA: Insufficient documentation

## 2014-10-18 DIAGNOSIS — Z872 Personal history of diseases of the skin and subcutaneous tissue: Secondary | ICD-10-CM | POA: Insufficient documentation

## 2014-10-18 DIAGNOSIS — Z79899 Other long term (current) drug therapy: Secondary | ICD-10-CM | POA: Diagnosis not present

## 2014-10-18 DIAGNOSIS — R1084 Generalized abdominal pain: Secondary | ICD-10-CM | POA: Diagnosis present

## 2014-10-18 DIAGNOSIS — Z880 Allergy status to penicillin: Secondary | ICD-10-CM | POA: Insufficient documentation

## 2014-10-18 DIAGNOSIS — Z793 Long term (current) use of hormonal contraceptives: Secondary | ICD-10-CM | POA: Insufficient documentation

## 2014-10-18 DIAGNOSIS — Z91040 Latex allergy status: Secondary | ICD-10-CM | POA: Diagnosis not present

## 2014-10-18 DIAGNOSIS — Z8719 Personal history of other diseases of the digestive system: Secondary | ICD-10-CM | POA: Insufficient documentation

## 2014-10-18 DIAGNOSIS — R197 Diarrhea, unspecified: Secondary | ICD-10-CM

## 2014-10-18 DIAGNOSIS — Z794 Long term (current) use of insulin: Secondary | ICD-10-CM | POA: Diagnosis not present

## 2014-10-18 DIAGNOSIS — R011 Cardiac murmur, unspecified: Secondary | ICD-10-CM | POA: Insufficient documentation

## 2014-10-18 DIAGNOSIS — R112 Nausea with vomiting, unspecified: Secondary | ICD-10-CM

## 2014-10-18 DIAGNOSIS — Z8742 Personal history of other diseases of the female genital tract: Secondary | ICD-10-CM | POA: Insufficient documentation

## 2014-10-18 DIAGNOSIS — Z8679 Personal history of other diseases of the circulatory system: Secondary | ICD-10-CM | POA: Diagnosis not present

## 2014-10-18 DIAGNOSIS — Z8659 Personal history of other mental and behavioral disorders: Secondary | ICD-10-CM | POA: Insufficient documentation

## 2014-10-18 DIAGNOSIS — E119 Type 2 diabetes mellitus without complications: Secondary | ICD-10-CM | POA: Insufficient documentation

## 2014-10-18 DIAGNOSIS — J45909 Unspecified asthma, uncomplicated: Secondary | ICD-10-CM | POA: Insufficient documentation

## 2014-10-18 LAB — COMPREHENSIVE METABOLIC PANEL
ALBUMIN: 3.5 g/dL (ref 3.5–5.2)
ALT: 15 U/L (ref 0–35)
AST: 19 U/L (ref 0–37)
Alkaline Phosphatase: 76 U/L (ref 39–117)
Anion gap: 9 (ref 5–15)
BUN: 12 mg/dL (ref 6–23)
CALCIUM: 8.7 mg/dL (ref 8.4–10.5)
CO2: 25 mmol/L (ref 19–32)
CREATININE: 0.68 mg/dL (ref 0.50–1.10)
Chloride: 103 mmol/L (ref 96–112)
GFR calc Af Amer: >90 mL/min (ref >90)
GFR calc non Af Amer: >90 mL/min (ref >90)
Glucose, Bld: 214 mg/dL — ABNORMAL HIGH (ref 70–99)
POTASSIUM: 3.4 mmol/L — AB (ref 3.5–5.1)
SODIUM: 137 mmol/L (ref 135–145)
Total Bilirubin: 0.7 mg/dL (ref 0.3–1.2)
Total Protein: 6.5 g/dL (ref 6.0–8.3)

## 2014-10-18 LAB — CBC WITH DIFFERENTIAL/PLATELET
BASOS ABS: 0 10*3/uL (ref 0.0–0.1)
Basophils Relative: 0 % (ref 0–1)
Eosinophils Absolute: 0.2 10*3/uL (ref 0.0–0.7)
Eosinophils Relative: 3 % (ref 0–5)
HCT: 44.3 % (ref 36.0–46.0)
Hemoglobin: 15.1 g/dL — ABNORMAL HIGH (ref 12.0–15.0)
LYMPHS ABS: 2.1 10*3/uL (ref 0.7–4.0)
LYMPHS PCT: 46 % (ref 12–46)
MCH: 31.5 pg (ref 26.0–34.0)
MCHC: 34.1 g/dL (ref 30.0–36.0)
MCV: 92.5 fL (ref 78.0–100.0)
MONO ABS: 0.6 10*3/uL (ref 0.1–1.0)
Monocytes Relative: 12 % (ref 3–12)
NEUTROS ABS: 1.8 10*3/uL (ref 1.7–7.7)
Neutrophils Relative %: 39 % — ABNORMAL LOW (ref 43–77)
Platelets: 312 10*3/uL (ref 150–400)
RBC: 4.79 MIL/uL (ref 3.87–5.11)
RDW: 12.4 % (ref 11.5–15.5)
WBC: 4.7 10*3/uL (ref 4.0–10.5)

## 2014-10-18 LAB — CBG MONITORING, ED: GLUCOSE-CAPILLARY: 216 mg/dL — AB (ref 70–99)

## 2014-10-18 LAB — KETONES, QUALITATIVE: Acetone, Bld: NEGATIVE

## 2014-10-18 LAB — LIPASE, BLOOD: Lipase: 21 U/L (ref 11–59)

## 2014-10-18 MED ORDER — FENTANYL CITRATE 0.05 MG/ML IJ SOLN
100.0000 ug | Freq: Once | INTRAMUSCULAR | Status: AC
  Administered 2014-10-18: 100 ug via INTRAVENOUS
  Filled 2014-10-18: qty 2

## 2014-10-18 MED ORDER — MORPHINE SULFATE 4 MG/ML IJ SOLN
4.0000 mg | Freq: Once | INTRAMUSCULAR | Status: AC
  Filled 2014-10-18: qty 1

## 2014-10-18 MED ORDER — SODIUM CHLORIDE 0.9 % IV BOLUS (SEPSIS)
1000.0000 mL | Freq: Once | INTRAVENOUS | Status: AC
  Administered 2014-10-18: 1000 mL via INTRAVENOUS

## 2014-10-18 MED ORDER — ONDANSETRON HCL 4 MG/2ML IJ SOLN
4.0000 mg | Freq: Once | INTRAMUSCULAR | Status: AC
  Administered 2014-10-18: 4 mg via INTRAVENOUS
  Filled 2014-10-18: qty 2

## 2014-10-18 MED ORDER — LOPERAMIDE HCL 2 MG PO CAPS: 2.0000 mg | ORAL_CAPSULE | Freq: Four times a day (QID) | ORAL | Status: DC | PRN

## 2014-10-18 MED ORDER — ONDANSETRON 4 MG PO TBDP: ORAL_TABLET | ORAL | Status: DC

## 2014-10-18 NOTE — Discharge Instructions (Signed)
Take Zofran as directed as needed for nausea. Rest and stay well-hydrated. Take Imodium as directed as needed for diarrhea.  Abdominal Pain Many things can cause abdominal pain. Usually, abdominal pain is not caused by a disease and will improve without treatment. It can often be observed and treated at home. Your health care provider will do a physical exam and possibly order blood tests and X-rays to help determine the seriousness of your pain. However, in many cases, more time must pass before a clear cause of the pain can be found. Before that point, your health care provider may not know if you need more testing or further treatment. HOME CARE INSTRUCTIONS  Monitor your abdominal pain for any changes. The following actions may help to alleviate any discomfort you are experiencing:  Only take over-the-counter or prescription medicines as directed by your health care provider.  Do not take laxatives unless directed to do so by your health care provider.  Try a clear liquid diet (broth, tea, or water) as directed by your health care provider. Slowly move to a bland diet as tolerated. SEEK MEDICAL CARE IF:  You have unexplained abdominal pain.  You have abdominal pain associated with nausea or diarrhea.  You have pain when you urinate or have a bowel movement.  You experience abdominal pain that wakes you in the night.  You have abdominal pain that is worsened or improved by eating food.  You have abdominal pain that is worsened with eating fatty foods.  You have a fever. SEEK IMMEDIATE MEDICAL CARE IF:   Your pain does not go away within 2 hours.  You keep throwing up (vomiting).  Your pain is felt only in portions of the abdomen, such as the right side or the left lower portion of the abdomen.  You pass bloody or black tarry stools. MAKE SURE YOU:  Understand these instructions.   Will watch your condition.   Will get help right away if you are not doing well or get  worse.  Document Released: 06/18/2005 Document Revised: 09/13/2013 Document Reviewed: 05/18/2013 [facility] Patient Information 2015 [place], [place]. This information is not intended to replace advice given to you by your health care provider. Make sure you discuss any questions you have with your health care provider.  Diarrhea Diarrhea is frequent loose and watery bowel movements. It can cause you to feel weak and dehydrated. Dehydration can cause you to become tired and thirsty, have a dry mouth, and have decreased urination that often is dark yellow. Diarrhea is a sign of another problem, most often an infection that will not last long. In most cases, diarrhea typically lasts 2-3 days. However, it can last longer if it is a sign of something more serious. It is important to treat your diarrhea as directed by your caregiver to lessen or prevent future episodes of diarrhea. CAUSES  Some common causes include:  Gastrointestinal infections caused by viruses, bacteria, or parasites.  Food poisoning or food allergies.  Certain medicines, such as antibiotics, chemotherapy, and laxatives.  Artificial sweeteners and fructose.  Digestive disorders. HOME CARE INSTRUCTIONS  Ensure adequate fluid intake (hydration): Have 1 cup (8 oz) of fluid for each diarrhea episode. Avoid fluids that contain simple sugars or sports drinks, fruit juices, whole milk products, and sodas. Your urine should be clear or pale yellow if you are drinking enough fluids. Hydrate with an oral rehydration solution that you can purchase at pharmacies, retail stores, and online. You can prepare an oral rehydration  solution at home by mixing the following ingredients together:   - tsp table salt.   tsp baking soda.   tsp salt substitute containing potassium chloride.  1  tablespoons sugar.  1 L (34 oz) of water.  Certain foods and beverages may increase the speed at which food moves through the gastrointestinal (GI) tract.  These foods and beverages should be avoided and include:  Caffeinated and alcoholic beverages.  High-fiber foods, such as raw fruits and vegetables, nuts, seeds, and whole grain breads and cereals.  Foods and beverages sweetened with sugar alcohols, such as xylitol, sorbitol, and mannitol.  Some foods may be well tolerated and may help thicken stool including:  Starchy foods, such as rice, toast, pasta, low-sugar cereal, oatmeal, grits, baked potatoes, crackers, and bagels.  Bananas.  Applesauce.  Add probiotic-rich foods to help increase healthy bacteria in the GI tract, such as yogurt and fermented milk products.  Wash your hands well after each diarrhea episode.  Only take over-the-counter or prescription medicines as directed by your caregiver.  Take a warm bath to relieve any burning or pain from frequent diarrhea episodes. SEEK IMMEDIATE MEDICAL CARE IF:   You are unable to keep fluids down.  You have persistent vomiting.  You have blood in your stool, or your stools are black and tarry.  You do not urinate in 6-8 hours, or there is only a small amount of very dark urine.  You have abdominal pain that increases or localizes.  You have weakness, dizziness, confusion, or light-headedness.  You have a severe headache.  Your diarrhea gets worse or does not get better.  You have a fever or persistent symptoms for more than 2-3 days.  You have a fever and your symptoms suddenly get worse. MAKE SURE YOU:   Understand these instructions.  Will watch your condition.  Will get help right away if you are not doing well or get worse. Document Released: 08/29/2002 Document Revised: 01/23/2014 Document Reviewed: 05/16/2012 [facility] Patient Information 2015 [place], [place]. This information is not intended to replace advice given to you by your health care provider. Make sure you discuss any questions you have with your health care provider.  Nausea and Vomiting Nausea  is a sick feeling that often comes before throwing up (vomiting). Vomiting is a reflex where stomach contents come out of your mouth. Vomiting can cause severe loss of body fluids (dehydration). Children and elderly adults can become dehydrated quickly, especially if they also have diarrhea. Nausea and vomiting are symptoms of a condition or disease. It is important to find the cause of your symptoms. CAUSES   Direct irritation of the stomach lining. This irritation can result from increased acid production (gastroesophageal reflux disease), infection, food poisoning, taking certain medicines (such as nonsteroidal anti-inflammatory drugs), alcohol use, or tobacco use.  Signals from the brain.These signals could be caused by a headache, heat exposure, an inner ear disturbance, increased pressure in the brain from injury, infection, a tumor, or a concussion, pain, emotional stimulus, or metabolic problems.  An obstruction in the gastrointestinal tract (bowel obstruction).  Illnesses such as diabetes, hepatitis, gallbladder problems, appendicitis, kidney problems, cancer, sepsis, atypical symptoms of a heart attack, or eating disorders.  Medical treatments such as chemotherapy and radiation.  Receiving medicine that makes you sleep (general anesthetic) during surgery. DIAGNOSIS Your caregiver may ask for tests to be done if the problems do not improve after a few days. Tests may also be done if symptoms are severe or  if the reason for the nausea and vomiting is not clear. Tests may include:  Urine tests.  Blood tests.  Stool tests.  Cultures (to look for evidence of infection).  X-rays or other imaging studies. Test results can help your caregiver make decisions about treatment or the need for additional tests. TREATMENT You need to stay well hydrated. Drink frequently but in small amounts.You may wish to drink water, sports drinks, clear broth, or eat frozen ice pops or gelatin dessert to  help stay hydrated.When you eat, eating slowly may help prevent nausea.There are also some antinausea medicines that may help prevent nausea. HOME CARE INSTRUCTIONS   Take all medicine as directed by your caregiver.  If you do not have an appetite, do not force yourself to eat. However, you must continue to drink fluids.  If you have an appetite, eat a normal diet unless your caregiver tells you differently.  Eat a variety of complex carbohydrates (rice, wheat, potatoes, bread), lean meats, yogurt, fruits, and vegetables.  Avoid high-fat foods because they are more difficult to digest.  Drink enough water and fluids to keep your urine clear or pale yellow.  If you are dehydrated, ask your caregiver for specific rehydration instructions. Signs of dehydration may include:  Severe thirst.  Dry lips and mouth.  Dizziness.  Dark urine.  Decreasing urine frequency and amount.  Confusion.  Rapid breathing or pulse. SEEK IMMEDIATE MEDICAL CARE IF:   You have blood or brown flecks (like coffee grounds) in your vomit.  You have black or bloody stools.  You have a severe headache or stiff neck.  You are confused.  You have severe abdominal pain.  You have chest pain or trouble breathing.  You do not urinate at least once every 8 hours.  You develop cold or clammy skin.  You continue to vomit for longer than 24 to 48 hours.  You have a fever. MAKE SURE YOU:   Understand these instructions.  Will watch your condition.  Will get help right away if you are not doing well or get worse. Document Released: 09/08/2005 Document Revised: 12/01/2011 Document Reviewed: 02/05/2011 [facility] Patient Information 2015 [place], [place]. This information is not intended to replace advice given to you by your health care provider. Make sure you discuss any questions you have with your health care provider.   Viral Infections A viral infection can be caused by different types of  viruses.Most viral infections are not serious and resolve on their own. However, some infections may cause severe symptoms and may lead to further complications. SYMPTOMS Viruses can frequently cause:  Minor sore throat.  Aches and pains.  Headaches.  Runny nose.  Different types of rashes.  Watery eyes.  Tiredness.  Cough.  Loss of appetite.  Gastrointestinal infections, resulting in nausea, vomiting, and diarrhea. These symptoms do not respond to antibiotics because the infection is not caused by bacteria. However, you might catch a bacterial infection following the viral infection. This is sometimes called a "superinfection." Symptoms of such a bacterial infection may include:  Worsening sore throat with pus and difficulty swallowing.  Swollen neck glands.  Chills and a high or persistent fever.  Severe headache.  Tenderness over the sinuses.  Persistent overall ill feeling (malaise), muscle aches, and tiredness (fatigue).  Persistent cough.  Yellow, green, or brown mucus production with coughing. HOME CARE INSTRUCTIONS   Only take over-the-counter or prescription medicines for pain, discomfort, diarrhea, or fever as directed by your caregiver.  Drink enough  water and fluids to keep your urine clear or pale yellow. Sports drinks can provide valuable electrolytes, sugars, and hydration.  Get plenty of rest and maintain proper nutrition. Soups and broths with crackers or rice are fine. SEEK IMMEDIATE MEDICAL CARE IF:   You have severe headaches, shortness of breath, chest pain, neck pain, or an unusual rash.  You have uncontrolled vomiting, diarrhea, or you are unable to keep down fluids.  You or your child has an oral temperature above 102 F (38.9 C), not controlled by medicine.  Your baby is older than 3 months with a rectal temperature of 102 F (38.9 C) or higher.  Your baby is 29 months old or younger with a rectal temperature of 100.4 F (38 C) or  higher. MAKE SURE YOU:   Understand these instructions.  Will watch your condition.  Will get help right away if you are not doing well or get worse. Document Released: 06/18/2005 Document Revised: 12/01/2011 Document Reviewed: 01/13/2011 [facility] Patient Information 2015 [place], [place]. This information is not intended to replace advice given to you by your health care provider. Make sure you discuss any questions you have with your health care provider.

## 2014-10-18 NOTE — ED Notes (Signed)
Pt arrived from [facility] via [place]. Pt has had lower abdominal pain accompanied by N/V/D for the past 2 days. Pt states she has been unable to keep anything down x2 days.

## 2014-10-18 NOTE — ED Notes (Signed)
Pt notified before administration of morphine she was ordered morphine and during administration. Administration ended after 0.8mg  rt pt stating morphine makes her hallucinate. Hess, P.A. Notified. Pt requested dilaudid, which was denied rt being in the same family as morphine. Fentanyl ordered by Hess, P.A.

## 2014-10-18 NOTE — ED Notes (Signed)
Attempted I&O catheter with no urine output. Will administer 1L bolus of NS and retry.

## 2014-10-18 NOTE — ED Provider Notes (Signed)
CSN: [id number]     Arrival date & time 10/18/14  1330 History   First MD Initiated Contact with Patient 10/18/14 1331     Chief Complaint  Patient presents with  . Emesis  . Abdominal Pain     (Consider location/radiation/quality/duration/timing/severity/associated sxs/prior Treatment) HPI Comments: 34 y/o F with hx of T1DM, GERD, and anxiety presenting today via EMS from PCP for evaluation of abdominal pain, N/V/D ongoing for the past 2 days. Pt states she had gradual onset of diffuse abdominal pain starting Monday morning with associated nausea, non-bloody vomiting and diarrhea. She has been unable to keep down any food or fluids since Monday. She states her blood sugar levels have been normal for her, between 100-200, and she has been taking insulin as usual. PCP wanted her to be evaluated today due to increased ketones in her urine at the office. She denies HA, CP, SOB, hematemesis, polyuria, numbness and tingling. Denies polyuria or polydipsia. She endorses dry mouth.  Patient is a 34 y.o. female presenting with vomiting and abdominal pain. The history is provided by the patient.  Emesis Associated symptoms: abdominal pain and diarrhea   Abdominal Pain Associated symptoms: diarrhea, nausea and vomiting     Past Medical History  Diagnosis Date  . Hx MRSA infection   . Migraine   . Vitamin D insufficiency   . Asthma     as a child  . GERD (gastroesophageal reflux disease)     no current meds.  . Hidradenitis 04/2012    bilat. thighs, left groin - open areas on thighs  . Diabetes mellitus     IDDM, Insulin pump; followed by Dr. [name]  . Abscess of left axilla - PREVOTELLA BIVIA & Staph Coag Neg 07/12/2012    MODERATE PREVOTELLA BIVIA Note: BETA LACTAMASE NEGATIVE    . PCOS (polycystic ovarian syndrome)   . Heart murmur   . Pneumonia     hx  . Depression     hx  . Cancer     skin tag rt breast  . Hydradenitis   . Cellulitis 06/01/2014    RT INNER THIGH   Past  Surgical History  Procedure Laterality Date  . Wisdom tooth extraction    . Tonsillectomy    . Bunionectomy      left  . Esophagogastroduodenoscopy  11/17/2011    Procedure: ESOPHAGOGASTRODUODENOSCOPY (EGD);  Surgeon: [name], MD;  Location: [facility];  Service: Endoscopy;  Laterality: N/A;  . Eye surgery      exc. stye left eye  . Breast surgery      right lumpectomy  . Cholecystectomy  12/02/2006    lap. chole.  . Dilation and evacuation  02/14/2007  . Hydradenitis excision  04/30/2012    Procedure: EXCISION HYDRADENITIS GROIN;  Surgeon: [name], MD;  Location: [place];  Service: General;  Laterality: Left;  wide excision hidradenitis bilateral thighs and Left groin  . Hydradenitis excision Left 01/13/2014    Procedure: WIDE EXCISION HIDRADENITIS LEFT AXILLA;  Surgeon: [name], MD;  Location: [place];  Service: General;  Laterality: Left;  . Irrigation and debridement abscess Right 06/02/2014    Procedure: IRRIGATION AND DEBRIDEMENT ABSCESS;  Surgeon: [name], MD;  Location: [place];  Service: General;  Laterality: Right;  . Left heart catheterization with coronary angiogram N/A 07/14/2014    Procedure: LEFT HEART CATHETERIZATION WITH CORONARY ANGIOGRAM;  Surgeon: [name], MD;  Location: [facility] CATH LAB;  Service:  Cardiovascular;  Laterality: N/A;  . Cardiac catheterization    . Irrigation and debridement abscess Left 09/23/2014    Procedure: IRRIGATION AND DEBRIDEMENT ABSCESS/LEFT THIGH;  Surgeon: [name], MD;  Location[facility] OR;  Service: General;  Laterality: Left;   Family History  Problem Relation Age of Onset  . Hyperlipidemia Sister   . Hypertension Sister   . Hypertension Father    History  Substance Use Topics  . Smoking status: Former Smoker -- 0.50 packs/day for 14 years    Types: Cigarettes  . Smokeless tobacco: Never Used  . Alcohol Use: No   OB History    Gravida Para Term Preterm AB TAB SAB Ectopic  Multiple Living   4    3  3   1      Review of Systems  Gastrointestinal: Positive for nausea, vomiting, abdominal pain and diarrhea.  All other systems reviewed and are negative.     Allergies  Latex; Levofloxacin; Moxifloxacin; Oxycodone-acetaminophen; Peach; Penicillins; Potassium-containing compounds; Prednisone; Propoxyphene n-acetaminophen; Rosiglitazone maleate; Xolair; Adhesive; Morphine and related; Cefaclor; Keflex; Promethazine hcl; and Sulfadiazine  Home Medications   Prior to Admission medications   Medication Sig Start Date End Date Taking? Authorizing Provider  clindamycin (CLINDAGEL) 1 % gel Apply 1 application topically 2 (two) times daily as needed (boil / rash).     Historical Provider, MD  diphenoxylate-atropine (LOMOTIL) 2.5-0.025 MG per tablet Take 1 tablet by mouth 4 (four) times daily as needed for diarrhea or loose stools. 10/17/14   Deneise Lever, MD  doxycycline (VIBRAMYCIN) 50 MG capsule Take 2 capsules (100 mg total) by mouth 2 (two) times daily. 09/25/14   Shanker Kristeen Mans, MD  HYDROcodone-acetaminophen (NORCO/VICODIN) 5-325 MG per tablet Take 1-2 tablets by mouth every 6 (six) hours as needed for moderate pain. 09/25/14   Shanker Kristeen Mans, MD  insulin aspart (NOVOLOG) 100 UNIT/ML injection Inject 10-21 Units into the skin 3 (three) times daily before meals.     Historical Provider, MD  insulin glargine (LANTUS) 100 UNIT/ML injection Inject 50-55 Units into the skin every morning.  05/10/13   Charlynne Cousins, MD  levonorgestrel (MIRENA) 20 MCG/24HR IUD 1 each by Intrauterine route once.    Historical Provider, MD  loperamide (IMODIUM) 2 MG capsule Take 1 capsule (2 mg total) by mouth 4 (four) times daily as needed for diarrhea or loose stools. 10/18/14   Carman Ching, PA-C  mupirocin cream (BACTROBAN) 2 % Apply 1 application topically daily as needed (for boil/rash).  05/26/14   Historical Provider, MD  ondansetron (ZOFRAN ODT) 4 MG disintegrating tablet 4mg  ODT  q4 hours prn nausea/vomit 10/18/14   Ashon Rosenberg M Kadince Boxley, PA-C  promethazine (PHENERGAN) 12.5 MG suppository Place 1 suppository (12.5 mg total) rectally every 6 (six) hours as needed for nausea or vomiting. 10/17/14   Deneise Lever, MD   BP 101/65 mmHg  Pulse 76  Temp(Src) 97.9 F (36.6 C) (Oral)  Resp 15  Ht 5\' 5"  (1.651 m)  Wt 220 lb (99.791 kg)  BMI 36.61 kg/m2  SpO2 99%  LMP 09/13/2014 Physical Exam  Constitutional: She is oriented to person, place, and time. She appears well-developed and well-nourished. No distress.  HENT:  Head: Normocephalic and atraumatic.  Mouth/Throat: Oropharynx is clear and moist.  Eyes: Conjunctivae are normal.  Neck: Normal range of motion. Neck supple.  Cardiovascular: Normal rate, regular rhythm and normal heart sounds.   Pulmonary/Chest: Effort normal and breath sounds normal.  Abdominal: Soft. Bowel sounds are  normal.  Diffuse abdominal tenderness. No rigidity, guarding or rebound. No peritoneal signs.  Musculoskeletal: Normal range of motion. She exhibits no edema.  Neurological: She is alert and oriented to person, place, and time.  Skin: Skin is warm and dry. She is not diaphoretic.  Psychiatric: She has a normal mood and affect. Her behavior is normal.  Nursing note and vitals reviewed.   ED Course  Procedures (including critical care time) Labs Review Labs Reviewed  CBC WITH DIFFERENTIAL/PLATELET - Abnormal; Notable for the following:    Hemoglobin 15.1 (*)    Neutrophils Relative % 39 (*)    All other components within normal limits  COMPREHENSIVE METABOLIC PANEL - Abnormal; Notable for the following:    Potassium 3.4 (*)    Glucose, Bld 214 (*)    All other components within normal limits  CBG MONITORING, ED - Abnormal; Notable for the following:    Glucose-Capillary 216 (*)    All other components within normal limits  LIPASE, BLOOD  KETONES, QUALITATIVE  URINALYSIS, ROUTINE W REFLEX MICROSCOPIC  POC URINE PREG, ED    Imaging  Review No results found.   EKG Interpretation None      MDM   Final diagnoses:  Generalized abdominal pain  Non-intractable vomiting with nausea, vomiting of unspecified type  Diarrhea  Viral illness   Patient in no apparent distress. Vital signs stable. Afebrile. Abdomen is soft with generalized tenderness. No peritoneal signs. She does not appear dry. CBG 216. Labs without any acute finding. Normal anion gap, negative acetone. She is not in DKA. Most likely a viral illness given the abdominal pain, nausea, vomiting and diarrhea. After receiving IV fluids, pain and nausea medicine, she is feeling much better. Repeat abdominal exam significantly improved. She is stable for discharge. Advised her to eat a bland diet along with close monitoring of blood glucose. Follow-up with PCP. Return precautions given. Patient states understanding of treatment care plan and is agreeable.    Carman Ching, PA-C 10/18/14 1623  Debby Freiberg, MD 10/19/14 [phone]

## 2014-10-18 NOTE — ED Notes (Signed)
Pt is in stable condition upon d/c and ambulates independently from the ED. Pt verbalizes understanding rt d/c instructions and medications.

## 2014-10-18 NOTE — ED Notes (Signed)
CBG results 216 mg/dL

## 2014-10-19 LAB — ALLERGEN AMOXICILLIN: Amoxicillin IgE Class: 0

## 2014-10-23 ENCOUNTER — Other Ambulatory Visit: Payer: Self-pay | Admitting: Internal Medicine

## 2014-10-23 LAB — ALLERGEN CIPROFLOXACIN
ADJ COUNTS, ALLERGEN CIPROFLOXACIN: 0
Class: NEGATIVE

## 2014-10-23 LAB — ERTHYROMYCIN IGE: Erythromycin IgE: NEGATIVE

## 2014-10-23 LAB — SULFA IGE: SULFAMETHOXAZOLE IGE: NEGATIVE

## 2014-10-23 MED ORDER — AZITHROMYCIN 250 MG PO TABS: ORAL_TABLET | ORAL | Status: DC

## 2014-10-24 ENCOUNTER — Other Ambulatory Visit: Payer: Self-pay | Admitting: Internal Medicine

## 2014-10-24 MED ORDER — FLUCONAZOLE 150 MG PO TABS: ORAL_TABLET | ORAL | Status: DC

## 2014-11-17 ENCOUNTER — Other Ambulatory Visit: Payer: Self-pay | Admitting: Internal Medicine

## 2014-11-17 DIAGNOSIS — L732 Hidradenitis suppurativa: Secondary | ICD-10-CM

## 2014-12-12 ENCOUNTER — Ambulatory Visit (INDEPENDENT_AMBULATORY_CARE_PROVIDER_SITE_OTHER)
Admission: RE | Admit: 2014-12-12 | Discharge: 2014-12-12 | Disposition: A | Discharge status: Home / Self Care | Payer: 59 | Source: Ambulatory Visit | Attending: Internal Medicine | Admitting: Internal Medicine

## 2014-12-12 ENCOUNTER — Other Ambulatory Visit: Payer: Self-pay | Admitting: Internal Medicine

## 2014-12-12 DIAGNOSIS — J209 Acute bronchitis, unspecified: Secondary | ICD-10-CM

## 2014-12-13 ENCOUNTER — Telehealth: Payer: Self-pay | Admitting: Internal Medicine

## 2014-12-13 NOTE — Telephone Encounter (Signed)
She asked for CXR on 12/12/14 because of deep cough, malaise. Some fever, clear sputum. CXR- slight RML pneumonia. She wanted to wait on abx because of long list of abx intolerance (note IgE levels against common abx were not elevated recently). Today she reports worse, fever, cough green, pain through chest to back. She is taking doxycycline bid. She agrees to come tomorrow AM 3/23 for CBC, BMET, sputum cx, flu nasal swab. ER if any worse.

## 2014-12-14 ENCOUNTER — Other Ambulatory Visit: Payer: Self-pay | Admitting: *Deleted

## 2014-12-14 ENCOUNTER — Other Ambulatory Visit: Payer: Self-pay | Admitting: Internal Medicine

## 2014-12-14 ENCOUNTER — Other Ambulatory Visit (INDEPENDENT_AMBULATORY_CARE_PROVIDER_SITE_OTHER): Payer: 59

## 2014-12-14 ENCOUNTER — Ambulatory Visit (INDEPENDENT_AMBULATORY_CARE_PROVIDER_SITE_OTHER)
Admission: RE | Admit: 2014-12-14 | Discharge: 2014-12-14 | Disposition: A | Discharge status: Home / Self Care | Payer: 59 | Source: Ambulatory Visit | Attending: Internal Medicine | Admitting: Internal Medicine

## 2014-12-14 DIAGNOSIS — R05 Cough: Secondary | ICD-10-CM | POA: Diagnosis not present

## 2014-12-14 DIAGNOSIS — R059 Cough, unspecified: Secondary | ICD-10-CM

## 2014-12-14 DIAGNOSIS — E1310 Other specified diabetes mellitus with ketoacidosis without coma: Secondary | ICD-10-CM

## 2014-12-14 DIAGNOSIS — J189 Pneumonia, unspecified organism: Secondary | ICD-10-CM

## 2014-12-14 DIAGNOSIS — E1110 Type 2 diabetes mellitus with ketoacidosis without coma: Secondary | ICD-10-CM

## 2014-12-14 LAB — CBC WITH DIFFERENTIAL/PLATELET
BASOS PCT: 0.5 % (ref 0.0–3.0)
Basophils Absolute: 0 10*3/uL (ref 0.0–0.1)
EOS PCT: 3.3 % (ref 0.0–5.0)
Eosinophils Absolute: 0.2 10*3/uL (ref 0.0–0.7)
HCT: 41 % (ref 36.0–46.0)
Hemoglobin: 14.1 g/dL (ref 12.0–15.0)
LYMPHS PCT: 37.3 % (ref 12.0–46.0)
Lymphs Abs: 2.7 10*3/uL (ref 0.7–4.0)
MCHC: 34.5 g/dL (ref 30.0–36.0)
MCV: 90.9 fl (ref 78.0–100.0)
MONO ABS: 0.5 10*3/uL (ref 0.1–1.0)
Monocytes Relative: 6.6 % (ref 3.0–12.0)
NEUTROS PCT: 52.3 % (ref 43.0–77.0)
Neutro Abs: 3.7 10*3/uL (ref 1.4–7.7)
Platelets: 331 10*3/uL (ref 150.0–400.0)
RBC: 4.51 Mil/uL (ref 3.87–5.11)
RDW: 13.2 % (ref 11.5–15.5)
WBC: 7.1 10*3/uL (ref 4.0–10.5)

## 2014-12-18 ENCOUNTER — Other Ambulatory Visit: Payer: Self-pay

## 2014-12-18 ENCOUNTER — Other Ambulatory Visit (INDEPENDENT_AMBULATORY_CARE_PROVIDER_SITE_OTHER): Payer: 59

## 2014-12-18 ENCOUNTER — Ambulatory Visit (INDEPENDENT_AMBULATORY_CARE_PROVIDER_SITE_OTHER)
Admission: RE | Admit: 2014-12-18 | Discharge: 2014-12-18 | Disposition: A | Discharge status: Home / Self Care | Payer: 59 | Source: Ambulatory Visit | Attending: Internal Medicine | Admitting: Internal Medicine

## 2014-12-18 DIAGNOSIS — J189 Pneumonia, unspecified organism: Secondary | ICD-10-CM | POA: Diagnosis not present

## 2014-12-18 LAB — CBC WITH DIFFERENTIAL/PLATELET
Basophils Absolute: 0 10*3/uL (ref 0.0–0.1)
Basophils Relative: 0.5 % (ref 0.0–3.0)
EOS PCT: 2.5 % (ref 0.0–5.0)
Eosinophils Absolute: 0.2 10*3/uL (ref 0.0–0.7)
HCT: 42.2 % (ref 36.0–46.0)
Hemoglobin: 14.4 g/dL (ref 12.0–15.0)
Lymphocytes Relative: 32.4 % (ref 12.0–46.0)
Lymphs Abs: 2.4 10*3/uL (ref 0.7–4.0)
MCHC: 34.1 g/dL (ref 30.0–36.0)
MCV: 90.8 fl (ref 78.0–100.0)
MONO ABS: 0.4 10*3/uL (ref 0.1–1.0)
Monocytes Relative: 4.8 % (ref 3.0–12.0)
NEUTROS PCT: 59.8 % (ref 43.0–77.0)
Neutro Abs: 4.5 10*3/uL (ref 1.4–7.7)
Platelets: 321 10*3/uL (ref 150.0–400.0)
RBC: 4.65 Mil/uL (ref 3.87–5.11)
RDW: 13.1 % (ref 11.5–15.5)
WBC: 7.5 10*3/uL (ref 4.0–10.5)

## 2014-12-18 LAB — D-DIMER, QUANTITATIVE: D-Dimer, Quant: 0.35 ug/mL-FEU (ref 0.00–0.48)

## 2015-01-16 ENCOUNTER — Encounter: Payer: Self-pay | Admitting: Internal Medicine

## 2015-03-05 ENCOUNTER — Emergency Department (HOSPITAL_COMMUNITY)
Admission: EM | Admit: 2015-03-05 | Discharge: 2015-03-05 | Disposition: A | Discharge status: Home / Self Care | Payer: 59 | Attending: Emergency Medicine | Admitting: Emergency Medicine

## 2015-03-05 ENCOUNTER — Encounter (HOSPITAL_COMMUNITY): Payer: Self-pay | Admitting: *Deleted

## 2015-03-05 DIAGNOSIS — Z9889 Other specified postprocedural states: Secondary | ICD-10-CM | POA: Insufficient documentation

## 2015-03-05 DIAGNOSIS — Z87891 Personal history of nicotine dependence: Secondary | ICD-10-CM | POA: Insufficient documentation

## 2015-03-05 DIAGNOSIS — E1165 Type 2 diabetes mellitus with hyperglycemia: Secondary | ICD-10-CM | POA: Insufficient documentation

## 2015-03-05 DIAGNOSIS — Z8614 Personal history of Methicillin resistant Staphylococcus aureus infection: Secondary | ICD-10-CM | POA: Insufficient documentation

## 2015-03-05 DIAGNOSIS — Z794 Long term (current) use of insulin: Secondary | ICD-10-CM | POA: Insufficient documentation

## 2015-03-05 DIAGNOSIS — Z79899 Other long term (current) drug therapy: Secondary | ICD-10-CM | POA: Diagnosis not present

## 2015-03-05 DIAGNOSIS — Z8659 Personal history of other mental and behavioral disorders: Secondary | ICD-10-CM | POA: Insufficient documentation

## 2015-03-05 DIAGNOSIS — R739 Hyperglycemia, unspecified: Secondary | ICD-10-CM

## 2015-03-05 DIAGNOSIS — Z880 Allergy status to penicillin: Secondary | ICD-10-CM | POA: Diagnosis not present

## 2015-03-05 DIAGNOSIS — J45909 Unspecified asthma, uncomplicated: Secondary | ICD-10-CM | POA: Diagnosis not present

## 2015-03-05 DIAGNOSIS — Z91040 Latex allergy status: Secondary | ICD-10-CM | POA: Insufficient documentation

## 2015-03-05 DIAGNOSIS — Z792 Long term (current) use of antibiotics: Secondary | ICD-10-CM | POA: Insufficient documentation

## 2015-03-05 DIAGNOSIS — Z8701 Personal history of pneumonia (recurrent): Secondary | ICD-10-CM | POA: Insufficient documentation

## 2015-03-05 DIAGNOSIS — Z853 Personal history of malignant neoplasm of breast: Secondary | ICD-10-CM | POA: Diagnosis not present

## 2015-03-05 DIAGNOSIS — R011 Cardiac murmur, unspecified: Secondary | ICD-10-CM | POA: Diagnosis not present

## 2015-03-05 DIAGNOSIS — Z8719 Personal history of other diseases of the digestive system: Secondary | ICD-10-CM | POA: Diagnosis not present

## 2015-03-05 DIAGNOSIS — R112 Nausea with vomiting, unspecified: Secondary | ICD-10-CM | POA: Insufficient documentation

## 2015-03-05 DIAGNOSIS — Z872 Personal history of diseases of the skin and subcutaneous tissue: Secondary | ICD-10-CM | POA: Diagnosis not present

## 2015-03-05 DIAGNOSIS — Z8679 Personal history of other diseases of the circulatory system: Secondary | ICD-10-CM | POA: Diagnosis not present

## 2015-03-05 LAB — URINALYSIS, ROUTINE W REFLEX MICROSCOPIC
BILIRUBIN URINE: NEGATIVE
Glucose, UA: >1000 mg/dL — AB
HGB URINE DIPSTICK: NEGATIVE
Ketones, ur: NEGATIVE mg/dL
LEUKOCYTES UA: NEGATIVE
NITRITE: NEGATIVE
PH: 7.5 (ref 5.0–8.0)
Protein, ur: NEGATIVE mg/dL
Specific Gravity, Urine: 1.044 — ABNORMAL HIGH (ref 1.005–1.030)
UROBILINOGEN UA: 1 mg/dL (ref 0.0–1.0)

## 2015-03-05 LAB — CBC WITH DIFFERENTIAL/PLATELET
Basophils Absolute: 0.1 10*3/uL (ref 0.0–0.1)
Basophils Relative: 1 % (ref 0–1)
EOS PCT: 4 % (ref 0–5)
Eosinophils Absolute: 0.2 10*3/uL (ref 0.0–0.7)
HEMATOCRIT: 39.6 % (ref 36.0–46.0)
HEMOGLOBIN: 13.8 g/dL (ref 12.0–15.0)
Lymphocytes Relative: 38 % (ref 12–46)
Lymphs Abs: 2.5 10*3/uL (ref 0.7–4.0)
MCH: 31.5 pg (ref 26.0–34.0)
MCHC: 34.8 g/dL (ref 30.0–36.0)
MCV: 90.4 fL (ref 78.0–100.0)
MONOS PCT: 7 % (ref 3–12)
Monocytes Absolute: 0.4 10*3/uL (ref 0.1–1.0)
Neutro Abs: 3.3 10*3/uL (ref 1.7–7.7)
Neutrophils Relative %: 50 % (ref 43–77)
Platelets: 292 10*3/uL (ref 150–400)
RBC: 4.38 MIL/uL (ref 3.87–5.11)
RDW: 12.7 % (ref 11.5–15.5)
WBC: 6.5 10*3/uL (ref 4.0–10.5)

## 2015-03-05 LAB — CBG MONITORING, ED
GLUCOSE-CAPILLARY: 43 mg/dL — AB (ref 65–99)
GLUCOSE-CAPILLARY: 234 mg/dL — AB (ref 65–99)
Glucose-Capillary: 199 mg/dL — ABNORMAL HIGH (ref 65–99)

## 2015-03-05 LAB — URINE MICROSCOPIC-ADD ON

## 2015-03-05 LAB — BASIC METABOLIC PANEL
ANION GAP: 7 (ref 5–15)
BUN: 10 mg/dL (ref 6–20)
CALCIUM: 9.3 mg/dL (ref 8.9–10.3)
CO2: 19 mmol/L — AB (ref 22–32)
Chloride: 111 mmol/L (ref 101–111)
Creatinine, Ser: 0.68 mg/dL (ref 0.44–1.00)
GFR calc Af Amer: >60 mL/min (ref >60)
GFR calc non Af Amer: >60 mL/min (ref >60)
GLUCOSE: 189 mg/dL — AB (ref 65–99)
POTASSIUM: 4 mmol/L (ref 3.5–5.1)
SODIUM: 137 mmol/L (ref 135–145)

## 2015-03-05 LAB — I-STAT TROPONIN, ED: TROPONIN I, POC: 0.01 ng/mL (ref 0.00–0.08)

## 2015-03-05 MED ORDER — DEXTROSE 50 % IV SOLN
1.0000 | Freq: Once | INTRAVENOUS | Status: AC
  Administered 2015-03-05: 50 mL via INTRAVENOUS
  Filled 2015-03-05: qty 50

## 2015-03-05 MED ORDER — ONDANSETRON HCL 4 MG/2ML IJ SOLN
4.0000 mg | Freq: Once | INTRAMUSCULAR | Status: AC
  Administered 2015-03-05: 4 mg via INTRAVENOUS
  Filled 2015-03-05: qty 2

## 2015-03-05 MED ORDER — SODIUM CHLORIDE 0.9 % IV BOLUS (SEPSIS)
1000.0000 mL | Freq: Once | INTRAVENOUS | Status: AC
  Administered 2015-03-05: 1000 mL via INTRAVENOUS

## 2015-03-05 MED ORDER — METOCLOPRAMIDE HCL 5 MG/ML IJ SOLN
10.0000 mg | Freq: Once | INTRAMUSCULAR | Status: AC
  Administered 2015-03-05: 10 mg via INTRAVENOUS
  Filled 2015-03-05: qty 2

## 2015-03-05 MED ORDER — ONDANSETRON HCL 4 MG PO TABS: 4.0000 mg | ORAL_TABLET | Freq: Four times a day (QID) | ORAL | Status: DC

## 2015-03-05 NOTE — ED Notes (Signed)
Woke up this AM weak and nauseated, drove to work, can't remember how she got to work.  CBG 404 (normal less than 170).  C/O feet and hand crampy since this AM.  HX of DKA.  Pt feels sick to stomach, dizzy, lightheaded, nauseated.

## 2015-03-05 NOTE — ED Notes (Signed)
Pt sitting on side of bed eating [place] sandwich and orange juice. Family at bedside.

## 2015-03-05 NOTE — Discharge Instructions (Signed)
Return to the ED with any concerns including vomiting and not able to keep down liquids, abdominal pain, fever/chills, decreased level of alertness/lethargy, or any other alarming symptoms

## 2015-03-05 NOTE — ED Notes (Signed)
Notified RN of CBG 199

## 2015-03-05 NOTE — ED Provider Notes (Signed)
CSN: [id number]     Arrival date & time 03/05/15  0919 History   First MD Initiated Contact with Patient 03/05/15 [phone]     Chief Complaint  Patient presents with  . Hyperglycemia   HPI [name] is a 34yo woman with PMHx of type 1 DM with hx DKA, hidradenitis, and PCOS who presents to the ED with hyperglycemia. Patient reports she was not feeling well this morning as she had nausea, lightheadedness, and abdominal pain. She checked her blood sugar and it was 404. She reports she took her normal Lantus 50 units and then Novolog sliding scale of 14 units this morning. She reports she had some diarrhea last night. She states she had a recent flare up of her hidradenitis on her inner right thigh that has been draining fluid. She reports she saw her doctor at [facility] and he started her on Dapsone. She states she does not remember how she got to work this morning. She also describes cramping of her toes.     Past Medical History  Diagnosis Date  . Hx MRSA infection   . Migraine   . Vitamin D insufficiency   . Asthma     as a child  . GERD (gastroesophageal reflux disease)     no current meds.  . Hidradenitis 04/2012    bilat. thighs, left groin - open areas on thighs  . Diabetes mellitus     IDDM, Insulin pump; followed by Dr. [name]  . Abscess of left axilla - PREVOTELLA BIVIA & Staph Coag Neg 07/12/2012    MODERATE PREVOTELLA BIVIA Note: BETA LACTAMASE NEGATIVE    . PCOS (polycystic ovarian syndrome)   . Heart murmur   . Pneumonia     hx  . Depression     hx  . Cancer     skin tag rt breast  . Hydradenitis   . Cellulitis 06/01/2014    RT INNER THIGH   Past Surgical History  Procedure Laterality Date  . Wisdom tooth extraction    . Tonsillectomy    . Bunionectomy      left  . Esophagogastroduodenoscopy  11/17/2011    Procedure: ESOPHAGOGASTRODUODENOSCOPY (EGD);  Surgeon: [name], MD;  Location: [facility];  Service: Endoscopy;  Laterality: N/A;  . Eye  surgery      exc. stye left eye  . Breast surgery      right lumpectomy  . Cholecystectomy  12/02/2006    lap. chole.  . Dilation and evacuation  02/14/2007  . Hydradenitis excision  04/30/2012    Procedure: EXCISION HYDRADENITIS GROIN;  Surgeon: [name], MD;  Location: [facility];  Service: General;  Laterality: Left;  wide excision hidradenitis bilateral thighs and Left groin  . Hydradenitis excision Left 01/13/2014    Procedure: WIDE EXCISION HIDRADENITIS LEFT AXILLA;  Surgeon: [name], MD;  Location: [facility];  Service: General;  Laterality: Left;  . Irrigation and debridement abscess Right 06/02/2014    Procedure: IRRIGATION AND DEBRIDEMENT ABSCESS;  Surgeon: [name], MD;  Location: [place];  Service: General;  Laterality: Right;  . Left heart catheterization with coronary angiogram N/A 07/14/2014    Procedure: LEFT HEART CATHETERIZATION WITH CORONARY ANGIOGRAM;  Surgeon: [name], MD;  Location: [facility] CATH LAB;  Service: Cardiovascular;  Laterality: N/A;  . Cardiac catheterization    . Irrigation and debridement abscess Left 09/23/2014    Procedure: IRRIGATION AND DEBRIDEMENT ABSCESS/LEFT THIGH;  Surgeon: [name], MD;  Location: [facility];  Service: General;  Laterality: Left;   Family History  Problem Relation Age of Onset  . Hyperlipidemia Sister   . Hypertension Sister   . Hypertension Father    History  Substance Use Topics  . Smoking status: Former Smoker -- 0.50 packs/day for 14 years    Types: Cigarettes  . Smokeless tobacco: Never Used  . Alcohol Use: No   OB History    Gravida Para Term Preterm AB TAB SAB Ectopic Multiple Living   4    3  3   1      Review of Systems General: Denies fever, chills, night sweats, changes in weight HEENT: Denies headaches, ear pain, changes in vision, rhinorrhea, sore throat CV: Denies CP, palpitations, SOB, orthopnea Pulm: Denies SOB, cough, wheezing GI: Denies constipation, melena,  hematochezia GU: Denies dysuria, hematuria, frequency Msk: Denies joint pains Neuro: Denies weakness, numbness, tingling Skin: Denies bruising   Allergies  Latex; Levofloxacin; Moxifloxacin; Oxycodone-acetaminophen; Peach; Penicillins; Potassium-containing compounds; Prednisone; Propoxyphene n-acetaminophen; Rosiglitazone maleate; Xolair; Adhesive; Morphine and related; Cefaclor; Keflex; Promethazine hcl; and Sulfadiazine  Home Medications   Prior to Admission medications   Medication Sig Start Date End Date Taking? Authorizing Provider  azithromycin (ZITHROMAX) 250 MG tablet 1 twice daily x 7 days, then 1 tab three days per week 10/23/14   Deneise Lever, MD  clindamycin (CLINDAGEL) 1 % gel Apply 1 application topically 2 (two) times daily as needed (boil / rash).     Historical Provider, MD  diphenoxylate-atropine (LOMOTIL) 2.5-0.025 MG per tablet Take 1 tablet by mouth 4 (four) times daily as needed for diarrhea or loose stools. 10/17/14   Deneise Lever, MD  doxycycline (VIBRAMYCIN) 50 MG capsule Take 2 capsules (100 mg total) by mouth 2 (two) times daily. 09/25/14   Shanker Kristeen Mans, MD  fluconazole (DIFLUCAN) 150 MG tablet 1 daily 10/24/14   Deneise Lever, MD  HYDROcodone-acetaminophen (NORCO/VICODIN) 5-325 MG per tablet Take 1-2 tablets by mouth every 6 (six) hours as needed for moderate pain. 09/25/14   Shanker Kristeen Mans, MD  insulin aspart (NOVOLOG) 100 UNIT/ML injection Inject 10-21 Units into the skin 3 (three) times daily before meals.     Historical Provider, MD  insulin glargine (LANTUS) 100 UNIT/ML injection Inject 50-55 Units into the skin every morning.  05/10/13   Charlynne Cousins, MD  levonorgestrel (MIRENA) 20 MCG/24HR IUD 1 each by Intrauterine route once.    Historical Provider, MD  loperamide (IMODIUM) 2 MG capsule Take 1 capsule (2 mg total) by mouth 4 (four) times daily as needed for diarrhea or loose stools. 10/18/14   Carman Ching, PA-C  mupirocin cream (BACTROBAN) 2 %  Apply 1 application topically daily as needed (for boil/rash).  05/26/14   Historical Provider, MD  ondansetron (ZOFRAN ODT) 4 MG disintegrating tablet 4mg  ODT q4 hours prn nausea/vomit 10/18/14   Robyn M Hess, PA-C  promethazine (PHENERGAN) 12.5 MG suppository Place 1 suppository (12.5 mg total) rectally every 6 (six) hours as needed for nausea or vomiting. 10/17/14   Deneise Lever, MD   BP 115/76 mmHg  Pulse 82  Temp(Src) 97.7 F (36.5 C) (Oral)  Resp 21  Wt 220 lb (99.791 kg)  SpO2 97% Physical Exam General: young woman sitting up in bed, tearful, anxious  HEENT: [place]/AT, EOMI, sclera anicteric, mucus membranes dry CV: RRR, no m/g/r Pulm: CTA bilaterally, breaths non-labored Abd: BS+, soft, mild diffuse discomfort, non-distended Ext: warm, no edema. There is a  small lesion in the right groin that appears to be a hidradenitis lesion. No active drainage.  Neuro: alert and oriented x 3, no focal deficits  ED Course  Procedures (including critical care time) Labs Review Labs Reviewed  BASIC METABOLIC PANEL - Abnormal; Notable for the following:    CO2 19 (*)    Glucose, Bld 189 (*)    All other components within normal limits  CBG MONITORING, ED - Abnormal; Notable for the following:    Glucose-Capillary 199 (*)    All other components within normal limits  CBC WITH DIFFERENTIAL/PLATELET  URINALYSIS, ROUTINE W REFLEX MICROSCOPIC (NOT AT [facility])  I-STAT TROPOININ, ED    Imaging Review No results found.   EKG Interpretation   Date/Time:  Monday March 05 2015 09:33:18 EDT Ventricular Rate:  86 PR Interval:  141 QRS Duration: 75 QT Interval:  351 QTC Calculation: 420 R Axis:   53 Text Interpretation:  Sinus rhythm No significant change since last  tracing Confirmed by [facility]  MD, MARTHA [phone]) on 03/05/2015 10:53:01 AM      MDM   Final diagnoses:  Hyperglycemia  Non-intractable vomiting with nausea, vomiting of unspecified type    34yo woman with hx of type 1 DM  presenting with nausea, lightheadedness, and abdominal pain related to hyperglycemia. Concern for possible DKA. She treated herself with insulin at home, Lantus 50 units and Novolog 14 units. Blood sugar is down to 199. Will give IVF and Zofran. Will also check bmet, troponin, and EKG.   Bicarb mildly low at 19, but no AG present so patient is not in DKA. Troponin negative and EKG with sinus rhythm. WBC count normal making infection less likely for cause of hyperglycemia. However, patient does have new hidradenitis lesion that could be contributing to her hyperglycemia. Awaiting urine to determine if ketones present and rule out infection.   UA without ketones or infection. However, patient's CBG dropped to 43. She was given d50, [place] sandwich, and orange juice. Repeat CBG 234. Patient recommended to eat balanced meals and make sure to check sugars 4 times daily and take sliding scale with each meal. Patient recommended to call endocrinologist and see if appointment can be moved up from July.   Juliet Rude, MD 03/05/15 [place], MD 03/05/15 1459

## 2015-03-05 NOTE — ED Notes (Signed)
MD at bedside. 

## 2015-03-14 ENCOUNTER — Other Ambulatory Visit: Payer: Self-pay | Admitting: *Deleted

## 2015-03-15 ENCOUNTER — Other Ambulatory Visit: Payer: Self-pay | Admitting: *Deleted

## 2015-03-15 ENCOUNTER — Encounter: Payer: Self-pay | Admitting: *Deleted

## 2015-03-15 NOTE — Patient Outreach (Signed)
[name] sent this RNCM a secure e-mail stating that she will see her dermatologist at [facility] again on 05/16/15 and will also establish with the [facility] the same day. Barrington Ellison RN,CCM,CDE [place] Management Coordinator Link To Wellness Office Phone [phone] Office Fax [phone]

## 2015-03-15 NOTE — Patient Outreach (Signed)
[facility]) Care Management   03/14/15  [name] 12-15-1980 [id number]  [name] is an 34 y.o. female who presents for routine Link To Wellness follow up for self management assistance with Type I DM.  Subjective:  She says she was referred to a dermatologist at [facility] and will be seeing her long term for assistance with treating her hidradenitis. Since she will be going there for dermatology visits she says she would like to establish care with the [facility] and pursue getting back on the insulin pump.  Objective:   Review of Systems  Constitutional: Negative.     Physical Exam  Constitutional: She is oriented to person, place, and time. She appears well-developed and well-nourished.  Neurological: She is alert and oriented to person, place, and time.  Skin: Skin is warm and dry.  Psychiatric: She has a normal mood and affect. Her behavior is normal. Thought content normal.   Filed Vitals:   03/14/15 1330  BP: 94/84   Filed Weights   03/14/15 1330  Weight: 225 lb 6.4 oz (102.241 kg)   Current Medications:   Current Outpatient Prescriptions  Medication Sig Dispense Refill  . dapsone 25 MG tablet Take 75 mg by mouth daily.     . insulin aspart (NOVOLOG) 100 UNIT/ML injection Inject 10-21 Units into the skin 3 (three) times daily before meals.     . insulin glargine (LANTUS) 100 UNIT/ML injection Inject 50-55 Units into the skin every morning.     Marland Kitchen levonorgestrel (MIRENA) 20 MCG/24HR IUD 1 each by Intrauterine route once.    . ondansetron (ZOFRAN) 4 MG tablet Take 1 tablet (4 mg total) by mouth every 6 (six) hours. 12 tablet 0  . spironolactone (ALDACTONE) 50 MG tablet Take 50 mg by mouth 2 (two) times daily.      No current facility-administered medications for this visit.    Functional Status:   In your present state of health, do you have any difficulty performing the following activities: 09/23/2014  09/23/2014  Hearing? - N  Vision? - N  Difficulty concentrating or making decisions? - N  Walking or climbing stairs? - N  Dressing or bathing? - N  Doing errands, shopping? N -   Depression screen [facility] 2/9 03/14/2015  Decreased Interest 1  Down, Depressed, Hopeless 0  PHQ - 2 Score 1   THN CM Care Plan Problem One        Patient Outreach from 03/14/2015 in [place] Problem One  Type I not meeting A1C target of <7.0%   Care Plan for Problem One  Active   THN Long Term Goal (31-90 days)  Improved glycemic control as evidenced by improved A1C   THN Long Term Goal Start Date  03/14/15   Interventions for Problem One Long Term Goal  since [name] is interested in going back on the insulin pump, we discussed the Link To Wellness benefits related to the pump, continuous glucose monitor and glucometer, encouraged her to get established with the [facility] in [place] as soon as possible and have them fax the notes to this [facility], will arrange for Link To Wellness follow up once [name] has met with the providers at the [facility]      Assessment:   [employer] employee at [place] member with Type I DM not meeting target A1C.  Plan:  Assist [place] with  resuming insulin pump with the goal of improved glycemic control. RNCM will meet at least quarterly and as needed with patient per Link To Wellness program guidelines to assist with Type I DM self-management and assess patient's progress toward mutually set goals.  Barrington Ellison RN,CCM,CDE [place] Management Coordinator Link To Wellness Office Phone [phone] Office Fax [phone]

## 2015-03-19 ENCOUNTER — Other Ambulatory Visit: Payer: Self-pay

## 2015-05-16 ENCOUNTER — Encounter: Payer: Self-pay | Admitting: *Deleted

## 2015-05-18 NOTE — Patient Outreach (Addendum)
Received the following email from [place]: I have GREAT news...Marland KitchenMarland Kitchenmy ABC is down!!!!!! 7.5 and we are getting started with going back on the insulin pump. I cried when he told me my lab results.  So I have decided on the Animas vibe pump with cgm. I spoke with the rep about questions I had. I am sending her my information tomorrow and will let dr Hermelinda Dellen at [facility] know.  Thanks ??    This RNCM notified [facility] to notify [facility] that [place] will be starting insulin pump and continuous glucose monitoring therapy.  This RNCM has requested updates from [facility] including the date she starts the pump and continuous glucose monitoring therapy.  Barrington Ellison RN,CCM,CDE [place] Management Coordinator Link To Wellness Office Phone [phone] Office Fax [phone]

## 2015-08-17 ENCOUNTER — Ambulatory Visit (INDEPENDENT_AMBULATORY_CARE_PROVIDER_SITE_OTHER)
Admission: RE | Admit: 2015-08-17 | Discharge: 2015-08-17 | Disposition: A | Discharge status: Home / Self Care | Payer: 59 | Source: Ambulatory Visit | Attending: Internal Medicine | Admitting: Internal Medicine

## 2015-08-17 ENCOUNTER — Telehealth: Payer: Self-pay | Admitting: Internal Medicine

## 2015-08-17 ENCOUNTER — Other Ambulatory Visit: Payer: Self-pay

## 2015-08-17 DIAGNOSIS — J209 Acute bronchitis, unspecified: Secondary | ICD-10-CM

## 2015-08-17 MED ORDER — AMOXICILLIN-POT CLAVULANATE 875-125 MG PO TABS: 1.0000 | ORAL_TABLET | Freq: Two times a day (BID) | ORAL | Status: DC

## 2015-08-17 NOTE — Telephone Encounter (Signed)
Significant acute bronchitis Was IgE negative for penicillins Willing to try augmentin- ordered at [facility]

## 2015-10-11 ENCOUNTER — Other Ambulatory Visit: Payer: Self-pay | Admitting: Internal Medicine

## 2015-10-11 DIAGNOSIS — R002 Palpitations: Secondary | ICD-10-CM

## 2015-10-11 DIAGNOSIS — R0789 Other chest pain: Secondary | ICD-10-CM

## 2015-10-12 ENCOUNTER — Emergency Department (HOSPITAL_COMMUNITY): Payer: 59

## 2015-10-12 ENCOUNTER — Emergency Department (HOSPITAL_COMMUNITY)
Admission: EM | Admit: 2015-10-12 | Discharge: 2015-10-12 | Disposition: A | Discharge status: Home / Self Care | Payer: 59 | Attending: Emergency Medicine | Admitting: Emergency Medicine

## 2015-10-12 ENCOUNTER — Encounter (HOSPITAL_COMMUNITY): Payer: Self-pay | Admitting: Emergency Medicine

## 2015-10-12 ENCOUNTER — Other Ambulatory Visit: Payer: Self-pay

## 2015-10-12 DIAGNOSIS — Z8679 Personal history of other diseases of the circulatory system: Secondary | ICD-10-CM | POA: Insufficient documentation

## 2015-10-12 DIAGNOSIS — Z794 Long term (current) use of insulin: Secondary | ICD-10-CM | POA: Insufficient documentation

## 2015-10-12 DIAGNOSIS — Z880 Allergy status to penicillin: Secondary | ICD-10-CM | POA: Insufficient documentation

## 2015-10-12 DIAGNOSIS — Z853 Personal history of malignant neoplasm of breast: Secondary | ICD-10-CM | POA: Insufficient documentation

## 2015-10-12 DIAGNOSIS — Z8614 Personal history of Methicillin resistant Staphylococcus aureus infection: Secondary | ICD-10-CM | POA: Insufficient documentation

## 2015-10-12 DIAGNOSIS — F329 Major depressive disorder, single episode, unspecified: Secondary | ICD-10-CM | POA: Diagnosis not present

## 2015-10-12 DIAGNOSIS — Z8701 Personal history of pneumonia (recurrent): Secondary | ICD-10-CM | POA: Diagnosis not present

## 2015-10-12 DIAGNOSIS — R079 Chest pain, unspecified: Secondary | ICD-10-CM | POA: Diagnosis not present

## 2015-10-12 DIAGNOSIS — Z79899 Other long term (current) drug therapy: Secondary | ICD-10-CM | POA: Insufficient documentation

## 2015-10-12 DIAGNOSIS — F17210 Nicotine dependence, cigarettes, uncomplicated: Secondary | ICD-10-CM | POA: Insufficient documentation

## 2015-10-12 DIAGNOSIS — R739 Hyperglycemia, unspecified: Secondary | ICD-10-CM

## 2015-10-12 DIAGNOSIS — J45909 Unspecified asthma, uncomplicated: Secondary | ICD-10-CM | POA: Diagnosis not present

## 2015-10-12 DIAGNOSIS — Z91040 Latex allergy status: Secondary | ICD-10-CM | POA: Diagnosis not present

## 2015-10-12 DIAGNOSIS — E1165 Type 2 diabetes mellitus with hyperglycemia: Secondary | ICD-10-CM | POA: Insufficient documentation

## 2015-10-12 DIAGNOSIS — R011 Cardiac murmur, unspecified: Secondary | ICD-10-CM | POA: Insufficient documentation

## 2015-10-12 LAB — BASIC METABOLIC PANEL
Anion gap: 11 (ref 5–15)
BUN: 12 mg/dL (ref 6–20)
CALCIUM: 9.8 mg/dL (ref 8.9–10.3)
CO2: 22 mmol/L (ref 22–32)
CREATININE: 0.92 mg/dL (ref 0.44–1.00)
Chloride: 102 mmol/L (ref 101–111)
GFR calc non Af Amer: >60 mL/min (ref >60)
Glucose, Bld: 473 mg/dL — ABNORMAL HIGH (ref 65–99)
Potassium: 4.1 mmol/L (ref 3.5–5.1)
SODIUM: 135 mmol/L (ref 135–145)

## 2015-10-12 LAB — CBC
HCT: 40.6 % (ref 36.0–46.0)
Hemoglobin: 13.5 g/dL (ref 12.0–15.0)
MCH: 32.4 pg (ref 26.0–34.0)
MCHC: 33.3 g/dL (ref 30.0–36.0)
MCV: 97.4 fL (ref 78.0–100.0)
PLATELETS: 322 10*3/uL (ref 150–400)
RBC: 4.17 MIL/uL (ref 3.87–5.11)
RDW: 12.8 % (ref 11.5–15.5)
WBC: 9.1 10*3/uL (ref 4.0–10.5)

## 2015-10-12 LAB — CBG MONITORING, ED: GLUCOSE-CAPILLARY: 169 mg/dL — AB (ref 65–99)

## 2015-10-12 LAB — I-STAT TROPONIN, ED
TROPONIN I, POC: 0 ng/mL (ref 0.00–0.08)
TROPONIN I, POC: 0.01 ng/mL (ref 0.00–0.08)

## 2015-10-12 MED ORDER — SODIUM CHLORIDE 0.9 % IV BOLUS (SEPSIS)
2000.0000 mL | Freq: Once | INTRAVENOUS | Status: AC
  Administered 2015-10-12: 2000 mL via INTRAVENOUS

## 2015-10-12 MED ORDER — INSULIN ASPART 100 UNIT/ML ~~LOC~~ SOLN
10.0000 [IU] | Freq: Once | SUBCUTANEOUS | Status: DC
  Filled 2015-10-12: qty 1

## 2015-10-12 MED ORDER — ONDANSETRON HCL 4 MG/2ML IJ SOLN
4.0000 mg | Freq: Once | INTRAMUSCULAR | Status: AC
  Administered 2015-10-12: 4 mg via INTRAVENOUS
  Filled 2015-10-12: qty 2

## 2015-10-12 NOTE — ED Notes (Signed)
Pt given a bag meal and a sprite zero

## 2015-10-12 NOTE — Discharge Instructions (Signed)
Please read and follow all provided instructions.  Your diagnoses today include:  1. Hyperglycemia     Tests performed today include:  Vital signs. See below for your results today.   Medications prescribed:   None   Home care instructions:  Follow any educational materials contained in this packet.  Follow-up instructions: Please follow-up with your primary care provider in the next 48 hours for further evaluation of symptoms and treatment   Return instructions:   Please return to the Emergency Department if you do not get better, if you get worse, or new symptoms OR  - Fever (temperature greater than 101.15F)  - Bleeding that does not stop with holding pressure to the area    -Severe pain (please note that you may be more sore the day after your accident)  - Chest Pain  - Difficulty breathing  - Severe nausea or vomiting  - Inability to tolerate food and liquids  - Passing out  - Skin becoming red around your wounds  - Change in mental status (confusion or lethargy)  - New numbness or weakness     Please return if you have any other emergent concerns.  Additional Information:  Your vital signs today were: BP 105/78 mmHg   Pulse 89   Temp(Src) 98.2 F (36.8 C) (Oral)   Resp 19   SpO2 96% If your blood pressure (BP) was elevated above 135/85 this visit, please have this repeated by your doctor within one month. ---------------

## 2015-10-12 NOTE — ED Notes (Signed)
Patient states L chest pain with radiation to left arm that started yesterday.   Patient states had episode of chest pain yesterday with shortness of breath that lasted 20 minutes.   Patient states pain was intermittent.   Patient states this morning she had the same.   Has had some nausea, but no vomiting.   Patient denies other symptoms.

## 2015-10-12 NOTE — ED Provider Notes (Signed)
CSN: [id number]     Arrival date & time 10/12/15  [id number] History   First MD Initiated Contact with Patient 10/12/15 1212     Chief Complaint  Patient presents with  . Chest Pain   (Consider location/radiation/quality/duration/timing/severity/associated sxs/prior Treatment) HPI 35 y.o. female with a hx of DM, Hidradenitis, presents to the Emergency Department today complaining of left sided chest pain that radiates to the L arm since morning yesterday. Pain is intermittent at bouts of 15-20 minutes in length. 9/10 on pain scale. Has not tried any treatments. Pain went away once she went to work yesterday, but return this morning with same symptoms. Measured HR at work and took EKG at [employer] office and it was 140bpm. Reports bilateral calf tenderness for the past 2-3 days as well. Having N, shortness of breath, dizziness, headache. She is a smoker. No hx DVT/PE, no recent surgeries, she is active, has Mirena device.   Past Medical History  Diagnosis Date  . Hx MRSA infection   . Migraine   . Vitamin D insufficiency   . Asthma     as a child  . GERD (gastroesophageal reflux disease)     no current meds.  . Hidradenitis 04/2012    bilat. thighs, left groin - open areas on thighs  . Diabetes mellitus     IDDM, Insulin pump; followed by Dr. [name]  . Abscess of left axilla - PREVOTELLA BIVIA & Staph Coag Neg 07/12/2012    MODERATE PREVOTELLA BIVIA Note: BETA LACTAMASE NEGATIVE    . PCOS (polycystic ovarian syndrome)   . Heart murmur   . Pneumonia     hx  . Depression     hx  . Cancer ([place])     skin tag rt breast  . Hydradenitis   . Cellulitis 06/01/2014    RT INNER THIGH   Past Surgical History  Procedure Laterality Date  . Wisdom tooth extraction    . Tonsillectomy    . Bunionectomy      left  . Esophagogastroduodenoscopy  11/17/2011    Procedure: ESOPHAGOGASTRODUODENOSCOPY (EGD);  Surgeon: [name], MD;  Location: [facility];  Service: Endoscopy;   Laterality: N/A;  . Eye surgery      exc. stye left eye  . Breast surgery      right lumpectomy  . Cholecystectomy  12/02/2006    lap. chole.  . Dilation and evacuation  02/14/2007  . Hydradenitis excision  04/30/2012    Procedure: EXCISION HYDRADENITIS GROIN;  Surgeon: [name], MD;  Location: [facility];  Service: General;  Laterality: Left;  wide excision hidradenitis bilateral thighs and Left groin  . Hydradenitis excision Left 01/13/2014    Procedure: WIDE EXCISION HIDRADENITIS LEFT AXILLA;  Surgeon: [name], MD;  Location: [facility];  Service: General;  Laterality: Left;  . Irrigation and debridement abscess Right 06/02/2014    Procedure: IRRIGATION AND DEBRIDEMENT ABSCESS;  Surgeon: [name], MD;  Location: [facility];  Service: General;  Laterality: Right;  . Left heart catheterization with coronary angiogram N/A 07/14/2014    Procedure: LEFT HEART CATHETERIZATION WITH CORONARY ANGIOGRAM;  Surgeon: [name], MD;  Location: [facility] CATH LAB;  Service: Cardiovascular;  Laterality: N/A;  . Cardiac catheterization    . Irrigation and debridement abscess Left 09/23/2014    Procedure: IRRIGATION AND DEBRIDEMENT ABSCESS/LEFT THIGH;  Surgeon: [name], MD;  Location: [facility];  Service: General;  Laterality: Left;   Family History  Problem Relation  Age of Onset  . Hyperlipidemia Sister   . Hypertension Sister   . Hypertension Father    Social History  Substance Use Topics  . Smoking status: Current Every Day Smoker -- 0.50 packs/day for 14 years    Types: Cigarettes  . Smokeless tobacco: Never Used  . Alcohol Use: No   OB History    Gravida Para Term Preterm AB TAB SAB Ectopic Multiple Living   4    3  3   1      Review of Systems 10 Systems reviewed and all are negative for acute change except as noted in the HPI.  Allergies  Latex; Levofloxacin; Moxifloxacin; Oxycodone-acetaminophen; Peach; Penicillins; Potassium-containing compounds;  Prednisone; Propoxyphene n-acetaminophen; Rosiglitazone maleate; Xolair; Adhesive; Morphine and related; Cefaclor; Keflex; Promethazine hcl; and Sulfadiazine  Home Medications   Prior to Admission medications   Medication Sig Start Date End Date Taking? Authorizing Provider  amoxicillin-clavulanate (AUGMENTIN) 875-125 MG tablet Take 1 tablet by mouth 2 (two) times daily. 08/17/15   Deneise Lever, MD  dapsone 25 MG tablet Take 75 mg by mouth daily.     Historical Provider, MD  insulin aspart (NOVOLOG) 100 UNIT/ML injection Inject 10-21 Units into the skin 3 (three) times daily before meals.     Historical Provider, MD  insulin glargine (LANTUS) 100 UNIT/ML injection Inject 50-55 Units into the skin every morning.  05/10/13   Charlynne Cousins, MD  levonorgestrel (MIRENA) 20 MCG/24HR IUD 1 each by Intrauterine route once.    Historical Provider, MD  ondansetron (ZOFRAN) 4 MG tablet Take 1 tablet (4 mg total) by mouth every 6 (six) hours. 03/05/15   Alfonzo Beers, MD  spironolactone (ALDACTONE) 50 MG tablet Take 50 mg by mouth 2 (two) times daily.     Historical Provider, MD   BP 111/85 mmHg  Pulse 114  Temp(Src) 98.2 F (36.8 C) (Oral)  Resp 18  SpO2 96%   Physical Exam  Constitutional: She is oriented to person, place, and time. She appears well-developed and well-nourished.  HENT:  Head: Normocephalic and atraumatic.  Eyes: EOM are normal.  Neck: Normal range of motion. Neck supple.  Cardiovascular: Normal rate, regular rhythm and normal heart sounds.   Pulmonary/Chest: Effort normal and breath sounds normal.  Abdominal: Soft.  Musculoskeletal: Normal range of motion.  Bilateral tenderness on palpation of posterior calf. Pain with dorsiflexion bilaterally.   Neurological: She is alert and oriented to person, place, and time.  Skin: Skin is warm and dry.  Psychiatric: She has a normal mood and affect. Her behavior is normal. Thought content normal.    ED Course  Procedures  (including critical care time) Labs Review Labs Reviewed  BASIC METABOLIC PANEL - Abnormal; Notable for the following:    Glucose, Bld 473 (*)    All other components within normal limits  CBG MONITORING, ED - Abnormal; Notable for the following:    Glucose-Capillary 169 (*)    All other components within normal limits  CBC  I-STAT TROPOININ, ED  I-STAT TROPOININ, ED   Imaging Review Dg Chest 2 View  10/12/2015  CLINICAL DATA:  Chest pain radiating into the left arm beginning this morning. No known injury. Initial encounter. EXAM: CHEST  2 VIEW COMPARISON:  PA and lateral chest 08/17/2015, 07/02/2015 and 06/28/2014. FINDINGS: The lungs are clear. Heart size is normal. No pneumothorax or pleural effusion. Cholecystectomy clips noted. IMPRESSION: Negative chest. Electronically Signed   By: Inge Rise M.D.   On: 10/12/2015 10:45  I have personally reviewed and evaluated these images and lab results as part of my medical decision-making.   EKG Interpretation   Date/Time:  Friday October 12 2015 09:46:51 EST Ventricular Rate:  113 PR Interval:  130 QRS Duration: 86 QT Interval:  302 QTC Calculation: 414 R Axis:   67 Text Interpretation:  Sinus tachycardia Since last tracing rate faster  Otherwise no significant change Confirmed by KNOTT MD, DANIEL [id number]) on  10/12/2015 1:20:22 PM      MDM  I have reviewed relevant laboratory values..I have reviewed the relevant previous healthcare records.I obtained HPI from historian. Patient discussed with supervising physician  ED Course:  Assessment: 34y F hx DM presents with CP for the past 2 days. Pain is intermittent and radiates down L arm. Heart Score 2. Trop neg x2. EKG neg.CBG 473. Given 2L NS and 10 U Insulin. Most likely tachycardia due to elevation in glucose and will dc home due to neg troponin and unremarkable EKG. Negative Cath in 2015. Suspicion low for DVT/PE based on Wells Criteria. Give 2L NS bolus. On reassessment she  is no longer tachycardic, BP stable. Glucose 169. Will have her follow up with PCP for further management of symptoms.   Patient is in no acute distress. Vital Signs are stable. Patient is able to ambulate. Patient able to tolerate PO.   Disposition/Plan:  DC Home Additional Verbal discharge instructions given and discussed with patient.  Pt Instructed to f/u with PCP in the next 48 hours for evaluation and treatment of symptoms. Return precautions given Pt acknowledges and agrees with plan  Supervising Physician Leo Grosser, MD   Final diagnoses:  Hyperglycemia      Shary Decamp, PA-C 10/12/15 1525  Leo Grosser, MD 10/12/15 [phone]

## 2015-10-17 DIAGNOSIS — Z87410 Personal history of cervical dysplasia: Secondary | ICD-10-CM | POA: Diagnosis not present

## 2015-10-17 DIAGNOSIS — Z124 Encounter for screening for malignant neoplasm of cervix: Secondary | ICD-10-CM | POA: Diagnosis not present

## 2015-10-18 DIAGNOSIS — Z124 Encounter for screening for malignant neoplasm of cervix: Secondary | ICD-10-CM | POA: Diagnosis not present

## 2015-10-30 ENCOUNTER — Telehealth: Payer: Self-pay | Admitting: Internal Medicine

## 2015-10-30 MED ORDER — AMOXICILLIN-POT CLAVULANATE 875-125 MG PO TABS: 1.0000 | ORAL_TABLET | Freq: Two times a day (BID) | ORAL | Status: DC

## 2015-10-30 NOTE — Telephone Encounter (Signed)
Green nasal discharge, frontal and maxillary pressure bilaterally, postnasal drip Plan- augmentin

## 2015-11-16 ENCOUNTER — Telehealth: Payer: Self-pay | Admitting: Internal Medicine

## 2015-11-16 MED ORDER — DOXYCYCLINE HYCLATE 100 MG PO TABS: 100.0000 mg | ORAL_TABLET | Freq: Two times a day (BID) | ORAL | Status: DC

## 2015-11-16 MED ORDER — FLUCONAZOLE 150 MG PO TABS: 150.0000 mg | ORAL_TABLET | Freq: Every day | ORAL | Status: DC

## 2015-11-16 NOTE — Telephone Encounter (Signed)
Hydradenitis flare Rx doxycycline, diflucan

## 2015-11-27 ENCOUNTER — Other Ambulatory Visit: Payer: Self-pay | Admitting: *Deleted

## 2015-11-27 DIAGNOSIS — F17200 Nicotine dependence, unspecified, uncomplicated: Secondary | ICD-10-CM | POA: Diagnosis not present

## 2015-11-27 DIAGNOSIS — F322 Major depressive disorder, single episode, severe without psychotic features: Secondary | ICD-10-CM | POA: Diagnosis not present

## 2015-11-27 DIAGNOSIS — Z0000 Encounter for general adult medical examination without abnormal findings: Secondary | ICD-10-CM | POA: Diagnosis not present

## 2015-11-27 DIAGNOSIS — Z794 Long term (current) use of insulin: Secondary | ICD-10-CM | POA: Diagnosis not present

## 2015-11-27 DIAGNOSIS — E109 Type 1 diabetes mellitus without complications: Secondary | ICD-10-CM | POA: Diagnosis not present

## 2015-11-27 NOTE — Patient Outreach (Signed)
[facility]) Care Management   11/27/2015  MEOSHIA BILLING 1981-07-18 [id number]  NYAJAH Fitzgerald is an 35 y.o. female who presents to the [facility] office for routine Link To Wellness follow up for self management assistance with Type I DM.   Subjective:  Cayleigh says she has established care with an endocrinologist in [facility]- Dr. Hermelinda Dellen. She also established care with Dr. Lamount Cohen -Lynford Humphrey , dermatologist, for care of her hidradenitis suppurativa. She says since seeing her, her condition has significantly improved. She says she Saw Dr. Hermelinda Dellen on 12/2/316 and her Hgb A1C was 6.8%. She will see him again on 4/26. She says she is still interested in going back on the insulin pump   but has not had time to call the pump rep. She also saw her primary care provider today for her annual wellness exam.  Objective:   Review of Systems  Constitutional: Negative.     Physical Exam  Constitutional: She is oriented to person, place, and time. She appears well-developed and well-nourished.  Respiratory: Effort normal.  Neurological: She is alert and oriented to person, place, and time.  Skin: Skin is warm and dry.  Psychiatric: She has a normal mood and affect. Her behavior is normal. Judgment and thought content normal.   Filed Weights   11/27/15 1650  Weight: 217 lb 6.4 oz (98.612 kg)    Current Medications:   Current Outpatient Prescriptions  Medication Sig Dispense Refill  . Adalimumab (HUMIRA) 40 MG/0.8ML PSKT Inject 40 mg into the skin.    . dapsone 100 MG tablet Take 100 mg by mouth daily.    . insulin aspart (NOVOLOG) 100 UNIT/ML injection Inject 12 Units into the skin 3 (three) times daily before meals.     . insulin glargine (LANTUS) 100 UNIT/ML injection Inject 50 Units into the skin every morning.     Marland Kitchen levonorgestrel (MIRENA) 20 MCG/24HR IUD 1 each by Intrauterine route once.    Marland Kitchen spironolactone (ALDACTONE) 50 MG  tablet Take 50 mg by mouth 2 (two) times daily.     Marland Kitchen amoxicillin-clavulanate (AUGMENTIN) 875-125 MG tablet Take 1 tablet by mouth 2 (two) times daily. (Patient not taking: Reported on 11/27/2015) 14 tablet 0  . doxycycline (VIBRA-TABS) 100 MG tablet Take 1 tablet (100 mg total) by mouth 2 (two) times daily. (Patient not taking: Reported on 11/27/2015) 14 tablet 5  . fluconazole (DIFLUCAN) 150 MG tablet Take 1 tablet (150 mg total) by mouth daily. (Patient not taking: Reported on 11/27/2015) 7 tablet 5  . ondansetron (ZOFRAN) 4 MG tablet Take 1 tablet (4 mg total) by mouth every 6 (six) hours. (Patient not taking: Reported on 10/12/2015) 12 tablet 0   No current facility-administered medications for this visit.    Functional Status:   In your present state of health, do you have any difficulty performing the following activities: 11/27/2015  Hearing? Barbara  Vision? Barbara  Difficulty concentrating or making decisions? Barbara  Walking or climbing stairs? Barbara  Dressing or bathing? Barbara  Doing errands, shopping? Barbara    Fall/Depression Screening:    PHQ 2/9 Scores 03/14/2015  PHQ - 2 Score 1    Assessment:   [employer] employee and Link To Wellness member with Type I DM, most recent Hgb A1C= 6.8% (on 09/14/15) so meeting treatment target. Voicing interest in starting insulin pump and continuous glucose monitor  Plan:  [facility] CM Care Plan Problem One  Most Recent Value   Care Plan Problem One  Type I not meeting Hgb A1C target of <7.0%, Kyelle is interested in trying insulin pump again and a continuous glucose monitor   Role Documenting the Problem One  Care Management [employer] for Problem One  Active   THN Long Term Goal (31-90 days)  Ongoing  good glycemic control as evidenced by Hgb A1C<7.0% and Jovee will contact [employer] rep about initiating insulin pump and continuous glucose monitor therapy    THN Long Term Goal Start Date  11/27/15   THN Long Term Goal Met Date     Interventions for  Problem One Long Term Goal  reviewed the Link To Wellness benefits related to insulin  pump and continuous glucose monitor,provided Luciann with [employer] rep, Roxanne Ripple's,  contact information and encouraged Breasia to contact her, reviewed [employer] Program,  will arrange for Link To Wellness follow up once Raynie has contacted the [employer] rep      RNCM to fax today's office visit note to Dr. Dema Severin. RNCM will meet quarterly and as needed with patient per Link To Wellness program guidelines to assist with Type I DM self-management and assess patient's progress toward mutually set goals.  Barrington Ellison RN,CCM,CDE [facility] Management Coordinator Link To Wellness Office Phone [phone] Office Fax [phone]

## 2015-11-28 ENCOUNTER — Other Ambulatory Visit: Payer: Self-pay | Admitting: Internal Medicine

## 2015-11-28 DIAGNOSIS — E109 Type 1 diabetes mellitus without complications: Secondary | ICD-10-CM

## 2015-11-29 ENCOUNTER — Encounter: Payer: Self-pay | Admitting: *Deleted

## 2015-12-10 DIAGNOSIS — Z5181 Encounter for therapeutic drug level monitoring: Secondary | ICD-10-CM | POA: Diagnosis not present

## 2015-12-10 DIAGNOSIS — Z79899 Other long term (current) drug therapy: Secondary | ICD-10-CM | POA: Diagnosis not present

## 2015-12-10 DIAGNOSIS — L732 Hidradenitis suppurativa: Secondary | ICD-10-CM | POA: Diagnosis not present

## 2015-12-19 ENCOUNTER — Other Ambulatory Visit (INDEPENDENT_AMBULATORY_CARE_PROVIDER_SITE_OTHER): Payer: 59

## 2015-12-19 DIAGNOSIS — E109 Type 1 diabetes mellitus without complications: Secondary | ICD-10-CM | POA: Diagnosis not present

## 2015-12-19 LAB — CBC WITH DIFFERENTIAL/PLATELET
BASOS ABS: 0.1 10*3/uL (ref 0.0–0.1)
Basophils Relative: 0.8 % (ref 0.0–3.0)
Eosinophils Absolute: 0.3 10*3/uL (ref 0.0–0.7)
Eosinophils Relative: 3.7 % (ref 0.0–5.0)
HEMATOCRIT: 41.1 % (ref 36.0–46.0)
HEMOGLOBIN: 13.8 g/dL (ref 12.0–15.0)
LYMPHS PCT: 42.8 % (ref 12.0–46.0)
Lymphs Abs: 3 10*3/uL (ref 0.7–4.0)
MCHC: 33.5 g/dL (ref 30.0–36.0)
MCV: 95.9 fl (ref 78.0–100.0)
MONOS PCT: 5.8 % (ref 3.0–12.0)
Monocytes Absolute: 0.4 10*3/uL (ref 0.1–1.0)
NEUTROS PCT: 46.9 % (ref 43.0–77.0)
Neutro Abs: 3.2 10*3/uL (ref 1.4–7.7)
PLATELETS: 303 10*3/uL (ref 150.0–400.0)
RBC: 4.28 Mil/uL (ref 3.87–5.11)
RDW: 12.4 % (ref 11.5–15.5)
WBC: 6.9 10*3/uL (ref 4.0–10.5)

## 2015-12-19 LAB — LIPID PANEL
CHOLESTEROL: 162 mg/dL (ref 0–200)
HDL: 60.9 mg/dL (ref >39.00)
LDL Cholesterol: 88 mg/dL (ref 0–99)
NONHDL: 100.68
Total CHOL/HDL Ratio: 3
Triglycerides: 61 mg/dL (ref 0.0–149.0)
VLDL: 12.2 mg/dL (ref 0.0–40.0)

## 2015-12-19 LAB — COMPREHENSIVE METABOLIC PANEL
ALK PHOS: 70 U/L (ref 39–117)
ALT: 48 U/L — AB (ref 0–35)
AST: 30 U/L (ref 0–37)
Albumin: 4.2 g/dL (ref 3.5–5.2)
BILIRUBIN TOTAL: 0.4 mg/dL (ref 0.2–1.2)
BUN: 10 mg/dL (ref 6–23)
CALCIUM: 9.5 mg/dL (ref 8.4–10.5)
CO2: 26 mEq/L (ref 19–32)
Chloride: 103 mEq/L (ref 96–112)
Creatinine, Ser: 0.76 mg/dL (ref 0.40–1.20)
GFR: 92.44 mL/min (ref >60.00)
Glucose, Bld: 337 mg/dL — ABNORMAL HIGH (ref 70–99)
Potassium: 4 mEq/L (ref 3.5–5.1)
Sodium: 136 mEq/L (ref 135–145)
TOTAL PROTEIN: 6.8 g/dL (ref 6.0–8.3)

## 2015-12-19 LAB — TSH: TSH: 2.13 u[IU]/mL (ref 0.35–4.50)

## 2015-12-24 ENCOUNTER — Emergency Department (HOSPITAL_COMMUNITY)
Admission: EM | Admit: 2015-12-24 | Discharge: 2015-12-24 | Disposition: A | Discharge status: Home / Self Care | Payer: 59 | Attending: Emergency Medicine | Admitting: Emergency Medicine

## 2015-12-24 ENCOUNTER — Encounter (HOSPITAL_COMMUNITY): Payer: Self-pay | Admitting: Emergency Medicine

## 2015-12-24 ENCOUNTER — Emergency Department (HOSPITAL_COMMUNITY): Payer: 59

## 2015-12-24 DIAGNOSIS — R739 Hyperglycemia, unspecified: Secondary | ICD-10-CM

## 2015-12-24 DIAGNOSIS — Z8701 Personal history of pneumonia (recurrent): Secondary | ICD-10-CM | POA: Diagnosis not present

## 2015-12-24 DIAGNOSIS — R011 Cardiac murmur, unspecified: Secondary | ICD-10-CM | POA: Diagnosis not present

## 2015-12-24 DIAGNOSIS — R0602 Shortness of breath: Secondary | ICD-10-CM | POA: Diagnosis not present

## 2015-12-24 DIAGNOSIS — Z8679 Personal history of other diseases of the circulatory system: Secondary | ICD-10-CM | POA: Insufficient documentation

## 2015-12-24 DIAGNOSIS — Z3202 Encounter for pregnancy test, result negative: Secondary | ICD-10-CM | POA: Diagnosis not present

## 2015-12-24 DIAGNOSIS — Z859 Personal history of malignant neoplasm, unspecified: Secondary | ICD-10-CM | POA: Insufficient documentation

## 2015-12-24 DIAGNOSIS — Z9889 Other specified postprocedural states: Secondary | ICD-10-CM | POA: Insufficient documentation

## 2015-12-24 DIAGNOSIS — J45909 Unspecified asthma, uncomplicated: Secondary | ICD-10-CM | POA: Insufficient documentation

## 2015-12-24 DIAGNOSIS — Z8719 Personal history of other diseases of the digestive system: Secondary | ICD-10-CM | POA: Insufficient documentation

## 2015-12-24 DIAGNOSIS — Z872 Personal history of diseases of the skin and subcutaneous tissue: Secondary | ICD-10-CM | POA: Insufficient documentation

## 2015-12-24 DIAGNOSIS — Z794 Long term (current) use of insulin: Secondary | ICD-10-CM | POA: Diagnosis not present

## 2015-12-24 DIAGNOSIS — F17210 Nicotine dependence, cigarettes, uncomplicated: Secondary | ICD-10-CM | POA: Diagnosis not present

## 2015-12-24 DIAGNOSIS — E1065 Type 1 diabetes mellitus with hyperglycemia: Secondary | ICD-10-CM | POA: Diagnosis not present

## 2015-12-24 DIAGNOSIS — E1165 Type 2 diabetes mellitus with hyperglycemia: Secondary | ICD-10-CM | POA: Insufficient documentation

## 2015-12-24 DIAGNOSIS — Z91040 Latex allergy status: Secondary | ICD-10-CM | POA: Diagnosis not present

## 2015-12-24 DIAGNOSIS — Z8659 Personal history of other mental and behavioral disorders: Secondary | ICD-10-CM | POA: Diagnosis not present

## 2015-12-24 LAB — CBC
HEMATOCRIT: 41.2 % (ref 36.0–46.0)
HEMOGLOBIN: 14.4 g/dL (ref 12.0–15.0)
MCH: 32.4 pg (ref 26.0–34.0)
MCHC: 35 g/dL (ref 30.0–36.0)
MCV: 92.8 fL (ref 78.0–100.0)
PLATELETS: 267 10*3/uL (ref 150–400)
RBC: 4.44 MIL/uL (ref 3.87–5.11)
RDW: 11.8 % (ref 11.5–15.5)
WBC: 9.5 10*3/uL (ref 4.0–10.5)

## 2015-12-24 LAB — URINE MICROSCOPIC-ADD ON

## 2015-12-24 LAB — CBG MONITORING, ED: Glucose-Capillary: 240 mg/dL — ABNORMAL HIGH (ref 65–99)

## 2015-12-24 LAB — URINALYSIS, ROUTINE W REFLEX MICROSCOPIC
Bilirubin Urine: NEGATIVE
Glucose, UA: >1000 mg/dL — AB
Hgb urine dipstick: NEGATIVE
KETONES UR: NEGATIVE mg/dL
LEUKOCYTES UA: NEGATIVE
NITRITE: NEGATIVE
PROTEIN: NEGATIVE mg/dL
Specific Gravity, Urine: 1.045 — ABNORMAL HIGH (ref 1.005–1.030)
pH: 5 (ref 5.0–8.0)

## 2015-12-24 LAB — I-STAT BETA HCG BLOOD, ED (MC, WL, AP ONLY): I-stat hCG, quantitative: <5 m[IU]/mL (ref <5)

## 2015-12-24 LAB — BASIC METABOLIC PANEL
ANION GAP: 8 (ref 5–15)
BUN: 15 mg/dL (ref 6–20)
CALCIUM: 9.3 mg/dL (ref 8.9–10.3)
CO2: 20 mmol/L — AB (ref 22–32)
CREATININE: 0.66 mg/dL (ref 0.44–1.00)
Chloride: 106 mmol/L (ref 101–111)
Glucose, Bld: 212 mg/dL — ABNORMAL HIGH (ref 65–99)
Potassium: 3.6 mmol/L (ref 3.5–5.1)
SODIUM: 134 mmol/L — AB (ref 135–145)

## 2015-12-24 LAB — I-STAT TROPONIN, ED: TROPONIN I, POC: 0 ng/mL (ref 0.00–0.08)

## 2015-12-24 MED ORDER — ONDANSETRON HCL 4 MG/2ML IJ SOLN: 4.0000 mg | Freq: Once | INTRAMUSCULAR | Status: DC

## 2015-12-24 MED ORDER — SODIUM CHLORIDE 0.9 % IV BOLUS (SEPSIS): 2000.0000 mL | Freq: Once | INTRAVENOUS | Status: DC

## 2015-12-24 MED ORDER — ONDANSETRON HCL 4 MG/2ML IJ SOLN
4.0000 mg | Freq: Once | INTRAMUSCULAR | Status: AC | PRN
  Administered 2015-12-24: 4 mg via INTRAVENOUS
  Filled 2015-12-24: qty 2

## 2015-12-24 NOTE — ED Notes (Signed)
Nurse starting IV, drawing labs 

## 2015-12-24 NOTE — ED Notes (Signed)
Pt states when she woke up her blood sugar was high in the 400s, she's been nauseous, has vomited, is having left sided chest pain and SOB radiating into back and left arm. Also c/o weakness and dizzness in triage. States CP worse with inspiration.

## 2015-12-24 NOTE — ED Notes (Addendum)
Pt states she wants to leave prior to finishing treatment. Pt reports nausea is better, but wants to leave prior to IV fluids and PO challenge. Pt informed she is welcome to return if needed.

## 2015-12-24 NOTE — ED Notes (Signed)
Pt says she has not had home zofran today. Previous I thought she said she had taken her home zofran prior to arrival.

## 2015-12-24 NOTE — Discharge Instructions (Signed)
Hyperglycemia °Hyperglycemia occurs when the glucose (sugar) in your blood is too high. Hyperglycemia can happen for many reasons, but it most often happens to people who do not know they have diabetes or are not managing their diabetes properly.  °CAUSES  °Whether you have diabetes or not, there are other causes of hyperglycemia. Hyperglycemia can occur when you have diabetes, but it can also occur in other situations that you might not be as aware of, such as: °Diabetes °· If you have diabetes and are having problems controlling your blood glucose, hyperglycemia could occur because of some of the following reasons: °¨ Not following your meal plan. °¨ Not taking your diabetes medications or not taking it properly. °¨ Exercising less or doing less activity than you normally do. °¨ Being sick. °Pre-diabetes °· This cannot be ignored. Before people develop Type 2 diabetes, they almost always have "pre-diabetes." This is when your blood glucose levels are higher than normal, but not yet high enough to be diagnosed as diabetes. Research has shown that some long-term damage to the body, especially the heart and circulatory system, may already be occurring during pre-diabetes. If you take action to manage your blood glucose when you have pre-diabetes, you may delay or prevent Type 2 diabetes from developing. °Stress °· If you have diabetes, you may be "diet" controlled or on oral medications or insulin to control your diabetes. However, you may find that your blood glucose is higher than usual in the hospital whether you have diabetes or not. This is often referred to as "stress hyperglycemia." Stress can elevate your blood glucose. This happens because of hormones put out by the body during times of stress. If stress has been the cause of your high blood glucose, it can be followed regularly by your caregiver. That way he/she can make sure your hyperglycemia does not continue to get worse or progress to  diabetes. °Steroids °· Steroids are medications that act on the infection fighting system (immune system) to block inflammation or infection. One side effect can be a rise in blood glucose. Most people can produce enough extra insulin to allow for this rise, but for those who cannot, steroids make blood glucose levels go even higher. It is not unusual for steroid treatments to "uncover" diabetes that is developing. It is not always possible to determine if the hyperglycemia will go away after the steroids are stopped. A special blood test called an A1c is sometimes done to determine if your blood glucose was elevated before the steroids were started. °SYMPTOMS °· Thirsty. °· Frequent urination. °· Dry mouth. °· Blurred vision. °· Tired or fatigue. °· Weakness. °· Sleepy. °· Tingling in feet or leg. °DIAGNOSIS  °Diagnosis is made by monitoring blood glucose in one or all of the following ways: °· A1c test. This is a chemical found in your blood. °· Fingerstick blood glucose monitoring. °· Laboratory results. °TREATMENT  °First, knowing the cause of the hyperglycemia is important before the hyperglycemia can be treated. Treatment may include, but is not be limited to: °· Education. °· Change or adjustment in medications. °· Change or adjustment in meal plan. °· Treatment for an illness, infection, etc. °· More frequent blood glucose monitoring. °· Change in exercise plan. °· Decreasing or stopping steroids. °· Lifestyle changes. °HOME CARE INSTRUCTIONS  °· Test your blood glucose as directed. °· Exercise regularly. Your caregiver will give you instructions about exercise. Pre-diabetes or diabetes which comes on with stress is helped by exercising. °· Eat wholesome,   balanced meals. Eat often and at regular, fixed times. Your caregiver or nutritionist will give you a meal plan to guide your sugar intake. °· Being at an ideal weight is important. If needed, losing as little as 10 to 15 pounds may help improve blood  glucose levels. °SEEK MEDICAL CARE IF:  °· You have questions about medicine, activity, or diet. °· You continue to have symptoms (problems such as increased thirst, urination, or weight gain). °SEEK IMMEDIATE MEDICAL CARE IF:  °· You are vomiting or have diarrhea. °· Your breath smells fruity. °· You are breathing faster or slower. °· You are very sleepy or incoherent. °· You have numbness, tingling, or pain in your feet or hands. °· You have chest pain. °· Your symptoms get worse even though you have been following your caregiver's orders. °· If you have any other questions or concerns. °  °This information is not intended to replace advice given to you by your health care provider. Make sure you discuss any questions you have with your health care provider. °  °Document Released: 03/04/2001 Document Revised: 12/01/2011 Document Reviewed: 05/15/2015 °Elsevier Interactive Patient Education ©2016 [employer] ° °

## 2015-12-24 NOTE — ED Provider Notes (Signed)
CSN: [id number]     Arrival date & time 12/24/15  1118 History   First MD Initiated Contact with Patient 12/24/15 1226     Chief Complaint  Patient presents with  . Chest Pain  . Hyperglycemia  . Dizziness     (Consider location/radiation/quality/duration/timing/severity/associated sxs/prior Treatment) HPI   34yF with hyperglycemia. Glucose in 400s this morning. Normally much better. Reports compliance with her medications. Has felt nauseated and vomited this morning. Feels tired/weak. Denies polyuria/polydipsia. No fever or chills. Crampy abdominal pain. Occasional L sided CP. Worse with movement and coughing. Denies sob. Cough nonproductive. No unusual leg pain or swelling.   Past Medical History  Diagnosis Date  . Hx MRSA infection   . Migraine   . Vitamin D insufficiency   . Asthma     as a child  . GERD (gastroesophageal reflux disease)     no current meds.  . Hidradenitis 04/2012    bilat. thighs, left groin - open areas on thighs  . Diabetes mellitus     IDDM, Insulin pump; followed by Dr. [name]  . Abscess of left axilla - PREVOTELLA BIVIA & Staph Coag Neg 07/12/2012    MODERATE PREVOTELLA BIVIA Note: BETA LACTAMASE NEGATIVE    . PCOS (polycystic ovarian syndrome)   . Heart murmur   . Pneumonia     hx  . Depression     hx  . Cancer ([place])     skin tag rt breast  . Hydradenitis   . Cellulitis 06/01/2014    RT INNER THIGH   Past Surgical History  Procedure Laterality Date  . Wisdom tooth extraction    . Tonsillectomy    . Bunionectomy      left  . Esophagogastroduodenoscopy  11/17/2011    Procedure: ESOPHAGOGASTRODUODENOSCOPY (EGD);  Surgeon: [name], MD;  Location: [facility];  Service: Endoscopy;  Laterality: N/A;  . Eye surgery      exc. stye left eye  . Breast surgery      right lumpectomy  . Cholecystectomy  12/02/2006    lap. chole.  . Dilation and evacuation  02/14/2007  . Hydradenitis excision  04/30/2012    Procedure: EXCISION  HYDRADENITIS GROIN;  Surgeon: [name], MD;  Location: [place];  Service: General;  Laterality: Left;  wide excision hidradenitis bilateral thighs and Left groin  . Hydradenitis excision Left 01/13/2014    Procedure: WIDE EXCISION HIDRADENITIS LEFT AXILLA;  Surgeon: [name], MD;  Location: [place];  Service: General;  Laterality: Left;  . Irrigation and debridement abscess Right 06/02/2014    Procedure: IRRIGATION AND DEBRIDEMENT ABSCESS;  Surgeon: [name], MD;  Location: [place];  Service: General;  Laterality: Right;  . Left heart catheterization with coronary angiogram N/A 07/14/2014    Procedure: LEFT HEART CATHETERIZATION WITH CORONARY ANGIOGRAM;  Surgeon: [name], MD;  Location: [facility] CATH LAB;  Service: Cardiovascular;  Laterality: N/A;  . Cardiac catheterization    . Irrigation and debridement abscess Left 09/23/2014    Procedure: IRRIGATION AND DEBRIDEMENT ABSCESS/LEFT THIGH;  Surgeon: [name], MD;  Location: [facility] OR;  Service: General;  Laterality: Left;   Family History  Problem Relation Age of Onset  . Hyperlipidemia Sister   . Hypertension Sister   . Hypertension Father    Social History  Substance Use Topics  . Smoking status: Current Every Day Smoker -- 0.50 packs/day for 14 years    Types: Cigarettes  . Smokeless tobacco: Never  Used  . Alcohol Use: No   OB History    Gravida Para Term Preterm AB TAB SAB Ectopic Multiple Living   4    3  3   1      Review of Systems  All systems reviewed and negative, other than as noted in HPI.   Allergies  Latex; Levofloxacin; Moxifloxacin; Oxycodone-acetaminophen; Peach; Potassium-containing compounds; Prednisone; Propoxyphene n-acetaminophen; Rosiglitazone maleate; Xolair; Adhesive; Morphine and related; Prozac; Cefaclor; Keflex; Promethazine hcl; and Sulfadiazine  Home Medications   Prior to Admission medications   Medication Sig Start Date End Date Taking? Authorizing  Provider  Adalimumab (HUMIRA) 40 MG/0.8ML PSKT Inject 40 mg into the skin every Monday.    Yes Historical Provider, MD  doxycycline (VIBRA-TABS) 100 MG tablet Take 1 tablet (100 mg total) by mouth 2 (two) times daily. Patient taking differently: Take 100 mg by mouth 2 (two) times daily as needed (for skin flare ups).  11/16/15  Yes Deneise Lever, MD  fluconazole (DIFLUCAN) 150 MG tablet Take 1 tablet (150 mg total) by mouth daily. Patient taking differently: Take 150 mg by mouth daily as needed (for yeast infections). Takes when she takes doxycycline 11/16/15  Yes Deneise Lever, MD  insulin aspart (NOVOLOG) 100 UNIT/ML injection Inject 10-12 Units into the skin 3 (three) times daily as needed for high blood sugar. Sliding scale   Yes Historical Provider, MD  insulin glargine (LANTUS) 100 UNIT/ML injection Inject 50 Units into the skin every morning.  05/10/13  Yes Charlynne Cousins, MD  levonorgestrel (MIRENA) 20 MCG/24HR IUD 1 each by Intrauterine route once. Placed 04/2013   Yes Historical Provider, MD  amoxicillin-clavulanate (AUGMENTIN) 875-125 MG tablet Take 1 tablet by mouth 2 (two) times daily. Patient not taking: Reported on 11/27/2015 10/30/15   Deneise Lever, MD   BP 108/72 mmHg  Pulse 70  Temp(Src) 98.1 F (36.7 C) (Oral)  Resp 14  SpO2 100% Physical Exam  Constitutional: She appears well-developed and well-nourished. No distress.  HENT:  Head: Normocephalic and atraumatic.  Eyes: Conjunctivae are normal. Right eye exhibits no discharge. Left eye exhibits no discharge.  Neck: Neck supple.  Cardiovascular: Normal rate, regular rhythm and normal heart sounds.  Exam reveals no gallop and no friction rub.   No murmur heard. Pulmonary/Chest: Effort normal and breath sounds normal. No respiratory distress.  Abdominal: Soft. She exhibits no distension. There is no tenderness.  Musculoskeletal: She exhibits no edema or tenderness.  Neurological: She is alert.  Skin: Skin is warm and  dry.  Psychiatric: She has a normal mood and affect. Her behavior is normal. Thought content normal.  Nursing note and vitals reviewed.   ED Course  Procedures (including critical care time) Labs Review Labs Reviewed  BASIC METABOLIC PANEL - Abnormal; Notable for the following:    Sodium 134 (*)    CO2 20 (*)    Glucose, Bld 212 (*)    All other components within normal limits  URINALYSIS, ROUTINE W REFLEX MICROSCOPIC (NOT AT [facility]) - Abnormal; Notable for the following:    Specific Gravity, Urine 1.045 (*)    Glucose, UA >1000 (*)    All other components within normal limits  URINE MICROSCOPIC-ADD ON - Abnormal; Notable for the following:    Squamous Epithelial / LPF 6-30 (*)    Bacteria, UA RARE (*)    All other components within normal limits  CBG MONITORING, ED - Abnormal; Notable for the following:    Glucose-Capillary 240 (*)  All other components within normal limits  CBC  I-STAT TROPOININ, ED  CBG MONITORING, ED  I-STAT BETA HCG BLOOD, ED ([facility], [facility], [facility] ONLY)    Imaging Review Dg Chest 2 View  12/24/2015  CLINICAL DATA:  Chest pain.  Shortness of breath . EXAM: CHEST  2 VIEW COMPARISON:  10/12/2015. FINDINGS: Mediastinum and hilar structures normal. Low lung volumes with mild basilar atelectasis. Mild interstitial prominence. Mild component of pneumonitis cannot be excluded . Heart size normal. Pulmonary vascularity normal. No pleural effusion or pneumothorax P IMPRESSION: Low lung volumes with mild basilar atelectasis. Mild bilateral interstitial prominence. A mild component of pneumonitis cannot be excluded . Electronically Signed   By: Marcello Moores  Register   On: 12/24/2015 12:34   I have personally reviewed and evaluated these images and lab results as part of my medical decision-making.   EKG Interpretation   Date/Time:  Monday December 24 2015 11:39:23 EDT Ventricular Rate:  80 PR Interval:  131 QRS Duration: 79 QT Interval:  342 QTC Calculation: 394 R Axis:    56 Text Interpretation:  Sinus rhythm Borderline T wave abnormalities No  significant change since last tracing Confirmed by [place]  ([id number]) on 12/24/2015 12:27:14 PM      MDM   Final diagnoses:  Hyperglycemia    -34yF with hyperglycemia. -Reasonable in ED and not in DKA.  -Reports compliance with meds. Glucose may be higher with current illness which I suspect is viral. -Abdominal exam benign. -CP atypical for ACS. Doubt PE, dissection or other emergent process. -Symptoms improved, but not resolved. -Understandably upset about extended wait and does not want to wait further for IVF/meds.  -Advised to keep log of glucose. Return precautions discussed. Outpt FU otherwise.    Virgel Manifold, MD 12/27/15 [phone]

## 2016-01-08 ENCOUNTER — Telehealth: Payer: Self-pay | Admitting: Internal Medicine

## 2016-01-08 MED ORDER — PANTOPRAZOLE SODIUM 40 MG PO TBEC: 40.0000 mg | DELAYED_RELEASE_TABLET | Freq: Every day | ORAL | Status: DC

## 2016-01-08 NOTE — Telephone Encounter (Signed)
Requests refill protonix 40 mg, 90 days. Sent to [facility]

## 2016-01-16 DIAGNOSIS — B373 Candidiasis of vulva and vagina: Secondary | ICD-10-CM | POA: Diagnosis not present

## 2016-01-16 DIAGNOSIS — Z794 Long term (current) use of insulin: Secondary | ICD-10-CM | POA: Diagnosis not present

## 2016-01-16 DIAGNOSIS — Z87410 Personal history of cervical dysplasia: Secondary | ICD-10-CM | POA: Diagnosis not present

## 2016-01-16 DIAGNOSIS — K529 Noninfective gastroenteritis and colitis, unspecified: Secondary | ICD-10-CM | POA: Diagnosis not present

## 2016-01-16 DIAGNOSIS — N898 Other specified noninflammatory disorders of vagina: Secondary | ICD-10-CM | POA: Diagnosis not present

## 2016-01-16 DIAGNOSIS — Z124 Encounter for screening for malignant neoplasm of cervix: Secondary | ICD-10-CM | POA: Diagnosis not present

## 2016-01-16 DIAGNOSIS — E1065 Type 1 diabetes mellitus with hyperglycemia: Secondary | ICD-10-CM | POA: Diagnosis not present

## 2016-01-18 DIAGNOSIS — E109 Type 1 diabetes mellitus without complications: Secondary | ICD-10-CM | POA: Diagnosis not present

## 2016-01-21 ENCOUNTER — Other Ambulatory Visit: Payer: Self-pay | Admitting: *Deleted

## 2016-01-21 NOTE — Patient Outreach (Signed)
Received requested update via e-mail from [facility] on her most recent visit with Dr. Hermelinda Dellen. [name]'s e-mail as follows.   I had my A1C drawn-it was 11.4 ish and I thought I would pass out!!!! I have been sick lately as well-he and I discussed some concerns I have had and things I've been going through.   I ended up going to [facility] ED late March due to GI bug and BS elevated. They told me I was fine and Dr. Hermelinda Dellen told me yesterday that my bicarb level was up and probably should have been kept in hospital. I did sign AMA that day because of poor care-figured if they did not care why should I.Marland KitchenMarland KitchenI did go home and kept fluids, etc down. I am feeling better from all of that.   My thyroid is still an issue-I have ALL the classic symptoms of thyroid disease(labs do not reflect this) and Dr. Hermelinda Dellen states we will address this again in [place] appointment.   I spoke with Roxanne and my pump should be on the way-she was going by Dr. Melida Quitter office to have him sign papers, etc and then have them rush the order to me. Tomorrow's date keeps coming to mind(from memory of our convo) that I should get it then or by first of next week.   A LOT of stress still going on in life at this time as well so I am hoping to get the BS down once I get the pump and get tighter control.   Replied to [facility] e-mail requesting she advise this  RNCM  when she has started pump therapy.

## 2016-01-23 ENCOUNTER — Telehealth: Payer: Self-pay | Admitting: Internal Medicine

## 2016-01-23 MED ORDER — TRAMADOL HCL 50 MG PO TABS: ORAL_TABLET | ORAL | Status: DC

## 2016-01-23 NOTE — Telephone Encounter (Signed)
Persistent jaw pain after dental procedure, not relieved by tylenol, ibuprofen. Plan- try tramadol

## 2016-01-30 ENCOUNTER — Ambulatory Visit (INDEPENDENT_AMBULATORY_CARE_PROVIDER_SITE_OTHER)
Admission: RE | Admit: 2016-01-30 | Discharge: 2016-01-30 | Disposition: A | Discharge status: Home / Self Care | Payer: 59 | Source: Ambulatory Visit | Attending: Internal Medicine | Admitting: Internal Medicine

## 2016-01-30 ENCOUNTER — Other Ambulatory Visit: Payer: Self-pay | Admitting: Internal Medicine

## 2016-01-30 DIAGNOSIS — R059 Cough, unspecified: Secondary | ICD-10-CM

## 2016-01-30 DIAGNOSIS — R05 Cough: Secondary | ICD-10-CM

## 2016-01-30 DIAGNOSIS — R0602 Shortness of breath: Secondary | ICD-10-CM | POA: Diagnosis not present

## 2016-02-08 ENCOUNTER — Telehealth: Payer: Self-pay | Admitting: *Deleted

## 2016-02-08 NOTE — Telephone Encounter (Signed)
I called pateint to attempt to set up insulin pump training for [employer] 630G. She stated she had been trained by a [employer] pump trainer last week and did not meed to make any further plans for training at this time.

## 2016-03-12 DIAGNOSIS — L732 Hidradenitis suppurativa: Secondary | ICD-10-CM | POA: Diagnosis not present

## 2016-03-12 DIAGNOSIS — A499 Bacterial infection, unspecified: Secondary | ICD-10-CM | POA: Diagnosis not present

## 2016-03-12 DIAGNOSIS — E1165 Type 2 diabetes mellitus with hyperglycemia: Secondary | ICD-10-CM | POA: Diagnosis not present

## 2016-03-17 ENCOUNTER — Observation Stay (HOSPITAL_COMMUNITY)
Admission: EM | Admit: 2016-03-17 | Discharge: 2016-03-19 | Disposition: A | Discharge status: Home / Self Care | Payer: 59 | Attending: Internal Medicine | Admitting: Internal Medicine

## 2016-03-17 ENCOUNTER — Encounter (HOSPITAL_COMMUNITY): Payer: Self-pay | Admitting: Emergency Medicine

## 2016-03-17 DIAGNOSIS — L732 Hidradenitis suppurativa: Secondary | ICD-10-CM | POA: Diagnosis not present

## 2016-03-17 DIAGNOSIS — F17210 Nicotine dependence, cigarettes, uncomplicated: Secondary | ICD-10-CM | POA: Insufficient documentation

## 2016-03-17 DIAGNOSIS — Z79899 Other long term (current) drug therapy: Secondary | ICD-10-CM | POA: Diagnosis not present

## 2016-03-17 DIAGNOSIS — R112 Nausea with vomiting, unspecified: Secondary | ICD-10-CM | POA: Insufficient documentation

## 2016-03-17 DIAGNOSIS — R739 Hyperglycemia, unspecified: Secondary | ICD-10-CM | POA: Diagnosis present

## 2016-03-17 DIAGNOSIS — Z85828 Personal history of other malignant neoplasm of skin: Secondary | ICD-10-CM | POA: Insufficient documentation

## 2016-03-17 DIAGNOSIS — E1065 Type 1 diabetes mellitus with hyperglycemia: Secondary | ICD-10-CM | POA: Diagnosis not present

## 2016-03-17 DIAGNOSIS — R197 Diarrhea, unspecified: Secondary | ICD-10-CM | POA: Insufficient documentation

## 2016-03-17 DIAGNOSIS — B373 Candidiasis of vulva and vagina: Secondary | ICD-10-CM | POA: Insufficient documentation

## 2016-03-17 DIAGNOSIS — E109 Type 1 diabetes mellitus without complications: Secondary | ICD-10-CM | POA: Diagnosis not present

## 2016-03-17 DIAGNOSIS — L02215 Cutaneous abscess of perineum: Secondary | ICD-10-CM | POA: Diagnosis not present

## 2016-03-17 DIAGNOSIS — F329 Major depressive disorder, single episode, unspecified: Secondary | ICD-10-CM | POA: Insufficient documentation

## 2016-03-17 DIAGNOSIS — L0291 Cutaneous abscess, unspecified: Secondary | ICD-10-CM | POA: Diagnosis not present

## 2016-03-17 DIAGNOSIS — B9562 Methicillin resistant Staphylococcus aureus infection as the cause of diseases classified elsewhere: Secondary | ICD-10-CM | POA: Insufficient documentation

## 2016-03-17 DIAGNOSIS — L02214 Cutaneous abscess of groin: Secondary | ICD-10-CM | POA: Diagnosis not present

## 2016-03-17 DIAGNOSIS — F17200 Nicotine dependence, unspecified, uncomplicated: Secondary | ICD-10-CM | POA: Diagnosis present

## 2016-03-17 DIAGNOSIS — E1165 Type 2 diabetes mellitus with hyperglycemia: Secondary | ICD-10-CM | POA: Diagnosis not present

## 2016-03-17 LAB — BASIC METABOLIC PANEL
ANION GAP: 8 (ref 5–15)
BUN: 13 mg/dL (ref 6–20)
CALCIUM: 8.1 mg/dL — AB (ref 8.9–10.3)
CO2: 20 mmol/L — ABNORMAL LOW (ref 22–32)
CREATININE: 0.5 mg/dL (ref 0.44–1.00)
Chloride: 109 mmol/L (ref 101–111)
Glucose, Bld: 269 mg/dL — ABNORMAL HIGH (ref 65–99)
Potassium: 3.8 mmol/L (ref 3.5–5.1)
Sodium: 137 mmol/L (ref 135–145)

## 2016-03-17 LAB — CBC
HEMATOCRIT: 42.8 % (ref 36.0–46.0)
HEMOGLOBIN: 14.8 g/dL (ref 12.0–15.0)
MCH: 31.6 pg (ref 26.0–34.0)
MCHC: 34.6 g/dL (ref 30.0–36.0)
MCV: 91.3 fL (ref 78.0–100.0)
Platelets: 291 10*3/uL (ref 150–400)
RBC: 4.69 MIL/uL (ref 3.87–5.11)
RDW: 12.8 % (ref 11.5–15.5)
WBC: 9.6 10*3/uL (ref 4.0–10.5)

## 2016-03-17 LAB — COMPREHENSIVE METABOLIC PANEL
ALBUMIN: 4.2 g/dL (ref 3.5–5.0)
ALT: 20 U/L (ref 14–54)
ANION GAP: 8 (ref 5–15)
AST: 16 U/L (ref 15–41)
Alkaline Phosphatase: 78 U/L (ref 38–126)
BILIRUBIN TOTAL: 0.4 mg/dL (ref 0.3–1.2)
BUN: 17 mg/dL (ref 6–20)
CHLORIDE: 106 mmol/L (ref 101–111)
CO2: 19 mmol/L — AB (ref 22–32)
Calcium: 8.9 mg/dL (ref 8.9–10.3)
Creatinine, Ser: 0.73 mg/dL (ref 0.44–1.00)
GFR calc Af Amer: >60 mL/min (ref >60)
GFR calc non Af Amer: >60 mL/min (ref >60)
GLUCOSE: 449 mg/dL — AB (ref 65–99)
POTASSIUM: 4 mmol/L (ref 3.5–5.1)
SODIUM: 133 mmol/L — AB (ref 135–145)
TOTAL PROTEIN: 7.5 g/dL (ref 6.5–8.1)

## 2016-03-17 LAB — URINE MICROSCOPIC-ADD ON

## 2016-03-17 LAB — BLOOD GAS, VENOUS
Acid-base deficit: 3 mmol/L — ABNORMAL HIGH (ref 0.0–2.0)
Bicarbonate: 21.9 mEq/L (ref 20.0–24.0)
Drawn by: 257701
O2 Saturation: 72.9 %
PATIENT TEMPERATURE: 98.6
PCO2 VEN: 40.9 mmHg — AB (ref 45.0–50.0)
PH VEN: 7.349 — AB (ref 7.250–7.300)
TCO2: 19.5 mmol/L (ref 0–100)
pO2, Ven: 39.8 mmHg (ref 31.0–45.0)

## 2016-03-17 LAB — CBG MONITORING, ED
Glucose-Capillary: 410 mg/dL — ABNORMAL HIGH (ref 65–99)
Glucose-Capillary: 332 mg/dL — ABNORMAL HIGH (ref 65–99)
Glucose-Capillary: 332 mg/dL — ABNORMAL HIGH (ref 65–99)

## 2016-03-17 LAB — URINALYSIS, ROUTINE W REFLEX MICROSCOPIC
BILIRUBIN URINE: NEGATIVE
HGB URINE DIPSTICK: NEGATIVE
KETONES UR: 15 mg/dL — AB
Leukocytes, UA: NEGATIVE
Nitrite: NEGATIVE
PH: 5 (ref 5.0–8.0)
Protein, ur: NEGATIVE mg/dL
SPECIFIC GRAVITY, URINE: 1.041 — AB (ref 1.005–1.030)

## 2016-03-17 LAB — GLUCOSE, CAPILLARY
GLUCOSE-CAPILLARY: 301 mg/dL — AB (ref 65–99)
GLUCOSE-CAPILLARY: 225 mg/dL — AB (ref 65–99)
Glucose-Capillary: 149 mg/dL — ABNORMAL HIGH (ref 65–99)
Glucose-Capillary: 160 mg/dL — ABNORMAL HIGH (ref 65–99)

## 2016-03-17 LAB — I-STAT CG4 LACTIC ACID, ED
LACTIC ACID, VENOUS: 0.59 mmol/L (ref 0.5–2.0)
LACTIC ACID, VENOUS: 0.88 mmol/L (ref 0.5–2.0)

## 2016-03-17 LAB — MRSA PCR SCREENING: MRSA by PCR: NEGATIVE

## 2016-03-17 MED ORDER — ONDANSETRON HCL 4 MG PO TABS: 4.0000 mg | ORAL_TABLET | Freq: Four times a day (QID) | ORAL | Status: DC | PRN

## 2016-03-17 MED ORDER — ENOXAPARIN SODIUM 40 MG/0.4ML ~~LOC~~ SOLN
40.0000 mg | SUBCUTANEOUS | Status: DC
  Administered 2016-03-17: 40 mg via SUBCUTANEOUS
  Filled 2016-03-17: qty 0.4

## 2016-03-17 MED ORDER — SODIUM CHLORIDE 0.9 % IV SOLN
INTRAVENOUS | Status: DC
  Administered 2016-03-17 – 2016-03-18 (×4): via INTRAVENOUS

## 2016-03-17 MED ORDER — HYDROMORPHONE HCL 1 MG/ML IJ SOLN
0.5000 mg | INTRAMUSCULAR | Status: DC | PRN
  Administered 2016-03-18: 0.5 mg via INTRAVENOUS
  Filled 2016-03-17: qty 1

## 2016-03-17 MED ORDER — SODIUM CHLORIDE 0.9 % IV BOLUS (SEPSIS)
500.0000 mL | Freq: Once | INTRAVENOUS | Status: AC
  Administered 2016-03-17: 500 mL via INTRAVENOUS

## 2016-03-17 MED ORDER — ONDANSETRON HCL 4 MG/2ML IJ SOLN: 4.0000 mg | Freq: Four times a day (QID) | INTRAMUSCULAR | Status: DC | PRN

## 2016-03-17 MED ORDER — VANCOMYCIN HCL 10 G IV SOLR
1250.0000 mg | INTRAVENOUS | Status: AC
  Administered 2016-03-17: 1250 mg via INTRAVENOUS
  Filled 2016-03-17: qty 1250

## 2016-03-17 MED ORDER — MUPIROCIN 2 % EX OINT
1.0000 "application " | TOPICAL_OINTMENT | Freq: Two times a day (BID) | CUTANEOUS | Status: DC
  Administered 2016-03-17 – 2016-03-18 (×3): 1 via NASAL
  Filled 2016-03-17: qty 22

## 2016-03-17 MED ORDER — SODIUM CHLORIDE 0.9 % IV BOLUS (SEPSIS)
2000.0000 mL | Freq: Once | INTRAVENOUS | Status: AC
  Administered 2016-03-17: 2000 mL via INTRAVENOUS

## 2016-03-17 MED ORDER — PANTOPRAZOLE SODIUM 40 MG PO TBEC
40.0000 mg | DELAYED_RELEASE_TABLET | Freq: Every day | ORAL | Status: DC
  Administered 2016-03-17 – 2016-03-18 (×2): 40 mg via ORAL
  Filled 2016-03-17 (×2): qty 1

## 2016-03-17 MED ORDER — INSULIN PUMP
Freq: Three times a day (TID) | SUBCUTANEOUS | Status: DC
  Administered 2016-03-17: 1.4 via SUBCUTANEOUS
  Administered 2016-03-17 (×2): via SUBCUTANEOUS
  Administered 2016-03-18: 1.5 via SUBCUTANEOUS
  Administered 2016-03-18 (×3): via SUBCUTANEOUS
  Administered 2016-03-18: 0.3 via SUBCUTANEOUS
  Administered 2016-03-18 – 2016-03-19 (×4): via SUBCUTANEOUS
  Administered 2016-03-19: 0.5 via SUBCUTANEOUS
  Filled 2016-03-17: qty 1

## 2016-03-17 MED ORDER — VANCOMYCIN HCL IN DEXTROSE 1-5 GM/200ML-% IV SOLN
1000.0000 mg | Freq: Once | INTRAVENOUS | Status: AC
  Administered 2016-03-17: 1000 mg via INTRAVENOUS
  Filled 2016-03-17: qty 200

## 2016-03-17 MED ORDER — VANCOMYCIN HCL 10 G IV SOLR
1500.0000 mg | INTRAVENOUS | Status: DC
  Filled 2016-03-17: qty 1500

## 2016-03-17 MED ORDER — LIDOCAINE-EPINEPHRINE (PF) 1 %-1:200000 IJ SOLN
20.0000 mL | Freq: Once | INTRAMUSCULAR | Status: AC
  Administered 2016-03-17: 20 mL
  Filled 2016-03-17: qty 30

## 2016-03-17 MED ORDER — VANCOMYCIN HCL IN DEXTROSE 1-5 GM/200ML-% IV SOLN
1000.0000 mg | Freq: Three times a day (TID) | INTRAVENOUS | Status: DC
  Administered 2016-03-17 – 2016-03-19 (×5): 1000 mg via INTRAVENOUS
  Filled 2016-03-17 (×6): qty 200

## 2016-03-17 MED ORDER — SODIUM CHLORIDE 0.9 % IV BOLUS (SEPSIS)
1000.0000 mL | Freq: Once | INTRAVENOUS | Status: AC
  Administered 2016-03-17: 1000 mL via INTRAVENOUS

## 2016-03-17 MED ORDER — SODIUM CHLORIDE 0.9% FLUSH
3.0000 mL | Freq: Two times a day (BID) | INTRAVENOUS | Status: DC
  Administered 2016-03-17: 3 mL via INTRAVENOUS

## 2016-03-17 MED ORDER — CLINDAMYCIN PHOSPHATE 600 MG/50ML IV SOLN
600.0000 mg | Freq: Once | INTRAVENOUS | Status: DC
  Filled 2016-03-17: qty 50

## 2016-03-17 MED ORDER — ONDANSETRON HCL 4 MG/2ML IJ SOLN
4.0000 mg | Freq: Once | INTRAMUSCULAR | Status: AC | PRN
  Administered 2016-03-17: 4 mg via INTRAVENOUS
  Filled 2016-03-17: qty 2

## 2016-03-17 MED ORDER — CHLORHEXIDINE GLUCONATE CLOTH 2 % EX PADS
6.0000 | MEDICATED_PAD | Freq: Every day | CUTANEOUS | Status: DC
  Administered 2016-03-19: 6 via TOPICAL

## 2016-03-17 MED ORDER — HYDROMORPHONE HCL 1 MG/ML IJ SOLN
0.5000 mg | Freq: Once | INTRAMUSCULAR | Status: AC
  Administered 2016-03-17: 0.5 mg via INTRAVENOUS
  Filled 2016-03-17: qty 1

## 2016-03-17 NOTE — Consult Note (Signed)
Reason for Consult:  Hidradenitis suppurativa with uncontrolled glucose Referring Physician: Elmyra Ricks Pisciotta PA-C  [name] is an 35 y.o. female. Type 1 diabetic on Insulin Pump   HPI: Pt with a long history of Hidradenitis and 3 prior I&D procedures by Dr. Ninfa Linden here in 2013 and 2015 x 2.  She is followed by the dermatology clinic at [facility] in [place].  She has been on Humiera started last May, but on hold for the last week with the advent of the new draining sites.  Last week they performed some I&D's of sites on her right thigh area, near the vagina.  Now her glucose is uncontrolled and cultures are reported to be MRSA positive.  Her dermatologist sent her to the ED here for treatment.  She reports nausea, vomiting and diarrhea over the last 24 hours with this AM's episode of emesis being the worst so far.  She has not been seen in our office since4/1/16 and was placed on Augmentin at that time by Dr. Hassell Done.     Work up in the ED here shows she is afebrile, VSS, PH 7.439  NaHCO3 21.9/. Na 133, glucose 449.  WBC 9.6. Some ketones in the urine, no UTI.  The ED wanted to I&D more of the sites, but she is refusing and will only allow Dr. Ninfa Linden or partner to perform I&D here in [place].    Past Medical History  Diagnosis Date  . Hx MRSA infection   . Migraine   . Vitamin D insufficiency   . Asthma     as a child  . GERD (gastroesophageal reflux disease)     no current meds.  . Hidradenitis 04/2012    bilat. thighs, left groin - open areas on thighs  . Diabetes mellitus     IDDM, Insulin pump; followed by Dr. [name]  . Abscess of left axilla - PREVOTELLA BIVIA & Staph Coag Neg 07/12/2012    MODERATE PREVOTELLA BIVIA Note: BETA LACTAMASE NEGATIVE    . PCOS (polycystic ovarian syndrome)   . Heart murmur   . Pneumonia     hx  . Depression     hx  . Cancer ([place])     skin tag rt breast  . Hydradenitis   . Cellulitis 06/01/2014    RT INNER THIGH     Past Surgical History  Procedure Laterality Date  . Wisdom tooth extraction    . Tonsillectomy    . Bunionectomy      left  . Esophagogastroduodenoscopy  11/17/2011    Procedure: ESOPHAGOGASTRODUODENOSCOPY (EGD);  Surgeon: [name], MD;  Location: [facility];  Service: Endoscopy;  Laterality: N/A;  . Eye surgery      exc. stye left eye  . Breast surgery      right lumpectomy  . Cholecystectomy  12/02/2006    lap. chole.  . Dilation and evacuation  02/14/2007  . Hydradenitis excision  04/30/2012    Procedure: EXCISION HYDRADENITIS GROIN;  Surgeon: [name], MD;  Location: [place];  Service: General;  Laterality: Left;  wide excision hidradenitis bilateral thighs and Left groin  . Hydradenitis excision Left 01/13/2014    Procedure: WIDE EXCISION HIDRADENITIS LEFT AXILLA;  Surgeon: [name], MD;  Location: [place];  Service: General;  Laterality: Left;  . Irrigation and debridement abscess Right 06/02/2014    Procedure: IRRIGATION AND DEBRIDEMENT ABSCESS;  Surgeon: [name], MD;  Location: [place];  Service: General;  Laterality: Right;  . Left heart catheterization with coronary angiogram N/A 07/14/2014    Procedure: LEFT HEART CATHETERIZATION WITH CORONARY ANGIOGRAM;  Surgeon: [name], MD;  Location: [facility] CATH LAB;  Service: Cardiovascular;  Laterality: N/A;  . Cardiac catheterization    . Irrigation and debridement abscess Left 09/23/2014    Procedure: IRRIGATION AND DEBRIDEMENT ABSCESS/LEFT THIGH;  Surgeon: [name], MD;  Location: [facility] OR;  Service: General;  Laterality: Left;    Family History  Problem Relation Age of Onset  . Hyperlipidemia Sister   . Hypertension Sister   . Hypertension Father     Social History:  reports that she has been smoking Cigarettes.  She has a 7 pack-year smoking history. She has never used smokeless tobacco. She reports that she does not drink alcohol or use illicit drugs.  Allergies:   Allergies  Allergen Reactions  . Latex Hives, Shortness Of Breath and Rash  . Levofloxacin Shortness Of Breath and Rash  . Moxifloxacin Shortness Of Breath and Rash  . Oxycodone-Acetaminophen Shortness Of Breath, Swelling and Rash    NORCO/VICODIN OK  . Peach [Prunus Persica] Hives and Shortness Of Breath  . Potassium-Containing Compounds Other (See Comments)    IV ROUTE - CAUSES VEINS TO COLLAPS; Reports that it is undiluted K only  . Prednisone Other (See Comments)    SEVERE ELEVATION OF BLOOD SUGAR. Able to tolerate 40 mg  . Propoxyphene N-Acetaminophen Swelling    SWELLING OF FACE AND THROAT  . Rosiglitazone Maleate Swelling    SWELLING OF FACE AND LEGS  . Xolair [Omalizumab] Other (See Comments)    Rash and anaphylaxis  . Adhesive [Tape] Hives, Itching and Rash  . Morphine And Related Other (See Comments)    Causes hallucinations  . Prozac [Fluoxetine Hcl] Other (See Comments)    Made her very aggressive   . Clindamycin/Lincomycin   . Septra [Sulfamethoxazole-Trimethoprim]   . Cefaclor Rash  . Keflex [Cephalexin] Diarrhea and Rash    REACTION: severe migraine  . Promethazine Hcl Other (See Comments)    IV ROUTE ONLY - JITTERY FEELING. Patient reports that it is mild and she has used promethazine since then  PO tablet ok  . Sulfadiazine Rash    Prior to Admission medications   Medication Sig Start Date End Date Taking? Authorizing Provider  Adalimumab (HUMIRA) 40 MG/0.8ML PSKT Inject 40 mg into the skin every Monday.    Yes Historical Provider, MD  doxycycline (VIBRA-TABS) 100 MG tablet Take 1 tablet (100 mg total) by mouth 2 (two) times daily. Patient taking differently: Take 100 mg by mouth 2 (two) times daily as needed (for skin flare ups).  11/16/15  Yes Deneise Lever, MD  fluconazole (DIFLUCAN) 150 MG tablet Take 1 tablet (150 mg total) by mouth daily. Patient taking differently: Take 150 mg by mouth daily as needed (for yeast infections). Takes when she takes  doxycycline 11/16/15  Yes Deneise Lever, MD  insulin glargine (LANTUS) 100 UNIT/ML injection Inject 50 Units into the skin at bedtime as needed. *Only use when insulin pump goes out* 05/10/13  Yes Charlynne Cousins, MD  Insulin Human (INSULIN PUMP) SOLN Inject into the skin continuous. *Novolog*   Yes Historical Provider, MD  levonorgestrel (MIRENA) 20 MCG/24HR IUD 1 each by Intrauterine route once. Placed 04/2013   Yes Historical Provider, MD  pantoprazole (PROTONIX) 40 MG tablet Take 1 tablet (40 mg total) by mouth daily. 01/08/16  Yes Deneise Lever, MD  amoxicillin-clavulanate (AUGMENTIN)  875-125 MG tablet Take 1 tablet by mouth 2 (two) times daily. Patient not taking: Reported on 11/27/2015 10/30/15   Deneise Lever, MD  traMADol Veatrice Bourbon) 50 MG tablet 1 or 2 tabs every 6 hours as needed for pain Patient not taking: Reported on 03/17/2016 01/23/16   Deneise Lever, MD     Results for orders placed or performed during the hospital encounter of 03/17/16 (from the past 48 hour(s))  Comprehensive metabolic panel     Status: Abnormal   Collection Time: 03/17/16  9:32 AM  Result Value Ref Range   Sodium 133 (L) 135 - 145 mmol/L   Potassium 4.0 3.5 - 5.1 mmol/L   Chloride 106 101 - 111 mmol/L   CO2 19 (L) 22 - 32 mmol/L   Glucose, Bld 449 (H) 65 - 99 mg/dL   BUN 17 6 - 20 mg/dL   Creatinine, Ser 0.73 0.44 - 1.00 mg/dL   Calcium 8.9 8.9 - 10.3 mg/dL   Total Protein 7.5 6.5 - 8.1 g/dL   Albumin 4.2 3.5 - 5.0 g/dL   AST 16 15 - 41 U/L   ALT 20 14 - 54 U/L   Alkaline Phosphatase 78 38 - 126 U/L   Total Bilirubin 0.4 0.3 - 1.2 mg/dL   GFR calc non Af Amer >60 >60 mL/min   GFR calc Af Amer >60 >60 mL/min    Comment: (NOTE) The eGFR has been calculated using the CKD EPI equation. This calculation has not been validated in all clinical situations. eGFR's persistently <60 mL/min signify possible Chronic Kidney Disease.    Anion gap 8 5 - 15  CBC     Status: None   Collection Time: 03/17/16   9:32 AM  Result Value Ref Range   WBC 9.6 4.0 - 10.5 K/uL   RBC 4.69 3.87 - 5.11 MIL/uL   Hemoglobin 14.8 12.0 - 15.0 g/dL   HCT 42.8 36.0 - 46.0 %   MCV 91.3 78.0 - 100.0 fL   MCH 31.6 26.0 - 34.0 pg   MCHC 34.6 30.0 - 36.0 g/dL   RDW 12.8 11.5 - 15.5 %   Platelets 291 150 - 400 K/uL  CBG monitoring, ED     Status: Abnormal   Collection Time: 03/17/16 10:00 AM  Result Value Ref Range   Glucose-Capillary 410 (H) 65 - 99 mg/dL  Urinalysis, Routine w reflex microscopic     Status: Abnormal   Collection Time: 03/17/16 10:01 AM  Result Value Ref Range   Color, Urine YELLOW YELLOW   APPearance CLEAR CLEAR   Specific Gravity, Urine 1.041 (H) 1.005 - 1.030   pH 5.0 5.0 - 8.0   Glucose, UA >1000 (A) NEGATIVE mg/dL   Hgb urine dipstick NEGATIVE NEGATIVE   Bilirubin Urine NEGATIVE NEGATIVE   Ketones, ur 15 (A) NEGATIVE mg/dL   Protein, ur NEGATIVE NEGATIVE mg/dL   Nitrite NEGATIVE NEGATIVE   Leukocytes, UA NEGATIVE NEGATIVE  Urine microscopic-add on     Status: Abnormal   Collection Time: 03/17/16 10:01 AM  Result Value Ref Range   Squamous Epithelial / LPF 0-5 (A) NONE SEEN   WBC, UA 0-5 0 - 5 WBC/hpf   RBC / HPF 0-5 0 - 5 RBC/hpf   Bacteria, UA FEW (A) NONE SEEN  I-Stat CG4 Lactic Acid, ED     Status: None   Collection Time: 03/17/16 10:34 AM  Result Value Ref Range   Lactic Acid, Venous 0.59 0.5 - 2.0 mmol/L  Blood  gas, venous     Status: Abnormal   Collection Time: 03/17/16 11:10 AM  Result Value Ref Range   pH, Ven 7.349 (H) 7.250 - 7.300   pCO2, Ven 40.9 (L) 45.0 - 50.0 mmHg   pO2, Ven 39.8 31.0 - 45.0 mmHg   Bicarbonate 21.9 20.0 - 24.0 mEq/L   TCO2 19.5 0 - 100 mmol/L   Acid-base deficit 3.0 (H) 0.0 - 2.0 mmol/L   O2 Saturation 72.9 %   Patient temperature 98.6    Collection site COLLECTED BY NURSE    Drawn by [phone]    Sample type VEIN   POC CBG, ED     Status: Abnormal   Collection Time: 03/17/16  1:31 PM  Result Value Ref Range   Glucose-Capillary 332 (H) 65  - 99 mg/dL  I-Stat CG4 Lactic Acid, ED     Status: None   Collection Time: 03/17/16  1:57 PM  Result Value Ref Range   Lactic Acid, Venous 0.88 0.5 - 2.0 mmol/L    No results found.  Review of Systems  Constitutional: Positive for fever (last PM) and chills. Negative for weight loss, malaise/fatigue and diaphoresis.  HENT: Negative.   Eyes: Negative.   Cardiovascular: Negative.   Gastrointestinal: Positive for nausea, vomiting and diarrhea (loose stools, not diarrhea). Negative for heartburn, abdominal pain, constipation, blood in stool and melena.  Genitourinary: Negative for dysuria, urgency, frequency, hematuria and flank pain.       Odor from draining sites  Musculoskeletal: Negative.   Skin: Negative.   Neurological: Negative.  Negative for weakness.  Endo/Heme/Allergies: Negative.   Psychiatric/Behavioral: The patient is nervous/anxious.    Blood pressure 101/70, pulse 73, temperature 98.2 F (36.8 C), temperature source Oral, resp. rate 18, height '5\' 5"'  (1.651 m), weight 103.42 kg (228 lb), SpO2 97 %. Physical Exam  Constitutional: She is oriented to person, place, and time. She appears well-developed and well-nourished. No distress.  65 inch  Wt 228 NAD  HENT:  Head: Normocephalic.  Eyes: Right eye exhibits no discharge. Left eye exhibits no discharge. No scleral icterus.  Neck: Normal range of motion. Neck supple. No JVD present. No tracheal deviation present. No thyromegaly present.  She has a 3 mm site right neck, looks more like Acne than hidradenitits  Cardiovascular: Normal rate, regular rhythm, normal heart sounds and intact distal pulses.   Respiratory: Effort normal and breath sounds normal. No respiratory distress. She has no wheezes. She has no rales. She exhibits no tenderness.  GI: Soft. Bowel sounds are normal. She exhibits no distension and no mass. There is no tenderness. There is no rebound and no guarding.  Musculoskeletal: She exhibits no edema or  tenderness.  Lymphadenopathy:    She has no cervical adenopathy.  Neurological: She is alert and oriented to person, place, and time. No cranial nerve deficit.  Skin: Skin is warm and dry. No rash noted. She is not diaphoretic. No erythema. No pallor.  She has what looks like a acne right lower neck about 3 mm site.  Right mid thigh site I&D's last week draining some.  An indurated area lip of vagina on left.  Old sites with prior surgery all look fairly good.  Psychiatric: She has a normal mood and affect. Her behavior is normal. Judgment and thought content normal.    Assessment/Plan: Hidradenitis Supprativa with I&D right thigh last week/MRSA positive Nausea and vomiting for 12 hours Type I diabetes  Hx of Migraines Depression  BMI 37.9 Multiple  drug allergies (16 listed on chart)  Plan:  Reviewed by Dr. Marcello Moores.  We recommend 48 hours of antibiotics, heating pads to sites and we will follow with you.  We would not try to I&D any of these sites currently.   We will follow with you.        Caroll Cunnington 03/17/2016, 3:20 PM

## 2016-03-17 NOTE — Progress Notes (Addendum)
Pharmacy Antibiotic Note  [name] is a 35 y.o. female with PMH of hidradenitis suppurativa followed at [facility], [place], and multiple drug allergies admitted on 03/17/2016. Patient underwent I&D of right groin on 03/12/16 with wound culture MRSA positive. Pharmacy has been consulted for Vancomycin dosing.  Plan: Vancomycin 1g IV x 1 given in the ED. Give additional 1250mg  IV x 1 to complete load. Continue with Vancomycin 1g IV q8h. Plan for Vancomycin trough level at steady state as warranted. Monitor renal function, cultures, clinical course.  Height: 5\' 5"  (165.1 cm) Weight: 228 lb (103.42 kg) IBW/kg (Calculated) : 57  Temp (24hrs), Avg:98.3 F (36.8 C), Min:98.2 F (36.8 C), Max:98.4 F (36.9 C)   Recent Labs Lab 03/17/16 0932 03/17/16 1034 03/17/16 1357  WBC 9.6  --   --   CREATININE 0.73  --   --   LATICACIDVEN  --  0.59 0.88    Estimated Creatinine Clearance: 118.3 mL/min (by C-G formula based on Cr of 0.73).    Allergies  Allergen Reactions  . Latex Hives, Shortness Of Breath and Rash  . Levofloxacin Shortness Of Breath and Rash  . Moxifloxacin Shortness Of Breath and Rash  . Oxycodone-Acetaminophen Shortness Of Breath, Swelling and Rash    NORCO/VICODIN OK  . Peach [Prunus Persica] Hives and Shortness Of Breath  . Potassium-Containing Compounds Other (See Comments)    IV ROUTE - CAUSES VEINS TO COLLAPS; Reports that it is undiluted K only  . Prednisone Other (See Comments)    SEVERE ELEVATION OF BLOOD SUGAR. Able to tolerate 40 mg  . Propoxyphene N-Acetaminophen Swelling    SWELLING OF FACE AND THROAT  . Rosiglitazone Maleate Swelling    SWELLING OF FACE AND LEGS  . Xolair [Omalizumab] Other (See Comments)    Rash and anaphylaxis  . Adhesive [Tape] Hives, Itching and Rash  . Morphine And Related Other (See Comments)    Causes hallucinations  . Prozac [Fluoxetine Hcl] Other (See Comments)    Made her very aggressive   .  Clindamycin/Lincomycin   . Septra [Sulfamethoxazole-Trimethoprim]   . Cefaclor Rash  . Keflex [Cephalexin] Diarrhea and Rash    REACTION: severe migraine  . Promethazine Hcl Other (See Comments)    IV ROUTE ONLY - JITTERY FEELING. Patient reports that it is mild and she has used promethazine since then  PO tablet ok  . Sulfadiazine Rash    Antimicrobials this admission: 6/26 >> Vancomycin >>   Dose adjustments this admission: ---  Microbiology results: 6/21 Wound Cx (@ [facility]): MRSA (S to Vancomycin, Trimethoprim/Sulfamethoxazole, Gentamicin, Clindamycin D-test negative; R to Oxacillin, Erythromycin) 6/26 BCx: sent  Thank you for allowing pharmacy to be a part of this patient's care.   Lindell Spar, PharmD, BCPS Pager: [phone] 03/17/2016 4:21 PM

## 2016-03-17 NOTE — H&P (Addendum)
History and Physical    [name] MCE:[id number] DOB: [birth year] DOA: 03/17/2016  PCP: Vidal Schwalbe, MD  Patient coming from: Home   Chief Complaint: abnormal wound culture, growing MRSA>   HPI: [name] is a 35 y.o. female with medical history significant of hidradenitis suppurative, follow with dermatology at [facility], DM on insulin Pump who presents with elevated blood sugar, difficult to controlled at home. She underwent I and D of hidradenitis right groin by her dermatology on June 21, culture was send. She received an e-mail from her endocrinologist that wound grew MRSA. Patient has multi[ple antibiotics allergies. She also report, drainage, purulent from her wound in her groin the day prior to admission.   She also report nausea, vomiting and loose stool in the last 24 hours.   ED Course: sodium 133, Ph 7.3, bicarb 19, lactic acid 0.8,UA 0 to 5 WBC, CBG 332  Review of Systems: As per HPI otherwise 10 point review of systems negative.    Past Medical History  Diagnosis Date  . Hx MRSA infection   . Migraine   . Vitamin D insufficiency   . Asthma     as a child  . GERD (gastroesophageal reflux disease)     no current meds.  . Hidradenitis 04/2012    bilat. thighs, left groin - open areas on thighs  . Diabetes mellitus     IDDM, Insulin pump; followed by Dr. [name]  . Abscess of left axilla - PREVOTELLA BIVIA & Staph Coag Neg 07/12/2012    MODERATE PREVOTELLA BIVIA Note: BETA LACTAMASE NEGATIVE    . PCOS (polycystic ovarian syndrome)   . Heart murmur   . Pneumonia     hx  . Depression     hx  . Cancer ([place])     skin tag rt breast  . Hydradenitis   . Cellulitis 06/01/2014    RT INNER THIGH    Past Surgical History  Procedure Laterality Date  . Wisdom tooth extraction    . Tonsillectomy    . Bunionectomy      left  . Esophagogastroduodenoscopy  11/17/2011    Procedure: ESOPHAGOGASTRODUODENOSCOPY (EGD);  Surgeon: [name], MD;   Location: [facility];  Service: Endoscopy;  Laterality: N/A;  . Eye surgery      exc. stye left eye  . Breast surgery      right lumpectomy  . Cholecystectomy  12/02/2006    lap. chole.  . Dilation and evacuation  02/14/2007  . Hydradenitis excision  04/30/2012    Procedure: EXCISION HYDRADENITIS GROIN;  Surgeon: [name], MD;  Location: [place];  Service: General;  Laterality: Left;  wide excision hidradenitis bilateral thighs and Left groin  . Hydradenitis excision Left 01/13/2014    Procedure: WIDE EXCISION HIDRADENITIS LEFT AXILLA;  Surgeon: [name], MD;  Location: [place];  Service: General;  Laterality: Left;  . Irrigation and debridement abscess Right 06/02/2014    Procedure: IRRIGATION AND DEBRIDEMENT ABSCESS;  Surgeon: [name], MD;  Location: [place];  Service: General;  Laterality: Right;  . Left heart catheterization with coronary angiogram N/A 07/14/2014    Procedure: LEFT HEART CATHETERIZATION WITH CORONARY ANGIOGRAM;  Surgeon: [name], MD;  Location: [facility] CATH LAB;  Service: Cardiovascular;  Laterality: N/A;  . Cardiac catheterization    . Irrigation and debridement abscess Left 09/23/2014    Procedure: IRRIGATION AND DEBRIDEMENT ABSCESS/LEFT THIGH;  Surgeon: [name], MD;  Location: [place];  Service: General;  Laterality: Left;     reports that she has been smoking Cigarettes.  She has a 7 pack-year smoking history. She has never used smokeless tobacco. She reports that she does not drink alcohol or use illicit drugs.  Allergies  Allergen Reactions  . Latex Hives, Shortness Of Breath and Rash  . Levofloxacin Shortness Of Breath and Rash  . Moxifloxacin Shortness Of Breath and Rash  . Oxycodone-Acetaminophen Shortness Of Breath, Swelling and Rash    NORCO/VICODIN OK  . Peach [Prunus Persica] Hives and Shortness Of Breath  . Potassium-Containing Compounds Other (See Comments)    IV ROUTE - CAUSES VEINS TO COLLAPS; Reports  that it is undiluted K only  . Prednisone Other (See Comments)    SEVERE ELEVATION OF BLOOD SUGAR. Able to tolerate 40 mg  . Propoxyphene N-Acetaminophen Swelling    SWELLING OF FACE AND THROAT  . Rosiglitazone Maleate Swelling    SWELLING OF FACE AND LEGS  . Xolair [Omalizumab] Other (See Comments)    Rash and anaphylaxis  . Adhesive [Tape] Hives, Itching and Rash  . Morphine And Related Other (See Comments)    Causes hallucinations  . Prozac [Fluoxetine Hcl] Other (See Comments)    Made her very aggressive   . Clindamycin/Lincomycin   . Septra [Sulfamethoxazole-Trimethoprim]   . Cefaclor Rash  . Keflex [Cephalexin] Diarrhea and Rash    REACTION: severe migraine  . Promethazine Hcl Other (See Comments)    IV ROUTE ONLY - JITTERY FEELING. Patient reports that it is mild and she has used promethazine since then  PO tablet ok  . Sulfadiazine Rash    Family History  Problem Relation Age of Onset  . Hyperlipidemia Sister   . Hypertension Sister   . Hypertension Father      Prior to Admission medications   Medication Sig Start Date End Date Taking? Authorizing Provider  Adalimumab (HUMIRA) 40 MG/0.8ML PSKT Inject 40 mg into the skin every Monday.    Yes Historical Provider, MD  doxycycline (VIBRA-TABS) 100 MG tablet Take 1 tablet (100 mg total) by mouth 2 (two) times daily. Patient taking differently: Take 100 mg by mouth 2 (two) times daily as needed (for skin flare ups).  11/16/15  Yes Deneise Lever, MD  fluconazole (DIFLUCAN) 150 MG tablet Take 1 tablet (150 mg total) by mouth daily. Patient taking differently: Take 150 mg by mouth daily as needed (for yeast infections). Takes when she takes doxycycline 11/16/15  Yes Deneise Lever, MD  insulin glargine (LANTUS) 100 UNIT/ML injection Inject 50 Units into the skin at bedtime as needed. *Only use when insulin pump goes out* 05/10/13  Yes Charlynne Cousins, MD  Insulin Human (INSULIN PUMP) SOLN Inject into the skin continuous.  *Novolog*   Yes Historical Provider, MD  levonorgestrel (MIRENA) 20 MCG/24HR IUD 1 each by Intrauterine route once. Placed 04/2013   Yes Historical Provider, MD  pantoprazole (PROTONIX) 40 MG tablet Take 1 tablet (40 mg total) by mouth daily. 01/08/16  Yes Deneise Lever, MD  amoxicillin-clavulanate (AUGMENTIN) 875-125 MG tablet Take 1 tablet by mouth 2 (two) times daily. Patient not taking: Reported on 11/27/2015 10/30/15   Deneise Lever, MD  traMADol (ULTRAM) 50 MG tablet 1 or 2 tabs every 6 hours as needed for pain Patient not taking: Reported on 03/17/2016 01/23/16   Deneise Lever, MD    Physical Exam: Filed Vitals:   03/17/16 0910 03/17/16 1332  BP: 115/79 101/70  Pulse: 105  73  Temp: 98.4 F (36.9 C) 98.2 F (36.8 C)  TempSrc: Oral Oral  Resp: 18 18  Height: '5\' 5"'  (1.651 m)   Weight: 103.42 kg (228 lb)   SpO2: 100% 97%      Constitutional: NAD, calm, comfortable Filed Vitals:   03/17/16 0910 03/17/16 1332  BP: 115/79 101/70  Pulse: 105 73  Temp: 98.4 F (36.9 C) 98.2 F (36.8 C)  TempSrc: Oral Oral  Resp: 18 18  Height: '5\' 5"'  (1.651 m)   Weight: 103.42 kg (228 lb)   SpO2: 100% 97%   Eyes: PERRL, lids and conjunctivae normal ENMT: Mucous membranes are moist. Posterior pharynx clear of any exudate or lesions.Normal dentition.  Neck: normal, supple, no masses, no thyromegaly Respiratory: clear to auscultation bilaterally, no wheezing, no crackles. Normal respiratory effort. No accessory muscle use.  Cardiovascular: Regular rate and rhythm, no murmurs / rubs / gallops. No extremity edema. 2+ pedal pulses. No carotid bruits.  Abdomen: no tenderness, no masses palpated. No hepatosplenomegaly. Bowel sounds positive.  Musculoskeletal: no clubbing / cyanosis. No joint deformity upper and lower extremities. Good ROM, no contractures. Normal muscle tone.  Skin: pubic area with multiple small wound,mildredness, no significant drainage notice.  Neurologic: CN 2-12 grossly  intact. Sensation intact, DTR normal. Strength 5/5 in all 4.  Psychiatric: Normal judgment and insight. Alert and oriented x 3. Normal mood.     Labs on Admission: I have personally reviewed following labs and imaging studies  CBC:  Recent Labs Lab 03/17/16 0932  WBC 9.6  HGB 14.8  HCT 42.8  MCV 91.3  PLT 410   Basic Metabolic Panel:  Recent Labs Lab 03/17/16 0932  NA 133*  K 4.0  CL 106  CO2 19*  GLUCOSE 449*  BUN 17  CREATININE 0.73  CALCIUM 8.9   GFR: Estimated Creatinine Clearance: 118.3 mL/min (by C-G formula based on Cr of 0.73). Liver Function Tests:  Recent Labs Lab 03/17/16 0932  AST 16  ALT 20  ALKPHOS 78  BILITOT 0.4  PROT 7.5  ALBUMIN 4.2   No results for input(s): LIPASE, AMYLASE in the last 168 hours. No results for input(s): AMMONIA in the last 168 hours. Coagulation Profile: No results for input(s): INR, PROTIME in the last 168 hours. Cardiac Enzymes: No results for input(s): CKTOTAL, CKMB, CKMBINDEX, TROPONINI in the last 168 hours. BNP (last 3 results) No results for input(s): PROBNP in the last 8760 hours. HbA1C: No results for input(s): HGBA1C in the last 72 hours. CBG:  Recent Labs Lab 03/17/16 1000 03/17/16 1331 03/17/16 1543  GLUCAP 410* 332* 332*   Lipid Profile: No results for input(s): CHOL, HDL, LDLCALC, TRIG, CHOLHDL, LDLDIRECT in the last 72 hours. Thyroid Function Tests: No results for input(s): TSH, T4TOTAL, FREET4, T3FREE, THYROIDAB in the last 72 hours. Anemia Panel: No results for input(s): VITAMINB12, FOLATE, FERRITIN, TIBC, IRON, RETICCTPCT in the last 72 hours. Urine analysis:    Component Value Date/Time   COLORURINE YELLOW 03/17/2016 1001   APPEARANCEUR CLEAR 03/17/2016 1001   LABSPEC 1.041* 03/17/2016 1001   PHURINE 5.0 03/17/2016 1001   GLUCOSEU >1000* 03/17/2016 1001   GLUCOSEU >=1000 07/30/2009 1729   HGBUR NEGATIVE 03/17/2016 1001   BILIRUBINUR NEGATIVE 03/17/2016 1001   KETONESUR 15*  03/17/2016 1001   PROTEINUR NEGATIVE 03/17/2016 1001   UROBILINOGEN 1.0 03/05/2015 1130   NITRITE NEGATIVE 03/17/2016 1001   LEUKOCYTESUR NEGATIVE 03/17/2016 1001   Sepsis Labs: !!!!!!!!!!!!!!!!!!!!!!!!!!!!!!!!!!!!!!!!!!!! '@LABRCNTIP' (procalcitonin:4,lacticidven:4) )No results found for this or any  previous visit (from the past 240 hour(s)).   Radiological Exams on Admission: No results found.  EKG: none available.   Assessment/Plan Active Problems:   Type 1 diabetes mellitus (HCC)   Hidradenitis suppurativa   Hyperglycemia   Abscess  1-Suppurative hidradenitis; Right groin. Recent wound grew MRSA.  Surgery consulted.  IV vancomycin  Surgery consulted.  Will need to discussed with ID oral options in the events of patient multiples allergies.  IV vancomycin   2-DM,uncontrolled.; IV fluids. Continue with insulin Pump. If on repeated B-met gap elevated of unable to controlled CBG with insulin pump, will need to start insulin Gtt.   3-N, V,  loose stool; IV fluids. If persist will need to check for C diff.    DVT prophylaxis: Lovenox.  Code Status: full code.  Family Communication: Mother at bedside.  Disposition Plan: home in 24 to 48 hours.  Consults called: general surgery  Admission status: observation, telemetry    Niel Hummer A MD [facility] Pager [phone]  If 7PM-7AM, please contact night-coverage [email or web address] Password [facility]  03/17/2016, 4:29 PM

## 2016-03-17 NOTE — ED Provider Notes (Signed)
CSN: [id number]     Arrival date & time 03/17/16  [id number] History   First MD Initiated Contact with Patient 03/17/16 [phone]     Chief Complaint  Patient presents with  . Abscess  . MRSA   . Hyperglycemia     (Consider location/radiation/quality/duration/timing/severity/associated sxs/prior Treatment) HPI   Blood pressure 115/79, pulse 105, temperature 98.4 F (36.9 C), temperature source Oral, resp. rate 18, height 5\' 5"  (1.651 m), weight 103.42 kg, SpO2 100 %.  [name] is a 35 y.o. female complaining of increased blood glucose around 400s over the last several days with multiple abscesses to the vaginal area, she has a history of hidradenitis suprapubic [place], she follows at [facility] for this. She had an IND of one on Wednesday, culture came back positive for MRSA, she was called by dermatologist and advised to present to the ED because culture showed MRSA, she is allergic to Bactrim and clindamycin with anaphylactic reactions to both. Patient denies fevers but endorses chills, states the pain from the lesions are severe, some are actively draining. She also reports nausea, vomiting and diarrhea worse over the last day, last episode of emesis was this morning.  Past Medical History  Diagnosis Date  . Hx MRSA infection   . Migraine   . Vitamin D insufficiency   . Asthma     as a child  . GERD (gastroesophageal reflux disease)     no current meds.  . Hidradenitis 04/2012    bilat. thighs, left groin - open areas on thighs  . Diabetes mellitus     IDDM, Insulin pump; followed by Dr. [name]  . Abscess of left axilla - PREVOTELLA BIVIA & Staph Coag Neg 07/12/2012    MODERATE PREVOTELLA BIVIA Note: BETA LACTAMASE NEGATIVE    . PCOS (polycystic ovarian syndrome)   . Heart murmur   . Pneumonia     hx  . Depression     hx  . Cancer ([place])     skin tag rt breast  . Hydradenitis   . Cellulitis 06/01/2014    RT INNER THIGH   Past Surgical History  Procedure Laterality Date  .  Wisdom tooth extraction    . Tonsillectomy    . Bunionectomy      left  . Esophagogastroduodenoscopy  11/17/2011    Procedure: ESOPHAGOGASTRODUODENOSCOPY (EGD);  Surgeon: [name], MD;  Location: [facility];  Service: Endoscopy;  Laterality: N/A;  . Eye surgery      exc. stye left eye  . Breast surgery      right lumpectomy  . Cholecystectomy  12/02/2006    lap. chole.  . Dilation and evacuation  02/14/2007  . Hydradenitis excision  04/30/2012    Procedure: EXCISION HYDRADENITIS GROIN;  Surgeon: [name], MD;  Location: [place];  Service: General;  Laterality: Left;  wide excision hidradenitis bilateral thighs and Left groin  . Hydradenitis excision Left 01/13/2014    Procedure: WIDE EXCISION HIDRADENITIS LEFT AXILLA;  Surgeon: [name], MD;  Location: [place];  Service: General;  Laterality: Left;  . Irrigation and debridement abscess Right 06/02/2014    Procedure: IRRIGATION AND DEBRIDEMENT ABSCESS;  Surgeon: [name], MD;  Location: [place];  Service: General;  Laterality: Right;  . Left heart catheterization with coronary angiogram N/A 07/14/2014    Procedure: LEFT HEART CATHETERIZATION WITH CORONARY ANGIOGRAM;  Surgeon: [name], MD;  Location: [facility] C[facility] LAB;  Service: Cardiovascular;  Laterality: N/A;  .  Cardiac catheterization    . Irrigation and debridement abscess Left 09/23/2014    Procedure: IRRIGATION AND DEBRIDEMENT ABSCESS/LEFT THIGH;  Surgeon: [name], MD;  Location: [facility] OR;  Service: General;  Laterality: Left;   Family History  Problem Relation Age of Onset  . Hyperlipidemia Sister   . Hypertension Sister   . Hypertension Father    Social History  Substance Use Topics  . Smoking status: Current Every Day Smoker -- 0.50 packs/day for 14 years    Types: Cigarettes  . Smokeless tobacco: Never Used  . Alcohol Use: No   OB History    Gravida Para Term Preterm AB TAB SAB Ectopic Multiple Living   4    3  3   1       Review of Systems  10 systems reviewed and found to be negative, except as noted in the HPI.   Allergies  Latex; Levofloxacin; Moxifloxacin; Oxycodone-acetaminophen; Peach; Potassium-containing compounds; Prednisone; Propoxyphene n-acetaminophen; Rosiglitazone maleate; Xolair; Adhesive; Morphine and related; Prozac; Clindamycin/lincomycin; Septra; Cefaclor; Keflex; Promethazine hcl; and Sulfadiazine  Home Medications   Prior to Admission medications   Medication Sig Start Date End Date Taking? Authorizing Provider  Adalimumab (HUMIRA) 40 MG/0.8ML PSKT Inject 40 mg into the skin every Monday.    Yes Historical Provider, MD  doxycycline (VIBRA-TABS) 100 MG tablet Take 1 tablet (100 mg total) by mouth 2 (two) times daily. Patient taking differently: Take 100 mg by mouth 2 (two) times daily as needed (for skin flare ups).  11/16/15  Yes Deneise Lever, MD  fluconazole (DIFLUCAN) 150 MG tablet Take 1 tablet (150 mg total) by mouth daily. Patient taking differently: Take 150 mg by mouth daily as needed (for yeast infections). Takes when she takes doxycycline 11/16/15  Yes Deneise Lever, MD  insulin glargine (LANTUS) 100 UNIT/ML injection Inject 50 Units into the skin at bedtime as needed. *Only use when insulin pump goes out* 05/10/13  Yes Charlynne Cousins, MD  Insulin Human (INSULIN PUMP) SOLN Inject into the skin continuous. *Novolog*   Yes Historical Provider, MD  levonorgestrel (MIRENA) 20 MCG/24HR IUD 1 each by Intrauterine route once. Placed 04/2013   Yes Historical Provider, MD  pantoprazole (PROTONIX) 40 MG tablet Take 1 tablet (40 mg total) by mouth daily. 01/08/16  Yes Deneise Lever, MD  amoxicillin-clavulanate (AUGMENTIN) 875-125 MG tablet Take 1 tablet by mouth 2 (two) times daily. Patient not taking: Reported on 11/27/2015 10/30/15   Deneise Lever, MD  traMADol (ULTRAM) 50 MG tablet 1 or 2 tabs every 6 hours as needed for pain Patient not taking: Reported on 03/17/2016 01/23/16    Deneise Lever, MD   BP 101/70 mmHg  Pulse 73  Temp(Src) 98.2 F (36.8 C) (Oral)  Resp 18  Ht 5\' 5"  (1.651 m)  Wt 103.42 kg  BMI 37.94 kg/m2  SpO2 97% Physical Exam  Constitutional: She is oriented to person, place, and time. She appears well-developed and well-nourished. No distress.  Obese  HENT:  Head: Normocephalic.  Eyes: Conjunctivae and EOM are normal.  Cardiovascular: Normal rate and regular rhythm.   Pulmonary/Chest: Effort normal and breath sounds normal. No stridor. No respiratory distress. She has no wheezes. She has no rales. She exhibits no tenderness.  Abdominal: Soft. Bowel sounds are normal. She exhibits no distension and no mass. There is no tenderness. There is no rebound and no guarding.  Musculoskeletal: Normal range of motion.  Neurological: She is alert and oriented to person, place, and  time.  Skin: Rash noted.  Multiple abscesses to perennial area some actively draining, some indurated.  Psychiatric: She has a normal mood and affect.  Nursing note and vitals reviewed.   ED Course  Procedures (including critical care time) Labs Review Labs Reviewed  COMPREHENSIVE METABOLIC PANEL - Abnormal; Notable for the following:    Sodium 133 (*)    CO2 19 (*)    Glucose, Bld 449 (*)    All other components within normal limits  URINALYSIS, ROUTINE W REFLEX MICROSCOPIC (NOT AT [facility]) - Abnormal; Notable for the following:    Specific Gravity, Urine 1.041 (*)    Glucose, UA >1000 (*)    Ketones, ur 15 (*)    All other components within normal limits  URINE MICROSCOPIC-ADD ON - Abnormal; Notable for the following:    Squamous Epithelial / LPF 0-5 (*)    Bacteria, UA FEW (*)    All other components within normal limits  BLOOD GAS, VENOUS - Abnormal; Notable for the following:    pH, Ven 7.349 (*)    pCO2, Ven 40.9 (*)    Acid-base deficit 3.0 (*)    All other components within normal limits  CBG MONITORING, ED - Abnormal; Notable for the following:     Glucose-Capillary 410 (*)    All other components within normal limits  CBG MONITORING, ED - Abnormal; Notable for the following:    Glucose-Capillary 332 (*)    All other components within normal limits  CULTURE, BLOOD (ROUTINE X 2)  CULTURE, BLOOD (ROUTINE X 2)  CBC  I-STAT CG4 LACTIC ACID, ED  I-STAT CG4 LACTIC ACID, ED    Imaging Review No results found. I have personally reviewed and evaluated these images and lab results as part of my medical decision-making.   EKG Interpretation None      MDM   Final diagnoses:  Hyperglycemia  Abscess    Filed Vitals:   03/17/16 0910 03/17/16 1332  BP: 115/79 101/70  Pulse: 105 73  Temp: 98.4 F (36.9 C) 98.2 F (36.8 C)  TempSrc: Oral Oral  Resp: 18 18  Height: 5\' 5"  (1.651 m)   Weight: 103.42 kg   SpO2: 100% 97%    Medications  ondansetron (ZOFRAN) injection 4 mg (4 mg Intravenous Given 03/17/16 0953)  sodium chloride 0.9 % bolus 2,000 mL (0 mLs Intravenous Stopped 03/17/16 1347)  HYDROmorphone (DILAUDID) injection 0.5 mg (0.5 mg Intravenous Given 03/17/16 1030)  lidocaine-EPINEPHrine (XYLOCAINE-EPINEPHrine) 1 %-1:200000 (PF) injection 20 mL (20 mLs Other Given 03/17/16 1129)  vancomycin (VANCOCIN) IVPB 1000 mg/200 mL premix (0 mg Intravenous Stopped 03/17/16 1327)    [name] is 35 y.o. female sent from primary care for evaluation of hyperglycemia and MRSA positive hidradenitis. She is having an outbreak with multiple lesions in the perineal area, they're actively draining, one was opened by her dermatologist at [facility]. Of concern, patient's blood sugar has been difficult to control, creeping in the 300s and 400s at home. She is compliant with her insulin has an insulin pump, several episodes of emesis at home.  Ultrasound are drawn and pending, lactic acid is normal, she has no leukocytosis. Her glucoses 449 with a normal anion gap, bicarbonate is mildly decreased at 19. 15 ketones in the urine. I would like to I and  D the lesions however patient declines. Attending physician has evaluated the patient and would like an observation admission for IV antibiotics given her hyperglycemia and low bicarbonate setting of active infection.  Discussed with Dr. Tyrell Antonio, patient will restart her insulin pump, she will have diabetes educator manage the pump on the floor. Request surgery consult as patient will not allow me to perform IND.  Case discussed with PA Creig Hines of surgery: He will evaluate the patient     Monico Blitz, PA-C 03/17/16 [place], MD 03/17/16 [phone]

## 2016-03-17 NOTE — Progress Notes (Addendum)
Both of pts IV's infiltrated.  IV team unable to get new one. Pt still needing the rest of her vancomycin and rest of 53ml bolus.   MD made aware.  Will make night RN aware per MD request for night IV team to try again.

## 2016-03-17 NOTE — ED Notes (Signed)
Patient turned her insulin pump off as requested by EDPA.

## 2016-03-17 NOTE — ED Notes (Signed)
Pt states that she has hidradenitis suppurativa.  Pt states that she has had her wounds cultured and they came back MRSA. Pt is allergic to clindamycin and septra and was told that the only option for her was to come to the ER and be admitted.  Pt describes abscesses to back of left leg, inner rt thigh, lt groin area, under rt arm.  Pt is T1 diabetic with an insulin pump.  States her CBGs have been all over the place.  Also has been having NVD, chillls, and dizziness.  No fever at this time.  No antipyretics today.

## 2016-03-17 NOTE — Progress Notes (Signed)
Inpatient Diabetes Program Recommendations  AACE/ADA: New Consensus Statement on Inpatient Glycemic Control (2015)  Target Ranges:  Prepandial:   less than 140 mg/dL      Peak postprandial:   less than 180 mg/dL (1-2 hours)      Critically ill patients:  140 - 180 mg/dL   Lab Results  Component Value Date   GLUCAP 332* 03/17/2016   HGBA1C 10.2* 06/01/2014  Results for HILA, BOLDING (MRN [id number]) as of 03/17/2016 16:05  Ref. Range 03/17/2016 10:00 03/17/2016 13:31 03/17/2016 15:43  Glucose-Capillary Latest Ref Range: 65-99 mg/dL 410 (H) 332 (H) 332 (H)    Review of Glycemic Control  Diabetes history: DM1 Outpatient Diabetes medications: Insulin pump - see settings below Current orders for Inpatient glycemic control: Insulin pump - pt to manage Pt in [facility] Diabetes program.  Insulin Pump Settings: Basal - 1.5 units/hour - Total basal insulin/24H - 36 units Bolus - Goal: 0530 - 1830 - 120 mg/dL; 1830 - 0530 - 100 mg/dL CHO ratio - 1:7 Insulin Sensitivity - 27   Results for BEA, DUREN (MRN [id number]) as of 03/17/2016 16:05  Ref. Range 03/17/2016 09:32  Sodium Latest Ref Range: 135-145 mmol/L 133 (L)  Potassium Latest Ref Range: 3.5-5.1 mmol/L 4.0  Chloride Latest Ref Range: 101-111 mmol/L 106  CO2 Latest Ref Range: 22-32 mmol/L 19 (L)  BUN Latest Ref Range: 6-20 mg/dL 17  Creatinine Latest Ref Range: 0.44-1.00 mg/dL 0.73  Calcium Latest Ref Range: 8.9-10.3 mg/dL 8.9  EGFR (Non-African Amer.) Latest Ref Range: >60 mL/min >60  EGFR (African American) Latest Ref Range: >60 mL/min >60  Glucose Latest Ref Range: 65-99 mg/dL 449 (H)  Anion gap Latest Ref Range: 5-15  8   Not in DKA at this time.  To repeat BMET.  Inpatient Diabetes Program Recommendations:    Restarted insulin pump per home settings. Correction Q4H with bolus parameters. Diabetes Coordinator to have pt sign contract and record CHO/boluses on worksheet provided. (RN will document under  insulin pump flowsheet.)  Will continue to monitor.  Thank you. Lorenda Peck, RD, LDN, CDE Inpatient Diabetes Coordinator [phone]

## 2016-03-18 DIAGNOSIS — L0291 Cutaneous abscess, unspecified: Secondary | ICD-10-CM | POA: Diagnosis not present

## 2016-03-18 DIAGNOSIS — E1065 Type 1 diabetes mellitus with hyperglycemia: Secondary | ICD-10-CM | POA: Diagnosis not present

## 2016-03-18 DIAGNOSIS — B373 Candidiasis of vulva and vagina: Secondary | ICD-10-CM | POA: Diagnosis not present

## 2016-03-18 DIAGNOSIS — F17210 Nicotine dependence, cigarettes, uncomplicated: Secondary | ICD-10-CM | POA: Diagnosis not present

## 2016-03-18 DIAGNOSIS — L732 Hidradenitis suppurativa: Secondary | ICD-10-CM | POA: Diagnosis not present

## 2016-03-18 DIAGNOSIS — R197 Diarrhea, unspecified: Secondary | ICD-10-CM | POA: Diagnosis not present

## 2016-03-18 DIAGNOSIS — E109 Type 1 diabetes mellitus without complications: Secondary | ICD-10-CM | POA: Diagnosis not present

## 2016-03-18 DIAGNOSIS — F329 Major depressive disorder, single episode, unspecified: Secondary | ICD-10-CM | POA: Diagnosis not present

## 2016-03-18 DIAGNOSIS — B9562 Methicillin resistant Staphylococcus aureus infection as the cause of diseases classified elsewhere: Secondary | ICD-10-CM | POA: Diagnosis not present

## 2016-03-18 DIAGNOSIS — L02214 Cutaneous abscess of groin: Secondary | ICD-10-CM | POA: Diagnosis not present

## 2016-03-18 DIAGNOSIS — R112 Nausea with vomiting, unspecified: Secondary | ICD-10-CM | POA: Diagnosis not present

## 2016-03-18 LAB — GLUCOSE, CAPILLARY
GLUCOSE-CAPILLARY: 104 mg/dL — AB (ref 65–99)
GLUCOSE-CAPILLARY: 129 mg/dL — AB (ref 65–99)
GLUCOSE-CAPILLARY: 177 mg/dL — AB (ref 65–99)
GLUCOSE-CAPILLARY: 178 mg/dL — AB (ref 65–99)
GLUCOSE-CAPILLARY: 197 mg/dL — AB (ref 65–99)
Glucose-Capillary: 194 mg/dL — ABNORMAL HIGH (ref 65–99)

## 2016-03-18 LAB — CBC
HEMATOCRIT: 34.8 % — AB (ref 36.0–46.0)
HEMOGLOBIN: 11.8 g/dL — AB (ref 12.0–15.0)
MCH: 30.6 pg (ref 26.0–34.0)
MCHC: 33.9 g/dL (ref 30.0–36.0)
MCV: 90.2 fL (ref 78.0–100.0)
Platelets: 207 10*3/uL (ref 150–400)
RBC: 3.86 MIL/uL — AB (ref 3.87–5.11)
RDW: 13 % (ref 11.5–15.5)
WBC: 9.1 10*3/uL (ref 4.0–10.5)

## 2016-03-18 LAB — BASIC METABOLIC PANEL
ANION GAP: 4 — AB (ref 5–15)
BUN: 10 mg/dL (ref 6–20)
CALCIUM: 7.8 mg/dL — AB (ref 8.9–10.3)
CO2: 20 mmol/L — AB (ref 22–32)
Chloride: 113 mmol/L — ABNORMAL HIGH (ref 101–111)
Creatinine, Ser: 0.39 mg/dL — ABNORMAL LOW (ref 0.44–1.00)
GLUCOSE: 119 mg/dL — AB (ref 65–99)
POTASSIUM: 3.5 mmol/L (ref 3.5–5.1)
Sodium: 137 mmol/L (ref 135–145)

## 2016-03-18 MED ORDER — ENOXAPARIN SODIUM 60 MG/0.6ML ~~LOC~~ SOLN
50.0000 mg | SUBCUTANEOUS | Status: DC
  Administered 2016-03-18: 50 mg via SUBCUTANEOUS
  Filled 2016-03-18 (×2): qty 0.6

## 2016-03-18 NOTE — Progress Notes (Signed)
Results for PERCY, COMP (MRN [id number]) as of 03/18/2016 09:07  Ref. Range 03/17/2016 20:41 03/17/2016 22:51 03/17/2016 23:35 03/18/2016 02:51 03/18/2016 07:28  Glucose-Capillary Latest Ref Range: 65-99 mg/dL 225 (H) 160 (H) 149 (H) 129 (H) 104 (H)  Results for MAYMUNA, DETZEL (MRN [id number]) as of 03/18/2016 09:07  Ref. Range 03/18/2016 05:09  Sodium Latest Ref Range: 135-145 mmol/L 137  Potassium Latest Ref Range: 3.5-5.1 mmol/L 3.5  Chloride Latest Ref Range: 101-111 mmol/L 113 (H)  CO2 Latest Ref Range: 22-32 mmol/L 20 (L)  BUN Latest Ref Range: 6-20 mg/dL 10  Creatinine Latest Ref Range: 0.44-1.00 mg/dL 0.39 (L)  Calcium Latest Ref Range: 8.9-10.3 mg/dL 7.8 (L)  EGFR (Non-African Amer.) Latest Ref Range: >60 mL/min >60  EGFR (African American) Latest Ref Range: >60 mL/min >60  Glucose Latest Ref Range: 65-99 mg/dL 119 (H)  Anion gap Latest Ref Range: 5-15  4 (L)   On insulin pump per home. Blood sugars trending well.  Eating CHO mod diet. No c/o. To change pump site on 6/28. Has supplies from home.  Will continue to follow. Thank you. Lorenda Peck, RD, LDN, CDE Inpatient Diabetes Coordinator [phone]

## 2016-03-18 NOTE — Progress Notes (Signed)
Patient states she cleaned groin as agreed upon through the night. She has agreed to continue this according to the care order

## 2016-03-18 NOTE — Progress Notes (Signed)
Hidradenitis  Subjective: Pt still with pain but improving somewhat  Objective: Vital signs in last 24 hours: Temp:  [98.2 F (36.8 C)-98.9 F (37.2 C)] 98.9 F (37.2 C) (06/27 0509) Pulse Rate:  [72-105] 72 (06/27 0509) Resp:  [18] 18 (06/27 0509) BP: (101-115)/(62-79) 101/69 mmHg (06/27 0509) SpO2:  [97 %-100 %] 99 % (06/27 0509) Weight:  [103.42 kg (228 lb)-104 kg (229 lb 4.5 oz)] 104 kg (229 lb 4.5 oz) (06/26 1642) Last BM Date: 03/17/16  Intake/Output from previous day: 06/26 0701 - 06/27 0700 In: 4770 [P.O.:200; I.V.:920; IV Piggyback:2650] Out: 1300 [Urine:1300] Intake/Output this shift:    General appearance: alert and cooperative Incision/Wound: no fluctuance, min cellulitis  Lab Results:  Results for orders placed or performed during the hospital encounter of 03/17/16 (from the past 24 hour(s))  Comprehensive metabolic panel     Status: Abnormal   Collection Time: 03/17/16  9:32 AM  Result Value Ref Range   Sodium 133 (L) 135 - 145 mmol/L   Potassium 4.0 3.5 - 5.1 mmol/L   Chloride 106 101 - 111 mmol/L   CO2 19 (L) 22 - 32 mmol/L   Glucose, Bld 449 (H) 65 - 99 mg/dL   BUN 17 6 - 20 mg/dL   Creatinine, Ser 0.73 0.44 - 1.00 mg/dL   Calcium 8.9 8.9 - 10.3 mg/dL   Total Protein 7.5 6.5 - 8.1 g/dL   Albumin 4.2 3.5 - 5.0 g/dL   AST 16 15 - 41 U/L   ALT 20 14 - 54 U/L   Alkaline Phosphatase 78 38 - 126 U/L   Total Bilirubin 0.4 0.3 - 1.2 mg/dL   GFR calc non Af Amer >60 >60 mL/min   GFR calc Af Amer >60 >60 mL/min   Anion gap 8 5 - 15  CBC     Status: None   Collection Time: 03/17/16  9:32 AM  Result Value Ref Range   WBC 9.6 4.0 - 10.5 K/uL   RBC 4.69 3.87 - 5.11 MIL/uL   Hemoglobin 14.8 12.0 - 15.0 g/dL   HCT 42.8 36.0 - 46.0 %   MCV 91.3 78.0 - 100.0 fL   MCH 31.6 26.0 - 34.0 pg   MCHC 34.6 30.0 - 36.0 g/dL   RDW 12.8 11.5 - 15.5 %   Platelets 291 150 - 400 K/uL  CBG monitoring, ED     Status: Abnormal   Collection Time: 03/17/16 10:00 AM  Result  Value Ref Range   Glucose-Capillary 410 (H) 65 - 99 mg/dL  Urinalysis, Routine w reflex microscopic     Status: Abnormal   Collection Time: 03/17/16 10:01 AM  Result Value Ref Range   Color, Urine YELLOW YELLOW   APPearance CLEAR CLEAR   Specific Gravity, Urine 1.041 (H) 1.005 - 1.030   pH 5.0 5.0 - 8.0   Glucose, UA >1000 (A) NEGATIVE mg/dL   Hgb urine dipstick NEGATIVE NEGATIVE   Bilirubin Urine NEGATIVE NEGATIVE   Ketones, ur 15 (A) NEGATIVE mg/dL   Protein, ur NEGATIVE NEGATIVE mg/dL   Nitrite NEGATIVE NEGATIVE   Leukocytes, UA NEGATIVE NEGATIVE  Urine microscopic-add on     Status: Abnormal   Collection Time: 03/17/16 10:01 AM  Result Value Ref Range   Squamous Epithelial / LPF 0-5 (A) NONE SEEN   WBC, UA 0-5 0 - 5 WBC/hpf   RBC / HPF 0-5 0 - 5 RBC/hpf   Bacteria, UA FEW (A) NONE SEEN  I-Stat CG4 Lactic Acid, ED  Status: None   Collection Time: 03/17/16 10:34 AM  Result Value Ref Range   Lactic Acid, Venous 0.59 0.5 - 2.0 mmol/L  Blood gas, venous     Status: Abnormal   Collection Time: 03/17/16 11:10 AM  Result Value Ref Range   pH, Ven 7.349 (H) 7.250 - 7.300   pCO2, Ven 40.9 (L) 45.0 - 50.0 mmHg   pO2, Ven 39.8 31.0 - 45.0 mmHg   Bicarbonate 21.9 20.0 - 24.0 mEq/L   TCO2 19.5 0 - 100 mmol/L   Acid-base deficit 3.0 (H) 0.0 - 2.0 mmol/L   O2 Saturation 72.9 %   Patient temperature 98.6    Collection site COLLECTED BY NURSE    Drawn by [phone]    Sample type VEIN   POC CBG, ED     Status: Abnormal   Collection Time: 03/17/16  1:31 PM  Result Value Ref Range   Glucose-Capillary 332 (H) 65 - 99 mg/dL  I-Stat CG4 Lactic Acid, ED     Status: None   Collection Time: 03/17/16  1:57 PM  Result Value Ref Range   Lactic Acid, Venous 0.88 0.5 - 2.0 mmol/L  POC CBG, ED     Status: Abnormal   Collection Time: 03/17/16  3:43 PM  Result Value Ref Range   Glucose-Capillary 332 (H) 65 - 99 mg/dL  Glucose, capillary     Status: Abnormal   Collection Time: 03/17/16  5:09 PM   Result Value Ref Range   Glucose-Capillary 301 (H) 65 - 99 mg/dL  Basic metabolic panel     Status: Abnormal   Collection Time: 03/17/16  5:49 PM  Result Value Ref Range   Sodium 137 135 - 145 mmol/L   Potassium 3.8 3.5 - 5.1 mmol/L   Chloride 109 101 - 111 mmol/L   CO2 20 (L) 22 - 32 mmol/L   Glucose, Bld 269 (H) 65 - 99 mg/dL   BUN 13 6 - 20 mg/dL   Creatinine, Ser 0.50 0.44 - 1.00 mg/dL   Calcium 8.1 (L) 8.9 - 10.3 mg/dL   GFR calc non Af Amer >60 >60 mL/min   GFR calc Af Amer >60 >60 mL/min   Anion gap 8 5 - 15  MRSA PCR Screening     Status: None   Collection Time: 03/17/16  6:28 PM  Result Value Ref Range   MRSA by PCR NEGATIVE NEGATIVE  Glucose, capillary     Status: Abnormal   Collection Time: 03/17/16  8:41 PM  Result Value Ref Range   Glucose-Capillary 225 (H) 65 - 99 mg/dL  Glucose, capillary     Status: Abnormal   Collection Time: 03/17/16 10:51 PM  Result Value Ref Range   Glucose-Capillary 160 (H) 65 - 99 mg/dL  Glucose, capillary     Status: Abnormal   Collection Time: 03/17/16 11:35 PM  Result Value Ref Range   Glucose-Capillary 149 (H) 65 - 99 mg/dL  Glucose, capillary     Status: Abnormal   Collection Time: 03/18/16  2:51 AM  Result Value Ref Range   Glucose-Capillary 129 (H) 65 - 99 mg/dL  Basic metabolic panel     Status: Abnormal   Collection Time: 03/18/16  5:09 AM  Result Value Ref Range   Sodium 137 135 - 145 mmol/L   Potassium 3.5 3.5 - 5.1 mmol/L   Chloride 113 (H) 101 - 111 mmol/L   CO2 20 (L) 22 - 32 mmol/L   Glucose, Bld 119 (H) 65 - 99  mg/dL   BUN 10 6 - 20 mg/dL   Creatinine, Ser 0.39 (L) 0.44 - 1.00 mg/dL   Calcium 7.8 (L) 8.9 - 10.3 mg/dL   GFR calc non Af Amer >60 >60 mL/min   GFR calc Af Amer >60 >60 mL/min   Anion gap 4 (L) 5 - 15  CBC     Status: Abnormal   Collection Time: 03/18/16  5:09 AM  Result Value Ref Range   WBC 9.1 4.0 - 10.5 K/uL   RBC 3.86 (L) 3.87 - 5.11 MIL/uL   Hemoglobin 11.8 (L) 12.0 - 15.0 g/dL   HCT 34.8  (L) 36.0 - 46.0 %   MCV 90.2 78.0 - 100.0 fL   MCH 30.6 26.0 - 34.0 pg   MCHC 33.9 30.0 - 36.0 g/dL   RDW 13.0 11.5 - 15.5 %   Platelets 207 150 - 400 K/uL  Glucose, capillary     Status: Abnormal   Collection Time: 03/18/16  7:28 AM  Result Value Ref Range   Glucose-Capillary 104 (H) 65 - 99 mg/dL   Comment 1 Notify RN      Studies/Results Radiology     MEDS, Scheduled . Chlorhexidine Gluconate Cloth  6 each Topical Q0600  . enoxaparin (LOVENOX) injection  50 mg Subcutaneous Q24H  . insulin pump   Subcutaneous TID AC, HS, 0200  . mupirocin ointment  1 application Nasal BID  . pantoprazole  40 mg Oral Daily  . sodium chloride flush  3 mL Intravenous Q12H  . vancomycin  1,000 mg Intravenous Q8H     Assessment: Hidradenitis Type 1 DM  Plan: Continue antibiotics.  No further surgery indicated.  Most likely could go home tom or Thu on PO abx.        Rosario Adie, [facility], [place] [phone]   03/18/2016 8:49 AM

## 2016-03-18 NOTE — Progress Notes (Signed)
[facility] PROGRESS NOTE    Progress Note  [name]  [id number] DOB: 02-May-1981 DOA: 03/17/2016 PCP: Vidal Schwalbe, MD     Brief Narrative:   [name] is an 35 y.o. female past medical history of esophagitis is improved to the, insulin-dependent diabetes mellitus on an insulin pump who presents with difficult to control blood sugars at home underwent IND on the right groin by her dermatologist on 03/12/2016, she receiving enough from her endocrinologist data crit MRSA.  Assessment/Plan:   Active Problems:   Type 1 diabetes mellitus (HCC)   Hidradenitis suppurativa   Hyperglycemia   Abscess She has remained afebrile with no leukocytosis will continue medical management. Surgery was consulted and agreed on medical management, will continue IV antibiotics vancomycin. Spoke to her about her allergies of Bactrim and clindamycin and his symptoms that she related were vague. She'll continue to manage her insulin pump she is on very well. He will be able to try her on oral antibiotics and monitor for 24 hours before DC. She's had no further loose stools.   DVT prophylaxis: lovenox Family Communication:none Disposition Plan/Barrier to D/C: home 1-2 dyas Code Status:     Code Status Orders        Start     Ordered   03/17/16 1638  Full code   Continuous     03/17/16 1637    Code Status History    Date Active Date Inactive Code Status Order ID Comments User Context   09/22/2014  4:16 PM 09/25/2014  8:33 PM Full Code [id number]  Cristal Ford, DO Inpatient   07/14/2014 11:09 AM 07/14/2014  4:13 PM Full Code [id number]  Burnell Blanks, MD Inpatient   06/01/2014  1:43 PM 06/04/2014  1:24 PM Full Code [id number]  Cristal Ford, DO Inpatient   07/12/2012  5:27 PM 07/14/2012  5:22 PM Full Code [id number]  Hubert Azure, RN Inpatient        IV Access:    Peripheral IV   Procedures and diagnostic studies:   No results found.   Medical  Consultants:    None.  Anti-Infectives:   Vanc  Subjective:    [name] no complaints feels good today. Tolerating her diet. Stools.  Objective:    Filed Vitals:   03/17/16 1332 03/17/16 1642 03/17/16 2042 03/18/16 0509  BP: 101/70 106/70 103/62 101/69  Pulse: 73 78 84 72  Temp: 98.2 F (36.8 C) 98.7 F (37.1 C) 98.4 F (36.9 C) 98.9 F (37.2 C)  TempSrc: Oral Oral Oral Oral  Resp: 18 18 18 18   Height:  5\' 5"  (1.651 m)    Weight:  104 kg (229 lb 4.5 oz)    SpO2: 97% 100% 99% 99%    Intake/Output Summary (Last 24 hours) at 03/18/16 1032 Last data filed at 03/18/16 0646  Gross per 24 hour  Intake   4770 ml  Output   1300 ml  Net   3470 ml   Filed Weights   03/17/16 0910 03/17/16 1642  Weight: 103.42 kg (228 lb) 104 kg (229 lb 4.5 oz)    Exam: General exam: In no acute distress. Respiratory system: Good air movement and clear to auscultation. Cardiovascular system: S1 & S2 heard, RRR.  Gastrointestinal system: Abdomen is nondistended, soft and nontender.  Central nervous system: Alert and oriented. No focal neurological deficits. Extremities: No pedal edema. Skin: Multiple scars from drainage of abscesses, stable coronary is tender to palpation and erythematous. Psychiatry: Judgement  and insight appear normal. Mood & affect appropriate.    Data Reviewed:    Labs: Basic Metabolic Panel:  Recent Labs Lab 03/17/16 0932 03/17/16 1749 03/18/16 0509  NA 133* 137 137  K 4.0 3.8 3.5  CL 106 109 113*  CO2 19* 20* 20*  GLUCOSE 449* 269* 119*  BUN 17 13 10   CREATININE 0.73 0.50 0.39*  CALCIUM 8.9 8.1* 7.8*   GFR Estimated Creatinine Clearance: 118.6 mL/min (by C-G formula based on Cr of 0.39). Liver Function Tests:  Recent Labs Lab 03/17/16 0932  AST 16  ALT 20  ALKPHOS 78  BILITOT 0.4  PROT 7.5  ALBUMIN 4.2   No results for input(s): LIPASE, AMYLASE in the last 168 hours. No results for input(s): AMMONIA in the last 168  hours. Coagulation profile No results for input(s): INR, PROTIME in the last 168 hours.  CBC:  Recent Labs Lab 03/17/16 0932 03/18/16 0509  WBC 9.6 9.1  HGB 14.8 11.8*  HCT 42.8 34.8*  MCV 91.3 90.2  PLT 291 207   Cardiac Enzymes: No results for input(s): CKTOTAL, CKMB, CKMBINDEX, TROPONINI in the last 168 hours. BNP (last 3 results) No results for input(s): PROBNP in the last 8760 hours. CBG:  Recent Labs Lab 03/17/16 2041 03/17/16 2251 03/17/16 2335 03/18/16 0251 03/18/16 0728  GLUCAP 225* 160* 149* 129* 104*   D-Dimer: No results for input(s): DDIMER in the last 72 hours. Hgb A1c: No results for input(s): HGBA1C in the last 72 hours. Lipid Profile: No results for input(s): CHOL, HDL, LDLCALC, TRIG, CHOLHDL, LDLDIRECT in the last 72 hours. Thyroid function studies: No results for input(s): TSH, T4TOTAL, T3FREE, THYROIDAB in the last 72 hours.  Invalid input(s): FREET3 Anemia work up: No results for input(s): VITAMINB12, FOLATE, FERRITIN, TIBC, IRON, RETICCTPCT in the last 72 hours. Sepsis Labs:  Recent Labs Lab 03/17/16 0932 03/17/16 1034 03/17/16 1357 03/18/16 0509  WBC 9.6  --   --  9.1  LATICACIDVEN  --  0.59 0.88  --    Microbiology Recent Results (from the past 240 hour(s))  MRSA PCR Screening     Status: None   Collection Time: 03/17/16  6:28 PM  Result Value Ref Range Status   MRSA by PCR NEGATIVE NEGATIVE Final    Comment:        The GeneXpert MRSA Assay (FDA approved for NASAL specimens only), is one component of a comprehensive MRSA colonization surveillance program. It is not intended to diagnose MRSA infection nor to guide or monitor treatment for MRSA infections.      Medications:   . Chlorhexidine Gluconate Cloth  6 each Topical Q0600  . enoxaparin (LOVENOX) injection  50 mg Subcutaneous Q24H  . insulin pump   Subcutaneous TID AC, HS, 0200  . mupirocin ointment  1 application Nasal BID  . pantoprazole  40 mg Oral Daily   . sodium chloride flush  3 mL Intravenous Q12H  . vancomycin  1,000 mg Intravenous Q8H   Continuous Infusions: . sodium chloride 100 mL/hr at 03/18/16 0225    Time spent: 25 min     [name]  [facility] Pager [phone]  *Please refer to [email or web address], password [email or web address] to get updated schedule on who will round on this patient, as hospitalists switch teams weekly. If 7PM-7AM, please contact night-coverage at [email or web address], password [email or web address] for any overnight needs.  03/18/2016, 10:32 AM

## 2016-03-19 DIAGNOSIS — E109 Type 1 diabetes mellitus without complications: Secondary | ICD-10-CM | POA: Diagnosis not present

## 2016-03-19 DIAGNOSIS — R112 Nausea with vomiting, unspecified: Secondary | ICD-10-CM | POA: Diagnosis not present

## 2016-03-19 DIAGNOSIS — L732 Hidradenitis suppurativa: Secondary | ICD-10-CM | POA: Diagnosis not present

## 2016-03-19 DIAGNOSIS — F17200 Nicotine dependence, unspecified, uncomplicated: Secondary | ICD-10-CM

## 2016-03-19 DIAGNOSIS — F17210 Nicotine dependence, cigarettes, uncomplicated: Secondary | ICD-10-CM | POA: Diagnosis not present

## 2016-03-19 DIAGNOSIS — L02214 Cutaneous abscess of groin: Secondary | ICD-10-CM | POA: Diagnosis not present

## 2016-03-19 DIAGNOSIS — L0291 Cutaneous abscess, unspecified: Secondary | ICD-10-CM | POA: Diagnosis not present

## 2016-03-19 DIAGNOSIS — F329 Major depressive disorder, single episode, unspecified: Secondary | ICD-10-CM | POA: Diagnosis not present

## 2016-03-19 DIAGNOSIS — B9562 Methicillin resistant Staphylococcus aureus infection as the cause of diseases classified elsewhere: Secondary | ICD-10-CM | POA: Diagnosis not present

## 2016-03-19 DIAGNOSIS — B373 Candidiasis of vulva and vagina: Secondary | ICD-10-CM | POA: Diagnosis not present

## 2016-03-19 DIAGNOSIS — R197 Diarrhea, unspecified: Secondary | ICD-10-CM | POA: Diagnosis not present

## 2016-03-19 DIAGNOSIS — E1065 Type 1 diabetes mellitus with hyperglycemia: Secondary | ICD-10-CM | POA: Diagnosis not present

## 2016-03-19 LAB — GLUCOSE, CAPILLARY
GLUCOSE-CAPILLARY: 173 mg/dL — AB (ref 65–99)
GLUCOSE-CAPILLARY: 264 mg/dL — AB (ref 65–99)
Glucose-Capillary: 193 mg/dL — ABNORMAL HIGH (ref 65–99)

## 2016-03-19 MED ORDER — FLUCONAZOLE 150 MG PO TABS: 150.0000 mg | ORAL_TABLET | Freq: Every day | ORAL | Status: DC | PRN

## 2016-03-19 MED ORDER — DOXYCYCLINE HYCLATE 100 MG PO TABS: 100.0000 mg | ORAL_TABLET | Freq: Two times a day (BID) | ORAL | Status: DC

## 2016-03-19 NOTE — Progress Notes (Signed)
  Subjective: She looks good, all the lesions are improving, not much drainage from right thigh while i was in room no dressing in place.  Objective: Vital signs in last 24 hours: Temp:  [98 F (36.7 C)-98.9 F (37.2 C)] 98.7 F (37.1 C) (06/28 0525) Pulse Rate:  [74-85] 79 (06/28 0525) Resp:  [16-18] 16 (06/28 0525) BP: (102-113)/(62-69) 102/62 mmHg (06/28 0525) SpO2:  [99 %-100 %] 99 % (06/28 0525) Last BM Date: 03/18/16 240 PO No labs/films Afebrile, VSS Intake/Output from previous day: 06/27 0701 - 06/28 0700 In: 2763.3 [P.O.:240; I.V.:2323.3; IV Piggyback:200] Out: -  Intake/Output this shift:    General appearance: alert, cooperative and no distress Skin: all the lesion are better, no drainage noted from right thigh.    Lab Results:   Recent Labs  03/17/16 0932 03/18/16 0509  WBC 9.6 9.1  HGB 14.8 11.8*  HCT 42.8 34.8*  PLT 291 207    BMET  Recent Labs  03/17/16 1749 03/18/16 0509  NA 137 137  K 3.8 3.5  CL 109 113*  CO2 20* 20*  GLUCOSE 269* 119*  BUN 13 10  CREATININE 0.50 0.39*  CALCIUM 8.1* 7.8*   PT/INR No results for input(s): LABPROT, INR in the last 72 hours.   Recent Labs Lab 03/17/16 0932  AST 16  ALT 20  ALKPHOS 78  BILITOT 0.4  PROT 7.5  ALBUMIN 4.2     Lipase     Component Value Date/Time   LIPASE 21 10/18/2014 1338     Studies/Results: No results found.  Medications: . Chlorhexidine Gluconate Cloth  6 each Topical Q0600  . enoxaparin (LOVENOX) injection  50 mg Subcutaneous Q24H  . insulin pump   Subcutaneous TID AC, HS, 0200  . mupirocin ointment  1 application Nasal BID  . pantoprazole  40 mg Oral Daily  . sodium chloride flush  3 mL Intravenous Q12H  . vancomycin  1,000 mg Intravenous Q8H   . sodium chloride 100 mL/hr at 03/18/16 2248    Assessment/Plan Hidradenitis Type 1 DM FEN:  Carb mod/IV fluids ID:  Day 3 Vancomycin DVT:  Lovenox/SCD    Plan:  None of the lesions I have seen require surgical  intervention.  I would continue Medical management. Glucose still up some but much better.            Earnstine Regal 03/19/2016 [phone]

## 2016-03-19 NOTE — Discharge Summary (Signed)
Physician Discharge Summary  [name] [id number] DOB: 07/09/1981 DOA: 03/17/2016  PCP: Vidal Schwalbe, MD  Admit date: 03/17/2016 Discharge date: 03/19/2016  Admitted From: home  Disposition:  home   Recommendations for Outpatient Follow-up:  1. Follow up with PCP in 1-2 weeks  Home Health:  noe Equipment/Devices:  none    Discharge Condition:  stable   CODE STATUS:  Full code   Diet recommendation:  Diabetic diet Consultations:  gen surgery    Discharge Diagnoses:  Principal Problem:   Abscess Active Problems:   Hidradenitis suppurativa   Type 1 diabetes mellitus ([place])   SMOKER   Subjective: No complaints   Brief Summary:  abscess in left groin related to hidradenitis suppurativa - previous abscess from thigh grew MRSA- I and D of abscess done and it is resolving- will transition from Vancomycin to Doxycycline today  - under care of dermatologist- receives Humira  - Fluconazole give for related candida vaginitis   DM type 1 - cont Insulin pump  Smoker - advised to stop smoking  Discharge Instructions      Discharge Instructions    Discharge instructions    Complete by:  As directed   Diabetic heart healthy diet     Increase activity slowly    Complete by:  As directed             Medication List    TAKE these medications        doxycycline 100 MG tablet  Commonly known as:  VIBRA-TABS  Take 1 tablet (100 mg total) by mouth 2 (two) times daily.     fluconazole 150 MG tablet  Commonly known as:  DIFLUCAN  Take 1 tablet (150 mg total) by mouth daily as needed (for yeast infections). Takes when she takes doxycycline     HUMIRA 40 MG/0.8ML Pskt  Generic drug:  Adalimumab  Inject 40 mg into the skin every Monday.     insulin glargine 100 UNIT/ML injection  Commonly known as:  LANTUS  Inject 50 Units into the skin at bedtime as needed. *Only use when insulin pump goes out*     insulin pump Soln  Inject into the skin continuous.  *Novolog*     levonorgestrel 20 MCG/24HR IUD  Commonly known as:  MIRENA  1 each by Intrauterine route once. Placed 04/2013     pantoprazole 40 MG tablet  Commonly known as:  PROTONIX  Take 1 tablet (40 mg total) by mouth daily.        Allergies  Allergen Reactions  . Latex Hives, Shortness Of Breath and Rash  . Levofloxacin Shortness Of Breath and Rash  . Moxifloxacin Shortness Of Breath and Rash  . Oxycodone-Acetaminophen Shortness Of Breath, Swelling and Rash    NORCO/VICODIN OK  . Peach [Prunus Persica] Hives and Shortness Of Breath  . Potassium-Containing Compounds Other (See Comments)    IV ROUTE - CAUSES VEINS TO COLLAPS; Reports that it is undiluted K only  . Prednisone Other (See Comments)    SEVERE ELEVATION OF BLOOD SUGAR. Able to tolerate 40 mg  . Propoxyphene N-Acetaminophen Swelling    SWELLING OF FACE AND THROAT  . Rosiglitazone Maleate Swelling    SWELLING OF FACE AND LEGS  . Xolair [Omalizumab] Other (See Comments)    Rash and anaphylaxis  . Adhesive [Tape] Hives, Itching and Rash  . Morphine And Related Other (See Comments)    Causes hallucinations  . Prozac [Fluoxetine Hcl] Other (See Comments)  Made her very aggressive   . Clindamycin/Lincomycin   . Septra [Sulfamethoxazole-Trimethoprim]   . Cefaclor Rash  . Keflex [Cephalexin] Diarrhea and Rash    REACTION: severe migraine  . Promethazine Hcl Other (See Comments)    IV ROUTE ONLY - JITTERY FEELING. Patient reports that it is mild and she has used promethazine since then  PO tablet ok  . Sulfadiazine Rash     Procedures/Studies: No results found.     Discharge Exam: Filed Vitals:   03/18/16 2056 03/19/16 0525  BP: 113/69 102/62  Pulse: 74 79  Temp: 98.9 F (37.2 C) 98.7 F (37.1 C)  Resp: 18 16   Filed Vitals:   03/18/16 0509 03/18/16 1411 03/18/16 2056 03/19/16 0525  BP: 101/69 109/68 113/69 102/62  Pulse: 72 85 74 79  Temp: 98.9 F (37.2 C) 98 F (36.7 C) 98.9 F (37.2 C)  98.7 F (37.1 C)  TempSrc: Oral Oral Oral Oral  Resp: 18 18 18 16   Height:      Weight:      SpO2: 99% 99% 100% 99%    General: Pt is alert, awake, not in acute distress Cardiovascular: RRR, S1/S2 +, no rubs, no gallops Respiratory: CTA bilaterally, no wheezing, no rhonchi Abdominal: Soft, NT, ND, bowel sounds + Extremities: no edema, no cyanosis    The results of significant diagnostics from this hospitalization (including imaging, microbiology, ancillary and laboratory) are listed below for reference.     Microbiology: Recent Results (from the past 240 hour(s))  Blood culture (routine x 2)     Status: None (Preliminary result)   Collection Time: 03/17/16 10:25 AM  Result Value Ref Range Status   Specimen Description BLOOD RIGHT ANTECUBITAL  Final   Special Requests BOTTLES DRAWN AEROBIC AND ANAEROBIC 5ML  Final   Culture   Final    NO GROWTH 1 DAY Performed at [facility]    Report Status PENDING  Incomplete  Blood culture (routine x 2)     Status: None (Preliminary result)   Collection Time: 03/17/16 10:30 AM  Result Value Ref Range Status   Specimen Description BLOOD RIGHT ANTECUBITAL  Final   Special Requests BOTTLES DRAWN AEROBIC AND ANAEROBIC 5ML  Final   Culture   Final    NO GROWTH 1 DAY Performed at [facility]    Report Status PENDING  Incomplete  MRSA PCR Screening     Status: None   Collection Time: 03/17/16  6:28 PM  Result Value Ref Range Status   MRSA by PCR NEGATIVE NEGATIVE Final    Comment:        The GeneXpert MRSA Assay (FDA approved for NASAL specimens only), is one component of a comprehensive MRSA colonization surveillance program. It is not intended to diagnose MRSA infection nor to guide or monitor treatment for MRSA infections.      Labs: BNP (last 3 results) No results for input(s): BNP in the last 8760 hours. Basic Metabolic Panel:  Recent Labs Lab 03/17/16 0932 03/17/16 1749 03/18/16 0509  NA 133* 137  137  K 4.0 3.8 3.5  CL 106 109 113*  CO2 19* 20* 20*  GLUCOSE 449* 269* 119*  BUN 17 13 10   CREATININE 0.73 0.50 0.39*  CALCIUM 8.9 8.1* 7.8*   Liver Function Tests:  Recent Labs Lab 03/17/16 0932  AST 16  ALT 20  ALKPHOS 78  BILITOT 0.4  PROT 7.5  ALBUMIN 4.2   No results for input(s): LIPASE, AMYLASE in  the last 168 hours. No results for input(s): AMMONIA in the last 168 hours. CBC:  Recent Labs Lab 03/17/16 0932 03/18/16 0509  WBC 9.6 9.1  HGB 14.8 11.8*  HCT 42.8 34.8*  MCV 91.3 90.2  PLT 291 207   Cardiac Enzymes: No results for input(s): CKTOTAL, CKMB, CKMBINDEX, TROPONINI in the last 168 hours. BNP: Invalid input(s): POCBNP CBG:  Recent Labs Lab 03/18/16 1603 03/18/16 2054 03/18/16 2344 03/19/16 0354 03/19/16 0808  GLUCAP 197* 178* 177* 173* 193*   D-Dimer No results for input(s): DDIMER in the last 72 hours. Hgb A1c No results for input(s): HGBA1C in the last 72 hours. Lipid Profile No results for input(s): CHOL, HDL, LDLCALC, TRIG, CHOLHDL, LDLDIRECT in the last 72 hours. Thyroid function studies No results for input(s): TSH, T4TOTAL, T3FREE, THYROIDAB in the last 72 hours.  Invalid input(s): FREET3 Anemia work up No results for input(s): VITAMINB12, FOLATE, FERRITIN, TIBC, IRON, RETICCTPCT in the last 72 hours. Urinalysis    Component Value Date/Time   COLORURINE YELLOW 03/17/2016 1001   APPEARANCEUR CLEAR 03/17/2016 1001   LABSPEC 1.041* 03/17/2016 1001   PHURINE 5.0 03/17/2016 1001   GLUCOSEU >1000* 03/17/2016 1001   GLUCOSEU >=1000 07/30/2009 1729   HGBUR NEGATIVE 03/17/2016 1001   BILIRUBINUR NEGATIVE 03/17/2016 1001   KETONESUR 15* 03/17/2016 1001   PROTEINUR NEGATIVE 03/17/2016 1001   UROBILINOGEN 1.0 03/05/2015 1130   NITRITE NEGATIVE 03/17/2016 1001   LEUKOCYTESUR NEGATIVE 03/17/2016 1001   Sepsis Labs Invalid input(s): PROCALCITONIN,  WBC,  LACTICIDVEN Microbiology Recent Results (from the past 240 hour(s))  Blood  culture (routine x 2)     Status: None (Preliminary result)   Collection Time: 03/17/16 10:25 AM  Result Value Ref Range Status   Specimen Description BLOOD RIGHT ANTECUBITAL  Final   Special Requests BOTTLES DRAWN AEROBIC AND ANAEROBIC 5ML  Final   Culture   Final    NO GROWTH 1 DAY Performed at [facility]    Report Status PENDING  Incomplete  Blood culture (routine x 2)     Status: None (Preliminary result)   Collection Time: 03/17/16 10:30 AM  Result Value Ref Range Status   Specimen Description BLOOD RIGHT ANTECUBITAL  Final   Special Requests BOTTLES DRAWN AEROBIC AND ANAEROBIC 5ML  Final   Culture   Final    NO GROWTH 1 DAY Performed at [facility]    Report Status PENDING  Incomplete  MRSA PCR Screening     Status: None   Collection Time: 03/17/16  6:28 PM  Result Value Ref Range Status   MRSA by PCR NEGATIVE NEGATIVE Final    Comment:        The GeneXpert MRSA Assay (FDA approved for NASAL specimens only), is one component of a comprehensive MRSA colonization surveillance program. It is not intended to diagnose MRSA infection nor to guide or monitor treatment for MRSA infections.      Time coordinating discharge: Over 30 minutes  SIGNED:   Debbe Odea, MD  [facility] 03/19/2016, 9:52 AM Pager   If 7PM-7AM, please contact night-coverage [email or web address] Password [email or web address]

## 2016-03-21 ENCOUNTER — Other Ambulatory Visit: Payer: Self-pay | Admitting: *Deleted

## 2016-03-21 ENCOUNTER — Ambulatory Visit: Payer: Self-pay | Admitting: *Deleted

## 2016-03-21 NOTE — Patient Outreach (Signed)
[name] was admitted to hospital from 6/26-6/28 : Principal Problem: Abscess , Active Problems: Hidradenitis suppurativa requiring IV antibiotics. She returned to work on 6/29, states her blood sugars are in good control with  630G [employer] insulin pump. Says she is not wearing the sensor because it was alarming a lot and she didn't feel it was accurate. Says she is on oral antibiotics and feels well. Will arrange for [facility] follow up in 2-3 months. Barrington Ellison RN,CCM,CDE [facility] Coordinator [facility] Office Phone [phone] Office Fax [phone]

## 2016-03-22 LAB — CULTURE, BLOOD (ROUTINE X 2)
CULTURE: NO GROWTH
Culture: NO GROWTH

## 2016-04-03 ENCOUNTER — Telehealth: Payer: Self-pay | Admitting: Internal Medicine

## 2016-04-03 MED ORDER — VILAZODONE HCL 40 MG PO TABS: ORAL_TABLET | ORAL | Status: DC

## 2016-04-03 NOTE — Telephone Encounter (Signed)
Requests script Vibryd

## 2016-04-04 DIAGNOSIS — L732 Hidradenitis suppurativa: Secondary | ICD-10-CM | POA: Diagnosis not present

## 2016-05-05 DIAGNOSIS — E1065 Type 1 diabetes mellitus with hyperglycemia: Secondary | ICD-10-CM | POA: Diagnosis not present

## 2016-06-10 DIAGNOSIS — F17210 Nicotine dependence, cigarettes, uncomplicated: Secondary | ICD-10-CM | POA: Diagnosis not present

## 2016-06-10 DIAGNOSIS — R110 Nausea: Secondary | ICD-10-CM | POA: Diagnosis not present

## 2016-06-10 DIAGNOSIS — R197 Diarrhea, unspecified: Secondary | ICD-10-CM | POA: Diagnosis not present

## 2016-06-10 DIAGNOSIS — Z5181 Encounter for therapeutic drug level monitoring: Secondary | ICD-10-CM | POA: Diagnosis not present

## 2016-06-10 DIAGNOSIS — L732 Hidradenitis suppurativa: Secondary | ICD-10-CM | POA: Diagnosis not present

## 2016-06-11 DIAGNOSIS — E109 Type 1 diabetes mellitus without complications: Secondary | ICD-10-CM | POA: Diagnosis not present

## 2016-08-04 DIAGNOSIS — E109 Type 1 diabetes mellitus without complications: Secondary | ICD-10-CM | POA: Diagnosis not present

## 2016-08-22 ENCOUNTER — Ambulatory Visit
Admission: RE | Admit: 2016-08-22 | Discharge: 2016-08-22 | Disposition: A | Discharge status: Home / Self Care | Payer: 59 | Source: Ambulatory Visit | Attending: Family Medicine | Admitting: Family Medicine

## 2016-08-22 ENCOUNTER — Other Ambulatory Visit: Payer: Self-pay | Admitting: Family Medicine

## 2016-08-22 DIAGNOSIS — R05 Cough: Secondary | ICD-10-CM

## 2016-08-22 DIAGNOSIS — R059 Cough, unspecified: Secondary | ICD-10-CM

## 2016-08-22 DIAGNOSIS — L732 Hidradenitis suppurativa: Secondary | ICD-10-CM | POA: Diagnosis not present

## 2016-08-22 DIAGNOSIS — Z794 Long term (current) use of insulin: Secondary | ICD-10-CM | POA: Diagnosis not present

## 2016-08-22 DIAGNOSIS — E109 Type 1 diabetes mellitus without complications: Secondary | ICD-10-CM | POA: Diagnosis not present

## 2016-09-03 DIAGNOSIS — E1065 Type 1 diabetes mellitus with hyperglycemia: Secondary | ICD-10-CM | POA: Diagnosis not present

## 2016-09-03 DIAGNOSIS — R51 Headache: Secondary | ICD-10-CM | POA: Diagnosis not present

## 2016-09-03 DIAGNOSIS — Z888 Allergy status to other drugs, medicaments and biological substances status: Secondary | ICD-10-CM | POA: Diagnosis not present

## 2016-09-03 DIAGNOSIS — R531 Weakness: Secondary | ICD-10-CM | POA: Diagnosis not present

## 2016-09-03 DIAGNOSIS — Z885 Allergy status to narcotic agent status: Secondary | ICD-10-CM | POA: Diagnosis not present

## 2016-09-03 DIAGNOSIS — J45909 Unspecified asthma, uncomplicated: Secondary | ICD-10-CM | POA: Diagnosis not present

## 2016-09-03 DIAGNOSIS — R55 Syncope and collapse: Secondary | ICD-10-CM | POA: Diagnosis not present

## 2016-09-03 DIAGNOSIS — Z9641 Presence of insulin pump (external) (internal): Secondary | ICD-10-CM | POA: Diagnosis not present

## 2016-09-03 DIAGNOSIS — R0602 Shortness of breath: Secondary | ICD-10-CM | POA: Diagnosis not present

## 2016-09-03 DIAGNOSIS — R079 Chest pain, unspecified: Secondary | ICD-10-CM | POA: Diagnosis not present

## 2016-09-03 DIAGNOSIS — F17210 Nicotine dependence, cigarettes, uncomplicated: Secondary | ICD-10-CM | POA: Diagnosis not present

## 2016-09-08 ENCOUNTER — Telehealth: Payer: Self-pay | Admitting: Internal Medicine

## 2016-09-08 DIAGNOSIS — E109 Type 1 diabetes mellitus without complications: Secondary | ICD-10-CM

## 2016-09-08 NOTE — Telephone Encounter (Signed)
Labs ordered, pt aware. Nothing further needed. 

## 2016-09-08 NOTE — Addendum Note (Signed)
Addended by: Len Blalock on: 09/08/2016 04:31 PM   Modules accepted: Orders

## 2016-09-08 NOTE — Telephone Encounter (Signed)
Chronic headache- concerned about inflammatory process, sarcoid, hypothyroid Plan:  Order labs-     CBC w diff, sed rate, ACE level, ANA, CRP, CMET, TSH    Dx DM type 1 with complications, headache

## 2016-09-16 ENCOUNTER — Telehealth: Payer: Self-pay | Admitting: Internal Medicine

## 2016-09-16 ENCOUNTER — Other Ambulatory Visit (INDEPENDENT_AMBULATORY_CARE_PROVIDER_SITE_OTHER): Payer: 59

## 2016-09-16 DIAGNOSIS — E109 Type 1 diabetes mellitus without complications: Secondary | ICD-10-CM | POA: Diagnosis not present

## 2016-09-16 LAB — CBC WITH DIFFERENTIAL/PLATELET
BASOS PCT: 0.7 % (ref 0.0–3.0)
Basophils Absolute: 0.1 10*3/uL (ref 0.0–0.1)
EOS PCT: 4.4 % (ref 0.0–5.0)
Eosinophils Absolute: 0.4 10*3/uL (ref 0.0–0.7)
HEMATOCRIT: 43.5 % (ref 36.0–46.0)
HEMOGLOBIN: 15.3 g/dL — AB (ref 12.0–15.0)
LYMPHS PCT: 31.1 % (ref 12.0–46.0)
Lymphs Abs: 2.5 10*3/uL (ref 0.7–4.0)
MCHC: 35.1 g/dL (ref 30.0–36.0)
MCV: 93.2 fl (ref 78.0–100.0)
MONOS PCT: 5.8 % (ref 3.0–12.0)
Monocytes Absolute: 0.5 10*3/uL (ref 0.1–1.0)
Neutro Abs: 4.7 10*3/uL (ref 1.4–7.7)
Neutrophils Relative %: 58 % (ref 43.0–77.0)
Platelets: 270 10*3/uL (ref 150.0–400.0)
RBC: 4.67 Mil/uL (ref 3.87–5.11)
RDW: 12.4 % (ref 11.5–15.5)
WBC: 8.1 10*3/uL (ref 4.0–10.5)

## 2016-09-16 LAB — TSH: TSH: 1.28 u[IU]/mL (ref 0.35–4.50)

## 2016-09-16 LAB — COMPREHENSIVE METABOLIC PANEL
ALK PHOS: 74 U/L (ref 39–117)
ALT: 12 U/L (ref 0–35)
AST: 11 U/L (ref 0–37)
Albumin: 4.1 g/dL (ref 3.5–5.2)
BILIRUBIN TOTAL: 0.4 mg/dL (ref 0.2–1.2)
BUN: 14 mg/dL (ref 6–23)
CALCIUM: 9.1 mg/dL (ref 8.4–10.5)
CO2: 26 meq/L (ref 19–32)
CREATININE: 0.87 mg/dL (ref 0.40–1.20)
Chloride: 103 mEq/L (ref 96–112)
GFR: 78.74 mL/min (ref >60.00)
Glucose, Bld: 324 mg/dL — ABNORMAL HIGH (ref 70–99)
Potassium: 4 mEq/L (ref 3.5–5.1)
Sodium: 138 mEq/L (ref 135–145)
TOTAL PROTEIN: 6.9 g/dL (ref 6.0–8.3)

## 2016-09-16 LAB — C-REACTIVE PROTEIN: CRP: 0.2 mg/dL — ABNORMAL LOW (ref 0.5–20.0)

## 2016-09-16 LAB — SEDIMENTATION RATE: SED RATE: 5 mm/h (ref 0–20)

## 2016-09-16 NOTE — Telephone Encounter (Signed)
Still working on the PA---pt is aware.

## 2016-09-17 ENCOUNTER — Ambulatory Visit (INDEPENDENT_AMBULATORY_CARE_PROVIDER_SITE_OTHER)
Admission: RE | Admit: 2016-09-17 | Discharge: 2016-09-17 | Disposition: A | Discharge status: Home / Self Care | Payer: 59 | Source: Ambulatory Visit | Attending: Internal Medicine | Admitting: Internal Medicine

## 2016-09-17 ENCOUNTER — Other Ambulatory Visit: Payer: Self-pay | Admitting: Internal Medicine

## 2016-09-17 DIAGNOSIS — D869 Sarcoidosis, unspecified: Secondary | ICD-10-CM

## 2016-09-17 LAB — ANTI-NUCLEAR AB-TITER (ANA TITER)

## 2016-09-17 LAB — ANGIOTENSIN CONVERTING ENZYME: ANGIOTENSIN-CONVERTING ENZYME: 98 U/L — AB (ref 9–67)

## 2016-09-17 LAB — ANA: ANA: POSITIVE — AB

## 2016-09-22 ENCOUNTER — Encounter (HOSPITAL_BASED_OUTPATIENT_CLINIC_OR_DEPARTMENT_OTHER): Payer: Self-pay | Admitting: *Deleted

## 2016-09-22 ENCOUNTER — Emergency Department (HOSPITAL_BASED_OUTPATIENT_CLINIC_OR_DEPARTMENT_OTHER)
Admission: EM | Admit: 2016-09-22 | Discharge: 2016-09-22 | Disposition: A | Discharge status: Home / Self Care | Payer: 59 | Attending: Emergency Medicine | Admitting: Emergency Medicine

## 2016-09-22 ENCOUNTER — Emergency Department (HOSPITAL_BASED_OUTPATIENT_CLINIC_OR_DEPARTMENT_OTHER): Payer: 59

## 2016-09-22 DIAGNOSIS — E119 Type 2 diabetes mellitus without complications: Secondary | ICD-10-CM | POA: Diagnosis not present

## 2016-09-22 DIAGNOSIS — K3184 Gastroparesis: Secondary | ICD-10-CM | POA: Diagnosis not present

## 2016-09-22 DIAGNOSIS — J45909 Unspecified asthma, uncomplicated: Secondary | ICD-10-CM | POA: Insufficient documentation

## 2016-09-22 DIAGNOSIS — R112 Nausea with vomiting, unspecified: Secondary | ICD-10-CM

## 2016-09-22 DIAGNOSIS — Z794 Long term (current) use of insulin: Secondary | ICD-10-CM | POA: Insufficient documentation

## 2016-09-22 DIAGNOSIS — R1010 Upper abdominal pain, unspecified: Secondary | ICD-10-CM | POA: Diagnosis not present

## 2016-09-22 DIAGNOSIS — R1084 Generalized abdominal pain: Secondary | ICD-10-CM | POA: Diagnosis not present

## 2016-09-22 DIAGNOSIS — E860 Dehydration: Secondary | ICD-10-CM | POA: Diagnosis not present

## 2016-09-22 DIAGNOSIS — Z79899 Other long term (current) drug therapy: Secondary | ICD-10-CM | POA: Insufficient documentation

## 2016-09-22 DIAGNOSIS — N83202 Unspecified ovarian cyst, left side: Secondary | ICD-10-CM | POA: Insufficient documentation

## 2016-09-22 DIAGNOSIS — F17210 Nicotine dependence, cigarettes, uncomplicated: Secondary | ICD-10-CM | POA: Insufficient documentation

## 2016-09-22 DIAGNOSIS — R1013 Epigastric pain: Secondary | ICD-10-CM | POA: Diagnosis not present

## 2016-09-22 HISTORY — DX: Type 2 diabetes mellitus without complications: E11.9

## 2016-09-22 LAB — CBG MONITORING, ED: Glucose-Capillary: 288 mg/dL — ABNORMAL HIGH (ref 65–99)

## 2016-09-22 LAB — TROPONIN I

## 2016-09-22 LAB — CBC WITH DIFFERENTIAL/PLATELET
Basophils Absolute: 0 10*3/uL (ref 0.0–0.1)
Basophils Relative: 0 %
EOS PCT: 2 %
Eosinophils Absolute: 0.2 10*3/uL (ref 0.0–0.7)
HCT: 47.2 % — ABNORMAL HIGH (ref 36.0–46.0)
Hemoglobin: 16.3 g/dL — ABNORMAL HIGH (ref 12.0–15.0)
LYMPHS ABS: 0.5 10*3/uL — AB (ref 0.7–4.0)
LYMPHS PCT: 5 %
MCH: 32.1 pg (ref 26.0–34.0)
MCHC: 34.5 g/dL (ref 30.0–36.0)
MCV: 92.9 fL (ref 78.0–100.0)
MONO ABS: 0.3 10*3/uL (ref 0.1–1.0)
MONOS PCT: 3 %
Neutro Abs: 8.5 10*3/uL — ABNORMAL HIGH (ref 1.7–7.7)
Neutrophils Relative %: 90 %
PLATELETS: 270 10*3/uL (ref 150–400)
RBC: 5.08 MIL/uL (ref 3.87–5.11)
RDW: 12.4 % (ref 11.5–15.5)
WBC: 9.5 10*3/uL (ref 4.0–10.5)

## 2016-09-22 LAB — URINALYSIS, ROUTINE W REFLEX MICROSCOPIC
Glucose, UA: >=500 mg/dL — AB
HGB URINE DIPSTICK: NEGATIVE
Ketones, ur: 40 mg/dL — AB
Leukocytes, UA: NEGATIVE
Nitrite: NEGATIVE
PH: 5.5 (ref 5.0–8.0)
Protein, ur: 100 mg/dL — AB
SPECIFIC GRAVITY, URINE: 1.044 — AB (ref 1.005–1.030)

## 2016-09-22 LAB — COMPREHENSIVE METABOLIC PANEL WITH GFR
ALT: 16 U/L (ref 14–54)
AST: 20 U/L (ref 15–41)
Albumin: 3.9 g/dL (ref 3.5–5.0)
Alkaline Phosphatase: 70 U/L (ref 38–126)
Anion gap: 11 (ref 5–15)
BUN: 21 mg/dL — ABNORMAL HIGH (ref 6–20)
CO2: 22 mmol/L (ref 22–32)
Calcium: 8.6 mg/dL — ABNORMAL LOW (ref 8.9–10.3)
Chloride: 102 mmol/L (ref 101–111)
Creatinine, Ser: 0.85 mg/dL (ref 0.44–1.00)
GFR calc Af Amer: >60 mL/min
GFR calc non Af Amer: >60 mL/min
Glucose, Bld: 279 mg/dL — ABNORMAL HIGH (ref 65–99)
Potassium: 3.6 mmol/L (ref 3.5–5.1)
Sodium: 135 mmol/L (ref 135–145)
Total Bilirubin: 0.8 mg/dL (ref 0.3–1.2)
Total Protein: 6.9 g/dL (ref 6.5–8.1)

## 2016-09-22 LAB — PREGNANCY, URINE: Preg Test, Ur: NEGATIVE

## 2016-09-22 LAB — URINALYSIS, MICROSCOPIC (REFLEX)

## 2016-09-22 LAB — LIPASE, BLOOD: LIPASE: 10 U/L — AB (ref 11–51)

## 2016-09-22 MED ORDER — MORPHINE SULFATE (PF) 4 MG/ML IV SOLN
4.0000 mg | Freq: Once | INTRAVENOUS | Status: AC
  Administered 2016-09-22: 4 mg via INTRAVENOUS
  Filled 2016-09-22: qty 1

## 2016-09-22 MED ORDER — METOCLOPRAMIDE HCL 5 MG/ML IJ SOLN
10.0000 mg | Freq: Once | INTRAMUSCULAR | Status: AC
  Administered 2016-09-22: 10 mg via INTRAVENOUS
  Filled 2016-09-22: qty 2

## 2016-09-22 MED ORDER — DIPHENHYDRAMINE HCL 50 MG/ML IJ SOLN
25.0000 mg | Freq: Once | INTRAMUSCULAR | Status: AC
  Administered 2016-09-22: 25 mg via INTRAVENOUS
  Filled 2016-09-22: qty 1

## 2016-09-22 MED ORDER — HYDROMORPHONE HCL 1 MG/ML IJ SOLN
1.0000 mg | Freq: Once | INTRAMUSCULAR | Status: AC
  Administered 2016-09-22: 1 mg via INTRAVENOUS
  Filled 2016-09-22: qty 1

## 2016-09-22 MED ORDER — KETOROLAC TROMETHAMINE 60 MG/2ML IM SOLN
15.0000 mg | Freq: Once | INTRAMUSCULAR | Status: AC
  Administered 2016-09-22: 15 mg via INTRAMUSCULAR
  Filled 2016-09-22: qty 2

## 2016-09-22 MED ORDER — SODIUM CHLORIDE 0.9 % IV BOLUS (SEPSIS): 1000.0000 mL | Freq: Once | INTRAVENOUS | Status: DC

## 2016-09-22 MED ORDER — SODIUM CHLORIDE 0.9 % IV BOLUS (SEPSIS)
1000.0000 mL | Freq: Once | INTRAVENOUS | Status: AC
  Administered 2016-09-22: 1000 mL via INTRAVENOUS

## 2016-09-22 MED ORDER — LACTATED RINGERS IV BOLUS (SEPSIS)
1000.0000 mL | Freq: Once | INTRAVENOUS | Status: AC
  Administered 2016-09-22: 1000 mL via INTRAVENOUS

## 2016-09-22 MED ORDER — METOCLOPRAMIDE HCL 10 MG PO TABS: 10.0000 mg | ORAL_TABLET | Freq: Four times a day (QID) | ORAL | 0 refills | Status: DC

## 2016-09-22 MED ORDER — IOPAMIDOL (ISOVUE-300) INJECTION 61%
100.0000 mL | Freq: Once | INTRAVENOUS | Status: AC | PRN
  Administered 2016-09-22: 100 mL via INTRAVENOUS

## 2016-09-22 NOTE — ED Triage Notes (Signed)
Patient states she developed nausea and vomiting today at 0500.  Has vomited x 7 and is associated with diarrhea x 2, and upper abdominal pain.  States she also developed chest pain, tightness and sob.

## 2016-09-22 NOTE — ED Provider Notes (Signed)
[facility] Provider Note   CSN: [id number] Arrival date & time: 09/22/16  1233     History   Chief Complaint Chief Complaint  Patient presents with  . Emesis    HPI KAYAN GARRON is a 36 y.o. female.  HPI 36 yo F with extensive PMHx including HTN, IDDM, DM2 who p/w nausea, vomiting, diffuse abdominal pain. Pt states her sx started this morning as gradual onset of diffuse, aching, gnawing, abdominal pain. She then developed profuse, non-bloody, non-bilious emesis and has been unable to eat or drink. She also intermittently has radiation of pain down both flanks. Pain worse with palpation and movement. Denies any alleviating factors. No recent sick contacts.  Past Medical History:  Diagnosis Date  . Abscess of left axilla - PREVOTELLA BIVIA & Staph Coag Neg 07/12/2012   MODERATE PREVOTELLA BIVIA Note: BETA LACTAMASE NEGATIVE    . Asthma    as a child  . Cancer ([place])    skin tag rt breast  . Cellulitis 06/01/2014   RT INNER THIGH  . Depression    hx  . Diabetes ([place])   . Diabetes mellitus    IDDM, Insulin pump; followed by Dr. [name]  . GERD (gastroesophageal reflux disease)    no current meds.  . Heart murmur   . Hidradenitis 04/2012   bilat. thighs, left groin - open areas on thighs  . Hx MRSA infection   . Hydradenitis   . Migraine   . PCOS (polycystic ovarian syndrome)   . Pneumonia    hx  . Vitamin D insufficiency     Patient Active Problem List   Diagnosis Date Noted  . Abscess 03/17/2016  . Chest discomfort 07/10/2014  . Cellulitis 06/01/2014  . Cellulitis of right thigh 06/01/2014  . Postop check 01/19/2014  . Smoking addiction 08/29/2013  . DKA, type 1 ([place]) 05/09/2013  . Hidradenitis suppurativa 04/21/2012  . Esophageal reflux 11/17/2011  . Seasonal and perennial allergic rhinitis 05/20/2010  . BRONCHITIS, ACUTE 11/29/2007  . HYPONATREMIA 11/14/2007  . ANXIETY STATE, UNSPECIFIED 10/24/2007  . Sinus tachycardia 07/07/2007  .  Type 1 diabetes mellitus ([place]) 04/08/2007  . SMOKER 04/08/2007  . DEPRESSION 04/08/2007  . 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 MIGRAINES, HX OF 04/08/2007    Past Surgical History:  Procedure Laterality Date  . BREAST SURGERY     right lumpectomy  . BUNIONECTOMY     left  . CARDIAC CATHETERIZATION    . CHOLECYSTECTOMY  12/02/2006   lap. chole.  Marland Kitchen DILATION AND EVACUATION  02/14/2007  . ESOPHAGOGASTRODUODENOSCOPY  11/17/2011   Procedure: ESOPHAGOGASTRODUODENOSCOPY (EGD);  Surgeon: [name], MD;  Location: [facility];  Service: Endoscopy;  Laterality: N/A;  . EYE SURGERY     exc. stye left eye  . HYDRADENITIS EXCISION  04/30/2012   Procedure: EXCISION HYDRADENITIS GROIN;  Surgeon: [name], MD;  Location: [facility];  Service: General;  Laterality: Left;  wide excision hidradenitis bilateral thighs and Left groin  . HYDRADENITIS EXCISION Left 01/13/2014   Procedure: WIDE EXCISION HIDRADENITIS LEFT AXILLA;  Surgeon: [name], MD;  Location: [facility];  Service: General;  Laterality: Left;  . IRRIGATION AND DEBRIDEMENT ABSCESS Right 06/02/2014   Procedure: IRRIGATION AND DEBRIDEMENT ABSCESS;  Surgeon: [name], MD;  Location: [facility];  Service: General;  Laterality: Right;  . IRRIGATION AND DEBRIDEMENT ABSCESS Left 09/23/2014   Procedure: IRRIGATION AND DEBRIDEMENT ABSCESS/LEFT THIGH;  Surgeon: [name], MD;  Location: [facility]  OR;  Service: General;  Laterality: Left;  . LEFT HEART CATHETERIZATION WITH CORONARY ANGIOGRAM N/A 07/14/2014   Procedure: LEFT HEART CATHETERIZATION WITH CORONARY ANGIOGRAM;  Surgeon: [name], MD;  Location: [facility] CATH LAB;  Service: Cardiovascular;  Laterality: N/A;  . TONSILLECTOMY    . WISDOM TOOTH EXTRACTION      OB History    Gravida Para Term Preterm AB Living   4       3 1    SAB TAB Ectopic Multiple Live Births   3               Home Medications    Prior to Admission medications   Medication  Sig Start Date End Date Taking? Authorizing Provider  doxycycline (VIBRA-TABS) 100 MG tablet Take 1 tablet (100 mg total) by mouth 2 (two) times daily. 03/19/16  Yes Debbe Odea, MD  fluconazole (DIFLUCAN) 150 MG tablet Take 1 tablet (150 mg total) by mouth daily as needed (for yeast infections). Takes when she takes doxycycline 03/19/16  Yes Debbe Odea, MD  insulin glargine (LANTUS) 100 UNIT/ML injection Inject 50 Units into the skin at bedtime as needed. Reported on 03/21/2016 05/10/13  Yes Charlynne Cousins, MD  Insulin Human (INSULIN PUMP) SOLN Inject into the skin continuous. *Novolog*   Yes Historical Provider, MD  levonorgestrel (MIRENA) 20 MCG/24HR IUD 1 each by Intrauterine route once. Placed 04/2013   Yes Historical Provider, MD  pantoprazole (PROTONIX) 40 MG tablet Take 1 tablet (40 mg total) by mouth daily. 01/08/16  Yes Deneise Lever, MD  spironolactone (ALDACTONE) 100 MG tablet Take 100 mg by mouth daily.   Yes Historical Provider, MD  Vilazodone HCl (VIIBRYD) 40 MG TABS 1 daily 04/03/16  Yes Deneise Lever, MD  Adalimumab (HUMIRA) 40 MG/0.8ML PSKT Inject 40 mg into the skin every Monday. Reported on 03/21/2016    Historical Provider, MD    Family History Family History  Problem Relation Age of Onset  . Hyperlipidemia Sister   . Hypertension Sister   . Hypertension Father     Social History Social History  Substance Use Topics  . Smoking status: Current Every Day Smoker    Packs/day: 0.50    Years: 14.00    Types: Cigarettes  . Smokeless tobacco: Never Used  . Alcohol use No     Allergies   Latex; Levofloxacin; Moxifloxacin; Oxycodone-acetaminophen; Peach [prunus persica]; Potassium-containing compounds; Prednisone; Propoxyphene n-acetaminophen; Rosiglitazone maleate; Xolair [omalizumab]; Adhesive [tape]; Morphine and related; Prozac [fluoxetine hcl]; Clindamycin/lincomycin; Septra [sulfamethoxazole-trimethoprim]; Cefaclor; Keflex [cephalexin]; Promethazine hcl; and  Sulfadiazine   Review of Systems Review of Systems  Constitutional: Positive for fatigue. Negative for chills and fever.  HENT: Negative for congestion, rhinorrhea and sore throat.   Eyes: Negative for visual disturbance.  Respiratory: Negative for cough, shortness of breath and wheezing.   Cardiovascular: Positive for chest pain. Negative for leg swelling.  Gastrointestinal: Positive for abdominal pain, nausea and vomiting. Negative for abdominal distention and diarrhea.  Genitourinary: Negative for dysuria, flank pain, vaginal bleeding and vaginal discharge.  Musculoskeletal: Negative for neck pain.  Skin: Negative for rash.  Allergic/Immunologic: Negative for immunocompromised state.  Neurological: Negative for syncope and headaches.  Hematological: Does not bruise/bleed easily.  All other systems reviewed and are negative.    Physical Exam Updated Vital Signs BP 116/84   Pulse 114   Temp 98.7 F (37.1 C) (Oral)   Resp 23   Ht 5\' 5"  (1.651 m)   Wt 230 lb (104.3 kg)  LMP  (Exact Date)   SpO2 96%   BMI 38.27 kg/m   Physical Exam  Constitutional: She is oriented to person, place, and time. She appears well-developed and well-nourished. No distress.  HENT:  Head: Normocephalic and atraumatic.  Mouth/Throat: No oropharyngeal exudate.  Dry MM  Eyes: Conjunctivae are normal. Pupils are equal, round, and reactive to light.  Neck: Neck supple.  Cardiovascular: Normal rate, regular rhythm and normal heart sounds.  Exam reveals no friction rub.   No murmur heard. Pulmonary/Chest: Effort normal and breath sounds normal. No respiratory distress. She has no wheezes. She has no rales.  Abdominal: Soft. Bowel sounds are normal. She exhibits no distension. There is tenderness (diffuse, generalized, worst in epigastric area). There is no rebound and no guarding.  Musculoskeletal: She exhibits no edema.  Neurological: She is alert and oriented to person, place, and time. She exhibits  normal muscle tone.  Skin: Skin is warm. Capillary refill takes less than 2 seconds.  Psychiatric: She has a normal mood and affect.  Nursing note and vitals reviewed.    ED Treatments / Results  Labs (all labs ordered are listed, but only abnormal results are displayed) Labs Reviewed  URINALYSIS, ROUTINE W REFLEX MICROSCOPIC - Abnormal; Notable for the following:       Result Value   Color, Urine AMBER (*)    APPearance CLOUDY (*)    Specific Gravity, Urine 1.044 (*)    Glucose, UA >=500 (*)    Bilirubin Urine MODERATE (*)    Ketones, ur 40 (*)    Protein, ur 100 (*)    All other components within normal limits  CBC WITH DIFFERENTIAL/PLATELET - Abnormal; Notable for the following:    Hemoglobin 16.3 (*)    HCT 47.2 (*)    Neutro Abs 8.5 (*)    Lymphs Abs 0.5 (*)    All other components within normal limits  COMPREHENSIVE METABOLIC PANEL - Abnormal; Notable for the following:    Glucose, Bld 279 (*)    BUN 21 (*)    Calcium 8.6 (*)    All other components within normal limits  LIPASE, BLOOD - Abnormal; Notable for the following:    Lipase 10 (*)    All other components within normal limits  URINALYSIS, MICROSCOPIC (REFLEX) - Abnormal; Notable for the following:    Bacteria, UA RARE (*)    Squamous Epithelial / LPF 6-30 (*)    All other components within normal limits  CBG MONITORING, ED - Abnormal; Notable for the following:    Glucose-Capillary 288 (*)    All other components within normal limits  PREGNANCY, URINE  TROPONIN I    EKG  EKG Interpretation  Date/Time:  Monday September 22 2016 13:06:01 EST Ventricular Rate:  128 PR Interval:    QRS Duration: 78 QT Interval:  274 QTC Calculation: 400 R Axis:   73 Text Interpretation:  Sinus tachycardia Borderline T abnormalities, diffuse leads Since last EKG, rate has increased Otherwise no significant change Confirmed by Kayleanna Lorman MD, Rollan Roger [phone]) on 09/22/2016 2:35:23 PM       Radiology Ct Abdomen Pelvis W  Contrast  Result Date: 09/22/2016 CLINICAL DATA:  Acute upper abdominal pain. EXAM: CT ABDOMEN AND PELVIS WITH CONTRAST TECHNIQUE: Multidetector CT imaging of the abdomen and pelvis was performed using the standard protocol following bolus administration of intravenous contrast. CONTRAST:  165mL ISOVUE-300 IOPAMIDOL (ISOVUE-300) INJECTION 61% COMPARISON:  None. FINDINGS: Lower chest: No acute abnormality. Hepatobiliary: No focal liver abnormality is  seen. Status post cholecystectomy. No biliary dilatation. Pancreas: Unremarkable. No pancreatic ductal dilatation or surrounding inflammatory changes. Spleen: Normal in size without focal abnormality. Adrenals/Urinary Tract: Adrenal glands are unremarkable. Kidneys are normal, without renal calculi, focal lesion, or hydronephrosis. Bladder is unremarkable. Stomach/Bowel: Stomach is within normal limits. Appendix appears normal. No evidence of bowel wall thickening, distention, or inflammatory changes. Vascular/Lymphatic: No significant vascular findings are present. No enlarged abdominal or pelvic lymph nodes. Reproductive: Intrauterine device is noted. Right ovary appears normal. 4.2 cm left ovarian cyst is noted. Other: No abdominal wall hernia or abnormality. No abdominopelvic ascites. Musculoskeletal: No acute or significant osseous findings. IMPRESSION: 4.2 cm left ovarian cyst. Pelvic ultrasound is recommended for further evaluation. No other abnormality seen in the abdomen or pelvis. Electronically Signed   By: Marijo Conception, M.D.   On: 09/22/2016 15:21    Procedures Procedures (including critical care time)  Medications Ordered in ED Medications  lactated ringers bolus 1,000 mL (1,000 mLs Intravenous New Bag/Given 09/22/16 1541)  sodium chloride 0.9 % bolus 1,000 mL (0 mLs Intravenous Stopped 09/22/16 1536)  HYDROmorphone (DILAUDID) injection 1 mg (1 mg Intravenous Given 09/22/16 1412)  metoCLOPramide (REGLAN) injection 10 mg (10 mg Intravenous Given  09/22/16 1411)  diphenhydrAMINE (BENADRYL) injection 25 mg (25 mg Intravenous Given 09/22/16 1409)  sodium chloride 0.9 % bolus 1,000 mL (0 mLs Intravenous Stopped 09/22/16 1440)  iopamidol (ISOVUE-300) 61 % injection 100 mL (100 mLs Intravenous Contrast Given 09/22/16 1457)  ketorolac (TORADOL) injection 15 mg (15 mg Intramuscular Given 09/22/16 1541)  morphine 4 MG/ML injection 4 mg (4 mg Intravenous Given 09/22/16 1541)     Initial Impression / Assessment and Plan / ED Course  I have reviewed the triage vital signs and the nursing notes.  Pertinent labs & imaging results that were available during my care of the patient were reviewed by me and considered in my medical decision making (see chart for details).  Clinical Course     36 yo F with PMHx of HTN, DM2, morbid obesity here with diffuse abdominal pain. On arrival, pt dehydrated clinically with diffuse abdominal TTP. Lab work is as above, with normal CBC and renal function. She does have mild hyperglycemia but normal CO2, normal AG without signs of DKA. UA with mild ketonuria, likely 2/2 dehydration, with no signs of infection. Suspect viral GI illness versus gastritis versus gastroparesis. However, given tachycardia, degree of pain, CT pending.  CT shows left 4 cm ovarian cyst, and pt does have mild pain there - will check U/S to eval torsion. O/w, symptoms improving with IVF and pain control. HR now low 100s. Plan to f/u TVUS - if neg and pt VS remain stable and pt tolerating PO, suspect she can be managed as outpt.   Final Clinical Impressions(s) / ED Diagnoses   Final diagnoses:  Left ovarian cyst  Dehydration  Nausea and vomiting, intractability of vomiting not specified, unspecified vomiting type  Abdominal pain, generalized    New Prescriptions New Prescriptions   No medications on file     Duffy Bruce, MD 09/22/16 1549

## 2016-09-22 NOTE — ED Provider Notes (Signed)
Please see previous physicians note regarding patient's presenting history and physical, initial ED course, and associated medical decision making.  36 year old female with IDDM-2, HTN, gastroparesis p/w nausea, vomiting and abdominal pain. Dry and tachycardic in ED. No evidence of DKA. Receiving IVF, antiemetics. CT scan showing ovarian cyst, and she is pending US pelvis for evaluation.   Ultrasound visualized. Shows hemorrhagic ovarian cyst, without evidence of torsion. She feels improved, able to tolerate by mouth intake here in the emergency department. Heart rate 94-98 at bedside. Abdomen on repeat exam is benign. We'll discharge home with Reglan. Strict return and follow-up instructions reviewed. She expressed understanding of all discharge instructions and felt comfortable with the plan of care.     Forde Dandy, MD 09/22/16 1754

## 2016-09-22 NOTE — Discharge Instructions (Signed)
Return without fail for worsening symptoms, including intractable vomiting, confusion, concern for dehydration, escalating pain, or any other symptoms concerning to you.

## 2016-09-25 ENCOUNTER — Emergency Department (HOSPITAL_COMMUNITY)
Admission: EM | Admit: 2016-09-25 | Discharge: 2016-09-25 | Disposition: A | Discharge status: Home / Self Care | Payer: 59 | Attending: Emergency Medicine | Admitting: Emergency Medicine

## 2016-09-25 ENCOUNTER — Encounter (HOSPITAL_COMMUNITY): Payer: Self-pay | Admitting: Emergency Medicine

## 2016-09-25 DIAGNOSIS — J45909 Unspecified asthma, uncomplicated: Secondary | ICD-10-CM | POA: Insufficient documentation

## 2016-09-25 DIAGNOSIS — R1012 Left upper quadrant pain: Secondary | ICD-10-CM | POA: Insufficient documentation

## 2016-09-25 DIAGNOSIS — E119 Type 2 diabetes mellitus without complications: Secondary | ICD-10-CM

## 2016-09-25 DIAGNOSIS — Z794 Long term (current) use of insulin: Secondary | ICD-10-CM | POA: Diagnosis not present

## 2016-09-25 DIAGNOSIS — Z91040 Latex allergy status: Secondary | ICD-10-CM | POA: Insufficient documentation

## 2016-09-25 DIAGNOSIS — I10 Essential (primary) hypertension: Secondary | ICD-10-CM | POA: Diagnosis not present

## 2016-09-25 DIAGNOSIS — IMO0001 Reserved for inherently not codable concepts without codable children: Secondary | ICD-10-CM

## 2016-09-25 DIAGNOSIS — R112 Nausea with vomiting, unspecified: Secondary | ICD-10-CM | POA: Insufficient documentation

## 2016-09-25 DIAGNOSIS — F17210 Nicotine dependence, cigarettes, uncomplicated: Secondary | ICD-10-CM | POA: Insufficient documentation

## 2016-09-25 DIAGNOSIS — E109 Type 1 diabetes mellitus without complications: Secondary | ICD-10-CM | POA: Insufficient documentation

## 2016-09-25 LAB — URINALYSIS, ROUTINE W REFLEX MICROSCOPIC
BACTERIA UA: NONE SEEN
BILIRUBIN URINE: NEGATIVE
Glucose, UA: >=500 mg/dL — AB
Hgb urine dipstick: NEGATIVE
KETONES UR: 20 mg/dL — AB
Nitrite: NEGATIVE
Protein, ur: 30 mg/dL — AB
Specific Gravity, Urine: 1.036 — ABNORMAL HIGH (ref 1.005–1.030)
pH: 5 (ref 5.0–8.0)

## 2016-09-25 LAB — CBC
HEMATOCRIT: 39.7 % (ref 36.0–46.0)
Hemoglobin: 13.6 g/dL (ref 12.0–15.0)
MCH: 31.7 pg (ref 26.0–34.0)
MCHC: 34.3 g/dL (ref 30.0–36.0)
MCV: 92.5 fL (ref 78.0–100.0)
Platelets: 253 10*3/uL (ref 150–400)
RBC: 4.29 MIL/uL (ref 3.87–5.11)
RDW: 12.6 % (ref 11.5–15.5)
WBC: 5.5 10*3/uL (ref 4.0–10.5)

## 2016-09-25 LAB — COMPREHENSIVE METABOLIC PANEL
ALK PHOS: 57 U/L (ref 38–126)
ALT: 15 U/L (ref 14–54)
AST: 16 U/L (ref 15–41)
Albumin: 3.7 g/dL (ref 3.5–5.0)
Anion gap: 4 — ABNORMAL LOW (ref 5–15)
BILIRUBIN TOTAL: 0.2 mg/dL — AB (ref 0.3–1.2)
BUN: 12 mg/dL (ref 6–20)
CO2: 22 mmol/L (ref 22–32)
CREATININE: 0.61 mg/dL (ref 0.44–1.00)
Calcium: 8.5 mg/dL — ABNORMAL LOW (ref 8.9–10.3)
Chloride: 111 mmol/L (ref 101–111)
GFR calc Af Amer: >60 mL/min (ref >60)
Glucose, Bld: 210 mg/dL — ABNORMAL HIGH (ref 65–99)
Potassium: 3.5 mmol/L (ref 3.5–5.1)
Sodium: 137 mmol/L (ref 135–145)
TOTAL PROTEIN: 6.1 g/dL — AB (ref 6.5–8.1)

## 2016-09-25 LAB — CBG MONITORING, ED: GLUCOSE-CAPILLARY: 194 mg/dL — AB (ref 65–99)

## 2016-09-25 LAB — I-STAT BETA HCG BLOOD, ED (MC, WL, AP ONLY)

## 2016-09-25 LAB — LIPASE, BLOOD: Lipase: 18 U/L (ref 11–51)

## 2016-09-25 MED ORDER — FAMOTIDINE IN NACL 20-0.9 MG/50ML-% IV SOLN
20.0000 mg | Freq: Once | INTRAVENOUS | Status: AC
  Administered 2016-09-25: 20 mg via INTRAVENOUS
  Filled 2016-09-25: qty 50

## 2016-09-25 MED ORDER — SODIUM CHLORIDE 0.9 % IV SOLN
1000.0000 mL | INTRAVENOUS | Status: DC
  Administered 2016-09-25: 1000 mL via INTRAVENOUS

## 2016-09-25 MED ORDER — HYDROCODONE-ACETAMINOPHEN 5-325 MG PO TABS: 1.0000 | ORAL_TABLET | ORAL | 0 refills | Status: DC | PRN

## 2016-09-25 MED ORDER — ONDANSETRON 4 MG PO TBDP: 4.0000 mg | ORAL_TABLET | ORAL | 0 refills | Status: DC | PRN

## 2016-09-25 MED ORDER — DIPHENHYDRAMINE HCL 25 MG PO CAPS: 25.0000 mg | ORAL_CAPSULE | Freq: Four times a day (QID) | ORAL | 0 refills | Status: DC | PRN

## 2016-09-25 MED ORDER — DIPHENHYDRAMINE HCL 50 MG/ML IJ SOLN
25.0000 mg | Freq: Once | INTRAMUSCULAR | Status: AC
  Administered 2016-09-25: 25 mg via INTRAVENOUS
  Filled 2016-09-25: qty 1

## 2016-09-25 MED ORDER — SODIUM CHLORIDE 0.9 % IV SOLN
1000.0000 mL | Freq: Once | INTRAVENOUS | Status: AC
  Administered 2016-09-25: 1000 mL via INTRAVENOUS

## 2016-09-25 MED ORDER — METOCLOPRAMIDE HCL 5 MG/ML IJ SOLN
10.0000 mg | Freq: Once | INTRAMUSCULAR | Status: AC
  Administered 2016-09-25: 10 mg via INTRAVENOUS
  Filled 2016-09-25: qty 2

## 2016-09-25 NOTE — ED Provider Notes (Signed)
[facility] DEPT Provider Note   CSN: [id number] Arrival date & time: 09/25/16  [id number]     History   Chief Complaint Chief Complaint  Patient presents with  . Abdominal Pain  . Emesis  . Near Syncope    HPI [name] is a 36 y.o. female.  HPI Patient reports her vomiting has continued since her last ED visit. She reports she continues to vomit if she tries to eat or drink something. She did try to get back to work today but she was unable to due to continued emesis. Or she continues to have upper abdominal pain. It's more to the left in the center. The patient has prior history of cholecystectomy. Patient denies diarrhea or constipation. She was diagnosed with a hemorrhagic left ovarian cyst at her last visit. She reports she still continues to be a little tender in the left lower abdomen but most of her pain is in the left upper. She has not developed any fever. Past Medical History:  Diagnosis Date  . Abscess of left axilla - PREVOTELLA BIVIA & Staph Coag Neg 07/12/2012   MODERATE PREVOTELLA BIVIA Note: BETA LACTAMASE NEGATIVE    . Asthma    as a child  . Cancer ([place])    skin tag rt breast  . Cellulitis 06/01/2014   RT INNER THIGH  . Depression    hx  . Diabetes ([place])   . Diabetes mellitus    IDDM, Insulin pump; followed by Dr. [name]  . GERD (gastroesophageal reflux disease)    no current meds.  . Heart murmur   . Hidradenitis 04/2012   bilat. thighs, left groin - open areas on thighs  . Hx MRSA infection   . Hydradenitis   . Migraine   . PCOS (polycystic ovarian syndrome)   . Pneumonia    hx  . Vitamin D insufficiency     Patient Active Problem List   Diagnosis Date Noted  . Abscess 03/17/2016  . Chest discomfort 07/10/2014  . Cellulitis 06/01/2014  . Cellulitis of right thigh 06/01/2014  . Postop check 01/19/2014  . Smoking addiction 08/29/2013  . DKA, type 1 ([place]) 05/09/2013  . Hidradenitis suppurativa 04/21/2012  . Esophageal reflux  11/17/2011  . Seasonal and perennial allergic rhinitis 05/20/2010  . BRONCHITIS, ACUTE 11/29/2007  . HYPONATREMIA 11/14/2007  . ANXIETY STATE, UNSPECIFIED 10/24/2007  . Sinus tachycardia 07/07/2007  . Type 1 diabetes mellitus ([place]) 04/08/2007  . SMOKER 04/08/2007  . DEPRESSION 04/08/2007  . 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 MIGRAINES, HX OF 04/08/2007    Past Surgical History:  Procedure Laterality Date  . BREAST SURGERY     right lumpectomy  . BUNIONECTOMY     left  . CARDIAC CATHETERIZATION    . CHOLECYSTECTOMY  12/02/2006   lap. chole.  Marland Kitchen DILATION AND EVACUATION  02/14/2007  . ESOPHAGOGASTRODUODENOSCOPY  11/17/2011   Procedure: ESOPHAGOGASTRODUODENOSCOPY (EGD);  Surgeon: [name], MD;  Location: [facility];  Service: Endoscopy;  Laterality: N/A;  . EYE SURGERY     exc. stye left eye  . HYDRADENITIS EXCISION  04/30/2012   Procedure: EXCISION HYDRADENITIS GROIN;  Surgeon: [name], MD;  Location: [facility];  Service: General;  Laterality: Left;  wide excision hidradenitis bilateral thighs and Left groin  . HYDRADENITIS EXCISION Left 01/13/2014   Procedure: WIDE EXCISION HIDRADENITIS LEFT AXILLA;  Surgeon: [name], MD;  Location: [facility];  Service: General;  Laterality: Left;  . IRRIGATION AND DEBRIDEMENT  ABSCESS Right 06/02/2014   Procedure: IRRIGATION AND DEBRIDEMENT ABSCESS;  Surgeon: [name], MD;  Location: [facility];  Service: General;  Laterality: Right;  . IRRIGATION AND DEBRIDEMENT ABSCESS Left 09/23/2014   Procedure: IRRIGATION AND DEBRIDEMENT ABSCESS/LEFT THIGH;  Surgeon: [name], MD;  Location: [facility];  Service: General;  Laterality: Left;  . LEFT HEART CATHETERIZATION WITH CORONARY ANGIOGRAM N/A 07/14/2014   Procedure: LEFT HEART CATHETERIZATION WITH CORONARY ANGIOGRAM;  Surgeon: [name], MD;  Location: [facility] CATH LAB;  Service: Cardiovascular;  Laterality: N/A;  . TONSILLECTOMY    . WISDOM TOOTH EXTRACTION       OB History    Gravida Para Term Preterm AB Living   4       3 1    SAB TAB Ectopic Multiple Live Births   3               Home Medications    Prior to Admission medications   Medication Sig Start Date End Date Taking? Authorizing Provider  doxycycline (VIBRA-TABS) 100 MG tablet Take 1 tablet (100 mg total) by mouth 2 (two) times daily. Patient taking differently: Take 100 mg by mouth 2 (two) times daily as needed (flare up of hidradenitis).  03/19/16  Yes Debbe Odea, MD  fluconazole (DIFLUCAN) 150 MG tablet Take 1 tablet (150 mg total) by mouth daily as needed (for yeast infections). Takes when she takes doxycycline 03/19/16  Yes Debbe Odea, MD  insulin glargine (LANTUS) 100 UNIT/ML injection Inject 50 Units into the skin at bedtime as needed (blod sugar). Reported on 03/21/2016 05/10/13  Yes Charlynne Cousins, MD  Insulin Human (INSULIN PUMP) SOLN Inject into the skin continuous. *Novolog*   Yes Historical Provider, MD  levonorgestrel (MIRENA) 20 MCG/24HR IUD 1 each by Intrauterine route once. Placed 04/2013   Yes Historical Provider, MD  metoCLOPramide (REGLAN) 10 MG tablet Take 1 tablet (10 mg total) by mouth every 6 (six) hours. 09/22/16  Yes Forde Dandy, MD  pantoprazole (PROTONIX) 40 MG tablet Take 1 tablet (40 mg total) by mouth daily. 01/08/16  Yes Deneise Lever, MD  spironolactone (ALDACTONE) 100 MG tablet Take 100 mg by mouth daily.   Yes Historical Provider, MD  Vilazodone HCl (VIIBRYD) 40 MG TABS 1 daily Patient taking differently: Take 40 mg by mouth daily.  04/03/16  Yes Deneise Lever, MD  diphenhydrAMINE (BENADRYL) 25 mg capsule Take 1 capsule (25 mg total) by mouth every 6 (six) hours as needed. Take with Reglan for nausea control 09/25/16   Charlesetta Shanks, MD  HYDROcodone-acetaminophen (NORCO/VICODIN) 5-325 MG tablet Take 1-2 tablets by mouth every 4 (four) hours as needed for moderate pain or severe pain. 09/25/16   Charlesetta Shanks, MD  ondansetron (ZOFRAN ODT) 4 MG  disintegrating tablet Take 1 tablet (4 mg total) by mouth every 4 (four) hours as needed for nausea or vomiting. 09/25/16   Charlesetta Shanks, MD    Family History Family History  Problem Relation Age of Onset  . Hyperlipidemia Sister   . Hypertension Sister   . Hypertension Father     Social History Social History  Substance Use Topics  . Smoking status: Current Every Day Smoker    Packs/day: 0.50    Years: 14.00    Types: Cigarettes  . Smokeless tobacco: Never Used  . Alcohol use No     Allergies   Latex; Levofloxacin; Moxifloxacin; Oxycodone-acetaminophen; Peach [prunus persica]; Potassium-containing compounds; Prednisone; Propoxyphene n-acetaminophen; Rosiglitazone maleate; Xolair [omalizumab]; Adhesive [tape]; Morphine  and related; Prozac [fluoxetine hcl]; Citrullus vulgaris; Clindamycin/lincomycin; Humira [adalimumab]; Septra [sulfamethoxazole-trimethoprim]; Cefaclor; Keflex [cephalexin]; Promethazine hcl; and Sulfadiazine   Review of Systems Review of Systems 10 Systems reviewed and are negative for acute change except as noted in the HPI.  Physical Exam Updated Vital Signs BP 117/82   Pulse 70   Temp 97.7 F (36.5 C) (Oral)   Resp 17   LMP  (Exact Date)   SpO2 100%   Physical Exam  Constitutional: She is oriented to person, place, and time. She appears well-developed and well-nourished. No distress.  HENT:  Head: Normocephalic and atraumatic.  Eyes: Conjunctivae and EOM are normal.  Neck: Neck supple.  Cardiovascular: Normal rate and regular rhythm.   No murmur heard. Pulmonary/Chest: Effort normal and breath sounds normal. No respiratory distress.  Abdominal: Soft. Bowel sounds are normal. There is tenderness.  Moderate epigastric and left upper quadrant tenderness. No guarding. Lower abdomen soft with very mild tenderness.  Musculoskeletal: Normal range of motion. She exhibits no edema.  Neurological: She is alert and oriented to person, place, and time. She  exhibits normal muscle tone. Coordination normal.  Skin: Skin is warm and dry.  Psychiatric: She has a normal mood and affect.  Nursing note and vitals reviewed.    ED Treatments / Results  Labs (all labs ordered are listed, but only abnormal results are displayed) Labs Reviewed  COMPREHENSIVE METABOLIC PANEL - Abnormal; Notable for the following:       Result Value   Glucose, Bld 210 (*)    Calcium 8.5 (*)    Total Protein 6.1 (*)    Total Bilirubin 0.2 (*)    Anion gap 4 (*)    All other components within normal limits  URINALYSIS, ROUTINE W REFLEX MICROSCOPIC - Abnormal; Notable for the following:    APPearance HAZY (*)    Specific Gravity, Urine 1.036 (*)    Glucose, UA >=500 (*)    Ketones, ur 20 (*)    Protein, ur 30 (*)    Leukocytes, UA SMALL (*)    Squamous Epithelial / LPF 6-30 (*)    All other components within normal limits  CBG MONITORING, ED - Abnormal; Notable for the following:    Glucose-Capillary 194 (*)    All other components within normal limits  LIPASE, BLOOD  CBC  I-STAT BETA HCG BLOOD, ED ([facility], [facility], [facility] ONLY)    EKG  EKG Interpretation  Date/Time:  Thursday September 25 2016 09:51:13 EST Ventricular Rate:  84 PR Interval:    QRS Duration: 90 QT Interval:  350 QTC Calculation: 414 R Axis:   72 Text Interpretation:  Sinus rhythm Borderline T wave abnormalities agree. no sig change from old Confirmed by Johnney Killian, MD, Jeannie Done [phone]) on 09/25/2016 2:19:52 PM       Radiology No results found.  Procedures Procedures (including critical care time)  Medications Ordered in ED Medications  0.9 %  sodium chloride infusion (1,000 mLs Intravenous New Bag/Given 09/25/16 1131)    Followed by  0.9 %  sodium chloride infusion (1,000 mLs Intravenous New Bag/Given 09/25/16 1132)  famotidine (PEPCID) IVPB 20 mg premix (20 mg Intravenous New Bag/Given 09/25/16 1131)  metoCLOPramide (REGLAN) injection 10 mg (10 mg Intravenous Given 09/25/16 1132)  diphenhydrAMINE  (BENADRYL) injection 25 mg (25 mg Intravenous Given 09/25/16 1131)     Initial Impression / Assessment and Plan / ED Course  I have reviewed the triage vital signs and the nursing notes.  Pertinent labs & imaging  results that were available during my care of the patient were reviewed by me and considered in my medical decision making (see chart for details).  Clinical Course     Final Clinical Impressions(s) / ED Diagnoses   Final diagnoses:  Left upper quadrant pain  Non-intractable vomiting with nausea, unspecified vomiting type  Insulin dependent diabetes mellitus ([place])   Diagnostic evaluation remained stable. The patient is clinically well appearance. She has persistent left upper quadrant and epigastric pain. Patient is persistent nausea with sporadic vomiting. Patient got improved control of nausea with addition of Benadryl and Reglan and Zofran. Differential diagnosis for her pain includes gastritis, peptic ulcer disease, gastroparesis, peritoneal irritation from hemorrhagic ovarian cyst. At this time, I feel the patient is stable for symptomatic treatment. Benadryl and Zofran will be added to patient's Reglan and Protonix for control of GI symptoms. She will take Vicodin as needed for short course for acute pain. Patient is counseled if symptoms do not resolve within the next 2-3 days, she is to contact gastroenterology for recheck and possible endoscopy. New Prescriptions New Prescriptions   DIPHENHYDRAMINE (BENADRYL) 25 MG CAPSULE    Take 1 capsule (25 mg total) by mouth every 6 (six) hours as needed. Take with Reglan for nausea control   HYDROCODONE-ACETAMINOPHEN (NORCO/VICODIN) 5-325 MG TABLET    Take 1-2 tablets by mouth every 4 (four) hours as needed for moderate pain or severe pain.   ONDANSETRON (ZOFRAN ODT) 4 MG DISINTEGRATING TABLET    Take 1 tablet (4 mg total) by mouth every 4 (four) hours as needed for nausea or vomiting.     Charlesetta Shanks, MD 09/25/16 [phone]

## 2016-09-25 NOTE — ED Triage Notes (Addendum)
Pt reports ongoing upper abd pain and emesis since 1/1. Was seen at [facility] for same. Was told to return is symptoms getting worse. Also reports near-syncopal episode at work.

## 2016-10-29 ENCOUNTER — Telehealth: Payer: Self-pay | Admitting: Pulmonary Disease

## 2016-10-29 NOTE — Telephone Encounter (Signed)
Confirmed by Ria Comment that the fax went through, the papers are placed in my scan folder to be scanned.

## 2016-10-29 NOTE — Telephone Encounter (Signed)
Form has been completed and signed by AD. They have been faxed back. Originals have been left with Patrice in case the first fax does not go through. Nothing further was needed at this time.

## 2016-10-29 NOTE — Telephone Encounter (Signed)
Forms were located and given to [facility] to have AD fill out. Message will be routed to [facility] for follow up.

## 2016-11-05 ENCOUNTER — Telehealth: Payer: Self-pay | Admitting: Internal Medicine

## 2016-11-05 MED ORDER — VILAZODONE HCL 40 MG PO TABS: 40.0000 mg | ORAL_TABLET | Freq: Every day | ORAL | 6 refills | Status: DC

## 2016-11-05 MED ORDER — ALPRAZOLAM 0.25 MG PO TABS: ORAL_TABLET | ORAL | 2 refills | Status: DC

## 2016-11-05 NOTE — Telephone Encounter (Signed)
Refill sent to the pharmacy 

## 2016-11-05 NOTE — Telephone Encounter (Signed)
Death of father. Significant personal and family stress. Asks med for nerves- discussed in person. P- Xanax 0.25 mg,

## 2016-11-20 DIAGNOSIS — E109 Type 1 diabetes mellitus without complications: Secondary | ICD-10-CM | POA: Diagnosis not present

## 2016-11-21 DIAGNOSIS — E109 Type 1 diabetes mellitus without complications: Secondary | ICD-10-CM | POA: Diagnosis not present

## 2016-11-26 DIAGNOSIS — L732 Hidradenitis suppurativa: Secondary | ICD-10-CM | POA: Diagnosis not present

## 2016-12-03 ENCOUNTER — Other Ambulatory Visit: Payer: Self-pay | Admitting: Internal Medicine

## 2016-12-03 MED ORDER — AZITHROMYCIN 250 MG PO TABS: ORAL_TABLET | ORAL | 3 refills | Status: DC

## 2016-12-08 ENCOUNTER — Telehealth: Payer: Self-pay | Admitting: Internal Medicine

## 2016-12-08 ENCOUNTER — Ambulatory Visit (INDEPENDENT_AMBULATORY_CARE_PROVIDER_SITE_OTHER)
Admission: RE | Admit: 2016-12-08 | Discharge: 2016-12-08 | Disposition: A | Discharge status: Home / Self Care | Payer: 59 | Source: Ambulatory Visit | Attending: Internal Medicine | Admitting: Internal Medicine

## 2016-12-08 ENCOUNTER — Ambulatory Visit (HOSPITAL_COMMUNITY)
Admission: RE | Admit: 2016-12-08 | Discharge: 2016-12-08 | Disposition: A | Discharge status: Home / Self Care | Payer: 59 | Source: Ambulatory Visit | Attending: Internal Medicine | Admitting: Internal Medicine

## 2016-12-08 ENCOUNTER — Other Ambulatory Visit: Payer: Self-pay | Admitting: Internal Medicine

## 2016-12-08 ENCOUNTER — Other Ambulatory Visit (INDEPENDENT_AMBULATORY_CARE_PROVIDER_SITE_OTHER): Payer: 59

## 2016-12-08 DIAGNOSIS — R042 Hemoptysis: Secondary | ICD-10-CM | POA: Diagnosis not present

## 2016-12-08 DIAGNOSIS — M79605 Pain in left leg: Secondary | ICD-10-CM | POA: Diagnosis not present

## 2016-12-08 DIAGNOSIS — M79604 Pain in right leg: Secondary | ICD-10-CM | POA: Insufficient documentation

## 2016-12-08 DIAGNOSIS — R0602 Shortness of breath: Secondary | ICD-10-CM | POA: Diagnosis not present

## 2016-12-08 LAB — CBC WITH DIFFERENTIAL/PLATELET
BASOS PCT: 0.9 % (ref 0.0–3.0)
Basophils Absolute: 0.1 10*3/uL (ref 0.0–0.1)
EOS PCT: 2.9 % (ref 0.0–5.0)
Eosinophils Absolute: 0.2 10*3/uL (ref 0.0–0.7)
HEMATOCRIT: 44.7 % (ref 36.0–46.0)
Hemoglobin: 15.2 g/dL — ABNORMAL HIGH (ref 12.0–15.0)
LYMPHS ABS: 2.8 10*3/uL (ref 0.7–4.0)
LYMPHS PCT: 35.5 % (ref 12.0–46.0)
MCHC: 34 g/dL (ref 30.0–36.0)
MCV: 93.4 fl (ref 78.0–100.0)
MONOS PCT: 5.5 % (ref 3.0–12.0)
Monocytes Absolute: 0.4 10*3/uL (ref 0.1–1.0)
NEUTROS ABS: 4.4 10*3/uL (ref 1.4–7.7)
NEUTROS PCT: 55.2 % (ref 43.0–77.0)
Platelets: 292 10*3/uL (ref 150.0–400.0)
RBC: 4.78 Mil/uL (ref 3.87–5.11)
RDW: 12.9 % (ref 11.5–15.5)
WBC: 8 10*3/uL (ref 4.0–10.5)

## 2016-12-08 LAB — HEPATIC FUNCTION PANEL
ALT: 10 U/L (ref 0–35)
AST: 11 U/L (ref 0–37)
Albumin: 4.1 g/dL (ref 3.5–5.2)
Alkaline Phosphatase: 71 U/L (ref 39–117)
Bilirubin, Direct: 0.1 mg/dL (ref 0.0–0.3)
Total Bilirubin: 0.4 mg/dL (ref 0.2–1.2)
Total Protein: 6.8 g/dL (ref 6.0–8.3)

## 2016-12-08 LAB — BASIC METABOLIC PANEL
BUN: 14 mg/dL (ref 6–23)
CALCIUM: 9.5 mg/dL (ref 8.4–10.5)
CO2: 24 mEq/L (ref 19–32)
CREATININE: 0.91 mg/dL (ref 0.40–1.20)
Chloride: 103 mEq/L (ref 96–112)
GFR: 74.66 mL/min (ref >60.00)
Glucose, Bld: 341 mg/dL — ABNORMAL HIGH (ref 70–99)
Potassium: 4.1 mEq/L (ref 3.5–5.1)
Sodium: 136 mEq/L (ref 135–145)

## 2016-12-08 LAB — PROTIME-INR
INR: 1.1 ratio — AB (ref 0.8–1.0)
PROTHROMBIN TIME: 11.1 s (ref 9.6–13.1)

## 2016-12-08 LAB — D-DIMER, QUANTITATIVE: D-Dimer, Quant: 0.38 mcg/mL FEU (ref <0.50)

## 2016-12-08 MED ORDER — IOPAMIDOL (ISOVUE-370) INJECTION 76%
80.0000 mL | Freq: Once | INTRAVENOUS | Status: AC | PRN
  Administered 2016-12-08: 80 mL via INTRAVENOUS

## 2016-12-08 NOTE — Telephone Encounter (Signed)
New onset today- BRB sputum small ampunts, vague inspiratory soreness/ discomfort mid-anterior chest, left calf pain. Exam- + Homan's left calf, quiet chest w/o tachypnea, rales, wheeze or cough on exam.              RRR, no murmur or rub, no jvd  Ordered- CTa PE protocol, D-dimer, BMET, Hepatic panel, CBC w diff, PT-INR       Dx hemoptysis, Pulm embolism

## 2016-12-08 NOTE — Progress Notes (Signed)
VASCULAR LAB PRELIMINARY  PRELIMINARY  PRELIMINARY  PRELIMINARY  Bilateral lower extremity venous duplex completed.    Preliminary report:  There is no DVT or SVT noted in the bilateral lower extremities.  Called report to Dr. Deon Pilling, [facility], RVT 12/08/2016, 4:07 PM

## 2016-12-10 DIAGNOSIS — R1032 Left lower quadrant pain: Secondary | ICD-10-CM | POA: Diagnosis not present

## 2016-12-10 DIAGNOSIS — R102 Pelvic and perineal pain: Secondary | ICD-10-CM | POA: Diagnosis not present

## 2016-12-11 DIAGNOSIS — R1032 Left lower quadrant pain: Secondary | ICD-10-CM | POA: Diagnosis not present

## 2016-12-11 DIAGNOSIS — N83201 Unspecified ovarian cyst, right side: Secondary | ICD-10-CM | POA: Diagnosis not present

## 2016-12-16 ENCOUNTER — Other Ambulatory Visit: Payer: Self-pay | Admitting: *Deleted

## 2016-12-16 NOTE — Patient Outreach (Addendum)
Deanndra returned e-mail sent to her on 3/26 from this [facility] requesting she arrange Link To Wellness follow up. Appointment scheduled for 12/24/16 at 5:30 pm. Barrington Ellison RN,CCM,CDE [place] Management Coordinator Link To Wellness Office Phone [phone] Office Fax [phone]

## 2016-12-24 ENCOUNTER — Ambulatory Visit: Payer: Self-pay | Admitting: *Deleted

## 2016-12-29 ENCOUNTER — Telehealth: Payer: Self-pay | Admitting: Internal Medicine

## 2016-12-29 MED ORDER — VARENICLINE TARTRATE 0.5 MG X 11 & 1 MG X 42 PO MISC: ORAL | 0 refills | Status: DC

## 2016-12-29 NOTE — Telephone Encounter (Signed)
Direct encounter. Requests re-trial of Chantix. Script sent to pharmacy.

## 2017-01-01 ENCOUNTER — Encounter: Payer: Self-pay | Admitting: *Deleted

## 2017-01-01 ENCOUNTER — Other Ambulatory Visit: Payer: Self-pay | Admitting: *Deleted

## 2017-01-01 VITALS — BP 120/76 | Ht 65.0 in | Wt 231.8 lb

## 2017-01-01 DIAGNOSIS — E109 Type 1 diabetes mellitus without complications: Secondary | ICD-10-CM

## 2017-01-06 LAB — POCT GLYCOSYLATED HEMOGLOBIN (HGB A1C): Hemoglobin A1C: 13.2

## 2017-01-06 NOTE — Patient Outreach (Addendum)
[facility]) Care Management   01/01/2017  [name] 07-15-1981 [id number]  [name] is an 36 y.o. female who presents to the [facility] Management office for routine Link To Wellness follow up for self management assistance with Type I DM and obesity.   Subjective:  [name] says she is now wearing the 670 G closed hybrid Medtronic insulin pump but not the sensor because she says it was causing the pump to "alarm all the time". She also says the pump is still in the manual mode because she has not been able to get to her endocrinologists office in to train with [name] on how to use the pump in the auto mode. She says her blood sugars have been running high because she is voiding and drinking liquids "a lot". She says she last saw [name] in August and is overdue for follow up so she is requesting a POC Hgb A1C. She says has been under a great deal of stress due to her father's death on 2016/11/10, her fiance's Mom's illness which contributed to her living with them for a while and her school age daughter struggling with school due to ADHD. She also says she is working for another job in the [employer] that would not require her to work 10 hour days and decrease her stress levels. [name] also reports she is attempting to stop smoking using Chantix.   Objective:   Review of Systems  Constitutional: Negative.     Physical Exam  Constitutional: She is oriented to person, place, and time. She appears well-developed and well-nourished.  Respiratory: Effort normal.  Neurological: She is alert and oriented to person, place, and time.  Skin: Skin is warm and dry.  Psychiatric: She has a normal mood and affect. Her behavior is normal. Judgment and thought content normal.   Filed Weights   01/01/17 1755  Weight: 231 lb 12.8 oz (105.1 kg)   POC random CBG= 332 POC Hgb A1C= 13.2%  Current Medications:   Current Outpatient  Prescriptions  Medication Sig Dispense Refill  . doxycycline (VIBRA-TABS) 100 MG tablet Take 1 tablet (100 mg total) by mouth 2 (two) times daily. (Patient taking differently: Take 100 mg by mouth 2 (two) times daily as needed (flare up of hidradenitis). ) 10 tablet 5  . finasteride (PROSCAR) 5 MG tablet Take 5 mg by mouth daily.    Marland Kitchen levonorgestrel (MIRENA) 20 MCG/24HR IUD 1 each by Intrauterine route once. Placed 04/2013    . pantoprazole (PROTONIX) 40 MG tablet Take 1 tablet (40 mg total) by mouth daily. 90 tablet 4  . spironolactone (ALDACTONE) 100 MG tablet Take 100 mg by mouth daily.    . varenicline (CHANTIX PAK) 0.5 MG X 11 & 1 MG X 42 tablet Take one 0.5 mg tab by mouth once daily for 3 days, then  one 0.5 mg tab twice daily for 4 days, then  1 mg tab twice daily. 53 tablet 0  . Vilazodone HCl (VIIBRYD) 40 MG TABS Take 1 tablet (40 mg total) by mouth daily. 30 tablet 6  . ALPRAZolam (XANAX) 0.25 MG tablet 1-2 every 6 hours if needed for anxiety 30 tablet 2  . Insulin Human (INSULIN PUMP) SOLN Inject into the skin continuous. *humalog*     No current facility-administered medications for this visit.     Functional Status:   In your present state of health, do you have any difficulty performing  the following activities: 03/17/2016  Hearing? N  Vision? N  Difficulty concentrating or making decisions? N  Walking or climbing stairs? N  Dressing or bathing? N  Doing errands, shopping? N  Some recent data might be hidden    Fall/Depression Screening:    PHQ 2/9 Scores 03/14/2015  PHQ - 2 Score 1    Assessment:   [employer] employee and Link To Wellness member with Type I DM, today's POC  Hgb A1C= 13.2% and morbidly obese with current body mass index= 42.5  Plan:  [facility]N CM Care Plan Problem One        Most Recent Value   Care Plan Problem One  Type I DM with significant decline in glycemic management as evidenced by today's POC Hgb A1C=13.2%, previously 6.8%, on insulin pump,  weight gain of 14 lbs over the last year   Role Documenting the Problem One  Care Management Coordinator   Care Plan for Problem One  Active   THN Long Term Goal (31-90 days)  Improved glycemic management as evidenced by improved Hgb A1C, [name] will make a follow up appointment with her endocrinologist to assist her with improving her glycemic management , evidence of weigh loss or no further weight gain   THN Long Term Goal Start Date  01/01/17   THN Long Term Goal Met Date     Interventions for Problem One Long Term Goal  Reviewed medications and medication adherence, congratulated [name] on her efforts to stop smoking, reviewed insulin pump settings and blood sugars and assessed [name]'s satisfaction with the pump, assessed POC CBG and Hgb A1C and discussed results and targets, encouraged [name] to contact endocrinologist's office to receive further insulin pump training, discussed weight loss strategies, will arrange for link To Wellness follow up in July 2018      RNCM to fax today's office visit note to [name] and [name]. RNCM will meet quarterly and as needed with patient per Link To Wellness program guidelines to assist with Type I DM self-management and assess patient's progress toward mutually set goals.  Barrington Ellison RN,CCM,CDE [place] Management Coordinator Link To Wellness Office Phone [phone] Office Fax [phone]

## 2017-01-21 ENCOUNTER — Other Ambulatory Visit: Payer: Self-pay | Admitting: Internal Medicine

## 2017-01-21 ENCOUNTER — Other Ambulatory Visit: Payer: 59

## 2017-01-21 DIAGNOSIS — J45998 Other asthma: Secondary | ICD-10-CM | POA: Diagnosis not present

## 2017-01-21 LAB — ANGIOTENSIN CONVERTING ENZYME: Angiotensin-Converting Enzyme: 107 U/L — ABNORMAL HIGH (ref 9–67)

## 2017-02-03 ENCOUNTER — Other Ambulatory Visit: Payer: Self-pay | Admitting: Pulmonary Disease

## 2017-02-03 MED ORDER — PANTOPRAZOLE SODIUM 40 MG PO TBEC: 40.0000 mg | DELAYED_RELEASE_TABLET | Freq: Every day | ORAL | 4 refills | Status: DC

## 2017-02-17 DIAGNOSIS — L089 Local infection of the skin and subcutaneous tissue, unspecified: Secondary | ICD-10-CM | POA: Diagnosis not present

## 2017-02-17 DIAGNOSIS — L732 Hidradenitis suppurativa: Secondary | ICD-10-CM | POA: Diagnosis not present

## 2017-02-23 DIAGNOSIS — L732 Hidradenitis suppurativa: Secondary | ICD-10-CM | POA: Diagnosis not present

## 2017-03-05 ENCOUNTER — Other Ambulatory Visit: Payer: 59

## 2017-03-05 ENCOUNTER — Telehealth: Payer: Self-pay | Admitting: Internal Medicine

## 2017-03-05 DIAGNOSIS — L732 Hidradenitis suppurativa: Secondary | ICD-10-CM

## 2017-03-05 NOTE — Telephone Encounter (Signed)
Dr Lamount Cohen at [facility] requesting a Quantiferon GOLD test prior to Remicaide start Okay per CY Order placed for our lab ACE level being checked as well.

## 2017-03-06 LAB — ANGIOTENSIN CONVERTING ENZYME: ANGIOTENSIN-CONVERTING ENZYME: 96 U/L — AB (ref 9–67)

## 2017-03-08 LAB — QUANTIFERON TB GOLD ASSAY (BLOOD)
Interferon Gamma Release Assay: NEGATIVE
MITOGEN-NIL SO: 9.05 [IU]/mL
QUANTIFERON NIL VALUE: 0.13 [IU]/mL
QUANTIFERON TB AG MINUS NIL: 0.01 [IU]/mL

## 2017-03-09 NOTE — Progress Notes (Signed)
Spoke with patient - she is aware of lab results.

## 2017-03-13 ENCOUNTER — Other Ambulatory Visit: Payer: Self-pay | Admitting: Pulmonary Disease

## 2017-03-13 ENCOUNTER — Other Ambulatory Visit (INDEPENDENT_AMBULATORY_CARE_PROVIDER_SITE_OTHER): Payer: 59

## 2017-03-13 DIAGNOSIS — M792 Neuralgia and neuritis, unspecified: Secondary | ICD-10-CM | POA: Diagnosis not present

## 2017-03-13 DIAGNOSIS — E282 Polycystic ovarian syndrome: Secondary | ICD-10-CM | POA: Diagnosis not present

## 2017-03-13 LAB — C-REACTIVE PROTEIN: CRP: 0.2 mg/dL — ABNORMAL LOW (ref 0.5–20.0)

## 2017-03-13 LAB — SEDIMENTATION RATE: Sed Rate: 7 mm/hr (ref 0–20)

## 2017-03-16 LAB — ANTI-NUCLEAR AB-TITER (ANA TITER): ANA Titer 1: 1:40 {titer} — ABNORMAL HIGH

## 2017-03-16 LAB — ANA: ANA: POSITIVE — AB

## 2017-03-16 LAB — CYCLIC CITRUL PEPTIDE ANTIBODY, IGG

## 2017-03-16 LAB — CA 125: CA 125: 11 U/mL (ref <35)

## 2017-03-18 LAB — RHEUMATOID FACTOR

## 2017-04-01 ENCOUNTER — Telehealth: Payer: Self-pay | Admitting: Internal Medicine

## 2017-04-01 MED ORDER — AZITHROMYCIN 250 MG PO TABS: ORAL_TABLET | ORAL | 2 refills | Status: DC

## 2017-04-01 NOTE — Telephone Encounter (Signed)
Sinus infection/ headache Given Zpak, Excedrin Migraine

## 2017-04-09 ENCOUNTER — Other Ambulatory Visit: Payer: Self-pay | Admitting: Internal Medicine

## 2017-04-09 ENCOUNTER — Ambulatory Visit (INDEPENDENT_AMBULATORY_CARE_PROVIDER_SITE_OTHER)
Admission: RE | Admit: 2017-04-09 | Discharge: 2017-04-09 | Disposition: A | Discharge status: Home / Self Care | Payer: 59 | Source: Ambulatory Visit | Attending: Internal Medicine | Admitting: Internal Medicine

## 2017-04-09 DIAGNOSIS — R05 Cough: Secondary | ICD-10-CM | POA: Diagnosis not present

## 2017-04-09 DIAGNOSIS — R079 Chest pain, unspecified: Secondary | ICD-10-CM | POA: Diagnosis not present

## 2017-04-09 DIAGNOSIS — E109 Type 1 diabetes mellitus without complications: Secondary | ICD-10-CM | POA: Diagnosis not present

## 2017-04-09 DIAGNOSIS — R0789 Other chest pain: Secondary | ICD-10-CM | POA: Diagnosis not present

## 2017-04-09 DIAGNOSIS — J209 Acute bronchitis, unspecified: Secondary | ICD-10-CM

## 2017-04-10 ENCOUNTER — Other Ambulatory Visit: Payer: Self-pay | Admitting: Internal Medicine

## 2017-04-10 ENCOUNTER — Other Ambulatory Visit (INDEPENDENT_AMBULATORY_CARE_PROVIDER_SITE_OTHER): Payer: 59

## 2017-04-10 DIAGNOSIS — R509 Fever, unspecified: Secondary | ICD-10-CM

## 2017-04-10 LAB — CBC WITH DIFFERENTIAL/PLATELET
BASOS PCT: 1.1 % (ref 0.0–3.0)
Basophils Absolute: 0.1 10*3/uL (ref 0.0–0.1)
EOS PCT: 3.2 % (ref 0.0–5.0)
Eosinophils Absolute: 0.2 10*3/uL (ref 0.0–0.7)
HEMATOCRIT: 48.4 % — AB (ref 36.0–46.0)
HEMOGLOBIN: 16.2 g/dL — AB (ref 12.0–15.0)
LYMPHS PCT: 35.5 % (ref 12.0–46.0)
Lymphs Abs: 2.2 10*3/uL (ref 0.7–4.0)
MCHC: 33.5 g/dL (ref 30.0–36.0)
MCV: 94.9 fl (ref 78.0–100.0)
MONOS PCT: 5.8 % (ref 3.0–12.0)
Monocytes Absolute: 0.4 10*3/uL (ref 0.1–1.0)
Neutro Abs: 3.5 10*3/uL (ref 1.4–7.7)
Neutrophils Relative %: 54.4 % (ref 43.0–77.0)
Platelets: 303 10*3/uL (ref 150.0–400.0)
RBC: 5.09 Mil/uL (ref 3.87–5.11)
RDW: 12.4 % (ref 11.5–15.5)
WBC: 6.3 10*3/uL (ref 4.0–10.5)

## 2017-04-10 LAB — COMPREHENSIVE METABOLIC PANEL
ALBUMIN: 4.2 g/dL (ref 3.5–5.2)
ALK PHOS: 72 U/L (ref 39–117)
ALT: 12 U/L (ref 0–35)
AST: 10 U/L (ref 0–37)
BUN: 13 mg/dL (ref 6–23)
CALCIUM: 9.5 mg/dL (ref 8.4–10.5)
CHLORIDE: 104 meq/L (ref 96–112)
CO2: 27 mEq/L (ref 19–32)
CREATININE: 0.86 mg/dL (ref 0.40–1.20)
GFR: 79.54 mL/min (ref >60.00)
Glucose, Bld: 238 mg/dL — ABNORMAL HIGH (ref 70–99)
POTASSIUM: 3.7 meq/L (ref 3.5–5.1)
Sodium: 138 mEq/L (ref 135–145)
TOTAL PROTEIN: 7 g/dL (ref 6.0–8.3)
Total Bilirubin: 0.7 mg/dL (ref 0.2–1.2)

## 2017-04-11 LAB — D-DIMER, QUANTITATIVE (NOT AT ARMC): D DIMER QUANT: 0.38 ug{FEU}/mL (ref <0.50)

## 2017-04-13 ENCOUNTER — Telehealth: Payer: Self-pay | Admitting: Internal Medicine

## 2017-04-13 MED ORDER — SPIRONOLACTONE 100 MG PO TABS: 100.0000 mg | ORAL_TABLET | Freq: Every day | ORAL | 3 refills | Status: DC

## 2017-04-13 NOTE — Progress Notes (Signed)
Discussed with patient, she is aware of results per CY.

## 2017-04-13 NOTE — Telephone Encounter (Signed)
Needs refill spironolactone. Can't reach usual prescriber. Plan- Rx spironolactone sent

## 2017-04-15 ENCOUNTER — Ambulatory Visit: Payer: 59 | Admitting: *Deleted

## 2017-04-16 ENCOUNTER — Ambulatory Visit: Payer: Self-pay | Admitting: *Deleted

## 2017-04-24 ENCOUNTER — Other Ambulatory Visit: Payer: Self-pay | Admitting: *Deleted

## 2017-04-24 NOTE — Patient Outreach (Signed)
[facility]) Care Management   04/24/2017  [name] 01-03-1981 [id number]  [name] is an 36 y.o. female who presents to the [place] Management office for routine Link To Wellness follow up for self management assistance with Type I DM and obesity.   Subjective:  [name] says she is now wearing the 670 G closed hybrid Medtronic insulin pump but not the sensor because she says it was causing the pump to "alarm all the time" and she was sensitive to the adhesive on the sensor. She  says the pump remains in manual mode. She says her blood sugars have been running high because she is voiding and drinking liquids "a lot". She says has been under a great deal of stress related to her Mom's recent hospitalization, her fiance had a mini stroke and her job dissatisfaction. She is pleased though that her fiance was approved for disability and he should get his first disability check today as she has been the only wage earner for her household.. She says she continues to look for another job in the [place] that would not require her to work 10 hour days and decrease her stress levels.   Objective:   Review of Systems  Constitutional: Negative.     Physical Exam  Constitutional: She is oriented to person, place, and time. She appears well-developed and well-nourished.  Respiratory: Effort normal.  Neurological: She is alert and oriented to person, place, and time.  Skin: Skin is warm and dry.  Psychiatric: She has a normal mood and affect. Her behavior is normal. Judgment and thought content normal.   Today's Vitals   04/24/17 1241  BP: 104/70  Weight: 244 lb (110.7 kg)  Height: 1.651 m ('5\' 5"' )  PainSc: 0-No pain   . Current Medications:   Current Outpatient Prescriptions  Medication Sig Dispense Refill  . doxycycline (VIBRA-TABS) 100 MG tablet Take 1 tablet (100 mg total) by mouth 2 (two) times daily. (Patient  taking differently: Take 100 mg by mouth 2 (two) times daily as needed (flare up of hidradenitis). ) 10 tablet 5  . finasteride (PROSCAR) 5 MG tablet Take 5 mg by mouth daily.    Marland Kitchen levonorgestrel (MIRENA) 20 MCG/24HR IUD 1 each by Intrauterine route once. Placed 04/2013    . pantoprazole (PROTONIX) 40 MG tablet Take 1 tablet (40 mg total) by mouth daily. 90 tablet 4  . spironolactone (ALDACTONE) 100 MG tablet Take 100 mg by mouth daily.    . varenicline (CHANTIX PAK) 0.5 MG X 11 & 1 MG X 42 tablet Take one 0.5 mg tab by mouth once daily for 3 days, then  one 0.5 mg tab twice daily for 4 days, then  1 mg tab twice daily. 53 tablet 0  . Vilazodone HCl (VIIBRYD) 40 MG TABS Take 1 tablet (40 mg total) by mouth daily. 30 tablet 6  . ALPRAZolam (XANAX) 0.25 MG tablet 1-2 every 6 hours if needed for anxiety 30 tablet 2  . Insulin Human (INSULIN PUMP) SOLN Inject into the skin continuous. *humalog*     No current facility-administered medications for this visit.     Functional Status:   In your present state of health, do you have any difficulty performing the following activities: 04/24/2017  Hearing? N  Vision? N  Difficulty concentrating or making decisions? N  Walking or climbing stairs? N  Dressing or bathing? N  Doing errands, shopping? N  Preparing Food  and eating ? N  Using the Toilet? N  In the past six months, have you accidently leaked urine? N  Do you have problems with loss of bowel control? N  Managing your Medications? N  Managing your Finances? N  Housekeeping or managing your Housekeeping? N  Some recent data might be hidden    Fall/Depression Screening:    PHQ 2/9 Scores 03/14/2015  PHQ - 2 Score 1    Assessment:   [employer] employee and Link To Wellness member with Type I DM wearing the 46 G without a sensor , not meeting treatment target as evidenced by POC   Hgb A1C= 13.2% on 01/06/17 and morbidly obese with current body mass index= 40.7  Plan:  [facility] CM Care Plan  Problem One        Most Recent Value   Care Plan Problem One  Type I DM with significant decline in glycemic management as evidenced by most recent POC Hgb A1C=13.2%, previously 6.8%, on 670 G insulin pump, ongoing weight gain   Role Documenting the Problem One  Care Management Coordinator   Care Plan for Problem One  Active   [facility] Long Term Goal (31-90 days)  Improved glycemic management as evidenced by improved Hgb A1C, [name] will make a follow up appointment with her endocrinologist to assist her with improving her glycemic management , evidence of weight loss or no further weight gain   [facility] Long Term Goal Start Date  04/24/17   [facility] Long Term Goal Met Date     Interventions for Problem One Long Term Goal  Reviewed medications and medication adherence, reviewed insulin pump settings and blood sugars and assessed [name]'s satisfaction with the pump, since she is not wearing the Guardian sensor with the 670 G insulin pump, discussed the option of trying the [employer], using a demonstration kit, showed [name] how the system works and discussed the out of pocket costs and provided her with a brochure,  encouraged [name] to contact endocrinologist's office if she is interested in trying the system, reviewed weight loss strategies, will arrange for link To Wellness follow up in November 2018      RNCM to fax today's office visit note to Dr. Dema Severin and Dr. Hermelinda Dellen. RNCM will meet quarterly and as needed with patient per Link To Wellness program guidelines to assist with Type I DM self-management and assess patient's progress toward mutually set goals.  Barrington Ellison RN,CCM,CDE [place] Management Coordinator Link To Wellness Office Phone [phone] Office Fax [phone]

## 2017-05-27 DIAGNOSIS — Z794 Long term (current) use of insulin: Secondary | ICD-10-CM | POA: Diagnosis not present

## 2017-05-27 DIAGNOSIS — Z881 Allergy status to other antibiotic agents status: Secondary | ICD-10-CM | POA: Diagnosis not present

## 2017-05-27 DIAGNOSIS — E109 Type 1 diabetes mellitus without complications: Secondary | ICD-10-CM | POA: Diagnosis not present

## 2017-05-27 DIAGNOSIS — Z91040 Latex allergy status: Secondary | ICD-10-CM | POA: Diagnosis not present

## 2017-05-27 DIAGNOSIS — E669 Obesity, unspecified: Secondary | ICD-10-CM | POA: Diagnosis not present

## 2017-05-27 DIAGNOSIS — F17210 Nicotine dependence, cigarettes, uncomplicated: Secondary | ICD-10-CM | POA: Diagnosis not present

## 2017-05-27 DIAGNOSIS — L732 Hidradenitis suppurativa: Secondary | ICD-10-CM | POA: Diagnosis not present

## 2017-05-27 DIAGNOSIS — Z6841 Body Mass Index (BMI) 40.0 and over, adult: Secondary | ICD-10-CM | POA: Diagnosis not present

## 2017-05-27 DIAGNOSIS — R197 Diarrhea, unspecified: Secondary | ICD-10-CM | POA: Diagnosis not present

## 2017-06-08 ENCOUNTER — Other Ambulatory Visit: Payer: Self-pay | Admitting: Internal Medicine

## 2017-06-10 ENCOUNTER — Other Ambulatory Visit (INDEPENDENT_AMBULATORY_CARE_PROVIDER_SITE_OTHER): Payer: 59

## 2017-06-10 ENCOUNTER — Other Ambulatory Visit: Payer: Self-pay | Admitting: Pulmonary Disease

## 2017-06-10 DIAGNOSIS — R0789 Other chest pain: Secondary | ICD-10-CM

## 2017-06-10 LAB — BASIC METABOLIC PANEL
BUN: 15 mg/dL (ref 6–23)
CHLORIDE: 100 meq/L (ref 96–112)
CO2: 25 mEq/L (ref 19–32)
Calcium: 9.5 mg/dL (ref 8.4–10.5)
Creatinine, Ser: 0.88 mg/dL (ref 0.40–1.20)
GFR: 77.38 mL/min (ref >60.00)
GLUCOSE: 360 mg/dL — AB (ref 70–99)
POTASSIUM: 4.1 meq/L (ref 3.5–5.1)
Sodium: 132 mEq/L — ABNORMAL LOW (ref 135–145)

## 2017-06-16 DIAGNOSIS — K219 Gastro-esophageal reflux disease without esophagitis: Secondary | ICD-10-CM | POA: Diagnosis not present

## 2017-06-16 DIAGNOSIS — Z79899 Other long term (current) drug therapy: Secondary | ICD-10-CM | POA: Diagnosis not present

## 2017-06-16 DIAGNOSIS — Z791 Long term (current) use of non-steroidal anti-inflammatories (NSAID): Secondary | ICD-10-CM | POA: Diagnosis not present

## 2017-06-16 DIAGNOSIS — K921 Melena: Secondary | ICD-10-CM | POA: Diagnosis not present

## 2017-06-16 DIAGNOSIS — K621 Rectal polyp: Secondary | ICD-10-CM | POA: Diagnosis not present

## 2017-06-16 DIAGNOSIS — R197 Diarrhea, unspecified: Secondary | ICD-10-CM | POA: Diagnosis not present

## 2017-06-16 DIAGNOSIS — Z794 Long term (current) use of insulin: Secondary | ICD-10-CM | POA: Diagnosis not present

## 2017-06-16 DIAGNOSIS — D128 Benign neoplasm of rectum: Secondary | ICD-10-CM | POA: Diagnosis not present

## 2017-06-16 DIAGNOSIS — E1065 Type 1 diabetes mellitus with hyperglycemia: Secondary | ICD-10-CM | POA: Diagnosis not present

## 2017-06-16 DIAGNOSIS — R1084 Generalized abdominal pain: Secondary | ICD-10-CM | POA: Diagnosis not present

## 2017-06-16 DIAGNOSIS — F17210 Nicotine dependence, cigarettes, uncomplicated: Secondary | ICD-10-CM | POA: Diagnosis not present

## 2017-06-16 DIAGNOSIS — I10 Essential (primary) hypertension: Secondary | ICD-10-CM | POA: Diagnosis not present

## 2017-06-16 DIAGNOSIS — L732 Hidradenitis suppurativa: Secondary | ICD-10-CM | POA: Diagnosis not present

## 2017-06-16 HISTORY — PX: COLONOSCOPY: SHX174

## 2017-06-17 DIAGNOSIS — Z9889 Other specified postprocedural states: Secondary | ICD-10-CM | POA: Diagnosis not present

## 2017-06-17 DIAGNOSIS — Z885 Allergy status to narcotic agent status: Secondary | ICD-10-CM | POA: Diagnosis not present

## 2017-06-17 DIAGNOSIS — E1165 Type 2 diabetes mellitus with hyperglycemia: Secondary | ICD-10-CM | POA: Diagnosis not present

## 2017-06-17 DIAGNOSIS — J181 Lobar pneumonia, unspecified organism: Secondary | ICD-10-CM | POA: Diagnosis not present

## 2017-06-17 DIAGNOSIS — Z880 Allergy status to penicillin: Secondary | ICD-10-CM | POA: Diagnosis not present

## 2017-06-17 DIAGNOSIS — F17200 Nicotine dependence, unspecified, uncomplicated: Secondary | ICD-10-CM | POA: Diagnosis not present

## 2017-06-17 DIAGNOSIS — R918 Other nonspecific abnormal finding of lung field: Secondary | ICD-10-CM | POA: Diagnosis not present

## 2017-06-17 DIAGNOSIS — Z881 Allergy status to other antibiotic agents status: Secondary | ICD-10-CM | POA: Diagnosis not present

## 2017-06-17 DIAGNOSIS — R0989 Other specified symptoms and signs involving the circulatory and respiratory systems: Secondary | ICD-10-CM | POA: Diagnosis not present

## 2017-06-17 DIAGNOSIS — R05 Cough: Secondary | ICD-10-CM | POA: Diagnosis not present

## 2017-06-17 DIAGNOSIS — Z883 Allergy status to other anti-infective agents status: Secondary | ICD-10-CM | POA: Diagnosis not present

## 2017-06-17 DIAGNOSIS — Z882 Allergy status to sulfonamides status: Secondary | ICD-10-CM | POA: Diagnosis not present

## 2017-06-19 ENCOUNTER — Ambulatory Visit (INDEPENDENT_AMBULATORY_CARE_PROVIDER_SITE_OTHER): Payer: 59

## 2017-06-19 DIAGNOSIS — Z23 Encounter for immunization: Secondary | ICD-10-CM | POA: Diagnosis not present

## 2017-06-22 ENCOUNTER — Other Ambulatory Visit: Payer: Self-pay | Admitting: Internal Medicine

## 2017-06-22 ENCOUNTER — Ambulatory Visit (INDEPENDENT_AMBULATORY_CARE_PROVIDER_SITE_OTHER)
Admission: RE | Admit: 2017-06-22 | Discharge: 2017-06-22 | Disposition: A | Discharge status: Home / Self Care | Payer: 59 | Source: Ambulatory Visit | Attending: Pulmonary Disease | Admitting: Pulmonary Disease

## 2017-06-22 DIAGNOSIS — R05 Cough: Secondary | ICD-10-CM | POA: Diagnosis not present

## 2017-06-22 DIAGNOSIS — J181 Lobar pneumonia, unspecified organism: Secondary | ICD-10-CM | POA: Diagnosis not present

## 2017-06-22 DIAGNOSIS — J189 Pneumonia, unspecified organism: Secondary | ICD-10-CM

## 2017-06-22 DIAGNOSIS — I471 Supraventricular tachycardia, unspecified: Secondary | ICD-10-CM

## 2017-06-22 HISTORY — DX: Supraventricular tachycardia, unspecified: I47.10

## 2017-06-22 HISTORY — DX: Supraventricular tachycardia: I47.1

## 2017-06-24 ENCOUNTER — Encounter: Payer: Self-pay | Admitting: Pulmonary Disease

## 2017-06-24 ENCOUNTER — Other Ambulatory Visit (INDEPENDENT_AMBULATORY_CARE_PROVIDER_SITE_OTHER): Payer: 59 | Admitting: Pulmonary Disease

## 2017-06-24 ENCOUNTER — Other Ambulatory Visit (INDEPENDENT_AMBULATORY_CARE_PROVIDER_SITE_OTHER): Payer: 59

## 2017-06-24 ENCOUNTER — Ambulatory Visit (INDEPENDENT_AMBULATORY_CARE_PROVIDER_SITE_OTHER): Payer: 59 | Admitting: Pulmonary Disease

## 2017-06-24 VITALS — BP 130/82 | HR 107 | Temp 99.0°F

## 2017-06-24 DIAGNOSIS — R000 Tachycardia, unspecified: Secondary | ICD-10-CM | POA: Diagnosis not present

## 2017-06-24 DIAGNOSIS — R531 Weakness: Secondary | ICD-10-CM

## 2017-06-24 DIAGNOSIS — I471 Supraventricular tachycardia: Secondary | ICD-10-CM

## 2017-06-24 DIAGNOSIS — R42 Dizziness and giddiness: Secondary | ICD-10-CM | POA: Insufficient documentation

## 2017-06-24 DIAGNOSIS — J3089 Other allergic rhinitis: Secondary | ICD-10-CM

## 2017-06-24 DIAGNOSIS — L732 Hidradenitis suppurativa: Secondary | ICD-10-CM

## 2017-06-24 DIAGNOSIS — J302 Other seasonal allergic rhinitis: Secondary | ICD-10-CM

## 2017-06-24 DIAGNOSIS — J45998 Other asthma: Secondary | ICD-10-CM

## 2017-06-24 DIAGNOSIS — E1065 Type 1 diabetes mellitus with hyperglycemia: Secondary | ICD-10-CM

## 2017-06-24 LAB — COMPREHENSIVE METABOLIC PANEL
ALK PHOS: 86 U/L (ref 39–117)
ALT: 9 U/L (ref 0–35)
AST: 8 U/L (ref 0–37)
Albumin: 4 g/dL (ref 3.5–5.2)
BILIRUBIN TOTAL: 0.5 mg/dL (ref 0.2–1.2)
BUN: 15 mg/dL (ref 6–23)
CO2: 25 mEq/L (ref 19–32)
CREATININE: 0.88 mg/dL (ref 0.40–1.20)
Calcium: 9.7 mg/dL (ref 8.4–10.5)
Chloride: 101 mEq/L (ref 96–112)
GFR: 77.37 mL/min (ref >60.00)
GLUCOSE: 192 mg/dL — AB (ref 70–99)
Potassium: 3.6 mEq/L (ref 3.5–5.1)
SODIUM: 136 meq/L (ref 135–145)
TOTAL PROTEIN: 7.1 g/dL (ref 6.0–8.3)

## 2017-06-24 LAB — URINALYSIS, ROUTINE W REFLEX MICROSCOPIC
Ketones, ur: 15 — AB
Leukocytes, UA: NEGATIVE
Nitrite: NEGATIVE
TOTAL PROTEIN, URINE-UPE24: 100 — AB
URINE GLUCOSE: 250 — AB
UROBILINOGEN UA: 1 (ref 0.0–1.0)
pH: 6 (ref 5.0–8.0)

## 2017-06-24 LAB — CBC WITH DIFFERENTIAL/PLATELET
BASOS ABS: 0.1 10*3/uL (ref 0.0–0.1)
Basophils Relative: 1.2 % (ref 0.0–3.0)
EOS ABS: 0.4 10*3/uL (ref 0.0–0.7)
Eosinophils Relative: 5 % (ref 0.0–5.0)
HCT: 46.3 % — ABNORMAL HIGH (ref 36.0–46.0)
Hemoglobin: 15.5 g/dL — ABNORMAL HIGH (ref 12.0–15.0)
LYMPHS ABS: 2.7 10*3/uL (ref 0.7–4.0)
Lymphocytes Relative: 33 % (ref 12.0–46.0)
MCHC: 33.5 g/dL (ref 30.0–36.0)
MCV: 92.9 fl (ref 78.0–100.0)
MONO ABS: 0.5 10*3/uL (ref 0.1–1.0)
Monocytes Relative: 6.5 % (ref 3.0–12.0)
NEUTROS ABS: 4.5 10*3/uL (ref 1.4–7.7)
NEUTROS PCT: 54.3 % (ref 43.0–77.0)
PLATELETS: 336 10*3/uL (ref 150.0–400.0)
RBC: 4.99 Mil/uL (ref 3.87–5.11)
RDW: 12.8 % (ref 11.5–15.5)
WBC: 8.3 10*3/uL (ref 4.0–10.5)

## 2017-06-24 LAB — SEDIMENTATION RATE: Sed Rate: 14 mm/hr (ref 0–20)

## 2017-06-24 LAB — TSH: TSH: 1.29 u[IU]/mL (ref 0.35–4.50)

## 2017-06-25 ENCOUNTER — Encounter: Payer: Self-pay | Admitting: Cardiology

## 2017-06-25 ENCOUNTER — Ambulatory Visit (INDEPENDENT_AMBULATORY_CARE_PROVIDER_SITE_OTHER): Payer: 59 | Admitting: Cardiology

## 2017-06-25 ENCOUNTER — Other Ambulatory Visit: Payer: 59

## 2017-06-25 ENCOUNTER — Ambulatory Visit (INDEPENDENT_AMBULATORY_CARE_PROVIDER_SITE_OTHER): Payer: 59

## 2017-06-25 VITALS — BP 110/78 | HR 112 | Resp 16 | Ht 65.0 in | Wt 240.0 lb

## 2017-06-25 DIAGNOSIS — R000 Tachycardia, unspecified: Secondary | ICD-10-CM | POA: Diagnosis not present

## 2017-06-25 DIAGNOSIS — R079 Chest pain, unspecified: Secondary | ICD-10-CM

## 2017-06-25 DIAGNOSIS — R002 Palpitations: Secondary | ICD-10-CM

## 2017-06-25 DIAGNOSIS — R0600 Dyspnea, unspecified: Secondary | ICD-10-CM

## 2017-06-25 NOTE — Patient Instructions (Signed)
Medication Instructions:  Your physician recommends that you continue on your current medications as directed. Please refer to the Current Medication list given to you today.   Labwork: TODAY:  MAGNESIUM & DDIMER  Testing/Procedures: Your physician has recommended that you wear an event monitor. Event monitors are medical devices that record the heart's electrical activity. Doctors most often [place] these monitors to diagnose arrhythmias. Arrhythmias are problems with the speed or rhythm of the heartbeat. The monitor is a small, portable device. You can wear one while you do your normal daily activities. This is usually used to diagnose what is causing palpitations/syncope (passing out).    Follow-Up: Your physician recommends that you schedule a follow-up appointment in: [place], PA-C   Any Other Special Instructions Will Be Listed Below (If Applicable).   Cardiac Event Monitoring A cardiac event monitor is a small recording device that is used to detect abnormal heart rhythms (arrhythmias). The monitor is used to record your heart rhythm when you have symptoms, such as:  Fast heartbeats (palpitations), such as heart racing or fluttering.  Dizziness.  Fainting or light-headedness.  Unexplained weakness.  Some monitors are wired to electrodes placed on your chest. Electrodes are flat, sticky disks that attach to your skin. Other monitors may be hand-held or worn on the wrist. The monitor can be worn for up to 30 days. If the monitor is attached to your chest, a technician will prepare your chest for the electrode placement and show you how to work the monitor. Take time to practice using the monitor before you leave the office. Make sure you understand how to send the information from the monitor to your health care provider. In some cases, you may need to use a landline telephone instead of a cell phone. What are the risks? Generally, this device is safe to use,  but it possible that the skin under the electrodes will become irritated. How to use your cardiac event monitor  Wear your monitor at all times, except when you are in water: ? Do not let the monitor get wet. ? Take the monitor off when you bathe. Do not swim or use a hot tub with it on.  Keep your skin clean. Do not put body lotion or moisturizer on your chest.  Change the electrodes as told by your health care provider or any time they stop sticking to your skin. You may need to use medical tape to keep them on.  Try to put the electrodes in slightly different places on your chest to help prevent skin irritation. They must remain in the area under your left breast and in the upper right section of your chest.  Make sure the monitor is safely clipped to your clothing or in a location close to your body that your health care provider recommends.  Press the button to record as soon as you feel heart-related symptoms, such as: ? Dizziness. ? Weakness. ? Light-headedness. ? Palpitations. ? Thumping or pounding in your chest. ? Shortness of breath. ? Unexplained weakness.  Keep a diary of your activities, such as walking, doing chores, and taking medicine. It is very important to note what you were doing when you pushed the button to record your symptoms. This will help your health care provider determine what might be contributing to your symptoms.  Send the recorded information as recommended by your health care provider. It may take some time for your health care provider to process the results.  Change the batteries as told by your health care provider.  Keep electronic devices away from your monitor. This includes: ? Tablets. ? MP3 players. ? Cell phones.  While wearing your monitor you should avoid: ? Electric blankets. ? Armed forces operational officer. ? Electric toothbrushes. ? Microwave ovens. ? Magnets. ? Metal detectors. Get help right away if:  You have chest pain.  You have  extreme difficulty breathing or shortness of breath.  You develop a very fast heartbeat that persists.  You develop dizziness that does not go away.  You faint or constantly feel like you are about to faint. Summary  A cardiac event monitor is a small recording device that is used to help detect abnormal heart rhythms (arrhythmias).  The monitor is used to record your heart rhythm when you have heart-related symptoms.  Make sure you understand how to send the information from the monitor to your health care provider.  It is important to press the button on the monitor when you have any heart-related symptoms.  Keep a diary of your activities, such as walking, doing chores, and taking medicine. It is very important to note what you were doing when you pushed the button to record your symptoms. This will help your health care provider learn what might be causing your symptoms. This information is not intended to replace advice given to you by your health care provider. Make sure you discuss any questions you have with your health care provider. Document Released: 06/17/2008 Document Revised: 08/23/2016 Document Reviewed: 08/23/2016 Elsevier Interactive Patient Education  2017 [employer].   If you need a refill on your cardiac medications before your next appointment, please call your pharmacy.

## 2017-06-25 NOTE — Progress Notes (Signed)
06/25/2017 [name]   [birth year]  [id number]  Primary Physician Barbara Stains, MD Primary Cardiologist: Dr. Acie Fitzgerald  Reason for Visit/CC: Palpitations, chest pain and dyspnea  HPI:  [name] is a 36 y.o. female, followed by Dr. Acie Fitzgerald with a h/o palpitations/ tachycardia, DM, tobacco, normal cath 06/2014, normal EF by echo in 2015 who presents to clinic today with complaint of recurrent palpitations.   Pt was evaluated by Dr. Cathie Fitzgerald in 2015 for palpitations and chest pain and had negative cardiac w/u. It was concluded that her palpitations/ sinus tach was related to hormonal changes (intermittent symptoms associated with periods). Conservative management recommended. Pt advised to f/u as needed.    She works as a Technical brewer at [employer].  Her symptoms started yesterday morning when she first arrived to work. She felt fine the days/ weeks prior, other than some mild fatigue. Yesterday she walked a short distance from the parking lot at work to her clinic and felt exhausted with exertional dyspnea, left sided pleuritic chest pain radiating to left arm, elevated HR/ palpitations, dizziness and near syncope. They checked her pulse rate and it was elevated in the 140s. This lasted ~20 min before improving. Dr. Lenna Fitzgerald obtained an EKG which showed NSR. Labs were drawn. CMP and TSH WNL. K 3.6. CBC also WNL. No anemia. Hgb 15. UA negative for UTI. No Mg nor d-dimer checked.  Today in our office, she reports that she had a similar episode while driving to her appt. Felt poorly, dizzy and increased HR. She is currently CP free. EKG shows NSR 96 bpm. BP is 110/78. She reports good PO intake. She denies caffeine. No ETOH. Only OTC meds she has taken recently is Tylenol. She is on birth control but not oral. She has an IUD. She notes bilateral leg soreness, but no swelling. She denies any recent prolonged travel. No h/o blood clots.   Current Meds  Medication Sig  . acetaminophen (TYLENOL)  500 MG tablet Take 500 mg by mouth.  . doxycycline (VIBRA-TABS) 100 MG tablet Take 1 tablet (100 mg total) by mouth 2 (two) times daily. (Patient taking differently: Take 100 mg by mouth 2 (two) times daily as needed (flare up of hidradenitis). )  . finasteride (PROSCAR) 5 MG tablet Take 5 mg by mouth daily.  Marland Kitchen glucose blood ([employer] TEST STRIPS) test strip 1 strip by Misc.(Non-Drug; Combo Route) route 4 times daily with meals and nightly. contour next glucose test strips  . insulin glargine (LANTUS) 100 UNIT/ML injection Inject 50 Units into the skin at bedtime as needed (blod sugar). Reported on 03/21/2016  . Insulin Human (INSULIN PUMP) SOLN Inject into the skin continuous. *Novolog*  . levonorgestrel (MIRENA) 20 MCG/24HR IUD 1 each by Intrauterine route once. Placed 04/2013  . pantoprazole (PROTONIX) 40 MG tablet Take 1 tablet (40 mg total) by mouth daily.  Marland Kitchen spironolactone (ALDACTONE) 100 MG tablet Take 1 tablet (100 mg total) by mouth daily.  Marland Kitchen VIIBRYD 40 MG TABS TAKE 1 TABLET BY MOUTH ONCE DAILY   Allergies  Allergen Reactions  . Latex Hives, Shortness Of Breath and Rash  . Levofloxacin Shortness Of Breath and Rash  . Moxifloxacin Shortness Of Breath and Rash  . Oxycodone-Acetaminophen Shortness Of Breath, Swelling and Rash    NORCO/VICODIN OK  . Peach [Prunus Persica] Hives and Shortness Of Breath  . Potassium-Containing Compounds Other (See Comments)    IV ROUTE - CAUSES VEINS TO COLLAPS; Reports that it is undiluted K only  .  Prednisone Other (See Comments)    SEVERE ELEVATION OF BLOOD SUGAR. Able to tolerate 40 mg  . Propoxyphene N-Acetaminophen Swelling    SWELLING OF FACE AND THROAT  . Rosiglitazone Maleate Swelling    SWELLING OF FACE AND LEGS  . Xolair [Omalizumab] Other (See Comments)    Rash and anaphylaxis  . Adhesive [Tape] Hives, Itching and Rash  . Morphine And Related Other (See Comments)    Causes hallucinations  . Prozac [Fluoxetine Hcl] Other (See Comments)     Made her very aggressive   . Chantix [Varenicline]     dreams  . Citrullus Vulgaris Nausea And Vomiting    Facial swelling  . Clindamycin/Lincomycin   . Humira [Adalimumab]     Does not remember   . Septra [Sulfamethoxazole-Trimethoprim]   . Cefaclor Rash  . Keflex [Cephalexin] Diarrhea and Rash    REACTION: severe migraine  . Promethazine Hcl Other (See Comments)    IV ROUTE ONLY - JITTERY FEELING. Patient reports that it is mild and she has used promethazine since then  PO tablet ok  . Sulfadiazine Rash   Past Medical History:  Diagnosis Date  . Abscess of left axilla - PREVOTELLA BIVIA & Staph Coag Neg 07/12/2012   MODERATE PREVOTELLA BIVIA Note: BETA LACTAMASE NEGATIVE    . Asthma    as a child  . Cancer ([place])    skin tag rt breast  . Cellulitis 06/01/2014   RT INNER THIGH  . Depression    hx  . Diabetes ([place])   . Diabetes mellitus    IDDM, Insulin pump; followed by [name]  . GERD (gastroesophageal reflux disease)    no current meds.  . Heart murmur   . Hidradenitis 04/2012   bilat. thighs, left groin - open areas on thighs  . Hx MRSA infection   . Hydradenitis   . Migraine   . PCOS (polycystic ovarian syndrome)   . Pneumonia    hx  . Vitamin D insufficiency    Family History  Problem Relation Age of Onset  . Hyperlipidemia Sister   . Hypertension Sister   . Hypertension Father    Past Surgical History:  Procedure Laterality Date  . BREAST SURGERY     right lumpectomy  . BUNIONECTOMY     left  . CARDIAC CATHETERIZATION    . CHOLECYSTECTOMY  12/02/2006   lap. chole.  Marland Kitchen DILATION AND EVACUATION  02/14/2007  . ESOPHAGOGASTRODUODENOSCOPY  11/17/2011   Procedure: ESOPHAGOGASTRODUODENOSCOPY (EGD);  Surgeon: Barbara Ng, MD;  Location: [facility];  Service: Endoscopy;  Laterality: N/A;  . EYE SURGERY     exc. stye left eye  . HYDRADENITIS EXCISION  04/30/2012   Procedure: EXCISION HYDRADENITIS GROIN;  Surgeon: Barbara Bowie, MD;   Location: [place];  Service: General;  Laterality: Left;  wide excision hidradenitis bilateral thighs and Left groin  . HYDRADENITIS EXCISION Left 01/13/2014   Procedure: WIDE EXCISION HIDRADENITIS LEFT AXILLA;  Surgeon: Barbara Bowie, MD;  Location: [facility];  Service: General;  Laterality: Left;  . IRRIGATION AND DEBRIDEMENT ABSCESS Right 06/02/2014   Procedure: IRRIGATION AND DEBRIDEMENT ABSCESS;  Surgeon: [name], MD;  Location: [facility];  Service: General;  Laterality: Right;  . IRRIGATION AND DEBRIDEMENT ABSCESS Left 09/23/2014   Procedure: IRRIGATION AND DEBRIDEMENT ABSCESS/LEFT THIGH;  Surgeon: [name], MD;  Location: [place] [facility];  Service: General;  Laterality: Left;  . LEFT HEART CATHETERIZATION WITH CORONARY ANGIOGRAM N/A 07/14/2014  Procedure: LEFT HEART CATHETERIZATION WITH CORONARY ANGIOGRAM;  Surgeon: [name], MD;  [facility];  Service: Cardiovascular;  Laterality: N/A;  . TONSILLECTOMY    . WISDOM TOOTH EXTRACTION     Social History   Social History  . Marital status: Single    Spouse name: N/A  . Number of children: N/A  . Years of education: N/A   Occupational History  . Not on file.   Social History Main Topics  . Smoking status: Current Every Day Smoker    Packs/day: 0.50    Years: 14.00    Types: Cigarettes  . Smokeless tobacco: Never Used  . Alcohol use No  . Drug use: No  . Sexual activity: Yes    Birth control/ protection: None   Other Topics Concern  . Not on file   Social History Narrative  . No narrative on file     Review of Systems: General: negative for chills, fever, night sweats or weight changes.  Cardiovascular: negative for chest pain, dyspnea on exertion, edema, orthopnea, palpitations, paroxysmal nocturnal dyspnea or shortness of breath Dermatological: negative for rash Respiratory: negative for cough or wheezing Urologic: negative for hematuria Abdominal: negative for nausea, vomiting,  diarrhea, bright red blood per rectum, melena, or hematemesis Neurologic: negative for visual changes, syncope, or dizziness All other systems reviewed and are otherwise negative except as noted above.   Physical Exam:  Blood pressure 110/78, pulse (!) 112, resp. rate 16, height 5\' 5"  (1.651 m), weight 240 lb (108.9 kg), SpO2 98 %.  General appearance: alert, cooperative and no distress Neck: no carotid bruit and no JVD Lungs: clear to auscultation bilaterally Heart: regular rate and rhythm, S1, S2 normal, no murmur, click, rub or gallop Extremities: extremities normal, atraumatic, no cyanosis or edema Pulses: 2+ and symmetric Skin: Skin color, texture, turgor normal. No rashes or lesions Neurologic: Grossly normal  EKG NSR 96 bpm -- personally reviewed    Previous Cardiac Studies  2D Echo 06/2014 Study Conclusions  - Left ventricle: The cavity size was normal. Wall thickness was normal. Systolic function was normal. The estimated ejection fraction was in the range of 60% to 65%. Wall motion was normal; there were no regional wall motion abnormalities.  LHC 06/2017 Hemodynamic Findings: Central aortic pressure: 110/66 Left ventricular pressure: 117/6/15  Angiographic Findings:  Left main:  Long segment. No obstructive disease.   Left Anterior Descending Artery: Large caliber vessel that courses to the apex. There are several small caliber diagonal branches. No obstructive disease.   Circumflex Artery: Moderate caliber vessel with several small caliber obtuse marginal branches. No obstructive disease.   Right Coronary Artery: Large dominant vessel with no obstructive disease.   Left Ventricular Angiogram: LVEF=55-60%.   Impression: 1. No angiographic evidence of CAD 2. Normal LV systolic function 3. Non-cardiac chest pain      ASSESSMENT AND PLAN:   1. 36 y/o obese female smoker presenting with multiple complaints including recent increased HR,  palpitations, fatigue, dyspnea, pleuritic left sided chest pain, dizziness and near syncope. Episodes occurs in spells. She has had 2 events within the past 24 hrs. She is currently asymptomatic. EKG today shows NSR, 96 bpm. BP is stable. Labs were obtained yesterday, ordered by Dr. Lenna Fitzgerald. CMP, CBC, TSH, and UA all unremarkable/ WNL. We will check a d-dimer today to r/o PE as well as a Mg level. If d-dimer is positive, she will need a chest CT. We will also arrange a 2 week heart  monitor to screen for arrhythmias. Pt advised to get adequate rest and to stay well hydrated with fluids. Avoid triggers that can increase HR. F/u after monitor.    [name], MHS [facility] 06/25/2017 9:41 AM

## 2017-06-26 ENCOUNTER — Other Ambulatory Visit: Payer: 59

## 2017-06-26 ENCOUNTER — Telehealth: Payer: Self-pay | Admitting: Cardiology

## 2017-06-26 LAB — MAGNESIUM: MAGNESIUM: 1.6 mg/dL (ref 1.6–2.3)

## 2017-06-26 LAB — D-DIMER, QUANTITATIVE (NOT AT ARMC): D-DIMER: 0.37 mg{FEU}/L (ref 0.00–0.49)

## 2017-06-27 NOTE — Progress Notes (Signed)
Subjective:     Patient ID: [name], female   DOB: 1981-06-12, 36 y.o.   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 Harlan Fitzgerald, [facility]...    [name] has a remote hx of [facility] eval in 2015>  She noted tachycardia w/ min activity & assoc CP/ tighhtness;  Myoview was abnormal w/ +anterior ischemia and global HK w/ LVD (EF=41%);  She had Cardiac Cath by [name] which revealed normal coronaries, and normal EF=55-60% (felt to have noncardiac CP & rec to quit smoking, tight BS control, ?relation to her menses)...      [name] last saw [place]- Endocrine/DM at [facility] 05/05/16>  On insulin pump and mult f/u phone calls; no old care everywhere labs avail to review; her last A1c in Epic was 12/2016 = 13.2    She is followed by [name] at [facility], Barbara & last seen 02/23/17> hidradenitis suppurativa & she has proved intol/allergic to mult meds including clinda, sulfa, cefaclor, Humira, rifampin, dapsone; currently using Spironolactone, Doxy, Finasteride, clinda gel & silvadene cream; they started Remicade...    She was seen by GI- [name] at [facility] and had a colonoscopy 06/16/17>  Hx GERD, hematochezia, diarrhea, and concern for poss Crohns;  colonoscpy showed one rectal polyp removed=> tubular adenoma, several colonic bxs obtained=> all normal colonic mucosa (no Crohns)    She recently went to [facility] ER 06/17/17 w/ cough & right CP, she still smokes 1/2ppd, she was Afeb/ VSS/ O2sat=97% on RA, Chest clear,  CXR showed low lung vols & bibasilar interstitial change; Labs revealed CBC- wnl w/ wbc=8.8 & norm diff, Chems ok x BS=395; EKG was wnl w/ NSR rate73;  She was said to have "pneumonia" and treated w/ ZPak given her mult antibiotic allergies...      [name] was in the office this AM and noted palpit, SOB, weakness, noting aching/ sore,  sl sinus pressure, no f/c/s;  Exam showed SVT w/ pulse>150 & regular;  We had her lie down & did EKG but by then heart rate had diminished to ~110/min & still regular;  EKG showed STachy & NSSTTWA;  She tells me that she has been INTOL to BBlockers in the past;  We discussed checking blood work (see below) and go home to rest, no strenuous activ, and f/u w/ [facility]- asap... EXAM shows Afeb, VS- initial pulse >150/min, then spont decr to ~110/min, O2sat=98% on RA;  Heent- neg, mallampati3;  Chest- clear w/o w/r/r;  Heart- reg tachy gr1/6 SEM w/o r/g;  Abd- obese soft nontender; Ext- VI, tr edema w/o c/c;  Neuro- intact w/o focal abn...  EKG>  STachy, 114/min, NSSTTWA...  LABS 06/24/17>  Chems- ok x BS=192;  CBC- wnl w/ Hg=15.5;  TSH=1.29;  Sed=14;  Urine +prot, gluc ketones...   IMP/PLAN>>      1)  SVT>  Regular SVT detected on exam but by the time we got her on the monitor & checked EKG she had settled down to ~110/min STachy w/ NSSTTWA;  Prev cardiac w/u by [name]/[facility] in 2015; we will have her ret to [facility] for Holter monitor & further eval...    2)  IDDM, type1> on insulin pump and followed by endocrine/DM at [facility].Marland KitchenMarland Kitchen    3)  Hx hidradenitis suppurativa managed by Derm at [facility].Marland KitchenMarland Kitchen    4)  Recent GI eval at  [facility] to check for Crohn's (and poss connection to her hidradenitis) but w/u was NEG- bxs w/ normal colonic mucosa; she has GERD, one tubular adenoma, hx diarrhea...    5)  Medical issues per [name] and pt's GYN...     Past Medical History:  Diagnosis Date  . Abscess of left axilla - PREVOTELLA BIVIA & Staph Coag Neg 07/12/2012   MODERATE PREVOTELLA BIVIA Note: BETA LACTAMASE NEGATIVE    . Asthma    as a child  . Cancer ([place])    skin tag rt breast  . Cellulitis 06/01/2014   RT INNER THIGH  . Depression    hx  . Diabetes ([place])   . Diabetes mellitus    IDDM, Insulin pump; followed by Dr. Delrae Fitzgerald  . GERD (gastroesophageal reflux disease)    no current meds.  . Heart murmur   .  Hidradenitis 04/2012   bilat. thighs, left groin - open areas on thighs  . Hx MRSA infection   . Hydradenitis   . Migraine   . PCOS (polycystic ovarian syndrome)   . Pneumonia    hx  . Vitamin D insufficiency     Past Surgical History:  Procedure Laterality Date  . BREAST SURGERY     right lumpectomy  . BUNIONECTOMY     left  . CARDIAC CATHETERIZATION    . CHOLECYSTECTOMY  12/02/2006   lap. chole.  Marland Kitchen DILATION AND EVACUATION  02/14/2007  . ESOPHAGOGASTRODUODENOSCOPY  11/17/2011   Procedure: ESOPHAGOGASTRODUODENOSCOPY (EGD);  Surgeon: [name], MD;  Location: [facility];  Service: Endoscopy;  Laterality: N/A;  . EYE SURGERY     exc. stye left eye  . HYDRADENITIS EXCISION  04/30/2012   Procedure: EXCISION HYDRADENITIS GROIN;  Surgeon: [name], MD;  Location: [place];  Service: General;  Laterality: Left;  wide excision hidradenitis bilateral thighs and Left groin  . HYDRADENITIS EXCISION Left 01/13/2014   Procedure: WIDE EXCISION HIDRADENITIS LEFT AXILLA;  Surgeon: [name], MD;  Location: [place];  Service: General;  Laterality: Left;  . IRRIGATION AND DEBRIDEMENT ABSCESS Right 06/02/2014   Procedure: IRRIGATION AND DEBRIDEMENT ABSCESS;  Surgeon: [name], MD;  Location: [place];  Service: General;  Laterality: Right;  . IRRIGATION AND DEBRIDEMENT ABSCESS Left 09/23/2014   Procedure: IRRIGATION AND DEBRIDEMENT ABSCESS/LEFT THIGH;  Surgeon: [name], MD;  Location: [place];  Service: General;  Laterality: Left;  . LEFT HEART CATHETERIZATION WITH CORONARY ANGIOGRAM N/A 07/14/2014   Procedure: LEFT HEART CATHETERIZATION WITH CORONARY ANGIOGRAM;  Surgeon: [name], MD;  Location: [facility] CATH LAB;  Service: Cardiovascular;  Laterality: N/A;  . TONSILLECTOMY    . WISDOM TOOTH EXTRACTION      Outpatient Encounter Prescriptions as of 06/24/2017  Medication Sig  . doxycycline (VIBRA-TABS) 100 MG tablet Take 1 tablet (100 mg total) by  mouth 2 (two) times daily. (Patient taking differently: Take 100 mg by mouth 2 (two) times daily as needed (flare up of hidradenitis). )  . finasteride (PROSCAR) 5 MG tablet Take 5 mg by mouth daily.  . insulin glargine (LANTUS) 100 UNIT/ML injection Inject 50 Units into the skin at bedtime as needed (blod sugar). Reported on 03/21/2016  . Insulin Human (INSULIN PUMP) SOLN Inject into the skin continuous. *Novolog*  . levonorgestrel (MIRENA) 20 MCG/24HR IUD 1 each by Intrauterine route once. Placed 04/2013  . pantoprazole (PROTONIX) 40 MG tablet Take 1 tablet (40 mg total) by mouth daily.  Marland Kitchen spironolactone (ALDACTONE) 100  MG tablet Take 1 tablet (100 mg total) by mouth daily.  Marland Kitchen VIIBRYD 40 MG TABS TAKE 1 TABLET BY MOUTH ONCE DAILY  . [DISCONTINUED] ALPRAZolam (XANAX) 0.25 MG tablet 1-2 every 6 hours if needed for anxiety  . [DISCONTINUED] azithromycin (ZITHROMAX) 250 MG tablet 2 today then one daily (Patient not taking: Reported on 01/01/2017)  . [DISCONTINUED] azithromycin (ZITHROMAX) 250 MG tablet 2 today then one daily (Patient not taking: Reported on 04/24/2017)  . [DISCONTINUED] diphenhydrAMINE (BENADRYL) 25 mg capsule Take 1 capsule (25 mg total) by mouth every 6 (six) hours as needed. Take with Reglan for nausea control (Patient not taking: Reported on 01/01/2017)  . [DISCONTINUED] fluconazole (DIFLUCAN) 150 MG tablet Take 1 tablet (150 mg total) by mouth daily as needed (for yeast infections). Takes when she takes doxycycline (Patient not taking: Reported on 01/01/2017)  . [DISCONTINUED] HYDROcodone-acetaminophen (NORCO/VICODIN) 5-325 MG tablet Take 1-2 tablets by mouth every 4 (four) hours as needed for moderate pain or severe pain. (Patient not taking: Reported on 01/01/2017)  . [DISCONTINUED] metoCLOPramide (REGLAN) 10 MG tablet Take 1 tablet (10 mg total) by mouth every 6 (six) hours. (Patient not taking: Reported on 01/01/2017)  . [DISCONTINUED] ondansetron (ZOFRAN ODT) 4 MG disintegrating tablet  Take 1 tablet (4 mg total) by mouth every 4 (four) hours as needed for nausea or vomiting. (Patient not taking: Reported on 01/01/2017)  . [DISCONTINUED] varenicline (CHANTIX PAK) 0.5 MG X 11 & 1 MG X 42 tablet Take one 0.5 mg tab by mouth once daily for 3 days, then  one 0.5 mg tab twice daily for 4 days, then  1 mg tab twice daily. (Patient not taking: Reported on 04/24/2017)   No facility-administered encounter medications on file as of 06/24/2017.     Allergies  Allergen Reactions  . Latex Hives, Shortness Of Breath and Rash  . Levofloxacin Shortness Of Breath and Rash  . Moxifloxacin Shortness Of Breath and Rash  . Oxycodone-Acetaminophen Shortness Of Breath, Swelling and Rash    NORCO/VICODIN OK  . Peach [Prunus Persica] Hives and Shortness Of Breath  . Potassium-Containing Compounds Other (See Comments)    IV ROUTE - CAUSES VEINS TO COLLAPS; Reports that it is undiluted K only  . Prednisone Other (See Comments)    SEVERE ELEVATION OF BLOOD SUGAR. Able to tolerate 40 mg  . Propoxyphene N-Acetaminophen Swelling    SWELLING OF FACE AND THROAT  . Rosiglitazone Maleate Swelling    SWELLING OF FACE AND LEGS  . Xolair [Omalizumab] Other (See Comments)    Rash and anaphylaxis  . Adhesive [Tape] Hives, Itching and Rash  . Morphine And Related Other (See Comments)    Causes hallucinations  . Prozac [Fluoxetine Hcl] Other (See Comments)    Made her very aggressive   . Chantix [Varenicline]     dreams  . Citrullus Vulgaris Nausea And Vomiting    Facial swelling  . Clindamycin/Lincomycin   . Humira [Adalimumab]     Does not remember   . Septra [Sulfamethoxazole-Trimethoprim]   . Cefaclor Rash  . Keflex [Cephalexin] Diarrhea and Rash    REACTION: severe migraine  . Promethazine Hcl Other (See Comments)    IV ROUTE ONLY - JITTERY FEELING. Patient reports that it is mild and she has used promethazine since then  PO tablet ok  . Sulfadiazine Rash    Family History  Problem Relation  Age of Onset  . Hyperlipidemia Sister   . Hypertension Sister   . Hypertension Father  Social History   Social History  . Marital status: Single    Spouse name: N/A  . Number of children: N/A  . Years of education: N/A   Occupational History  . Not on file.   Social History Main Topics  . Smoking status: Current Every Day Smoker    Packs/day: 0.50    Years: 14.00    Types: Cigarettes  . Smokeless tobacco: Never Used  . Alcohol use No  . Drug use: No  . Sexual activity: Yes    Birth control/ protection: None   Other Topics Concern  . Not on file   Social History Narrative  . No narrative on file    Current Medications, Allergies, Past Medical History, Past Surgical History, Family History, and Social History were reviewed in [employer] record.    Review of Systems         All symptoms NEG except where BOLDED >>  Constitutional:  F/C/S, fatigue, anorexia, unexpected weight change. HEENT:  HA, visual changes, hearing loss, earache, nasal symptoms, sore throat, mouth sores, hoarseness. Resp:  cough, sputum, hemoptysis; SOB, tightness, wheezing. Cardio:  CP, palpit, DOE, orthopnea, edema. GI:  N/V/D/C, blood in stool; reflux, abd pain, distention, gas. GU:  dysuria, freq, urgency, hematuria, flank pain, voiding difficulty. MS:  joint pain, swelling, tenderness, decr ROM; neck pain, back pain, etc. Neuro:  HA, tremors, seizures, dizziness, syncope, weakness, numbness, gait abn. Skin:  suspicious lesions or skin rash. Heme:  adenopathy, bruising, bleeding. Psyche:  confusion, agitation, sleep disturbance, hallucinations, anxiety, depression suicidal.       Objective:   Physical Exam   Vital Signs:  Reviewed...   General:  WD, overweight, 36 y/o WM in NAD; alert & oriented; pleasant & cooperative... HEENT:  [place]/AT; Conjunctiva- pink, Sclera- nonicteric, EOM-wnl, PERRLA, Fundi-benign; EACs-clear, TMs-wnl; NOSE-clear; THROAT-clear & wnl.   Neck:  Supple w/ full ROM; no JVD; normal carotid impulses w/o bruits; no thyromegaly or nodules palpated; no lymphadenopathy.  Chest:  Clear to P & A; without wheezes, rales, or rhonchi heard. Heart:  Regular tachy; norm S1 & S2 Gr1/6SEM w/o rubs or gallops detected. Abdomen:  Obese soft & nontender- no guarding or rebound; normal bowel sounds; no organomegaly or masses palpated. Ext:  Normal ROM; without deformities or arthritic changes; no varicose veins, venous insuffic, or edema;  Pulses intact w/o bruits. Neuro:  CNs II-XII intact; motor testing normal; sensory testing normal; gait normal & balance OK. Derm:  Hidradenitis suppurativa Lymph:  No cervical, supraclavicular, axillary, or inguinal adenopathy palpated.     Assessment:      IMP/PLAN>>      1)  SVT>  Regular SVT detected on exam but by the time we got her on the monitor & checked EKG she had settled down to ~110/min STachy w/ NSSTTWA;  Prev cardiac w/u by [name]/[facility] in 2015; we will have her ret to [facility] for Holter monitor & further eval...    2)  IDDM, type1> on insulin pump and followed by endocrine/DM at [facility].Marland KitchenMarland Kitchen    3)  Hx hidradenitis suppurativa managed by Derm at [facility].Marland KitchenMarland Kitchen    4)  Recent GI eval at [facility] to check for Crohn's (and poss connection to her hidradenitis) but w/u was NEG- bxs w/ normal colonic mucosa; she has GERD, one tubular adenoma, hx diarrhea...    5)  Medical issues per [name] and pt's GYN...      Plan:     Patient's Medications  New Prescriptions  No medications on file  Previous Medications   ACETAMINOPHEN (TYLENOL) 500 MG TABLET    Take 500 mg by mouth.   DOXYCYCLINE (VIBRA-TABS) 100 MG TABLET    Take 1 tablet (100 mg total) by mouth 2 (two) times daily.   FINASTERIDE (PROSCAR) 5 MG TABLET    Take 5 mg by mouth daily.   GLUCOSE BLOOD ([employer] TEST STRIPS) TEST STRIP    1 strip by Misc.(Non-Drug; Combo Route) route 4 times daily with meals and nightly. contour next glucose test strips   INSULIN  GLARGINE (LANTUS) 100 UNIT/ML INJECTION    Inject 50 Units into the skin at bedtime as needed (blod sugar). Reported on 03/21/2016   INSULIN HUMAN (INSULIN PUMP) SOLN    Inject into the skin continuous. *Novolog*   LEVONORGESTREL (MIRENA) 20 MCG/24HR IUD    1 each by Intrauterine route once. Placed 04/2013   PANTOPRAZOLE (PROTONIX) 40 MG TABLET    Take 1 tablet (40 mg total) by mouth daily.   SPIRONOLACTONE (ALDACTONE) 100 MG TABLET    Take 1 tablet (100 mg total) by mouth daily.   VIIBRYD 40 MG TABS    TAKE 1 TABLET BY MOUTH ONCE DAILY  Modified Medications   No medications on file  Discontinued Medications   ALPRAZOLAM (XANAX) 0.25 MG TABLET    1-2 every 6 hours if needed for anxiety   AZITHROMYCIN (ZITHROMAX) 250 MG TABLET    2 today then one daily   AZITHROMYCIN (ZITHROMAX) 250 MG TABLET    2 today then one daily   DIPHENHYDRAMINE (BENADRYL) 25 MG CAPSULE    Take 1 capsule (25 mg total) by mouth every 6 (six) hours as needed. Take with Reglan for nausea control   FLUCONAZOLE (DIFLUCAN) 150 MG TABLET    Take 1 tablet (150 mg total) by mouth daily as needed (for yeast infections). Takes when she takes doxycycline   HYDROCODONE-ACETAMINOPHEN (NORCO/VICODIN) 5-325 MG TABLET    Take 1-2 tablets by mouth every 4 (four) hours as needed for moderate pain or severe pain.   METOCLOPRAMIDE (REGLAN) 10 MG TABLET    Take 1 tablet (10 mg total) by mouth every 6 (six) hours.   ONDANSETRON (ZOFRAN ODT) 4 MG DISINTEGRATING TABLET    Take 1 tablet (4 mg total) by mouth every 4 (four) hours as needed for nausea or vomiting.   VARENICLINE (CHANTIX PAK) 0.5 MG X 11 & 1 MG X 42 TABLET    Take one 0.5 mg tab by mouth once daily for 3 days, then  one 0.5 mg tab twice daily for 4 days, then  1 mg tab twice daily.

## 2017-06-29 ENCOUNTER — Telehealth: Payer: Self-pay | Admitting: Internal Medicine

## 2017-06-29 MED ORDER — AMOXICILLIN 500 MG PO TABS: 500.0000 mg | ORAL_TABLET | Freq: Two times a day (BID) | ORAL | 1 refills | Status: DC

## 2017-06-29 NOTE — Telephone Encounter (Signed)
Acute bronchitis. Deep cough, not yet productive. Limited med tolerance. Recent Zpak. Plan- amoxacillin sent to [facility] pharmacy

## 2017-06-30 NOTE — Telephone Encounter (Signed)
Follow up     Pt returning call about lab results, its okay to send message through My chart or leave vm

## 2017-07-01 NOTE — Telephone Encounter (Signed)
Results released to My Chart.  

## 2017-07-08 ENCOUNTER — Telehealth: Payer: Self-pay | Admitting: Cardiology

## 2017-07-08 ENCOUNTER — Emergency Department (HOSPITAL_COMMUNITY): Payer: 59

## 2017-07-08 ENCOUNTER — Encounter (HOSPITAL_COMMUNITY): Payer: Self-pay | Admitting: Neurology

## 2017-07-08 ENCOUNTER — Emergency Department (HOSPITAL_COMMUNITY)
Admission: EM | Admit: 2017-07-08 | Discharge: 2017-07-08 | Disposition: A | Discharge status: Home / Self Care | Payer: 59 | Attending: Emergency Medicine | Admitting: Emergency Medicine

## 2017-07-08 DIAGNOSIS — Z794 Long term (current) use of insulin: Secondary | ICD-10-CM | POA: Insufficient documentation

## 2017-07-08 DIAGNOSIS — Z91040 Latex allergy status: Secondary | ICD-10-CM | POA: Diagnosis not present

## 2017-07-08 DIAGNOSIS — R000 Tachycardia, unspecified: Secondary | ICD-10-CM

## 2017-07-08 DIAGNOSIS — F17210 Nicotine dependence, cigarettes, uncomplicated: Secondary | ICD-10-CM | POA: Insufficient documentation

## 2017-07-08 DIAGNOSIS — E109 Type 1 diabetes mellitus without complications: Secondary | ICD-10-CM | POA: Diagnosis not present

## 2017-07-08 DIAGNOSIS — R42 Dizziness and giddiness: Secondary | ICD-10-CM | POA: Diagnosis not present

## 2017-07-08 DIAGNOSIS — R001 Bradycardia, unspecified: Secondary | ICD-10-CM | POA: Diagnosis not present

## 2017-07-08 DIAGNOSIS — Z955 Presence of coronary angioplasty implant and graft: Secondary | ICD-10-CM | POA: Insufficient documentation

## 2017-07-08 DIAGNOSIS — R079 Chest pain, unspecified: Secondary | ICD-10-CM

## 2017-07-08 DIAGNOSIS — I499 Cardiac arrhythmia, unspecified: Secondary | ICD-10-CM | POA: Diagnosis not present

## 2017-07-08 DIAGNOSIS — Z79899 Other long term (current) drug therapy: Secondary | ICD-10-CM | POA: Diagnosis not present

## 2017-07-08 DIAGNOSIS — I471 Supraventricular tachycardia: Secondary | ICD-10-CM | POA: Insufficient documentation

## 2017-07-08 LAB — BRAIN NATRIURETIC PEPTIDE: B Natriuretic Peptide: 11.2 pg/mL (ref 0.0–100.0)

## 2017-07-08 LAB — CBC WITH DIFFERENTIAL/PLATELET
Basophils Absolute: 0.1 10*3/uL (ref 0.0–0.1)
Basophils Relative: 1 %
EOS PCT: 4 %
Eosinophils Absolute: 0.3 10*3/uL (ref 0.0–0.7)
HEMATOCRIT: 44.4 % (ref 36.0–46.0)
HEMOGLOBIN: 15.7 g/dL — AB (ref 12.0–15.0)
LYMPHS ABS: 2.6 10*3/uL (ref 0.7–4.0)
LYMPHS PCT: 31 %
MCH: 32 pg (ref 26.0–34.0)
MCHC: 35.4 g/dL (ref 30.0–36.0)
MCV: 90.4 fL (ref 78.0–100.0)
Monocytes Absolute: 0.4 10*3/uL (ref 0.1–1.0)
Monocytes Relative: 5 %
NEUTROS ABS: 5 10*3/uL (ref 1.7–7.7)
NEUTROS PCT: 59 %
Platelets: 327 10*3/uL (ref 150–400)
RBC: 4.91 MIL/uL (ref 3.87–5.11)
RDW: 12.4 % (ref 11.5–15.5)
WBC: 8.4 10*3/uL (ref 4.0–10.5)

## 2017-07-08 LAB — COMPREHENSIVE METABOLIC PANEL
ALBUMIN: 3.8 g/dL (ref 3.5–5.0)
ALT: 13 U/L — ABNORMAL LOW (ref 14–54)
AST: 15 U/L (ref 15–41)
Alkaline Phosphatase: 84 U/L (ref 38–126)
Anion gap: 12 (ref 5–15)
BILIRUBIN TOTAL: 0.8 mg/dL (ref 0.3–1.2)
BUN: 15 mg/dL (ref 6–20)
CO2: 20 mmol/L — AB (ref 22–32)
Calcium: 9 mg/dL (ref 8.9–10.3)
Chloride: 104 mmol/L (ref 101–111)
Creatinine, Ser: 0.86 mg/dL (ref 0.44–1.00)
GFR calc Af Amer: >60 mL/min (ref >60)
GFR calc non Af Amer: >60 mL/min (ref >60)
GLUCOSE: 268 mg/dL — AB (ref 65–99)
POTASSIUM: 3.8 mmol/L (ref 3.5–5.1)
SODIUM: 136 mmol/L (ref 135–145)
TOTAL PROTEIN: 6.7 g/dL (ref 6.5–8.1)

## 2017-07-08 LAB — PROTIME-INR
INR: 1.02
Prothrombin Time: 13.3 seconds (ref 11.4–15.2)

## 2017-07-08 LAB — CBG MONITORING, ED: Glucose-Capillary: 271 mg/dL — ABNORMAL HIGH (ref 65–99)

## 2017-07-08 LAB — I-STAT TROPONIN, ED: Troponin i, poc: 0.01 ng/mL (ref 0.00–0.08)

## 2017-07-08 LAB — I-STAT BETA HCG BLOOD, ED (MC, WL, AP ONLY): I-stat hCG, quantitative: <5 m[IU]/mL (ref <5)

## 2017-07-08 LAB — TROPONIN I: Troponin I: <0.03 ng/mL (ref <0.03)

## 2017-07-08 LAB — MAGNESIUM: Magnesium: 1.6 mg/dL — ABNORMAL LOW (ref 1.7–2.4)

## 2017-07-08 MED ORDER — ASPIRIN 81 MG PO CHEW
324.0000 mg | CHEWABLE_TABLET | Freq: Once | ORAL | Status: AC
  Administered 2017-07-08: 324 mg via ORAL
  Filled 2017-07-08: qty 4

## 2017-07-08 NOTE — Discharge Instructions (Signed)
Stop taking your spironolactone for 2 weeks and make sure to drink plenty of water.  Please follow-up with your cardiologist as directed.  Please return to the emergency department if you develop any new or worsening symptoms.

## 2017-07-08 NOTE — ED Provider Notes (Signed)
[facility] EMERGENCY DEPARTMENT Provider Note   CSN: [id number] Arrival date & time: 07/08/17  0932     History   Chief Complaint Chief Complaint  Patient presents with  . Chest Pain    HPI [name] is a 36 y.o. female.  HPI  Patient presents after episodic palpitations and left-sided neck pain. Patient has multiple medical issues includingpossible SVTand is currently wearing an external heart monitor. She notes that today,soon after arriving at work she felt particularly exhausted, with palpitations, and had diaphoresis with left-sided neck discomfort. She notes that her heart rate was in the 140/180 range and persistently so until soon after EMS arrived. EMS reports the patient had mild tachycardia, 100/120, with sinus rhythm. Patient states that she feels better now than she did earlier today, but has persistent generalized weakness, fatigue. Patient has a notable history of prior cardiac evon including catheterization, echocardiogram, stress test, all Reportedly normal.  Past Medical History:  Diagnosis Date  . Abscess of left axilla - PREVOTELLA BIVIA & Staph Coag Neg 07/12/2012   MODERATE PREVOTELLA BIVIA Note: BETA LACTAMASE NEGATIVE    . Asthma    as a child  . Cancer ([place])    skin tag rt breast  . Cellulitis 06/01/2014   RT INNER THIGH  . Depression    hx  . Diabetes ([place])   . Diabetes mellitus    IDDM, Insulin pump; followed by Dr. [name]  . GERD (gastroesophageal reflux disease)    no current meds.  . Heart murmur   . Hidradenitis 04/2012   bilat. thighs, left groin - open areas on thighs  . Hx MRSA infection   . Hydradenitis   . Migraine   . PCOS (polycystic ovarian syndrome)   . Pneumonia    hx  . Vitamin D insufficiency     Patient Active Problem List   Diagnosis Date Noted  . Paroxysmal SVT (supraventricular tachycardia) ([place]) 06/24/2017  . Dizziness 06/24/2017  . Weakness 06/24/2017  . Abscess 03/17/2016    . Chest discomfort 07/10/2014  . Cellulitis 06/01/2014  . Cellulitis of right thigh 06/01/2014  . Postop check 01/19/2014  . Smoking addiction 08/29/2013  . DKA, type 1 ([place]) 05/09/2013  . Hidradenitis suppurativa 04/21/2012  . Esophageal reflux 11/17/2011  . Seasonal and perennial allergic rhinitis 05/20/2010  . BRONCHITIS, ACUTE 11/29/2007  . HYPONATREMIA 11/14/2007  . ANXIETY STATE, UNSPECIFIED 10/24/2007  . Sinus tachycardia 07/07/2007  . Type 1 diabetes mellitus ([place]) 04/08/2007  . SMOKER 04/08/2007  . DEPRESSION 04/08/2007  . 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 MIGRAINES, HX OF 04/08/2007    Past Surgical History:  Procedure Laterality Date  . BREAST SURGERY     right lumpectomy  . BUNIONECTOMY     left  . CARDIAC CATHETERIZATION    . CHOLECYSTECTOMY  12/02/2006   lap. chole.  Marland Kitchen DILATION AND EVACUATION  02/14/2007  . ESOPHAGOGASTRODUODENOSCOPY  11/17/2011   Procedure: ESOPHAGOGASTRODUODENOSCOPY (EGD);  Surgeon: [name], MD;  Location: [facility];  Service: Endoscopy;  Laterality: N/A;  . EYE SURGERY     exc. stye left eye  . HYDRADENITIS EXCISION  04/30/2012   Procedure: EXCISION HYDRADENITIS GROIN;  Surgeon: [name], MD;  Location: [facility];  Service: General;  Laterality: Left;  wide excision hidradenitis bilateral thighs and Left groin  . HYDRADENITIS EXCISION Left 01/13/2014   Procedure: WIDE EXCISION HIDRADENITIS LEFT AXILLA;  Surgeon: [name], MD;  Location: [facility];  Service: General;  Laterality: Left;  . IRRIGATION AND DEBRIDEMENT ABSCESS Right 06/02/2014   Procedure: IRRIGATION AND DEBRIDEMENT ABSCESS;  Surgeon: [name], MD;  Location: [facility];  Service: General;  Laterality: Right;  . IRRIGATION AND DEBRIDEMENT ABSCESS Left 09/23/2014   Procedure: IRRIGATION AND DEBRIDEMENT ABSCESS/LEFT THIGH;  Surgeon: [name], MD;  Location: [facility];  Service: General;  Laterality: Left;  . LEFT HEART  CATHETERIZATION WITH CORONARY ANGIOGRAM N/A 07/14/2014   Procedure: LEFT HEART CATHETERIZATION WITH CORONARY ANGIOGRAM;  Surgeon: [name], MD;  Location: [facility] C[facility] LAB;  Service: Cardiovascular;  Laterality: N/A;  . TONSILLECTOMY    . WISDOM TOOTH EXTRACTION      OB History    Gravida Para Term Preterm AB Living   4       3 1    SAB TAB Ectopic Multiple Live Births   3               Home Medications    Prior to Admission medications   Medication Sig Start Date End Date Taking? Authorizing Provider  acetaminophen (TYLENOL) 500 MG tablet Take 500 mg by mouth.    [provider]  amoxicillin (AMOXIL) 500 MG tablet Take 1 tablet (500 mg total) by mouth 2 (two) times daily. 06/29/17   Deneise Lever, MD  doxycycline (VIBRA-TABS) 100 MG tablet Take 1 tablet (100 mg total) by mouth 2 (two) times daily. Patient taking differently: Take 100 mg by mouth 2 (two) times daily as needed (flare up of hidradenitis).  03/19/16   Debbe Odea, MD  finasteride (PROSCAR) 5 MG tablet Take 5 mg by mouth daily.    [provider]  glucose blood ([employer] TEST STRIPS) test strip 1 strip by Misc.(Non-Drug; Combo Route) route 4 times daily with meals and nightly. contour next glucose test strips 08/04/16   [provider]  insulin glargine (LANTUS) 100 UNIT/ML injection Inject 50 Units into the skin at bedtime as needed (blod sugar). Reported on 03/21/2016 05/10/13   Charlynne Cousins, MD  Insulin Human (INSULIN PUMP) SOLN Inject into the skin continuous. *Novolog*    [provider]  levonorgestrel (MIRENA) 20 MCG/24HR IUD 1 each by Intrauterine route once. Placed 04/2013    [provider]  pantoprazole (PROTONIX) 40 MG tablet Take 1 tablet (40 mg total) by mouth daily. 02/03/17   Baird Lyons D, MD  spironolactone (ALDACTONE) 100 MG tablet Take 1 tablet (100 mg total) by mouth daily. 04/13/17   Deneise Lever, MD  VIIBRYD 40 MG TABS TAKE 1 TABLET BY MOUTH  ONCE DAILY 06/08/17   Deneise Lever, MD    Family History Family History  Problem Relation Age of Onset  . Hyperlipidemia Sister   . Hypertension Sister   . Hypertension Father     Social History Social History  Substance Use Topics  . Smoking status: Current Every Day Smoker    Packs/day: 0.50    Years: 14.00    Types: Cigarettes  . Smokeless tobacco: Never Used  . Alcohol use No     Allergies   Latex; Levofloxacin; Moxifloxacin; Oxycodone-acetaminophen; Peach [prunus persica]; Potassium-containing compounds; Prednisone; Propoxyphene n-acetaminophen; Rosiglitazone maleate; Xolair [omalizumab]; Adhesive [tape]; Morphine and related; Prozac [fluoxetine hcl]; Chantix [varenicline]; Citrullus vulgaris; Clindamycin/lincomycin; Humira [adalimumab]; Septra [sulfamethoxazole-trimethoprim]; Cefaclor; Keflex [cephalexin]; Promethazine hcl; and Sulfadiazine   Review of Systems Review of Systems  Constitutional:       Per HPI, otherwise negative  HENT:  Per HPI, otherwise negative  Respiratory:       Per HPI, otherwise negative  Cardiovascular:       Per HPI, otherwise negative  Gastrointestinal: Negative for vomiting.  Endocrine:       Negative aside from HPI  Genitourinary:       Neg aside from HPI   Musculoskeletal:       Per HPI, otherwise negative  Skin: Negative.   Neurological: Negative for syncope.     Physical Exam Updated Vital Signs BP 102/77   Pulse 85   Temp 98.1 F (36.7 C) (Oral)   Resp 19   Ht 5\' 5"  (1.651 m)   Wt 108.9 kg (240 lb)   SpO2 100%   BMI 39.94 kg/m   Physical Exam  Constitutional: She is oriented to person, place, and time. She appears well-developed and well-nourished. No distress.  HENT:  Head: Normocephalic and atraumatic.  Eyes: Conjunctivae and EOM are normal.  Cardiovascular: Regular rhythm.  Tachycardia present.   Pulmonary/Chest: Effort normal and breath sounds normal. No stridor. No respiratory distress.     Abdominal: She exhibits no distension.  Musculoskeletal: She exhibits no edema.  Neurological: She is alert and oriented to person, place, and time. No cranial nerve deficit.  Skin: Skin is warm and dry.  Psychiatric: She has a normal mood and affect.  Nursing note and vitals reviewed.    ED Treatments / Results  Labs (all labs ordered are listed, but only abnormal results are displayed) Labs Reviewed  COMPREHENSIVE METABOLIC PANEL - Abnormal; Notable for the following:       Result Value   CO2 20 (*)    Glucose, Bld 268 (*)    ALT 13 (*)    All other components within normal limits  MAGNESIUM - Abnormal; Notable for the following:    Magnesium 1.6 (*)    All other components within normal limits  CBC WITH DIFFERENTIAL/PLATELET - Abnormal; Notable for the following:    Hemoglobin 15.7 (*)    All other components within normal limits  CBG MONITORING, ED - Abnormal; Notable for the following:    Glucose-Capillary 271 (*)    All other components within normal limits  TROPONIN I  BRAIN NATRIURETIC PEPTIDE  PROTIME-INR  I-STAT BETA HCG BLOOD, ED (MC, WL, AP ONLY)  I-STAT TROPONIN, ED    EKG  EKG Interpretation  Date/Time:  Wednesday July 08 2017 09:44:44 EDT Ventricular Rate:  83 PR Interval:    QRS Duration: 86 QT Interval:  357 QTC Calculation: 420 R Axis:   66 Text Interpretation:  Sinus rhythm T wave abnormality No significant change since last tracing Borderline ECG Confirmed by Carmin Muskrat [phone]) on 07/08/2017 10:30:47 AM       Radiology Dg Chest 2 View  Result Date: 07/08/2017 CLINICAL DATA:  Chest pain radiating to arm. EXAM: CHEST  2 VIEW COMPARISON:  06/22/2017 FINDINGS: Heart and mediastinal contours are within normal limits. No focal opacities or effusions. No acute bony abnormality. IMPRESSION: No active cardiopulmonary disease. Electronically Signed   By: Rolm Baptise M.D.   On: 07/08/2017 10:31    Procedures Procedures (including  critical care time)  Medications Ordered in ED Medications  aspirin chewable tablet 324 mg (324 mg Oral Given 07/08/17 3016)     Initial Impression / Assessment and Plan / ED Course  I have reviewed the triage vital signs and the nursing notes.  Pertinent labs & imaging results that were available during my care of  the patient were reviewed by me and considered in my medical decision making (see chart for details).    After the initial evaluation I discussed patient's case with our cardiology colleagues for assistance with evaluation of her heart monitor to ascertain if there was true arrhythmia during her symptoms.  12:03 PM Initial labs reassuring.  Rhythm strips obtained afterusing assistance to anterior the patient's externalmonitor show multiple episodes of tachycardia, minimal atrial activity appreciable, regular narrow QRS and the rate of 150 range.   5:08 PM Cardiology seeing the patient, dispo pending but w reassuring labs, no e/o sustained dangerous arrhythmia on monitor and improved HR, d/c w close outpatient f/u is anticipated. Consult eval to be followed by Dr. Jeneen Rinks. Final Clinical Impressions(s) / ED Diagnoses  Palpitations Atypical chest pain   Carmin Muskrat, MD 07/09/17 [phone]

## 2017-07-08 NOTE — Telephone Encounter (Signed)
Spoke with pt and she states she could feel her heart rate jumping all over the place.  Walked from parking lot into work and HR was 144.  Checked it shortly after and it was 186.  Now it's [phone].  Pt very fatigued, having chest discomfort and tingling running all the way down her left arm.  Pt very concerned and anxious about how she is feeling.  Pt feels like she's going to pass out.  Advised pt to report to ER for eval.  Pt verbalized understanding and was in agreement with this plan.

## 2017-07-08 NOTE — Telephone Encounter (Signed)
[name] is calling with heart rate all over the place , Chest Discomfort , and tingling down her left arm , feel like she is going to pass out

## 2017-07-08 NOTE — Consult Note (Signed)
ELECTROPHYSIOLOGY CONSULT NOTE    Patient ID: [name] MRN: [id number], DOB/AGE: August 14, 1981 36 y.o.  Admit date: 07/08/2017 Date of Consult: 07/08/2017  Primary Physician: Harlan Stains, MD Primary Cardiologist: Nahser  Patient Profile: [name] is a 36 y.o. female with a history of palpitations who is being seen today for the evaluation of tachycardia/pre-syncope at the request of Dr Vanita Panda.  HPI:  [name] is a 36 y.o. female who started having palpitations and tachycardia in 2015.  She underwent extensive work up at that time including echo and catheterization which were reassuring.  Earlier this month, she again developed palpitations associated with diaphoresis, pre-syncope.  She was seen by Lyda Jester, PA and had event monitor placed.  This morning, she went into work and had another episode of palpitations and pre-syncope.  She remained dizzy and wiped out and therefore presented to the ER for further evaluation.  On telemetry, she has maintained SR without arrhythmias.  BP has been stable.  Event monitor strips from this morning show sinus tachycardia with no other arrhythmias identified.  She has shower intolerance and is on Spironolactone at home.  Her episodes typically occur while standing.  She feels "washed out" the rest of the day after one of her episodes. Her mom, who is in the room, feels that she has been under increased stress lately.    Past Medical History:  Diagnosis Date  . Abscess of left axilla - PREVOTELLA BIVIA & Staph Coag Neg 07/12/2012   MODERATE PREVOTELLA BIVIA Note: BETA LACTAMASE NEGATIVE    . Asthma    as a child  . Cancer ([place])    skin tag rt breast  . Cellulitis 06/01/2014   RT INNER THIGH  . Depression    hx  . Diabetes ([place])   . Diabetes mellitus    IDDM, Insulin pump; followed by Dr. [name]  . GERD (gastroesophageal reflux disease)    no current meds.  . Heart murmur   . Hidradenitis 04/2012   bilat.  thighs, left groin - open areas on thighs  . Hx MRSA infection   . Hydradenitis   . Migraine   . PCOS (polycystic ovarian syndrome)   . Pneumonia    hx  . Vitamin D insufficiency      Surgical History:  Past Surgical History:  Procedure Laterality Date  . BREAST SURGERY     right lumpectomy  . BUNIONECTOMY     left  . CARDIAC CATHETERIZATION    . CHOLECYSTECTOMY  12/02/2006   lap. chole.  Marland Kitchen DILATION AND EVACUATION  02/14/2007  . ESOPHAGOGASTRODUODENOSCOPY  11/17/2011   Procedure: ESOPHAGOGASTRODUODENOSCOPY (EGD);  Surgeon: [name], MD;  Location: [facility];  Service: Endoscopy;  Laterality: N/A;  . EYE SURGERY     exc. stye left eye  . HYDRADENITIS EXCISION  04/30/2012   Procedure: EXCISION HYDRADENITIS GROIN;  Surgeon: [name], MD;  Location: [facility];  Service: General;  Laterality: Left;  wide excision hidradenitis bilateral thighs and Left groin  . HYDRADENITIS EXCISION Left 01/13/2014   Procedure: WIDE EXCISION HIDRADENITIS LEFT AXILLA;  Surgeon: [name], MD;  Location: [facility];  Service: General;  Laterality: Left;  . IRRIGATION AND DEBRIDEMENT ABSCESS Right 06/02/2014   Procedure: IRRIGATION AND DEBRIDEMENT ABSCESS;  Surgeon: [name], MD;  Location: [facility];  Service: General;  Laterality: Right;  . IRRIGATION AND DEBRIDEMENT ABSCESS Left 09/23/2014   Procedure: IRRIGATION AND DEBRIDEMENT ABSCESS/LEFT THIGH;  Surgeon: [name], MD;  Location: [facility];  Service: General;  Laterality: Left;  . LEFT HEART CATHETERIZATION WITH CORONARY ANGIOGRAM N/A 07/14/2014   Procedure: LEFT HEART CATHETERIZATION WITH CORONARY ANGIOGRAM;  Surgeon: [name], MD;  Location: [facility];  Service: Cardiovascular;  Laterality: N/A;  . TONSILLECTOMY    . WISDOM TOOTH EXTRACTION        (Not in a hospital admission)  Inpatient Medications:   Allergies:  Allergies  Allergen Reactions  . Latex Hives, Shortness Of Breath and Rash    . Levofloxacin Shortness Of Breath and Rash  . Moxifloxacin Shortness Of Breath and Rash  . Oxycodone-Acetaminophen Shortness Of Breath, Swelling and Rash    NORCO/VICODIN OK  . Peach [Prunus Persica] Hives and Shortness Of Breath  . Potassium-Containing Compounds Other (See Comments)    IV ROUTE - CAUSES VEINS TO COLLAPS; Reports that it is undiluted K only  . Prednisone Other (See Comments)    SEVERE ELEVATION OF BLOOD SUGAR. Able to tolerate 40 mg  . Propoxyphene N-Acetaminophen Swelling    SWELLING OF FACE AND THROAT  . Rosiglitazone Maleate Swelling    SWELLING OF FACE AND LEGS  . Xolair [Omalizumab] Other (See Comments)    Rash and anaphylaxis  . Adhesive [Tape] Hives, Itching and Rash  . Morphine And Related Other (See Comments)    Causes hallucinations  . Prozac [Fluoxetine Hcl] Other (See Comments)    Made her very aggressive   . Chantix [Varenicline]     dreams  . Citrullus Vulgaris Nausea And Vomiting    Facial swelling  . Clindamycin/Lincomycin   . Humira [Adalimumab]     Does not remember   . Septra [Sulfamethoxazole-Trimethoprim]   . Cefaclor Rash  . Keflex [Cephalexin] Diarrhea and Rash    REACTION: severe migraine  . Promethazine Hcl Other (See Comments)    IV ROUTE ONLY - JITTERY FEELING. Patient reports that it is mild and she has used promethazine since then  PO tablet ok  . Sulfadiazine Rash    Social History   Social History  . Marital status: Single    Spouse name: N/A  . Number of children: N/A  . Years of education: N/A   Occupational History  . Not on file.   Social History Main Topics  . Smoking status: Current Every Day Smoker    Packs/day: 0.50    Years: 14.00    Types: Cigarettes  . Smokeless tobacco: Never Used  . Alcohol use No  . Drug use: No  . Sexual activity: Yes    Birth control/ protection: None   Other Topics Concern  . Not on file   Social History Narrative  . No narrative on file     Family History  Problem  Relation Age of Onset  . Hyperlipidemia Sister   . Hypertension Sister   . Hypertension Father      Review of Systems: All other systems reviewed and are otherwise negative except as noted above.  Physical Exam: Vitals:   07/08/17 1545 07/08/17 1600 07/08/17 1615 07/08/17 1630  BP: 112/75 106/77 116/80 110/73  Pulse: 82 81 92 95  Resp: (!) 22 17 (!) 22 (!) 38  Temp:      TempSrc:      SpO2: 98% 98% 97% 99%  Weight:      Height:        GEN- The patient is obese appearing, alert and oriented x 3 today.   HEENT: normocephalic, atraumatic;  sclera clear, conjunctiva pink; hearing intact; oropharynx clear; neck supple Lungs- normal work of breathing Heart- Regular rate and rhythm GI- soft, non-tender, non-distended, bowel sounds present Extremities- no clubbing, cyanosis, or edema MS- no significant deformity or atrophy Skin- warm and dry, no rash or lesion Psych- euthymic mood, full affect Neuro- strength and sensation are intact  Labs:  Lab Results  Component Value Date   WBC 8.4 07/08/2017   HGB 15.7 (H) 07/08/2017   HCT 44.4 07/08/2017   MCV 90.4 07/08/2017   PLT 327 07/08/2017    Recent Labs Lab 07/08/17 1000  NA 136  K 3.8  CL 104  CO2 20*  BUN 15  CREATININE 0.86  CALCIUM 9.0  PROT 6.7  BILITOT 0.8  ALKPHOS 84  ALT 13*  AST 15  GLUCOSE 268*      Radiology/Studies: Dg Chest 2 View  Result Date: 07/08/2017 CLINICAL DATA:  Chest pain radiating to arm. EXAM: CHEST  2 VIEW COMPARISON:  06/22/2017 FINDINGS: Heart and mediastinal contours are within normal limits. No focal opacities or effusions. No acute bony abnormality. IMPRESSION: No active cardiopulmonary disease. Electronically Signed   By: Rolm Baptise M.D.   On: 07/08/2017 10:31   Dg Chest 2 View  Result Date: 06/22/2017 CLINICAL DATA:  Cough and congestion EXAM: CHEST  2 VIEW COMPARISON:  April 09, 2017 FINDINGS: There is no edema or consolidation. The heart size and pulmonary vascularity are  normal. No adenopathy. No bone lesions. IMPRESSION: No edema or consolidation. Electronically Signed   By: Lowella Grip III M.D.   On: 06/22/2017 08:59    HYW:VPXTG rhythm(personally reviewed)  TELEMETRY: sinus tach/sinus rhythm (personally reviewed)  Assessment/Plan: 1.  Sinus tachycardia Likely 2/2 POTS/dehydration We have discussed lifestyle modifications today Dr Rayann Heman recommends discussing with MD at [facility] who prescribed Spironolactone and holding for 2 weeks to see if symptoms improve Continue wearing event monitor She has no SVT documented and no indication for EP procedures or follow up at this time Follow up with Dr Nahser/Brittainy as scheduled  [facility] to discharge from ER  Signed, Chanetta Marshall 07/08/2017 5:32 PM   I have seen, examined the patient, and reviewed the above assessment and plan.  Changes to above are made where necessary.  On exam, RRR.  Event monitor reveals only sinus tachycardia.  I do not see any episodes of SVT.  Given spironolactone, I worry about dehydration as an exacerbating factor.  I have advised that she hold her diuretic for 2 weeks and hydrate.  Follow-up with general cardiology.  OK to discharge.  No further EP workup planned.  Co Sign: Thompson Grayer, MD 07/08/2017 9:19 PM

## 2017-07-08 NOTE — ED Notes (Signed)
Gave Pt a [place] Sandwich and a Diet Sprite

## 2017-07-08 NOTE — ED Triage Notes (Signed)
Is wearing heart monitor x 2 weeks for SVT. HR 144 per patient. ST 120 when EMS arrived. CBG 265, is wearing insulin pump. She feels exhausted, like she ran a marathon. BP 100/60. Was able to ambulate, a X 4. Did report some jaw pain upon arrival to facility. During transport, in 100's now

## 2017-07-08 NOTE — ED Notes (Signed)
PT states understanding of care given, follow up care. PT ambulated from ED to car with a steady gait.  

## 2017-07-09 NOTE — Telephone Encounter (Signed)
Pt was evaluated in the ER.  Was seen by Dr. Rayann Heman and Benjaman Pott, NP  in consultation. Symptoms appear to be due to dehydration She is holding spironolactone for 2 weeks and see if her symptoms improve

## 2017-07-17 ENCOUNTER — Ambulatory Visit: Payer: 59 | Admitting: Internal Medicine

## 2017-07-20 ENCOUNTER — Ambulatory Visit: Payer: 59 | Admitting: Cardiology

## 2017-07-21 ENCOUNTER — Encounter: Payer: Self-pay | Admitting: Cardiology

## 2017-07-21 ENCOUNTER — Ambulatory Visit (INDEPENDENT_AMBULATORY_CARE_PROVIDER_SITE_OTHER): Payer: 59 | Admitting: Cardiology

## 2017-07-21 VITALS — BP 116/72 | HR 117 | Resp 16 | Ht 65.0 in | Wt 240.5 lb

## 2017-07-21 DIAGNOSIS — R0600 Dyspnea, unspecified: Secondary | ICD-10-CM

## 2017-07-21 DIAGNOSIS — R002 Palpitations: Secondary | ICD-10-CM

## 2017-07-21 NOTE — Progress Notes (Signed)
07/21/2017 [name]   03-12-1981  [id number]  Primary Physician Harlan Stains, MD Primary Cardiologist: Dr./ Nahser  Reason for Visit/CC: f/u for palpitations   HPI:  [name] is a 36 y.o. female, followed by Dr. Acie Fredrickson with a h/o palpitations/ tachycardia, DM, tobacco, normal cath 06/2014, normal EF by echo in 2015 who presents to clinic today for f/u given recent complaint of recurrent palpitations and near syncope.   Pt was evaluated by Dr. Cathie Olden in 2015 for palpitations and chest pain and had negative cardiac w/u. It was concluded that her palpitations/ sinus tach was related to hormonal changes (intermittent symptoms associated with periods). Conservative management recommended. Pt advised to f/u as needed.    She was seen in clinic on 06/25/17 with complaint of recurrent palpitations, elevated HR, SOB and near syncope. Pt also noted recent h/o sinus tach. Labs were obtained including D-dimer to r/o PE. D-dimer was negative. CBC, BMP and BNP also WNL. She was set up for 2 week outpatient monitor. On 07/08/17, she presented to the [facility] with CP and worsening symptoms. W/u was negative. Troponin normal. EP consulted. Pt seen by Dr. Rayann Heman. He reviewed monitor results which showed mild sinus tach, however no SVT and no other adverse arrhthymias. He felt that perhaps her spironolactone was contributing (? Dehydration). He advised that she hold this for several weeks to see if any improvement. He also advised that she increase fluid intake.   While of of spironolactone, pt had fluid retention and bilateral LEE and had to resume. While off her her diuretic, she noticed little improvement with HR. She continues to have an elevated resting HR but denies any recurrent near syncope.      Current Meds  Medication Sig  . acetaminophen (TYLENOL) 500 MG tablet Take 500 mg by mouth.  . doxycycline (VIBRA-TABS) 100 MG tablet Take 1 tablet (100 mg total) by mouth 2 (two) times daily.  (Patient taking differently: Take 100 mg by mouth 2 (two) times daily as needed (flare up of hidradenitis). )  . finasteride (PROSCAR) 5 MG tablet Take 5 mg by mouth daily.  Marland Kitchen glucose blood ([employer] TEST STRIPS) test strip 1 strip by Misc.(Non-Drug; Combo Route) route 4 times daily with meals and nightly. contour next glucose test strips  . insulin glargine (LANTUS) 100 UNIT/ML injection Inject 50 Units into the skin at bedtime as needed (blod sugar). Reported on 03/21/2016  . Insulin Human (INSULIN PUMP) SOLN Inject into the skin continuous. *Novolog*  . Insulin Human (INSULIN PUMP) SOLN Inject into the skin. humalog  . levonorgestrel (MIRENA) 20 MCG/24HR IUD 1 each by Intrauterine route once. Placed 04/2013  . pantoprazole (PROTONIX) 40 MG tablet Take 1 tablet (40 mg total) by mouth daily.  Marland Kitchen spironolactone (ALDACTONE) 100 MG tablet Take 1 tablet (100 mg total) by mouth daily.  Marland Kitchen VIIBRYD 40 MG TABS TAKE 1 TABLET BY MOUTH ONCE DAILY  . [DISCONTINUED] amoxicillin (AMOXIL) 500 MG tablet Take 1 tablet (500 mg total) by mouth 2 (two) times daily.   Allergies  Allergen Reactions  . Latex Hives, Shortness Of Breath and Rash  . Levofloxacin Shortness Of Breath and Rash  . Moxifloxacin Shortness Of Breath and Rash  . Oxycodone-Acetaminophen Shortness Of Breath, Swelling and Rash    NORCO/VICODIN OK  . Peach [Prunus Persica] Hives and Shortness Of Breath  . Potassium-Containing Compounds Other (See Comments)    IV ROUTE - CAUSES VEINS TO COLLAPS; Reports that it is undiluted K only  .  Prednisone Other (See Comments)    SEVERE ELEVATION OF BLOOD SUGAR. Able to tolerate 40 mg  . Propoxyphene N-Acetaminophen Swelling    SWELLING OF FACE AND THROAT  . Rosiglitazone Maleate Swelling    SWELLING OF FACE AND LEGS  . Xolair [Omalizumab] Other (See Comments)    Rash and anaphylaxis  . Adhesive [Tape] Hives, Itching and Rash  . Morphine And Related Other (See Comments)    Causes hallucinations  . Prozac  [Fluoxetine Hcl] Other (See Comments)    Made her very aggressive   . Chantix [Varenicline]     dreams  . Citrullus Vulgaris Nausea And Vomiting    Facial swelling  . Clindamycin/Lincomycin   . Humira [Adalimumab]     Does not remember   . Septra [Sulfamethoxazole-Trimethoprim]   . Cefaclor Rash  . Keflex [Cephalexin] Diarrhea and Rash    REACTION: severe migraine  . Promethazine Hcl Other (See Comments)    IV ROUTE ONLY - JITTERY FEELING. Patient reports that it is mild and she has used promethazine since then  PO tablet ok  . Sulfadiazine Rash   Past Medical History:  Diagnosis Date  . Abscess of left axilla - PREVOTELLA BIVIA & Staph Coag Neg 07/12/2012   MODERATE PREVOTELLA BIVIA Note: BETA LACTAMASE NEGATIVE    . Asthma    as a child  . Cancer ([place])    skin tag rt breast  . Cellulitis 06/01/2014   RT INNER THIGH  . Depression    hx  . Diabetes ([place])   . Diabetes mellitus    IDDM, Insulin pump; followed by Dr. [name]  . GERD (gastroesophageal reflux disease)    no current meds.  . Heart murmur   . Hidradenitis 04/2012   bilat. thighs, left groin - open areas on thighs  . Hx MRSA infection   . Hydradenitis   . Migraine   . PCOS (polycystic ovarian syndrome)   . Pneumonia    hx  . Vitamin D insufficiency    Family History  Problem Relation Age of Onset  . Hyperlipidemia Sister   . Hypertension Sister   . Hypertension Father    Past Surgical History:  Procedure Laterality Date  . BREAST SURGERY     right lumpectomy  . BUNIONECTOMY     left  . CARDIAC CATHETERIZATION    . CHOLECYSTECTOMY  12/02/2006   lap. chole.  Marland Kitchen DILATION AND EVACUATION  02/14/2007  . ESOPHAGOGASTRODUODENOSCOPY  11/17/2011   Procedure: ESOPHAGOGASTRODUODENOSCOPY (EGD);  Surgeon: [name], MD;  Location: [facility];  Service: Endoscopy;  Laterality: N/A;  . EYE SURGERY     exc. stye left eye  . HYDRADENITIS EXCISION  04/30/2012   Procedure: EXCISION HYDRADENITIS  GROIN;  Surgeon: [name], MD;  Location: [place];  Service: General;  Laterality: Left;  wide excision hidradenitis bilateral thighs and Left groin  . HYDRADENITIS EXCISION Left 01/13/2014   Procedure: WIDE EXCISION HIDRADENITIS LEFT AXILLA;  Surgeon: [name], MD;  Location: [place];  Service: General;  Laterality: Left;  . IRRIGATION AND DEBRIDEMENT ABSCESS Right 06/02/2014   Procedure: IRRIGATION AND DEBRIDEMENT ABSCESS;  Surgeon: [name], MD;  Location: [place];  Service: General;  Laterality: Right;  . IRRIGATION AND DEBRIDEMENT ABSCESS Left 09/23/2014   Procedure: IRRIGATION AND DEBRIDEMENT ABSCESS/LEFT THIGH;  Surgeon: [name], MD;  Location: [place];  Service: General;  Laterality: Left;  . LEFT HEART CATHETERIZATION WITH CORONARY ANGIOGRAM N/A 07/14/2014  Procedure: LEFT HEART CATHETERIZATION WITH CORONARY ANGIOGRAM;  Surgeon: [name], MD;  Location[facility];  Service: Cardiovascular;  Laterality: N/A;  . TONSILLECTOMY    . WISDOM TOOTH EXTRACTION     Social History   Social History  . Marital status: Single    Spouse name: N/A  . Number of children: N/A  . Years of education: N/A   Occupational History  . Not on file.   Social History Main Topics  . Smoking status: Current Every Day Smoker    Packs/day: 0.50    Years: 14.00    Types: Cigarettes  . Smokeless tobacco: Never Used  . Alcohol use No  . Drug use: No  . Sexual activity: Yes    Birth control/ protection: None   Other Topics Concern  . Not on file   Social History Narrative  . No narrative on file     Review of Systems: General: negative for chills, fever, night sweats or weight changes.  Cardiovascular: negative for chest pain, dyspnea on exertion, edema, orthopnea, palpitations, paroxysmal nocturnal dyspnea or shortness of breath Dermatological: negative for rash Respiratory: negative for cough or wheezing Urologic: negative for  hematuria Abdominal: negative for nausea, vomiting, diarrhea, bright red blood per rectum, melena, or hematemesis Neurologic: negative for visual changes, syncope, or dizziness All other systems reviewed and are otherwise negative except as noted above.   Physical Exam:  Blood pressure 116/72, pulse (!) 117, resp. rate 16, height 5\' 5"  (1.651 m), weight 240 lb 8 oz (109.1 kg), SpO2 98 %.  General appearance: alert, cooperative and no distress Neck: no carotid bruit and no JVD Lungs: clear to auscultation bilaterally Heart: regular rate and rhythm, S1, S2 normal, no murmur, click, rub or gallop Extremities: extremities normal, atraumatic, no cyanosis or edema Pulses: 2+ and symmetric Skin: Skin color, texture, turgor normal. No rashes or lesions Neurologic: Grossly normal  EKG not performed -- personally reviewed   ASSESSMENT AND PLAN:   1. Palpitations: recent labs unremarkable.  Monitor results reviewed by Dr. Acie Fredrickson and Dr. Rayann Heman. SR with no adverse arrhthymias. Pt also evaluated by Dr. Rayann Heman in the ED recently. He advised no further cardiac w/u. Pt encouraged to stay well hydrated and to avoid triggers. We discussed PRN use of BBs, however she has tried this before and did not tolerate given subsequent hypotension with associated use. She also plans to f/u with Dr. Lenna Gilford for sleep study to w/o OSA. I feel this is reasonable. F/u PRN.    Jackeline Gutknecht Ladoris Gene, MHS [facility] 07/21/2017 2:59 PM

## 2017-07-21 NOTE — Patient Instructions (Signed)
Medication Instructions:  Your physician recommends that you continue on your current medications as directed. Please refer to the Current Medication list given to you today.   -- If you need a refill on your cardiac medications before your next appointment, please call your pharmacy. -  Labwork: None ordered  Testing/Procedures: None ordered  Follow-Up: No follow up is needed at this time with Dr. Acie Fredrickson.  He will see you on an as needed basis.   Thank you for choosing [facility]!!

## 2017-08-03 ENCOUNTER — Other Ambulatory Visit: Payer: Self-pay | Admitting: *Deleted

## 2017-08-03 NOTE — Patient Outreach (Signed)
Spoke with Yoltzin by phone to advise her of transition of diabetes self management services from [facility] to [facility] in 2019. Ensured that she completed her health risk assessment at the [email or web address] website. Advised her she will be receiving a letter with details regarding the transition. Selin expressed appreciation for the assistance provided by this RNCM as her [facility] RNCM. Barrington Ellison RN,CCM,CDE [facility] Coordinator [facility] [employer] [phone] Office Fax [phone]

## 2017-08-10 ENCOUNTER — Telehealth: Payer: Self-pay | Admitting: Pulmonary Disease

## 2017-08-10 DIAGNOSIS — L732 Hidradenitis suppurativa: Secondary | ICD-10-CM | POA: Diagnosis not present

## 2017-08-10 NOTE — Telephone Encounter (Signed)
[name] presented w/ a flair in her hidradenitis- left groin inflamm;  She is [facility] [address];  They could not get remicade approved thru her insurance;  I agreed to call in AUGMENTIN 875mg  #20 one Bid to [place] to get her thru the holiday & she will f/u w/ her [facility] team ASAP... SMN

## 2017-08-27 ENCOUNTER — Ambulatory Visit (INDEPENDENT_AMBULATORY_CARE_PROVIDER_SITE_OTHER)
Admission: RE | Admit: 2017-08-27 | Discharge: 2017-08-27 | Disposition: A | Discharge status: Home / Self Care | Payer: 59 | Source: Ambulatory Visit | Attending: Internal Medicine | Admitting: Internal Medicine

## 2017-08-27 ENCOUNTER — Other Ambulatory Visit: Payer: 59

## 2017-08-27 ENCOUNTER — Telehealth: Payer: Self-pay | Admitting: Internal Medicine

## 2017-08-27 DIAGNOSIS — R059 Cough, unspecified: Secondary | ICD-10-CM

## 2017-08-27 DIAGNOSIS — M79662 Pain in left lower leg: Secondary | ICD-10-CM

## 2017-08-27 DIAGNOSIS — R05 Cough: Secondary | ICD-10-CM

## 2017-08-27 LAB — D-DIMER, QUANTITATIVE (NOT AT ARMC): D DIMER QUANT: 0.29 ug{FEU}/mL (ref <0.50)

## 2017-08-27 NOTE — Telephone Encounter (Signed)
Reports Left calf pain, cough hard- bile taste, soreness L anterior chest Baseline tachycardia, dry cough, no rub, no JVD P- CXR, D-dimer  Please order- CXR dx cough                        D-dimer (stat)  Dx calf pain

## 2017-08-27 NOTE — Telephone Encounter (Signed)
Tests ordered STAT as requested by CY Pt is aware Will sign off on message

## 2017-09-22 DIAGNOSIS — R079 Chest pain, unspecified: Secondary | ICD-10-CM | POA: Diagnosis not present

## 2017-09-22 DIAGNOSIS — Z5321 Procedure and treatment not carried out due to patient leaving prior to being seen by health care provider: Secondary | ICD-10-CM | POA: Diagnosis not present

## 2017-09-22 DIAGNOSIS — M79622 Pain in left upper arm: Secondary | ICD-10-CM | POA: Diagnosis not present

## 2017-09-22 DIAGNOSIS — Z8673 Personal history of transient ischemic attack (TIA), and cerebral infarction without residual deficits: Secondary | ICD-10-CM

## 2017-09-22 DIAGNOSIS — G459 Transient cerebral ischemic attack, unspecified: Secondary | ICD-10-CM

## 2017-09-22 HISTORY — DX: Transient cerebral ischemic attack, unspecified: G45.9

## 2017-09-22 HISTORY — DX: Personal history of transient ischemic attack (TIA), and cerebral infarction without residual deficits: Z86.73

## 2017-09-23 ENCOUNTER — Other Ambulatory Visit (INDEPENDENT_AMBULATORY_CARE_PROVIDER_SITE_OTHER): Payer: 59

## 2017-09-23 ENCOUNTER — Emergency Department (HOSPITAL_COMMUNITY)
Admission: EM | Admit: 2017-09-23 | Discharge: 2017-09-24 | Disposition: A | Discharge status: Home / Self Care | Payer: 59 | Attending: Emergency Medicine | Admitting: Emergency Medicine

## 2017-09-23 ENCOUNTER — Emergency Department (HOSPITAL_COMMUNITY): Payer: 59

## 2017-09-23 ENCOUNTER — Other Ambulatory Visit: Payer: Self-pay | Admitting: Internal Medicine

## 2017-09-23 ENCOUNTER — Encounter (HOSPITAL_COMMUNITY): Payer: Self-pay | Admitting: Emergency Medicine

## 2017-09-23 ENCOUNTER — Telehealth: Payer: Self-pay | Admitting: Cardiology

## 2017-09-23 DIAGNOSIS — Z79899 Other long term (current) drug therapy: Secondary | ICD-10-CM | POA: Insufficient documentation

## 2017-09-23 DIAGNOSIS — F17210 Nicotine dependence, cigarettes, uncomplicated: Secondary | ICD-10-CM | POA: Diagnosis not present

## 2017-09-23 DIAGNOSIS — R531 Weakness: Secondary | ICD-10-CM | POA: Diagnosis not present

## 2017-09-23 DIAGNOSIS — R51 Headache: Secondary | ICD-10-CM | POA: Diagnosis not present

## 2017-09-23 DIAGNOSIS — Z794 Long term (current) use of insulin: Secondary | ICD-10-CM | POA: Diagnosis not present

## 2017-09-23 DIAGNOSIS — R079 Chest pain, unspecified: Secondary | ICD-10-CM

## 2017-09-23 DIAGNOSIS — Z91040 Latex allergy status: Secondary | ICD-10-CM | POA: Diagnosis not present

## 2017-09-23 DIAGNOSIS — H53452 Other localized visual field defect, left eye: Secondary | ICD-10-CM | POA: Diagnosis not present

## 2017-09-23 DIAGNOSIS — R0789 Other chest pain: Secondary | ICD-10-CM | POA: Diagnosis not present

## 2017-09-23 DIAGNOSIS — H53459 Other localized visual field defect, unspecified eye: Secondary | ICD-10-CM | POA: Diagnosis not present

## 2017-09-23 DIAGNOSIS — R519 Headache, unspecified: Secondary | ICD-10-CM

## 2017-09-23 DIAGNOSIS — E109 Type 1 diabetes mellitus without complications: Secondary | ICD-10-CM | POA: Diagnosis not present

## 2017-09-23 DIAGNOSIS — H5340 Unspecified visual field defects: Secondary | ICD-10-CM

## 2017-09-23 DIAGNOSIS — R0602 Shortness of breath: Secondary | ICD-10-CM | POA: Diagnosis not present

## 2017-09-23 LAB — CBC
HEMATOCRIT: 41.8 % (ref 36.0–46.0)
Hemoglobin: 14.5 g/dL (ref 12.0–15.0)
MCH: 32 pg (ref 26.0–34.0)
MCHC: 34.7 g/dL (ref 30.0–36.0)
MCV: 92.3 fL (ref 78.0–100.0)
Platelets: 277 10*3/uL (ref 150–400)
RBC: 4.53 MIL/uL (ref 3.87–5.11)
RDW: 12.7 % (ref 11.5–15.5)
WBC: 8.8 10*3/uL (ref 4.0–10.5)

## 2017-09-23 LAB — BASIC METABOLIC PANEL
ANION GAP: 6 (ref 5–15)
BUN: 9 mg/dL (ref 6–23)
BUN: 13 mg/dL (ref 6–20)
CALCIUM: 9.1 mg/dL (ref 8.4–10.5)
CALCIUM: 8.9 mg/dL (ref 8.9–10.3)
CO2: 24 mEq/L (ref 19–32)
CO2: 23 mmol/L (ref 22–32)
CREATININE: 0.74 mg/dL (ref 0.44–1.00)
Chloride: 100 mEq/L (ref 96–112)
Chloride: 106 mmol/L (ref 101–111)
Creatinine, Ser: 0.78 mg/dL (ref 0.40–1.20)
GFR calc Af Amer: >60 mL/min (ref >60)
GFR: 88.8 mL/min (ref >60.00)
GLUCOSE: 296 mg/dL — AB (ref 70–99)
GLUCOSE: 235 mg/dL — AB (ref 65–99)
Potassium: 4.2 mEq/L (ref 3.5–5.1)
Potassium: 3.8 mmol/L (ref 3.5–5.1)
SODIUM: 133 meq/L — AB (ref 135–145)
Sodium: 135 mmol/L (ref 135–145)

## 2017-09-23 LAB — CBC WITH DIFFERENTIAL/PLATELET
BASOS ABS: 0.1 10*3/uL (ref 0.0–0.1)
Basophils Relative: 1.2 % (ref 0.0–3.0)
EOS ABS: 0.3 10*3/uL (ref 0.0–0.7)
Eosinophils Relative: 3.2 % (ref 0.0–5.0)
HCT: 45.1 % (ref 36.0–46.0)
Hemoglobin: 15.3 g/dL — ABNORMAL HIGH (ref 12.0–15.0)
LYMPHS ABS: 2.2 10*3/uL (ref 0.7–4.0)
Lymphocytes Relative: 27.8 % (ref 12.0–46.0)
MCHC: 33.9 g/dL (ref 30.0–36.0)
MCV: 94 fl (ref 78.0–100.0)
MONOS PCT: 6.5 % (ref 3.0–12.0)
Monocytes Absolute: 0.5 10*3/uL (ref 0.1–1.0)
NEUTROS ABS: 4.9 10*3/uL (ref 1.4–7.7)
NEUTROS PCT: 61.3 % (ref 43.0–77.0)
PLATELETS: 293 10*3/uL (ref 150.0–400.0)
RBC: 4.8 Mil/uL (ref 3.87–5.11)
RDW: 12.9 % (ref 11.5–15.5)
WBC: 7.9 10*3/uL (ref 4.0–10.5)

## 2017-09-23 LAB — I-STAT TROPONIN, ED
Troponin i, poc: 0 ng/mL (ref 0.00–0.08)
Troponin i, poc: 0 ng/mL (ref 0.00–0.08)

## 2017-09-23 LAB — HEPATIC FUNCTION PANEL
ALBUMIN: 4 g/dL (ref 3.5–5.2)
ALT: 12 U/L (ref 0–35)
AST: 10 U/L (ref 0–37)
Alkaline Phosphatase: 72 U/L (ref 39–117)
BILIRUBIN TOTAL: 0.5 mg/dL (ref 0.2–1.2)
Bilirubin, Direct: 0.1 mg/dL (ref 0.0–0.3)
Total Protein: 6.8 g/dL (ref 6.0–8.3)

## 2017-09-23 LAB — CARDIAC PANEL
CK MB: 1.3 ng/mL (ref 0.3–4.0)
CK TOTAL: 39 U/L (ref 7–177)
RELATIVE INDEX: 3.4 calc — AB (ref 0.0–2.5)

## 2017-09-23 LAB — TROPONIN I: TNIDX: 0 ug/L (ref 0.00–0.06)

## 2017-09-23 LAB — I-STAT BETA HCG BLOOD, ED (MC, WL, AP ONLY)

## 2017-09-23 MED ORDER — KETOROLAC TROMETHAMINE 30 MG/ML IJ SOLN
30.0000 mg | Freq: Once | INTRAMUSCULAR | Status: AC
  Administered 2017-09-23: 30 mg via INTRAVENOUS
  Filled 2017-09-23: qty 1

## 2017-09-23 MED ORDER — PROCHLORPERAZINE MALEATE 10 MG PO TABS
10.0000 mg | ORAL_TABLET | Freq: Once | ORAL | Status: AC
  Administered 2017-09-23: 10 mg via ORAL
  Filled 2017-09-23: qty 1

## 2017-09-23 MED ORDER — PROCHLORPERAZINE EDISYLATE 5 MG/ML IJ SOLN: 5.0000 mg | Freq: Once | INTRAMUSCULAR | Status: DC

## 2017-09-23 MED ORDER — SODIUM CHLORIDE 0.9 % IV BOLUS (SEPSIS)
1000.0000 mL | Freq: Once | INTRAVENOUS | Status: AC
  Administered 2017-09-23: 1000 mL via INTRAVENOUS

## 2017-09-23 MED ORDER — DIPHENHYDRAMINE HCL 50 MG/ML IJ SOLN
25.0000 mg | Freq: Once | INTRAMUSCULAR | Status: AC
  Administered 2017-09-23: 25 mg via INTRAVENOUS
  Filled 2017-09-23: qty 1

## 2017-09-23 NOTE — ED Triage Notes (Addendum)
Pt reports yesterday she had an episode of CP, SOB and sweating yesterday. Pt was seen at another ED for this, and was discharged. Pt called cardiology, who told her to come back for re-eval because she could not follow up in the next day. Pt now reports she has continuted numbness/weakness in her L arm since being seen at the ED yesterday. Pt also reports she has L eye blurred vision and HA that began last night. Pt's L arm is slightly decreased grip on L side. No arm drift. Symmetrical facial features.

## 2017-09-23 NOTE — ED Notes (Signed)
Pt was hard to obtain blood from, pt has hard veins to stick. Tech was able to get some blood and lab tubes drawn ans sent down:  [place]

## 2017-09-23 NOTE — Telephone Encounter (Signed)
Spoke with patient who states she had a very sharp pain in her chest at 1030 yesterday morning. She states she continued to have chest tightness and pressure radiating up left arm and into her upper abdomen and back. She states she had severe diaphoresis and nausea and the episode lasted 15-25 seconds. She states she was resting at the time it occurred; she went to the ED a few hours later. She states her ekg and d dimer were normal. She states she has a copy of her ekg and has given it to HIM at [facility] to be scanned into her chart. She states today she continues to have chest tightness, a headache on the right side of her head and visual changes. She states BP has been normal @ approximatley 100/78 mmHg, pulse not known. She has hx of SVT but did not feel like heart was racing during or before this episode. She works with Dr. Annamaria Boots and he ordered a cardiac panel this morning which he reported the results as normal. She states when she was driving today she was unable to remember where she was going. She states her left side feels weak and she has a harder time gripping with her left hand than with her right. Her speech is normal. I asked if she has contacted her PCP and she states she was told to go to their emergency clinic but that the wait time would be approximately 3 hours. I encouraged her to seek emergency medical attention for evaluation of CVA. I scheduled her to see Cecilie Kicks, NP at 0900 tomorrow and advised her to call back if the appointment is not needed. She verbalized understanding and agreement and thanked me for the call.

## 2017-09-23 NOTE — ED Notes (Signed)
Patient transported to MRI 

## 2017-09-23 NOTE — Telephone Encounter (Signed)
Agree with note by Christen Bame, RN.   She should be evaluated. The symptoms do not sound like an acute coronary syndrome

## 2017-09-23 NOTE — ED Provider Notes (Signed)
[facility] DEPT Provider Note   CSN: [id number] Arrival date & time: 09/23/17  1401     History   Chief Complaint Chief Complaint  Patient presents with  . Chest Pain  . Blurred Vision    HPI [name] is a 37 y.o. female who presents the emergency department with multiple complaints.  Patient states that yesterday at 10:30 in the morning she had sudden onset of left-sided chest pain and pressure with associated shortness of breath, radiating into her left neck and left arm.  She states that this lasted approximately 20 minutes.  Risk factors for ACS include type 1 diabetes, smoking, obesity and family history of early myocardial infarction.  Patient states that she went to [facility] ER however left AMA prior to finishing the workup.  She states that she still has some feeling of paresthesia and weakness on the left.  As she was driving home last night she began having some visual field deficit in the left lateral field that has resolved.  This morning she woke up with a right sided throbbing headache and feels like she has left-sided weakness.  She also has associated episodes of confusion including forgetting how to execute her job skills today at work and forgetting how to get to an office building today.  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 Keturah Barre.  HPI  Past Medical History:  Diagnosis Date  . Abscess of left axilla - PREVOTELLA BIVIA & Staph Coag Neg 07/12/2012   MODERATE PREVOTELLA BIVIA Note: BETA LACTAMASE NEGATIVE    . Asthma    as a child  . Cancer ([place])    skin tag rt breast  . Cellulitis 06/01/2014   RT INNER THIGH  . Depression    hx  . Diabetes ([place])   . Diabetes mellitus    IDDM, Insulin pump; followed by Dr. [name]  . GERD (gastroesophageal reflux disease)    no current meds.  . Heart murmur   . Hidradenitis 04/2012   bilat. thighs, left groin - open areas on thighs  .  Hx MRSA infection   . Hydradenitis   . Migraine   . PCOS (polycystic ovarian syndrome)   . Pneumonia    hx  . Vitamin D insufficiency     Patient Active Problem List   Diagnosis Date Noted  . Paroxysmal SVT (supraventricular tachycardia) ([place]) 06/24/2017  . Dizziness 06/24/2017  . Weakness 06/24/2017  . Abscess 03/17/2016  . Chest discomfort 07/10/2014  . Cellulitis 06/01/2014  . Cellulitis of right thigh 06/01/2014  . Postop check 01/19/2014  . Smoking addiction 08/29/2013  . DKA, type 1 ([place]) 05/09/2013  . Hidradenitis suppurativa 04/21/2012  . Esophageal reflux 11/17/2011  . Seasonal and perennial allergic rhinitis 05/20/2010  . BRONCHITIS, ACUTE 11/29/2007  . HYPONATREMIA 11/14/2007  . ANXIETY STATE, UNSPECIFIED 10/24/2007  . Tachycardia 07/07/2007  . Type 1 diabetes mellitus ([place]) 04/08/2007  . SMOKER 04/08/2007  . DEPRESSION 04/08/2007  . 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 MIGRAINES, HX OF 04/08/2007    Past Surgical History:  Procedure Laterality Date  . BREAST SURGERY     right lumpectomy  . BUNIONECTOMY     left  . CARDIAC CATHETERIZATION    . CHOLECYSTECTOMY  12/02/2006   lap. chole.  Marland Kitchen DILATION AND EVACUATION  02/14/2007  . ESOPHAGOGASTRODUODENOSCOPY  11/17/2011   Procedure: ESOPHAGOGASTRODUODENOSCOPY (EGD);  Surgeon: [name], MD;  Location: [facility];  Service:  Endoscopy;  Laterality: N/A;  . EYE SURGERY     exc. stye left eye  . HYDRADENITIS EXCISION  04/30/2012   Procedure: EXCISION HYDRADENITIS GROIN;  Surgeon: [name], MD;  Location: [facility];  Service: General;  Laterality: Left;  wide excision hidradenitis bilateral thighs and Left groin  . HYDRADENITIS EXCISION Left 01/13/2014   Procedure: WIDE EXCISION HIDRADENITIS LEFT AXILLA;  Surgeon: [name], MD;  Location: [facility];  Service: General;  Laterality: Left;  . IRRIGATION AND DEBRIDEMENT ABSCESS Right 06/02/2014   Procedure: IRRIGATION AND  DEBRIDEMENT ABSCESS;  Surgeon: [name], MD;  Location: [facility];  Service: General;  Laterality: Right;  . IRRIGATION AND DEBRIDEMENT ABSCESS Left 09/23/2014   Procedure: IRRIGATION AND DEBRIDEMENT ABSCESS/LEFT THIGH;  Surgeon: [name], MD;  Location: [facility];  Service: General;  Laterality: Left;  . LEFT HEART CATHETERIZATION WITH CORONARY ANGIOGRAM N/A 07/14/2014   Procedure: LEFT HEART CATHETERIZATION WITH CORONARY ANGIOGRAM;  Surgeon: [name], MD;  Location: [facility] C[facility] LAB;  Service: Cardiovascular;  Laterality: N/A;  . TONSILLECTOMY    . WISDOM TOOTH EXTRACTION      OB History    Gravida Para Term Preterm AB Living   4       3 1    SAB TAB Ectopic Multiple Live Births   3               Home Medications    Prior to Admission medications   Medication Sig Start Date End Date Taking? Authorizing Provider  doxycycline (VIBRA-TABS) 100 MG tablet Take 1 tablet (100 mg total) by mouth 2 (two) times daily. Patient taking differently: Take 100 mg by mouth 2 (two) times daily.  03/19/16  Yes Debbe Odea, MD  Insulin Human (INSULIN PUMP) SOLN Inject into the skin continuous. *Novolog*   Yes [provider]  levonorgestrel (MIRENA) 20 MCG/24HR IUD 1 each by Intrauterine route once. Placed 04/2013   Yes [provider]  pantoprazole (PROTONIX) 40 MG tablet Take 1 tablet (40 mg total) by mouth daily. 02/03/17  Yes Young, Tarri Fuller D, MD  spironolactone (ALDACTONE) 100 MG tablet Take 1 tablet (100 mg total) by mouth daily. 04/13/17  Yes Young, Tarri Fuller D, MD  VIIBRYD 40 MG TABS TAKE 1 TABLET BY MOUTH ONCE DAILY 06/08/17  Yes Baird Lyons D, MD  acetaminophen (TYLENOL) 500 MG tablet Take 500 mg by mouth every 6 (six) hours as needed for moderate pain.     [provider]  ALPRAZolam Duanne Moron) 0.25 MG tablet Take 0.25 mg by mouth 2 (two) times daily as needed for anxiety.  08/21/17   [provider]  finasteride (PROSCAR) 5 MG tablet Take 5 mg by mouth  daily.    [provider]  glucose blood ([employer] TEST STRIPS) test strip 1 strip by Misc.(Non-Drug; Combo Route) route 4 times daily with meals and nightly. contour next glucose test strips 08/04/16   [provider]  HUMALOG 100 UNIT/ML injection  07/31/17   [provider]  insulin glargine (LANTUS) 100 UNIT/ML injection Inject 50 Units into the skin at bedtime as needed (blod sugar). Reported on 03/21/2016 05/10/13   Charlynne Cousins, MD  Insulin Human (INSULIN PUMP) SOLN Inject into the skin. humalog    [provider]    Family History Family History  Problem Relation Age of Onset  . Hyperlipidemia Sister   . Hypertension Sister   . Hypertension Father     Social History Social  History   Tobacco Use  . Smoking status: Current Every Day Smoker    Packs/day: 0.50    Years: 14.00    Pack years: 7.00    Types: Cigarettes  . Smokeless tobacco: Never Used  Substance Use Topics  . Alcohol use: No    Alcohol/week: 0.0 oz  . Drug use: No     Allergies   Latex; Levofloxacin; Moxifloxacin; Oxycodone-acetaminophen; Peach [prunus persica]; Potassium-containing compounds; Prednisone; Propoxyphene n-acetaminophen; Rosiglitazone maleate; Xolair [omalizumab]; Adhesive [tape]; Morphine and related; Prozac [fluoxetine hcl]; Chantix [varenicline]; Citrullus vulgaris; Clindamycin/lincomycin; Humira [adalimumab]; Septra [sulfamethoxazole-trimethoprim]; Cefaclor; Keflex [cephalexin]; Promethazine hcl; and Sulfadiazine   Review of Systems Review of Systems  Ten systems reviewed and are negative for acute change, except as noted in the HPI.   Physical Exam Updated Vital Signs BP 110/82   Pulse 85   Temp 97.8 F (36.6 C) (Oral)   Resp 14   SpO2 98%   Physical Exam  Constitutional: She is oriented to person, place, and time. She appears well-developed and well-nourished. No distress.  HENT:  Head: Normocephalic and atraumatic.  Mouth/Throat:  Oropharynx is clear and moist.  Eyes: Conjunctivae and EOM are normal. Pupils are equal, round, and reactive to light. No scleral icterus.  No horizontal, vertical or rotational nystagmus  Neck: Normal range of motion. Neck supple.  Full active and passive ROM without pain No midline or paraspinal tenderness No nuchal rigidity or meningeal signs  Cardiovascular: Normal rate, regular rhythm and intact distal pulses.  Pulmonary/Chest: Effort normal and breath sounds normal. No respiratory distress. She has no wheezes. She has no rales.  Abdominal: Soft. Bowel sounds are normal. There is no tenderness. There is no rebound and no guarding.  Musculoskeletal: Normal range of motion.  Lymphadenopathy:    She has no cervical adenopathy.  Neurological: She is alert and oriented to person, place, and time. No cranial nerve deficit. She exhibits normal muscle tone. Coordination normal.  Mental Status:  Alert, oriented, thought content appropriate. Speech fluent without evidence of aphasia. Able to follow 2 step commands without difficulty.  Cranial Nerves:  II:  Peripheral visual fields grossly normal, pupils equal, round, reactive to light III,IV, VI: ptosis not present, extra-ocular motions intact bilaterally  V,VII: smile symmetric, facial light touch sensation equal VIII: hearing grossly normal bilaterally  IX,X: midline uvula rise  XI: bilateral shoulder shrug equal and strong XII: midline tongue extension  Motor:  5/5 in upper and lower extremities bilaterally including strong and equal grip strength and dorsiflexion/plantar flexion Sensory: Pinprick and light touch normal in all extremities.  Cerebellar: normal finger-to-nose with bilateral upper extremities Gait: normal gait and balance CV: distal pulses palpable throughout   Skin: Skin is warm and dry. No rash noted. She is not diaphoretic.  Psychiatric: She has a normal mood and affect. Her behavior is normal. Judgment and thought  content normal.  Nursing note and vitals reviewed.    ED Treatments / Results  Labs (all labs ordered are listed, but only abnormal results are displayed) Labs Reviewed  BASIC METABOLIC PANEL  CBC  I-STAT TROPONIN, ED  I-STAT BETA HCG BLOOD, ED ([facility], [facility], [facility] ONLY)    EKG  EKG Interpretation None       Radiology Dg Chest 2 View  Result Date: 09/23/2017 CLINICAL DATA:  Episode of chest pain, shortness of breath and diaphoresis yesterday. EXAM: CHEST  2 VIEW COMPARISON:  Radiographs 08/27/2017 and 07/08/2017. FINDINGS: The heart size and mediastinal contours are stable. Density at  the right cardiophrenic angle is stable, corresponding with prominent epicardial fat on prior CT. The lungs are clear. There is no pleural effusion or pneumothorax. No acute osseous findings. IMPRESSION: Stable chest.  No active cardiopulmonary process. Electronically Signed   By: Richardean Sale M.D.   On: 09/23/2017 15:17   Ct Head Wo Contrast  Result Date: 09/23/2017 CLINICAL DATA:  Chest pain, shortness of breath and sweating yesterday. Now with continued numbness and weakness in left arm. Patient also describes left eye blurry vision and headache. EXAM: CT HEAD WITHOUT CONTRAST TECHNIQUE: Contiguous axial images were obtained from the base of the skull through the vertex without intravenous contrast. COMPARISON:  Head CT dated 09/03/2016. FINDINGS: Brain: Ventricles are normal in size and configuration. There is no mass, hemorrhage, edema or other evidence of acute parenchymal abnormality. No extra-axial hemorrhage. Vascular: No hyperdense vessel or unexpected calcification. Skull: Normal. Negative for fracture or focal lesion. Sinuses/Orbits: No acute finding. Other: None. IMPRESSION: Normal head CT. No intracranial mass, hemorrhage or edema identified. Electronically Signed   By: Franki Cabot M.D.   On: 09/23/2017 18:19    Procedures Procedures (including critical care time)  Medications Ordered in  ED Medications  sodium chloride 0.9 % bolus 1,000 mL (not administered)  diphenhydrAMINE (BENADRYL) injection 25 mg (not administered)  ketorolac (TORADOL) 30 MG/ML injection 30 mg (not administered)  prochlorperazine (COMPAZINE) tablet 10 mg (not administered)     Initial Impression / Assessment and Plan / ED Course  I have reviewed the triage vital signs and the nursing notes.  Pertinent labs & imaging results that were available during my care of the patient were reviewed by me and considered in my medical decision making (see chart for details).  Clinical Course as of Sep 25 123  Wed Sep 23, 2017  2103 Patient's headache is resolved however she still has some throbbing on the right side of her head and states that she feels like she still has some difficulty seeing out of the left part of her eye although her visual fields are full to confrontation.  I still suspect that this is a complicated migraine headache however I will consult with a neurologist at this point.  Awaiting repeat troponin.  [AH]    Clinical Course User Index [AH] Margarita Mail, PA-C    Spoke with Dr. Cheral Marker who recommended MRI for the patient.  MRI tech was called in as they were on call.  Patient refusing MRI due to claustrophobia.  Also refusing medications to sedate her for MRI.  Patient will be discharged AMA.  Date: 09/24/2017 Patient: [name] Admitted: 09/23/2017  4:51 PM Attending Provider:Carling Liberman Kenton Kingfisher, PA-C [name] or her authorized caregiver has made the decision for the patient to leave the emergency department against the advice of [place] PA,Cfound.  She or her authorized caregiver has been informed and understands the inherent risks, including death.  She or her authorized caregiver has decided to accept the responsibility for this decision. [name] and all necessary parties have been advised that she may return for further evaluation or treatment. Her condition at time  of discharge was Good.  [name] had current vital signs as follows:  Blood pressure 118/81, pulse 80, temperature 97.8 F (36.6 C), temperature source Oral, resp. rate 15, SpO2 100 %.   [name] or her authorized caregiver has signed the Leaving Against Medical Advice form prior to leaving the department.  Margarita Mail 09/24/2017  Final Clinical Impressions(s) / ED Diagnoses   Final diagnoses:  Bad headache  Chest pain, unspecified type  Unilateral weakness  Visual field defect    ED Discharge Orders    None       Margarita Mail, PA-C 09/24/17 0127    Milton Ferguson, MD 09/24/17 2330

## 2017-09-23 NOTE — ED Notes (Addendum)
Patient returned from MRI. Patient unable to do MRI due to claustrophobia, EDPA made aware and is to come to bedside

## 2017-09-23 NOTE — Telephone Encounter (Signed)
New message  Patient went to ER yesterday, today complaining of headache and numbness on left side Please advise

## 2017-09-23 NOTE — ED Notes (Signed)
Bed: MA00 Expected date:  Expected time:  Means of arrival:  Comments: [place]

## 2017-09-23 NOTE — ED Notes (Signed)
IV attempted IV team consult put in

## 2017-09-24 ENCOUNTER — Encounter: Payer: Self-pay | Admitting: Neurology

## 2017-09-24 ENCOUNTER — Ambulatory Visit: Payer: 59 | Admitting: Cardiology

## 2017-09-24 MED ORDER — HYDROXYZINE HCL 25 MG PO TABS
25.0000 mg | ORAL_TABLET | Freq: Once | ORAL | Status: DC
  Filled 2017-09-24: qty 1

## 2017-09-24 MED ORDER — LORAZEPAM 2 MG/ML IJ SOLN
INTRAMUSCULAR | Status: AC
  Filled 2017-09-24: qty 1

## 2017-09-24 MED ORDER — LORAZEPAM 2 MG/ML IJ SOLN: 0.5000 mg | Freq: Once | INTRAMUSCULAR | Status: DC

## 2017-09-24 NOTE — Discharge Instructions (Signed)
Please return at any time to complete your work up.  You are having a headache. No specific cause was found today for your headache. It may have been a migraine or other cause of headache. Stress, anxiety, fatigue, and depression are common triggers for headaches. Your headache today does not appear to be life-threatening or require hospitalization, but often the exact cause of headaches is not determined in the emergency department. Therefore, follow-up with your doctor is very important to find out what may have caused your headache, and whether or not you need any further diagnostic testing or treatment. Sometimes headaches can appear benign (not harmful), but then more serious symptoms can develop which should prompt an immediate re-evaluation by your doctor or the emergency department. SEEK MEDICAL ATTENTION IF: You develop possible problems with medications prescribed.  The medications don't resolve your headache, if it recurs , or if you have multiple episodes of vomiting or can't take fluids. You have a change from the usual headache. RETURN IMMEDIATELY IF you develop a sudden, severe headache or confusion, become poorly responsive or faint, develop a fever above 100.22F or problem breathing, have a change in speech, vision, swallowing, or understanding, or develop new weakness, numbness, tingling, incoordination, or have a seizure.

## 2017-09-24 NOTE — ED Notes (Addendum)
Went in to medicate patient for MRI, patient refusing medications stating she does not believe that she can do the MRI even with medications. EDPA Abigail made aware.

## 2017-09-24 NOTE — Telephone Encounter (Signed)
Patient was seen in the ED yesterday and cancelled her appointment with our office today

## 2017-09-25 DIAGNOSIS — E103292 Type 1 diabetes mellitus with mild nonproliferative diabetic retinopathy without macular edema, left eye: Secondary | ICD-10-CM | POA: Diagnosis not present

## 2017-09-25 DIAGNOSIS — H52223 Regular astigmatism, bilateral: Secondary | ICD-10-CM | POA: Diagnosis not present

## 2017-09-25 DIAGNOSIS — H5213 Myopia, bilateral: Secondary | ICD-10-CM | POA: Diagnosis not present

## 2017-09-28 ENCOUNTER — Encounter: Payer: Self-pay | Admitting: Neurology

## 2017-09-28 ENCOUNTER — Ambulatory Visit: Payer: 59 | Admitting: Neurology

## 2017-09-28 VITALS — BP 110/77 | HR 103 | Ht 65.0 in | Wt 239.4 lb

## 2017-09-28 DIAGNOSIS — G459 Transient cerebral ischemic attack, unspecified: Secondary | ICD-10-CM

## 2017-09-28 DIAGNOSIS — R0789 Other chest pain: Secondary | ICD-10-CM

## 2017-09-28 DIAGNOSIS — F17200 Nicotine dependence, unspecified, uncomplicated: Secondary | ICD-10-CM | POA: Diagnosis not present

## 2017-09-28 DIAGNOSIS — E1059 Type 1 diabetes mellitus with other circulatory complications: Secondary | ICD-10-CM

## 2017-09-28 NOTE — Patient Instructions (Addendum)
-   recommend to take baby ASA for stroke prevention given some risk factors - will check A1C and fasting lipid - will check MRI and MRA brain for further evaluation - will check ultrasound for neck vessel and echocardiogram - continue follow up with Dr. Acie Fredrickson - relaxation and de-stress will help for the symptoms and sleep - Follow up with your primary care physician for stroke risk factor modification. Recommend maintain blood pressure goal <130/80, diabetes with hemoglobin A1c goal below 7.0% and lipids with LDL cholesterol goal below 70 mg/dL.  - follow up with eye doctors - check BP and glucose and record - follow up in 2-3 months with me

## 2017-09-28 NOTE — Progress Notes (Signed)
NEUROLOGY CLINIC NEW PATIENT NOTE  NAME: [name] DOB: Mar 13, 1981 REFERRING PHYSICIAN: Harlan Stains, MD  I saw [name] as a new consult in the neurovascular clinic today regarding  Chief Complaint  Patient presents with  . New Patient (Initial Visit)    09/22/2017 Pt was at [facility] ED  for chest pain,sob,, left side weakness at ED 09/23/2017  left side numbness blurred visionat [place], could not get MRI because she is claustophic, pt wanted a open MRI but it was not avvailable. Pt did see eye doctor on 09/26/2017 she does not have report  .  HPI: [name] is a 37 y.o. female with PMH of type 1 diabetes, SVT, light smoker, obesity who presents as a new patient for Left face and arm numbness. 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   Patient stated that she was at her usual health until  09/22/17 when she was sitting at home, had sudden onset chest pain, chest heaviness, radiating to left neck left jaw with left arm numbness.  Pressure at left head with left visual field blurry vision on the left eye. Went to [facility] in [place]. Cardiology consulted and rule out heart attack. She was discharged from ED. Second day, she has confusion in car, can not remember phone numbers, still has left face and arm numbness, with head pressure like achy pain, went to ED at [facility], given HA cocktail, but not effective. CT negative. Declined MRI due to claustrophobia. She was discharged from ED.   Over the last 5 days, she still has left facial and arm numbness but getting better. Intermittent left arm heaviness feeling with occasional dropping things. Still has left lower corner in the left eye cloudiness. Not able to have good sleep last 3 days due to anxiety. She went to see eye doctor last Friday 09/25/17 and was told to have some vessel damage in the eye. Pt sent over here for further eval.  Pt has hx of DM I, not sure about A1C but this am  glucose at home 277. No more SVT episodes. BP today `110/77. Light smoker only 1-2 cig per day. No illicit drugs.   Past Medical History:  Diagnosis Date  . Abscess of left axilla - PREVOTELLA BIVIA & Staph Coag Neg 07/12/2012   MODERATE PREVOTELLA BIVIA Note: BETA LACTAMASE NEGATIVE    . Asthma    as a child  . Cancer ([place])    skin tag rt breast  . Cellulitis 06/01/2014   RT INNER THIGH  . Depression    hx  . Diabetes ([place])   . Diabetes mellitus    IDDM, Insulin pump; followed by Dr. [name]  . GERD (gastroesophageal reflux disease)    no current meds.  . Heart murmur   . Hidradenitis 04/2012   bilat. thighs, left groin - open areas on thighs  . Hx MRSA infection   . Hydradenitis   . PCOS (polycystic ovarian syndrome)   . Pneumonia    hx  . SVT (supraventricular tachycardia) ([place]) 06/2017  . Vitamin D insufficiency    Past Surgical History:  Procedure Laterality Date  . BREAST SURGERY     right lumpectomy  . BUNIONECTOMY     left  . CARDIAC CATHETERIZATION    . CHOLECYSTECTOMY  12/02/2006   lap. chole.  Marland Kitchen DILATION AND EVACUATION  02/14/2007  . ESOPHAGOGASTRODUODENOSCOPY  11/17/2011   Procedure: ESOPHAGOGASTRODUODENOSCOPY (EGD);  Surgeon:  [name], MD;  Location: [facility];  Service: Endoscopy;  Laterality: N/A;  . EYE SURGERY     exc. stye left eye  . HYDRADENITIS EXCISION  04/30/2012   Procedure: EXCISION HYDRADENITIS GROIN;  Surgeon: [name], MD;  Location: [place];  Service: General;  Laterality: Left;  wide excision hidradenitis bilateral thighs and Left groin  . HYDRADENITIS EXCISION Left 01/13/2014   Procedure: WIDE EXCISION HIDRADENITIS LEFT AXILLA;  Surgeon: [name], MD;  Location: [place];  Service: General;  Laterality: Left;  . IRRIGATION AND DEBRIDEMENT ABSCESS Right 06/02/2014   Procedure: IRRIGATION AND DEBRIDEMENT ABSCESS;  Surgeon: [name], MD;  Location: [place];  Service: General;  Laterality:  Right;  . IRRIGATION AND DEBRIDEMENT ABSCESS Left 09/23/2014   Procedure: IRRIGATION AND DEBRIDEMENT ABSCESS/LEFT THIGH;  Surgeon: [name], MD;  Location: [place];  Service: General;  Laterality: Left;  . LEFT HEART CATHETERIZATION WITH CORONARY ANGIOGRAM N/A 07/14/2014   Procedure: LEFT HEART CATHETERIZATION WITH CORONARY ANGIOGRAM;  Surgeon: [name], MD;  Location: [facility] CATH LAB;  Service: Cardiovascular;  Laterality: N/A;  . TONSILLECTOMY    . WISDOM TOOTH EXTRACTION     Family History  Problem Relation Age of Onset  . Hyperlipidemia Sister   . Hypertension Sister   . Hypertension Father   . Stroke Paternal Uncle    Current Outpatient Medications  Medication Sig Dispense Refill  . ALPRAZolam (XANAX) 0.25 MG tablet Take 0.25 mg by mouth 2 (two) times daily as needed for anxiety.   2  . doxycycline (VIBRA-TABS) 100 MG tablet Take 1 tablet (100 mg total) by mouth 2 (two) times daily. (Patient taking differently: Take 100 mg by mouth 2 (two) times daily. ) 10 tablet 5  . glucose blood ([employer] TEST STRIPS) test strip 1 strip by Misc.(Non-Drug; Combo Route) route 4 times daily with meals and nightly. contour next glucose test strips    . HUMALOG 100 UNIT/ML injection     . insulin glargine (LANTUS) 100 UNIT/ML injection Inject 50 Units into the skin at bedtime as needed (blod sugar). Reported on 03/21/2016    . Insulin Human (INSULIN PUMP) SOLN Inject into the skin continuous. *Novolog*    . levonorgestrel (MIRENA) 20 MCG/24HR IUD 1 each by Intrauterine route once. Placed 04/2013    . pantoprazole (PROTONIX) 40 MG tablet Take 1 tablet (40 mg total) by mouth daily. 90 tablet 4  . spironolactone (ALDACTONE) 100 MG tablet Take 1 tablet (100 mg total) by mouth daily. 30 tablet 3  . VIIBRYD 40 MG TABS TAKE 1 TABLET BY MOUTH ONCE DAILY 30 tablet 6   No current facility-administered medications for this visit.    Allergies  Allergen Reactions  . Latex Hives, Shortness Of Breath and Rash    . Levofloxacin Shortness Of Breath and Rash  . Moxifloxacin Shortness Of Breath and Rash  . Oxycodone-Acetaminophen Shortness Of Breath, Swelling and Rash    NORCO/VICODIN OK  . Peach [Prunus Persica] Hives and Shortness Of Breath  . Potassium-Containing Compounds Other (See Comments)    IV ROUTE - CAUSES VEINS TO COLLAPS; Reports that it is undiluted K only  . Prednisone Other (See Comments)    SEVERE ELEVATION OF BLOOD SUGAR. Able to tolerate 40 mg  . Propoxyphene N-Acetaminophen Swelling    SWELLING OF FACE AND THROAT  . Rosiglitazone Maleate Swelling    SWELLING OF FACE AND LEGS  . Xolair [Omalizumab] Other (See Comments)  Rash and anaphylaxis  . Adhesive [Tape] Hives, Itching and Rash  . Morphine And Related Other (See Comments)    Causes hallucinations  . Prozac [Fluoxetine Hcl] Other (See Comments)    Made her very aggressive   . Chantix [Varenicline]     dreams  . Citrullus Vulgaris Nausea And Vomiting    Facial swelling  . Clindamycin/Lincomycin   . Humira [Adalimumab]     Does not remember   . Septra [Sulfamethoxazole-Trimethoprim]   . Cefaclor Rash  . Keflex [Cephalexin] Diarrhea and Rash    REACTION: severe migraine  . Promethazine Hcl Other (See Comments)    IV ROUTE ONLY - JITTERY FEELING. Patient reports that it is mild and she has used promethazine since then  PO tablet ok  . Sulfadiazine Rash   Social History   Socioeconomic History  . Marital status: Single    Spouse name: Not on file  . Number of children: Not on file  . Years of education: Not on file  . Highest education level: Not on file  Social Needs  . Financial resource strain: Not on file  . Food insecurity - worry: Not on file  . Food insecurity - inability: Not on file  . Transportation needs - medical: Not on file  . Transportation needs - non-medical: Not on file  Occupational History  . Not on file  Tobacco Use  . Smoking status: Current Every Day Smoker    Packs/day: 0.25     Years: 14.00    Pack years: 3.50    Types: Cigarettes  . Smokeless tobacco: Never Used  . Tobacco comment: 1 or two a day  Substance and Sexual Activity  . Alcohol use: No    Alcohol/week: 0.0 oz  . Drug use: No  . Sexual activity: Yes    Birth control/protection: None  Other Topics Concern  . Not on file  Social History Narrative  . Not on file    Review of Systems Full 14 system review of systems performed and notable only for those listed, all others are neg:  Constitutional:   Cardiovascular: Chest pain, palpitation, murmur, swelling in legs Ear/Nose/Throat:   Skin:  Eyes: Loss of vision Respiratory: SOB Gastroitestinal:   Genitourinary:  Hematology/Lymphatic:   Endocrine: [place] Musculoskeletal:   Allergy/Immunology:   Neurological: Confusion, head pressure, numbness, weakness, slurred speech, difficulty swallowing Psychiatric: Depression, decreased energy Sleep: Insomnia, sleepiness   Physical Exam  Vitals:   09/28/17 1041  BP: 110/77  Pulse: (!) 103    General - obese, well developed, in no apparent distress.  Ophthalmologic - Sharp disc margins OU, no papilledema or central retinal artery occlusion.  Cardiovascular - Regular rate and rhythm with no murmur. Carotid pulses were 2+ without bruits .   Neck - supple, no nuchal rigidity .  Mental Status -  Level of arousal and orientation to time, place, and person were intact. Language including expression, naming, repetition, comprehension, reading, and writing was assessed and found intact. Attention span and concentration were normal. Recent and remote memory were intact. Fund of Knowledge was assessed and was intact.  Cranial Nerves II - XII - II - Visual field intact OU. III, IV, VI - Extraocular movements intact. V - Facial sensation intact bilaterally. VII - Facial movement intact bilaterally. VIII - Hearing & vestibular intact bilaterally. X - Palate elevates symmetrically. XI - Chin  turning & shoulder shrug intact bilaterally. XII - Tongue protrusion intact.  Motor Strength - The patient's strength was  normal in all extremities and pronator drift was absent.  Bulk was normal and fasciculations were absent.   Motor Tone - Muscle tone was assessed at the neck and appendages and was normal.  Reflexes - The patient's reflexes were normal in all extremities and she had no pathological reflexes.  Sensory - Light touch, temperature/pinprick, vibration and proprioception, and Romberg testing were assessed and were normal.    Coordination - The patient had normal movements in the hands and feet with no ataxia or dysmetria.  Tremor was absent.  Gait and Station - The patient's transfers, posture, gait, station, and turns were observed as normal.   Imaging  I have personally reviewed the radiological images below and agree with the radiology interpretations.  CT head 09/23/17 Normal head CT. No intracranial mass, hemorrhage or edema Identified.  Cardiac event monitoring 07/13/17 Normal head CT. No intracranial mass, hemorrhage or edema identified.  Lab Review Component     Latest Ref Rng & Units 09/23/2017  CK-MB     0.3 - 4.0 ng/mL 1.3  Relative Index     0.0 - 2.5 calc 3.4 (H)  CK Total     7 - 177 U/L 39  TNIDX     0.00 - 0.06 ug/l 0.00      Assessment and Plan:   In summary, [name] is a 37 y.o. female with PMH of type 1 diabetes, SVT, light smoker, obesity who presents as a new patient for chest pain, with left face and arm numbness, head pressure, left visual field blurry vision on the left eye. Cardiology ruled out heart attack. CT head negative. MRI not done due to claustrophobia. Over time, still has left facial and arm numbness but getting better. Still has left lower corner in the left eye cloudiness. Not able to have good sleep last 3 days due to anxiety. Glucose not in good control at home. Symptoms less likely related to stroke but more likely  anxiety after CP. But given risk factors of DM, obesity, smoker, will complete stroke work up .    - recommend to take baby ASA for stroke prevention given some risk factors - will check stroke labs - will check MRI and MRA brain for further evaluation - will check CUS and TTE - continue follow up with Dr. Acie Fredrickson - relaxation and de-stress will help for the symptoms and sleep - Follow up with your primary care physician for stroke risk factor modification. Recommend maintain blood pressure goal <130/80, diabetes with hemoglobin A1c goal below 7.0% and lipids with LDL cholesterol goal below 70 mg/dL.  - follow up with ophthalmology - check BP and glucose and record - follow up in 3 months with me  Thank you very much for the opportunity to participate in the care of this patient.  Please do not hesitate to call if any questions or concerns arise.  Orders Placed This Encounter  Procedures  . MR BRAIN WO CONTRAST    Standing Status:   Future    Standing Expiration Date:   11/27/2018    Order Specific Question:   What is the patient's sedation requirement?    Answer:   No Sedation    Order Specific Question:   Does the patient have a pacemaker or implanted devices?    Answer:   No    Order Specific Question:   Preferred imaging location?    Answer:   Internal    Order Specific Question:   Radiology Contrast Protocol -  do NOT remove file path    Answer:   [email or web address]  . MR MRA HEAD WO CONTRAST    Standing Status:   Future    Standing Expiration Date:   11/27/2018    Order Specific Question:   What is the patient's sedation requirement?    Answer:   No Sedation    Order Specific Question:   Does the patient have a pacemaker or implanted devices?    Answer:   No    Order Specific Question:   Preferred imaging location?    Answer:   Internal    Order Specific Question:   Radiology Contrast Protocol - do NOT remove file path    Answer:    [email or web address]  . Lipid panel  . Hemoglobin A1c  . TSH + free T4  . Vitamin B12  . ECHOCARDIOGRAM COMPLETE    Standing Status:   Future    Standing Expiration Date:   12/28/2018    Order Specific Question:   Where should this test be performed    Answer:   [facility])    Order Specific Question:   Does the patient weigh less than or greater than 250 lbs?    Answer:   Patient weighs less than 250 lbs    Order Specific Question:   Complete or Limited study?    Answer:   Complete    Order Specific Question:   With Image Enhancing Agent or without Image Enhancing Agent?    Answer:   With Image Enhancing Agent    Order Specific Question:   Reason for exam-Echo    Answer:   TIA  435.9 / G45.9    No orders of the defined types were placed in this encounter.   Patient Instructions  - recommend to take baby ASA for stroke prevention given some risk factors - will check A1C and fasting lipid - will check MRI and MRA brain for further evaluation - will check ultrasound for neck vessel and echocardiogram - continue follow up with Dr. Acie Fredrickson - relaxation and de-stress will help for the symptoms and sleep - Follow up with your primary care physician for stroke risk factor modification. Recommend maintain blood pressure goal <130/80, diabetes with hemoglobin A1c goal below 7.0% and lipids with LDL cholesterol goal below 70 mg/dL.  - follow up with eye doctors - check BP and glucose and record - follow up in 2-3 months with me   Rosalin Hawking, MD PhD [facility] [address] [place], [place] [address] [phone]

## 2017-09-30 ENCOUNTER — Ambulatory Visit: Payer: 59

## 2017-09-30 DIAGNOSIS — G459 Transient cerebral ischemic attack, unspecified: Secondary | ICD-10-CM

## 2017-10-06 ENCOUNTER — Other Ambulatory Visit (HOSPITAL_COMMUNITY): Payer: 59

## 2017-10-07 ENCOUNTER — Telehealth: Payer: Self-pay

## 2017-10-07 NOTE — Telephone Encounter (Signed)
Notes recorded by Marval Regal, RN on 10/07/2017 at 10:08 AM EST Rn call patient about her MRI brain,and MRA head. The blood vessel test were both normal. No stroke, or brain tumor, or other structural lesion. Continue treatment plan. Pt verbalized understanding.

## 2017-10-07 NOTE — Telephone Encounter (Signed)
-----   Message from Rosalin Hawking, MD sent at 10/06/2017  3:35 PM EST ----- Could you please let the patient know that the MRI brain and MRA head vessel tests done recently in our office was both normal, no stroke or brain tumor or any other structural lesion. Please continue current treatment. Thanks.  Rosalin Hawking, MD PhD Stroke Neurology 10/06/2017 3:35 PM

## 2017-10-09 ENCOUNTER — Ambulatory Visit (HOSPITAL_COMMUNITY)
Admission: RE | Admit: 2017-10-09 | Discharge: 2017-10-09 | Disposition: A | Discharge status: Home / Self Care | Payer: 59 | Source: Ambulatory Visit | Attending: Neurology | Admitting: Neurology

## 2017-10-09 ENCOUNTER — Other Ambulatory Visit: Payer: Self-pay

## 2017-10-09 ENCOUNTER — Ambulatory Visit: Payer: 59 | Admitting: Neurology

## 2017-10-09 ENCOUNTER — Ambulatory Visit (HOSPITAL_BASED_OUTPATIENT_CLINIC_OR_DEPARTMENT_OTHER): Payer: 59

## 2017-10-09 DIAGNOSIS — R079 Chest pain, unspecified: Secondary | ICD-10-CM | POA: Diagnosis not present

## 2017-10-09 DIAGNOSIS — Z720 Tobacco use: Secondary | ICD-10-CM | POA: Insufficient documentation

## 2017-10-09 DIAGNOSIS — E119 Type 2 diabetes mellitus without complications: Secondary | ICD-10-CM | POA: Insufficient documentation

## 2017-10-09 DIAGNOSIS — E669 Obesity, unspecified: Secondary | ICD-10-CM | POA: Diagnosis not present

## 2017-10-09 DIAGNOSIS — G459 Transient cerebral ischemic attack, unspecified: Secondary | ICD-10-CM

## 2017-10-09 DIAGNOSIS — I6523 Occlusion and stenosis of bilateral carotid arteries: Secondary | ICD-10-CM | POA: Diagnosis not present

## 2017-10-09 DIAGNOSIS — Z6839 Body mass index (BMI) 39.0-39.9, adult: Secondary | ICD-10-CM | POA: Diagnosis not present

## 2017-10-09 LAB — ECHOCARDIOGRAM COMPLETE
Ao-asc: 26 cm
CHL CUP DOP CALC LVOT VTI: 15.4 cm
E decel time: 346 msec
E/e' ratio: 6.92
FS: 25 % — AB (ref 28–44)
IVS/LV PW RATIO, ED: 0.95
LA ID, A-P, ES: 28 mm
LA diam index: 1.31 cm/m2
LA vol index: 24.3 mL/m2
LAVOL: 52 mL
LAVOLA4C: 47 mL
LDCA: 3.14 cm2
LEFT ATRIUM END SYS DIAM: 28 mm
LV E/e' medial: 6.92
LV E/e'average: 6.92
LV PW d: 9.89 mm — AB (ref 0.6–1.1)
LV TDI E'MEDIAL: 9.16
LVELAT: 11.9 cm/s
LVOT peak grad rest: 3 mmHg
LVOTD: 20 mm
LVOTPV: 80.5 cm/s
LVOTSV: 48 mL
Lateral S' vel: 14.3 cm/s
MV Dec: 346
MV pk E vel: 82.4 m/s
MVPG: 3 mmHg
MVPKAVEL: 79 m/s
TDI e' lateral: 11.9

## 2017-10-09 MED ORDER — PERFLUTREN LIPID MICROSPHERE
1.0000 mL | INTRAVENOUS | Status: AC | PRN
  Administered 2017-10-09: 3 mL via INTRAVENOUS

## 2017-10-09 NOTE — Progress Notes (Signed)
*  PRELIMINARY RESULTS* Vascular Ultrasound Carotid Duplex (Doppler) has been completed.  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   Somewhat limited exam due to patient body habitus and penetration.   [name] 10/09/2017, 5:21 PM

## 2017-10-10 ENCOUNTER — Other Ambulatory Visit: Payer: Self-pay | Admitting: Neurology

## 2017-10-10 DIAGNOSIS — G459 Transient cerebral ischemic attack, unspecified: Secondary | ICD-10-CM

## 2017-10-10 NOTE — Progress Notes (Addendum)
I need to know whether she is pregnant or when her last menstrual peroid was before we order the CTA neck. Will need to call her first.   Rosalin Hawking, MD PhD Stroke Neurology 10/10/2017 6:45 PM   I called the pt and discussed with her over the phone. She stated that she has no chance to be pregnant as she has IUD placed and she has had no sex for 2 years. I also relayed the carotid doppler result to her and would like to have CTA neck done and she is in agreement. Will put in order.   Rosalin Hawking, MD PhD Stroke Neurology 10/12/2017 5:52 PM  Orders Placed This Encounter  Procedures  . CT ANGIO NECK W OR WO CONTRAST    Standing Status:   Future    Standing Expiration Date:   04/11/2018    Order Specific Question:   If indicated for the ordered procedure, I authorize the administration of contrast media per Radiology protocol    Answer:   Yes    Order Specific Question:   Is patient pregnant?    Answer:   No    Comments:   pt has IUD and has had no sex for 2 years    Order Specific Question:   Preferred imaging location?    Answer:   [facility]    Order Specific Question:   Radiology Contrast Protocol - do NOT remove file path    Answer:   [email or web address]

## 2017-10-12 NOTE — Addendum Note (Signed)
Addended by: Rosalin Hawking on: 10/12/2017 05:54 PM   Modules accepted: Orders

## 2017-10-13 ENCOUNTER — Telehealth: Payer: Self-pay

## 2017-10-13 DIAGNOSIS — L732 Hidradenitis suppurativa: Secondary | ICD-10-CM | POA: Diagnosis not present

## 2017-10-13 DIAGNOSIS — L089 Local infection of the skin and subcutaneous tissue, unspecified: Secondary | ICD-10-CM | POA: Diagnosis not present

## 2017-10-13 NOTE — Telephone Encounter (Signed)
-----   Message from Rosalin Hawking, MD sent at 10/09/2017  5:33 PM EST ----- Could you please let the patient know that the heart ultrasound test done recently was unremarkable. Please continue current treatment. Thanks.  Rosalin Hawking, MD PhD Stroke Neurology 10/09/2017 5:33 PM

## 2017-10-13 NOTE — Telephone Encounter (Signed)
Rn call patient about her vas carotid results. PT stated Dr. Erlinda Hong call her about the results. HE order at CTA neck. Order was sent to GI. PT was given number of [phone] to call. PT will call to schedule scan. Verbalized understanding. ------

## 2017-10-13 NOTE — Telephone Encounter (Signed)
RN call patient that heart ultrasound was unremarkable. Pt verbalized understanding. ------

## 2017-10-13 NOTE — Telephone Encounter (Signed)
-----   Message from Rosalin Hawking, MD sent at 10/10/2017  6:42 PM EST ----- Could you please let the patient know that the carotid artery ultrasound test done recently was showed possible 40-59% stenosis at both carotid arteries. However, this study is limited due to the vessel tortuosity which can cause false readings.will need to do a CTA of the neck to direct visualization of the vessels. Please let the pt know that I will order the test for her and the imaging place will call her for appointment. Thanks.  Rosalin Hawking, MD PhD Stroke Neurology 10/10/2017 6:42 PM

## 2017-10-13 NOTE — Progress Notes (Signed)
Per Dr.Xu he has already spoken with patient. See note below about last menstrual cycle.

## 2017-10-19 DIAGNOSIS — L732 Hidradenitis suppurativa: Secondary | ICD-10-CM | POA: Diagnosis not present

## 2017-10-19 DIAGNOSIS — Z5181 Encounter for therapeutic drug level monitoring: Secondary | ICD-10-CM | POA: Diagnosis not present

## 2017-10-20 ENCOUNTER — Other Ambulatory Visit: Payer: Self-pay | Admitting: Internal Medicine

## 2017-10-20 ENCOUNTER — Ambulatory Visit
Admission: RE | Admit: 2017-10-20 | Discharge: 2017-10-20 | Disposition: A | Discharge status: Home / Self Care | Payer: 59 | Source: Ambulatory Visit | Attending: Neurology | Admitting: Neurology

## 2017-10-20 ENCOUNTER — Other Ambulatory Visit (INDEPENDENT_AMBULATORY_CARE_PROVIDER_SITE_OTHER): Payer: 59

## 2017-10-20 DIAGNOSIS — L732 Hidradenitis suppurativa: Secondary | ICD-10-CM

## 2017-10-20 DIAGNOSIS — G459 Transient cerebral ischemic attack, unspecified: Secondary | ICD-10-CM | POA: Diagnosis not present

## 2017-10-20 LAB — HEPATIC FUNCTION PANEL
ALK PHOS: 85 U/L (ref 39–117)
ALT: 13 U/L (ref 0–35)
AST: 12 U/L (ref 0–37)
Albumin: 4.2 g/dL (ref 3.5–5.2)
BILIRUBIN DIRECT: 0 mg/dL (ref 0.0–0.3)
Total Bilirubin: 0.6 mg/dL (ref 0.2–1.2)
Total Protein: 7.4 g/dL (ref 6.0–8.3)

## 2017-10-20 MED ORDER — IOPAMIDOL (ISOVUE-370) INJECTION 76%
75.0000 mL | Freq: Once | INTRAVENOUS | Status: AC | PRN
  Administered 2017-10-20: 75 mL via INTRAVENOUS

## 2017-10-20 NOTE — Progress Notes (Signed)
Called spoke with patient, advised of LAB results / recs as stated by CY.  Pt verbalized her understanding and denied any questions.

## 2017-10-22 ENCOUNTER — Telehealth: Payer: Self-pay

## 2017-10-22 NOTE — Telephone Encounter (Signed)
Notes recorded by Marval Regal, RN on 10/22/2017 at 3:28 PM EST Left vm for patient to call back about Ct angio neck results. ------

## 2017-10-23 ENCOUNTER — Other Ambulatory Visit: Payer: 59

## 2017-10-23 DIAGNOSIS — L732 Hidradenitis suppurativa: Secondary | ICD-10-CM

## 2017-10-24 LAB — HEPATITIS B CORE ANTIBODY, IGM: Hep B C IgM: NONREACTIVE

## 2017-10-28 NOTE — Telephone Encounter (Signed)
Called and spoke with patient. She verbalized understanding of the results of her CT Angio neck as outline by Dr. Erlinda Hong: CT angio neck test done recently was normal carotid vessels in the neck as we expected. The ultrasound of neck can not calculate well if the vessel is torturous. So now we know the vessels are normal. Please continue current treatment.  She verbalized appreciation and will f/u in office on 01/05/18 @ 4:00, arrival time 3:30.

## 2017-10-29 DIAGNOSIS — L4050 Arthropathic psoriasis, unspecified: Secondary | ICD-10-CM | POA: Diagnosis not present

## 2017-10-29 DIAGNOSIS — E1065 Type 1 diabetes mellitus with hyperglycemia: Secondary | ICD-10-CM | POA: Diagnosis not present

## 2017-10-29 DIAGNOSIS — Z9641 Presence of insulin pump (external) (internal): Secondary | ICD-10-CM | POA: Diagnosis not present

## 2017-10-29 DIAGNOSIS — F840 Autistic disorder: Secondary | ICD-10-CM | POA: Diagnosis not present

## 2017-12-03 ENCOUNTER — Other Ambulatory Visit: Payer: Self-pay | Admitting: *Deleted

## 2017-12-03 DIAGNOSIS — R5383 Other fatigue: Secondary | ICD-10-CM

## 2017-12-04 ENCOUNTER — Other Ambulatory Visit: Payer: 59

## 2017-12-04 DIAGNOSIS — R5383 Other fatigue: Secondary | ICD-10-CM | POA: Diagnosis not present

## 2017-12-07 LAB — THYROID PEROXIDASE ANTIBODY: Thyroperoxidase Ab SerPl-aCnc: 1 IU/mL (ref <9)

## 2017-12-22 ENCOUNTER — Telehealth: Payer: Self-pay | Admitting: Internal Medicine

## 2017-12-22 ENCOUNTER — Other Ambulatory Visit: Payer: Self-pay | Admitting: Internal Medicine

## 2017-12-22 ENCOUNTER — Other Ambulatory Visit (INDEPENDENT_AMBULATORY_CARE_PROVIDER_SITE_OTHER): Payer: 59

## 2017-12-22 DIAGNOSIS — E1010 Type 1 diabetes mellitus with ketoacidosis without coma: Secondary | ICD-10-CM | POA: Diagnosis not present

## 2017-12-22 DIAGNOSIS — E108 Type 1 diabetes mellitus with unspecified complications: Secondary | ICD-10-CM | POA: Diagnosis not present

## 2017-12-22 LAB — BASIC METABOLIC PANEL
BUN: 13 mg/dL (ref 6–23)
CALCIUM: 9.7 mg/dL (ref 8.4–10.5)
CO2: 23 mEq/L (ref 19–32)
CREATININE: 0.9 mg/dL (ref 0.40–1.20)
Chloride: 96 mEq/L (ref 96–112)
GFR: 75.18 mL/min (ref >60.00)
GLUCOSE: 397 mg/dL — AB (ref 70–99)
Potassium: 4.2 mEq/L (ref 3.5–5.1)
Sodium: 134 mEq/L — ABNORMAL LOW (ref 135–145)

## 2017-12-22 LAB — CBC WITH DIFFERENTIAL/PLATELET
BASOS PCT: 1.3 % (ref 0.0–3.0)
Basophils Absolute: 0.1 10*3/uL (ref 0.0–0.1)
EOS PCT: 4.8 % (ref 0.0–5.0)
Eosinophils Absolute: 0.4 10*3/uL (ref 0.0–0.7)
HCT: 48 % — ABNORMAL HIGH (ref 36.0–46.0)
Hemoglobin: 16.2 g/dL — ABNORMAL HIGH (ref 12.0–15.0)
LYMPHS ABS: 2.9 10*3/uL (ref 0.7–4.0)
Lymphocytes Relative: 32.3 % (ref 12.0–46.0)
MCHC: 33.7 g/dL (ref 30.0–36.0)
MCV: 94.3 fl (ref 78.0–100.0)
MONO ABS: 0.6 10*3/uL (ref 0.1–1.0)
MONOS PCT: 6.6 % (ref 3.0–12.0)
NEUTROS PCT: 55 % (ref 43.0–77.0)
Neutro Abs: 4.9 10*3/uL (ref 1.4–7.7)
Platelets: 378 10*3/uL (ref 150.0–400.0)
RBC: 5.09 Mil/uL (ref 3.87–5.11)
RDW: 13 % (ref 11.5–15.5)
WBC: 8.8 10*3/uL (ref 4.0–10.5)

## 2017-12-22 LAB — URINALYSIS, ROUTINE W REFLEX MICROSCOPIC
Bilirubin Urine: NEGATIVE
Ketones, ur: 40 — AB
Leukocytes, UA: NEGATIVE
Nitrite: NEGATIVE
PH: 5.5 (ref 5.0–8.0)
SPECIFIC GRAVITY, URINE: 1.015 (ref 1.000–1.030)
UROBILINOGEN UA: 0.2 (ref 0.0–1.0)
Urine Glucose: >=1000 — AB
WBC, UA: NONE SEEN (ref 0–?)

## 2017-12-22 MED ORDER — ONDANSETRON HCL 4 MG PO TABS: ORAL_TABLET | ORAL | 5 refills | Status: DC

## 2017-12-22 NOTE — Progress Notes (Signed)
bm 

## 2017-12-22 NOTE — Telephone Encounter (Signed)
Concerned she may be going into DKA- malaise with N&V Plan- labs - BMET, /cbc w diff, U/A. Rx Zofran to prevent dehydration. She will contact her DM provider for further guidance as appropriate.

## 2017-12-24 ENCOUNTER — Emergency Department (HOSPITAL_COMMUNITY): Payer: 59

## 2017-12-24 ENCOUNTER — Encounter (HOSPITAL_COMMUNITY): Payer: Self-pay | Admitting: *Deleted

## 2017-12-24 ENCOUNTER — Other Ambulatory Visit: Payer: Self-pay

## 2017-12-24 ENCOUNTER — Observation Stay (HOSPITAL_COMMUNITY)
Admission: EM | Admit: 2017-12-24 | Discharge: 2017-12-27 | Disposition: A | Discharge status: Home / Self Care | Payer: 59 | Attending: Internal Medicine | Admitting: Internal Medicine

## 2017-12-24 DIAGNOSIS — R000 Tachycardia, unspecified: Secondary | ICD-10-CM

## 2017-12-24 DIAGNOSIS — R1013 Epigastric pain: Secondary | ICD-10-CM | POA: Diagnosis not present

## 2017-12-24 DIAGNOSIS — Z881 Allergy status to other antibiotic agents status: Secondary | ICD-10-CM | POA: Insufficient documentation

## 2017-12-24 DIAGNOSIS — R109 Unspecified abdominal pain: Secondary | ICD-10-CM | POA: Diagnosis not present

## 2017-12-24 DIAGNOSIS — R1110 Vomiting, unspecified: Secondary | ICD-10-CM | POA: Diagnosis not present

## 2017-12-24 DIAGNOSIS — F17210 Nicotine dependence, cigarettes, uncomplicated: Secondary | ICD-10-CM | POA: Diagnosis not present

## 2017-12-24 DIAGNOSIS — Z882 Allergy status to sulfonamides status: Secondary | ICD-10-CM | POA: Insufficient documentation

## 2017-12-24 DIAGNOSIS — Z888 Allergy status to other drugs, medicaments and biological substances status: Secondary | ICD-10-CM | POA: Insufficient documentation

## 2017-12-24 DIAGNOSIS — Z794 Long term (current) use of insulin: Secondary | ICD-10-CM | POA: Insufficient documentation

## 2017-12-24 DIAGNOSIS — I2510 Atherosclerotic heart disease of native coronary artery without angina pectoris: Secondary | ICD-10-CM | POA: Diagnosis not present

## 2017-12-24 DIAGNOSIS — R739 Hyperglycemia, unspecified: Secondary | ICD-10-CM | POA: Diagnosis not present

## 2017-12-24 DIAGNOSIS — R933 Abnormal findings on diagnostic imaging of other parts of digestive tract: Secondary | ICD-10-CM | POA: Diagnosis not present

## 2017-12-24 DIAGNOSIS — K219 Gastro-esophageal reflux disease without esophagitis: Secondary | ICD-10-CM | POA: Diagnosis not present

## 2017-12-24 DIAGNOSIS — Z8673 Personal history of transient ischemic attack (TIA), and cerebral infarction without residual deficits: Secondary | ICD-10-CM | POA: Insufficient documentation

## 2017-12-24 DIAGNOSIS — E1165 Type 2 diabetes mellitus with hyperglycemia: Secondary | ICD-10-CM | POA: Diagnosis not present

## 2017-12-24 DIAGNOSIS — E876 Hypokalemia: Secondary | ICD-10-CM | POA: Diagnosis not present

## 2017-12-24 DIAGNOSIS — Z6839 Body mass index (BMI) 39.0-39.9, adult: Secondary | ICD-10-CM | POA: Insufficient documentation

## 2017-12-24 DIAGNOSIS — K529 Noninfective gastroenteritis and colitis, unspecified: Secondary | ICD-10-CM | POA: Diagnosis not present

## 2017-12-24 DIAGNOSIS — Z79899 Other long term (current) drug therapy: Secondary | ICD-10-CM | POA: Diagnosis not present

## 2017-12-24 DIAGNOSIS — F329 Major depressive disorder, single episode, unspecified: Secondary | ICD-10-CM | POA: Insufficient documentation

## 2017-12-24 DIAGNOSIS — R112 Nausea with vomiting, unspecified: Secondary | ICD-10-CM

## 2017-12-24 DIAGNOSIS — K589 Irritable bowel syndrome without diarrhea: Secondary | ICD-10-CM | POA: Insufficient documentation

## 2017-12-24 DIAGNOSIS — I471 Supraventricular tachycardia: Secondary | ICD-10-CM | POA: Insufficient documentation

## 2017-12-24 DIAGNOSIS — Z8709 Personal history of other diseases of the respiratory system: Secondary | ICD-10-CM | POA: Insufficient documentation

## 2017-12-24 DIAGNOSIS — F419 Anxiety disorder, unspecified: Secondary | ICD-10-CM | POA: Insufficient documentation

## 2017-12-24 DIAGNOSIS — E669 Obesity, unspecified: Secondary | ICD-10-CM | POA: Diagnosis not present

## 2017-12-24 DIAGNOSIS — Z885 Allergy status to narcotic agent status: Secondary | ICD-10-CM | POA: Insufficient documentation

## 2017-12-24 DIAGNOSIS — IMO0001 Reserved for inherently not codable concepts without codable children: Secondary | ICD-10-CM

## 2017-12-24 DIAGNOSIS — E282 Polycystic ovarian syndrome: Secondary | ICD-10-CM | POA: Diagnosis not present

## 2017-12-24 DIAGNOSIS — E1065 Type 1 diabetes mellitus with hyperglycemia: Secondary | ICD-10-CM | POA: Diagnosis not present

## 2017-12-24 DIAGNOSIS — E119 Type 2 diabetes mellitus without complications: Secondary | ICD-10-CM

## 2017-12-24 DIAGNOSIS — A09 Infectious gastroenteritis and colitis, unspecified: Secondary | ICD-10-CM | POA: Diagnosis not present

## 2017-12-24 DIAGNOSIS — L732 Hidradenitis suppurativa: Secondary | ICD-10-CM | POA: Diagnosis not present

## 2017-12-24 DIAGNOSIS — F32A Depression, unspecified: Secondary | ICD-10-CM | POA: Diagnosis present

## 2017-12-24 LAB — CBG MONITORING, ED
GLUCOSE-CAPILLARY: 286 mg/dL — AB (ref 65–99)
GLUCOSE-CAPILLARY: 259 mg/dL — AB (ref 65–99)
Glucose-Capillary: 259 mg/dL — ABNORMAL HIGH (ref 65–99)

## 2017-12-24 LAB — HEPATIC FUNCTION PANEL
ALK PHOS: 92 U/L (ref 38–126)
ALT: 14 U/L (ref 14–54)
AST: 17 U/L (ref 15–41)
Albumin: 3.9 g/dL (ref 3.5–5.0)
BILIRUBIN TOTAL: 0.4 mg/dL (ref 0.3–1.2)
Total Protein: 7.4 g/dL (ref 6.5–8.1)

## 2017-12-24 LAB — CBC
HEMATOCRIT: 46.4 % — AB (ref 36.0–46.0)
HEMOGLOBIN: 16.3 g/dL — AB (ref 12.0–15.0)
MCH: 32.3 pg (ref 26.0–34.0)
MCHC: 35.1 g/dL (ref 30.0–36.0)
MCV: 91.9 fL (ref 78.0–100.0)
PLATELETS: 361 10*3/uL (ref 150–400)
RBC: 5.05 MIL/uL (ref 3.87–5.11)
RDW: 12.4 % (ref 11.5–15.5)
WBC: 8.1 10*3/uL (ref 4.0–10.5)

## 2017-12-24 LAB — URINALYSIS, ROUTINE W REFLEX MICROSCOPIC
BILIRUBIN URINE: NEGATIVE
Glucose, UA: >=500 mg/dL — AB
KETONES UR: 80 mg/dL — AB
LEUKOCYTES UA: NEGATIVE
Nitrite: NEGATIVE
Protein, ur: 100 mg/dL — AB
SPECIFIC GRAVITY, URINE: 1.036 — AB (ref 1.005–1.030)
pH: 5 (ref 5.0–8.0)

## 2017-12-24 LAB — BASIC METABOLIC PANEL
Anion gap: 12 (ref 5–15)
BUN: 18 mg/dL (ref 6–20)
CHLORIDE: 103 mmol/L (ref 101–111)
CO2: 22 mmol/L (ref 22–32)
CREATININE: 0.9 mg/dL (ref 0.44–1.00)
Calcium: 9.2 mg/dL (ref 8.9–10.3)
GFR calc non Af Amer: >60 mL/min (ref >60)
Glucose, Bld: 290 mg/dL — ABNORMAL HIGH (ref 65–99)
POTASSIUM: 4.1 mmol/L (ref 3.5–5.1)
SODIUM: 137 mmol/L (ref 135–145)

## 2017-12-24 LAB — I-STAT BETA HCG BLOOD, ED (MC, WL, AP ONLY)

## 2017-12-24 LAB — GLUCOSE, CAPILLARY
Glucose-Capillary: 316 mg/dL — ABNORMAL HIGH (ref 65–99)
Glucose-Capillary: 257 mg/dL — ABNORMAL HIGH (ref 65–99)

## 2017-12-24 LAB — LIPASE, BLOOD: Lipase: 22 U/L (ref 11–51)

## 2017-12-24 MED ORDER — INSULIN ASPART 100 UNIT/ML ~~LOC~~ SOLN
0.0000 [IU] | Freq: Three times a day (TID) | SUBCUTANEOUS | Status: DC
  Administered 2017-12-25: 5 [IU] via SUBCUTANEOUS
  Administered 2017-12-26: 7 [IU] via SUBCUTANEOUS
  Administered 2017-12-26: 6 [IU] via SUBCUTANEOUS
  Administered 2017-12-27: 5 [IU] via SUBCUTANEOUS

## 2017-12-24 MED ORDER — ONDANSETRON HCL 4 MG/2ML IJ SOLN
4.0000 mg | Freq: Four times a day (QID) | INTRAMUSCULAR | Status: DC | PRN
Start: 2017-12-24 — End: 2017-12-27
  Administered 2017-12-24 – 2017-12-27 (×3): 4 mg via INTRAVENOUS
  Filled 2017-12-24 (×4): qty 2

## 2017-12-24 MED ORDER — FENTANYL CITRATE (PF) 100 MCG/2ML IJ SOLN
50.0000 ug | INTRAMUSCULAR | Status: DC | PRN
  Administered 2017-12-24 – 2017-12-27 (×5): 50 ug via INTRAVENOUS
  Filled 2017-12-24 (×5): qty 2

## 2017-12-24 MED ORDER — SODIUM CHLORIDE 0.9 % IV BOLUS
1000.0000 mL | Freq: Once | INTRAVENOUS | Status: AC
  Administered 2017-12-24: 1000 mL via INTRAVENOUS

## 2017-12-24 MED ORDER — INSULIN GLARGINE 100 UNIT/ML ~~LOC~~ SOLN
50.0000 [IU] | Freq: Every day | SUBCUTANEOUS | Status: DC
  Administered 2017-12-24 – 2017-12-26 (×3): 50 [IU] via SUBCUTANEOUS
  Filled 2017-12-24 (×5): qty 0.5

## 2017-12-24 MED ORDER — SODIUM CHLORIDE 0.9 % IV BOLUS
1000.0000 mL | Freq: Once | INTRAVENOUS | Status: AC
Start: 2017-12-24 — End: 2017-12-24
  Administered 2017-12-24: 1000 mL via INTRAVENOUS

## 2017-12-24 MED ORDER — INSULIN ASPART 100 UNIT/ML ~~LOC~~ SOLN
0.0000 [IU] | Freq: Three times a day (TID) | SUBCUTANEOUS | Status: DC
  Administered 2017-12-24: 7 [IU] via SUBCUTANEOUS
  Administered 2017-12-25 (×3): 2 [IU] via SUBCUTANEOUS
  Administered 2017-12-26: 3 [IU] via SUBCUTANEOUS
  Administered 2017-12-26 – 2017-12-27 (×3): 2 [IU] via SUBCUTANEOUS

## 2017-12-24 MED ORDER — IOPAMIDOL (ISOVUE-300) INJECTION 61%
100.0000 mL | Freq: Once | INTRAVENOUS | Status: AC | PRN
  Administered 2017-12-24: 100 mL via INTRAVENOUS

## 2017-12-24 MED ORDER — SODIUM CHLORIDE 0.9 % IV SOLN
INTRAVENOUS | Status: DC
  Administered 2017-12-24 – 2017-12-26 (×5): via INTRAVENOUS

## 2017-12-24 MED ORDER — SODIUM CHLORIDE 0.9% FLUSH
3.0000 mL | Freq: Two times a day (BID) | INTRAVENOUS | Status: DC
  Administered 2017-12-25 – 2017-12-26 (×2): 3 mL via INTRAVENOUS

## 2017-12-24 MED ORDER — ENOXAPARIN SODIUM 40 MG/0.4ML ~~LOC~~ SOLN
40.0000 mg | SUBCUTANEOUS | Status: DC
  Administered 2017-12-24 – 2017-12-26 (×3): 40 mg via SUBCUTANEOUS
  Filled 2017-12-24 (×3): qty 0.4

## 2017-12-24 MED ORDER — ONDANSETRON 8 MG PO TBDP
8.0000 mg | ORAL_TABLET | Freq: Once | ORAL | Status: AC
  Administered 2017-12-24: 8 mg via ORAL
  Filled 2017-12-24: qty 1

## 2017-12-24 MED ORDER — IOPAMIDOL (ISOVUE-300) INJECTION 61%
INTRAVENOUS | Status: AC
  Administered 2017-12-24: 100 mL via INTRAVENOUS
  Filled 2017-12-24: qty 100

## 2017-12-24 MED ORDER — METOCLOPRAMIDE HCL 5 MG/ML IJ SOLN
10.0000 mg | Freq: Four times a day (QID) | INTRAMUSCULAR | Status: DC | PRN
  Administered 2017-12-24: 10 mg via INTRAVENOUS
  Filled 2017-12-24: qty 2

## 2017-12-24 MED ORDER — LORAZEPAM 2 MG/ML IJ SOLN: 0.5000 mg | Freq: Four times a day (QID) | INTRAMUSCULAR | Status: DC | PRN

## 2017-12-24 MED ORDER — ACETAMINOPHEN 10 MG/ML IV SOLN
1000.0000 mg | Freq: Four times a day (QID) | INTRAVENOUS | Status: AC
Start: 2017-12-24 — End: 2017-12-25
  Administered 2017-12-24 – 2017-12-25 (×4): 1000 mg via INTRAVENOUS
  Filled 2017-12-24 (×4): qty 100

## 2017-12-24 MED ORDER — PANTOPRAZOLE SODIUM 40 MG IV SOLR
40.0000 mg | Freq: Every day | INTRAVENOUS | Status: DC
Start: 2017-12-24 — End: 2017-12-27
  Administered 2017-12-24 – 2017-12-26 (×3): 40 mg via INTRAVENOUS
  Filled 2017-12-24 (×4): qty 40

## 2017-12-24 MED ORDER — FENTANYL CITRATE (PF) 100 MCG/2ML IJ SOLN
100.0000 ug | INTRAMUSCULAR | Status: AC | PRN
  Administered 2017-12-24 – 2017-12-27 (×3): 100 ug via INTRAVENOUS
  Filled 2017-12-24 (×3): qty 2

## 2017-12-24 NOTE — ED Notes (Signed)
Patient failed her PO challenge and vomited her water.

## 2017-12-24 NOTE — Plan of Care (Signed)
Patient admitted to [facility] from Emergency Room with enteritis, VSS.  Patient states pain is at a tolerable level at this time.  Awaiting stool to r/o C diff.  Educated provided to patient on enteric precautions.

## 2017-12-24 NOTE — ED Provider Notes (Signed)
[facility] DEPT Provider Note   CSN: [id number] Arrival date & time: 12/24/17  3825     History   Chief Complaint No chief complaint on file.   HPI [name] is a 37 y.o. female.  She complains of abdominal discomfort, nausea, vomiting, diarrhea and hyperglycemia present for 3 days.  She is using sliding scale insulin without relief of her hyperglycemia.  She denies recent fever, chills, dysuria, urinary frequency, blood in emesis or stool, focal weakness or paresthesia.  Today while at work she felt weak, and could not continue so came here.  She works as a Psychologist, sport and exercise.  There are no other known modifying factors.  HPI  Past Medical History:  Diagnosis Date  . Abscess of left axilla - PREVOTELLA BIVIA & Staph Coag Neg 07/12/2012   MODERATE PREVOTELLA BIVIA Note: BETA LACTAMASE NEGATIVE    . Asthma    as a child  . Cancer ([place])    skin tag rt breast  . Cellulitis 06/01/2014   RT INNER THIGH  . Depression    hx  . Diabetes ([place])   . Diabetes mellitus    IDDM, Insulin pump; followed by Dr. [name]  . GERD (gastroesophageal reflux disease)    no current meds.  . Heart murmur   . Hidradenitis 04/2012   bilat. thighs, left groin - open areas on thighs  . Hx MRSA infection   . Hydradenitis   . PCOS (polycystic ovarian syndrome)   . Pneumonia    hx  . SVT (supraventricular tachycardia) ([place]) 06/2017  . Vitamin D insufficiency     Patient Active Problem List   Diagnosis Date Noted  . Other chest pain 09/28/2017  . TIA (transient ischemic attack) 09/28/2017  . Paroxysmal SVT (supraventricular tachycardia) ([place]) 06/24/2017  . Dizziness 06/24/2017  . Weakness 06/24/2017  . Abscess 03/17/2016  . Chest discomfort 07/10/2014  . Cellulitis 06/01/2014  . Cellulitis of right thigh 06/01/2014  . Postop check 01/19/2014  . Smoking addiction 08/29/2013  . DKA, type 1 ([place]) 05/09/2013  . Hidradenitis suppurativa 04/21/2012  .  Esophageal reflux 11/17/2011  . Seasonal and perennial allergic rhinitis 05/20/2010  . BRONCHITIS, ACUTE 11/29/2007  . HYPONATREMIA 11/14/2007  . ANXIETY STATE, UNSPECIFIED 10/24/2007  . Tachycardia 07/07/2007  . Diabetes mellitus type I ([place]) 04/08/2007  . Smoker 04/08/2007  . DEPRESSION 04/08/2007  . 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 MIGRAINES, HX OF 04/08/2007    Past Surgical History:  Procedure Laterality Date  . BREAST SURGERY     right lumpectomy  . BUNIONECTOMY     left  . CARDIAC CATHETERIZATION    . CHOLECYSTECTOMY  12/02/2006   lap. chole.  Marland Kitchen DILATION AND EVACUATION  02/14/2007  . ESOPHAGOGASTRODUODENOSCOPY  11/17/2011   Procedure: ESOPHAGOGASTRODUODENOSCOPY (EGD);  Surgeon: [name], MD;  Location: [facility];  Service: Endoscopy;  Laterality: N/A;  . EYE SURGERY     exc. stye left eye  . HYDRADENITIS EXCISION  04/30/2012   Procedure: EXCISION HYDRADENITIS GROIN;  Surgeon: [name], MD;  Location: [facility];  Service: General;  Laterality: Left;  wide excision hidradenitis bilateral thighs and Left groin  . HYDRADENITIS EXCISION Left 01/13/2014   Procedure: WIDE EXCISION HIDRADENITIS LEFT AXILLA;  Surgeon: [name], MD;  Location: [facility];  Service: General;  Laterality: Left;  . IRRIGATION AND DEBRIDEMENT ABSCESS Right 06/02/2014   Procedure: IRRIGATION AND DEBRIDEMENT ABSCESS;  Surgeon: [name], MD;  Location: [facility]  OR;  Service: General;  Laterality: Right;  . IRRIGATION AND DEBRIDEMENT ABSCESS Left 09/23/2014   Procedure: IRRIGATION AND DEBRIDEMENT ABSCESS/LEFT THIGH;  Surgeon: [name], MD;  Location: [facility];  Service: General;  Laterality: Left;  . LEFT HEART CATHETERIZATION WITH CORONARY ANGIOGRAM N/A 07/14/2014   Procedure: LEFT HEART CATHETERIZATION WITH CORONARY ANGIOGRAM;  Surgeon: [name], MD;  Location: [facility] CATH LAB;  Service: Cardiovascular;  Laterality: N/A;  . TONSILLECTOMY    . WISDOM TOOTH  EXTRACTION       OB History    Gravida  4   Para      Term      Preterm      AB  3   Living  1     SAB  3   TAB      Ectopic      Multiple      Live Births               Home Medications    Prior to Admission medications   Medication Sig Start Date End Date Taking? Authorizing Provider  ALPRAZolam (XANAX) 0.25 MG tablet Take 0.25 mg by mouth 2 (two) times daily as needed for anxiety.  08/21/17  Yes [provider]  doxycycline (VIBRA-TABS) 100 MG tablet Take 1 tablet (100 mg total) by mouth 2 (two) times daily. Patient taking differently: Take 100 mg by mouth 2 (two) times daily.  03/19/16  Yes Debbe Odea, MD  folic acid (FOLVITE) 1 MG tablet Take 1 mg by mouth daily. 10/13/17  Yes [provider]  insulin glargine (LANTUS) 100 UNIT/ML injection Inject 50 Units into the skin at bedtime. Reported on 03/21/2016 05/10/13  Yes Charlynne Cousins, MD  methotrexate 2.5 MG tablet Take 2.5 mg by mouth once a week.  10/13/17  Yes [provider]  mupirocin ointment (BACTROBAN) 2 % Apply 1 application topically daily as needed. 10/13/17  Yes [provider]  ondansetron (ZOFRAN) 4 MG tablet 1 tab every 4-8 hours for nausea 12/22/17  Yes Young, Tarri Fuller D, MD  pantoprazole (PROTONIX) 40 MG tablet Take 1 tablet (40 mg total) by mouth daily. 02/03/17  Yes Young, Tarri Fuller D, MD  spironolactone (ALDACTONE) 100 MG tablet Take 1 tablet (100 mg total) by mouth daily. 04/13/17  Yes Young, Tarri Fuller D, MD  VIIBRYD 40 MG TABS TAKE 1 TABLET BY MOUTH ONCE DAILY 06/08/17  Yes Young, Tarri Fuller D, MD  glucose blood ([employer] TEST STRIPS) test strip 1 strip by Misc.(Non-Drug; Combo Route) route 4 times daily with meals and nightly. contour next glucose test strips 08/04/16   [provider]  HUMALOG 100 UNIT/ML injection  07/31/17   [provider]  Insulin Human (INSULIN PUMP) SOLN Inject into the skin continuous. *Novolog*    [provider]    levonorgestrel (MIRENA) 20 MCG/24HR IUD 1 each by Intrauterine route once. Placed 04/2013    [provider]    Family History Family History  Problem Relation Age of Onset  . Hyperlipidemia Sister   . Hypertension Sister   . Hypertension Father   . Stroke Paternal Uncle     Social History Social History   Tobacco Use  . Smoking status: Current Every Day Smoker    Packs/day: 0.25    Years: 14.00    Pack years: 3.50    Types: Cigarettes  . Smokeless tobacco: Never Used  . Tobacco comment: 1 or two a day  Substance Use Topics  . Alcohol  use: No    Alcohol/week: 0.0 oz  . Drug use: No     Allergies   Latex; Levofloxacin; Moxifloxacin; Oxycodone-acetaminophen; Peach [prunus persica]; Potassium-containing compounds; Prednisone; Propoxyphene n-acetaminophen; Rosiglitazone maleate; Xolair [omalizumab]; Adhesive [tape]; Morphine and related; Prozac [fluoxetine hcl]; Chantix [varenicline]; Citrullus vulgaris; Clindamycin/lincomycin; Humira [adalimumab]; Septra [sulfamethoxazole-trimethoprim]; Cefaclor; Keflex [cephalexin]; Promethazine hcl; and Sulfadiazine   Review of Systems Review of Systems  All other systems reviewed and are negative.    Physical Exam Updated Vital Signs BP 127/80 (BP Location: Right Arm)   Pulse (!) 112   Temp 98.2 F (36.8 C) (Oral)   Resp 18   SpO2 99%   Physical Exam  Constitutional: She is oriented to person, place, and time. She appears well-developed and well-nourished. No distress.  HENT:  Head: Normocephalic and atraumatic.  Eyes: Pupils are equal, round, and reactive to light. Conjunctivae and EOM are normal.  Neck: Normal range of motion and phonation normal. Neck supple.  Cardiovascular: Normal rate and regular rhythm.  Pulmonary/Chest: Effort normal and breath sounds normal. She exhibits no tenderness.  Abdominal: Soft. She exhibits no distension. There is tenderness (With right upper quadrant, mild). There is no rebound  and no guarding. No hernia.  Genitourinary:  Genitourinary Comments: No costovertebral angle tenderness with percussion  Musculoskeletal: Normal range of motion.  Neurological: She is alert and oriented to person, place, and time. She exhibits normal muscle tone.  Skin: Skin is warm and dry.  Psychiatric: She has a normal mood and affect. Her behavior is normal. Judgment and thought content normal.  Nursing note and vitals reviewed.    ED Treatments / Results  Labs (all labs ordered are listed, but only abnormal results are displayed) Labs Reviewed  BASIC METABOLIC PANEL - Abnormal; Notable for the following components:      Result Value   Glucose, Bld 290 (*)    All other components within normal limits  CBC - Abnormal; Notable for the following components:   Hemoglobin 16.3 (*)    HCT 46.4 (*)    All other components within normal limits  URINALYSIS, ROUTINE W REFLEX MICROSCOPIC - Abnormal; Notable for the following components:   Specific Gravity, Urine 1.036 (*)    Glucose, UA >=500 (*)    Hgb urine dipstick SMALL (*)    Ketones, ur 80 (*)    Protein, ur 100 (*)    Bacteria, UA RARE (*)    Squamous Epithelial / LPF 0-5 (*)    All other components within normal limits  CBG MONITORING, ED - Abnormal; Notable for the following components:   Glucose-Capillary 286 (*)    All other components within normal limits  CBG MONITORING, ED - Abnormal; Notable for the following components:   Glucose-Capillary 259 (*)    All other components within normal limits  I-STAT BETA HCG BLOOD, ED ([facility], [facility], [facility] ONLY)    EKG None  Radiology Ct Abdomen Pelvis W Contrast  Result Date: 12/24/2017 CLINICAL DATA:  Upper abdominal pain common nausea and vomiting. EXAM: CT ABDOMEN AND PELVIS WITH CONTRAST TECHNIQUE: Multidetector CT imaging of the abdomen and pelvis was performed using the standard protocol following bolus administration of intravenous contrast. CONTRAST:  145mL ISOVUE-300 IOPAMIDOL  (ISOVUE-300) INJECTION 61% COMPARISON:  CT scan 09/22/2016 FINDINGS: Lower chest: The lung bases are clear of acute process. No pleural effusion or pulmonary lesions. The heart is normal in size. No pericardial effusion. Mild apparent wall thickening of the distal esophagus may suggest esophagitis. Recommend correlation any  symptoms of dysphagia. Hepatobiliary: No focal hepatic lesions or intrahepatic biliary dilatation. The gallbladder surgically absent. No common bile duct dilatation. Pancreas: No mass, inflammation or ductal dilatation. Spleen: Normal size.  No focal lesions. Adrenals/Urinary Tract: The adrenal glands and kidneys are normal. No renal, ureteral or bladder calculi or mass. Stomach/Bowel: The stomach and duodenum are unremarkable. No mass or inflammation. The jejunal loops of small bowel demonstrate wall thickening, mucosal fold thickening, enhancement and mild perienteric interstitial changes and prominent Vasa recta. There also numerous small lymph nodes in the leaves of the small bowel mesentery. Findings most consistent with an inflammatory or infectious proximal enteritis. The ileal loops of small bowel appear normal. There are fluid-filled but no significant wall thickening or submucosal edema. The terminal ileum is normal. Scattered fluid in the colon also but no evidence of a colonic inflammatory process. No obstructive findings or mass. Vascular/Lymphatic: The aorta is normal in caliber. No dissection. The branch vessels are patent. The major venous structures are patent. No mesenteric or retroperitoneal mass or adenopathy. Small scattered lymph nodes are noted. Reproductive: The uterus contains a IUD.  The ovaries are normal. Other: No pelvic mass or adenopathy. No free pelvic fluid collections. No inguinal mass or adenopathy. No abdominal wall hernia or subcutaneous lesions. Musculoskeletal: No significant bony findings. IMPRESSION: 1. CT findings consistent with proximal inflammatory or  infectious enteritis (affecting the jejunum) no mass or obstruction and no involvement of the ileum or colon. 2. Status post cholecystectomy.  No biliary dilatation. 3. Distal esophageal wall thickening could be due to esophagitis. Recommend clinical correlation. Electronically Signed   By: Marijo Sanes M.D.   On: 12/24/2017 13:54    Procedures Procedures (including critical care time)  Medications Ordered in ED Medications  fentaNYL (SUBLIMAZE) injection 100 mcg (100 mcg Intravenous Given 12/24/17 1309)  sodium chloride 0.9 % bolus 1,000 mL (0 mLs Intravenous Stopped 12/24/17 1048)  sodium chloride 0.9 % bolus 1,000 mL (1,000 mLs Intravenous New Bag/Given 12/24/17 1116)  ondansetron (ZOFRAN-ODT) disintegrating tablet 8 mg (8 mg Oral Given 12/24/17 1308)  iopamidol (ISOVUE-300) 61 % injection 100 mL (100 mLs Intravenous Contrast Given 12/24/17 1323)     Initial Impression / Assessment and Plan / ED Course  I have reviewed the triage vital signs and the nursing notes.  Pertinent labs & imaging results that were available during my care of the patient were reviewed by me and considered in my medical decision making (see chart for details).  Clinical Course as of Dec 24 1500  Thu Dec 24, 2017  0952 Hyperglycemia with suspected volume depletion and secondary tachycardia.   [EW]  3235 After oral fluid challenge, patient vomited and now feels weak and is having difficulty walking.  She continues to complain of discomfort in the bilateral upper quadrants.  She is status post cholecystectomy.  CT abdomen ordered.  Narcotic analgesia ordered.   [EW]  5732 Consistent with volume depletion and hyperglycemia.  Urinalysis, Routine w reflex microscopic(!) [EW]  1233 Normal  I-Stat beta hCG blood, ED [EW]  1233 Normal except hemoglobin elevated 16.3  CBC(!) [EW]  1234 Normal except glucose high at 290.  Basic metabolic panel(!) [EW]    Clinical Course User Index [EW] Daleen Bo, MD     Patient  Vitals for the past 24 hrs:  BP Temp Temp src Pulse Resp SpO2  12/24/17 1339 127/80 - - (!) 112 18 99 %  12/24/17 1036 119/86 - - (!) 109 16 99 %  12/24/17  0928 124/90 98.2 F (36.8 C) Oral (!) 126 18 95 %    2:57 PM Reevaluation with update and discussion. After initial assessment and treatment, an updated evaluation reveals she feels somewhat better but is still uncomfortable.  She remains tachycardic.  Heart rate 126 at this time.  She is agreeable to staying here for further evaluation and treatment.[place] decision making.-Nonspecific enteritis, with hyperglycemia, dehydration, and abdominal pain.  Patient remains tachycardic in the emergency department.  She will require hospitalization for ongoing management with parenteral therapy.  No history of inflammatory bowel disease.  Doubt DKA.  Doubt bacterial infection.  There are no other known modifying factors.  Nursing Notes Reviewed/ Care Coordinated Applicable Imaging Reviewed Interpretation of Laboratory Data incorporated into ED treatment  3:02 PM-Consult complete with hospitalist. Patient case explained and discussed.  She agrees to admit patient for further evaluation and treatment. Call ended at 3:12 PM  Plan: Admit   Final Clinical Impressions(s) / ED Diagnoses   Final diagnoses:  Enteritis  Hyperglycemia  Epigastric pain  Tachycardia    ED Discharge Orders    None       Daleen Bo, MD 12/24/17 1513

## 2017-12-24 NOTE — H&P (Addendum)
History and Physical    [name] ZOX:[id number] DOB: 1981/01/06 DOA: 12/24/2017  Referring MD/NP/PA: Daleen Bo PCP: Harlan Stains, MD   Patient coming from: Home  Chief Complaint: Nausea, vomiting, diarrhea  HPI: [name] is a 37 y.o. female with history of diabetes mellitus type 1, SVT, polycystic ovarian syndrome, obesity, hidradenitis who presents with nausea, vomiting, diarrhea, and abdominal pains.  Patient states that she has intermittent problems with diarrhea which she has attributed to some of her medications.  She has been told she has IBS before but generally she has normal regular brown bowel movements.  About 3 days prior to admission she developed nausea, vomiting, and diarrhea with some right upper quadrant and epigastric pain.  She has been vomiting numerous times per day and has vomited 4-5 times already today, nonbilious, nonbloody.  She has had numerous bowel movements and has had 6 loose/watery bowel movements today.  Her last one was kind of black and tarry.  She denies any obvious blood in her stool.  Her abdominal pain has become progressively more severe and is constant.  Nothing makes it worse and she has not tried any over-the-counter medications to make it better.  She denies any NSAID use or Pepto-Bismol.  She is on chronic antibiotics and has had additional antibiotics due to some dental abscesses over the last month.  ED Course: Vital signs heart rate initially in the 120s, blood pressure stable, breathing comfortably on room air.  Labs: Patient not in DKA.  Blood sugars in the mid 200s.  White blood cell count 8.1, hemoglobin 16.3 consistent with dehydration.  Urinalysis without evidence of infection but positive for glucose and ketones.  She underwent a CT of the abdomen and pelvis which demonstrated a focal area of jejunal enteritis but no other obvious abnormalities to explain her symptoms.  Patient failed p.o. challenge in the emergency department.   She was given 2 L of IV fluids, Zofran and fentanyl in the ER.  Review of Systems: Positive chills since coming to the ER. Complete 12 point review of systems reviewed with patient and negative except as mentioned above.    Past Medical History:  Diagnosis Date  . Abscess of left axilla - PREVOTELLA BIVIA & Staph Coag Neg 07/12/2012   MODERATE PREVOTELLA BIVIA Note: BETA LACTAMASE NEGATIVE    . Asthma    as a child  . Cancer ([place])    skin tag rt breast  . Cellulitis 06/01/2014   RT INNER THIGH  . Depression    hx  . Diabetes ([place])   . Diabetes mellitus    IDDM, Insulin pump; followed by Dr. [name]  . GERD (gastroesophageal reflux disease)    no current meds.  . Heart murmur   . Hidradenitis 04/2012   bilat. thighs, left groin - open areas on thighs  . Hx MRSA infection   . Hydradenitis   . PCOS (polycystic ovarian syndrome)   . Pneumonia    hx  . SVT (supraventricular tachycardia) ([place]) 06/2017  . Vitamin D insufficiency     Past Surgical History:  Procedure Laterality Date  . BREAST SURGERY     right lumpectomy  . BUNIONECTOMY     left  . CARDIAC CATHETERIZATION    . CHOLECYSTECTOMY  12/02/2006   lap. chole.  Marland Kitchen DILATION AND EVACUATION  02/14/2007  . ESOPHAGOGASTRODUODENOSCOPY  11/17/2011   Procedure: ESOPHAGOGASTRODUODENOSCOPY (EGD);  Surgeon: [name], MD;  Location: [facility];  Service: Endoscopy;  Laterality: N/A;  . EYE SURGERY     exc. stye left eye  . HYDRADENITIS EXCISION  04/30/2012   Procedure: EXCISION HYDRADENITIS GROIN;  Surgeon: [name], MD;  Location: [place];  Service: General;  Laterality: Left;  wide excision hidradenitis bilateral thighs and Left groin  . HYDRADENITIS EXCISION Left 01/13/2014   Procedure: WIDE EXCISION HIDRADENITIS LEFT AXILLA;  Surgeon: [name], MD;  Location: [place];  Service: General;  Laterality: Left;  . IRRIGATION AND DEBRIDEMENT ABSCESS Right 06/02/2014   Procedure:  IRRIGATION AND DEBRIDEMENT ABSCESS;  Surgeon: [name], MD;  Location: [place];  Service: General;  Laterality: Right;  . IRRIGATION AND DEBRIDEMENT ABSCESS Left 09/23/2014   Procedure: IRRIGATION AND DEBRIDEMENT ABSCESS/LEFT THIGH;  Surgeon: [name], MD;  Location: [place];  Service: General;  Laterality: Left;  . LEFT HEART CATHETERIZATION WITH CORONARY ANGIOGRAM N/A 07/14/2014   Procedure: LEFT HEART CATHETERIZATION WITH CORONARY ANGIOGRAM;  Surgeon: [name], MD;  Location: [facility] CATH LAB;  Service: Cardiovascular;  Laterality: N/A;  . TONSILLECTOMY    . WISDOM TOOTH EXTRACTION       reports that she has quit smoking. Her smoking use included cigarettes. She has a 3.50 pack-year smoking history. She has never used smokeless tobacco. She reports that she does not drink alcohol or use drugs.  Allergies  Allergen Reactions  . Latex Hives, Shortness Of Breath and Rash  . Levofloxacin Shortness Of Breath and Rash  . Moxifloxacin Shortness Of Breath and Rash  . Oxycodone-Acetaminophen Shortness Of Breath, Swelling and Rash    NORCO/VICODIN OK  . Peach [Prunus Persica] Hives and Shortness Of Breath  . Potassium-Containing Compounds Other (See Comments)    IV ROUTE - CAUSES VEINS TO COLLAPS; Reports that it is undiluted K only  . Prednisone Other (See Comments)    SEVERE ELEVATION OF BLOOD SUGAR. Able to tolerate 40 mg  . Propoxyphene N-Acetaminophen Swelling    SWELLING OF FACE AND THROAT  . Rosiglitazone Maleate Swelling    SWELLING OF FACE AND LEGS  . Xolair [Omalizumab] Other (See Comments)    Rash and anaphylaxis  . Adhesive [Tape] Hives, Itching and Rash  . Morphine And Related Other (See Comments)    Causes hallucinations  . Prozac [Fluoxetine Hcl] Other (See Comments)    Made her very aggressive   . Chantix [Varenicline]     dreams  . Citrullus Vulgaris Nausea And Vomiting    Facial swelling  . Clindamycin/Lincomycin   . Humira [Adalimumab]     Does not  remember   . Septra [Sulfamethoxazole-Trimethoprim]   . Cefaclor Rash  . Keflex [Cephalexin] Diarrhea and Rash    REACTION: severe migraine  . Promethazine Hcl Other (See Comments)    IV ROUTE ONLY - JITTERY FEELING. Patient reports that it is mild and she has used promethazine since then  PO tablet ok  . Sulfadiazine Rash    Family History  Problem Relation Age of Onset  . Hyperlipidemia Sister   . Hypertension Sister   . Hypertension Father   . Stroke Paternal Uncle     Prior to Admission medications   Medication Sig Start Date End Date Taking? Authorizing Provider  ALPRAZolam (XANAX) 0.25 MG tablet Take 0.25 mg by mouth 2 (two) times daily as needed for anxiety.  08/21/17  Yes [provider]  doxycycline (VIBRA-TABS) 100 MG tablet Take 1 tablet (100 mg total) by mouth 2 (two) times daily. Patient taking differently: Take  100 mg by mouth 2 (two) times daily.  03/19/16  Yes Debbe Odea, MD  folic acid (FOLVITE) 1 MG tablet Take 1 mg by mouth daily. 10/13/17  Yes [provider]  insulin glargine (LANTUS) 100 UNIT/ML injection Inject 50 Units into the skin at bedtime. Reported on 03/21/2016 05/10/13  Yes Charlynne Cousins, MD  methotrexate 2.5 MG tablet Take 2.5 mg by mouth once a week.  10/13/17  Yes [provider]  mupirocin ointment (BACTROBAN) 2 % Apply 1 application topically daily as needed. 10/13/17  Yes [provider]  ondansetron (ZOFRAN) 4 MG tablet 1 tab every 4-8 hours for nausea 12/22/17  Yes Young, Tarri Fuller D, MD  pantoprazole (PROTONIX) 40 MG tablet Take 1 tablet (40 mg total) by mouth daily. 02/03/17  Yes Young, Tarri Fuller D, MD  spironolactone (ALDACTONE) 100 MG tablet Take 1 tablet (100 mg total) by mouth daily. 04/13/17  Yes Young, Tarri Fuller D, MD  VIIBRYD 40 MG TABS TAKE 1 TABLET BY MOUTH ONCE DAILY 06/08/17  Yes Young, Tarri Fuller D, MD  glucose blood ([employer] TEST STRIPS) test strip 1 strip by Misc.(Non-Drug; Combo Route) route 4 times  daily with meals and nightly. contour next glucose test strips 08/04/16   [provider]  HUMALOG 100 UNIT/ML injection  07/31/17   [provider]  Insulin Human (INSULIN PUMP) SOLN Inject into the skin continuous. *Novolog*    [provider]  levonorgestrel (MIRENA) 20 MCG/24HR IUD 1 each by Intrauterine route once. Placed 04/2013    [provider]    Physical Exam: Vitals:   12/24/17 0928 12/24/17 1036 12/24/17 1339  BP: 124/90 119/86 127/80  Pulse: (!) 126 (!) 109 (!) 112  Resp: 18 16 18   Temp: 98.2 F (36.8 C)    TempSrc: Oral    SpO2: 95% 99% 99%    Constitutional:  NAD, calm, comfortable Eyes: PERRL, lids and conjunctivae normal ENMT:  Moist mucous membranes.  Oropharynx nonerythematous, no exudates.   Neck:  No nuchal rigidity, no masses Respiratory:  No wheezes, rales, or rhonchi Cardiovascular: Tachycardic, regular rhythm, no murmurs / rubs / gallops.  2+ radial pulses. Abdomen:  Normal active bowel sounds, soft, nondistended, tender to palpation in the left upper quadrant, right upper quadrant and epigastric area with grimacing, voluntary guarding.  Bilateral lower quadrants are relatively nontender Musculoskeletal: Normal muscle tone and bulk.  No contractures.  Skin:  no rashes, abrasions, or ulcers Neurologic:  CN 2-12 grossly intact. Sensation intact to light touch, strength 5/5 throughout Psychiatric:  Alert and oriented x 3. Normal affect.   Labs on Admission: I have personally reviewed following labs and imaging studies  CBC: Recent Labs  Lab 12/22/17 0843 12/24/17 0943  WBC 8.8 8.1  NEUTROABS 4.9  --   HGB 16.2* 16.3*  HCT 48.0* 46.4*  MCV 94.3 91.9  PLT 378.0 287   Basic Metabolic Panel: Recent Labs  Lab 12/22/17 0843 12/24/17 0943  NA 134* 137  K 4.2 4.1  CL 96 103  CO2 23 22  GLUCOSE 397* 290*  BUN 13 18  CREATININE 0.90 0.90  CALCIUM 9.7 9.2   GFR: CrCl cannot be calculated (Unknown ideal  weight.). Liver Function Tests: No results for input(s): AST, ALT, ALKPHOS, BILITOT, PROT, ALBUMIN in the last 168 hours. No results for input(s): LIPASE, AMYLASE in the last 168 hours. No results for input(s): AMMONIA in the last 168 hours. Coagulation Profile: No results for input(s): INR, PROTIME in the last 168 hours.  Cardiac Enzymes: No results for input(s): CKTOTAL, CKMB, CKMBINDEX, TROPONINI in the last 168 hours. BNP (last 3 results) No results for input(s): PROBNP in the last 8760 hours. HbA1C: No results for input(s): HGBA1C in the last 72 hours. CBG: Recent Labs  Lab 12/24/17 0927 12/24/17 0949 12/24/17 1501  GLUCAP 259* 286* 259*   Lipid Profile: No results for input(s): CHOL, HDL, LDLCALC, TRIG, CHOLHDL, LDLDIRECT in the last 72 hours. Thyroid Function Tests: No results for input(s): TSH, T4TOTAL, FREET4, T3FREE, THYROIDAB in the last 72 hours. Anemia Panel: No results for input(s): VITAMINB12, FOLATE, FERRITIN, TIBC, IRON, RETICCTPCT in the last 72 hours. Urine analysis:    Component Value Date/Time   COLORURINE YELLOW 12/24/2017 1034   APPEARANCEUR CLEAR 12/24/2017 1034   LABSPEC 1.036 (H) 12/24/2017 1034   PHURINE 5.0 12/24/2017 1034   GLUCOSEU >=500 (A) 12/24/2017 1034   GLUCOSEU >=1000 (A) 12/22/2017 0843   HGBUR SMALL (A) 12/24/2017 1034   BILIRUBINUR NEGATIVE 12/24/2017 1034   KETONESUR 80 (A) 12/24/2017 1034   PROTEINUR 100 (A) 12/24/2017 1034   UROBILINOGEN 0.2 12/22/2017 0843   NITRITE NEGATIVE 12/24/2017 1034   LEUKOCYTESUR NEGATIVE 12/24/2017 1034   Sepsis Labs: @LABRCNTIP (procalcitonin:4,lacticidven:4) )No results found for this or any previous visit (from the past 240 hour(s)).   Radiological Exams on Admission: Ct Abdomen Pelvis W Contrast  Result Date: 12/24/2017 CLINICAL DATA:  Upper abdominal pain common nausea and vomiting. EXAM: CT ABDOMEN AND PELVIS WITH CONTRAST TECHNIQUE: Multidetector CT imaging of the abdomen and pelvis was  performed using the standard protocol following bolus administration of intravenous contrast. CONTRAST:  193mL ISOVUE-300 IOPAMIDOL (ISOVUE-300) INJECTION 61% COMPARISON:  CT scan 09/22/2016 FINDINGS: Lower chest: The lung bases are clear of acute process. No pleural effusion or pulmonary lesions. The heart is normal in size. No pericardial effusion. Mild apparent wall thickening of the distal esophagus may suggest esophagitis. Recommend correlation any symptoms of dysphagia. Hepatobiliary: No focal hepatic lesions or intrahepatic biliary dilatation. The gallbladder surgically absent. No common bile duct dilatation. Pancreas: No mass, inflammation or ductal dilatation. Spleen: Normal size.  No focal lesions. Adrenals/Urinary Tract: The adrenal glands and kidneys are normal. No renal, ureteral or bladder calculi or mass. Stomach/Bowel: The stomach and duodenum are unremarkable. No mass or inflammation. The jejunal loops of small bowel demonstrate wall thickening, mucosal fold thickening, enhancement and mild perienteric interstitial changes and prominent Vasa recta. There also numerous small lymph nodes in the leaves of the small bowel mesentery. Findings most consistent with an inflammatory or infectious proximal enteritis. The ileal loops of small bowel appear normal. There are fluid-filled but no significant wall thickening or submucosal edema. The terminal ileum is normal. Scattered fluid in the colon also but no evidence of a colonic inflammatory process. No obstructive findings or mass. Vascular/Lymphatic: The aorta is normal in caliber. No dissection. The branch vessels are patent. The major venous structures are patent. No mesenteric or retroperitoneal mass or adenopathy. Small scattered lymph nodes are noted. Reproductive: The uterus contains a IUD.  The ovaries are normal. Other: No pelvic mass or adenopathy. No free pelvic fluid collections. No inguinal mass or adenopathy. No abdominal wall hernia or  subcutaneous lesions. Musculoskeletal: No significant bony findings. IMPRESSION: 1. CT findings consistent with proximal inflammatory or infectious enteritis (affecting the jejunum) no mass or obstruction and no involvement of the ileum or colon. 2. Status post cholecystectomy.  No biliary dilatation. 3. Distal esophageal wall thickening could be due to esophagitis. Recommend clinical correlation. Electronically  Signed   By: Marijo Sanes M.D.   On: 12/24/2017 13:54    EKG: Independently reviewed.  Sinus tachycardia, no evidence of acute ischemia  Assessment/Plan Principal Problem:   Enteritis Active Problems:   Depression   Sinus tachycardia   Hidradenitis suppurativa   Paroxysmal SVT (supraventricular tachycardia) (HCC)   Intractable nausea and vomiting   Jejunal enteritis with intractable nausea/vomiting and ongoing diarrhea.  I question if she has a component of gastroparesis that may be contributing to her symptoms however I do not see any evidence of a gastric emptying study.  This can be performed as an outpatient after she has recovered from her acute illness.   -C. difficile PCR - IV fluids  - CLD -Zofran with as needed Reglan for breakthrough -Avoid NSAIDs due to history of possible melena -IV Tylenol until tolerating p.o. -Fentanyl for breakthrough pain (patient intolerant to morphine) -Outpatient gastric emptying study  Possible melena -Occult stool -  Repeat CBC in AM - monitor I/O  Sinus tachycardia with history of SVT - telemetry  -Normal saline bolus and additional IV fluids -TSH in a.m. -Doubt PE and I suspect this is related to her nausea and acute illness  Diabetes mellitus type 1 with mild hyperglycemia and without diabetic ketoacidosis -Last A1c was 13.2 in April 2018, will repeat -Continue Lantus 50 units -Patient uses a 1-7 unit sliding scale for CBG coverage with 1 unit per 7 g of carb.  She normally uses about 8-12 units at breakfast time, around 20-25  units at lunchtime and up to low 30 units at dinnertime  Hidradenitis -Hold doxycycline, methotrexate, folic acid, spironolactone until able to tolerate p.o.  Anxiety, stable, hold Viibryd until tolerating p.o.  -Patient states she rarely uses Xanax -Ativan as needed  GERD, stable, continue Protonix  DVT prophylaxis: Lovenox Code Status: Full code Family Communication:  Patient alone Disposition Plan: Likely home in a day or 2 Consults called: None Admission status: Observation  Janece Canterbury MD [facility] Pager [phone]  If 7PM-7AM, please contact night-coverage [email or web address] Password [email or web address]  12/24/2017, 4:05 PM

## 2017-12-24 NOTE — ED Triage Notes (Signed)
Pt presents to ER with a c/o abdominal pain, nausea, vomiting, elevate blood glucose since Monday. Pt sts her BG as been in 400s despite the insulin. Pt sts she is worried these are the symptoms of DKA which she had once in the past. Pt wasn't able to come in before because of the work, however today while at [employer] felt as she will pass out.

## 2017-12-24 NOTE — ED Notes (Signed)
Patient transported to CT 

## 2017-12-24 NOTE — ED Notes (Signed)
ED TO INPATIENT HANDOFF REPORT  Name/Age/Gender [name] 37 y.o. female  Code Status    Code Status Orders  (From admission, onward)        Start     Ordered   12/24/17 1600  Full code  Continuous     12/24/17 1600    Code Status History    Date Active Date Inactive Code Status Order ID Comments User Context   03/17/2016 1638 03/19/2016 1515 Full Code [id number]  Elmarie Shiley, MD Inpatient   09/22/2014 1616 09/25/2014 2033 Full Code [id number]  Cristal Ford, DO Inpatient   07/14/2014 1109 07/14/2014 1613 Full Code [id number]  Burnell Blanks, MD Inpatient   06/01/2014 1343 06/04/2014 1324 Full Code [id number]  Cristal Ford, DO Inpatient   07/12/2012 1727 07/14/2012 1722 Full Code [id number]  Hubert Azure, RN Inpatient      Home/SNF/Other Home  Chief Complaint dka  Level of Care/Admitting Diagnosis ED Disposition    ED Disposition Condition [facility]: [facility] [100102]  Level of Care: Telemetry [5]  Admit to tele based on following criteria: Other see comments  Comments: tachycardia, hx of SVT  Diagnosis: Enteritis [354656]  Admitting Physician: Janece Canterbury [phone]  Attending Physician: Janece Canterbury [[id number]]  PT Class (Do Not Modify): Observation [104]  PT Acc Code (Do Not Modify): Observation [10022]       Medical History Past Medical History:  Diagnosis Date  . Abscess of left axilla - PREVOTELLA BIVIA & Staph Coag Neg 07/12/2012   MODERATE PREVOTELLA BIVIA Note: BETA LACTAMASE NEGATIVE    . Asthma    as a child  . Cancer ([place])    skin tag rt breast  . Cellulitis 06/01/2014   RT INNER THIGH  . Depression    hx  . Diabetes ([place])   . Diabetes mellitus    IDDM, Insulin pump; followed by Dr. [name]  . GERD (gastroesophageal reflux disease)    no current meds.  . Heart murmur   . Hidradenitis 04/2012   bilat. thighs, left groin - open areas on thighs  . Hx MRSA infection   .  Hydradenitis   . PCOS (polycystic ovarian syndrome)   . Pneumonia    hx  . SVT (supraventricular tachycardia) ([place]) 06/2017  . Vitamin D insufficiency     Allergies Allergies  Allergen Reactions  . Latex Hives, Shortness Of Breath and Rash  . Levofloxacin Shortness Of Breath and Rash  . Moxifloxacin Shortness Of Breath and Rash  . Oxycodone-Acetaminophen Shortness Of Breath, Swelling and Rash    NORCO/VICODIN OK  . Peach [Prunus Persica] Hives and Shortness Of Breath  . Potassium-Containing Compounds Other (See Comments)    IV ROUTE - CAUSES VEINS TO COLLAPS; Reports that it is undiluted K only  . Prednisone Other (See Comments)    SEVERE ELEVATION OF BLOOD SUGAR. Able to tolerate 40 mg  . Propoxyphene N-Acetaminophen Swelling    SWELLING OF FACE AND THROAT  . Rosiglitazone Maleate Swelling    SWELLING OF FACE AND LEGS  . Xolair [Omalizumab] Other (See Comments)    Rash and anaphylaxis  . Adhesive [Tape] Hives, Itching and Rash  . Morphine And Related Other (See Comments)    Causes hallucinations  . Prozac [Fluoxetine Hcl] Other (See Comments)    Made her very aggressive   . Chantix [Varenicline]     dreams  . Citrullus Vulgaris Nausea And Vomiting    Facial  swelling  . Clindamycin/Lincomycin   . Humira [Adalimumab]     Does not remember   . Septra [Sulfamethoxazole-Trimethoprim]   . Cefaclor Rash  . Keflex [Cephalexin] Diarrhea and Rash    REACTION: severe migraine  . Promethazine Hcl Other (See Comments)    IV ROUTE ONLY - JITTERY FEELING. Patient reports that it is mild and she has used promethazine since then  PO tablet ok  . Sulfadiazine Rash    IV Location/Drains/Wounds Patient Lines/Drains/Airways Status   Active Line/Drains/Airways    Name:   Placement date:   Placement time:   Site:   Days:   Peripheral IV 12/24/17 Left Antecubital   12/24/17    0946    Antecubital   less than 1          Labs/Imaging Results for orders placed or performed during  the hospital encounter of 12/24/17 (from the past 48 hour(s))  CBG monitoring, ED     Status: Abnormal   Collection Time: 12/24/17  9:27 AM  Result Value Ref Range   Glucose-Capillary 259 (H) 65 - 99 mg/dL  Basic metabolic panel     Status: Abnormal   Collection Time: 12/24/17  9:43 AM  Result Value Ref Range   Sodium 137 135 - 145 mmol/L   Potassium 4.1 3.5 - 5.1 mmol/L   Chloride 103 101 - 111 mmol/L   CO2 22 22 - 32 mmol/L   Glucose, Bld 290 (H) 65 - 99 mg/dL   BUN 18 6 - 20 mg/dL   Creatinine, Ser 0.90 0.44 - 1.00 mg/dL   Calcium 9.2 8.9 - 10.3 mg/dL   GFR calc non Af Amer >60 >60 mL/min   GFR calc Af Amer >60 >60 mL/min    Comment: (NOTE) The eGFR has been calculated using the CKD EPI equation. This calculation has not been validated in all clinical situations. eGFR's persistently <60 mL/min signify possible Chronic Kidney Disease.    Anion gap 12 5 - 15    Comment: Performed at [facility], [place] [address] [place], [place] [address]  CBC     Status: Abnormal   Collection Time: 12/24/17  9:43 AM  Result Value Ref Range   WBC 8.1 4.0 - 10.5 K/uL   RBC 5.05 3.87 - 5.11 MIL/uL   Hemoglobin 16.3 (H) 12.0 - 15.0 g/dL   HCT 46.4 (H) 36.0 - 46.0 %   MCV 91.9 78.0 - 100.0 fL   MCH 32.3 26.0 - 34.0 pg   MCHC 35.1 30.0 - 36.0 g/dL   RDW 12.4 11.5 - 15.5 %   Platelets 361 150 - 400 K/uL    Comment: Performed at [facility], [place] [address], [place],  [address]  CBG monitoring, ED     Status: Abnormal   Collection Time: 12/24/17  9:49 AM  Result Value Ref Range   Glucose-Capillary 286 (H) 65 - 99 mg/dL  I-Stat beta hCG blood, ED     Status: None   Collection Time: 12/24/17 10:03 AM  Result Value Ref Range   I-stat hCG, quantitative <5.0 <5 mIU/mL   Comment 3            Comment:   GEST. AGE      CONC.  (mIU/mL)   <=1 WEEK        5 - 50     2 WEEKS       50 - 500     3 WEEKS  100 - 10,000     4 WEEKS     1,000 - 30,000         FEMALE AND NON-PREGNANT FEMALE:     LESS THAN 5 mIU/mL   Urinalysis, Routine w reflex microscopic     Status: Abnormal   Collection Time: 12/24/17 10:34 AM  Result Value Ref Range   Color, Urine YELLOW YELLOW   APPearance CLEAR CLEAR   Specific Gravity, Urine 1.036 (H) 1.005 - 1.030   pH 5.0 5.0 - 8.0   Glucose, UA >=500 (A) NEGATIVE mg/dL   Hgb urine dipstick SMALL (A) NEGATIVE   Bilirubin Urine NEGATIVE NEGATIVE   Ketones, ur 80 (A) NEGATIVE mg/dL   Protein, ur 100 (A) NEGATIVE mg/dL   Nitrite NEGATIVE NEGATIVE   Leukocytes, UA NEGATIVE NEGATIVE   RBC / HPF 6-30 0 - 5 RBC/hpf   WBC, UA 0-5 0 - 5 WBC/hpf   Bacteria, UA RARE (A) NONE SEEN   Squamous Epithelial / LPF 0-5 (A) NONE SEEN   Mucus PRESENT    Hyaline Casts, UA PRESENT     Comment: Performed at [facility], [place] [address], [place], [place] [address]  CBG monitoring, ED     Status: Abnormal   Collection Time: 12/24/17  3:01 PM  Result Value Ref Range   Glucose-Capillary 259 (H) 65 - 99 mg/dL   Ct Abdomen Pelvis W Contrast  Result Date: 12/24/2017 CLINICAL DATA:  Upper abdominal pain common nausea and vomiting. EXAM: CT ABDOMEN AND PELVIS WITH CONTRAST TECHNIQUE: Multidetector CT imaging of the abdomen and pelvis was performed using the standard protocol following bolus administration of intravenous contrast. CONTRAST:  127m ISOVUE-300 IOPAMIDOL (ISOVUE-300) INJECTION 61% COMPARISON:  CT scan 09/22/2016 FINDINGS: Lower chest: The lung bases are clear of acute process. No pleural effusion or pulmonary lesions. The heart is normal in size. No pericardial effusion. Mild apparent wall thickening of the distal esophagus may suggest esophagitis. Recommend correlation any symptoms of dysphagia. Hepatobiliary: No focal hepatic lesions or intrahepatic biliary dilatation. The gallbladder surgically absent. No common bile duct dilatation. Pancreas: No mass, inflammation or ductal dilatation. Spleen: Normal size.  No  focal lesions. Adrenals/Urinary Tract: The adrenal glands and kidneys are normal. No renal, ureteral or bladder calculi or mass. Stomach/Bowel: The stomach and duodenum are unremarkable. No mass or inflammation. The jejunal loops of small bowel demonstrate wall thickening, mucosal fold thickening, enhancement and mild perienteric interstitial changes and prominent Vasa recta. There also numerous small lymph nodes in the leaves of the small bowel mesentery. Findings most consistent with an inflammatory or infectious proximal enteritis. The ileal loops of small bowel appear normal. There are fluid-filled but no significant wall thickening or submucosal edema. The terminal ileum is normal. Scattered fluid in the colon also but no evidence of a colonic inflammatory process. No obstructive findings or mass. Vascular/Lymphatic: The aorta is normal in caliber. No dissection. The branch vessels are patent. The major venous structures are patent. No mesenteric or retroperitoneal mass or adenopathy. Small scattered lymph nodes are noted. Reproductive: The uterus contains a IUD.  The ovaries are normal. Other: No pelvic mass or adenopathy. No free pelvic fluid collections. No inguinal mass or adenopathy. No abdominal wall hernia or subcutaneous lesions. Musculoskeletal: No significant bony findings. IMPRESSION: 1. CT findings consistent with proximal inflammatory or infectious enteritis (affecting the jejunum) no mass or obstruction and no involvement of the ileum or colon. 2. Status post cholecystectomy.  No biliary dilatation. 3. Distal esophageal  wall thickening could be due to esophagitis. Recommend clinical correlation. Electronically Signed   By: Marijo Sanes M.D.   On: 12/24/2017 13:54    Pending Labs Unresulted Labs (From admission, onward)   Start     Ordered   12/25/17 4008  Basic metabolic panel  Daily,   R    Question:  Specimen collection method  Answer:  IV Team   12/24/17 1548   12/25/17 0500  CBC   Daily,   R    Question:  Specimen collection method  Answer:  IV Team   12/24/17 1548   12/25/17 0500  TSH  Tomorrow morning,   R    Question:  Specimen collection method  Answer:  IV Team   12/24/17 1549   12/25/17 0500  Hemoglobin A1c  Tomorrow morning,   R    Question:  Specimen collection method  Answer:  IV Team   12/24/17 1549   12/24/17 1559  HIV antibody (Routine Testing)  Once,   R     12/24/17 1600   12/24/17 1551  C difficile quick scan w PCR reflex  (C Difficile quick screen w PCR reflex panel)  Once, for 24 hours,   R     12/24/17 1550   12/24/17 1549  Hepatic function panel  Add-on,   R    Question:  Specimen collection method  Answer:  IV Team   12/24/17 1548   12/24/17 1549  Lipase, blood  Add-on,   R    Question:  Specimen collection method  Answer:  IV Team   12/24/17 1548   12/24/17 1548  Occult blood card to lab, stool RN will collect  Once,   R    Question:  Specimen to be collected by?  Answer:  RN will collect   12/24/17 1548      Vitals/Pain Today's Vitals   12/24/17 1311 12/24/17 1320 12/24/17 1339 12/24/17 1342  BP:   127/80   Pulse:   (!) 112   Resp:   18   Temp:      TempSrc:      SpO2:   99%   PainSc: 10-Worst pain ever 10-Worst pain ever 4  4     Isolation Precautions Enteric precautions (UV disinfection)  Medications Medications  fentaNYL (SUBLIMAZE) injection 100 mcg (100 mcg Intravenous Given 12/24/17 1614)  sodium chloride 0.9 % bolus 1,000 mL (1,000 mLs Intravenous New Bag/Given 12/24/17 1613)  0.9 %  sodium chloride infusion ( Intravenous New Bag/Given 12/24/17 1617)  ondansetron (ZOFRAN) injection 4 mg (has no administration in time range)  metoCLOPramide (REGLAN) injection 10 mg (has no administration in time range)  acetaminophen (OFIRMEV) IV 1,000 mg (has no administration in time range)  fentaNYL (SUBLIMAZE) injection 50 mcg (has no administration in time range)  insulin glargine (LANTUS) injection 50 Units (has no administration in  time range)  pantoprazole (PROTONIX) injection 40 mg (has no administration in time range)  LORazepam (ATIVAN) injection 0.5 mg (has no administration in time range)  enoxaparin (LOVENOX) injection 40 mg (has no administration in time range)  sodium chloride flush (NS) 0.9 % injection 3 mL (has no administration in time range)  insulin aspart (novoLOG) injection 0-9 Units (has no administration in time range)  insulin aspart (novoLOG) injection 0-20 Units (has no administration in time range)  sodium chloride 0.9 % bolus 1,000 mL (0 mLs Intravenous Stopped 12/24/17 1048)  sodium chloride 0.9 % bolus 1,000 mL (0 mLs Intravenous Stopped 12/24/17 1617)  ondansetron (ZOFRAN-ODT) disintegrating tablet 8 mg (8 mg Oral Given 12/24/17 1308)  iopamidol (ISOVUE-300) 61 % injection 100 mL (100 mLs Intravenous Contrast Given 12/24/17 1323)    Mobility walks

## 2017-12-24 NOTE — ED Notes (Signed)
ED Provider at bedside. 

## 2017-12-25 DIAGNOSIS — E1065 Type 1 diabetes mellitus with hyperglycemia: Secondary | ICD-10-CM | POA: Diagnosis not present

## 2017-12-25 DIAGNOSIS — L732 Hidradenitis suppurativa: Secondary | ICD-10-CM | POA: Diagnosis not present

## 2017-12-25 DIAGNOSIS — Z794 Long term (current) use of insulin: Secondary | ICD-10-CM | POA: Diagnosis not present

## 2017-12-25 DIAGNOSIS — E876 Hypokalemia: Secondary | ICD-10-CM | POA: Diagnosis not present

## 2017-12-25 DIAGNOSIS — F419 Anxiety disorder, unspecified: Secondary | ICD-10-CM | POA: Diagnosis not present

## 2017-12-25 DIAGNOSIS — F329 Major depressive disorder, single episode, unspecified: Secondary | ICD-10-CM | POA: Diagnosis not present

## 2017-12-25 DIAGNOSIS — A09 Infectious gastroenteritis and colitis, unspecified: Secondary | ICD-10-CM | POA: Diagnosis not present

## 2017-12-25 DIAGNOSIS — K529 Noninfective gastroenteritis and colitis, unspecified: Secondary | ICD-10-CM | POA: Diagnosis not present

## 2017-12-25 DIAGNOSIS — I471 Supraventricular tachycardia: Secondary | ICD-10-CM | POA: Diagnosis not present

## 2017-12-25 LAB — BASIC METABOLIC PANEL
ANION GAP: 9 (ref 5–15)
BUN: 18 mg/dL (ref 6–20)
CO2: 19 mmol/L — AB (ref 22–32)
Calcium: 7.2 mg/dL — ABNORMAL LOW (ref 8.9–10.3)
Chloride: 108 mmol/L (ref 101–111)
Creatinine, Ser: 0.66 mg/dL (ref 0.44–1.00)
Glucose, Bld: 179 mg/dL — ABNORMAL HIGH (ref 65–99)
Potassium: 3.6 mmol/L (ref 3.5–5.1)
SODIUM: 136 mmol/L (ref 135–145)

## 2017-12-25 LAB — HEMOGLOBIN A1C
HEMOGLOBIN A1C: 11.4 % — AB (ref 4.8–5.6)
MEAN PLASMA GLUCOSE: 280.48 mg/dL

## 2017-12-25 LAB — HIV ANTIBODY (ROUTINE TESTING W REFLEX): HIV Screen 4th Generation wRfx: NONREACTIVE

## 2017-12-25 LAB — CBC
HCT: 39.9 % (ref 36.0–46.0)
HEMOGLOBIN: 13.3 g/dL (ref 12.0–15.0)
MCH: 31.5 pg (ref 26.0–34.0)
MCHC: 33.3 g/dL (ref 30.0–36.0)
MCV: 94.5 fL (ref 78.0–100.0)
Platelets: 267 10*3/uL (ref 150–400)
RBC: 4.22 MIL/uL (ref 3.87–5.11)
RDW: 12.9 % (ref 11.5–15.5)
WBC: 7.4 10*3/uL (ref 4.0–10.5)

## 2017-12-25 LAB — GLUCOSE, CAPILLARY
GLUCOSE-CAPILLARY: 174 mg/dL — AB (ref 65–99)
GLUCOSE-CAPILLARY: 189 mg/dL — AB (ref 65–99)
GLUCOSE-CAPILLARY: 213 mg/dL — AB (ref 65–99)
Glucose-Capillary: 173 mg/dL — ABNORMAL HIGH (ref 65–99)

## 2017-12-25 LAB — C DIFFICILE QUICK SCREEN W PCR REFLEX
C DIFFICILE (CDIFF) INTERP: NOT DETECTED
C DIFFICILE (CDIFF) TOXIN: NEGATIVE
C Diff antigen: NEGATIVE

## 2017-12-25 LAB — TSH: TSH: 0.773 u[IU]/mL (ref 0.350–4.500)

## 2017-12-25 LAB — OCCULT BLOOD X 1 CARD TO LAB, STOOL: FECAL OCCULT BLD: POSITIVE — AB

## 2017-12-25 MED ORDER — VILAZODONE HCL 20 MG PO TABS
40.0000 mg | ORAL_TABLET | Freq: Every day | ORAL | Status: DC
  Administered 2017-12-25 – 2017-12-27 (×3): 40 mg via ORAL
  Filled 2017-12-25 (×4): qty 2

## 2017-12-25 MED ORDER — DOXYCYCLINE HYCLATE 100 MG PO TABS
100.0000 mg | ORAL_TABLET | Freq: Two times a day (BID) | ORAL | Status: DC
Start: 2017-12-25 — End: 2017-12-27
  Administered 2017-12-25 – 2017-12-27 (×5): 100 mg via ORAL
  Filled 2017-12-25 (×5): qty 1

## 2017-12-25 MED ORDER — SPIRONOLACTONE 25 MG PO TABS
100.0000 mg | ORAL_TABLET | Freq: Every day | ORAL | Status: DC
  Administered 2017-12-25 – 2017-12-27 (×3): 100 mg via ORAL
  Filled 2017-12-25 (×3): qty 4

## 2017-12-25 MED ORDER — VILAZODONE HCL 40 MG PO TABS
40.0000 mg | ORAL_TABLET | Freq: Every day | ORAL | Status: DC
  Filled 2017-12-25: qty 1

## 2017-12-25 MED ORDER — FOLIC ACID 1 MG PO TABS
1.0000 mg | ORAL_TABLET | Freq: Every day | ORAL | Status: DC
  Administered 2017-12-25 – 2017-12-27 (×3): 1 mg via ORAL
  Filled 2017-12-25 (×3): qty 1

## 2017-12-25 NOTE — Progress Notes (Addendum)
PROGRESS NOTE  [name] ZSW:[id number] DOB: May 20, 1981 DOA: 12/24/2017 PCP: Barbara Stains, MD  HPI/Recap of past 24 hours:  No more n/v, diarrhea x1, no ab pain, she is tolerating full liquid No fever  Assessment/Plan: Principal Problem:   Enteritis Active Problems:   Depression   Sinus tachycardia   Hidradenitis suppurativa   Hyperglycemia   Paroxysmal SVT (supraventricular tachycardia) (HCC)   Intractable nausea and vomiting  Jejunal enteritis with intractable nausea/vomiting and ongoing diarrhea -Ct ab/pel on presentation + "proximal inflammatory or infectious enteritis (affecting the jejunum)" " Distal esophageal wall thickening could be due to esophagitis." -she has DM1, gastroparesis also on differential -FoBt +, hgb stable -cdiff negative, no leukocytosis, no fever -continue ivf, iv ppi, advance diet as tolerated, supportive care, avoid NSAIDS -Fentanyl for breakthrough pain (patient intolerant to morphine) -consider sodium bicarb supplement if continue to have diarrhea -she is advised to follow with her GI specialist [name] to discuss EGD and gastric emptying study   Sinus tachycardia, H/o SVT -likely from dehydration and acute illness -improving with hydration,  tsh wnl, no hypoxia, no chest pain -tele unremarkable, d/c tele   Diabetes mellitus type 1 with mild hyperglycemia and without diabetic ketoacidosis a1c 11.4 Continue to adjust insulin, avoid hypoglycemia in the setting of decreased oral intake  Hidradenitis -doxycycline, methotrexate, folic acid, spironolactone resumed , now that able to  tolerate p.o.  Anxiety, stable,   Viibryd resumed tolerating p.o.  -Patient states she rarely uses Xanax -Ativan as needed    Code Status: full  Family Communication: patient   Disposition Plan: home when able to tolerate diet advancement   Consultants:  none  Procedures:  none  Antibiotics:  Home oral doxcycline   Objective: BP  (!) 98/48 (BP Location: Right Arm)   Pulse (!) 103   Temp 98.6 F (37 C) (Oral)   Resp 17   Ht 5\' 5"  (1.651 m)   Wt 108.4 kg (238 lb 15.7 oz)   SpO2 97%   BMI 39.77 kg/m   Intake/Output Summary (Last 24 hours) at 12/25/2017 1616 Last data filed at 12/25/2017 1330 Gross per 24 hour  Intake 5712.91 ml  Output 309 ml  Net 5403.91 ml   Filed Weights   12/25/17 0616  Weight: 108.4 kg (238 lb 15.7 oz)    Exam: Patient is examined daily including today on 12/25/2017, exams remain the same as of yesterday except that has changed    General:  NAD  Cardiovascular: RRR  Respiratory: CTABL  Abdomen: Soft/ND/NT, positive BS  Musculoskeletal: No Edema  Neuro: alert, oriented   Data Reviewed: Basic Metabolic Panel: Recent Labs  Lab 12/22/17 0843 12/24/17 0943 12/25/17 0421  NA 134* 137 136  K 4.2 4.1 3.6  CL 96 103 108  CO2 23 22 19*  GLUCOSE 397* 290* 179*  BUN 13 18 18   CREATININE 0.90 0.90 0.66  CALCIUM 9.7 9.2 7.2*   Liver Function Tests: Recent Labs  Lab 12/24/17 0943  AST 17  ALT 14  ALKPHOS 92  BILITOT 0.4  PROT 7.4  ALBUMIN 3.9   Recent Labs  Lab 12/24/17 0943  LIPASE 22   No results for input(s): AMMONIA in the last 168 hours. CBC: Recent Labs  Lab 12/22/17 0843 12/24/17 0943 12/25/17 0421  WBC 8.8 8.1 7.4  NEUTROABS 4.9  --   --   HGB 16.2* 16.3* 13.3  HCT 48.0* 46.4* 39.9  MCV 94.3 91.9 94.5  PLT 378.0 361 267  Cardiac Enzymes:   No results for input(s): CKTOTAL, CKMB, CKMBINDEX, TROPONINI in the last 168 hours. BNP (last 3 results) Recent Labs    07/08/17 1000  BNP 11.2    ProBNP (last 3 results) No results for input(s): PROBNP in the last 8760 hours.  CBG: Recent Labs  Lab 12/24/17 1501 12/24/17 1800 12/24/17 2150 12/25/17 0738 12/25/17 1229  GLUCAP 259* 316* 257* 174* 173*    Recent Results (from the past 240 hour(s))  C difficile quick scan w PCR reflex     Status: None   Collection Time: 12/25/17 12:30 PM    Result Value Ref Range Status   C Diff antigen NEGATIVE NEGATIVE Final   C Diff toxin NEGATIVE NEGATIVE Final   C Diff interpretation No C. difficile detected.  Final    Comment: Performed at [facility], [place] [address], [place], [place] [address]     Studies: No results found.  Scheduled Meds: . doxycycline  100 mg Oral Q12H  . enoxaparin (LOVENOX) injection  40 mg Subcutaneous Q24H  . folic acid  1 mg Oral Daily  . insulin aspart  0-20 Units Subcutaneous TID WC  . insulin aspart  0-9 Units Subcutaneous TID WC  . insulin glargine  50 Units Subcutaneous QHS  . pantoprazole (PROTONIX) IV  40 mg Intravenous QHS  . sodium chloride flush  3 mL Intravenous Q12H  . spironolactone  100 mg Oral Daily  . Vilazodone HCl  40 mg Oral Daily    Continuous Infusions: . sodium chloride 125 mL/hr at 12/25/17 1350  . acetaminophen Stopped (12/25/17 7616)     Time spent: 41mins I have personally reviewed and interpreted on  12/25/2017 daily labs, tele strips, imagings as discussed above under date review session and assessment and plans.  I reviewed all nursing notes, pharmacy notes,   vitals, pertinent old records  I have discussed plan of care as described above with RN , patient on 12/25/2017   Barbara Reasons MD, PhD  [facility] Pager [phone]. If 7PM-7AM, please contact night-coverage at [email or web address], password [facility] 12/25/2017, 4:16 PM  LOS: 0 days

## 2017-12-25 NOTE — Plan of Care (Signed)
  Problem: Clinical Measurements: Goal: Ability to maintain clinical measurements within normal limits will improve Outcome: Progressing Goal: Will remain free from infection Outcome: Progressing Goal: Diagnostic test results will improve Outcome: Progressing Goal: Respiratory complications will improve Outcome: Progressing Goal: Cardiovascular complication will be avoided Outcome: Progressing   Problem: Nutrition: Goal: Adequate nutrition will be maintained Outcome: Progressing   Problem: Coping: Goal: Level of anxiety will decrease Outcome: Progressing   Problem: Elimination: Goal: Will not experience complications related to bowel motility Outcome: Progressing Goal: Will not experience complications related to urinary retention Outcome: Progressing   Problem: Pain Managment: Goal: General experience of comfort will improve Outcome: Progressing

## 2017-12-26 ENCOUNTER — Other Ambulatory Visit: Payer: Self-pay

## 2017-12-26 DIAGNOSIS — I471 Supraventricular tachycardia: Secondary | ICD-10-CM | POA: Diagnosis not present

## 2017-12-26 DIAGNOSIS — F419 Anxiety disorder, unspecified: Secondary | ICD-10-CM | POA: Diagnosis not present

## 2017-12-26 DIAGNOSIS — A09 Infectious gastroenteritis and colitis, unspecified: Secondary | ICD-10-CM | POA: Diagnosis not present

## 2017-12-26 DIAGNOSIS — F329 Major depressive disorder, single episode, unspecified: Secondary | ICD-10-CM | POA: Diagnosis not present

## 2017-12-26 DIAGNOSIS — E1065 Type 1 diabetes mellitus with hyperglycemia: Secondary | ICD-10-CM | POA: Diagnosis not present

## 2017-12-26 DIAGNOSIS — E876 Hypokalemia: Secondary | ICD-10-CM | POA: Diagnosis not present

## 2017-12-26 DIAGNOSIS — L732 Hidradenitis suppurativa: Secondary | ICD-10-CM | POA: Diagnosis not present

## 2017-12-26 DIAGNOSIS — K529 Noninfective gastroenteritis and colitis, unspecified: Secondary | ICD-10-CM | POA: Diagnosis not present

## 2017-12-26 DIAGNOSIS — Z794 Long term (current) use of insulin: Secondary | ICD-10-CM | POA: Diagnosis not present

## 2017-12-26 LAB — CBC
HEMATOCRIT: 36.5 % (ref 36.0–46.0)
Hemoglobin: 12.4 g/dL (ref 12.0–15.0)
MCH: 31.8 pg (ref 26.0–34.0)
MCHC: 34 g/dL (ref 30.0–36.0)
MCV: 93.6 fL (ref 78.0–100.0)
Platelets: 255 10*3/uL (ref 150–400)
RBC: 3.9 MIL/uL (ref 3.87–5.11)
RDW: 12.8 % (ref 11.5–15.5)
WBC: 5.9 10*3/uL (ref 4.0–10.5)

## 2017-12-26 LAB — GLUCOSE, CAPILLARY
GLUCOSE-CAPILLARY: 204 mg/dL — AB (ref 65–99)
GLUCOSE-CAPILLARY: 165 mg/dL — AB (ref 65–99)
GLUCOSE-CAPILLARY: 156 mg/dL — AB (ref 65–99)
Glucose-Capillary: 157 mg/dL — ABNORMAL HIGH (ref 65–99)

## 2017-12-26 LAB — LIPASE, BLOOD: LIPASE: 19 U/L (ref 11–51)

## 2017-12-26 LAB — MAGNESIUM: Magnesium: 1.5 mg/dL — ABNORMAL LOW (ref 1.7–2.4)

## 2017-12-26 LAB — BASIC METABOLIC PANEL
Anion gap: 8 (ref 5–15)
BUN: 9 mg/dL (ref 6–20)
CHLORIDE: 109 mmol/L (ref 101–111)
CO2: 22 mmol/L (ref 22–32)
Calcium: 7.4 mg/dL — ABNORMAL LOW (ref 8.9–10.3)
Creatinine, Ser: 0.6 mg/dL (ref 0.44–1.00)
GFR calc Af Amer: >60 mL/min (ref >60)
GLUCOSE: 227 mg/dL — AB (ref 65–99)
POTASSIUM: 3.6 mmol/L (ref 3.5–5.1)
Sodium: 139 mmol/L (ref 135–145)

## 2017-12-26 LAB — TROPONIN I: Troponin I: <0.03 ng/mL (ref <0.03)

## 2017-12-26 MED ORDER — MAGNESIUM SULFATE 2 GM/50ML IV SOLN
2.0000 g | Freq: Once | INTRAVENOUS | Status: AC
  Administered 2017-12-26: 2 g via INTRAVENOUS
  Filled 2017-12-26: qty 50

## 2017-12-26 NOTE — Progress Notes (Addendum)
PROGRESS NOTE  [name] OZD:[id number] DOB: 10/10/1980 DOA: 12/24/2017 PCP: Harlan Stains, MD  HPI/Recap of past 24 hours:   Continue to have loose stool, she reports it is dark liquid  no fever, tolerating soft diet, no vomiting  This pm she report new left sided chest/ab pain, radiating to left side of her back,hurt with deep breath  Assessment/Plan: Principal Problem:   Enteritis Active Problems:   Depression   Sinus tachycardia   Hidradenitis suppurativa   Hyperglycemia   Paroxysmal SVT (supraventricular tachycardia) (HCC)   Intractable nausea and vomiting   Left sided chest pain/ab pain radiating to back , hurt with deep breath 5pm on 4/6) Vital stable Get ekg, cycle tropoinin, check lipase, ct chest/ab/pel  Jejunal enteritis with intractable nausea/vomiting and ongoing diarrhea (persenting symptom) -Ct ab/pel on presentation + "proximal inflammatory or infectious enteritis (affecting the jejunum)" " Distal esophageal wall thickening could be due to esophagitis." -she has DM1, gastroparesis also on differential -FoBt +, hgb stable -cdiff negative, no leukocytosis, no fever -continue ivf, iv ppi, advance diet as tolerated, supportive care, avoid NSAIDS -Fentanyl for breakthrough pain (patient intolerant to morphine) -add on gi pathogen panel, case discussed with [facility] Dr Penelope Coop who recommend again abx for now, he advised to monitor basic labs, if stable can d/c home tomorrow and outpatient gi follow up. Per Dr Penelope Coop on gi intervention needed  currently -she is advised to follow with her GI specialist Dr Nyoka Cowden to discuss EGD and gastric emptying study   Sinus tachycardia, H/o SVT -likely from dehydration and acute illness -improving with hydration, continue hydration tsh wnl, no hypoxia, no chest pain -tele unremarkable, d/c tele  Hypomagnesemia/hypokalemia: Replace k/mag   Diabetes mellitus type 1 with mild hyperglycemia and without diabetic  ketoacidosis a1c 11.4 Continue to adjust insulin, avoid hypoglycemia in the setting of decreased oral intake Diabetes education  Hidradenitis -doxycycline, methotrexate, folic acid, spironolactone resumed , now that able to  tolerate p.o.  Anxiety, stable,   Viibryd resumed tolerating p.o.  -Patient states she rarely uses Xanax -Ativan as needed    Code Status: full  Family Communication: patient   Disposition Plan: home when able to tolerate diet advancement, hgb stable   Consultants:  Case discussed with [facility] Dr Penelope Coop  Procedures:  none  Antibiotics:  Home oral doxcycline   Objective: BP (!) 99/57 (BP Location: Right Arm)   Pulse 84   Temp 98.1 F (36.7 C) (Oral)   Resp 17   Ht 5\' 5"  (1.651 m)   Wt 108.4 kg (238 lb 15.7 oz)   SpO2 100%   BMI 39.77 kg/m   Intake/Output Summary (Last 24 hours) at 12/26/2017 1107 Last data filed at 12/26/2017 0400 Gross per 24 hour  Intake 3183.83 ml  Output 6 ml  Net 3177.83 ml   Filed Weights   12/25/17 0616  Weight: 108.4 kg (238 lb 15.7 oz)    Exam: Patient is examined daily including today on 12/26/2017, exams remain the same as of yesterday except that has changed    General:  NAD, flat affect  Cardiovascular: RRR  Respiratory: CTABL  Abdomen: Soft/ND/NT, positive BS  Musculoskeletal: No Edema  Neuro: alert, oriented   Data Reviewed: Basic Metabolic Panel: Recent Labs  Lab 12/22/17 0843 12/24/17 0943 12/25/17 0421 12/26/17 0420  NA 134* 137 136 139  K 4.2 4.1 3.6 3.6  CL 96 103 108 109  CO2 23 22 19* 22  GLUCOSE 397* 290* 179* 227*  BUN 13 18 18 9   CREATININE 0.90 0.90 0.66 0.60  CALCIUM 9.7 9.2 7.2* 7.4*  MG  --   --   --  1.5*   Liver Function Tests: Recent Labs  Lab 12/24/17 0943  AST 17  ALT 14  ALKPHOS 92  BILITOT 0.4  PROT 7.4  ALBUMIN 3.9   Recent Labs  Lab 12/24/17 0943  LIPASE 22   No results for input(s): AMMONIA in the last 168 hours. CBC: Recent Labs  Lab  12/22/17 0843 12/24/17 0943 12/25/17 0421 12/26/17 0420  WBC 8.8 8.1 7.4 5.9  NEUTROABS 4.9  --   --   --   HGB 16.2* 16.3* 13.3 12.4  HCT 48.0* 46.4* 39.9 36.5  MCV 94.3 91.9 94.5 93.6  PLT 378.0 361 267 255   Cardiac Enzymes:   No results for input(s): CKTOTAL, CKMB, CKMBINDEX, TROPONINI in the last 168 hours. BNP (last 3 results) Recent Labs    07/08/17 1000  BNP 11.2    ProBNP (last 3 results) No results for input(s): PROBNP in the last 8760 hours.  CBG: Recent Labs  Lab 12/25/17 0738 12/25/17 1229 12/25/17 1639 12/25/17 2102 12/26/17 0734  GLUCAP 174* 173* 189* 213* 204*    Recent Results (from the past 240 hour(s))  C difficile quick scan w PCR reflex     Status: None   Collection Time: 12/25/17 12:30 PM  Result Value Ref Range Status   C Diff antigen NEGATIVE NEGATIVE Final   C Diff toxin NEGATIVE NEGATIVE Final   C Diff interpretation No C. difficile detected.  Final    Comment: Performed at [facility], [place] [address] [place], [place] [address]     Studies: No results found.  Scheduled Meds: . doxycycline  100 mg Oral Q12H  . enoxaparin (LOVENOX) injection  40 mg Subcutaneous Q24H  . folic acid  1 mg Oral Daily  . insulin aspart  0-20 Units Subcutaneous TID WC  . insulin aspart  0-9 Units Subcutaneous TID WC  . insulin glargine  50 Units Subcutaneous QHS  . pantoprazole (PROTONIX) IV  40 mg Intravenous QHS  . sodium chloride flush  3 mL Intravenous Q12H  . spironolactone  100 mg Oral Daily  . Vilazodone HCl  40 mg Oral Daily    Continuous Infusions: . sodium chloride 125 mL/hr at 12/25/17 1350     Time spent: 97mins, more than 50% time spent on coordination of care. I have personally reviewed and interpreted on  12/26/2017 daily labs, tele strips, imagings as discussed above under date review session and assessment and plans.  I reviewed all nursing notes, pharmacy notes,   vitals, pertinent old records  I have  discussed plan of care as described above with RN , patient on 12/26/2017   [name] MD, PhD  [facility] Pager [phone]. If 7PM-7AM, please contact night-coverage at [email or web address], password [email or web address] 12/26/2017, 11:07 AM  LOS: 0 days

## 2017-12-26 NOTE — Progress Notes (Signed)
Patient c/o sudden new pain to left chest, radiating to left back. Pain rated 9 out of 10, described as burning, only hurts when taking a deep breath in. IV Fentanyl ineffective per patient. MD notified- EKG obtained. CT renal stone and chest ordered along with troponins and Lipase. Patient sitting in bed with no apparent distress at this time. Will continue to monitor.

## 2017-12-27 ENCOUNTER — Encounter (HOSPITAL_COMMUNITY): Payer: Self-pay | Admitting: Radiology

## 2017-12-27 ENCOUNTER — Observation Stay (HOSPITAL_COMMUNITY): Payer: 59

## 2017-12-27 DIAGNOSIS — I471 Supraventricular tachycardia: Secondary | ICD-10-CM | POA: Diagnosis not present

## 2017-12-27 DIAGNOSIS — R1111 Vomiting without nausea: Secondary | ICD-10-CM | POA: Diagnosis not present

## 2017-12-27 DIAGNOSIS — K529 Noninfective gastroenteritis and colitis, unspecified: Secondary | ICD-10-CM | POA: Diagnosis not present

## 2017-12-27 DIAGNOSIS — A09 Infectious gastroenteritis and colitis, unspecified: Secondary | ICD-10-CM | POA: Diagnosis not present

## 2017-12-27 DIAGNOSIS — E1065 Type 1 diabetes mellitus with hyperglycemia: Secondary | ICD-10-CM | POA: Diagnosis not present

## 2017-12-27 DIAGNOSIS — F419 Anxiety disorder, unspecified: Secondary | ICD-10-CM | POA: Diagnosis not present

## 2017-12-27 DIAGNOSIS — F329 Major depressive disorder, single episode, unspecified: Secondary | ICD-10-CM | POA: Diagnosis not present

## 2017-12-27 DIAGNOSIS — J9811 Atelectasis: Secondary | ICD-10-CM | POA: Diagnosis not present

## 2017-12-27 DIAGNOSIS — Z794 Long term (current) use of insulin: Secondary | ICD-10-CM | POA: Diagnosis not present

## 2017-12-27 DIAGNOSIS — L732 Hidradenitis suppurativa: Secondary | ICD-10-CM | POA: Diagnosis not present

## 2017-12-27 DIAGNOSIS — E876 Hypokalemia: Secondary | ICD-10-CM | POA: Diagnosis not present

## 2017-12-27 LAB — MAGNESIUM: Magnesium: 1.7 mg/dL (ref 1.7–2.4)

## 2017-12-27 LAB — CBC
HEMATOCRIT: 36.6 % (ref 36.0–46.0)
Hemoglobin: 12.2 g/dL (ref 12.0–15.0)
MCH: 31 pg (ref 26.0–34.0)
MCHC: 33.3 g/dL (ref 30.0–36.0)
MCV: 93.1 fL (ref 78.0–100.0)
Platelets: 259 10*3/uL (ref 150–400)
RBC: 3.93 MIL/uL (ref 3.87–5.11)
RDW: 12.7 % (ref 11.5–15.5)
WBC: 8.9 10*3/uL (ref 4.0–10.5)

## 2017-12-27 LAB — TROPONIN I: Troponin I: <0.03 ng/mL (ref <0.03)

## 2017-12-27 LAB — GLUCOSE, CAPILLARY
Glucose-Capillary: 86 mg/dL (ref 65–99)
Glucose-Capillary: 158 mg/dL — ABNORMAL HIGH (ref 65–99)

## 2017-12-27 LAB — BASIC METABOLIC PANEL
Anion gap: 6 (ref 5–15)
BUN: 5 mg/dL — ABNORMAL LOW (ref 6–20)
CHLORIDE: 112 mmol/L — AB (ref 101–111)
CO2: 22 mmol/L (ref 22–32)
Calcium: 7.9 mg/dL — ABNORMAL LOW (ref 8.9–10.3)
Creatinine, Ser: 0.58 mg/dL (ref 0.44–1.00)
GFR calc Af Amer: >60 mL/min (ref >60)
GFR calc non Af Amer: >60 mL/min (ref >60)
Glucose, Bld: 115 mg/dL — ABNORMAL HIGH (ref 65–99)
POTASSIUM: 3.3 mmol/L — AB (ref 3.5–5.1)
SODIUM: 140 mmol/L (ref 135–145)

## 2017-12-27 LAB — SEDIMENTATION RATE: Sed Rate: 9 mm/hr (ref 0–22)

## 2017-12-27 LAB — C-REACTIVE PROTEIN: CRP: 1.9 mg/dL — AB (ref <1.0)

## 2017-12-27 LAB — CORTISOL: CORTISOL PLASMA: 11.6 ug/dL

## 2017-12-27 MED ORDER — SACCHAROMYCES BOULARDII 250 MG PO CAPS: 250.0000 mg | ORAL_CAPSULE | Freq: Two times a day (BID) | ORAL | 0 refills | Status: DC

## 2017-12-27 MED ORDER — POTASSIUM CHLORIDE CRYS ER 20 MEQ PO TBCR
40.0000 meq | EXTENDED_RELEASE_TABLET | Freq: Once | ORAL | Status: AC
  Administered 2017-12-27: 40 meq via ORAL
  Filled 2017-12-27: qty 2

## 2017-12-27 MED ORDER — AMOXICILLIN-POT CLAVULANATE 875-125 MG PO TABS: 1.0000 | ORAL_TABLET | Freq: Two times a day (BID) | ORAL | 0 refills | Status: AC

## 2017-12-27 MED ORDER — AMOXICILLIN-POT CLAVULANATE 875-125 MG PO TABS: 1.0000 | ORAL_TABLET | Freq: Two times a day (BID) | ORAL | 0 refills | Status: DC

## 2017-12-27 MED ORDER — MAGNESIUM SULFATE 2 GM/50ML IV SOLN
2.0000 g | Freq: Once | INTRAVENOUS | Status: AC
  Administered 2017-12-27: 2 g via INTRAVENOUS
  Filled 2017-12-27: qty 50

## 2017-12-27 NOTE — Discharge Summary (Signed)
Discharge Summary  [name] XIP:[id number] DOB: 27-Nov-1980  PCP: Barbara Stains, MD  Admit date: 12/24/2017 Discharge date: 12/27/2017  Time spent: <32mins  Recommendations for Outpatient Follow-up:  1. F/u with PMD within a week  for hospital discharge follow up, repeat cbc/bmp at follow up. 2. F/u with GI [name] 3. F/u with endocrinology  Discharge Diagnoses:  Active Hospital Problems   Diagnosis Date Noted  . Enteritis 12/24/2017  . Intractable nausea and vomiting 12/24/2017  . Paroxysmal SVT (supraventricular tachycardia) ([facility]) 06/24/2017  . Hyperglycemia 03/17/2016  . Hidradenitis suppurativa 04/21/2012  . Sinus tachycardia 07/07/2007  . Depression 04/08/2007    Resolved Hospital Problems  No resolved problems to display.    Discharge Condition: stable  Diet recommendation: heart healthy/carb modified  Filed Weights   12/25/17 0616  Weight: 108.4 kg (238 lb 15.7 oz)    History of present illness: (per admitting MD [name]) PCP: Barbara Stains, MD   Patient coming from: Home  Chief Complaint: Nausea, vomiting, diarrhea  HPI: [name] is a 37 y.o. female with history of diabetes mellitus type 1, SVT, polycystic ovarian syndrome, obesity, hidradenitis who presents with nausea, vomiting, diarrhea, and abdominal pains.  Patient states that she has intermittent problems with diarrhea which she has attributed to some of her medications.  She has been told she has IBS before but generally she has normal regular brown bowel movements.  About 3 days prior to admission she developed nausea, vomiting, and diarrhea with some right upper quadrant and epigastric pain.  She has been vomiting numerous times per day and has vomited 4-5 times already today, nonbilious, nonbloody.  She has had numerous bowel movements and has had 6 loose/watery bowel movements today.  Her last one was kind of black and tarry.  She denies any obvious blood in her stool.  Her abdominal  pain has become progressively more severe and is constant.  Nothing makes it worse and she has not tried any over-the-counter medications to make it better.  She denies any NSAID use or Pepto-Bismol.  She is on chronic antibiotics and has had additional antibiotics due to some dental abscesses over the last month.  ED Course: Vital signs heart rate initially in the 120s, blood pressure stable, breathing comfortably on room air.  Labs: Patient not in DKA.  Blood sugars in the mid 200s.  White blood cell count 8.1, hemoglobin 16.3 consistent with dehydration.  Urinalysis without evidence of infection but positive for glucose and ketones.  She underwent a CT of the abdomen and pelvis which demonstrated a focal area of jejunal enteritis but no other obvious abnormalities to explain her symptoms.  Patient failed p.o. challenge in the emergency department.  She was given 2 L of IV fluids, Zofran and fentanyl in the ER.    Hospital Course:  Principal Problem:   Enteritis Active Problems:   Depression   Sinus tachycardia   Hidradenitis suppurativa   Hyperglycemia   Paroxysmal SVT (supraventricular tachycardia) (HCC)   Intractable nausea and vomiting    Jejunal enteritis withintractablenausea/vomiting andongoingdiarrhea (persenting symptom) -Ct ab/pel on presentation + "proximal inflammatory or infectious enteritis (affecting the jejunum)" " Distal esophageal wall thickening could be due to esophagitis." -she has DM1, gastroparesis also on differential -FoBt +, hgb stable -cdiff negative, no leukocytosis, no fever -she received ivf, iv ppi, advance diet as tolerated, supportive care, avoid NSAIDS -Fentanyl for breakthrough pain(patient intolerant to morphine) -case discussed with [name] , per Barbara  Fitzgerald abx  Or inpatient gi intervention is not warranted. he advised to monitor basic labs, if stable can d/c home  and outpatient gi follow up.  -symptom resolved, she tolerated diet  advancement, hgb stable. Repeat ct ab prior to discharge "Findings suggesting enteritis on the prior CT are not evident on the current study. No evidence of bowel inflammation" -she is advised to follow with her GI specialist [name] to discuss EGD and gastric emptying study  Left sided chest pain/ab pain radiating to back , hurt with deep breath, developed on  5pm on 4/6 Vital stable ekg, cycle tropoinin,  Lipase unremakable Repeat  ct chest/ab/pel ordered on 4/6, done on 4/7  IMPRESSION: CHEST CT  1. Linear atelectasis has developed in the lower lung since the prior study. There is no convincing pneumonia. No evidence of pulmonary edema. 2. Mild left coronary artery calcifications.  ABDOMEN AND PELVIS CT  1. No acute findings within the abdomen or pelvis. 2. Findings suggesting enteritis on the prior CT are not evident on the current study. No evidence of bowel inflammation. 3. No renal or ureteral stones or obstructive uropathy. 4. Status post cholecystectomy.   Due to immunosuppressed status, will d/c on augmentin to cover possible aspiration pneunomia. pmd follow up.  Sinus tachycardia,  -she has a H/o SVT - sinus tachycardia likely from dehydration and acute illness -tsh wnl, no hypoxia, no chest pain -tele unremarkable -she received hydration, sinus tachycardia resolved.  Hypomagnesemia/hypokalemia:  k/mag replaced.   Diabetes mellitus type 1 with mild hyperglycemia and without diabetic ketoacidosis a1c 11.4 Continue to adjust insulin, avoid hypoglycemia in the setting of decreased oral intake Diabetes education Close follow up with endocrinology  Hidradenitis -doxycycline, methotrexate, folic acid, spironolactone resumed , now that she is able to  tolerate p.o.  Anxiety, stable,  Viibrydresumed tolerating p.o. -Patient states she rarely usesXanax  Body mass index is 39.77 kg/m.    Code Status: full  Family Communication: patient     Disposition Plan: home   Consultants:  Case discussed with [name]  Procedures:  none  Antibiotics:  Home oral doxcycline   Discharge Exam: BP 114/72 (BP Location: Left Arm)   Pulse 96   Temp 98.3 F (36.8 C) (Oral)   Resp 16   Ht 5\' 5"  (1.651 m)   Wt 108.4 kg (238 lb 15.7 oz)   SpO2 95%   BMI 39.77 kg/m    General:  NAD, flat affect  Cardiovascular: RRR  Respiratory: CTABL  Abdomen: Soft/ND/NT, positive BS  Musculoskeletal: No Edema  Neuro: alert, oriented    Discharge Instructions You were cared for by a hospitalist during your hospital stay. If you have any questions about your discharge medications or the care you received while you were in the hospital after you are discharged, you can call the unit and asked to speak with the hospitalist on call if the hospitalist that took care of you is not available. Once you are discharged, your primary care physician will handle any further medical issues. Please note that NO REFILLS for any discharge medications will be authorized once you are discharged, as it is imperative that you return to your primary care physician (or establish a relationship with a primary care physician if you do not have one) for your aftercare needs so that they can reassess your need for medications and monitor your lab values.  Discharge Instructions    Diet Carb Modified   Complete by:  As directed  Increase activity slowly   Complete by:  As directed      Allergies as of 12/27/2017      Reactions   Latex Hives, Shortness Of Breath, Rash   Levofloxacin Shortness Of Breath, Rash   Moxifloxacin Shortness Of Breath, Rash   Oxycodone-acetaminophen Shortness Of Breath, Swelling, Rash   NORCO/VICODIN OK   Peach [prunus Persica] Hives, Shortness Of Breath   Potassium-containing Compounds Other (See Comments)   IV ROUTE - CAUSES VEINS TO COLLAPS; Reports that it is undiluted K only   Prednisone Other (See Comments)    SEVERE ELEVATION OF BLOOD SUGAR. Able to tolerate 40 mg   Propoxyphene N-acetaminophen Swelling   SWELLING OF FACE AND THROAT   Rosiglitazone Maleate Swelling   SWELLING OF FACE AND LEGS   Xolair [omalizumab] Other (See Comments)   Rash and anaphylaxis   Adhesive [tape] Hives, Itching, Rash   Morphine And Related Other (See Comments)   Causes hallucinations   Prozac [fluoxetine Hcl] Other (See Comments)   Made her very aggressive    Chantix [varenicline]    dreams   Citrullus Vulgaris Nausea And Vomiting   Facial swelling   Clindamycin/lincomycin    Humira [adalimumab]    Does not remember    Septra [sulfamethoxazole-trimethoprim]    Cefaclor Rash   Keflex [cephalexin] Diarrhea, Rash   REACTION: severe migraine   Promethazine Hcl Other (See Comments)   IV ROUTE ONLY - JITTERY FEELING. Patient reports that it is mild and she has used promethazine since then PO tablet ok   Sulfadiazine Rash      Medication List    TAKE these medications   ALPRAZolam 0.25 MG tablet Commonly known as:  XANAX Take 0.25 mg by mouth 2 (two) times daily as needed for anxiety.   amoxicillin-clavulanate 875-125 MG tablet Commonly known as:  AUGMENTIN Take 1 tablet by mouth 2 (two) times daily for 5 days.   doxycycline 100 MG tablet Commonly known as:  VIBRA-TABS Take 1 tablet (100 mg total) by mouth 2 (two) times daily.   folic acid 1 MG tablet Commonly known as:  FOLVITE Take 1 mg by mouth daily.   HUMALOG 100 UNIT/ML injection Generic drug:  insulin lispro   insulin glargine 100 UNIT/ML injection Commonly known as:  LANTUS Inject 50 Units into the skin at bedtime. Reported on 03/21/2016   insulin pump Soln Inject into the skin continuous. *Novolog*   [employer] TEST STRIPS test strip Generic drug:  glucose blood 1 strip by Misc.(Non-Drug; Combo Route) route 4 times daily with meals and nightly. contour next glucose test strips   levonorgestrel 20 MCG/24HR IUD Commonly known as:   MIRENA 1 each by Intrauterine route once. Placed 04/2013   methotrexate 2.5 MG tablet Take 2.5 mg by mouth once a week.   mupirocin ointment 2 % Commonly known as:  BACTROBAN Apply 1 application topically daily as needed.   ondansetron 4 MG tablet Commonly known as:  ZOFRAN 1 tab every 4-8 hours for nausea   pantoprazole 40 MG tablet Commonly known as:  PROTONIX Take 1 tablet (40 mg total) by mouth daily.   saccharomyces boulardii 250 MG capsule Commonly known as:  FLORASTOR Take 1 capsule (250 mg total) by mouth 2 (two) times daily.   spironolactone 100 MG tablet Commonly known as:  ALDACTONE Take 1 tablet (100 mg total) by mouth daily.   VIIBRYD 40 MG Tabs Generic drug:  Vilazodone HCl TAKE 1 TABLET BY MOUTH ONCE DAILY  Allergies  Allergen Reactions  . Latex Hives, Shortness Of Breath and Rash  . Levofloxacin Shortness Of Breath and Rash  . Moxifloxacin Shortness Of Breath and Rash  . Oxycodone-Acetaminophen Shortness Of Breath, Swelling and Rash    NORCO/VICODIN OK  . Peach [Prunus Persica] Hives and Shortness Of Breath  . Potassium-Containing Compounds Other (See Comments)    IV ROUTE - CAUSES VEINS TO COLLAPS; Reports that it is undiluted K only  . Prednisone Other (See Comments)    SEVERE ELEVATION OF BLOOD SUGAR. Able to tolerate 40 mg  . Propoxyphene N-Acetaminophen Swelling    SWELLING OF FACE AND THROAT  . Rosiglitazone Maleate Swelling    SWELLING OF FACE AND LEGS  . Xolair [Omalizumab] Other (See Comments)    Rash and anaphylaxis  . Adhesive [Tape] Hives, Itching and Rash  . Morphine And Related Other (See Comments)    Causes hallucinations  . Prozac [Fluoxetine Hcl] Other (See Comments)    Made her very aggressive   . Chantix [Varenicline]     dreams  . Citrullus Vulgaris Nausea And Vomiting    Facial swelling  . Clindamycin/Lincomycin   . Humira [Adalimumab]     Does not remember   . Septra [Sulfamethoxazole-Trimethoprim]   . Cefaclor  Rash  . Keflex [Cephalexin] Diarrhea and Rash    REACTION: severe migraine  . Promethazine Hcl Other (See Comments)    IV ROUTE ONLY - JITTERY FEELING. Patient reports that it is mild and she has used promethazine since then  PO tablet ok  . Sulfadiazine Rash   Follow-up Information    Barbara Stains, MD Follow up in 1 week(s).   Specialty:  Family Medicine Why:  hospital discharge follow up. repeat cbc/bmp at follow up.  Contact information: [place], [place] [address] [phone]        Vladimir Crofts, MD Follow up on 01/01/2018.   Specialty:  Gastroenterology Why:  for enteritis affecting jejunum, Distal esophageal wall thickening could be due to esophagitis. repeat ct imaging did not show significant enteritis though. please discuss with [name] regarding gastic emptying study and EGD.   Contact information: [place] [address] [phone]        Tempie Hoist, MD Follow up.   Specialty:  Endocrinology Why:  for diabetes Contact information: [place] [address] [phone]            The results of significant diagnostics from this hospitalization (including imaging, microbiology, ancillary and laboratory) are listed below for reference.    Significant Diagnostic Studies: Ct Chest Wo Contrast  Result Date: 12/27/2017 CLINICAL DATA:  Right lower abdominal pain. Left-sided chest pain. Nausea, vomiting and diarrhea. EXAM: CT CHEST, ABDOMEN AND PELVIS WITHOUT CONTRAST TECHNIQUE: Multidetector CT imaging of the chest, abdomen and pelvis was performed following the standard protocol without IV contrast. COMPARISON:  Abdomen and pelvis CT, 12/24/2017. FINDINGS: CT CHEST FINDINGS Cardiovascular: Heart is normal in size and configuration. No pericardial effusion. Mild left coronary artery calcifications. Great vessels are normal in caliber. No aortic atherosclerosis.  Mediastinum/Nodes: No enlarged mediastinal, hilar, or axillary lymph nodes. Thyroid gland, trachea, and esophagus demonstrate no significant findings. Lungs/Pleura: Linear opacities have developed in the lower lobes, right middle lobe and left upper lobe lingula, consistent with atelectasis. This is new since the prior CT scan. There is no lung consolidation to suggest pneumonia. No evidence of pulmonary edema. No lung mass or nodule. No  pleural effusion or pneumothorax. Musculoskeletal: No fracture or acute finding. No osteoblastic or osteolytic lesions. Minor disc degenerative changes along the mid to lower thoracic spine. CT ABDOMEN PELVIS FINDINGS Hepatobiliary: No focal liver abnormality is seen. Status post cholecystectomy. No biliary dilatation. Pancreas: Unremarkable. No pancreatic ductal dilatation or surrounding inflammatory changes. Spleen: Normal in size without focal abnormality. Adrenals/Urinary Tract: Adrenal glands are unremarkable. Kidneys are normal, without renal calculi, focal lesion, or hydronephrosis. Bladder is unremarkable. Stomach/Bowel: Normal stomach. Small bowel wall thickening noted on the prior study is no longer evident. No small bowel dilation, wall thickening or adjacent inflammation. Normal colon. Normal appendix. Vascular/Lymphatic: No significant vascular findings are present. No enlarged abdominal or pelvic lymph nodes. Reproductive: Uterus normal in size. Well-positioned IUD. No adnexal masses. Other: No abdominal wall hernia or abnormality. No abdominopelvic ascites. Musculoskeletal: No acute or significant osseous findings. IMPRESSION: CHEST CT 1. Linear atelectasis has developed in the lower lung since the prior study. There is no convincing pneumonia. No evidence of pulmonary edema. 2. Mild left coronary artery calcifications. ABDOMEN AND PELVIS CT 1. No acute findings within the abdomen or pelvis. 2. Findings suggesting enteritis on the prior CT are not evident on the  current study. No evidence of bowel inflammation. 3. No renal or ureteral stones or obstructive uropathy. 4. Status post cholecystectomy. Electronically Signed   By: Lajean Manes M.D.   On: 12/27/2017 12:24   Ct Abdomen Pelvis W Contrast  Result Date: 12/24/2017 CLINICAL DATA:  Upper abdominal pain common nausea and vomiting. EXAM: CT ABDOMEN AND PELVIS WITH CONTRAST TECHNIQUE: Multidetector CT imaging of the abdomen and pelvis was performed using the standard protocol following bolus administration of intravenous contrast. CONTRAST:  175mL ISOVUE-300 IOPAMIDOL (ISOVUE-300) INJECTION 61% COMPARISON:  CT scan 09/22/2016 FINDINGS: Lower chest: The lung bases are clear of acute process. No pleural effusion or pulmonary lesions. The heart is normal in size. No pericardial effusion. Mild apparent wall thickening of the distal esophagus may suggest esophagitis. Recommend correlation any symptoms of dysphagia. Hepatobiliary: No focal hepatic lesions or intrahepatic biliary dilatation. The gallbladder surgically absent. No common bile duct dilatation. Pancreas: No mass, inflammation or ductal dilatation. Spleen: Normal size.  No focal lesions. Adrenals/Urinary Tract: The adrenal glands and kidneys are normal. No renal, ureteral or bladder calculi or mass. Stomach/Bowel: The stomach and duodenum are unremarkable. No mass or inflammation. The jejunal loops of small bowel demonstrate wall thickening, mucosal fold thickening, enhancement and mild perienteric interstitial changes and prominent Vasa recta. There also numerous small lymph nodes in the leaves of the small bowel mesentery. Findings most consistent with an inflammatory or infectious proximal enteritis. The ileal loops of small bowel appear normal. There are fluid-filled but no significant wall thickening or submucosal edema. The terminal ileum is normal. Scattered fluid in the colon also but no evidence of a colonic inflammatory process. No obstructive findings  or mass. Vascular/Lymphatic: The aorta is normal in caliber. No dissection. The branch vessels are patent. The major venous structures are patent. No mesenteric or retroperitoneal mass or adenopathy. Small scattered lymph nodes are noted. Reproductive: The uterus contains a IUD.  The ovaries are normal. Other: No pelvic mass or adenopathy. No free pelvic fluid collections. No inguinal mass or adenopathy. No abdominal wall hernia or subcutaneous lesions. Musculoskeletal: No significant bony findings. IMPRESSION: 1. CT findings consistent with proximal inflammatory or infectious enteritis (affecting the jejunum) no mass or obstruction and no involvement of the ileum or colon. 2. Status post cholecystectomy.  No biliary dilatation. 3. Distal esophageal wall thickening could be due to esophagitis. Recommend clinical correlation. Electronically Signed   By: Marijo Sanes M.D.   On: 12/24/2017 13:54   Ct Renal Stone Study  Result Date: 12/27/2017 CLINICAL DATA:  Right lower abdominal pain. Left-sided chest pain. Nausea, vomiting and diarrhea. EXAM: CT CHEST, ABDOMEN AND PELVIS WITHOUT CONTRAST TECHNIQUE: Multidetector CT imaging of the chest, abdomen and pelvis was performed following the standard protocol without IV contrast. COMPARISON:  Abdomen and pelvis CT, 12/24/2017. FINDINGS: CT CHEST FINDINGS Cardiovascular: Heart is normal in size and configuration. No pericardial effusion. Mild left coronary artery calcifications. Great vessels are normal in caliber. No aortic atherosclerosis. Mediastinum/Nodes: No enlarged mediastinal, hilar, or axillary lymph nodes. Thyroid gland, trachea, and esophagus demonstrate no significant findings. Lungs/Pleura: Linear opacities have developed in the lower lobes, right middle lobe and left upper lobe lingula, consistent with atelectasis. This is new since the prior CT scan. There is no lung consolidation to suggest pneumonia. No evidence of pulmonary edema. No lung mass or nodule.  No pleural effusion or pneumothorax. Musculoskeletal: No fracture or acute finding. No osteoblastic or osteolytic lesions. Minor disc degenerative changes along the mid to lower thoracic spine. CT ABDOMEN PELVIS FINDINGS Hepatobiliary: No focal liver abnormality is seen. Status post cholecystectomy. No biliary dilatation. Pancreas: Unremarkable. No pancreatic ductal dilatation or surrounding inflammatory changes. Spleen: Normal in size without focal abnormality. Adrenals/Urinary Tract: Adrenal glands are unremarkable. Kidneys are normal, without renal calculi, focal lesion, or hydronephrosis. Bladder is unremarkable. Stomach/Bowel: Normal stomach. Small bowel wall thickening noted on the prior study is no longer evident. No small bowel dilation, wall thickening or adjacent inflammation. Normal colon. Normal appendix. Vascular/Lymphatic: No significant vascular findings are present. No enlarged abdominal or pelvic lymph nodes. Reproductive: Uterus normal in size. Well-positioned IUD. No adnexal masses. Other: No abdominal wall hernia or abnormality. No abdominopelvic ascites. Musculoskeletal: No acute or significant osseous findings. IMPRESSION: CHEST CT 1. Linear atelectasis has developed in the lower lung since the prior study. There is no convincing pneumonia. No evidence of pulmonary edema. 2. Mild left coronary artery calcifications. ABDOMEN AND PELVIS CT 1. No acute findings within the abdomen or pelvis. 2. Findings suggesting enteritis on the prior CT are not evident on the current study. No evidence of bowel inflammation. 3. No renal or ureteral stones or obstructive uropathy. 4. Status post cholecystectomy. Electronically Signed   By: Lajean Manes M.D.   On: 12/27/2017 12:24    Microbiology: Recent Results (from the past 240 hour(s))  C difficile quick scan w PCR reflex     Status: None   Collection Time: 12/25/17 12:30 PM  Result Value Ref Range Status   C Diff antigen NEGATIVE NEGATIVE Final   C  Diff toxin NEGATIVE NEGATIVE Final   C Diff interpretation No C. difficile detected.  Final    Comment: Performed at [facility], [place] [address] [place], [place] [address]     Labs: Basic Metabolic Panel: Recent Labs  Lab 12/22/17 0843 12/24/17 0943 12/25/17 0421 12/26/17 0420 12/27/17 0555  NA 134* 137 136 139 140  K 4.2 4.1 3.6 3.6 3.3*  CL 96 103 108 109 112*  CO2 23 22 19* 22 22  GLUCOSE 397* 290* 179* 227* 115*  BUN 13 18 18 9  5*  CREATININE 0.90 0.90 0.66 0.60 0.58  CALCIUM 9.7 9.2 7.2* 7.4* 7.9*  MG  --   --   --  1.5* 1.7  Liver Function Tests: Recent Labs  Lab 12/24/17 0943  AST 17  ALT 14  ALKPHOS 92  BILITOT 0.4  PROT 7.4  ALBUMIN 3.9   Recent Labs  Lab 12/24/17 0943 12/26/17 1758  LIPASE 22 19   No results for input(s): AMMONIA in the last 168 hours. CBC: Recent Labs  Lab 12/22/17 0843 12/24/17 0943 12/25/17 0421 12/26/17 0420 12/27/17 0555  WBC 8.8 8.1 7.4 5.9 8.9  NEUTROABS 4.9  --   --   --   --   HGB 16.2* 16.3* 13.3 12.4 12.2  HCT 48.0* 46.4* 39.9 36.5 36.6  MCV 94.3 91.9 94.5 93.6 93.1  PLT 378.0 361 267 255 259   Cardiac Enzymes: Recent Labs  Lab 12/26/17 1758 12/27/17 0006 12/27/17 0555  TROPONINI <0.03 <0.03 <0.03   BNP: BNP (last 3 results) Recent Labs    07/08/17 1000  BNP 11.2    ProBNP (last 3 results) No results for input(s): PROBNP in the last 8760 hours.  CBG: Recent Labs  Lab 12/26/17 1155 12/26/17 1700 12/26/17 2229 12/27/17 0754 12/27/17 1133  GLUCAP 165* 157* 156* 86 158*       Signed:  [name] MD, PhD  [facility] 12/27/2017, 1:14 PM

## 2017-12-28 ENCOUNTER — Other Ambulatory Visit: Payer: Self-pay | Admitting: *Deleted

## 2017-12-28 NOTE — Patient Outreach (Signed)
[name]) Care Management  12/28/2017  [name] 15-Sep-1981 [id number]  Attempted to reach [name] via mobile number to complete initial transition of care phone assessment. [name] was admitted to  [facility] on 12/24/17 after she presented to the emergency department with nausea, vomiting and diarrhea accompanied by right upper quadrant and epigastric pain x 3 days.  CT of her abdomen demonstrated a focal area of jejunal enteritis. She was discharged home on 12/27/17 on oral Augmentin and without skilled needs. She was instructed to follow up with her primary care provider, gastroenterologist and endocrinologist. (Her Hgb A1C was 11.4% on 12/25/17, she has Type I DM) Left message for [facility] requesting return call.  Barrington Ellison RN,CCM,CDE [place] Management Coordinator Office Phone [phone] Office Fax [phone]

## 2017-12-29 ENCOUNTER — Telehealth: Payer: Self-pay | Admitting: Internal Medicine

## 2017-12-29 MED ORDER — VARENICLINE TARTRATE 0.5 MG X 11 & 1 MG X 42 PO MISC: ORAL | 0 refills | Status: DC

## 2017-12-29 NOTE — Telephone Encounter (Signed)
Asks Chantix starter kit Rx sent to [facility]

## 2017-12-30 ENCOUNTER — Other Ambulatory Visit: Payer: Self-pay | Admitting: *Deleted

## 2017-12-30 NOTE — Patient Outreach (Signed)
[facility]) Care Management  12/30/2017  JOELLY BOLANOS Oct 04, 1980 [id number]  Orpah did not respond to a voice mail left on her cell phone on 4/8 regarding post hospital discharge transition of care assessment. Sent secure e-mail to [facility] e-mail address requesting she contact this RNCM or provide update on her health status via secure e-mail. Await Jocelynne's response.   Barrington Ellison RN,CCM,CDE [place] Management Coordinator Office Phone [phone] Office Fax [phone]

## 2017-12-31 ENCOUNTER — Other Ambulatory Visit: Payer: Self-pay | Admitting: *Deleted

## 2017-12-31 NOTE — Patient Outreach (Signed)
[facility]) Care Management  12/31/2017  [name] [birth year] [id number]   [name] responded via e- mail on 4/10 at 5:23 pm to this RNCM's previous e-mail requesting clinical update to complete transition of care assessment. She was discharged from the hospital on 4/7 after admission on 4/4 for enteritis. She states she is feeling better and has follow up appointments on 4/12. She says she is changing jobs in the [employer] system to a less stressful one. Requested [name] send this RNCM an update after she sees her providers on 4/12.  Barrington Ellison RN,CCM,CDE [place] Management Coordinator Office Phone [phone] Office Fax [phone]

## 2018-01-01 DIAGNOSIS — R109 Unspecified abdominal pain: Secondary | ICD-10-CM | POA: Diagnosis not present

## 2018-01-01 DIAGNOSIS — K219 Gastro-esophageal reflux disease without esophagitis: Secondary | ICD-10-CM | POA: Diagnosis not present

## 2018-01-01 DIAGNOSIS — R112 Nausea with vomiting, unspecified: Secondary | ICD-10-CM | POA: Diagnosis not present

## 2018-01-01 DIAGNOSIS — R197 Diarrhea, unspecified: Secondary | ICD-10-CM | POA: Diagnosis not present

## 2018-01-05 ENCOUNTER — Ambulatory Visit: Payer: 59 | Admitting: Neurology

## 2018-01-06 ENCOUNTER — Other Ambulatory Visit: Payer: Self-pay | Admitting: *Deleted

## 2018-01-06 NOTE — Patient Outreach (Signed)
[facility]) Care Management  01/06/2018  JERALYNN VAQUERA 09-18-1981 [id number]   Tache sent return e-mail with the following update regarding her visits with her providers post hospital discharge 4/7 for enteritis.    I met with the GI dr. Lenon Ahmadi he wants to keep an eye on how things go for me-if I develop any diarrhea-blood in stool again with stomach pain then I am to call him for appointment. If I continue to get aspiration PNA or notice I have issues with swallowing then he will do an Endo on me.    No further follow up indicated as Antanisha is receiving chronic disease management services via [place] Management program.    Barrington Ellison RN,CCM,CDE [place] Management Coordinator Office Phone [phone] Office Fax [phone]

## 2018-01-11 DIAGNOSIS — E109 Type 1 diabetes mellitus without complications: Secondary | ICD-10-CM | POA: Diagnosis not present

## 2018-01-20 ENCOUNTER — Telehealth: Payer: Self-pay | Admitting: Internal Medicine

## 2018-01-20 MED ORDER — VILAZODONE HCL 40 MG PO TABS: 40.0000 mg | ORAL_TABLET | Freq: Every day | ORAL | 2 refills | Status: DC

## 2018-01-20 NOTE — Telephone Encounter (Signed)
Asks refill Viibryd pending her next office visit with provider. P - Viibryd 40 mg, # 30, ref x 2

## 2018-02-18 ENCOUNTER — Other Ambulatory Visit: Payer: Self-pay | Admitting: Internal Medicine

## 2018-02-18 MED ORDER — PANTOPRAZOLE SODIUM 40 MG PO TBEC: 40.0000 mg | DELAYED_RELEASE_TABLET | Freq: Every day | ORAL | 4 refills | Status: DC

## 2018-03-03 IMAGING — CR DG CHEST 2V
2 series · 2 of 2 positions shown · non-contrast
Comparison: Radiographs January 30, 2016.

CLINICAL DATA: Cough, fever.

EXAM:
CHEST  2 VIEW

[w chest pa]
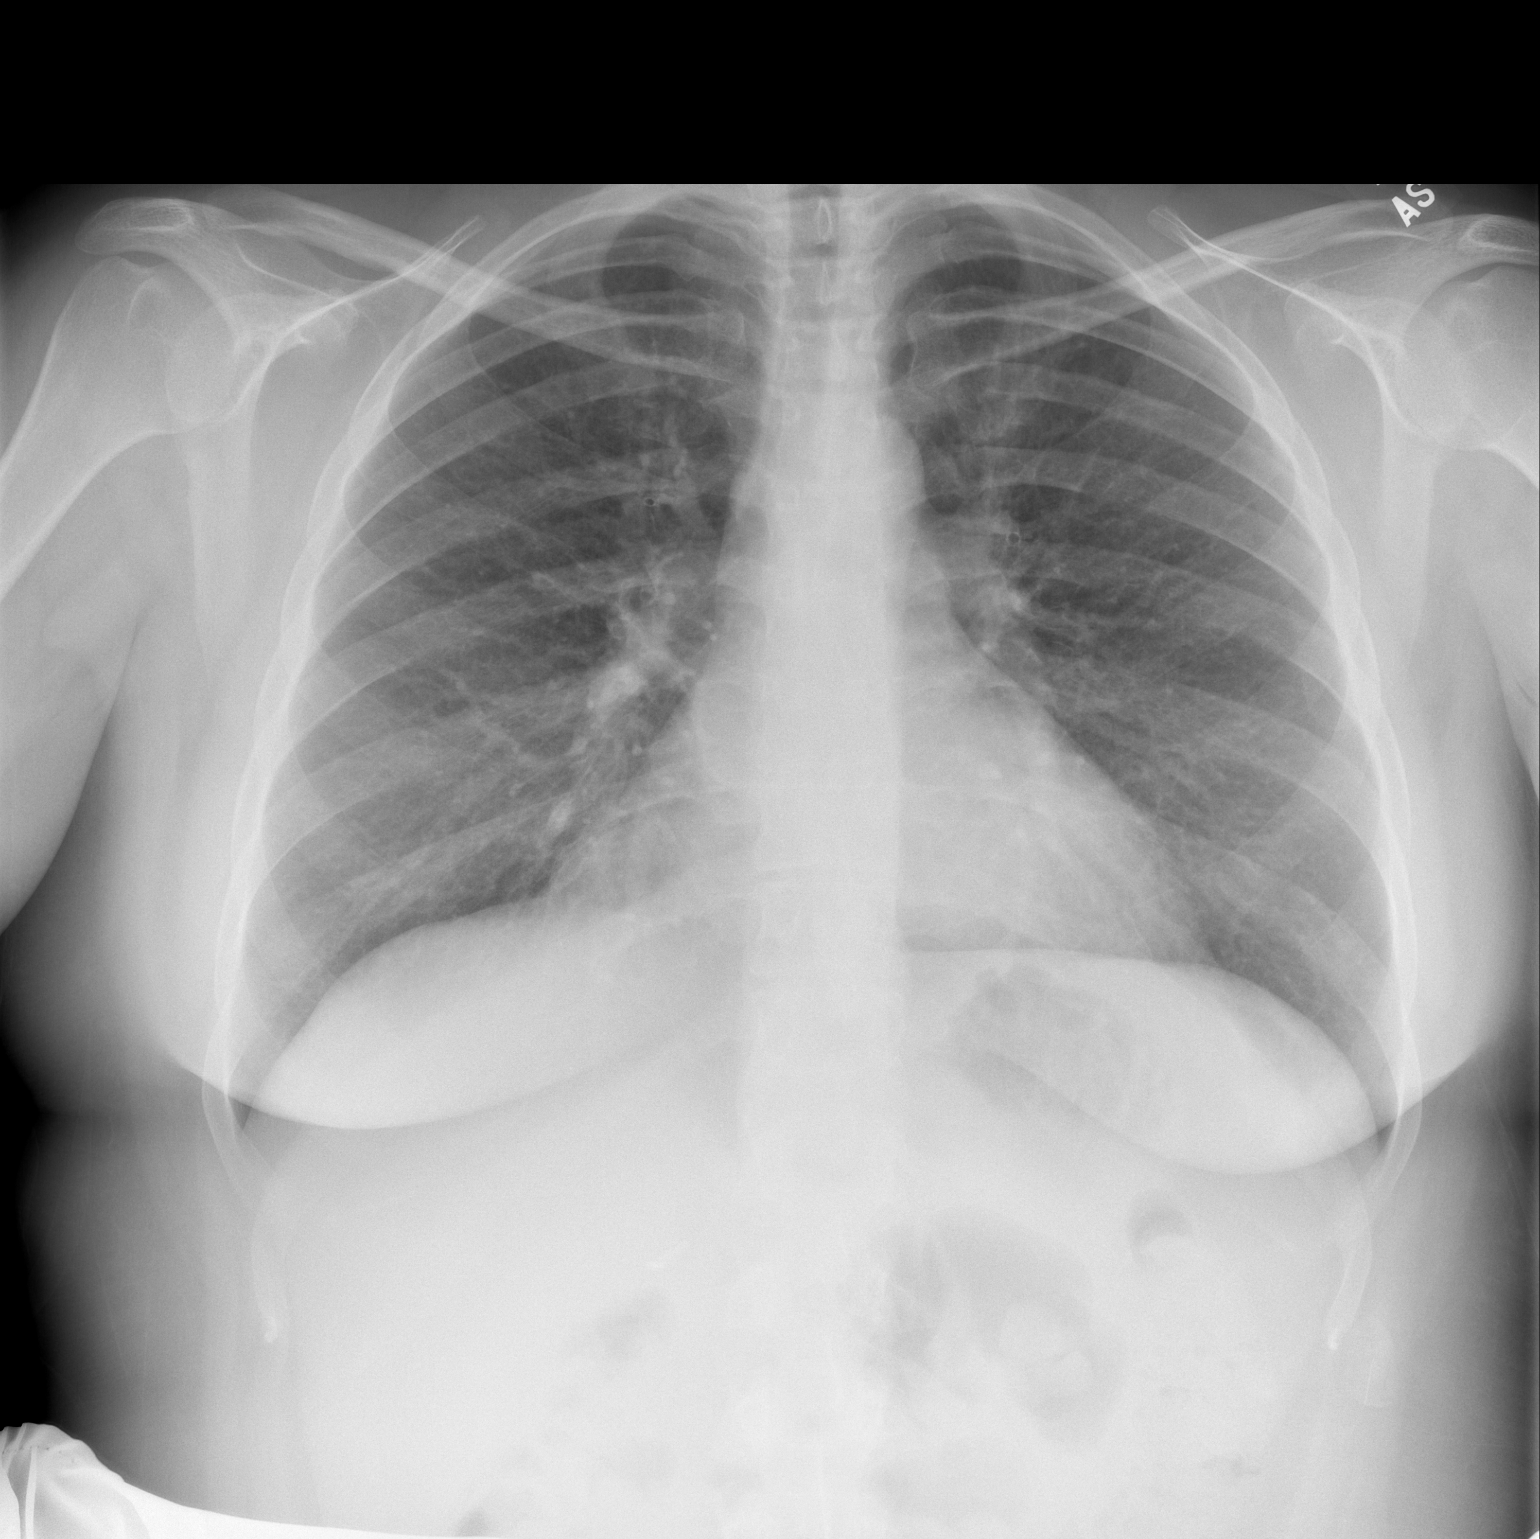

[w chest lat]
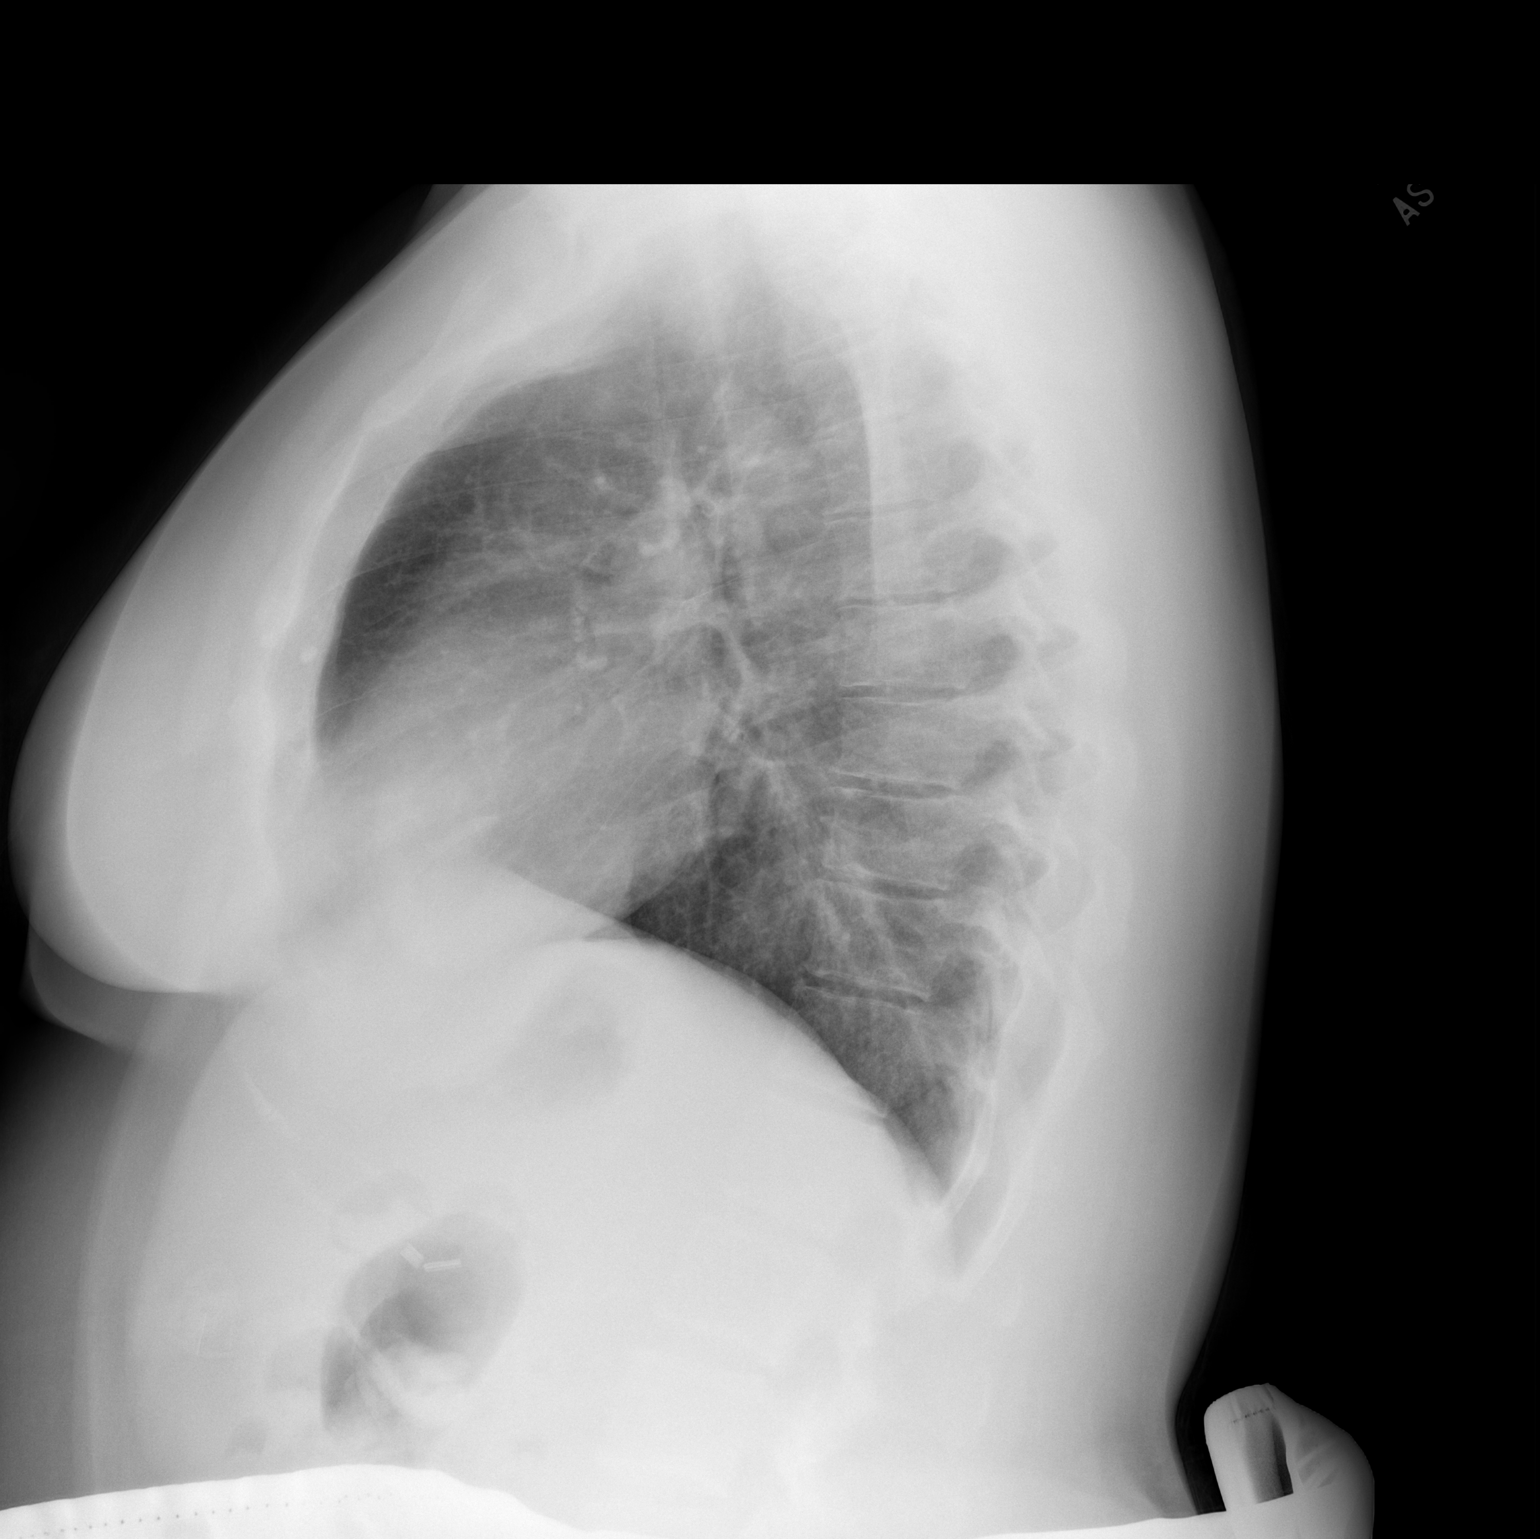

[2 of 2 positions shown; findings below may reference images not displayed]

FINDINGS: The heart size and mediastinal contours are within normal limits.
Both lungs are clear. No pneumothorax or pleural effusion is noted.
The visualized skeletal structures are unremarkable.
IMPRESSION: No active cardiopulmonary disease.

## 2018-03-16 DIAGNOSIS — F17200 Nicotine dependence, unspecified, uncomplicated: Secondary | ICD-10-CM | POA: Diagnosis not present

## 2018-03-16 DIAGNOSIS — F419 Anxiety disorder, unspecified: Secondary | ICD-10-CM | POA: Diagnosis not present

## 2018-03-16 DIAGNOSIS — Z13220 Encounter for screening for lipoid disorders: Secondary | ICD-10-CM | POA: Diagnosis not present

## 2018-03-16 DIAGNOSIS — Z713 Dietary counseling and surveillance: Secondary | ICD-10-CM | POA: Diagnosis not present

## 2018-03-16 DIAGNOSIS — F322 Major depressive disorder, single episode, severe without psychotic features: Secondary | ICD-10-CM | POA: Diagnosis not present

## 2018-03-16 DIAGNOSIS — E109 Type 1 diabetes mellitus without complications: Secondary | ICD-10-CM | POA: Diagnosis not present

## 2018-03-16 DIAGNOSIS — L732 Hidradenitis suppurativa: Secondary | ICD-10-CM | POA: Diagnosis not present

## 2018-03-16 DIAGNOSIS — K219 Gastro-esophageal reflux disease without esophagitis: Secondary | ICD-10-CM | POA: Diagnosis not present

## 2018-03-16 DIAGNOSIS — Z0000 Encounter for general adult medical examination without abnormal findings: Secondary | ICD-10-CM | POA: Diagnosis not present

## 2018-03-22 ENCOUNTER — Other Ambulatory Visit (INDEPENDENT_AMBULATORY_CARE_PROVIDER_SITE_OTHER): Payer: 59

## 2018-03-22 ENCOUNTER — Other Ambulatory Visit: Payer: Self-pay | Admitting: Pulmonary Disease

## 2018-03-22 DIAGNOSIS — Z13220 Encounter for screening for lipoid disorders: Secondary | ICD-10-CM

## 2018-03-22 DIAGNOSIS — E559 Vitamin D deficiency, unspecified: Secondary | ICD-10-CM

## 2018-03-22 DIAGNOSIS — Z0000 Encounter for general adult medical examination without abnormal findings: Secondary | ICD-10-CM | POA: Diagnosis not present

## 2018-03-22 LAB — CBC
HEMATOCRIT: 41.9 % (ref 36.0–46.0)
Hemoglobin: 14.4 g/dL (ref 12.0–15.0)
MCHC: 34.4 g/dL (ref 30.0–36.0)
MCV: 94 fl (ref 78.0–100.0)
Platelets: 320 10*3/uL (ref 150.0–400.0)
RBC: 4.46 Mil/uL (ref 3.87–5.11)
RDW: 13.1 % (ref 11.5–15.5)
WBC: 7.2 10*3/uL (ref 4.0–10.5)

## 2018-03-22 LAB — LIPID PANEL
CHOL/HDL RATIO: 3
Cholesterol: 193 mg/dL (ref 0–200)
HDL: 62.8 mg/dL (ref >39.00)
LDL CALC: 113 mg/dL — AB (ref 0–99)
NonHDL: 130.54
TRIGLYCERIDES: 90 mg/dL (ref 0.0–149.0)
VLDL: 18 mg/dL (ref 0.0–40.0)

## 2018-03-22 LAB — COMPREHENSIVE METABOLIC PANEL
ALBUMIN: 3.9 g/dL (ref 3.5–5.2)
ALK PHOS: 89 U/L (ref 39–117)
ALT: 12 U/L (ref 0–35)
AST: 12 U/L (ref 0–37)
BUN: 13 mg/dL (ref 6–23)
CALCIUM: 9.2 mg/dL (ref 8.4–10.5)
CHLORIDE: 104 meq/L (ref 96–112)
CO2: 25 mEq/L (ref 19–32)
CREATININE: 0.68 mg/dL (ref 0.40–1.20)
GFR: 103.74 mL/min (ref >60.00)
Glucose, Bld: 139 mg/dL — ABNORMAL HIGH (ref 70–99)
Potassium: 3.8 mEq/L (ref 3.5–5.1)
Sodium: 139 mEq/L (ref 135–145)
Total Bilirubin: 0.4 mg/dL (ref 0.2–1.2)
Total Protein: 6.6 g/dL (ref 6.0–8.3)

## 2018-03-22 LAB — VITAMIN D 25 HYDROXY (VIT D DEFICIENCY, FRACTURES): VITD: 23.71 ng/mL — ABNORMAL LOW (ref 30.00–100.00)

## 2018-03-22 LAB — TSH: TSH: 1.43 u[IU]/mL (ref 0.35–4.50)

## 2018-03-29 IMAGING — DX DG CHEST 2V
2 series · 2 of 2 positions shown · non-contrast
Comparison: 09/03/2016

CLINICAL DATA: Sarcoidosis

EXAM:
CHEST  2 VIEW

[chest pa]
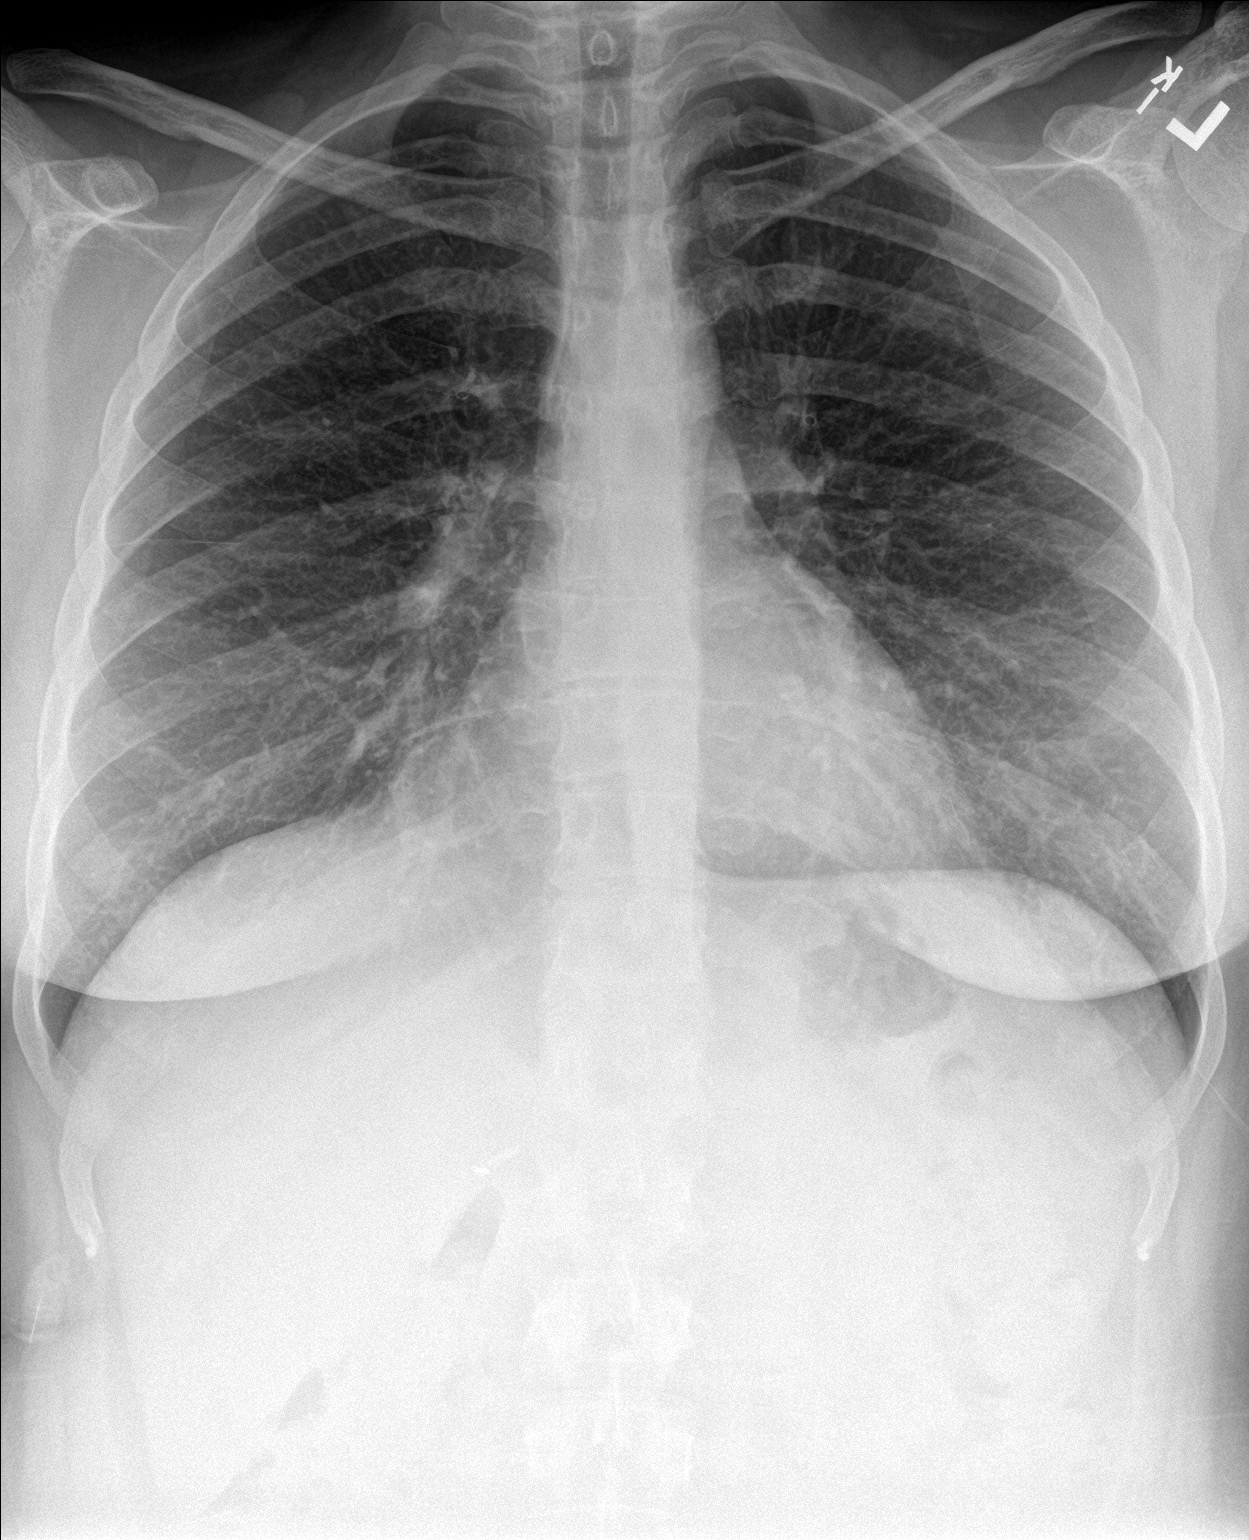

[chest lat]
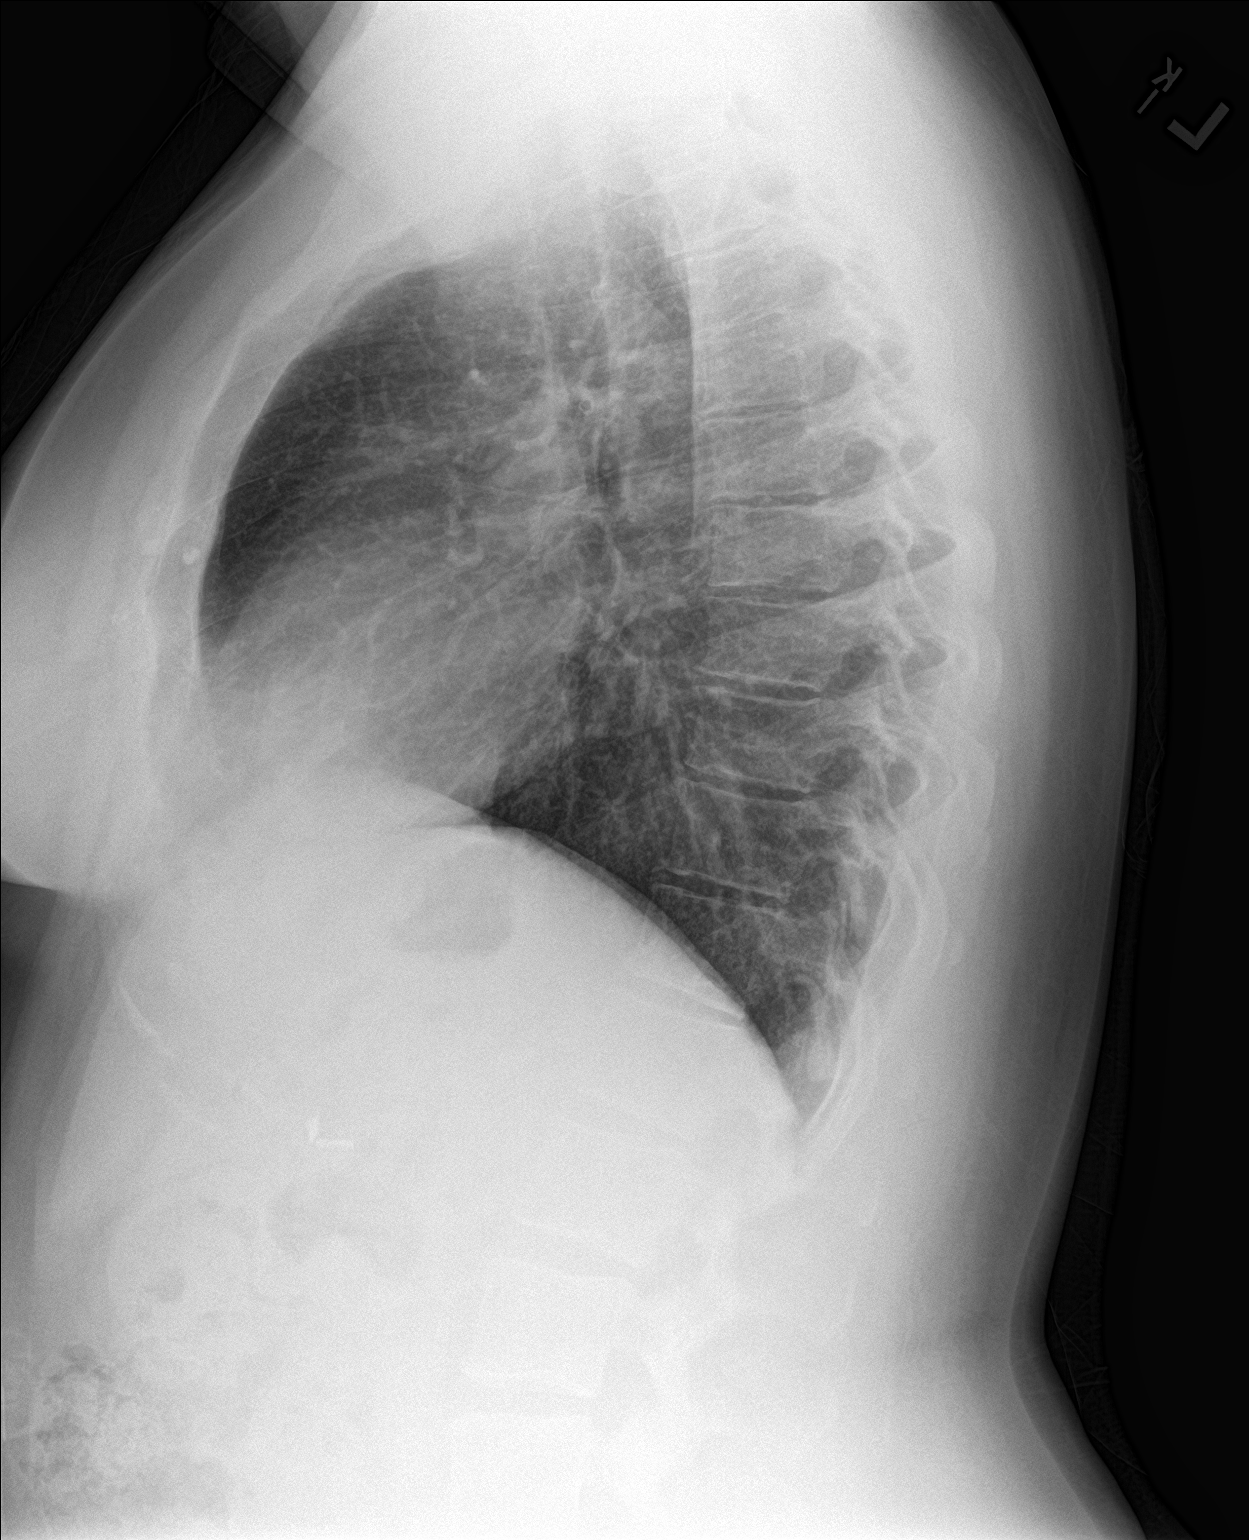

[2 of 2 positions shown; findings below may reference images not displayed]

FINDINGS: Heart and mediastinal contours are within normal limits. No focal
opacities or effusions. No acute bony abnormality.
IMPRESSION: No active cardiopulmonary disease.

## 2018-04-23 ENCOUNTER — Telehealth: Payer: Self-pay | Admitting: Internal Medicine

## 2018-04-23 MED ORDER — AZITHROMYCIN 250 MG PO TABS: ORAL_TABLET | ORAL | 0 refills | Status: DC

## 2018-04-23 NOTE — Telephone Encounter (Signed)
Asks Z pak for acute maxillary sinusitis and bronchitis, yellow Plan Zpak to [facility]

## 2018-04-27 ENCOUNTER — Other Ambulatory Visit: Payer: Self-pay | Admitting: Pulmonary Disease

## 2018-04-27 DIAGNOSIS — E108 Type 1 diabetes mellitus with unspecified complications: Secondary | ICD-10-CM

## 2018-05-12 DIAGNOSIS — Z30433 Encounter for removal and reinsertion of intrauterine contraceptive device: Secondary | ICD-10-CM | POA: Diagnosis not present

## 2018-05-31 ENCOUNTER — Other Ambulatory Visit: Payer: Self-pay

## 2018-05-31 ENCOUNTER — Emergency Department (HOSPITAL_COMMUNITY)
Admission: EM | Admit: 2018-05-31 | Discharge: 2018-05-31 | Disposition: A | Discharge status: Home / Self Care | Payer: 59 | Attending: Emergency Medicine | Admitting: Emergency Medicine

## 2018-05-31 ENCOUNTER — Other Ambulatory Visit: Payer: Self-pay | Admitting: Pulmonary Disease

## 2018-05-31 ENCOUNTER — Emergency Department (HOSPITAL_COMMUNITY): Payer: 59

## 2018-05-31 ENCOUNTER — Encounter (HOSPITAL_COMMUNITY): Payer: Self-pay | Admitting: Emergency Medicine

## 2018-05-31 DIAGNOSIS — E1065 Type 1 diabetes mellitus with hyperglycemia: Secondary | ICD-10-CM | POA: Diagnosis not present

## 2018-05-31 DIAGNOSIS — R002 Palpitations: Secondary | ICD-10-CM | POA: Diagnosis not present

## 2018-05-31 DIAGNOSIS — R739 Hyperglycemia, unspecified: Secondary | ICD-10-CM

## 2018-05-31 DIAGNOSIS — I10 Essential (primary) hypertension: Secondary | ICD-10-CM | POA: Insufficient documentation

## 2018-05-31 DIAGNOSIS — R000 Tachycardia, unspecified: Secondary | ICD-10-CM

## 2018-05-31 DIAGNOSIS — Z794 Long term (current) use of insulin: Secondary | ICD-10-CM | POA: Insufficient documentation

## 2018-05-31 DIAGNOSIS — R0789 Other chest pain: Secondary | ICD-10-CM | POA: Insufficient documentation

## 2018-05-31 LAB — BASIC METABOLIC PANEL
Anion gap: 14 (ref 5–15)
Anion gap: 11 (ref 5–15)
BUN: 17 mg/dL (ref 6–20)
BUN: 16 mg/dL (ref 6–20)
CHLORIDE: 101 mmol/L (ref 98–111)
CO2: 18 mmol/L — ABNORMAL LOW (ref 22–32)
CO2: 23 mmol/L (ref 22–32)
CREATININE: 1.01 mg/dL — AB (ref 0.44–1.00)
CREATININE: 0.78 mg/dL (ref 0.44–1.00)
Calcium: 9.5 mg/dL (ref 8.9–10.3)
Calcium: 9.1 mg/dL (ref 8.9–10.3)
Chloride: 105 mmol/L (ref 98–111)
GFR calc Af Amer: >60 mL/min (ref >60)
GFR calc non Af Amer: >60 mL/min (ref >60)
GFR calc non Af Amer: >60 mL/min (ref >60)
GLUCOSE: 405 mg/dL — AB (ref 70–99)
GLUCOSE: 212 mg/dL — AB (ref 70–99)
POTASSIUM: 4 mmol/L (ref 3.5–5.1)
Potassium: 3.9 mmol/L (ref 3.5–5.1)
SODIUM: 133 mmol/L — AB (ref 135–145)
SODIUM: 139 mmol/L (ref 135–145)

## 2018-05-31 LAB — CBC
HEMATOCRIT: 47.3 % — AB (ref 36.0–46.0)
Hemoglobin: 15.9 g/dL — ABNORMAL HIGH (ref 12.0–15.0)
MCH: 31.9 pg (ref 26.0–34.0)
MCHC: 33.6 g/dL (ref 30.0–36.0)
MCV: 95 fL (ref 78.0–100.0)
PLATELETS: 326 10*3/uL (ref 150–400)
RBC: 4.98 MIL/uL (ref 3.87–5.11)
RDW: 11.7 % (ref 11.5–15.5)
WBC: 8.9 10*3/uL (ref 4.0–10.5)

## 2018-05-31 LAB — CBG MONITORING, ED
GLUCOSE-CAPILLARY: 402 mg/dL — AB (ref 70–99)
GLUCOSE-CAPILLARY: 217 mg/dL — AB (ref 70–99)

## 2018-05-31 LAB — I-STAT BETA HCG BLOOD, ED (MC, WL, AP ONLY): I-stat hCG, quantitative: <5 m[IU]/mL (ref <5)

## 2018-05-31 LAB — URINALYSIS, ROUTINE W REFLEX MICROSCOPIC
BACTERIA UA: NONE SEEN
BILIRUBIN URINE: NEGATIVE
Glucose, UA: >=500 mg/dL — AB
Ketones, ur: 20 mg/dL — AB
LEUKOCYTES UA: NEGATIVE
NITRITE: NEGATIVE
PROTEIN: 30 mg/dL — AB
Specific Gravity, Urine: 1.039 — ABNORMAL HIGH (ref 1.005–1.030)
pH: 5 (ref 5.0–8.0)

## 2018-05-31 LAB — I-STAT TROPONIN, ED
TROPONIN I, POC: 0.02 ng/mL (ref 0.00–0.08)
Troponin i, poc: 0.01 ng/mL (ref 0.00–0.08)

## 2018-05-31 LAB — MAGNESIUM: MAGNESIUM: 1.7 mg/dL (ref 1.7–2.4)

## 2018-05-31 MED ORDER — ONDANSETRON HCL 4 MG/2ML IJ SOLN
4.0000 mg | Freq: Once | INTRAMUSCULAR | Status: AC
  Administered 2018-05-31: 4 mg via INTRAVENOUS
  Filled 2018-05-31: qty 2

## 2018-05-31 MED ORDER — SODIUM CHLORIDE 0.9 % IV BOLUS
1000.0000 mL | Freq: Once | INTRAVENOUS | Status: AC
  Administered 2018-05-31: 1000 mL via INTRAVENOUS

## 2018-05-31 NOTE — Progress Notes (Signed)
Inpatient Diabetes Program Recommendations  AACE/ADA: New Consensus Statement on Inpatient Glycemic Control (2015)  Target Ranges:  Prepandial:   less than 140 mg/dL      Peak postprandial:   less than 180 mg/dL (1-2 hours)      Critically ill patients:  140 - 180 mg/dL   Lab Results  Component Value Date   GLUCAP 217 (H) 05/31/2018   HGBA1C 11.4 (H) 12/25/2017    Review of Glycemic Control Results for [name], [name] (MRN [id number]) as of 05/31/2018 15:20  Ref. Range 05/31/2018 10:33 05/31/2018 14:09  Glucose-Capillary Latest Ref Range: 70 - 99 mg/dL 402 (H) 217 (H)   Diabetes history: Type 1 DM (requires basal and meal coverage) Outpatient Diabetes medications: Lantus 50 units QHS, Humalog SSI TID Current orders for Inpatient glycemic control: none  Inpatient Diabetes Program Recommendations:    If to remain inpatient consider:  - Add on A1C, last one from 12/25/17.  - Add basal insulin. Lantus 40 units QD. - Add Novolog 3 units TID (assuming patient is consuming >50% of meal). - Novolog 0-9 units TID under glycemic control order set.  Spoke with patient regarding outpatient control. Patient had run out of the supplies for her Medtronic insulin pump, and sent the pump back to the manufacturer? Regardless, she reverted back to her previous basal/bolus regimen and has needed supplies. She takes her basal insulin the AM. Patient sees Dr Hermelinda Dellen, endocrinology. Her next appointment is scheduled for October, 2019.  Will continue to follow.  PA at bedside, discussed plan with patient for repeat CMP.    Thanks, Bronson Curb, MSN, RNC-OB Diabetes Coordinator [phone] (8a-5p)

## 2018-05-31 NOTE — ED Provider Notes (Signed)
[facility] EMERGENCY DEPARTMENT Provider Note   CSN: [id number] Arrival date & time: 05/31/18  1023     History   Chief Complaint Chief Complaint  Patient presents with  . Abdominal Pain  . Palpitations  . Hyperglycemia    HPI [name] is a 37 y.o. female.  [name] is a 37 y.o. Female with a history of type 1 diabetes, SVT, PCOS, hidradenitis, migraines and asthma, who presents to the emergency department for evaluation of hyperglycemia, palpitations and abdominal discomfort.  Patient reports this morning when she got to work she started to have palpitations and her heart got up to the 160s, this felt like her previous episode of SVT.  She denies chest pain associated with this did have some mild shortness of breath.  He became nauseated and had two episodes of vomiting and after this her heart rate seemed to slow down.  She reports since then she has had some intermittent abdominal discomfort.  Patient reports when she started feeling unwell at work today she noted that her blood sugar was in the 400s, but this morning before taking her insulin it was in the 200s.  She reports she did not have any sugary foods and had dry toast for breakfast.  She has not had any fevers or chills, no cough or upper respiratory symptoms.  No chest pain or shortness of breath.  She has had some intermittent left flank pain but this is not sharp in nature, and she denies dysuria or urinary frequency.  With her symptoms today she did not have any syncopal episode.  No headache, numbness or weakness.     Past Medical History:  Diagnosis Date  . Abscess of left axilla - PREVOTELLA BIVIA & Staph Coag Neg 07/12/2012   MODERATE PREVOTELLA BIVIA Note: BETA LACTAMASE NEGATIVE    . Asthma    as a child  . Cancer ([place])    skin tag rt breast  . Cellulitis 06/01/2014   RT INNER THIGH  . Depression    hx  . Diabetes ([place])   . Diabetes mellitus    IDDM, Insulin pump; followed  by Dr. [name]  . GERD (gastroesophageal reflux disease)    no current meds.  . Heart murmur   . Hidradenitis 04/2012   bilat. thighs, left groin - open areas on thighs  . Hx MRSA infection   . Hydradenitis   . PCOS (polycystic ovarian syndrome)   . Pneumonia    hx  . SVT (supraventricular tachycardia) ([place]) 06/2017  . Vitamin D insufficiency     Patient Active Problem List   Diagnosis Date Noted  . Enteritis 12/24/2017  . Intractable nausea and vomiting 12/24/2017  . Epigastric pain   . Other chest pain 09/28/2017  . TIA (transient ischemic attack) 09/28/2017  . Paroxysmal SVT (supraventricular tachycardia) ([place]) 06/24/2017  . Dizziness 06/24/2017  . Weakness 06/24/2017  . Hyperglycemia 03/17/2016  . Abscess 03/17/2016  . Chest discomfort 07/10/2014  . Cellulitis 06/01/2014  . Cellulitis of right thigh 06/01/2014  . Postop check 01/19/2014  . Smoking addiction 08/29/2013  . DKA, type 1 ([place]) 05/09/2013  . Hidradenitis suppurativa 04/21/2012  . Esophageal reflux 11/17/2011  . Seasonal and perennial allergic rhinitis 05/20/2010  . BRONCHITIS, ACUTE 11/29/2007  . HYPONATREMIA 11/14/2007  . ANXIETY STATE, UNSPECIFIED 10/24/2007  . Sinus tachycardia 07/07/2007  . Diabetes mellitus type I ([place]) 04/08/2007  . Smoker 04/08/2007  . Depression 04/08/2007  . 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 MIGRAINES, HX OF 04/08/2007    Past Surgical History:  Procedure Laterality Date  . BREAST SURGERY     right lumpectomy  . BUNIONECTOMY     left  . CARDIAC CATHETERIZATION    . CHOLECYSTECTOMY  12/02/2006   lap. chole.  Marland Kitchen DILATION AND EVACUATION  02/14/2007  . ESOPHAGOGASTRODUODENOSCOPY  11/17/2011   Procedure: ESOPHAGOGASTRODUODENOSCOPY (EGD);  Surgeon: [name], MD;  Location: [facility];  Service: Endoscopy;  Laterality: N/A;  . EYE SURGERY     exc. stye left eye  . HYDRADENITIS EXCISION  04/30/2012   Procedure: EXCISION HYDRADENITIS GROIN;  Surgeon:  [name], MD;  Location: [facility];  Service: General;  Laterality: Left;  wide excision hidradenitis bilateral thighs and Left groin  . HYDRADENITIS EXCISION Left 01/13/2014   Procedure: WIDE EXCISION HIDRADENITIS LEFT AXILLA;  Surgeon: [name], MD;  Location: [facility];  Service: General;  Laterality: Left;  . IRRIGATION AND DEBRIDEMENT ABSCESS Right 06/02/2014   Procedure: IRRIGATION AND DEBRIDEMENT ABSCESS;  Surgeon: [name], MD;  Location: [facility];  Service: General;  Laterality: Right;  . IRRIGATION AND DEBRIDEMENT ABSCESS Left 09/23/2014   Procedure: IRRIGATION AND DEBRIDEMENT ABSCESS/LEFT THIGH;  Surgeon: [name], MD;  Location: [place];  Service: General;  Laterality: Left;  . LEFT HEART CATHETERIZATION WITH CORONARY ANGIOGRAM N/A 07/14/2014   Procedure: LEFT HEART CATHETERIZATION WITH CORONARY ANGIOGRAM;  Surgeon: [name], MD;  Location: [facility];  Service: Cardiovascular;  Laterality: N/A;  . TONSILLECTOMY    . WISDOM TOOTH EXTRACTION       OB History    Gravida  4   Para      Term      Preterm      AB  3   Living  1     SAB  3   TAB      Ectopic      Multiple      Live Births               Home Medications    Prior to Admission medications   Medication Sig Start Date End Date Taking? Authorizing Provider  ALPRAZolam (XANAX) 0.25 MG tablet Take 0.25 mg by mouth 2 (two) times daily as needed for anxiety.  08/21/17   [provider]  azithromycin (ZITHROMAX) 250 MG tablet 2 today then one daily 04/23/18   Baird Lyons D, MD  doxycycline (VIBRA-TABS) 100 MG tablet Take 1 tablet (100 mg total) by mouth 2 (two) times daily. Patient taking differently: Take 100 mg by mouth 2 (two) times daily.  03/19/16   Debbe Odea, MD  folic acid (FOLVITE) 1 MG tablet Take 1 mg by mouth daily. 10/13/17   [provider]  glucose blood ([employer] TEST STRIPS) test strip 1 strip by Misc.(Non-Drug; Combo Route)  route 4 times daily with meals and nightly. contour next glucose test strips 08/04/16   [provider]  HUMALOG 100 UNIT/ML injection  07/31/17   [provider]  insulin glargine (LANTUS) 100 UNIT/ML injection Inject 50 Units into the skin at bedtime. Reported on 03/21/2016 05/10/13   Charlynne Cousins, MD  Insulin Human (INSULIN PUMP) SOLN Inject into the skin continuous. *Novolog*    [provider]  levonorgestrel (MIRENA) 20 MCG/24HR IUD 1 each by Intrauterine route once. Placed 04/2013    [provider]  methotrexate 2.5 MG tablet Take 2.5 mg by mouth once a week.  10/13/17   [provider]  mupirocin ointment (BACTROBAN) 2 % Apply 1 application topically daily as needed. 10/13/17   [provider]  ondansetron (ZOFRAN) 4 MG tablet 1 tab every 4-8 hours for nausea 12/22/17   Baird Lyons D, MD  pantoprazole (PROTONIX) 40 MG tablet Take 1 tablet (40 mg total) by mouth daily. 02/18/18   Deneise Lever, MD  saccharomyces boulardii (FLORASTOR) 250 MG capsule Take 1 capsule (250 mg total) by mouth 2 (two) times daily. 12/27/17   [name], MD  spironolactone (ALDACTONE) 100 MG tablet Take 1 tablet (100 mg total) by mouth daily. 04/13/17   Baird Lyons D, MD  varenicline (CHANTIX PAK) 0.5 MG X 11 & 1 MG X 42 tablet Take one 0.5 mg tablet by mouth once daily for 3 days, then increase to one 0.5 mg tablet twice daily for 4 days, then increase to one 1 mg tablet twice daily. 12/29/17   Deneise Lever, MD  VIIBRYD 40 MG TABS TAKE 1 TABLET BY MOUTH ONCE DAILY 06/08/17   Deneise Lever, MD  Vilazodone HCl (VIIBRYD) 40 MG TABS Take 1 tablet (40 mg total) by mouth daily. 01/20/18   Deneise Lever, MD    Family History Family History  Problem Relation Age of Onset  . Hyperlipidemia Sister   . Hypertension Sister   . Hypertension Father   . Stroke Paternal Uncle     Social History Social History   Tobacco Use  . Smoking status: Former Smoker      Packs/day: 0.25    Years: 14.00    Pack years: 3.50    Types: Cigarettes  . Smokeless tobacco: Never Used  . Tobacco comment: quit 2 weeks ago  Substance Use Topics  . Alcohol use: No    Alcohol/week: 0.0 standard drinks  . Drug use: No     Allergies   Latex; Levofloxacin; Moxifloxacin; Oxycodone-acetaminophen; Peach [prunus persica]; Potassium-containing compounds; Prednisone; Propoxyphene n-acetaminophen; Rosiglitazone maleate; Xolair [omalizumab]; Adhesive [tape]; Morphine and related; Prozac [fluoxetine hcl]; Chantix [varenicline]; Citrullus vulgaris; Clindamycin/lincomycin; Humira [adalimumab]; Septra [sulfamethoxazole-trimethoprim]; Cefaclor; Keflex [cephalexin]; Promethazine hcl; and Sulfadiazine   Review of Systems Review of Systems  Constitutional: Negative for chills and fever.  HENT: Negative.   Eyes: Negative for visual disturbance.  Respiratory: Positive for shortness of breath.   Cardiovascular: Positive for palpitations. Negative for chest pain and leg swelling.  Gastrointestinal: Positive for abdominal pain. Negative for nausea and vomiting.  Genitourinary: Positive for flank pain. Negative for dysuria, frequency and hematuria.  Musculoskeletal: Negative for arthralgias, back pain and myalgias.  Skin: Negative for color change, rash and wound.  Neurological: Negative for dizziness, syncope, light-headedness and headaches.     Physical Exam Updated Vital Signs BP 107/71   Pulse 88   Temp (!) 97.4 F (36.3 C) (Axillary)   Resp 18   SpO2 100%   Physical Exam  Constitutional: She is oriented to person, place, and time. She appears well-developed and well-nourished. No distress.  HENT:  Head: Normocephalic and atraumatic.  Mouth/Throat: Oropharynx is clear and moist.  Eyes: Pupils are equal, round, and reactive to light. EOM are normal. Right eye exhibits no discharge. Left eye exhibits no discharge.  Neck: Neck supple.  Cardiovascular: Normal rate,  regular rhythm, normal heart sounds and intact distal pulses.  Pulmonary/Chest: Effort normal and breath sounds normal. No respiratory distress. She has no wheezes. She has no rales.  Respirations equal and unlabored, patient able to speak in full sentences,  lungs clear to auscultation bilaterally  Abdominal: Soft. Normal appearance and bowel sounds are normal. She exhibits no distension and no mass. There is no tenderness. There is no guarding.  Abdomen soft, nondistended, nontender to palpation in all quadrants without guarding or peritoneal signs, minimal tenderness over left flank, none on right  Musculoskeletal: She exhibits no edema or deformity.  Neurological: She is alert and oriented to person, place, and time. Coordination normal.  Speech is clear, able to follow commands CN III-XII intact Normal strength in upper and lower extremities bilaterally including dorsiflexion and plantar flexion, strong and equal grip strength Sensation normal to light and sharp touch Moves extremities without ataxia, coordination intact  Skin: Skin is warm and dry. Capillary refill takes less than 2 seconds. She is not diaphoretic.  Nursing note and vitals reviewed.    ED Treatments / Results  Labs (all labs ordered are listed, but only abnormal results are displayed) Labs Reviewed  BASIC METABOLIC PANEL - Abnormal; Notable for the following components:      Result Value   Sodium 133 (*)    CO2 18 (*)    Glucose, Bld 405 (*)    Creatinine, Ser 1.01 (*)    All other components within normal limits  CBC - Abnormal; Notable for the following components:   Hemoglobin 15.9 (*)    HCT 47.3 (*)    All other components within normal limits  URINALYSIS, ROUTINE W REFLEX MICROSCOPIC - Abnormal; Notable for the following components:   Specific Gravity, Urine 1.039 (*)    Glucose, UA >=500 (*)    Hgb urine dipstick SMALL (*)    Ketones, ur 20 (*)    Protein, ur 30 (*)    All other components within  normal limits  BASIC METABOLIC PANEL - Abnormal; Notable for the following components:   Glucose, Bld 212 (*)    All other components within normal limits  CBG MONITORING, ED - Abnormal; Notable for the following components:   Glucose-Capillary 402 (*)    All other components within normal limits  CBG MONITORING, ED - Abnormal; Notable for the following components:   Glucose-Capillary 217 (*)    All other components within normal limits  URINE CULTURE  MAGNESIUM  I-STAT TROPONIN, ED  I-STAT BETA HCG BLOOD, ED (MC, [facility], AP ONLY)  I-STAT TROPONIN, ED    EKG EKG Interpretation  Date/Time:  Monday May 31 2018 10:28:54 EDT Ventricular Rate:  104 PR Interval:  130 QRS Duration: 88 QT Interval:  308 QTC Calculation: 405 R Axis:   58 Text Interpretation:  Sinus tachycardia Nonspecific T wave abnormality Abnormal ECG When compared to prior, faster rate.  no STEMI Confirmed by Antony Blackbird [phone]) on 05/31/2018 12:38:49 PM   Radiology Dg Chest 2 View  Result Date: 05/31/2018 CLINICAL DATA:  Heart palpitations. EXAM: CHEST - 2 VIEW COMPARISON:  Body CT 12/27/2017 FINDINGS: Cardiomediastinal silhouette is normal. Mediastinal contours appear intact. There is no evidence of focal airspace consolidation, pleural effusion or pneumothorax. Osseous structures are without acute abnormality. Soft tissues are grossly normal. IMPRESSION: No active cardiopulmonary disease. Electronically Signed   By: Fidela Salisbury M.D.   On: 05/31/2018 11:40    Procedures Procedures (including critical care time)  Medications Ordered in ED Medications  sodium chloride 0.9 % bolus 1,000 mL (0 mLs Intravenous Stopped 05/31/18 1419)  ondansetron (ZOFRAN) injection 4 mg (4 mg Intravenous Given 05/31/18 1327)     Initial Impression / Assessment and Plan / ED Course  I have reviewed the triage vital signs and the nursing notes.  Pertinent labs & imaging results that were available during my care of the  patient were reviewed by me and considered in my medical decision making (see chart for details).  Patient presents for evaluation of hyperglycemia, palpitations and abdominal discomfort.  She had episode of palpitations this morning at work where her heart got up to the 160s, has history of SVT and this felt exactly like that.  After 2 episodes of vomiting heart seem to correct, since then she has had no chest pain or shortness of breath and no further palpitations.  She also reports some intermittent left flank pain and abdominal cramping, has not had any further nausea or vomiting, no urinary symptoms.  No syncopal episode associated with her symptoms this morning.  Patient did note that her blood sugar seems to be in the 200s when she got up this morning but even after taking her insulin at work when she started feeling ill today her sugar was noted to be in the 400s, no sugary meal for breakfast.  On arrival patient was minimally tachycardic at 105 but all other vitals stable.  EKG is unremarkable, troponins negative x2.  No leukocytosis, hemoglobin is slightly increased I suspect hemoconcentration from dehydration.  Initial metabolic panel shows a sugar of 405, with CO2 of 18, but no anion gap.  Mild hyponatremia at 133.  I suspect these electrolyte abnormalities are due to hyperglycemia, but she does not appear to be in DKA.  Magnesium is within normal limits as well.  Negative pregnancy.  Urinalysis shows small amount of ketones and protein, but no signs of infection, urine culture pending.  Chest x-ray shows no active cardiopulmonary disease.  1 L fluid bolus given.  Sugar has corrected nicely, now 643, repeat metabolic panel shows correction of CO2 and sodium.  Diabetes coordinator came to speak with patient today and they have addressed appropriate pan for blood sugar management at home and patient will follow up with her endocrinologist.  Her abdominal exam is benign with minimal flank discomfort  and she has no blood in her urine to suggest kidney stone and no signs of infection.  No further episodes of vomiting while here in the emergency department.  At this time I feel patient is stable for discharge home with close follow-up.  Return precautions discussed.  Patient expresses understanding and is in agreement with plan.  Final Clinical Impressions(s) / ED Diagnoses   Final diagnoses:  Hyperglycemia  Atypical chest pain    ED Discharge Orders    None       Jacqlyn Larsen, [place] 06/03/18 0011    Tegeler, Gwenyth Allegra, MD 06/03/18 [id number]

## 2018-05-31 NOTE — ED Notes (Signed)
Pt c/o left arm numbness and numbness to the left side of her body. Neuro checks within normal. No weakness, noted, no droop, drift, slurred speech.

## 2018-05-31 NOTE — ED Notes (Signed)
Pt. Ambulated to restroom with help.

## 2018-05-31 NOTE — ED Notes (Signed)
Pt. Stated that she is unable to provide urine sample at this time. Fiance asked to keep privacy shade.

## 2018-05-31 NOTE — Discharge Instructions (Signed)
Patient is reassuring today, heart rate has remained stable and your cardiac work-up does not suggest any acute issue with your heart today.  Your blood sugar has improved significantly and the rest of your electrolytes have corrected here in the emergency department.  Please continue with the insulin regimen as discussed with the diabetes educator today and call Dr. Hermelinda Dellen if still having issues getting sugar under control.  Return for chest pain, shortness of breath, continued palpitations, or very high blood glucose readings or any other new or concerning symptoms.

## 2018-05-31 NOTE — ED Triage Notes (Signed)
Pt reports her sugar was 201 this am, took her insulin, after getting to work began having abd pain and heart palpitations. Also endorses flank pain and painful urination as well.

## 2018-05-31 NOTE — ED Notes (Signed)
Pt CBG 402

## 2018-06-02 LAB — URINE CULTURE

## 2018-08-15 DIAGNOSIS — J189 Pneumonia, unspecified organism: Secondary | ICD-10-CM | POA: Diagnosis not present

## 2018-08-15 DIAGNOSIS — E860 Dehydration: Secondary | ICD-10-CM | POA: Diagnosis not present

## 2018-08-15 DIAGNOSIS — I471 Supraventricular tachycardia: Secondary | ICD-10-CM | POA: Diagnosis not present

## 2018-08-15 DIAGNOSIS — R1084 Generalized abdominal pain: Secondary | ICD-10-CM | POA: Diagnosis not present

## 2018-08-15 DIAGNOSIS — Z87891 Personal history of nicotine dependence: Secondary | ICD-10-CM | POA: Diagnosis not present

## 2018-08-15 DIAGNOSIS — E1010 Type 1 diabetes mellitus with ketoacidosis without coma: Secondary | ICD-10-CM | POA: Diagnosis not present

## 2018-08-15 DIAGNOSIS — R918 Other nonspecific abnormal finding of lung field: Secondary | ICD-10-CM | POA: Diagnosis not present

## 2018-08-15 DIAGNOSIS — Z880 Allergy status to penicillin: Secondary | ICD-10-CM | POA: Diagnosis not present

## 2018-08-15 DIAGNOSIS — R05 Cough: Secondary | ICD-10-CM | POA: Diagnosis not present

## 2018-08-15 DIAGNOSIS — R109 Unspecified abdominal pain: Secondary | ICD-10-CM | POA: Diagnosis not present

## 2018-08-15 DIAGNOSIS — Z888 Allergy status to other drugs, medicaments and biological substances status: Secondary | ICD-10-CM | POA: Diagnosis not present

## 2018-08-15 DIAGNOSIS — Z9641 Presence of insulin pump (external) (internal): Secondary | ICD-10-CM | POA: Diagnosis not present

## 2018-08-15 DIAGNOSIS — Z882 Allergy status to sulfonamides status: Secondary | ICD-10-CM | POA: Diagnosis not present

## 2018-08-15 DIAGNOSIS — J181 Lobar pneumonia, unspecified organism: Secondary | ICD-10-CM | POA: Diagnosis not present

## 2018-08-15 DIAGNOSIS — Z885 Allergy status to narcotic agent status: Secondary | ICD-10-CM | POA: Diagnosis not present

## 2018-08-15 DIAGNOSIS — R531 Weakness: Secondary | ICD-10-CM | POA: Diagnosis not present

## 2018-08-30 ENCOUNTER — Telehealth: Payer: Self-pay | Admitting: Internal Medicine

## 2018-08-30 MED ORDER — OSELTAMIVIR PHOSPHATE 75 MG PO CAPS: ORAL_CAPSULE | ORAL | 0 refills | Status: DC

## 2018-08-30 NOTE — Telephone Encounter (Signed)
Phone call- Daughter dx'd Influenza B. Ashya asks prophy Tamiflu Ordered

## 2018-08-31 ENCOUNTER — Telehealth: Payer: Self-pay | Admitting: Internal Medicine

## 2018-08-31 MED ORDER — PROMETHAZINE-CODEINE 6.25-10 MG/5ML PO SOLN: 5.0000 mL | Freq: Four times a day (QID) | ORAL | 0 refills | Status: DC | PRN

## 2018-08-31 NOTE — Telephone Encounter (Signed)
Asks cough syrup. Bad cough with flu syndrome Ordering prometh codeine-[facility] [place] She assures me charted allergy to codeine is inaccurate and she can take it.

## 2018-09-02 DIAGNOSIS — J189 Pneumonia, unspecified organism: Secondary | ICD-10-CM | POA: Diagnosis not present

## 2018-09-02 DIAGNOSIS — E1010 Type 1 diabetes mellitus with ketoacidosis without coma: Secondary | ICD-10-CM | POA: Diagnosis not present

## 2018-09-02 DIAGNOSIS — F419 Anxiety disorder, unspecified: Secondary | ICD-10-CM | POA: Diagnosis not present

## 2018-09-06 ENCOUNTER — Other Ambulatory Visit: Payer: Self-pay | Admitting: *Deleted

## 2018-09-06 ENCOUNTER — Other Ambulatory Visit: Payer: Self-pay | Admitting: Internal Medicine

## 2018-09-06 ENCOUNTER — Telehealth: Payer: Self-pay | Admitting: Internal Medicine

## 2018-09-06 MED ORDER — INSULIN GLARGINE 100 UNIT/ML ~~LOC~~ SOLN: SUBCUTANEOUS | 5 refills | Status: DC

## 2018-09-06 NOTE — Patient Outreach (Signed)
[facility]) Care Management  09/06/2018  ZAMYRA ALLENSWORTH 1981-08-02 [id number]   Case transitioned to Kelli Churn @ [facility] [facility] for Transition of care follow up.    Kobie Whidby H. Annia Friendly, BSN, [place] Management [facility] Telephonic CM Phone: [phone] Fax: [phone]

## 2018-09-06 NOTE — Telephone Encounter (Signed)
Called pt's pharmacy and spoke with [place] letting her know the frequency is once daily as directed by CY.  Daphane expressed understanding. Nothing further needed.

## 2018-09-06 NOTE — Telephone Encounter (Signed)
Once daily

## 2018-09-06 NOTE — Telephone Encounter (Signed)
Looked at the directions for the lantus Rx and on here, it states inject 60 units [name] as directed for 20ml to be dispensed with 5 RF.  Per pharmacy, they are needing to know frequency for medication. Dr. Annamaria Boots, please advise on this for pharmacy. Thanks!

## 2018-09-08 ENCOUNTER — Encounter: Payer: Self-pay | Admitting: *Deleted

## 2018-09-08 ENCOUNTER — Other Ambulatory Visit: Payer: Self-pay | Admitting: *Deleted

## 2018-09-08 NOTE — Patient Outreach (Signed)
[facility]) Care Management  09/08/2018  [name] 1980-09-25 [id number]   Subjective: Telephone call to patient's home / mobile number, spoke with patient, and HIPAA verified.  Discussed [facility] Care Management UMR Transition of care follow up, patient voiced understanding, and is in agreement to follow up.  Patient states she is feeling much better, working out on a consistent basis, losing weight, over strength improving,  pneumonia improving, has returned to work, had a follow up with primary MD on 09/02/18, and appointment went well.  States she has also followed up with pulmonologist at work, a home nebulizer has been ordered, and she is aware of how to use it.   States she is currently active with [facility] for diabetes disease management and her nurse [name] calls her as needed.  States she is continuing to work on work life balance and is in the process of scheduling a follow up appointment with her endocrinologist as soon as possible. States she has decreased the stressors in her life, recently broke up with boyfriend, and feels great about her decision.   Patient states se is able to manage self care and has assistance as needed.  Patient voices understanding of medical diagnosis and treatment plan.   Patient states her mother has a history of bilateral pulmonary embolus.  States she is accessing the following [facility] benefits: outpatient pharmacy, hospital indemnity (not chosen benefit), and has family medical leave act [employer]) in place.  Patient states she does not have any education material, transition of care, care coordination, transportation, community resource, or pharmacy needs at this time.  States she is very appreciative of the follow up and is in agreement to receive [place] Management information.     Objective: Per KPN (Knowledge Performance Now, point of care tool) and chart review, patient hospitalized 08/15/18 -07/2718 for Diabetic ketoacidosis at [facility].   Patient also has a history of pneumonia, Asthma, migraines, Hidradenitis, PCOS (polycystic ovarian syndrome), SVT (supraventricular tachycardia), TIA (transient ischemic attack), and Vitamin D insufficiency.        Assessment: Received UMR Transition of care referral on 08/23/18.   Transition of care follow up completed, no care management needs, and will proceed with case closure.      Plan: RNCM will send patient successful outreach letter, [facility] pamphlet, and magnet. RNCM will complete case closure due to follow up completed / no care management needs.       [name], BSN, [place] Management [facility] Telephonic CM Phone: [phone] Fax: [phone]

## 2018-09-21 ENCOUNTER — Telehealth: Payer: Self-pay | Admitting: Internal Medicine

## 2018-09-21 MED ORDER — DOXYCYCLINE HYCLATE 100 MG PO TABS: 100.0000 mg | ORAL_TABLET | Freq: Two times a day (BID) | ORAL | 0 refills | Status: DC

## 2018-09-21 NOTE — Telephone Encounter (Signed)
Developing sinusitis with migraine Plan- doxycycline sent

## 2018-10-04 ENCOUNTER — Other Ambulatory Visit: Payer: Self-pay | Admitting: Pulmonary Disease

## 2018-10-04 ENCOUNTER — Other Ambulatory Visit (INDEPENDENT_AMBULATORY_CARE_PROVIDER_SITE_OTHER): Payer: 59

## 2018-10-04 DIAGNOSIS — E109 Type 1 diabetes mellitus without complications: Secondary | ICD-10-CM

## 2018-10-04 LAB — HEMOGLOBIN A1C: Hgb A1c MFr Bld: 10.6 % — ABNORMAL HIGH (ref 4.6–6.5)

## 2018-10-04 NOTE — Progress Notes (Signed)
[id number]

## 2018-10-04 NOTE — Addendum Note (Signed)
Addended by: Suzzanne Cloud E on: 10/04/2018 10:50 AM   Modules accepted: Orders

## 2018-10-04 NOTE — Addendum Note (Signed)
Addended by: Suzzanne Cloud E on: 10/04/2018 10:49 AM   Modules accepted: Orders

## 2018-10-05 LAB — HCG, SERUM, QUALITATIVE: PREG SERUM: NEGATIVE

## 2018-10-18 DIAGNOSIS — Z5181 Encounter for therapeutic drug level monitoring: Secondary | ICD-10-CM | POA: Diagnosis not present

## 2018-10-18 DIAGNOSIS — L732 Hidradenitis suppurativa: Secondary | ICD-10-CM | POA: Diagnosis not present

## 2018-11-01 ENCOUNTER — Ambulatory Visit (INDEPENDENT_AMBULATORY_CARE_PROVIDER_SITE_OTHER): Payer: 59 | Admitting: Internal Medicine

## 2018-11-01 DIAGNOSIS — Z20828 Contact with and (suspected) exposure to other viral communicable diseases: Secondary | ICD-10-CM | POA: Diagnosis not present

## 2018-11-01 LAB — POCT INFLUENZA A/B
Influenza A, POC: NEGATIVE
Influenza B, POC: NEGATIVE

## 2018-11-01 NOTE — Progress Notes (Signed)
2/10/12020 Documentation only Flu virus expsosure. Malaise with some chill and myalgias. Did have flu vaccine. Nasal flu swab Negative. Imp- possible non-specific virus Plan- fluids, rest, observation, symptomatic therapies if needed.

## 2018-11-01 NOTE — Addendum Note (Signed)
Addended by: Valerie Salts on: 11/01/2018 11:01 AM   Modules accepted: Orders

## 2018-11-08 ENCOUNTER — Ambulatory Visit (HOSPITAL_COMMUNITY)
Admission: EM | Admit: 2018-11-08 | Discharge: 2018-11-08 | Disposition: A | Discharge status: Home / Self Care | Payer: 59 | Attending: Urgent Care | Admitting: Urgent Care

## 2018-11-08 ENCOUNTER — Encounter (HOSPITAL_COMMUNITY): Payer: Self-pay | Admitting: Emergency Medicine

## 2018-11-08 DIAGNOSIS — M778 Other enthesopathies, not elsewhere classified: Secondary | ICD-10-CM | POA: Diagnosis not present

## 2018-11-08 DIAGNOSIS — M25531 Pain in right wrist: Secondary | ICD-10-CM

## 2018-11-08 MED ORDER — KETOROLAC TROMETHAMINE 60 MG/2ML IM SOLN
60.0000 mg | Freq: Once | INTRAMUSCULAR | Status: AC
  Administered 2018-11-08: 60 mg via INTRAMUSCULAR

## 2018-11-08 MED ORDER — KETOROLAC TROMETHAMINE 60 MG/2ML IM SOLN
INTRAMUSCULAR | Status: AC
  Filled 2018-11-08: qty 2

## 2018-11-08 MED ORDER — MELOXICAM 7.5 MG PO TABS: 7.5000 mg | ORAL_TABLET | Freq: Every day | ORAL | 0 refills | Status: DC

## 2018-11-08 MED ORDER — CYCLOBENZAPRINE HCL 5 MG PO TABS: 5.0000 mg | ORAL_TABLET | Freq: Every evening | ORAL | 0 refills | Status: DC | PRN

## 2018-11-08 NOTE — ED Provider Notes (Addendum)
MRN: [id number] DOB: 09/21/1981  Subjective:   [name] is a 38 y.o. female presenting for 1 day history of sharp constant severe right wrist pain with associated swelling.  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  She is right-handed.  Has tried ibuprofen with minimal relief.  Has a history of right wrist injury.   No current facility-administered medications for this encounter.   Current Outpatient Medications:  .  ALPRAZolam (XANAX) 0.25 MG tablet, Take 0.25 mg by mouth 2 (two) times daily as needed for anxiety. , Disp: , Rfl: 2 .  doxycycline (VIBRA-TABS) 100 MG tablet, Take 1 tablet (100 mg total) by mouth 2 (two) times daily., Disp: 14 tablet, Rfl: 0 .  folic acid (FOLVITE) 1 MG tablet, Take 1 mg by mouth daily., Disp: , Rfl: 5 .  glucose blood ([employer] TEST STRIPS) test strip, 1 strip by Misc.(Non-Drug; Combo Route) route 4 times daily with meals and nightly. contour next glucose test strips, Disp: , Rfl:  .  HUMALOG 100 UNIT/ML injection, , Disp: , Rfl:  .  insulin glargine (LANTUS) 100 UNIT/ML injection, Inject 60 units [place] as directed, Disp: 10 mL, Rfl: 5 .  Insulin Human (INSULIN PUMP) SOLN, Inject into the skin continuous. *Novolog*, Disp: , Rfl:  .  levonorgestrel (MIRENA) 20 MCG/24HR IUD, 1 each by Intrauterine route once. Placed 04/2013, Disp: , Rfl:  .  methotrexate 2.5 MG tablet, Take 2.5 mg by mouth once a week. , Disp: , Rfl: 1 .  mupirocin ointment (BACTROBAN) 2 %, Apply 1 application topically daily as needed., Disp: , Rfl: 2 .  ondansetron (ZOFRAN) 4 MG tablet, 1 tab every 4-8 hours for nausea (Patient not taking: Reported on 09/08/2018), Disp: 10 tablet, Rfl: 5 .  pantoprazole (PROTONIX) 40 MG tablet, Take 1 tablet (40 mg total) by mouth daily., Disp: 90 tablet, Rfl: 4 .  Promethazine-Codeine 6.25-10 MG/5ML SOLN, Take 5 mLs by mouth every 6 (six) hours as needed., Disp: 200 mL, Rfl: 0 .  saccharomyces boulardii (FLORASTOR) 250 MG  capsule, Take 1 capsule (250 mg total) by mouth 2 (two) times daily., Disp: 30 capsule, Rfl: 0 .  spironolactone (ALDACTONE) 100 MG tablet, Take 1 tablet (100 mg total) by mouth daily., Disp: 30 tablet, Rfl: 3 .  varenicline (CHANTIX PAK) 0.5 MG X 11 & 1 MG X 42 tablet, Take one 0.5 mg tablet by mouth once daily for 3 days, then increase to one 0.5 mg tablet twice daily for 4 days, then increase to one 1 mg tablet twice daily. (Patient not taking: Reported on 09/08/2018), Disp: 53 tablet, Rfl: 0 .  VIIBRYD 40 MG TABS, TAKE 1 TABLET BY MOUTH ONCE DAILY, Disp: 30 tablet, Rfl: 6 .  Vilazodone HCl (VIIBRYD) 40 MG TABS, Take 1 tablet (40 mg total) by mouth daily. (Patient not taking: Reported on 09/08/2018), Disp: 30 tablet, Rfl: 2   Allergies  Allergen Reactions  . Latex Hives, Shortness Of Breath and Rash  . Levofloxacin Shortness Of Breath and Rash  . Moxifloxacin Shortness Of Breath and Rash  . Oxycodone-Acetaminophen Shortness Of Breath, Swelling and Rash    NORCO/VICODIN OK  . Peach [Prunus Persica] Hives and Shortness Of Breath  . Potassium-Containing Compounds Other (See Comments)    IV ROUTE - CAUSES VEINS TO COLLAPS; Reports that it is undiluted K only  . Prednisone Other (See Comments)    SEVERE ELEVATION OF BLOOD SUGAR. Able to tolerate 40 mg  .  Propoxyphene N-Acetaminophen Swelling    SWELLING OF FACE AND THROAT  . Rosiglitazone Maleate Swelling    SWELLING OF FACE AND LEGS  . Xolair [Omalizumab] Other (See Comments)    Rash and anaphylaxis  . Adhesive [Tape] Hives, Itching and Rash  . Morphine And Related Other (See Comments)    Causes hallucinations  . Prozac [Fluoxetine Hcl] Other (See Comments)    Made her very aggressive   . Chantix [Varenicline]     dreams  . Citrullus Vulgaris Nausea And Vomiting    Facial swelling  . Clindamycin/Lincomycin   . Humira [Adalimumab]     Does not remember   . Septra [Sulfamethoxazole-Trimethoprim]   . Cefaclor Rash  . Keflex  [Cephalexin] Diarrhea and Rash    REACTION: severe migraine  . Promethazine Hcl Other (See Comments)    IV ROUTE ONLY - JITTERY FEELING. Patient reports that it is mild and she has used promethazine since then  PO tablet ok  . Sulfadiazine Rash    Past Medical History:  Diagnosis Date  . Abscess of left axilla - PREVOTELLA BIVIA & Staph Coag Neg 07/12/2012   MODERATE PREVOTELLA BIVIA Note: BETA LACTAMASE NEGATIVE    . Asthma    as a child  . Cancer ([place])    skin tag rt breast  . Cellulitis 06/01/2014   RT INNER THIGH  . Depression    hx  . Diabetes ([place])   . Diabetes mellitus    IDDM, Insulin pump; followed by Dr. [name]  . GERD (gastroesophageal reflux disease)    no current meds.  . Heart murmur   . Hidradenitis 04/2012   bilat. thighs, left groin - open areas on thighs  . Hx MRSA infection   . Hydradenitis   . PCOS (polycystic ovarian syndrome)   . Pneumonia    hx  . SVT (supraventricular tachycardia) ([place]) 06/2017  . Vitamin D insufficiency      Past Surgical History:  Procedure Laterality Date  . BREAST SURGERY     right lumpectomy  . BUNIONECTOMY     left  . CARDIAC CATHETERIZATION    . CHOLECYSTECTOMY  12/02/2006   lap. chole.  Marland Kitchen DILATION AND EVACUATION  02/14/2007  . ESOPHAGOGASTRODUODENOSCOPY  11/17/2011   Procedure: ESOPHAGOGASTRODUODENOSCOPY (EGD);  Surgeon: [name], MD;  Location: [facility];  Service: Endoscopy;  Laterality: N/A;  . EYE SURGERY     exc. stye left eye  . HYDRADENITIS EXCISION  04/30/2012   Procedure: EXCISION HYDRADENITIS GROIN;  Surgeon: [name], MD;  Location: [place];  Service: General;  Laterality: Left;  wide excision hidradenitis bilateral thighs and Left groin  . HYDRADENITIS EXCISION Left 01/13/2014   Procedure: WIDE EXCISION HIDRADENITIS LEFT AXILLA;  Surgeon: [name], MD;  Location: [place];  Service: General;  Laterality: Left;  . IRRIGATION AND DEBRIDEMENT ABSCESS Right  06/02/2014   Procedure: IRRIGATION AND DEBRIDEMENT ABSCESS;  Surgeon: [name], MD;  Location: [place];  Service: General;  Laterality: Right;  . IRRIGATION AND DEBRIDEMENT ABSCESS Left 09/23/2014   Procedure: IRRIGATION AND DEBRIDEMENT ABSCESS/LEFT THIGH;  Surgeon: [name], MD;  Location: [place];  Service: General;  Laterality: Left;  . LEFT HEART CATHETERIZATION WITH CORONARY ANGIOGRAM N/A 07/14/2014   Procedure: LEFT HEART CATHETERIZATION WITH CORONARY ANGIOGRAM;  Surgeon: [name], MD;  Location: [facility] CATH LAB;  Service: Cardiovascular;  Laterality: N/A;  . TONSILLECTOMY    . WISDOM TOOTH EXTRACTION  ROS  Objective:   Vitals: BP 120/81 (BP Location: Left Arm)   Pulse (!) 103   Temp 98.1 F (36.7 C) (Temporal)   Resp 18   SpO2 99%   Physical Exam Constitutional:      General: She is not in acute distress.    Appearance: Normal appearance. She is well-developed. She is not ill-appearing.  HENT:     Head: Normocephalic and atraumatic.     Nose: Nose normal.     Mouth/Throat:     Mouth: Mucous membranes are moist.     Pharynx: Oropharynx is clear.  Eyes:     General: No scleral icterus.    Extraocular Movements: Extraocular movements intact.     Pupils: Pupils are equal, round, and reactive to light.  Cardiovascular:     Rate and Rhythm: Normal rate.  Pulmonary:     Effort: Pulmonary effort is normal.  Musculoskeletal:     Right wrist: She exhibits decreased range of motion (Flexion, extension), tenderness (Over area depicted) and swelling.       Arms:  Skin:    General: Skin is warm and dry.  Neurological:     General: No focal deficit present.     Mental Status: She is alert and oriented to person, place, and time.  Psychiatric:        Mood and Affect: Mood normal.        Behavior: Behavior normal.    Assessment and Plan :   Tendinitis of right wrist  Right wrist pain  Manage conservatively with rest, NSAID and muscle relaxant. Counseled  patient on potential for adverse effects with medications prescribed today, patient verbalized understanding. ER and return-to-clinic precautions discussed, patient verbalized understanding.    Jaynee Eagles, PA-C 11/08/18 1127    Jaynee Eagles, PA-C 11/08/18 1231

## 2018-11-08 NOTE — ED Triage Notes (Signed)
Pt sts right wrist and hand pain with swelling; pt denies injury

## 2018-11-17 DIAGNOSIS — F419 Anxiety disorder, unspecified: Secondary | ICD-10-CM | POA: Diagnosis not present

## 2018-11-17 DIAGNOSIS — F322 Major depressive disorder, single episode, severe without psychotic features: Secondary | ICD-10-CM | POA: Diagnosis not present

## 2018-11-18 ENCOUNTER — Ambulatory Visit (HOSPITAL_COMMUNITY)
Admission: RE | Admit: 2018-11-18 | Discharge: 2018-11-18 | Disposition: A | Discharge status: Home / Self Care | Payer: 59 | Attending: Psychiatry | Admitting: Psychiatry

## 2018-11-18 DIAGNOSIS — Z809 Family history of malignant neoplasm, unspecified: Secondary | ICD-10-CM | POA: Diagnosis not present

## 2018-11-18 DIAGNOSIS — F332 Major depressive disorder, recurrent severe without psychotic features: Secondary | ICD-10-CM | POA: Insufficient documentation

## 2018-11-18 DIAGNOSIS — Z9012 Acquired absence of left breast and nipple: Secondary | ICD-10-CM | POA: Diagnosis not present

## 2018-11-18 DIAGNOSIS — E119 Type 2 diabetes mellitus without complications: Secondary | ICD-10-CM | POA: Diagnosis not present

## 2018-11-18 DIAGNOSIS — Z882 Allergy status to sulfonamides status: Secondary | ICD-10-CM | POA: Insufficient documentation

## 2018-11-18 DIAGNOSIS — Z888 Allergy status to other drugs, medicaments and biological substances status: Secondary | ICD-10-CM | POA: Insufficient documentation

## 2018-11-18 DIAGNOSIS — Z883 Allergy status to other anti-infective agents status: Secondary | ICD-10-CM | POA: Diagnosis not present

## 2018-11-18 DIAGNOSIS — Z885 Allergy status to narcotic agent status: Secondary | ICD-10-CM | POA: Diagnosis not present

## 2018-11-18 DIAGNOSIS — Z85828 Personal history of other malignant neoplasm of skin: Secondary | ICD-10-CM | POA: Diagnosis not present

## 2018-11-18 DIAGNOSIS — Z9641 Presence of insulin pump (external) (internal): Secondary | ICD-10-CM | POA: Diagnosis not present

## 2018-11-18 DIAGNOSIS — Z881 Allergy status to other antibiotic agents status: Secondary | ICD-10-CM | POA: Diagnosis not present

## 2018-11-18 DIAGNOSIS — Z91040 Latex allergy status: Secondary | ICD-10-CM | POA: Diagnosis not present

## 2018-11-18 DIAGNOSIS — Z91018 Allergy to other foods: Secondary | ICD-10-CM | POA: Insufficient documentation

## 2018-11-18 DIAGNOSIS — Z886 Allergy status to analgesic agent status: Secondary | ICD-10-CM | POA: Insufficient documentation

## 2018-11-18 NOTE — H&P (Signed)
[place] Screening Exam  [name] is an 38 y.o. female patient presents to [facility] as walk in with complaints of feeling overwhelmed at work; causing worsening depression and anxiety.  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  Patient denies suicidal/self-harm/homicidal ideation, psychosis, and paranoia.    Total Time spent with patient: 30 minutes  Psychiatric Specialty Exam: Physical Exam  Constitutional: She is oriented to person, place, and time. She appears well-developed and well-nourished.  Neck: Neck supple.  Respiratory: Effort normal.  Musculoskeletal: Normal range of motion.  Neurological: She is alert and oriented to person, place, and time.  Skin: Skin is warm and dry.  Psychiatric: Her speech is normal and behavior is normal. Judgment normal. Her mood appears anxious. Thought content is not paranoid and not delusional. Cognition and memory are normal. She exhibits a depressed mood. She expresses no homicidal and no suicidal ideation.    Review of Systems  Psychiatric/Behavioral: Positive for depression. Hallucinations: Denies. Substance abuse: Denies. Suicidal ideas: Denies. The patient is nervous/anxious.   All other systems reviewed and are negative.   Blood pressure (!) 136/95, pulse (!) 109, temperature 97.9 F (36.6 C), resp. rate 16, SpO2 100 %.There is no height or weight on file to calculate BMI.  General Appearance: Casual  Eye Contact:  Good  Speech:  Clear and Coherent and Normal Rate  Volume:  Normal  Mood:  Anxious and Depressed  Affect:  Appropriate and Congruent  Thought Process:  Coherent and Goal Directed  Orientation:  Full (Time, Place, and Person)  Thought Content:  WDL and Logical  Suicidal Thoughts:  No  Homicidal Thoughts:  No  Memory:  Immediate;   Good Recent;   Good Remote;   Good  Judgement:  Intact  Insight:  Present  Psychomotor Activity:  Normal   Concentration: Concentration: Good and Attention Span: Good  Recall:  Good  Fund of Knowledge:Good  Language: Good  Akathisia:  No  Handed:  Right  AIMS (if indicated):     Assets:  Communication Skills Desire for Improvement Financial Resources/Insurance Housing Physical Health Social Support Transportation  Sleep:       Musculoskeletal: Strength & Muscle Tone: within normal limits Gait & Station: normal Patient leans: N/A  Blood pressure (!) 136/95, pulse (!) 109, temperature 97.9 F (36.6 C), resp. rate 16, SpO2 100 %.  Recommendations:  Outpatient psychiatric services.  Appoint me set for [facility] outpatient services for 11/19/18 at 10:00 AM  Based on my evaluation the patient does not appear to have an emergency medical condition.   Disposition: No evidence of imminent risk to self or others at present.   Patient does not meet criteria for psychiatric inpatient admission. Supportive therapy provided about ongoing stressors. Refer to IOP. Discussed crisis plan, support from social network, calling 911, coming to the Emergency Department, and calling Suicide Hotline.  Shuvon Rankin, NP 11/18/2018, 12:39 PM
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
## 2018-11-19 ENCOUNTER — Ambulatory Visit (HOSPITAL_COMMUNITY): Payer: 59

## 2018-11-22 ENCOUNTER — Other Ambulatory Visit (HOSPITAL_COMMUNITY): Payer: 59 | Attending: Psychiatry | Admitting: Licensed Clinical Social Worker

## 2018-11-22 DIAGNOSIS — F332 Major depressive disorder, recurrent severe without psychotic features: Secondary | ICD-10-CM | POA: Insufficient documentation

## 2018-11-22 DIAGNOSIS — Z791 Long term (current) use of non-steroidal anti-inflammatories (NSAID): Secondary | ICD-10-CM | POA: Diagnosis not present

## 2018-11-22 DIAGNOSIS — F4310 Post-traumatic stress disorder, unspecified: Secondary | ICD-10-CM | POA: Insufficient documentation

## 2018-11-22 DIAGNOSIS — R4589 Other symptoms and signs involving emotional state: Secondary | ICD-10-CM | POA: Insufficient documentation

## 2018-11-22 DIAGNOSIS — K219 Gastro-esophageal reflux disease without esophagitis: Secondary | ICD-10-CM | POA: Diagnosis not present

## 2018-11-22 DIAGNOSIS — Z794 Long term (current) use of insulin: Secondary | ICD-10-CM | POA: Insufficient documentation

## 2018-11-22 DIAGNOSIS — Z7901 Long term (current) use of anticoagulants: Secondary | ICD-10-CM | POA: Diagnosis not present

## 2018-11-22 DIAGNOSIS — Z793 Long term (current) use of hormonal contraceptives: Secondary | ICD-10-CM | POA: Diagnosis not present

## 2018-11-22 DIAGNOSIS — J45909 Unspecified asthma, uncomplicated: Secondary | ICD-10-CM | POA: Insufficient documentation

## 2018-11-22 DIAGNOSIS — Z79899 Other long term (current) drug therapy: Secondary | ICD-10-CM | POA: Insufficient documentation

## 2018-11-22 DIAGNOSIS — Z8614 Personal history of Methicillin resistant Staphylococcus aureus infection: Secondary | ICD-10-CM | POA: Insufficient documentation

## 2018-11-22 DIAGNOSIS — R45851 Suicidal ideations: Secondary | ICD-10-CM | POA: Diagnosis not present

## 2018-11-22 DIAGNOSIS — Z818 Family history of other mental and behavioral disorders: Secondary | ICD-10-CM | POA: Insufficient documentation

## 2018-11-22 DIAGNOSIS — F411 Generalized anxiety disorder: Secondary | ICD-10-CM | POA: Diagnosis not present

## 2018-11-22 DIAGNOSIS — R011 Cardiac murmur, unspecified: Secondary | ICD-10-CM | POA: Insufficient documentation

## 2018-11-22 DIAGNOSIS — E119 Type 2 diabetes mellitus without complications: Secondary | ICD-10-CM | POA: Diagnosis not present

## 2018-11-22 DIAGNOSIS — Z87891 Personal history of nicotine dependence: Secondary | ICD-10-CM | POA: Insufficient documentation
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
## 2018-11-24 ENCOUNTER — Other Ambulatory Visit (HOSPITAL_COMMUNITY): Payer: 59 | Admitting: Licensed Clinical Social Worker

## 2018-11-24 ENCOUNTER — Encounter (HOSPITAL_COMMUNITY): Payer: Self-pay | Admitting: Family

## 2018-11-24 VITALS — BP 116/74 | HR 112 | Ht 64.5 in | Wt 221.0 lb

## 2018-11-24 DIAGNOSIS — F332 Major depressive disorder, recurrent severe without psychotic features: Secondary | ICD-10-CM | POA: Diagnosis not present

## 2018-11-24 DIAGNOSIS — R45851 Suicidal ideations: Secondary | ICD-10-CM | POA: Diagnosis not present

## 2018-11-24 DIAGNOSIS — R4589 Other symptoms and signs involving emotional state: Secondary | ICD-10-CM | POA: Diagnosis not present

## 2018-11-24 DIAGNOSIS — E119 Type 2 diabetes mellitus without complications: Secondary | ICD-10-CM | POA: Diagnosis not present

## 2018-11-24 DIAGNOSIS — K219 Gastro-esophageal reflux disease without esophagitis: Secondary | ICD-10-CM | POA: Diagnosis not present

## 2018-11-24 DIAGNOSIS — J45909 Unspecified asthma, uncomplicated: Secondary | ICD-10-CM | POA: Diagnosis not present

## 2018-11-24 DIAGNOSIS — F4310 Post-traumatic stress disorder, unspecified: Secondary | ICD-10-CM | POA: Diagnosis not present

## 2018-11-24 DIAGNOSIS — F411 Generalized anxiety disorder: Secondary | ICD-10-CM | POA: Diagnosis not present

## 2018-11-24 DIAGNOSIS — R011 Cardiac murmur, unspecified: Secondary | ICD-10-CM | POA: Diagnosis not present

## 2018-11-24 MED ORDER — HYDROXYZINE PAMOATE 25 MG PO CAPS: 25.0000 mg | ORAL_CAPSULE | Freq: Three times a day (TID) | ORAL | 0 refills | Status: DC | PRN

## 2018-11-24 MED ORDER — TRAZODONE HCL 50 MG PO TABS: 50.0000 mg | ORAL_TABLET | Freq: Every day | ORAL | 0 refills | Status: DC
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
## 2018-11-25 ENCOUNTER — Encounter (HOSPITAL_COMMUNITY): Payer: Self-pay | Admitting: Occupational Therapy

## 2018-11-25 ENCOUNTER — Other Ambulatory Visit (HOSPITAL_COMMUNITY): Payer: 59 | Admitting: Occupational Therapy

## 2018-11-25 ENCOUNTER — Other Ambulatory Visit: Payer: Self-pay

## 2018-11-25 ENCOUNTER — Other Ambulatory Visit (HOSPITAL_COMMUNITY): Payer: 59 | Admitting: Licensed Clinical Social Worker

## 2018-11-25 DIAGNOSIS — F332 Major depressive disorder, recurrent severe without psychotic features: Secondary | ICD-10-CM

## 2018-11-25 DIAGNOSIS — R4589 Other symptoms and signs involving emotional state: Secondary | ICD-10-CM | POA: Diagnosis not present

## 2018-11-25 DIAGNOSIS — J45909 Unspecified asthma, uncomplicated: Secondary | ICD-10-CM | POA: Diagnosis not present

## 2018-11-25 DIAGNOSIS — E119 Type 2 diabetes mellitus without complications: Secondary | ICD-10-CM | POA: Diagnosis not present

## 2018-11-25 DIAGNOSIS — F411 Generalized anxiety disorder: Secondary | ICD-10-CM | POA: Diagnosis not present

## 2018-11-25 DIAGNOSIS — R011 Cardiac murmur, unspecified: Secondary | ICD-10-CM | POA: Diagnosis not present

## 2018-11-25 DIAGNOSIS — R45851 Suicidal ideations: Secondary | ICD-10-CM | POA: Diagnosis not present

## 2018-11-25 DIAGNOSIS — F070 Personality change due to known physiological condition: Secondary | ICD-10-CM

## 2018-11-25 DIAGNOSIS — F4310 Post-traumatic stress disorder, unspecified: Secondary | ICD-10-CM | POA: Diagnosis not present

## 2018-11-25 DIAGNOSIS — K219 Gastro-esophageal reflux disease without esophagitis: Secondary | ICD-10-CM | POA: Diagnosis not present

## 2018-11-25 NOTE — Therapy (Signed)
[place], [place], [address] Phone: [phone]   Fax:  [phone]  Occupational Therapy Evaluation  Patient Details  Name: [name] MRN: [id number] Date of Birth: 09-16-1981 Referring Provider (OT): [name], NP   Encounter Date: 11/25/2018  OT End of Session - 11/25/18 1428    Visit Number  1    Number of Visits  16    Date for OT Re-Evaluation  12/23/18    Authorization Type  [place] [employer]    OT Start Time  1100    OT Stop Time  1230    OT Time Calculation (min)  90 min    Activity Tolerance  Patient tolerated treatment well    Behavior During Therapy  [facility] for tasks assessed/performed       Past Medical History:  Diagnosis Date  . Abscess of left axilla - PREVOTELLA BIVIA & Staph Coag Neg 07/12/2012   MODERATE PREVOTELLA BIVIA Note: BETA LACTAMASE NEGATIVE    . Asthma    as a child  . Cancer ([place])    skin tag rt breast  . Cellulitis 06/01/2014   RT INNER THIGH  . Depression    hx  . Diabetes ([place])   . Diabetes mellitus    IDDM, Insulin pump; followed by Dr. [name]  . GERD (gastroesophageal reflux disease)    no current meds.  . Heart murmur   . Hidradenitis 04/2012   bilat. thighs, left groin - open areas on thighs  . Hx MRSA infection   . Hydradenitis   . PCOS (polycystic ovarian syndrome)   . Pneumonia    hx  . SVT (supraventricular tachycardia) ([place]) 06/2017  . Vitamin D insufficiency     Past Surgical History:  Procedure Laterality Date  . BREAST SURGERY     right lumpectomy  . BUNIONECTOMY     left  . CARDIAC CATHETERIZATION    . CHOLECYSTECTOMY  12/02/2006   lap. chole.  Marland Kitchen DILATION AND EVACUATION  02/14/2007  . ESOPHAGOGASTRODUODENOSCOPY  11/17/2011   Procedure: ESOPHAGOGASTRODUODENOSCOPY (EGD);  Surgeon: [name], MD;  Location: [facility];  Service: Endoscopy;  Laterality: N/A;  . EYE SURGERY     exc. stye left eye  .  HYDRADENITIS EXCISION  04/30/2012   Procedure: EXCISION HYDRADENITIS GROIN;  Surgeon: [name], MD;  Location: [place];  Service: General;  Laterality: Left;  wide excision hidradenitis bilateral thighs and Left groin  . HYDRADENITIS EXCISION Left 01/13/2014   Procedure: WIDE EXCISION HIDRADENITIS LEFT AXILLA;  Surgeon: [name], MD;  Location: [place];  Service: General;  Laterality: Left;  . IRRIGATION AND DEBRIDEMENT ABSCESS Right 06/02/2014   Procedure: IRRIGATION AND DEBRIDEMENT ABSCESS;  Surgeon: [name], MD;  Location: [place];  Service: General;  Laterality: Right;  . IRRIGATION AND DEBRIDEMENT ABSCESS Left 09/23/2014   Procedure: IRRIGATION AND DEBRIDEMENT ABSCESS/LEFT THIGH;  Surgeon: [name], MD;  Location: [place];  Service: General;  Laterality: Left;  . LEFT HEART CATHETERIZATION WITH CORONARY ANGIOGRAM N/A 07/14/2014   Procedure: LEFT HEART CATHETERIZATION WITH CORONARY ANGIOGRAM;  Surgeon: [name], MD;  Location: [facility] CATH LAB;  Service: Cardiovascular;  Laterality: N/A;  . TONSILLECTOMY    . WISDOM TOOTH EXTRACTION      There were no vitals filed for this visit.  Subjective Assessment - 11/25/18 1419    Currently in Pain?  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   No    Is the patient reluctant to leave their home  because of a fear of falling?   No        OT assessment: OCAIRS  Diagnosis: MDD severe without psychotic features, GAD  Past medical history/referral information: Pt presents to PHP After an increase in symptoms affecting daily functioning.  Living situation: Pt lives with fiance and 13 year old daughter  ADLs/IADLs: Pt report a decreased engagement in this area. She states she is still showering, but it is a difficult time to put clothes on. She also shares that her appetite is decreased to one meal per day. 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 but is stressed with the work environment  Leisure: Pt currently is not engaging  Social support: Very isolated  Struggles: sleep, social, goals   [place] Summary of Client Scores:  FACILITATES PARTICIPATION IN OCCUPATION  ALLOWS PARTICIPATION IN OCCUPATION INHIBITS PARTICIPATION IN OCCUPATION RESTRICTS [place]               X  Increased role strain  HABITS               X  Increased difficulty maintaining her and daughter's routine  PERSONAL CAUSATION               X  Unable to state areas of pride  VALUES               X  Vague  INTERESTS                X None currently  SKILLS               X  Decreased attention  SHORT TERM GOALS                X None currently  LONG TERM GOALS                X   INTERPETATION OF PAST EXPERIENCES               X  Vague differentiation  PHYSICAL ENVIRONMENT               X  Decreased engagement  SOCIAL ENVIRONMENT               X  Isolating, states "I don't care"  READINESS FOR CHANGE               X       Need for Occupational Therapy:  4  Shows positive occupational participation, no need for OT.   3 Need for minimal intervention/consultative participation     X 2 Need for OT intervention indicated to  restore/improve participation   1 Need for extensive OT intervention indicated to improve participation.  Referral for follow up services also recommended.    Assessment:  Patient demonstrates behavior that inhibits participation in occupation.  Patient will benefit from occupational therapy intervention in order to improve time management, financial management, stress management, job readiness skills, social skills, and health management skills in preparation to return to full time community living and to be a productive community member.    Plan:  Patient will participate in skilled occupational therapy sessions individually or in a group setting to improve coping skills, psychosocial skills, and emotional skills required to return to prior level of function.  Treatment will be 4 times per week for 4 weeks.      OT TREATMENT  S: "I know relaxation strategies are helpful"   O: Pt educated on sleep hygiene as it pertains to daily life/routines this date. Education given on appropriate sleep routines, sleep disorders, detriments of too much/too little sleep with encouraged feedback of personal Experiences. Sleep hygiene handout given for pt to choose new areas for implementation in BADL routine. Further education given on relaxation techniques to implement before bed. Pt asked to identify one STG in relation to sleep hygiene to create better daily sleep habits.  ?  A: Pt presents to group with flat/depressed affect, engaged and participatory throughout session. Pt in understanding of sleep hygiene education given. She shares that she would like to increase her usage of relaxation strategies, and create a comfortable environment.  ?  P: Pt provided with skills to increase sleep hygiene habits into daily routine. OT will continue to follow up with communication skills for successful implementation into daily life.             OT Education - 11/25/18 1427    Education Details  education  given on sleep hygiene    Person(s) Educated  Patient    Methods  Explanation;Handout    Comprehension  Verbalized understanding       OT Short Term Goals - 11/25/18 1429      OT SHORT TERM GOAL #1   Title  Pt will be educated on strategies to improve psychosocial skills needed to participate fully in all daily, work, and leisure activities    Time  4    Period  Weeks    Status  New    Target Date  12/23/18      OT SHORT TERM GOAL #2   Title  Pt will apply psychosocial skills and coping mechanisms to daily activities in order to function independently and reintegrate into community    Time  4    Period  Weeks    Status  New    Target Date  12/23/18      OT SHORT TERM GOAL #3   Title  Pt will recall and apply 1-3 sleep hygiene strategies to improve BADL function    Time  4    Period  Weeks    Status  New    Target Date  12/23/18      OT SHORT TERM GOAL #4   Title  Pt will choose and/or engage in 1-3 socially engaging leisure activities to improve social participation upon reintegrating into community    Time  4    Period  Weeks  Status  New    Target Date  12/23/18      OT SHORT TERM GOAL #5   Title  Pt will engage in goal setting to improve functional BADL/IADL routine upon reintegrating into community    Time  4    Period  Weeks    Status  New    Target Date  12/23/18               Plan - 11/25/18 1428    OT Occupational Profile and History  Problem Focused Assessment - Including review of records relating to presenting problem    Occupational performance deficits (Please refer to evaluation for details):  ADL's;IADL's;Rest and Sleep;Work;Leisure;Social Participation    Body Structure / Function / Physical Skills  ADL    Cognitive Skills  Energy/Drive    Psychosocial Skills  Coping Strategies;Interpersonal Interaction;Routines and Behaviors;Habits    Clinical Decision Making  Several treatment options, min-mod task modification necessary     Comorbidities Affecting Occupational Performance:  None    Modification or Assistance to Complete Evaluation   No modification of tasks or assist necessary to complete eval    OT Frequency  4x / week    OT Duration  4 weeks    OT Treatment/Interventions  Psychosocial skills training;Coping strategies training;Self-care/ADL training;Other (comment)   community reintegration   Consulted and Agree with Plan of Care  Patient       Patient will benefit from skilled therapeutic intervention in order to improve the following deficits and impairments:  Body Structure / Function / Physical Skills, Cognitive Skills, Psychosocial Skills  Visit Diagnosis: Organic personality disorder  Difficulty coping    Problem List Patient Active Problem List   Diagnosis Date Noted  . Enteritis 12/24/2017  . Intractable nausea and vomiting 12/24/2017  . Epigastric pain   . Other chest pain 09/28/2017  . TIA (transient ischemic attack) 09/28/2017  . Paroxysmal SVT (supraventricular tachycardia) ([place]) 06/24/2017  . Dizziness 06/24/2017  . Weakness 06/24/2017  . Hyperglycemia 03/17/2016  . Abscess 03/17/2016  . Chest discomfort 07/10/2014  . Cellulitis 06/01/2014  . Cellulitis of right thigh 06/01/2014  . Postop check 01/19/2014  . Smoking addiction 08/29/2013  . DKA, type 1 ([place]) 05/09/2013  . Hidradenitis suppurativa 04/21/2012  . Esophageal reflux 11/17/2011  . Seasonal and perennial allergic rhinitis 05/20/2010  . BRONCHITIS, ACUTE 11/29/2007  . HYPONATREMIA 11/14/2007  . ANXIETY STATE, UNSPECIFIED 10/24/2007  . Sinus tachycardia 07/07/2007  . Diabetes mellitus type I ([place]) 04/08/2007  . Smoker 04/08/2007  . Depression 04/08/2007  . 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 MIGRAINES, HX OF 04/08/2007   Zenovia Jarred, MSOT, OTR/L Behavioral Health OT/ Acute Relief OT PHP Office: [phone]  Zenovia Jarred 11/25/2018, 2:34 PM  [facility] PARTIAL HOSPITALIZATION  PROGRAM [place], [place], [address] Phone: [phone]   Fax:  [phone]  Name: [name] MRN: [id number] Date of Birth: 02-Jan-1981

## 2018-11-26 ENCOUNTER — Other Ambulatory Visit (HOSPITAL_COMMUNITY): Payer: 59 | Admitting: Licensed Clinical Social Worker

## 2018-11-26 ENCOUNTER — Encounter (HOSPITAL_COMMUNITY): Payer: Self-pay | Admitting: Occupational Therapy

## 2018-11-26 ENCOUNTER — Other Ambulatory Visit (HOSPITAL_COMMUNITY): Payer: 59 | Admitting: Occupational Therapy

## 2018-11-26 DIAGNOSIS — R4589 Other symptoms and signs involving emotional state: Secondary | ICD-10-CM | POA: Diagnosis not present

## 2018-11-26 DIAGNOSIS — R45851 Suicidal ideations: Secondary | ICD-10-CM | POA: Diagnosis not present

## 2018-11-26 DIAGNOSIS — K219 Gastro-esophageal reflux disease without esophagitis: Secondary | ICD-10-CM | POA: Diagnosis not present

## 2018-11-26 DIAGNOSIS — F332 Major depressive disorder, recurrent severe without psychotic features: Secondary | ICD-10-CM

## 2018-11-26 DIAGNOSIS — J45909 Unspecified asthma, uncomplicated: Secondary | ICD-10-CM | POA: Diagnosis not present

## 2018-11-26 DIAGNOSIS — F4310 Post-traumatic stress disorder, unspecified: Secondary | ICD-10-CM | POA: Diagnosis not present

## 2018-11-26 DIAGNOSIS — E119 Type 2 diabetes mellitus without complications: Secondary | ICD-10-CM | POA: Diagnosis not present

## 2018-11-26 DIAGNOSIS — F411 Generalized anxiety disorder: Secondary | ICD-10-CM | POA: Diagnosis not present

## 2018-11-26 DIAGNOSIS — F070 Personality change due to known physiological condition: Secondary | ICD-10-CM

## 2018-11-26 DIAGNOSIS — R011 Cardiac murmur, unspecified: Secondary | ICD-10-CM | POA: Diagnosis not present

## 2018-11-26 NOTE — Therapy (Signed)
[place], [place], [address] Phone: [phone]   Fax:  [phone]  Occupational Therapy Treatment  Patient Details  Name: [name] MRN: [id number] Date of Birth: 06/18/1981 Referring Provider (OT): [name], NP   Encounter Date: 11/26/2018  OT End of Session - 11/26/18 1305    Visit Number  2    Number of Visits  16    Date for OT Re-Evaluation  12/23/18    Authorization Type  [place] [employer]    OT Start Time  1100    OT Stop Time  1200    OT Time Calculation (min)  60 min    Activity Tolerance  Patient tolerated treatment well    Behavior During Therapy  [facility] for tasks assessed/performed       Past Medical History:  Diagnosis Date  . Abscess of left axilla - PREVOTELLA BIVIA & Staph Coag Neg 07/12/2012   MODERATE PREVOTELLA BIVIA Note: BETA LACTAMASE NEGATIVE    . Asthma    as a child  . Cancer ([place])    skin tag rt breast  . Cellulitis 06/01/2014   RT INNER THIGH  . Depression    hx  . Diabetes ([place])   . Diabetes mellitus    IDDM, Insulin pump; followed by Dr. [name]  . GERD (gastroesophageal reflux disease)    no current meds.  . Heart murmur   . Hidradenitis 04/2012   bilat. thighs, left groin - open areas on thighs  . Hx MRSA infection   . Hydradenitis   . PCOS (polycystic ovarian syndrome)   . Pneumonia    hx  . SVT (supraventricular tachycardia) ([place]) 06/2017  . Vitamin D insufficiency     Past Surgical History:  Procedure Laterality Date  . BREAST SURGERY     right lumpectomy  . BUNIONECTOMY     left  . CARDIAC CATHETERIZATION    . CHOLECYSTECTOMY  12/02/2006   lap. chole.  Marland Kitchen DILATION AND EVACUATION  02/14/2007  . ESOPHAGOGASTRODUODENOSCOPY  11/17/2011   Procedure: ESOPHAGOGASTRODUODENOSCOPY (EGD);  Surgeon: [name], MD;  [facility];  Service: Endoscopy;  Laterality: N/A;  . EYE SURGERY     exc. stye left eye  .  HYDRADENITIS EXCISION  04/30/2012   Procedure: EXCISION HYDRADENITIS GROIN;  Surgeon: [name], MD;  Location: [place];  Service: General;  Laterality: Left;  wide excision hidradenitis bilateral thighs and Left groin  . HYDRADENITIS EXCISION Left 01/13/2014   Procedure: WIDE EXCISION HIDRADENITIS LEFT AXILLA;  Surgeon: [name], MD;  Location: [place];  Service: General;  Laterality: Left;  . IRRIGATION AND DEBRIDEMENT ABSCESS Right 06/02/2014   Procedure: IRRIGATION AND DEBRIDEMENT ABSCESS;  Surgeon: [name], MD;  Location: [place];  Service: General;  Laterality: Right;  . IRRIGATION AND DEBRIDEMENT ABSCESS Left 09/23/2014   Procedure: IRRIGATION AND DEBRIDEMENT ABSCESS/LEFT THIGH;  Surgeon: [name], MD;  Location: [place];  Service: General;  Laterality: Left;  . LEFT HEART CATHETERIZATION WITH CORONARY ANGIOGRAM N/A 07/14/2014   Procedure: LEFT HEART CATHETERIZATION WITH CORONARY ANGIOGRAM;  Surgeon: [name], MD;  Location: [facility] CATH LAB;  Service: Cardiovascular;  Laterality: N/A;  . TONSILLECTOMY    . WISDOM TOOTH EXTRACTION      There were no vitals filed for this visit.  Subjective Assessment - 11/26/18 1305    Currently in Pain?  Other (Comment)   chronic pain  S: "I like lavender and have used a diffuser before"  O: Pt presented with education and exploration of essential oils this date for an alternative approach to managing current symptoms. Pt encouraged to explore and share with other group members their experiences and preferences with essential oils. Pt given the opportunity to place preferred scents on cotton balls and take home. Pt given education on safety of oils. Additionally pt given tips to apply essential oils to relaxation techniques such as a hot bath, meditation, massage, etc. Deep breathing exercises given and discussed. Pt to identify one goal to implement essential oils in BADL/IADL routine as additional coping  mechanism.   A: Pt presents to group with flat affect, engaged and participatory throughout entirery of session. Pt mentions some experience with essential oils this date. Pt engaged with oils, but not taking any for future use. One goal pt stated is to continue using lavender or to put it in her candle warmer for increased production of scent. Pt in understanding of breathing techniques and wishes to keep implementing these strategies.  P: OT will continue follow up with essential oils education to ensure appropriate education whe applying to daily BADL routine.                      OT Education - 11/26/18 1305    Education Details  education given on essential oils    Person(s) Educated  Patient    Methods  Explanation;Handout    Comprehension  Verbalized understanding       OT Short Term Goals - 11/26/18 1306      OT SHORT TERM GOAL #1   Title  Pt will be educated on strategies to improve psychosocial skills needed to participate fully in all daily, work, and leisure activities    Time  4    Period  Weeks    Status  On-going    Target Date  12/23/18      OT SHORT TERM GOAL #2   Title  Pt will apply psychosocial skills and coping mechanisms to daily activities in order to function independently and reintegrate into community    Time  4    Period  Weeks    Status  On-going    Target Date  12/23/18      OT SHORT TERM GOAL #3   Title  Pt will recall and apply 1-3 sleep hygiene strategies to improve BADL function    Time  4    Period  Weeks    Status  On-going    Target Date  12/23/18      OT SHORT TERM GOAL #4   Title  Pt will choose and/or engage in 1-3 socially engaging leisure activities to improve social participation upon reintegrating into community    Time  4    Period  Weeks    Target Date  12/23/18      OT SHORT TERM GOAL #5   Title  Pt will engage in goal setting to improve functional BADL/IADL routine upon reintegrating into community    Time   4    Period  Weeks    Status  On-going    Target Date  12/23/18               Plan - 11/26/18 1305    Occupational performance deficits (Please refer to evaluation for details):  ADL's;IADL's;Rest and Sleep;Work;Leisure;Social Participation    Body Structure / Function / Physical Skills  ADL  Cognitive Skills  Energy/Drive    Psychosocial Skills  Coping Strategies;Interpersonal Interaction;Routines and Behaviors;Habits       Patient will benefit from skilled therapeutic intervention in order to improve the following deficits and impairments:  Body Structure / Function / Physical Skills, Cognitive Skills, Psychosocial Skills  Visit Diagnosis: Organic personality disorder  Difficulty coping    Problem List Patient Active Problem List   Diagnosis Date Noted  . Enteritis 12/24/2017  . Intractable nausea and vomiting 12/24/2017  . Epigastric pain   . Other chest pain 09/28/2017  . TIA (transient ischemic attack) 09/28/2017  . Paroxysmal SVT (supraventricular tachycardia) ([place]) 06/24/2017  . Dizziness 06/24/2017  . Weakness 06/24/2017  . Hyperglycemia 03/17/2016  . Abscess 03/17/2016  . Chest discomfort 07/10/2014  . Cellulitis 06/01/2014  . Cellulitis of right thigh 06/01/2014  . Postop check 01/19/2014  . Smoking addiction 08/29/2013  . DKA, type 1 ([place]) 05/09/2013  . Hidradenitis suppurativa 04/21/2012  . Esophageal reflux 11/17/2011  . Seasonal and perennial allergic rhinitis 05/20/2010  . BRONCHITIS, ACUTE 11/29/2007  . HYPONATREMIA 11/14/2007  . ANXIETY STATE, UNSPECIFIED 10/24/2007  . Sinus tachycardia 07/07/2007  . Diabetes mellitus type I ([place]) 04/08/2007  . Smoker 04/08/2007  . Depression 04/08/2007  . 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 MIGRAINES, HX OF 04/08/2007   Zenovia Jarred, MSOT, OTR/L Behavioral Health OT/ Acute Relief OT PHP Office: [phone]  Zenovia Jarred 11/26/2018, 1:07 PM  [facility]  HOSPITALIZATION PROGRAM [place], [place], [address] Phone: [phone]   Fax:  [phone]  Name: [name] MRN: [id number] Date of Birth: 1981/06/23

## 2018-11-28 ENCOUNTER — Encounter (HOSPITAL_COMMUNITY): Payer: Self-pay

## 2018-11-29 ENCOUNTER — Encounter (HOSPITAL_COMMUNITY): Payer: Self-pay | Admitting: Occupational Therapy

## 2018-11-29 ENCOUNTER — Other Ambulatory Visit (HOSPITAL_COMMUNITY): Payer: 59 | Admitting: Licensed Clinical Social Worker

## 2018-11-29 ENCOUNTER — Other Ambulatory Visit (HOSPITAL_COMMUNITY): Payer: 59 | Admitting: Occupational Therapy

## 2018-11-29 DIAGNOSIS — R4589 Other symptoms and signs involving emotional state: Secondary | ICD-10-CM | POA: Diagnosis not present

## 2018-11-29 DIAGNOSIS — K219 Gastro-esophageal reflux disease without esophagitis: Secondary | ICD-10-CM | POA: Diagnosis not present

## 2018-11-29 DIAGNOSIS — R011 Cardiac murmur, unspecified: Secondary | ICD-10-CM | POA: Diagnosis not present

## 2018-11-29 DIAGNOSIS — F411 Generalized anxiety disorder: Secondary | ICD-10-CM | POA: Diagnosis not present

## 2018-11-29 DIAGNOSIS — F332 Major depressive disorder, recurrent severe without psychotic features: Secondary | ICD-10-CM

## 2018-11-29 DIAGNOSIS — R45851 Suicidal ideations: Secondary | ICD-10-CM | POA: Diagnosis not present

## 2018-11-29 DIAGNOSIS — F070 Personality change due to known physiological condition: Secondary | ICD-10-CM

## 2018-11-29 DIAGNOSIS — F4310 Post-traumatic stress disorder, unspecified: Secondary | ICD-10-CM | POA: Diagnosis not present

## 2018-11-29 DIAGNOSIS — J45909 Unspecified asthma, uncomplicated: Secondary | ICD-10-CM | POA: Diagnosis not present

## 2018-11-29 DIAGNOSIS — E119 Type 2 diabetes mellitus without complications: Secondary | ICD-10-CM | POA: Diagnosis not present

## 2018-11-29 NOTE — Therapy (Signed)
[place], [place], [address] Phone: [phone]   Fax:  [phone]  Occupational Therapy Treatment  Patient Details  Name: [name] MRN: [id number] Date of Birth: [birth year] Referring Provider (OT): [name], NP   Encounter Date: 11/29/2018  OT End of Session - 11/29/18 1251    Visit Number  3    Number of Visits  16    Date for OT Re-Evaluation  12/23/18    Authorization Type  [place] [employer]    OT Start Time  1100    OT Stop Time  1200    OT Time Calculation (min)  60 min    Activity Tolerance  Patient tolerated treatment well    Behavior During Therapy  [facility] for tasks assessed/performed       Past Medical History:  Diagnosis Date  . Abscess of left axilla - PREVOTELLA BIVIA & Staph Coag Neg 07/12/2012   MODERATE PREVOTELLA BIVIA Note: BETA LACTAMASE NEGATIVE    . Asthma    as a child  . Cancer ([place])    skin tag rt breast  . Cellulitis 06/01/2014   RT INNER THIGH  . Depression    hx  . Diabetes ([place])   . Diabetes mellitus    IDDM, Insulin pump; followed by Dr. [name]  . GERD (gastroesophageal reflux disease)    no current meds.  . Heart murmur   . Hidradenitis 04/2012   bilat. thighs, left groin - open areas on thighs  . Hx MRSA infection   . Hydradenitis   . PCOS (polycystic ovarian syndrome)   . Pneumonia    hx  . SVT (supraventricular tachycardia) ([place]) 06/2017  . Vitamin D insufficiency     Past Surgical History:  Procedure Laterality Date  . BREAST SURGERY     right lumpectomy  . BUNIONECTOMY     left  . CARDIAC CATHETERIZATION    . CHOLECYSTECTOMY  12/02/2006   lap. chole.  Marland Kitchen DILATION AND EVACUATION  02/14/2007  . ESOPHAGOGASTRODUODENOSCOPY  11/17/2011   Procedure: ESOPHAGOGASTRODUODENOSCOPY (EGD);  Surgeon: [name], MD;  [facility];  Service: Endoscopy;  Laterality: N/A;  . EYE SURGERY     exc. stye left eye  .  HYDRADENITIS EXCISION  04/30/2012   Procedure: EXCISION HYDRADENITIS GROIN;  Surgeon: [name], MD;  Location: [place];  Service: General;  Laterality: Left;  wide excision hidradenitis bilateral thighs and Left groin  . HYDRADENITIS EXCISION Left 01/13/2014   Procedure: WIDE EXCISION HIDRADENITIS LEFT AXILLA;  Surgeon: [name], MD;  Location: [place];  Service: General;  Laterality: Left;  . IRRIGATION AND DEBRIDEMENT ABSCESS Right 06/02/2014   Procedure: IRRIGATION AND DEBRIDEMENT ABSCESS;  Surgeon: [name], MD;  Location: [place];  Service: General;  Laterality: Right;  . IRRIGATION AND DEBRIDEMENT ABSCESS Left 09/23/2014   Procedure: IRRIGATION AND DEBRIDEMENT ABSCESS/LEFT THIGH;  Surgeon: [name], MD;  Location: [place];  Service: General;  Laterality: Left;  . LEFT HEART CATHETERIZATION WITH CORONARY ANGIOGRAM N/A 07/14/2014   Procedure: LEFT HEART CATHETERIZATION WITH CORONARY ANGIOGRAM;  Surgeon: [name], MD;  Location: [facility] CATH LAB;  Service: Cardiovascular;  Laterality: N/A;  . TONSILLECTOMY    . WISDOM TOOTH EXTRACTION      There were no vitals filed for this visit.  Subjective Assessment - 11/29/18 1251    Currently in Pain?  Other (Comment)   chronic pain  S: "I suppress my stress"   O: Stress management group completed to use as productive coping strategy, to help mitigate maladaptive coping to integrate in functional BADL/IADL. Education given on the definition of stress and its cognitive, behavioral, emotional, and physical effects on the body. Pt to work with partner and describe common negative coping skills (suppression, passivity, drug/alcohol use, blaming/complaining, and acting out). Pt then to share alternative healthy coping strategies. Discussion encouraged between participants to help increase insight and understanding in reference to daily life. Stress management strategies to continue next date.  A: Pt presents  to group with flat affect, stating she is very fatigued. Pt engaged in partner activity well. She shares that blaming/complaining can increase relationship strain. She shares an alternative positive coping skill would be to journal. Pt also describes passivity this date, stating it causes increase dependency and does not solve the problem. She shares this can combated by solving a problem in smaller steps to become more manageable.   P: OT group will be 4x per week                    OT Education - 11/29/18 1251    Education Details  education given on stress management    Person(s) Educated  Patient    Methods  Explanation;Handout    Comprehension  Verbalized understanding       OT Short Term Goals - 11/26/18 1306      OT SHORT TERM GOAL #1   Title  Pt will be educated on strategies to improve psychosocial skills needed to participate fully in all daily, work, and leisure activities    Time  4    Period  Weeks    Status  On-going    Target Date  12/23/18      OT SHORT TERM GOAL #2   Title  Pt will apply psychosocial skills and coping mechanisms to daily activities in order to function independently and reintegrate into community    Time  4    Period  Weeks    Status  On-going    Target Date  12/23/18      OT SHORT TERM GOAL #3   Title  Pt will recall and apply 1-3 sleep hygiene strategies to improve BADL function    Time  4    Period  Weeks    Status  On-going    Target Date  12/23/18      OT SHORT TERM GOAL #4   Title  Pt will choose and/or engage in 1-3 socially engaging leisure activities to improve social participation upon reintegrating into community    Time  4    Period  Weeks    Target Date  12/23/18      OT SHORT TERM GOAL #5   Title  Pt will engage in goal setting to improve functional BADL/IADL routine upon reintegrating into community    Time  4    Period  Weeks    Status  On-going    Target Date  12/23/18               Plan -  11/29/18 1252    Occupational performance deficits (Please refer to evaluation for details):  ADL's;IADL's;Rest and Sleep;Work;Leisure;Social Participation    Body Structure / Function / Physical Skills  ADL    Cognitive Skills  Energy/Drive    Psychosocial Skills  Coping Strategies;Interpersonal Interaction;Routines and Behaviors;Habits       Patient will benefit from skilled  therapeutic intervention in order to improve the following deficits and impairments:  Body Structure / Function / Physical Skills, Cognitive Skills, Psychosocial Skills  Visit Diagnosis: Organic personality disorder  Difficulty coping    Problem List Patient Active Problem List   Diagnosis Date Noted  . Enteritis 12/24/2017  . Intractable nausea and vomiting 12/24/2017  . Epigastric pain   . Other chest pain 09/28/2017  . TIA (transient ischemic attack) 09/28/2017  . Paroxysmal SVT (supraventricular tachycardia) ([place]) 06/24/2017  . Dizziness 06/24/2017  . Weakness 06/24/2017  . Hyperglycemia 03/17/2016  . Abscess 03/17/2016  . Chest discomfort 07/10/2014  . Cellulitis 06/01/2014  . Cellulitis of right thigh 06/01/2014  . Postop check 01/19/2014  . Smoking addiction 08/29/2013  . DKA, type 1 ([place]) 05/09/2013  . Hidradenitis suppurativa 04/21/2012  . Esophageal reflux 11/17/2011  . Seasonal and perennial allergic rhinitis 05/20/2010  . BRONCHITIS, ACUTE 11/29/2007  . HYPONATREMIA 11/14/2007  . ANXIETY STATE, UNSPECIFIED 10/24/2007  . Sinus tachycardia 07/07/2007  . Diabetes mellitus type I ([place]) 04/08/2007  . Smoker 04/08/2007  . Depression 04/08/2007  . 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 MIGRAINES, HX OF 04/08/2007   Zenovia Jarred, MSOT, OTR/L Behavioral Health OT/ Acute Relief OT PHP Office: [phone]  Zenovia Jarred 11/29/2018, 12:53 PM  [facility] PARTIAL HOSPITALIZATION PROGRAM [place], [place], [address] Phone: [phone]   Fax:   [phone]  Name: ALIZAY BRONKEMA MRN: [id number] Date of Birth: 1980-12-02

## 2018-11-30 ENCOUNTER — Other Ambulatory Visit (HOSPITAL_COMMUNITY): Payer: 59 | Admitting: Licensed Clinical Social Worker

## 2018-11-30 ENCOUNTER — Other Ambulatory Visit (HOSPITAL_COMMUNITY): Payer: 59 | Admitting: Occupational Therapy

## 2018-11-30 ENCOUNTER — Encounter (HOSPITAL_COMMUNITY): Payer: Self-pay

## 2018-11-30 DIAGNOSIS — F070 Personality change due to known physiological condition: Secondary | ICD-10-CM

## 2018-11-30 DIAGNOSIS — K219 Gastro-esophageal reflux disease without esophagitis: Secondary | ICD-10-CM | POA: Diagnosis not present

## 2018-11-30 DIAGNOSIS — F332 Major depressive disorder, recurrent severe without psychotic features: Secondary | ICD-10-CM

## 2018-11-30 DIAGNOSIS — R4589 Other symptoms and signs involving emotional state: Secondary | ICD-10-CM | POA: Diagnosis not present

## 2018-11-30 DIAGNOSIS — J45909 Unspecified asthma, uncomplicated: Secondary | ICD-10-CM | POA: Diagnosis not present

## 2018-11-30 DIAGNOSIS — R011 Cardiac murmur, unspecified: Secondary | ICD-10-CM | POA: Diagnosis not present

## 2018-11-30 DIAGNOSIS — F411 Generalized anxiety disorder: Secondary | ICD-10-CM

## 2018-11-30 DIAGNOSIS — R45851 Suicidal ideations: Secondary | ICD-10-CM | POA: Diagnosis not present

## 2018-11-30 DIAGNOSIS — E119 Type 2 diabetes mellitus without complications: Secondary | ICD-10-CM | POA: Diagnosis not present

## 2018-11-30 DIAGNOSIS — F4310 Post-traumatic stress disorder, unspecified: Secondary | ICD-10-CM | POA: Diagnosis not present

## 2018-11-30 NOTE — Therapy (Signed)
[place], [place], [address] Phone: [phone]   Fax:  [phone]  Occupational Therapy Treatment  Patient Details  Name: [name] MRN: [id number] Date of Birth: [birth year] Referring Provider (OT): [name], NP   Encounter Date: 11/30/2018  OT End of Session - 11/30/18 1353    Visit Number  4    Number of Visits  16    Date for OT Re-Evaluation  12/23/18    Authorization Type  [place] [employer]    OT Start Time  1100    OT Stop Time  1200    OT Time Calculation (min)  60 min    Activity Tolerance  Patient tolerated treatment well    Behavior During Therapy  [facility] for tasks assessed/performed       Past Medical History:  Diagnosis Date  . Abscess of left axilla - PREVOTELLA BIVIA & Staph Coag Neg 07/12/2012   MODERATE PREVOTELLA BIVIA Note: BETA LACTAMASE NEGATIVE    . Asthma    as a child  . Cancer ([place])    skin tag rt breast  . Cellulitis 06/01/2014   RT INNER THIGH  . Depression    hx  . Diabetes ([place])   . Diabetes mellitus    IDDM, Insulin pump; followed by Dr. [name]  . GERD (gastroesophageal reflux disease)    no current meds.  . Heart murmur   . Hidradenitis 04/2012   bilat. thighs, left groin - open areas on thighs  . Hx MRSA infection   . Hydradenitis   . PCOS (polycystic ovarian syndrome)   . Pneumonia    hx  . SVT (supraventricular tachycardia) ([place]) 06/2017  . Vitamin D insufficiency     Past Surgical History:  Procedure Laterality Date  . BREAST SURGERY     right lumpectomy  . BUNIONECTOMY     left  . CARDIAC CATHETERIZATION    . CHOLECYSTECTOMY  12/02/2006   lap. chole.  Marland Kitchen DILATION AND EVACUATION  02/14/2007  . ESOPHAGOGASTRODUODENOSCOPY  11/17/2011   Procedure: ESOPHAGOGASTRODUODENOSCOPY (EGD);  Surgeon: [name], MD;  [facility];  Service: Endoscopy;  Laterality: N/A;  . EYE SURGERY     exc. stye left eye  .  HYDRADENITIS EXCISION  04/30/2012   Procedure: EXCISION HYDRADENITIS GROIN;  Surgeon: [name], MD;  Location: [place];  Service: General;  Laterality: Left;  wide excision hidradenitis bilateral thighs and Left groin  . HYDRADENITIS EXCISION Left 01/13/2014   Procedure: WIDE EXCISION HIDRADENITIS LEFT AXILLA;  Surgeon: [name], MD;  Location: [place];  Service: General;  Laterality: Left;  . IRRIGATION AND DEBRIDEMENT ABSCESS Right 06/02/2014   Procedure: IRRIGATION AND DEBRIDEMENT ABSCESS;  Surgeon: [name], MD;  Location: [place];  Service: General;  Laterality: Right;  . IRRIGATION AND DEBRIDEMENT ABSCESS Left 09/23/2014   Procedure: IRRIGATION AND DEBRIDEMENT ABSCESS/LEFT THIGH;  Surgeon: [name], MD;  Location: [place];  Service: General;  Laterality: Left;  . LEFT HEART CATHETERIZATION WITH CORONARY ANGIOGRAM N/A 07/14/2014   Procedure: LEFT HEART CATHETERIZATION WITH CORONARY ANGIOGRAM;  Surgeon: [name], MD;  Location: [facility] CATH LAB;  Service: Cardiovascular;  Laterality: N/A;  . TONSILLECTOMY    . WISDOM TOOTH EXTRACTION      There were no vitals filed for this visit.  Subjective Assessment - 11/30/18 1352    Currently in Pain?  Other (Comment)   chronic pain  S: "I need more me time"  O: Stress management tool worksheet continued from previous date, to educate on unhealthy vs healthy coping skills to manage stress to improve community integration. Coping strategies taught include: relaxation based- deep breathing, counting to 10, taking a 1 minute vacation, acceptance, stress balls, relaxation audio/video, visual/mental imagery. Positive mental attitude- gratitude, acceptance, cognitive reframing, positive self talk, anger management. Positive affirmations given at end of session, as opportunity for pt to create 10 personal affirmations on their own time.    A: Pt presents to group with blunted affect, engaged and  participatory throughout entirety of session. Pt shares that 'me time' is her preferred coping mechanism to implement this date. She also was asking/becomming educated on how to use appropriate skills to manage panic attacks.    P: OT Group will be x4 per week while pt in PHP.                 OT Education - 11/30/18 1352    Education Details  continued education given on stress management skills    Person(s) Educated  Patient    Methods  Explanation;Handout    Comprehension  Verbalized understanding       OT Short Term Goals - 11/26/18 1306      OT SHORT TERM GOAL #1   Title  Pt will be educated on strategies to improve psychosocial skills needed to participate fully in all daily, work, and leisure activities    Time  4    Period  Weeks    Status  On-going    Target Date  12/23/18      OT SHORT TERM GOAL #2   Title  Pt will apply psychosocial skills and coping mechanisms to daily activities in order to function independently and reintegrate into community    Time  4    Period  Weeks    Status  On-going    Target Date  12/23/18      OT SHORT TERM GOAL #3   Title  Pt will recall and apply 1-3 sleep hygiene strategies to improve BADL function    Time  4    Period  Weeks    Status  On-going    Target Date  12/23/18      OT SHORT TERM GOAL #4   Title  Pt will choose and/or engage in 1-3 socially engaging leisure activities to improve social participation upon reintegrating into community    Time  4    Period  Weeks    Target Date  12/23/18      OT SHORT TERM GOAL #5   Title  Pt will engage in goal setting to improve functional BADL/IADL routine upon reintegrating into community    Time  4    Period  Weeks    Status  On-going    Target Date  12/23/18               Plan - 11/30/18 1354    Occupational performance deficits (Please refer to evaluation for details):  ADL's;IADL's;Rest and Sleep;Work;Leisure;Social Participation    Body Structure /  Function / Physical Skills  ADL    Cognitive Skills  Energy/Drive    Psychosocial Skills  Coping Strategies;Interpersonal Interaction;Routines and Behaviors;Habits    Rehab Potential  Good       Patient will benefit from skilled therapeutic intervention in order to improve the following deficits and impairments:  Body Structure / Function / Physical Skills, Cognitive Skills, Psychosocial Skills  Visit Diagnosis: Organic personality disorder  Difficulty coping    Problem List Patient Active Problem List   Diagnosis Date Noted  . Enteritis 12/24/2017  . Intractable nausea and vomiting 12/24/2017  . Epigastric pain   . Other chest pain 09/28/2017  . TIA (transient ischemic attack) 09/28/2017  . Paroxysmal SVT (supraventricular tachycardia) ([place]) 06/24/2017  . Dizziness 06/24/2017  . Weakness 06/24/2017  . Hyperglycemia 03/17/2016  . Abscess 03/17/2016  . Chest discomfort 07/10/2014  . Cellulitis 06/01/2014  . Cellulitis of right thigh 06/01/2014  . Postop check 01/19/2014  . Smoking addiction 08/29/2013  . DKA, type 1 ([place]) 05/09/2013  . Hidradenitis suppurativa 04/21/2012  . Esophageal reflux 11/17/2011  . Seasonal and perennial allergic rhinitis 05/20/2010  . BRONCHITIS, ACUTE 11/29/2007  . HYPONATREMIA 11/14/2007  . ANXIETY STATE, UNSPECIFIED 10/24/2007  . Sinus tachycardia 07/07/2007  . Diabetes mellitus type I ([place]) 04/08/2007  . Smoker 04/08/2007  . Depression 04/08/2007  . 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 MIGRAINES, HX OF 04/08/2007   Zenovia Jarred, MSOT, OTR/L Behavioral Health OT/ Acute Relief OT PHP Office: [phone]  Zenovia Jarred 11/30/2018, 1:57 PM  [facility] PARTIAL HOSPITALIZATION PROGRAM [place], [place], [address] Phone: [phone]   Fax:  [phone]  Name: [name] MRN: [id number] Date of Birth: [birth year]

## 2018-12-01 ENCOUNTER — Other Ambulatory Visit (HOSPITAL_COMMUNITY): Payer: 59 | Admitting: Licensed Clinical Social Worker

## 2018-12-01 ENCOUNTER — Encounter (HOSPITAL_COMMUNITY): Payer: Self-pay

## 2018-12-01 ENCOUNTER — Other Ambulatory Visit: Payer: Self-pay

## 2018-12-01 VITALS — BP 116/74 | HR 84 | Ht 64.5 in | Wt 218.0 lb

## 2018-12-01 DIAGNOSIS — R011 Cardiac murmur, unspecified: Secondary | ICD-10-CM | POA: Diagnosis not present

## 2018-12-01 DIAGNOSIS — F332 Major depressive disorder, recurrent severe without psychotic features: Secondary | ICD-10-CM | POA: Diagnosis not present

## 2018-12-01 DIAGNOSIS — F411 Generalized anxiety disorder: Secondary | ICD-10-CM | POA: Diagnosis not present

## 2018-12-01 DIAGNOSIS — J45909 Unspecified asthma, uncomplicated: Secondary | ICD-10-CM | POA: Diagnosis not present

## 2018-12-01 DIAGNOSIS — F4310 Post-traumatic stress disorder, unspecified: Secondary | ICD-10-CM | POA: Diagnosis not present

## 2018-12-01 DIAGNOSIS — E119 Type 2 diabetes mellitus without complications: Secondary | ICD-10-CM | POA: Diagnosis not present

## 2018-12-01 DIAGNOSIS — R45851 Suicidal ideations: Secondary | ICD-10-CM | POA: Diagnosis not present

## 2018-12-01 DIAGNOSIS — K219 Gastro-esophageal reflux disease without esophagitis: Secondary | ICD-10-CM | POA: Diagnosis not present

## 2018-12-01 DIAGNOSIS — R4589 Other symptoms and signs involving emotional state: Secondary | ICD-10-CM | POA: Diagnosis not present

## 2018-12-01 NOTE — Progress Notes (Signed)
Patient presented with restricted affect, anxious mood but denied any suicidal or homicidal ideations, no auditory or visual hallucinations and no plan or intent to want to harm self or others at this time.  Patient reported she did finally sleep a little better the previous night after taking increased Trazodone of 100 mg at bedtime she states Barbara Ala, NP told her to increase to taking.  States she slept 6 hours that was not broken and even though still some dreams that are quite vivid these were not scary and she slept better over all.  Patient reviewed all medications with this nurse and denies any side effects to any of them at present.  Patient rated her depression a 7, anxiety a 9 and hopelessness a 6 on a scale of 0-10 with 0 being none and hopeful and 10 being the worst she could manage and not hopeful.  Patient stated feeling PHP group was helping and wanted to make sure she would be able to stepdown later to IOP group.  Patient agreed to keep a log of sleep and if dreams became worse or caused problems to let us know.  Patient encouraged to eat more protein and reviewed diet as she states low appetite and has lost 3 pounds in the past week due to lack of appetite.  Patient stable at this time per her report and agreed to keep this nurse and [facility] staff informed of any worsening of symptoms or problems with medications.

## 2018-12-01 NOTE — Progress Notes (Signed)
Patient presented with restricted affect, anxious mood but denied any suicidal or homicidal ideations at this time, no auditory or visual hallucinations and no plan or intent to want to harm self or others.  Patient rated her depression a 7, anxiety a 9, and hopelessness a 6 on a scale of 0-10 with 0 being none and hopeful and 10 the worst she could manage and no hope.  Patient stated she did finally sleep some the previous night, 6 hours and more sound, as has been sleeping only 3-4 a night, broken and a lot of bad and vivid dreams.  Patient reported taking increased Trazodone 100 mg per instruction of [name], NP and that this helped.  States still some vivid dreams but slept better.  Patient reported no other concerns at this time and reviewed all medications.  Patient encouraged to keep a log regarding sleep and to inform this nurse or PHP staff if any worsening of symptoms or problems with medications.  Patient is hopeful she can stepdown to IOP after PHP as reports feels groups have been helpful.  Patient encouraged to eat better meals and more protein as has lost 3 pounds over the past week and reports lack of appetite as the primary reason.  We will continue to monitor and patient will report on this as well.

## 2018-12-02 ENCOUNTER — Other Ambulatory Visit (HOSPITAL_COMMUNITY): Payer: 59 | Admitting: Occupational Therapy

## 2018-12-02 ENCOUNTER — Other Ambulatory Visit (HOSPITAL_COMMUNITY): Payer: 59 | Admitting: Licensed Clinical Social Worker

## 2018-12-02 ENCOUNTER — Other Ambulatory Visit: Payer: Self-pay

## 2018-12-02 ENCOUNTER — Encounter (HOSPITAL_COMMUNITY): Payer: Self-pay

## 2018-12-02 DIAGNOSIS — J45909 Unspecified asthma, uncomplicated: Secondary | ICD-10-CM | POA: Diagnosis not present

## 2018-12-02 DIAGNOSIS — E119 Type 2 diabetes mellitus without complications: Secondary | ICD-10-CM | POA: Diagnosis not present

## 2018-12-02 DIAGNOSIS — R45851 Suicidal ideations: Secondary | ICD-10-CM | POA: Diagnosis not present

## 2018-12-02 DIAGNOSIS — F411 Generalized anxiety disorder: Secondary | ICD-10-CM

## 2018-12-02 DIAGNOSIS — F4310 Post-traumatic stress disorder, unspecified: Secondary | ICD-10-CM | POA: Diagnosis not present

## 2018-12-02 DIAGNOSIS — R4589 Other symptoms and signs involving emotional state: Secondary | ICD-10-CM

## 2018-12-02 DIAGNOSIS — R011 Cardiac murmur, unspecified: Secondary | ICD-10-CM | POA: Diagnosis not present

## 2018-12-02 DIAGNOSIS — F332 Major depressive disorder, recurrent severe without psychotic features: Secondary | ICD-10-CM

## 2018-12-02 DIAGNOSIS — K219 Gastro-esophageal reflux disease without esophagitis: Secondary | ICD-10-CM | POA: Diagnosis not present

## 2018-12-02 DIAGNOSIS — F070 Personality change due to known physiological condition: Secondary | ICD-10-CM

## 2018-12-02 NOTE — Therapy (Signed)
[place], [place], [address] Phone: [phone]   Fax:  [phone]  Occupational Therapy Treatment  Patient Details  Name: [name] MRN: [id number] Date of Birth: [birth year] Referring Provider (OT): [name], NP   Encounter Date: 12/02/2018  OT End of Session - 12/02/18 1809    Visit Number  5    Number of Visits  16    Date for OT Re-Evaluation  12/23/18    Authorization Type  [place] [employer]    OT Start Time  1100    OT Stop Time  1200    OT Time Calculation (min)  60 min    Activity Tolerance  Patient tolerated treatment well    Behavior During Therapy  [facility] for tasks assessed/performed       Past Medical History:  Diagnosis Date  . Abscess of left axilla - PREVOTELLA BIVIA & Staph Coag Neg 07/12/2012   MODERATE PREVOTELLA BIVIA Note: BETA LACTAMASE NEGATIVE    . Asthma    as a child  . Cancer ([place])    skin tag rt breast  . Cellulitis 06/01/2014   RT INNER THIGH  . Depression    hx  . Diabetes ([place])   . Diabetes mellitus    IDDM, Insulin pump; followed by Dr. [name]  . GERD (gastroesophageal reflux disease)    no current meds.  . Heart murmur   . Hidradenitis 04/2012   bilat. thighs, left groin - open areas on thighs  . Hx MRSA infection   . Hydradenitis   . PCOS (polycystic ovarian syndrome)   . Pneumonia    hx  . SVT (supraventricular tachycardia) ([place]) 06/2017  . Vitamin D insufficiency     Past Surgical History:  Procedure Laterality Date  . BREAST SURGERY     right lumpectomy  . BUNIONECTOMY     left  . CARDIAC CATHETERIZATION    . CHOLECYSTECTOMY  12/02/2006   lap. chole.  Marland Kitchen DILATION AND EVACUATION  02/14/2007  . ESOPHAGOGASTRODUODENOSCOPY  11/17/2011   Procedure: ESOPHAGOGASTRODUODENOSCOPY (EGD);  Surgeon: [name], MD;  Location: [facility];  Service: Endoscopy;  Laterality: N/A;  . EYE SURGERY     exc. stye left eye  .  HYDRADENITIS EXCISION  04/30/2012   Procedure: EXCISION HYDRADENITIS GROIN;  Surgeon: [name], MD;  Location: [facility];  Service: General;  Laterality: Left;  wide excision hidradenitis bilateral thighs and Left groin  . HYDRADENITIS EXCISION Left 01/13/2014   Procedure: WIDE EXCISION HIDRADENITIS LEFT AXILLA;  Surgeon: [name], MD;  Location: [facility];  Service: General;  Laterality: Left;  . IRRIGATION AND DEBRIDEMENT ABSCESS Right 06/02/2014   Procedure: IRRIGATION AND DEBRIDEMENT ABSCESS;  Surgeon: [name], MD;  Location: [facility];  Service: General;  Laterality: Right;  . IRRIGATION AND DEBRIDEMENT ABSCESS Left 09/23/2014   Procedure: IRRIGATION AND DEBRIDEMENT ABSCESS/LEFT THIGH;  Surgeon: [name], MD;  Location: [facility];  Service: General;  Laterality: Left;  . LEFT HEART CATHETERIZATION WITH CORONARY ANGIOGRAM N/A 07/14/2014   Procedure: LEFT HEART CATHETERIZATION WITH CORONARY ANGIOGRAM;  Surgeon: [name], MD;  Location: [facility];  Service: Cardiovascular;  Laterality: N/A;  . TONSILLECTOMY    . WISDOM TOOTH EXTRACTION      There were no vitals filed for this visit.  Subjective Assessment - 12/02/18 1809    Currently in Pain?  Other (Comment)   chronic pain  S: "I am not currently managing my routine well"   O: Education given on routine management and its importance in increasing BADL/IADL function upon reintegrating into community. Pt asked to explore their ideal routines from the past and what they want to look like in the future. Pt then given handouts on how to build a routine and 10 tips to improve functional routines. Pt asked to choose one new strategy to implement in routine management this date.  A: Pt presents with flat affect, engaged and participatory throughout session. Pt shares that she currently does not have a functional routine in her life. After receiving education, pt states she would like to implement a  daily structured schedule with her and her children. She also states how she is going to help include her daughter in cooking and housework.  P: OT group will be x4 per week while pt in PHP                   OT Education - 12/02/18 1809    Education Details  education given on routine mangement    Person(s) Educated  Patient    Methods  Explanation;Handout    Comprehension  Verbalized understanding       OT Short Term Goals - 11/26/18 1306      OT SHORT TERM GOAL #1   Title  Pt will be educated on strategies to improve psychosocial skills needed to participate fully in all daily, work, and leisure activities    Time  4    Period  Weeks    Status  On-going    Target Date  12/23/18      OT SHORT TERM GOAL #2   Title  Pt will apply psychosocial skills and coping mechanisms to daily activities in order to function independently and reintegrate into community    Time  4    Period  Weeks    Status  On-going    Target Date  12/23/18      OT SHORT TERM GOAL #3   Title  Pt will recall and apply 1-3 sleep hygiene strategies to improve BADL function    Time  4    Period  Weeks    Status  On-going    Target Date  12/23/18      OT SHORT TERM GOAL #4   Title  Pt will choose and/or engage in 1-3 socially engaging leisure activities to improve social participation upon reintegrating into community    Time  4    Period  Weeks    Target Date  12/23/18      OT SHORT TERM GOAL #5   Title  Pt will engage in goal setting to improve functional BADL/IADL routine upon reintegrating into community    Time  4    Period  Weeks    Status  On-going    Target Date  12/23/18               Plan - 12/02/18 1810    Occupational performance deficits (Please refer to evaluation for details):  ADL's;IADL's;Rest and Sleep;Work;Leisure;Social Participation    Body Structure / Function / Physical Skills  ADL    Cognitive Skills  Energy/Drive    Psychosocial Skills  Coping  Strategies;Interpersonal Interaction;Routines and Behaviors;Habits       Patient will benefit from skilled therapeutic intervention in order to improve the following deficits and impairments:  Body Structure / Function / Physical Skills, Cognitive Skills, Psychosocial Skills  Visit  Diagnosis: Organic personality disorder  Difficulty coping    Problem List Patient Active Problem List   Diagnosis Date Noted  . Enteritis 12/24/2017  . Intractable nausea and vomiting 12/24/2017  . Epigastric pain   . Other chest pain 09/28/2017  . TIA (transient ischemic attack) 09/28/2017  . Paroxysmal SVT (supraventricular tachycardia) ([place]) 06/24/2017  . Dizziness 06/24/2017  . Weakness 06/24/2017  . Hyperglycemia 03/17/2016  . Abscess 03/17/2016  . Chest discomfort 07/10/2014  . Cellulitis 06/01/2014  . Cellulitis of right thigh 06/01/2014  . Postop check 01/19/2014  . Smoking addiction 08/29/2013  . DKA, type 1 ([place]) 05/09/2013  . Hidradenitis suppurativa 04/21/2012  . Esophageal reflux 11/17/2011  . Seasonal and perennial allergic rhinitis 05/20/2010  . BRONCHITIS, ACUTE 11/29/2007  . HYPONATREMIA 11/14/2007  . ANXIETY STATE, UNSPECIFIED 10/24/2007  . Sinus tachycardia 07/07/2007  . Diabetes mellitus type I ([place]) 04/08/2007  . Smoker 04/08/2007  . Depression 04/08/2007  . 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 MIGRAINES, HX OF 04/08/2007   Zenovia Jarred, MSOT, OTR/L Behavioral Health OT/ Acute Relief OT PHP Office: [phone]  Zenovia Jarred 12/02/2018, [facility] [place], [place], [address] Phone: [phone]   Fax:  [phone]  Name: BLIMI GODBY MRN: [id number] Date of Birth: Sep 21, 1981

## 2018-12-03 ENCOUNTER — Other Ambulatory Visit: Payer: Self-pay

## 2018-12-03 ENCOUNTER — Other Ambulatory Visit (HOSPITAL_COMMUNITY): Payer: 59

## 2018-12-03 ENCOUNTER — Other Ambulatory Visit (HOSPITAL_COMMUNITY): Payer: 59 | Admitting: Licensed Clinical Social Worker

## 2018-12-03 DIAGNOSIS — F4310 Post-traumatic stress disorder, unspecified: Secondary | ICD-10-CM | POA: Diagnosis not present

## 2018-12-03 DIAGNOSIS — F332 Major depressive disorder, recurrent severe without psychotic features: Secondary | ICD-10-CM

## 2018-12-03 DIAGNOSIS — E119 Type 2 diabetes mellitus without complications: Secondary | ICD-10-CM | POA: Diagnosis not present

## 2018-12-03 DIAGNOSIS — J45909 Unspecified asthma, uncomplicated: Secondary | ICD-10-CM | POA: Diagnosis not present

## 2018-12-03 DIAGNOSIS — K219 Gastro-esophageal reflux disease without esophagitis: Secondary | ICD-10-CM | POA: Diagnosis not present

## 2018-12-03 DIAGNOSIS — F411 Generalized anxiety disorder: Secondary | ICD-10-CM

## 2018-12-03 DIAGNOSIS — R4589 Other symptoms and signs involving emotional state: Secondary | ICD-10-CM | POA: Diagnosis not present

## 2018-12-03 DIAGNOSIS — R011 Cardiac murmur, unspecified: Secondary | ICD-10-CM | POA: Diagnosis not present

## 2018-12-03 DIAGNOSIS — R45851 Suicidal ideations: Secondary | ICD-10-CM | POA: Diagnosis not present

## 2018-12-05 ENCOUNTER — Encounter (HOSPITAL_COMMUNITY): Payer: Self-pay | Admitting: Family

## 2018-12-05 NOTE — Progress Notes (Signed)
BH MD/PA/NP OP Progress Note PHP  12/05/2018 5:53 PM [name]  MRN:  [id number]   Evaluation: [name] observed attending daily group sessions with active and engaged participation.  Patient presents flat but pleasant.  States her depression is a 8 out of 10 with 10 being the worst.  Reports she has not been resting well at night due to nightmares and racing thoughts.  Patient continues to express " I know that I am going to be fired after treatment here."  Reports these thoughts is causing worsening anxiety.  She denies suicidal homicidal ideations.  Denies auditory or visual hallucinations.  Discussed titration to trazodone from 50 mg to 100 mg.  Patient appeared to be receptive to the treatment plan.  Patient to continue daily group sessions for additional coping skills with anxiety reported symptoms.  Support, encouragement reassurance was provided.  History: [name] is a 38 y.o. Caucasian female presents worsening depression.  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  Cyerra reports her symptoms started roughly about 3 weeks ago.  States she she became stressed and overwhelmed because she is responsible for the actions of other employees.  Laylana reports a fear that she will be fired due to 1 of her coworker money drawer was out of balance. "they will blam me because I am the lead."  States she is has been approved for FMLA however is unable to find documents to prove her absences on a few occasions.  Currently denying suicidal or homicidal ideations.  States she is being followed by psychiatry in the past when she was prescribed Viibryd 40 mg states she is currently taking and tolerating medications well.    Visit Diagnosis:    ICD-10-CM   1. 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 GAD (generalized anxiety disorder) F41.1     Past Psychiatric History:   Past Medical History:   Past Medical History:  Diagnosis Date  . Abscess of left axilla - PREVOTELLA BIVIA & Staph Coag Neg 07/12/2012   MODERATE PREVOTELLA BIVIA Note: BETA LACTAMASE NEGATIVE    . Asthma    as a child  . Cancer ([place])    skin tag rt breast  . Cellulitis 06/01/2014   RT INNER THIGH  . Depression    hx  . Diabetes ([place])   . Diabetes mellitus    IDDM, Insulin pump; followed by Dr. [name]  . GERD (gastroesophageal reflux disease)    no current meds.  . Heart murmur   . Hidradenitis 04/2012   bilat. thighs, left groin - open areas on thighs  . Hx MRSA infection   . Hydradenitis   . PCOS (polycystic ovarian syndrome)   . Pneumonia    hx  . SVT (supraventricular tachycardia) ([place]) 06/2017  . Vitamin D insufficiency     Past Surgical History:  Procedure Laterality Date  . BREAST SURGERY     right lumpectomy  . BUNIONECTOMY     left  . CARDIAC CATHETERIZATION    . CHOLECYSTECTOMY  12/02/2006   lap. chole.  Marland Kitchen DILATION AND EVACUATION  02/14/2007  . ESOPHAGOGASTRODUODENOSCOPY  11/17/2011   Procedure: ESOPHAGOGASTRODUODENOSCOPY (EGD);  Surgeon: [name], MD;  Location: [facility];  Service: Endoscopy;  Laterality: N/A;  . EYE SURGERY     exc. stye left eye  . HYDRADENITIS EXCISION  04/30/2012   Procedure: EXCISION HYDRADENITIS GROIN;  Surgeon: [name],  MD;  Location: [facility];  Service: General;  Laterality: Left;  wide excision hidradenitis bilateral thighs and Left groin  . HYDRADENITIS EXCISION Left 01/13/2014   Procedure: WIDE EXCISION HIDRADENITIS LEFT AXILLA;  Surgeon: [name], MD;  Location: [facility];  Service: General;  Laterality: Left;  . IRRIGATION AND DEBRIDEMENT ABSCESS Right 06/02/2014   Procedure: IRRIGATION AND DEBRIDEMENT ABSCESS;  Surgeon: [name], MD;  Location: [facility];  Service: General;  Laterality: Right;  . IRRIGATION AND DEBRIDEMENT ABSCESS Left 09/23/2014   Procedure: IRRIGATION AND DEBRIDEMENT ABSCESS/LEFT  THIGH;  Surgeon: [name], MD;  Location: [facility];  Service: General;  Laterality: Left;  . LEFT HEART CATHETERIZATION WITH CORONARY ANGIOGRAM N/A 07/14/2014   Procedure: LEFT HEART CATHETERIZATION WITH CORONARY ANGIOGRAM;  Surgeon: [name], MD;  Location: [facility];  Service: Cardiovascular;  Laterality: N/A;  . TONSILLECTOMY    . WISDOM TOOTH EXTRACTION      Family Psychiatric History:   Family History:  Family History  Problem Relation Age of Onset  . Cancer Mother   . Anxiety disorder Mother   . Depression Mother   . Sexual abuse Mother   . Hyperlipidemia Sister   . Hypertension Sister   . Sexual abuse Sister   . Hypertension Father   . Anxiety disorder Father   . Depression Father   . Drug abuse Father   . Stroke Paternal Uncle   . ADD / ADHD Paternal Uncle   . Anxiety disorder Paternal Uncle   . Anxiety disorder Maternal Aunt   . Seizures Maternal Aunt   . Anxiety disorder Paternal Aunt   . Anxiety disorder Maternal Uncle   . ADD / ADHD Cousin   . ADD / ADHD Daughter     Social History:  Social History   Socioeconomic History  . Marital status: Single    Spouse name: Not on file  . Number of children: Not on file  . Years of education: Not on file  . Highest education level: Not on file  Occupational History  . Not on file  Social Needs  . Financial resource strain: Somewhat hard  . Food insecurity:    Worry: Often true    Inability: Sometimes true  . Transportation needs:    Medical: No    Non-medical: No  Tobacco Use  . Smoking status: Current Every Day Smoker    Packs/day: 1.00    Years: 23.00    Pack years: 23.00    Types: Cigarettes  . Smokeless tobacco: Never Used  . Tobacco comment: Recently restarted due to reported anxiety.  Substance and Sexual Activity  . Alcohol use: No    Alcohol/week: 0.0 standard drinks  . Drug use: Yes    Frequency: 1.0 times per week    Types: Marijuana    Comment: Reports used marijuana 1 time 2  days ago but does not use regularly   . Sexual activity: Yes    Partners: Male    Birth control/protection: I.U.D.  Lifestyle  . Physical activity:    Days per week: 0 days    Minutes per session: 0 min  . Stress: Very much  Relationships  . Social connections:    Talks on phone: Twice a week    Gets together: Once a week    Attends religious service: 1 to 4 times per year    Active member of club or organization: Yes    Attends meetings of clubs or organizations: Not  on file    Relationship status: Living with partner  Other Topics Concern  . Not on file  Social History Narrative  . Not on file    Allergies:  Allergies  Allergen Reactions  . Latex Hives, Shortness Of Breath and Rash  . Levofloxacin Shortness Of Breath and Rash  . Moxifloxacin Shortness Of Breath and Rash  . Oxycodone-Acetaminophen Shortness Of Breath, Swelling and Rash    NORCO/VICODIN OK  . Peach [Prunus Persica] Hives and Shortness Of Breath  . Potassium-Containing Compounds Other (See Comments)    IV ROUTE - CAUSES VEINS TO COLLAPS; Reports that it is undiluted K only  . Prednisone Other (See Comments)    SEVERE ELEVATION OF BLOOD SUGAR. Able to tolerate 40 mg  . Propoxyphene N-Acetaminophen Swelling    SWELLING OF FACE AND THROAT  . Rosiglitazone Maleate Swelling    SWELLING OF FACE AND LEGS  . Xolair [Omalizumab] Other (See Comments)    Rash and anaphylaxis  . Adhesive [Tape] Hives, Itching and Rash  . Morphine And Related Other (See Comments)    Causes hallucinations  . Prozac [Fluoxetine Hcl] Other (See Comments)    Made her very aggressive   . Chantix [Varenicline]     dreams  . Citrullus Vulgaris Nausea And Vomiting    Facial swelling  . Clindamycin/Lincomycin   . Humira [Adalimumab]     Does not remember   . Septra [Sulfamethoxazole-Trimethoprim]   . Cefaclor Rash  . Keflex [Cephalexin] Diarrhea and Rash    REACTION: severe migraine  . Promethazine Hcl Other (See Comments)    IV  ROUTE ONLY - JITTERY FEELING. Patient reports that it is mild and she has used promethazine since then  PO tablet ok  . Sulfadiazine Rash    Metabolic Disorder Labs: Lab Results  Component Value Date   HGBA1C 10.6 (H) 10/04/2018   MPG 280.48 12/25/2017   MPG 246 (H) 06/01/2014   No results found for: PROLACTIN Lab Results  Component Value Date   CHOL 193 03/22/2018   TRIG 90.0 03/22/2018   HDL 62.80 03/22/2018   CHOLHDL 3 03/22/2018   VLDL 18.0 03/22/2018   LDLCALC 113 (H) 03/22/2018   LDLCALC 88 12/19/2015   Lab Results  Component Value Date   TSH 1.43 03/22/2018   TSH 0.773 12/25/2017    Therapeutic Level Labs: No results found for: LITHIUM No results found for: VALPROATE No components found for:  CBMZ  Current Medications: Current Outpatient Medications  Medication Sig Dispense Refill  . ALPRAZolam (XANAX) 0.25 MG tablet Take 0.25 mg by mouth 2 (two) times daily as needed for anxiety.   2  . cyclobenzaprine (FLEXERIL) 5 MG tablet Take 1 tablet (5 mg total) by mouth at bedtime as needed for muscle spasms. (Patient not taking: Reported on 11/24/2018) 30 tablet 0  . doxycycline (VIBRA-TABS) 100 MG tablet Take 1 tablet (100 mg total) by mouth 2 (two) times daily. 14 tablet 0  . folic acid (FOLVITE) 1 MG tablet Take 1 mg by mouth daily.  5  . glucose blood ([employer] TEST STRIPS) test strip 1 strip by Misc.(Non-Drug; Combo Route) route 4 times daily with meals and nightly. contour next glucose test strips    . HUMALOG 100 UNIT/ML injection     . hydrOXYzine (VISTARIL) 25 MG capsule Take 1 capsule (25 mg total) by mouth 3 (three) times daily as needed. 30 capsule 0  . insulin glargine (LANTUS) 100 UNIT/ML injection Inject 60 units [place] as directed  10 mL 5  . Insulin Human (INSULIN PUMP) SOLN Inject into the skin continuous. *Novolog*    . levonorgestrel (MIRENA) 20 MCG/24HR IUD 1 each by Intrauterine route once. Placed 04/2013    . mupirocin ointment (BACTROBAN) 2 % Apply 1  application topically daily as needed.  2  . ondansetron (ZOFRAN) 4 MG tablet 1 tab every 4-8 hours for nausea (Patient not taking: Reported on 09/08/2018) 10 tablet 5  . pantoprazole (PROTONIX) 40 MG tablet Take 1 tablet (40 mg total) by mouth daily. 90 tablet 4  . spironolactone (ALDACTONE) 100 MG tablet Take 1 tablet (100 mg total) by mouth daily. 30 tablet 3  . traZODone (DESYREL) 50 MG tablet Take 1 tablet (50 mg total) by mouth at bedtime. 30 tablet 0  . varenicline (CHANTIX PAK) 0.5 MG X 11 & 1 MG X 42 tablet Take one 0.5 mg tablet by mouth once daily for 3 days, then increase to one 0.5 mg tablet twice daily for 4 days, then increase to one 1 mg tablet twice daily. (Patient not taking: Reported on 09/08/2018) 53 tablet 0  . VIIBRYD 40 MG TABS TAKE 1 TABLET BY MOUTH ONCE DAILY 30 tablet 6   No current facility-administered medications for this visit.      Musculoskeletal: Strength & Muscle Tone: within normal limits Gait & Station: normal Patient leans: N/A  Psychiatric Specialty Exam: Review of Systems  Psychiatric/Behavioral: Positive for depression. Negative for substance abuse and suicidal ideas. The patient is nervous/anxious and has insomnia.   All other systems reviewed and are negative.   There were no vitals taken for this visit.There is no height or weight on file to calculate BMI.  General Appearance: Casual and Guarded  Eye Contact:  Good  Speech:  Clear and Coherent  Volume:  Normal  Mood:  Anxious and Depressed  Affect:  Congruent  Thought Process:  Coherent  Orientation:  Full (Time, Place, and Person)  Thought Content: Rumination   Suicidal Thoughts:  No  Homicidal Thoughts:  No  Memory:  Immediate;   Fair Recent;   Fair Remote;   Fair  Judgement:  Fair  Insight:  Fair  Psychomotor Activity:  Normal  Concentration:  Concentration: Fair  Recall:  [employer] of Knowledge: Fair  Language: Fair  Akathisia:  No  Handed:  Right  AIMS (if indicated):    Assets:  Communication Skills Desire for Improvement Resilience Social Support  ADL's:  Intact  Cognition: WNL  Sleep:  Poor   Screenings: GAD-7     Counselor from 11/24/2018 in [place]  Total GAD-7 Score  19    PHQ2-9     Counselor from 11/24/2018 in [facility] from 03/14/2015 in [place]  PHQ-2 Total Score  6  1  PHQ-9 Total Score  24  -       Assessment and Plan:  Continue partial hospitalization programming Increase trazodone 50 mg to 100 mg p.o. nightly Continue Vistaril 25 mg p.o. as needed every 6  Treatment plan was reviewed and agreed upon by NP T. Bobby Rumpf and patient Evarose [place] need for continued group services   [place], NP 12/05/2018, 5:53 PM

## 2018-12-06 ENCOUNTER — Other Ambulatory Visit (HOSPITAL_COMMUNITY): Payer: 59 | Admitting: Occupational Therapy

## 2018-12-06 ENCOUNTER — Other Ambulatory Visit: Payer: Self-pay

## 2018-12-06 ENCOUNTER — Encounter (HOSPITAL_COMMUNITY): Payer: Self-pay | Admitting: Occupational Therapy

## 2018-12-06 ENCOUNTER — Other Ambulatory Visit (HOSPITAL_COMMUNITY): Payer: 59 | Admitting: Licensed Clinical Social Worker

## 2018-12-06 DIAGNOSIS — J45909 Unspecified asthma, uncomplicated: Secondary | ICD-10-CM | POA: Diagnosis not present

## 2018-12-06 DIAGNOSIS — R45851 Suicidal ideations: Secondary | ICD-10-CM | POA: Diagnosis not present

## 2018-12-06 DIAGNOSIS — R4589 Other symptoms and signs involving emotional state: Secondary | ICD-10-CM | POA: Diagnosis not present

## 2018-12-06 DIAGNOSIS — F411 Generalized anxiety disorder: Secondary | ICD-10-CM

## 2018-12-06 DIAGNOSIS — F070 Personality change due to known physiological condition: Secondary | ICD-10-CM

## 2018-12-06 DIAGNOSIS — K219 Gastro-esophageal reflux disease without esophagitis: Secondary | ICD-10-CM | POA: Diagnosis not present

## 2018-12-06 DIAGNOSIS — F332 Major depressive disorder, recurrent severe without psychotic features: Secondary | ICD-10-CM

## 2018-12-06 DIAGNOSIS — F4310 Post-traumatic stress disorder, unspecified: Secondary | ICD-10-CM | POA: Diagnosis not present

## 2018-12-06 DIAGNOSIS — R011 Cardiac murmur, unspecified: Secondary | ICD-10-CM | POA: Diagnosis not present

## 2018-12-06 DIAGNOSIS — E119 Type 2 diabetes mellitus without complications: Secondary | ICD-10-CM | POA: Diagnosis not present

## 2018-12-06 NOTE — Therapy (Signed)
[place], [place], [address] Phone: [phone]   Fax:  [phone]  Occupational Therapy Treatment  Patient Details  Name: [name] MRN: [id number] Date of Birth: 12-27-1980 Referring Provider (OT): [name], NP   Encounter Date: 12/06/2018  OT End of Session - 12/06/18 1239    Visit Number  6    Number of Visits  16    Date for OT Re-Evaluation  12/23/18    Authorization Type   [facility]    OT Start Time  1100    OT Stop Time  1200    OT Time Calculation (min)  60 min    Activity Tolerance  Patient tolerated treatment well    Behavior During Therapy  [facility] for tasks assessed/performed       Past Medical History:  Diagnosis Date  . Abscess of left axilla - PREVOTELLA BIVIA & Staph Coag Neg 07/12/2012   MODERATE PREVOTELLA BIVIA Note: BETA LACTAMASE NEGATIVE    . Asthma    as a child  . Cancer ([place])    skin tag rt breast  . Cellulitis 06/01/2014   RT INNER THIGH  . Depression    hx  . Diabetes ([place])   . Diabetes mellitus    IDDM, Insulin pump; followed by Dr. [name]  . GERD (gastroesophageal reflux disease)    no current meds.  . Heart murmur   . Hidradenitis 04/2012   bilat. thighs, left groin - open areas on thighs  . Hx MRSA infection   . Hydradenitis   . PCOS (polycystic ovarian syndrome)   . Pneumonia    hx  . SVT (supraventricular tachycardia) ([place]) 06/2017  . Vitamin D insufficiency     Past Surgical History:  Procedure Laterality Date  . BREAST SURGERY     right lumpectomy  . BUNIONECTOMY     left  . CARDIAC CATHETERIZATION    . CHOLECYSTECTOMY  12/02/2006   lap. chole.  Marland Kitchen DILATION AND EVACUATION  02/14/2007  . ESOPHAGOGASTRODUODENOSCOPY  11/17/2011   Procedure: ESOPHAGOGASTRODUODENOSCOPY (EGD);  Surgeon: [name], MD;  Location: [facility];  Service: Endoscopy;  Laterality: N/A;  . EYE SURGERY     exc. stye left eye  .  HYDRADENITIS EXCISION  04/30/2012   Procedure: EXCISION HYDRADENITIS GROIN;  Surgeon: [name], MD;  Location: [place];  Service: General;  Laterality: Left;  wide excision hidradenitis bilateral thighs and Left groin  . HYDRADENITIS EXCISION Left 01/13/2014   Procedure: WIDE EXCISION HIDRADENITIS LEFT AXILLA;  Surgeon: [name], MD;  Location: [place];  Service: General;  Laterality: Left;  . IRRIGATION AND DEBRIDEMENT ABSCESS Right 06/02/2014   Procedure: IRRIGATION AND DEBRIDEMENT ABSCESS;  Surgeon: [name], MD;  Location: [place];  Service: General;  Laterality: Right;  . IRRIGATION AND DEBRIDEMENT ABSCESS Left 09/23/2014   Procedure: IRRIGATION AND DEBRIDEMENT ABSCESS/LEFT THIGH;  Surgeon: [name], MD;  Location: [place];  Service: General;  Laterality: Left;  . LEFT HEART CATHETERIZATION WITH CORONARY ANGIOGRAM N/A 07/14/2014   Procedure: LEFT HEART CATHETERIZATION WITH CORONARY ANGIOGRAM;  Surgeon: [name], MD;  Location: [facility] CATH LAB;  Service: Cardiovascular;  Laterality: N/A;  . TONSILLECTOMY    . WISDOM TOOTH EXTRACTION      There were no vitals filed for this visit.  Subjective Assessment - 12/06/18 1239    Currently in Pain?  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 Pt given goal identifying worksheet to list immediate, short term, medium term, and long-term goals using a SMART goal framework (specificity, meaningful, adaptive, realistic, and time bound). Goals created as guideline for pt to practice being accountable in various situations. Pt completed work sheet of goals and encouraged to share goals with the group, with emphasis on immediate goal for check in with pt for next session to maintain accountability.    A: Pt  Presents with flat affect, appears low energy, but overall engaged and participatory throughout session. Pt completed goals work sheet using SMART goal framework, while remaining in line with values for promotion of occupational balance to help foster continuous practice of accountability skills. Pt identified immediate goal of "I am going to go to a girl scouts meeting with my daughter tonight".  She further describes goals in relation of exercise, with wanting to be back exercising 30 minutes a day 3x per week.  P: OT group will be x4 while pt in PHP.                    OT Education - 12/06/18 1239    Education Details  education given on goal setting    Person(s) Educated  Patient    Methods  Explanation;Handout    Comprehension  Verbalized understanding       OT Short Term Goals - 11/26/18 1306      OT SHORT TERM GOAL #1   Title  Pt will be educated on strategies to improve psychosocial skills needed to participate fully in all daily, work, and leisure activities    Time  4    Period  Weeks    Status  On-going    Target Date  12/23/18      OT SHORT TERM GOAL #2   Title  Pt will apply psychosocial skills and coping mechanisms to daily activities in order to function independently and reintegrate into community    Time  4    Period  Weeks    Status  On-going    Target Date  12/23/18      OT SHORT TERM GOAL #3   Title  Pt will recall and apply 1-3 sleep hygiene strategies to improve BADL function    Time  4    Period  Weeks    Status  On-going    Target Date  12/23/18      OT SHORT TERM GOAL #4   Title  Pt will choose and/or engage in 1-3 socially engaging leisure activities to improve social participation upon reintegrating into community    Time  4    Period  Weeks    Target Date  12/23/18      OT SHORT TERM GOAL #5   Title  Pt will engage in goal setting to improve functional BADL/IADL routine upon reintegrating into community    Time  4     Period  Weeks    Status  On-going    Target Date  12/23/18               Plan - 12/06/18 1239    Occupational performance deficits (Please refer to evaluation for details):  ADL's;IADL's;Rest and Sleep;Work;Leisure;Social Participation    Body Structure / Function / Physical Skills  ADL  Psychosocial Skills  Coping Strategies;Interpersonal Interaction;Routines and Behaviors;Habits       Patient will benefit from skilled therapeutic intervention in order to improve the following deficits and impairments:  Body Structure / Function / Physical Skills, Cognitive Skills, Psychosocial Skills  Visit Diagnosis: Organic personality disorder  Difficulty coping    Problem List Patient Active Problem List   Diagnosis Date Noted  . Enteritis 12/24/2017  . Intractable nausea and vomiting 12/24/2017  . Epigastric pain   . Other chest pain 09/28/2017  . TIA (transient ischemic attack) 09/28/2017  . Paroxysmal SVT (supraventricular tachycardia) ([place]) 06/24/2017  . Dizziness 06/24/2017  . Weakness 06/24/2017  . Hyperglycemia 03/17/2016  . Abscess 03/17/2016  . Chest discomfort 07/10/2014  . Cellulitis 06/01/2014  . Cellulitis of right thigh 06/01/2014  . Postop check 01/19/2014  . Smoking addiction 08/29/2013  . DKA, type 1 ([place]) 05/09/2013  . Hidradenitis suppurativa 04/21/2012  . Esophageal reflux 11/17/2011  . Seasonal and perennial allergic rhinitis 05/20/2010  . BRONCHITIS, ACUTE 11/29/2007  . HYPONATREMIA 11/14/2007  . ANXIETY STATE, UNSPECIFIED 10/24/2007  . Sinus tachycardia 07/07/2007  . Diabetes mellitus type I ([place]) 04/08/2007  . Smoker 04/08/2007  . Depression 04/08/2007  . 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 MIGRAINES, HX OF 04/08/2007   Zenovia Jarred, MSOT, OTR/L Behavioral Health OT/ Acute Relief OT PHP Office: [phone]  Zenovia Jarred 12/06/2018, 12:41 PM  [facility] PARTIAL HOSPITALIZATION PROGRAM [place]  [place], [place], [address] Phone: [phone]   Fax:  [phone]  Name: PORTER NAKAMA MRN: [id number] Date of Birth: 01-Nov-1980

## 2018-12-07 ENCOUNTER — Other Ambulatory Visit (HOSPITAL_COMMUNITY): Payer: 59

## 2018-12-07 ENCOUNTER — Other Ambulatory Visit: Payer: Self-pay

## 2018-12-07 ENCOUNTER — Other Ambulatory Visit (HOSPITAL_COMMUNITY): Payer: 59 | Admitting: Licensed Clinical Social Worker

## 2018-12-07 ENCOUNTER — Encounter (HOSPITAL_COMMUNITY): Payer: Self-pay | Admitting: Family

## 2018-12-07 DIAGNOSIS — F411 Generalized anxiety disorder: Secondary | ICD-10-CM | POA: Diagnosis not present

## 2018-12-07 DIAGNOSIS — E119 Type 2 diabetes mellitus without complications: Secondary | ICD-10-CM | POA: Diagnosis not present

## 2018-12-07 DIAGNOSIS — R011 Cardiac murmur, unspecified: Secondary | ICD-10-CM | POA: Diagnosis not present

## 2018-12-07 DIAGNOSIS — F332 Major depressive disorder, recurrent severe without psychotic features: Secondary | ICD-10-CM

## 2018-12-07 DIAGNOSIS — K219 Gastro-esophageal reflux disease without esophagitis: Secondary | ICD-10-CM | POA: Diagnosis not present

## 2018-12-07 DIAGNOSIS — R4589 Other symptoms and signs involving emotional state: Secondary | ICD-10-CM | POA: Diagnosis not present

## 2018-12-07 DIAGNOSIS — F4310 Post-traumatic stress disorder, unspecified: Secondary | ICD-10-CM | POA: Diagnosis not present

## 2018-12-07 DIAGNOSIS — R45851 Suicidal ideations: Secondary | ICD-10-CM | POA: Diagnosis not present

## 2018-12-07 DIAGNOSIS — J45909 Unspecified asthma, uncomplicated: Secondary | ICD-10-CM | POA: Diagnosis not present

## 2018-12-07 MED ORDER — PRAZOSIN HCL 1 MG PO CAPS: 1.0000 mg | ORAL_CAPSULE | Freq: Every day | ORAL | 0 refills | Status: DC

## 2018-12-07 NOTE — Progress Notes (Signed)
BH MD/PA/NP OP Progress Note  12/07/2018 11:12 AM [name]  MRN:  [id number]  [name] observed attending daily group session with active and engaged participation.  Patient presents flat, depressed but pleasant.  Reports  poor concentration and "fogginess" due to broken sleep pattern.  Reports she has vivid nightmares throughout the night.  Denies suicidal or homicidal ideations.  Denies auditory or visual hallucinations.  Rates her depression 8 out of 10 with 10 being the worst during this assessment.  Reports taking medications as prescribed and tolerating them well.  Patient was recently initiated on trazodone 50 mg p.o. nightly which she reports is helping however she is not staying asleep due to vivid dreams.  Discussed initiating Minipress for PTSD reported symptoms.  Patient was agreeable to plan. Support encouragement reassurance was provided.    History: [name] is a 38 y.o. Caucasian female presents worsening depression.  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  [name].  reports her symptoms started roughly about 3 weeks ago.  States she she became stressed and overwhelmed because she is responsible for the actions of other employees.  [name] reports a fear that she will be fired due to 1 of her coworker money drawer was out of balance. "they will blam me because I am the lead."  States she is has been approved for FMLA however is unable to find documents to prove her absences on a few occasions.  Currently denying suicidal or homicidal ideations.  States she is being followed by psychiatry in the past when she was prescribed Viibryd 40 mg states she is currently taking and tolerating medications well.  Reports using marijuana intermittently to help with her anxiety and depression.  However states worsened her symptoms  Visit Diagnosis: No diagnosis found.  Past Psychiatric History:   Past Medical History:  Past Medical  History:  Diagnosis Date  . Abscess of left axilla - PREVOTELLA BIVIA & Staph Coag Neg 07/12/2012   MODERATE PREVOTELLA BIVIA Note: BETA LACTAMASE NEGATIVE    . Asthma    as a child  . Cancer ([place])    skin tag rt breast  . Cellulitis 06/01/2014   RT INNER THIGH  . Depression    hx  . Diabetes ([place])   . Diabetes mellitus    IDDM, Insulin pump; followed by Dr. [name]  . GERD (gastroesophageal reflux disease)    no current meds.  . Heart murmur   . Hidradenitis 04/2012   bilat. thighs, left groin - open areas on thighs  . Hx MRSA infection   . Hydradenitis   . PCOS (polycystic ovarian syndrome)   . Pneumonia    hx  . SVT (supraventricular tachycardia) ([place]) 06/2017  . Vitamin D insufficiency     Past Surgical History:  Procedure Laterality Date  . BREAST SURGERY     right lumpectomy  . BUNIONECTOMY     left  . CARDIAC CATHETERIZATION    . CHOLECYSTECTOMY  12/02/2006   lap. chole.  Marland Kitchen DILATION AND EVACUATION  02/14/2007  . ESOPHAGOGASTRODUODENOSCOPY  11/17/2011   Procedure: ESOPHAGOGASTRODUODENOSCOPY (EGD);  Surgeon: [name], MD;  Location: [facility];  Service: Endoscopy;  Laterality: N/A;  . EYE SURGERY     exc. stye left eye  . HYDRADENITIS EXCISION  04/30/2012   Procedure: EXCISION HYDRADENITIS GROIN;  Surgeon: [name], MD;  Location: [place];  Service: General;  Laterality: Left;  wide excision  hidradenitis bilateral thighs and Left groin  . HYDRADENITIS EXCISION Left 01/13/2014   Procedure: WIDE EXCISION HIDRADENITIS LEFT AXILLA;  Surgeon: [name], MD;  Location: [place];  Service: General;  Laterality: Left;  . IRRIGATION AND DEBRIDEMENT ABSCESS Right 06/02/2014   Procedure: IRRIGATION AND DEBRIDEMENT ABSCESS;  Surgeon: [name], MD;  Location: [place];  Service: General;  Laterality: Right;  . IRRIGATION AND DEBRIDEMENT ABSCESS Left 09/23/2014   Procedure: IRRIGATION AND DEBRIDEMENT ABSCESS/LEFT THIGH;  Surgeon:  [name], MD;  Location: [employer];  Service: General;  Laterality: Left;  . LEFT HEART CATHETERIZATION WITH CORONARY ANGIOGRAM N/A 07/14/2014   Procedure: LEFT HEART CATHETERIZATION WITH CORONARY ANGIOGRAM;  Surgeon: [name], MD;  Location: [facility] CATH LAB;  Service: Cardiovascular;  Laterality: N/A;  . TONSILLECTOMY    . WISDOM TOOTH EXTRACTION      Family Psychiatric History:   Family History:  Family History  Problem Relation Age of Onset  . Cancer Mother   . Anxiety disorder Mother   . Depression Mother   . Sexual abuse Mother   . Hyperlipidemia Sister   . Hypertension Sister   . Sexual abuse Sister   . Hypertension Father   . Anxiety disorder Father   . Depression Father   . Drug abuse Father   . Stroke Paternal Uncle   . ADD / ADHD Paternal Uncle   . Anxiety disorder Paternal Uncle   . Anxiety disorder Maternal Aunt   . Seizures Maternal Aunt   . Anxiety disorder Paternal Aunt   . Anxiety disorder Maternal Uncle   . ADD / ADHD Cousin   . ADD / ADHD Daughter     Social History:  Social History   Socioeconomic History  . Marital status: Single    Spouse name: Not on file  . Number of children: Not on file  . Years of education: Not on file  . Highest education level: Not on file  Occupational History  . Not on file  Social Needs  . Financial resource strain: Somewhat hard  . Food insecurity:    Worry: Often true    Inability: Sometimes true  . Transportation needs:    Medical: No    Non-medical: No  Tobacco Use  . Smoking status: Current Every Day Smoker    Packs/day: 1.00    Years: 23.00    Pack years: 23.00    Types: Cigarettes  . Smokeless tobacco: Never Used  . Tobacco comment: Recently restarted due to reported anxiety.  Substance and Sexual Activity  . Alcohol use: No    Alcohol/week: 0.0 standard drinks  . Drug use: Yes    Frequency: 1.0 times per week    Types: Marijuana    Comment: Reports used marijuana 1 time 2 days ago but does  not use regularly   . Sexual activity: Yes    Partners: Male    Birth control/protection: I.U.D.  Lifestyle  . Physical activity:    Days per week: 0 days    Minutes per session: 0 min  . Stress: Very much  Relationships  . Social connections:    Talks on phone: Twice a week    Gets together: Once a week    Attends religious service: 1 to 4 times per year    Active member of club or organization: Yes    Attends meetings of clubs or organizations: Not on file    Relationship status: Living with partner  Other Topics Concern  .  Not on file  Social History Narrative  . Not on file    Allergies:  Allergies  Allergen Reactions  . Latex Hives, Shortness Of Breath and Rash  . Levofloxacin Shortness Of Breath and Rash  . Moxifloxacin Shortness Of Breath and Rash  . Oxycodone-Acetaminophen Shortness Of Breath, Swelling and Rash    NORCO/VICODIN OK  . Peach [Prunus Persica] Hives and Shortness Of Breath  . Potassium-Containing Compounds Other (See Comments)    IV ROUTE - CAUSES VEINS TO COLLAPS; Reports that it is undiluted K only  . Prednisone Other (See Comments)    SEVERE ELEVATION OF BLOOD SUGAR. Able to tolerate 40 mg  . Propoxyphene N-Acetaminophen Swelling    SWELLING OF FACE AND THROAT  . Rosiglitazone Maleate Swelling    SWELLING OF FACE AND LEGS  . Xolair [Omalizumab] Other (See Comments)    Rash and anaphylaxis  . Adhesive [Tape] Hives, Itching and Rash  . Morphine And Related Other (See Comments)    Causes hallucinations  . Prozac [Fluoxetine Hcl] Other (See Comments)    Made her very aggressive   . Chantix [Varenicline]     dreams  . Citrullus Vulgaris Nausea And Vomiting    Facial swelling  . Clindamycin/Lincomycin   . Humira [Adalimumab]     Does not remember   . Septra [Sulfamethoxazole-Trimethoprim]   . Cefaclor Rash  . Keflex [Cephalexin] Diarrhea and Rash    REACTION: severe migraine  . Promethazine Hcl Other (See Comments)    IV ROUTE ONLY -  JITTERY FEELING. Patient reports that it is mild and she has used promethazine since then  PO tablet ok  . Sulfadiazine Rash    Metabolic Disorder Labs: Lab Results  Component Value Date   HGBA1C 10.6 (H) 10/04/2018   MPG 280.48 12/25/2017   MPG 246 (H) 06/01/2014   No results found for: PROLACTIN Lab Results  Component Value Date   CHOL 193 03/22/2018   TRIG 90.0 03/22/2018   HDL 62.80 03/22/2018   CHOLHDL 3 03/22/2018   VLDL 18.0 03/22/2018   LDLCALC 113 (H) 03/22/2018   LDLCALC 88 12/19/2015   Lab Results  Component Value Date   TSH 1.43 03/22/2018   TSH 0.773 12/25/2017    Therapeutic Level Labs: No results found for: LITHIUM No results found for: VALPROATE No components found for:  CBMZ  Current Medications: Current Outpatient Medications  Medication Sig Dispense Refill  . ALPRAZolam (XANAX) 0.25 MG tablet Take 0.25 mg by mouth 2 (two) times daily as needed for anxiety.   2  . cyclobenzaprine (FLEXERIL) 5 MG tablet Take 1 tablet (5 mg total) by mouth at bedtime as needed for muscle spasms. (Patient not taking: Reported on 11/24/2018) 30 tablet 0  . doxycycline (VIBRA-TABS) 100 MG tablet Take 1 tablet (100 mg total) by mouth 2 (two) times daily. 14 tablet 0  . folic acid (FOLVITE) 1 MG tablet Take 1 mg by mouth daily.  5  . glucose blood ([employer] TEST STRIPS) test strip 1 strip by Misc.(Non-Drug; Combo Route) route 4 times daily with meals and nightly. contour next glucose test strips    . HUMALOG 100 UNIT/ML injection     . hydrOXYzine (VISTARIL) 25 MG capsule Take 1 capsule (25 mg total) by mouth 3 (three) times daily as needed. 30 capsule 0  . insulin glargine (LANTUS) 100 UNIT/ML injection Inject 60 units [place] as directed 10 mL 5  . Insulin Human (INSULIN PUMP) SOLN Inject into the skin continuous. *Novolog*    .  levonorgestrel (MIRENA) 20 MCG/24HR IUD 1 each by Intrauterine route once. Placed 04/2013    . mupirocin ointment (BACTROBAN) 2 % Apply 1 application  topically daily as needed.  2  . ondansetron (ZOFRAN) 4 MG tablet 1 tab every 4-8 hours for nausea (Patient not taking: Reported on 09/08/2018) 10 tablet 5  . pantoprazole (PROTONIX) 40 MG tablet Take 1 tablet (40 mg total) by mouth daily. 90 tablet 4  . spironolactone (ALDACTONE) 100 MG tablet Take 1 tablet (100 mg total) by mouth daily. 30 tablet 3  . traZODone (DESYREL) 50 MG tablet Take 1 tablet (50 mg total) by mouth at bedtime. 30 tablet 0  . varenicline (CHANTIX PAK) 0.5 MG X 11 & 1 MG X 42 tablet Take one 0.5 mg tablet by mouth once daily for 3 days, then increase to one 0.5 mg tablet twice daily for 4 days, then increase to one 1 mg tablet twice daily. (Patient not taking: Reported on 09/08/2018) 53 tablet 0  . VIIBRYD 40 MG TABS TAKE 1 TABLET BY MOUTH ONCE DAILY 30 tablet 6   No current facility-administered medications for this visit.      Musculoskeletal: Strength & Muscle Tone: within normal limits Gait & Station: normal Patient leans: N/A  Psychiatric Specialty Exam: Review of Systems  Psychiatric/Behavioral: Positive for depression. The patient is nervous/anxious and has insomnia.   All other systems reviewed and are negative.   There were no vitals taken for this visit.There is no height or weight on file to calculate BMI.  General Appearance: Casual  Eye Contact:  Good  Speech:  Clear and Coherent  Volume:  Normal  Mood:  Depressed  Affect:  Congruent  Thought Process:  Coherent  Orientation:  Full (Time, Place, and Person)  Thought Content: WDL   Suicidal Thoughts:  No  Homicidal Thoughts:  No  Memory:  Immediate;   Fair Recent;   Fair Remote;   Fair  Judgement:  Fair  Insight:  Fair  Psychomotor Activity:  Normal  Concentration:  Concentration: Fair  Recall:  [employer] of Knowledge: Fair  Language: Fair  Akathisia:  No  Handed:  Right  AIMS (if indicated):   Assets:  Communication Skills Desire for Improvement Resilience Social Support  ADL's:   Intact  Cognition: WNL  Sleep:  Fair   Screenings: GAD-7     Counselor from 11/24/2018 in [place]  Total GAD-7 Score  19    PHQ2-9     Counselor from 11/24/2018 in [place] Patient Outreach from 03/14/2015 in [place]  PHQ-2 Total Score  6  1  PHQ-9 Total Score  24  -       Assessment and Plan:  Continue partial hospitalization program Initiated Minipress 1 mg p.o. nightly -consider titration Disposition stepdown to intensive outpatient program (Pending)   Treatment plan was reviewed and agreed upon by NP T. Bobby Rumpf and patient [name] need for continued group services   [place], NP 12/07/2018, 11:12 AM

## 2018-12-08 ENCOUNTER — Other Ambulatory Visit (HOSPITAL_COMMUNITY): Payer: Self-pay

## 2018-12-08 NOTE — Psych (Signed)
[facility] BH PHP THERAPIST PROGRESS NOTE  [name] [id number]  Session Time: 9:00 - 11:00   Participation Level: Active  Behavioral Response: CasualAlertDepressed  Type of Therapy: Group Therapy  Treatment Goals addressed: Coping  Interventions: CBT, DBT, Solution Focused, Supportive and Reframing  Summary: Clinician led check-in regarding current stressors and situation, and review of patient completed daily inventory. Clinician utilized active listening and empathetic response and validated patient emotions. Clinician facilitated processing group on pertinent issues.   Therapist Response: [name] is a 38 y.o. female who presents with depression and anxiety symptoms. Patient arrived within time allowed and reports that she "had a rough night and rough morning". Pt. reports she had "two major panic attacks", one last night and one this morning. Patient rates her mood at a 3.5 on a scale of 1-10 with 10 being great. Pt. states she got dinner with family, ran errands and went to church yesterday. Pt states she had difficulty falling asleep last night and took sleep medication. Pt states she had nightmares during the night and woke up in the midst of a panic attack this morning. Pt. states she has a rough time getting her daughter ready for school and had another panic attack while she was driving to group. Pt reports she was able to pull to the side of the road until she was calm again. Pt able to process different ways to manage panic attacks in the future. Patient engaged in discussion.        Session Time: 11:00 -12:00  Participation Level:Active  Behavioral Response:CasualAlertDepressed  Type of Therapy: Group Therapy, OT  Treatment Goals addressed: Coping  Interventions:Psychosocial skills training, Supportive,   Summary:Occupational Therapy group  Therapist Response: Patient engaged in group. See OT note.         Session Time: 12:00 -  12:45  Participation Level: Active  Behavioral Response: CasualAlertDepressed  Type of Therapy: Group Therapy  Treatment Goals addressed: Coping  Interventions: Systems analyst, Supportive  Summary:  Reflection Group: Patients encouraged to practice skills and interpersonal techniques or work on mindfulness and relaxation techniques. The importance of self-care and making skills part of a routine to increase usage were stressed   Therapist Response: Patient engaged and participated appropriately.        Session Time: 12:45- 2:00  Participation Level: Active  Behavioral Response: CasualAlertDepressed  Type of Therapy: Group Therapy, Psychoeducation  Treatment Goals addressed: Coping  Interventions: CBT; Supportive; Reframing  Summary: 12:45 - 1:50: Clinician continued discussion on boundaries. Group participated in boundaries workshop activity in which they helped each other work through boundary issues.    1:50 -2:00 Clinician led check-out. Clinician assessed for immediate needs, medication compliance and efficacy, and safety concerns   Therapist Response: Patient engaged in activity and discussion. Pt reports having boundary issues with family, however struggles to relate to what she can do, focusing instead on what they are doing. Pt accepts group feedback.  At [place], patient rates her mood at a 7 on a scale of 1-10 with 10 being great. Patient reports afternoon plans to go to the nail salon, cook dinner and spend time with family and do laundry. Patient demonstrates some progress as evidenced by identifying 2 new strategies to utilize when she is anxious. Patient denies SI/HI/self-harm thoughts at the end of group.         Suicidal/Homicidal: Nowithout intent/plan   Plan: Pt will continue in PHP while working to decrease depression and anxiety symptoms and increase  ability to manage symptoms in a healthy manner.   Diagnosis: MDD  (major depressive disorder), recurrent severe, without psychosis ([name]) [F33.2]    1. MDD (major depressive disorder), recurrent severe, without psychosis ([name])   2. GAD (generalized anxiety disorder)       Lorin Glass, LCSW 12/08/2018

## 2018-12-08 NOTE — Psych (Signed)
   [facility] BH PHP THERAPIST PROGRESS NOTE  [name] [id number]  Session Time: 9:00 - 11:00   Participation Level: Active  Behavioral Response: CasualAlertDepressed  Type of Therapy: Group Therapy  Treatment Goals addressed: Coping  Interventions: CBT, DBT, Solution Focused, Supportive and Reframing  Summary: Clinician led check-in regarding current stressors and situation, and review of patient completed daily inventory. Clinician utilized active listening and empathetic response and validated patient emotions. Clinician facilitated processing group on pertinent issues.   Therapist Response: [name] is a 38 y.o. female who presents with depression and anxiety symptoms. Patient arrived within time allowed and reports that she is feeling "tired". Patient rates her mood at a 4 on a scale of 1-10 with 10 being great. Pt. reports she "had a rough night and a rough morning." Pt. states she is having a hard time with motivation to get things done. Pt. reports that yesterday she spent time with her fiance and daughter and cooked dinner for the family. Pt. shares she had an argument with her sister and "I cussed her out because she cussed my daughter out first." Pt. states she walked out of her sister's house mid-argument. Pt able to process how to better handle volatile situations.  Patient engaged in discussion.       Session Time: 11:00 -12:15  Participation Level: Active  Behavioral Response: CasualAlertDepressed  Type of Therapy: Group Therapy, psychotherapy  Treatment Goals addressed: Coping  Interventions: Strengths based, reframing, Supportive,   Summary:  Spiritual Care group  Therapist Response: Patient engaged in group. See chaplain note.         Session Time: 12:15 - 1:00  Participation Level: Active  Behavioral Response: CasualAlertDepressed  Type of Therapy: Group Therapy  Treatment Goals addressed: Coping  Interventions:  Systems analyst, Supportive  Summary:  Reflection Group: Patients encouraged to practice skills and interpersonal techniques or work on mindfulness and relaxation techniques. The importance of self-care and making skills part of a routine to increase usage were stressed   Therapist Response: Patient engaged and participated appropriately.        Session Time: 1:00- 2:00  Participation Level: Active  Behavioral Response: CasualAlertDepressed  Type of Therapy: Group Therapy, Psychoeducation  Treatment Goals addressed: Coping  Interventions: relaxation training; Supportive; Reframing  Summary: 12:45 - 1:50: Relaxation group: Cln led group focused on retraining the body's response to stress.   1:50 -2:00 Clinician led check-out. Clinician assessed for immediate needs, medication compliance and efficacy, and safety concerns   Therapist Response: Patient engaged in activity and discussion. At [place], patient rates her mood at a 6 on a scale of 1-10 with 10 being great. Patient reports afternoon plans to cook dinner, clean the kitchen and go to church. Patient demonstrates some progress as evidenced by engaging in first group session. Patient denies SI/HI/self-harm thoughts at the end of group.         Suicidal/Homicidal: Nowithout intent/plan   Plan: Pt will continue in PHP while working to decrease depression and anxiety symptoms and increase ability to manage symptoms in a healthy manner.   Diagnosis: MDD (major depressive disorder), recurrent severe, without psychosis ([place]) [F33.2]    1. MDD (major depressive disorder), recurrent severe, without psychosis ([place])       Lorin Glass, LCSW 12/08/2018

## 2018-12-09 ENCOUNTER — Other Ambulatory Visit (HOSPITAL_COMMUNITY): Payer: 59 | Admitting: Licensed Clinical Social Worker

## 2018-12-09 ENCOUNTER — Other Ambulatory Visit (HOSPITAL_COMMUNITY): Payer: 59 | Admitting: Occupational Therapy

## 2018-12-09 ENCOUNTER — Encounter (HOSPITAL_COMMUNITY): Payer: Self-pay

## 2018-12-09 ENCOUNTER — Other Ambulatory Visit: Payer: Self-pay

## 2018-12-09 VITALS — BP 140/92 | HR 122 | Temp 97.3°F | Resp 18 | Wt 216.8 lb

## 2018-12-09 DIAGNOSIS — R011 Cardiac murmur, unspecified: Secondary | ICD-10-CM | POA: Diagnosis not present

## 2018-12-09 DIAGNOSIS — F4310 Post-traumatic stress disorder, unspecified: Secondary | ICD-10-CM | POA: Diagnosis not present

## 2018-12-09 DIAGNOSIS — J45909 Unspecified asthma, uncomplicated: Secondary | ICD-10-CM | POA: Diagnosis not present

## 2018-12-09 DIAGNOSIS — R4589 Other symptoms and signs involving emotional state: Secondary | ICD-10-CM | POA: Diagnosis not present

## 2018-12-09 DIAGNOSIS — F070 Personality change due to known physiological condition: Secondary | ICD-10-CM

## 2018-12-09 DIAGNOSIS — F411 Generalized anxiety disorder: Secondary | ICD-10-CM | POA: Diagnosis not present

## 2018-12-09 DIAGNOSIS — K219 Gastro-esophageal reflux disease without esophagitis: Secondary | ICD-10-CM | POA: Diagnosis not present

## 2018-12-09 DIAGNOSIS — F332 Major depressive disorder, recurrent severe without psychotic features: Secondary | ICD-10-CM | POA: Diagnosis not present

## 2018-12-09 DIAGNOSIS — R45851 Suicidal ideations: Secondary | ICD-10-CM | POA: Diagnosis not present

## 2018-12-09 DIAGNOSIS — E119 Type 2 diabetes mellitus without complications: Secondary | ICD-10-CM | POA: Diagnosis not present

## 2018-12-09 NOTE — Progress Notes (Signed)
Patient pleasant, smiling, appropriate mood and affect. States the program is going good and that she will be starting IOP next Tuesday. She is still working on coping mechanisms and boundaries to incorporate into her life. Trying different measures for panic attacks that are working. Was having 8-10 panic attacks a day and now has 5-6 daily. She is learning her triggers and able to stop them before they happen.Contines to have anxiety surrounding her job and going back to work. She will be making an appointment to speak with HR soon. Was told she will be fired once she returns. On scale of 1-10 as 10 being worst, rates depression at 6. Anxiety at an 8 and Hopelessness at a 5. Denies SI/HI or psychosis. No issues or complaints with medications.

## 2018-12-10 ENCOUNTER — Other Ambulatory Visit (HOSPITAL_COMMUNITY): Payer: 59 | Admitting: Licensed Clinical Social Worker

## 2018-12-10 ENCOUNTER — Encounter (HOSPITAL_COMMUNITY): Payer: Self-pay | Admitting: Occupational Therapy

## 2018-12-10 ENCOUNTER — Other Ambulatory Visit (HOSPITAL_COMMUNITY): Payer: 59 | Admitting: Occupational Therapy

## 2018-12-10 DIAGNOSIS — R4589 Other symptoms and signs involving emotional state: Secondary | ICD-10-CM

## 2018-12-10 DIAGNOSIS — F070 Personality change due to known physiological condition: Secondary | ICD-10-CM

## 2018-12-10 DIAGNOSIS — F4310 Post-traumatic stress disorder, unspecified: Secondary | ICD-10-CM | POA: Diagnosis not present

## 2018-12-10 DIAGNOSIS — K219 Gastro-esophageal reflux disease without esophagitis: Secondary | ICD-10-CM | POA: Diagnosis not present

## 2018-12-10 DIAGNOSIS — E119 Type 2 diabetes mellitus without complications: Secondary | ICD-10-CM | POA: Diagnosis not present

## 2018-12-10 DIAGNOSIS — R011 Cardiac murmur, unspecified: Secondary | ICD-10-CM | POA: Diagnosis not present

## 2018-12-10 DIAGNOSIS — R45851 Suicidal ideations: Secondary | ICD-10-CM | POA: Diagnosis not present

## 2018-12-10 DIAGNOSIS — F411 Generalized anxiety disorder: Secondary | ICD-10-CM | POA: Diagnosis not present

## 2018-12-10 DIAGNOSIS — J45909 Unspecified asthma, uncomplicated: Secondary | ICD-10-CM | POA: Diagnosis not present

## 2018-12-10 DIAGNOSIS — F332 Major depressive disorder, recurrent severe without psychotic features: Secondary | ICD-10-CM

## 2018-12-10 NOTE — Therapy (Signed)
[place], [place], [address] Phone: [phone]   Fax:  [phone]  Occupational Therapy Treatment  Patient Details  Name: [name] MRN: [id number] Date of Birth: 1981-06-26 Referring Provider (OT): [name], NP   Encounter Date: 12/09/2018  OT End of Session - 12/10/18 1009    Visit Number  7    Number of Visits  16    Date for OT Re-Evaluation  12/23/18    Authorization Type  [place] [employer]    OT Start Time  1100    OT Stop Time  1200    OT Time Calculation (min)  60 min    Activity Tolerance  Patient tolerated treatment well    Behavior During Therapy  [facility] for tasks assessed/performed       Past Medical History:  Diagnosis Date  . Abscess of left axilla - PREVOTELLA BIVIA & Staph Coag Neg 07/12/2012   MODERATE PREVOTELLA BIVIA Note: BETA LACTAMASE NEGATIVE    . Asthma    as a child  . Cancer ([place])    skin tag rt breast  . Cellulitis 06/01/2014   RT INNER THIGH  . Depression    hx  . Diabetes ([place])   . Diabetes mellitus    IDDM, Insulin pump; followed by Dr. [name]  . GERD (gastroesophageal reflux disease)    no current meds.  . Heart murmur   . Hidradenitis 04/2012   bilat. thighs, left groin - open areas on thighs  . Hx MRSA infection   . Hydradenitis   . PCOS (polycystic ovarian syndrome)   . Pneumonia    hx  . SVT (supraventricular tachycardia) ([place]) 06/2017  . Vitamin D insufficiency     Past Surgical History:  Procedure Laterality Date  . BREAST SURGERY     right lumpectomy  . BUNIONECTOMY     left  . CARDIAC CATHETERIZATION    . CHOLECYSTECTOMY  12/02/2006   lap. chole.  Marland Kitchen DILATION AND EVACUATION  02/14/2007  . ESOPHAGOGASTRODUODENOSCOPY  11/17/2011   Procedure: ESOPHAGOGASTRODUODENOSCOPY (EGD);  Surgeon: [name], MD;  Location: [facility];  Service: Endoscopy;  Laterality: N/A;  . EYE SURGERY     exc. stye left eye  .  HYDRADENITIS EXCISION  04/30/2012   Procedure: EXCISION HYDRADENITIS GROIN;  Surgeon: [name], MD;  Location: [place];  Service: General;  Laterality: Left;  wide excision hidradenitis bilateral thighs and Left groin  . HYDRADENITIS EXCISION Left 01/13/2014   Procedure: WIDE EXCISION HIDRADENITIS LEFT AXILLA;  Surgeon: [name], MD;  Location: [place];  Service: General;  Laterality: Left;  . IRRIGATION AND DEBRIDEMENT ABSCESS Right 06/02/2014   Procedure: IRRIGATION AND DEBRIDEMENT ABSCESS;  Surgeon: [name], MD;  Location: [place];  Service: General;  Laterality: Right;  . IRRIGATION AND DEBRIDEMENT ABSCESS Left 09/23/2014   Procedure: IRRIGATION AND DEBRIDEMENT ABSCESS/LEFT THIGH;  Surgeon: [name], MD;  Location: [place];  Service: General;  Laterality: Left;  . LEFT HEART CATHETERIZATION WITH CORONARY ANGIOGRAM N/A 07/14/2014   Procedure: LEFT HEART CATHETERIZATION WITH CORONARY ANGIOGRAM;  Surgeon: [name], MD;  Location: [facility] CATH LAB;  Service: Cardiovascular;  Laterality: N/A;  . TONSILLECTOMY    . WISDOM TOOTH EXTRACTION      There were no vitals filed for this visit.  Subjective Assessment - 12/10/18 1007    Currently in Pain?  Other (Comment)   chronic pain  S: "I love doing art work"  O: OT session focused on using art therapy as a means of expression and display of functional goals set in previous session. Music played during session to further facilitate relaxation response. Pt encouraged to share project at end of session and explain how they implemented their functional BADL/IADL goals into the project.  A: Pt presents to group with blunted affect, very engaged in activity and voicing that she enjoys creative activities. Pt chose to use magazines to fabricate a collage with a variety of words and pictures to display her goals. Pt shares successes she has noticed within herself and her daughter as a result of her hard  work in [place], improved affect noted by end of session.  P: OT group will be x4 per week while pt in PHP                  OT Education - 12/10/18 1007    Education Details  education given on using art therapy as a means to express goals    Person(s) Educated  Patient    Methods  Explanation;Handout    Comprehension  Verbalized understanding       OT Short Term Goals - 11/26/18 1306      OT SHORT TERM GOAL #1   Title  Pt will be educated on strategies to improve psychosocial skills needed to participate fully in all daily, work, and leisure activities    Time  4    Period  Weeks    Status  On-going    Target Date  12/23/18      OT SHORT TERM GOAL #2   Title  Pt will apply psychosocial skills and coping mechanisms to daily activities in order to function independently and reintegrate into community    Time  4    Period  Weeks    Status  On-going    Target Date  12/23/18      OT SHORT TERM GOAL #3   Title  Pt will recall and apply 1-3 sleep hygiene strategies to improve BADL function    Time  4    Period  Weeks    Status  On-going    Target Date  12/23/18      OT SHORT TERM GOAL #4   Title  Pt will choose and/or engage in 1-3 socially engaging leisure activities to improve social participation upon reintegrating into community    Time  4    Period  Weeks    Target Date  12/23/18      OT SHORT TERM GOAL #5   Title  Pt will engage in goal setting to improve functional BADL/IADL routine upon reintegrating into community    Time  4    Period  Weeks    Status  On-going    Target Date  12/23/18               Plan - 12/10/18 1009    Occupational performance deficits (Please refer to evaluation for details):  ADL's;IADL's;Rest and Sleep;Work;Leisure;Social Participation    Body Structure / Function / Physical Skills  ADL    Cognitive Skills  Energy/Drive    Psychosocial Skills  Coping Strategies;Interpersonal Interaction;Routines and Behaviors;Habits        Patient will benefit from skilled therapeutic intervention in order to improve the following deficits and impairments:  Body Structure / Function / Physical Skills, Cognitive Skills, Psychosocial Skills  Visit Diagnosis: Organic personality disorder  Difficulty coping  Problem List Patient Active Problem List   Diagnosis Date Noted  . Enteritis 12/24/2017  . Intractable nausea and vomiting 12/24/2017  . Epigastric pain   . Other chest pain 09/28/2017  . TIA (transient ischemic attack) 09/28/2017  . Paroxysmal SVT (supraventricular tachycardia) ([place]) 06/24/2017  . Dizziness 06/24/2017  . Weakness 06/24/2017  . Hyperglycemia 03/17/2016  . Abscess 03/17/2016  . Chest discomfort 07/10/2014  . Cellulitis 06/01/2014  . Cellulitis of right thigh 06/01/2014  . Postop check 01/19/2014  . Smoking addiction 08/29/2013  . DKA, type 1 ([place]) 05/09/2013  . Hidradenitis suppurativa 04/21/2012  . Esophageal reflux 11/17/2011  . Seasonal and perennial allergic rhinitis 05/20/2010  . BRONCHITIS, ACUTE 11/29/2007  . HYPONATREMIA 11/14/2007  . ANXIETY STATE, UNSPECIFIED 10/24/2007  . Sinus tachycardia 07/07/2007  . Diabetes mellitus type I ([place]) 04/08/2007  . Smoker 04/08/2007  . Depression 04/08/2007  . 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 MIGRAINES, HX OF 04/08/2007   Zenovia Jarred, MSOT, OTR/L Behavioral Health OT/ Acute Relief OT PHP Office: [phone]  Zenovia Jarred 12/10/2018, 10:10 AM  [facility] HOSPITALIZATION PROGRAM [place], [place], [address] Phone: [phone]   Fax:  [phone]  Name: [name] MRN: [id number] Date of Birth: [birth year]

## 2018-12-10 NOTE — Therapy (Signed)
[place], [place], [address] Phone: [phone]   Fax:  [phone]  Occupational Therapy Treatment  Patient Details  Name: [name] MRN: [id number] Date of Birth: [birth year] Referring Provider (OT): [name], NP   Encounter Date: 12/10/2018  OT End of Session - 12/10/18 1302    Visit Number  8    Number of Visits  16    Date for OT Re-Evaluation  12/23/18    Authorization Type  [place] [employer]    OT Start Time  1100    OT Stop Time  1200    OT Time Calculation (min)  60 min    Activity Tolerance  Patient tolerated treatment well    Behavior During Therapy  [facility] for tasks assessed/performed       Past Medical History:  Diagnosis Date  . Abscess of left axilla - PREVOTELLA BIVIA & Staph Coag Neg 07/12/2012   MODERATE PREVOTELLA BIVIA Note: BETA LACTAMASE NEGATIVE    . Asthma    as a child  . Cancer ([place])    skin tag rt breast  . Cellulitis 06/01/2014   RT INNER THIGH  . Depression    hx  . Diabetes ([place])   . Diabetes mellitus    IDDM, Insulin pump; followed by Dr. [name]  . GERD (gastroesophageal reflux disease)    no current meds.  . Heart murmur   . Hidradenitis 04/2012   bilat. thighs, left groin - open areas on thighs  . Hx MRSA infection   . Hydradenitis   . PCOS (polycystic ovarian syndrome)   . Pneumonia    hx  . SVT (supraventricular tachycardia) ([place]) 06/2017  . Vitamin D insufficiency     Past Surgical History:  Procedure Laterality Date  . BREAST SURGERY     right lumpectomy  . BUNIONECTOMY     left  . CARDIAC CATHETERIZATION    . CHOLECYSTECTOMY  12/02/2006   lap. chole.  Marland Kitchen DILATION AND EVACUATION  02/14/2007  . ESOPHAGOGASTRODUODENOSCOPY  11/17/2011   Procedure: ESOPHAGOGASTRODUODENOSCOPY (EGD);  Surgeon: [name], MD;  Location[facility];  Service: Endoscopy;  Laterality: N/A;  . EYE SURGERY     exc. stye left eye  .  HYDRADENITIS EXCISION  04/30/2012   Procedure: EXCISION HYDRADENITIS GROIN;  Surgeon: [name], MD;  Location: [place];  Service: General;  Laterality: Left;  wide excision hidradenitis bilateral thighs and Left groin  . HYDRADENITIS EXCISION Left 01/13/2014   Procedure: WIDE EXCISION HIDRADENITIS LEFT AXILLA;  Surgeon: [name], MD;  Location: [place];  Service: General;  Laterality: Left;  . IRRIGATION AND DEBRIDEMENT ABSCESS Right 06/02/2014   Procedure: IRRIGATION AND DEBRIDEMENT ABSCESS;  Surgeon: [name], MD;  Location: [place];  Service: General;  Laterality: Right;  . IRRIGATION AND DEBRIDEMENT ABSCESS Left 09/23/2014   Procedure: IRRIGATION AND DEBRIDEMENT ABSCESS/LEFT THIGH;  Surgeon: [name], MD;  Location: [facility];  Service: General;  Laterality: Left;  . LEFT HEART CATHETERIZATION WITH CORONARY ANGIOGRAM N/A 07/14/2014   Procedure: LEFT HEART CATHETERIZATION WITH CORONARY ANGIOGRAM;  Surgeon: [name], MD;  Location: [facility] CATH LAB;  Service: Cardiovascular;  Laterality: N/A;  . TONSILLECTOMY    . WISDOM TOOTH EXTRACTION      There were no vitals filed for this visit.  Subjective Assessment - 12/10/18 1301    Currently in Pain?  Other (Comment)   chronic pain  S: "I am in a really good mood today"  O: OT tx with focus on self esteem building this date. Education given on definition of self esteem, with both causes of low and high self esteem identified. Activity given for pt to identify a positive/aspiring trait for each letter of the alphabet. Pt to work with peers to help complete activity and build positive thinking.   A: Pt presents to group with brighter affect than previous dates. She is very well groomed and dressed well, with increase in smile and laughter. She states she feels much better this date and is enjoying her good mood. Pt contributed to negative vs positive discussion stating that stigma decreases her self  esteem, while financial planning increases her self esteem. Pt completed A-Z activities with 1 verbal cue.  P: OT group will be 4x per week while pt in [place]                  OT Education - 12/10/18 1302    Education Details  education given on self esteem    Person(s) Educated  Patient    Methods  Explanation;Handout    Comprehension  Verbalized understanding       OT Short Term Goals - 11/26/18 1306      OT SHORT TERM GOAL #1   Title  Pt will be educated on strategies to improve psychosocial skills needed to participate fully in all daily, work, and leisure activities    Time  4    Period  Weeks    Status  On-going    Target Date  12/23/18      OT SHORT TERM GOAL #2   Title  Pt will apply psychosocial skills and coping mechanisms to daily activities in order to function independently and reintegrate into community    Time  4    Period  Weeks    Status  On-going    Target Date  12/23/18      OT SHORT TERM GOAL #3   Title  Pt will recall and apply 1-3 sleep hygiene strategies to improve BADL function    Time  4    Period  Weeks    Status  On-going    Target Date  12/23/18      OT SHORT TERM GOAL #4   Title  Pt will choose and/or engage in 1-3 socially engaging leisure activities to improve social participation upon reintegrating into community    Time  4    Period  Weeks    Target Date  12/23/18      OT SHORT TERM GOAL #5   Title  Pt will engage in goal setting to improve functional BADL/IADL routine upon reintegrating into community    Time  4    Period  Weeks    Status  On-going    Target Date  12/23/18               Plan - 12/10/18 1302    Occupational performance deficits (Please refer to evaluation for details):  ADL's;IADL's;Rest and Sleep;Work;Leisure;Social Participation    Body Structure / Function / Physical Skills  ADL    Cognitive Skills  Energy/Drive    Psychosocial Skills  Coping Strategies;Interpersonal Interaction;Routines  and Behaviors;Habits       Patient will benefit from skilled therapeutic intervention in order to improve the following deficits and impairments:  Body Structure / Function / Physical Skills, Cognitive Skills, Psychosocial Skills  Visit Diagnosis: Organic personality disorder  Difficulty coping    Problem List Patient Active Problem List   Diagnosis Date Noted  . Enteritis 12/24/2017  . Intractable nausea and vomiting 12/24/2017  . Epigastric pain   . Other chest pain 09/28/2017  . TIA (transient ischemic attack) 09/28/2017  . Paroxysmal SVT (supraventricular tachycardia) ([place]) 06/24/2017  . Dizziness 06/24/2017  . Weakness 06/24/2017  . Hyperglycemia 03/17/2016  . Abscess 03/17/2016  . Chest discomfort 07/10/2014  . Cellulitis 06/01/2014  . Cellulitis of right thigh 06/01/2014  . Postop check 01/19/2014  . Smoking addiction 08/29/2013  . DKA, type 1 ([place]) 05/09/2013  . Hidradenitis suppurativa 04/21/2012  . Esophageal reflux 11/17/2011  . Seasonal and perennial allergic rhinitis 05/20/2010  . BRONCHITIS, ACUTE 11/29/2007  . HYPONATREMIA 11/14/2007  . ANXIETY STATE, UNSPECIFIED 10/24/2007  . Sinus tachycardia 07/07/2007  . Diabetes mellitus type I ([place]) 04/08/2007  . Smoker 04/08/2007  . Depression 04/08/2007  . 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 MIGRAINES, HX OF 04/08/2007   Zenovia Jarred, MSOT, OTR/L Behavioral Health OT/ Acute Relief OT PHP Office: [phone]  Zenovia Jarred 12/10/2018, 1:03 PM  [facility] HOSPITALIZATION PROGRAM [place], [place], [address] Phone: [phone]   Fax:  [phone]  Name: DAVISHA LINTHICUM MRN: [id number] Date of Birth: [birth year]

## 2018-12-13 ENCOUNTER — Other Ambulatory Visit (HOSPITAL_COMMUNITY): Payer: Self-pay

## 2018-12-13 ENCOUNTER — Ambulatory Visit (HOSPITAL_COMMUNITY): Payer: Self-pay

## 2018-12-14 ENCOUNTER — Other Ambulatory Visit (HOSPITAL_COMMUNITY): Payer: 59 | Admitting: Licensed Clinical Social Worker

## 2018-12-14 ENCOUNTER — Other Ambulatory Visit: Payer: Self-pay

## 2018-12-14 ENCOUNTER — Encounter (HOSPITAL_COMMUNITY): Payer: Self-pay | Admitting: Occupational Therapy

## 2018-12-14 ENCOUNTER — Other Ambulatory Visit (HOSPITAL_COMMUNITY): Payer: 59 | Admitting: Occupational Therapy

## 2018-12-14 ENCOUNTER — Encounter (HOSPITAL_COMMUNITY): Payer: Self-pay | Admitting: Family

## 2018-12-14 VITALS — BP 118/77 | HR 101 | Temp 97.9°F | Ht 64.5 in | Wt 219.0 lb

## 2018-12-14 DIAGNOSIS — R4589 Other symptoms and signs involving emotional state: Secondary | ICD-10-CM | POA: Diagnosis not present

## 2018-12-14 DIAGNOSIS — R45851 Suicidal ideations: Secondary | ICD-10-CM | POA: Diagnosis not present

## 2018-12-14 DIAGNOSIS — K219 Gastro-esophageal reflux disease without esophagitis: Secondary | ICD-10-CM | POA: Diagnosis not present

## 2018-12-14 DIAGNOSIS — E119 Type 2 diabetes mellitus without complications: Secondary | ICD-10-CM | POA: Diagnosis not present

## 2018-12-14 DIAGNOSIS — R011 Cardiac murmur, unspecified: Secondary | ICD-10-CM | POA: Diagnosis not present

## 2018-12-14 DIAGNOSIS — J45909 Unspecified asthma, uncomplicated: Secondary | ICD-10-CM | POA: Diagnosis not present

## 2018-12-14 DIAGNOSIS — F4310 Post-traumatic stress disorder, unspecified: Secondary | ICD-10-CM | POA: Diagnosis not present

## 2018-12-14 DIAGNOSIS — F411 Generalized anxiety disorder: Secondary | ICD-10-CM | POA: Diagnosis not present

## 2018-12-14 DIAGNOSIS — F332 Major depressive disorder, recurrent severe without psychotic features: Secondary | ICD-10-CM | POA: Diagnosis not present

## 2018-12-14 DIAGNOSIS — F070 Personality change due to known physiological condition: Secondary | ICD-10-CM

## 2018-12-14 NOTE — Therapy (Signed)
[place], [place], [address] Phone: [phone]   Fax:  [phone]  Occupational Therapy Treatment  Patient Details  Name: [name] MRN: [id number] Date of Birth: 13-Oct-1980 Referring Provider (OT): [name], NP   Encounter Date: 12/14/2018  OT End of Session - 12/14/18 1300    Visit Number  9    Number of Visits  16    Date for OT Re-Evaluation  12/23/18    Authorization Type  [place] [employer]    OT Start Time  1100    OT Stop Time  1200    OT Time Calculation (min)  60 min    Activity Tolerance  Patient tolerated treatment well    Behavior During Therapy  [facility] for tasks assessed/performed       Past Medical History:  Diagnosis Date  . Abscess of left axilla - PREVOTELLA BIVIA & Staph Coag Neg 07/12/2012   MODERATE PREVOTELLA BIVIA Note: BETA LACTAMASE NEGATIVE    . Asthma    as a child  . Cancer ([place])    skin tag rt breast  . Cellulitis 06/01/2014   RT INNER THIGH  . Depression    hx  . Diabetes ([place])   . Diabetes mellitus    IDDM, Insulin pump; followed by Dr. [name]  . GERD (gastroesophageal reflux disease)    no current meds.  . Heart murmur   . Hidradenitis 04/2012   bilat. thighs, left groin - open areas on thighs  . Hx MRSA infection   . Hydradenitis   . PCOS (polycystic ovarian syndrome)   . Pneumonia    hx  . SVT (supraventricular tachycardia) ([place]) 06/2017  . Vitamin D insufficiency     Past Surgical History:  Procedure Laterality Date  . BREAST SURGERY     right lumpectomy  . BUNIONECTOMY     left  . CARDIAC CATHETERIZATION    . CHOLECYSTECTOMY  12/02/2006   lap. chole.  Marland Kitchen DILATION AND EVACUATION  02/14/2007  . ESOPHAGOGASTRODUODENOSCOPY  11/17/2011   Procedure: ESOPHAGOGASTRODUODENOSCOPY (EGD);  Surgeon: [name], MD;  Location: [facility];  Service: Endoscopy;  Laterality: N/A;  . EYE SURGERY     exc. stye left eye  .  HYDRADENITIS EXCISION  04/30/2012   Procedure: EXCISION HYDRADENITIS GROIN;  Surgeon: [name], MD;  Location: [place];  Service: General;  Laterality: Left;  wide excision hidradenitis bilateral thighs and Left groin  . HYDRADENITIS EXCISION Left 01/13/2014   Procedure: WIDE EXCISION HIDRADENITIS LEFT AXILLA;  Surgeon: [name], MD;  Location: [place];  Service: General;  Laterality: Left;  . IRRIGATION AND DEBRIDEMENT ABSCESS Right 06/02/2014   Procedure: IRRIGATION AND DEBRIDEMENT ABSCESS;  Surgeon: [name], MD;  Location: [place];  Service: General;  Laterality: Right;  . IRRIGATION AND DEBRIDEMENT ABSCESS Left 09/23/2014   Procedure: IRRIGATION AND DEBRIDEMENT ABSCESS/LEFT THIGH;  Surgeon: [name], MD;  Location: [place];  Service: General;  Laterality: Left;  . LEFT HEART CATHETERIZATION WITH CORONARY ANGIOGRAM N/A 07/14/2014   Procedure: LEFT HEART CATHETERIZATION WITH CORONARY ANGIOGRAM;  Surgeon: [name], MD;  Location: [facility] C[facility];  Service: Cardiovascular;  Laterality: N/A;  . TONSILLECTOMY    . WISDOM TOOTH EXTRACTION      There were no vitals filed for this visit.  Subjective Assessment - 12/14/18 1300    Currently in Pain?  Other (Comment)   chronic pain  S: "I would really like to improve my emotional self care"   O: Education given on self care and its importance in regular BADL/IADL routine. Pt completed self care assessment to identify areas of strength and weakness. Self care assessments covered areas of physical health, psychological health, spiritual health, and professional health. Pt asked to identifies area of weakness within each area and develop plans for improvement this date. Pt encouraged to brainstorm with other peers to begin goal setting in areas of desired change.  ?  A: Pt presents to group with blunted affect, engaged and participatory throughout entirety of session. Pt shares that her mood is lower this  date, for she has difficulty managing her emotions the past few dates. She shares that she would like to improve her emotional self care and her professional self care. She shares how work has become such a stressor and how she would like to manage it better in the future. Pt also shares she is consistently working on how to express her emotions in a healthier way, rather than acting out. She gives an example of how she started to throw items, and dissects how she could have handled this situation differently for the future.  P: Pt educated on importance of self care in BADL/IADL routine. OT will continue to follow up with pt each treatment session to ensure carryover into daily routine to facilitate successful community integration. OT treatment will be 4 times a week                    OT Education - 12/14/18 1300    Education Details  education given on self care implementation    Person(s) Educated  Patient    Methods  Explanation;Handout    Comprehension  Verbalized understanding       OT Short Term Goals - 12/14/18 1301      OT SHORT TERM GOAL #1   Title  Pt will be educated on strategies to improve psychosocial skills needed to participate fully in all daily, work, and leisure activities    Time  4    Period  Weeks    Status  Achieved    Target Date  12/23/18      OT SHORT TERM GOAL #2   Title  Pt will apply psychosocial skills and coping mechanisms to daily activities in order to function independently and reintegrate into community    Time  4    Period  Weeks    Status  Achieved    Target Date  12/23/18      OT SHORT TERM GOAL #3   Title  Pt will recall and apply 1-3 sleep hygiene strategies to improve BADL function    Time  4    Period  Weeks    Status  Achieved    Target Date  12/23/18      OT SHORT TERM GOAL #4   Title  Pt will choose and/or engage in 1-3 socially engaging leisure activities to improve social participation upon reintegrating into  community    Time  4    Period  Weeks    Status  Achieved    Target Date  12/23/18      OT SHORT TERM GOAL #5   Title  Pt will engage in goal setting to improve functional BADL/IADL routine upon reintegrating into community    Time  4    Period  Weeks    Status  Achieved    Target  Date  12/23/18               Plan - 12/14/18 1300    Occupational performance deficits (Please refer to evaluation for details):  ADL's;IADL's;Rest and Sleep;Work;Leisure;Social Participation    Body Structure / Function / Physical Skills  ADL    Cognitive Skills  Energy/Drive    Psychosocial Skills  Coping Strategies;Interpersonal Interaction;Routines and Behaviors;Habits       Patient will benefit from skilled therapeutic intervention in order to improve the following deficits and impairments:  Body Structure / Function / Physical Skills, Cognitive Skills, Psychosocial Skills  Visit Diagnosis: Organic personality disorder  Difficulty coping    Problem List Patient Active Problem List   Diagnosis Date Noted  . Enteritis 12/24/2017  . Intractable nausea and vomiting 12/24/2017  . Epigastric pain   . Other chest pain 09/28/2017  . TIA (transient ischemic attack) 09/28/2017  . Paroxysmal SVT (supraventricular tachycardia) ([place]) 06/24/2017  . Dizziness 06/24/2017  . Weakness 06/24/2017  . Hyperglycemia 03/17/2016  . Abscess 03/17/2016  . Chest discomfort 07/10/2014  . Cellulitis 06/01/2014  . Cellulitis of right thigh 06/01/2014  . Postop check 01/19/2014  . Smoking addiction 08/29/2013  . DKA, type 1 ([place]) 05/09/2013  . Hidradenitis suppurativa 04/21/2012  . Esophageal reflux 11/17/2011  . Seasonal and perennial allergic rhinitis 05/20/2010  . BRONCHITIS, ACUTE 11/29/2007  . HYPONATREMIA 11/14/2007  . ANXIETY STATE, UNSPECIFIED 10/24/2007  . Sinus tachycardia 07/07/2007  . Diabetes mellitus type I ([place]) 04/08/2007  . Smoker 04/08/2007  . Depression 04/08/2007  .  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 MIGRAINES, HX OF 04/08/2007    OCCUPATIONAL THERAPY DISCHARGE SUMMARY  Visits from Start of Care: 9  Current functional level related to goals / functional outcomes: Pt is stepping down to IOP level of care for continued maintenance of MH coping skills   Remaining deficits: Continuing to implement skills learned in [facility]   Education / Equipment: Education given on psychosocial skills and coping mechanisms as it applies to increasing function in BADL/IADL routine  Plan: Patient agrees to discharge.  Patient goals were met. Patient is being discharged due to meeting the stated rehab goals.  ?????       Zenovia Jarred, MSOT, OTR/L Behavioral Health OT/ Acute Relief OT PHP Office: [place] 12/14/2018, 1:03 PM  [facility] HOSPITALIZATION PROGRAM [place], [place], [address] Phone: [phone]   Fax:  [phone]  Name: [name] MRN: [id number] Date of Birth: [birth year]

## 2018-12-14 NOTE — Progress Notes (Signed)
Patient presented with flat affect, depressed mood and stated she was feeling much better and had a great weekend but then Sunday started feeling more depressed and did not sleep but 3-4 hours the previous night.  Patient denied any suicidal or homicidal ideations, no auditory or visual hallucinations and no plan or intent to want to harm self or others.  Patient admitted some racing thoughts at night and then is waking up still with bad dreams.  Reviewed all patient's current medications and how she is taking them.  Patient denied any side effects to medications and discussed how to best take night time medications to see if could help more with sleep.  Patient rated her depression an 8, anxiety a 9, and hopelessness a 5 on a scale of 0-10 with 0 being none and hopeful and 10 the worst she could manage and not hopeful.  Patient stated "I still have hope" and reported she is enjoying more time with her fiancee.  States he is a good support and helps her when her mood is down.  Patient denied any side effects to current medications and scored a 19 on her PHQ9 depression screening, down from 24 from 11/24/18.  Reports plan to transition to IOP this week and will keep nurse practitioner informed if any worsening or symptoms or continues to have problems with sleep.  Patient reported she is using her skills to help work through current depressive symptoms and again denied any danger to self or others and agreed to inform staff if thoughts such as these began to occur.

## 2018-12-14 NOTE — Progress Notes (Signed)
  [facility] Partial Hospitil[facility] Outpatient Program Discharge Summary  KAWANA HEGEL [id number]  Admission date: 11/24/2018 Discharge date: 03/34/2020  Reason for admission: [name] is a 38 y.o. Caucasian female presents worsening depression.  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  [place].  reports her symptoms started roughly about 3 weeks ago.  States she she became stressed and overwhelmed because she is responsible for the actions of other employees.  [place] reports a fear that she will be fired due to 1 of her coworker money drawer was out of balance. "they will blam me because I am the lead."  States she is has been approved for FMLA however is unable to find documents to prove her absences on a few occasions.  Currently denying suicidal or homicidal ideations.  States she is being followed by psychiatry in the past when she was prescribed Viibryd 40 mg states she is currently taking and tolerating medications well.  Reports using marijuana intermittently to help with her anxiety and depression.  However states worsened her symptoms.  Patient was enrolled in partial psychiatric program on 11/24/18.  Chemical Use History: Denied  Family of Origin Issues:  Reported that her current boyfriend has been supportive. Reported a new puppy that was added to her family.    Progress in Program Toward Treatment Goals: Ongoing, patient has attended and participated with daily group sessions.   Progress (rationale): [name] is stepping down to IOP Intensive outpatient program  Take all medications as prescribed. Keep all follow-up appointments as scheduled.  Do not consume alcohol or use illegal drugs while on prescription medications. Report any adverse effects from your medications to your primary care provider promptly.  In the event of recurrent symptoms or worsening symptoms, call 911, a crisis hotline, or go  to the nearest emergency department for evaluation.   Derrill [place], NP 12/14/2018

## 2018-12-15 ENCOUNTER — Other Ambulatory Visit: Payer: Self-pay

## 2018-12-15 ENCOUNTER — Encounter (HOSPITAL_COMMUNITY): Payer: Self-pay | Admitting: Family

## 2018-12-15 ENCOUNTER — Encounter (HOSPITAL_COMMUNITY): Payer: Self-pay | Admitting: Psychiatry

## 2018-12-15 ENCOUNTER — Other Ambulatory Visit (HOSPITAL_COMMUNITY): Payer: Self-pay

## 2018-12-15 ENCOUNTER — Other Ambulatory Visit (HOSPITAL_COMMUNITY): Payer: 59 | Admitting: Psychiatry
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
## 2018-12-16 ENCOUNTER — Other Ambulatory Visit (HOSPITAL_COMMUNITY): Payer: Self-pay

## 2018-12-16 ENCOUNTER — Other Ambulatory Visit (HOSPITAL_COMMUNITY): Payer: 59

## 2018-12-16 ENCOUNTER — Other Ambulatory Visit (HOSPITAL_COMMUNITY): Payer: 59 | Admitting: Licensed Clinical Social Worker

## 2018-12-16 ENCOUNTER — Other Ambulatory Visit: Payer: Self-pay

## 2018-12-16 DIAGNOSIS — F411 Generalized anxiety disorder: Secondary | ICD-10-CM | POA: Diagnosis not present

## 2018-12-16 DIAGNOSIS — F4310 Post-traumatic stress disorder, unspecified: Secondary | ICD-10-CM | POA: Diagnosis not present

## 2018-12-16 DIAGNOSIS — J45909 Unspecified asthma, uncomplicated: Secondary | ICD-10-CM | POA: Diagnosis not present

## 2018-12-16 DIAGNOSIS — E119 Type 2 diabetes mellitus without complications: Secondary | ICD-10-CM | POA: Diagnosis not present

## 2018-12-16 DIAGNOSIS — R45851 Suicidal ideations: Secondary | ICD-10-CM | POA: Diagnosis not present

## 2018-12-16 DIAGNOSIS — R4589 Other symptoms and signs involving emotional state: Secondary | ICD-10-CM | POA: Diagnosis not present

## 2018-12-16 DIAGNOSIS — K219 Gastro-esophageal reflux disease without esophagitis: Secondary | ICD-10-CM | POA: Diagnosis not present

## 2018-12-16 DIAGNOSIS — F332 Major depressive disorder, recurrent severe without psychotic features: Secondary | ICD-10-CM

## 2018-12-16 DIAGNOSIS — R011 Cardiac murmur, unspecified: Secondary | ICD-10-CM | POA: Diagnosis not present

## 2018-12-17 ENCOUNTER — Other Ambulatory Visit (HOSPITAL_COMMUNITY): Payer: Self-pay

## 2018-12-17 ENCOUNTER — Other Ambulatory Visit: Payer: Self-pay

## 2018-12-17 ENCOUNTER — Ambulatory Visit (HOSPITAL_COMMUNITY): Payer: Self-pay

## 2018-12-17 ENCOUNTER — Telehealth: Payer: Self-pay | Admitting: Internal Medicine

## 2018-12-17 ENCOUNTER — Other Ambulatory Visit (HOSPITAL_COMMUNITY): Payer: 59 | Admitting: Licensed Clinical Social Worker

## 2018-12-17 DIAGNOSIS — F4310 Post-traumatic stress disorder, unspecified: Secondary | ICD-10-CM | POA: Diagnosis not present

## 2018-12-17 DIAGNOSIS — R45851 Suicidal ideations: Secondary | ICD-10-CM | POA: Diagnosis not present

## 2018-12-17 DIAGNOSIS — F332 Major depressive disorder, recurrent severe without psychotic features: Secondary | ICD-10-CM | POA: Diagnosis not present

## 2018-12-17 DIAGNOSIS — R4589 Other symptoms and signs involving emotional state: Secondary | ICD-10-CM | POA: Diagnosis not present

## 2018-12-17 DIAGNOSIS — K219 Gastro-esophageal reflux disease without esophagitis: Secondary | ICD-10-CM | POA: Diagnosis not present

## 2018-12-17 DIAGNOSIS — F411 Generalized anxiety disorder: Secondary | ICD-10-CM | POA: Diagnosis not present

## 2018-12-17 DIAGNOSIS — J45909 Unspecified asthma, uncomplicated: Secondary | ICD-10-CM | POA: Diagnosis not present

## 2018-12-17 DIAGNOSIS — E119 Type 2 diabetes mellitus without complications: Secondary | ICD-10-CM | POA: Diagnosis not present

## 2018-12-17 DIAGNOSIS — R011 Cardiac murmur, unspecified: Secondary | ICD-10-CM | POA: Diagnosis not present

## 2018-12-17 MED ORDER — FLUCONAZOLE 150 MG PO TABS: 150.0000 mg | ORAL_TABLET | Freq: Every day | ORAL | 3 refills | Status: DC

## 2018-12-17 NOTE — Psych (Signed)
   [facility] PHP THERAPIST PROGRESS NOTE  [name] [id number]  Session Time: 9:00 - 11:00   Participation Level: Active  Behavioral Response: CasualAlertDepressed  Type of Therapy: Group Therapy  Treatment Goals addressed: Coping  Interventions: CBT, DBT, Solution Focused, Supportive and Reframing  Summary: Clinician led check-in regarding current stressors and situation, and review of patient completed daily inventory. Clinician utilized active listening and empathetic response and validated patient emotions. Clinician facilitated processing group on pertinent issues.   Therapist Response: [name] is a 38 y.o. female who presents with depression and anxiety symptoms. Patient arrived within time allowed and reports that she is feeling good. Patient rates her mood at an 8 on a scale of 1-10 with 10 being great. Pt reports she had no nightmares and "I woke up in a better mood".  Pt states she did laundry, cleaned sheets, and bathroom. Pt. states she spoke to mother yesterday and set more boundaries and told her "enough is enough". Pt. states she spoke with ex and told him "don't contact me." Pt. continues to struggle with relationships. Patient engaged in discussion.   Session Time: 10:15 -11:00  Participation Level: Active  Behavioral Response: CasualAlertDepressed  Type of Therapy: Group Therapy, psychoeducation, psychotherapy  Treatment Goals addressed: Coping  Interventions: CBT, DBT, Solution Focused, Supportive and Reframing  Summary:  Clinician led discussion on trust and how to build trust. Group discussed ways that trust plays a role in their lives, their struggles, and how it affects their relationships. Cln discussed boundaries, communication, trust building, and healthy/unhealthy traits as pertained to pts relationship struggles.   Therapist Response: Patient engaged in discussion. Pt engaged in discussion on trust and how to build healthy  relationships.   Session Time: 11:00 -12:00   Participation Level: Active  Behavioral Response: CasualAlertDepressed  Type of Therapy: Group Therapy, psychoeducation, psychotherapy  Treatment Goals addressed: Coping  Interventions: CBT, DBT, Solution Focused, Supportive and Reframing  Summary:  Clinician continued topic of distress tolerance skills and ACCEPTS. Group learned Pushing Away, Thoughts, Sensation and Grounding Techniques.  Pt's discussed how they can utilize these skills.  Therapist Response: Patient engaged in discussion.     Session Time: 12:00- 1:00  Participation Level: Active  Behavioral Response: CasualAlertDepressed  Type of Therapy: Group Therapy, Psychoeducation; Psychotherapy  Treatment Goals addressed: Coping  Interventions: CBT; Solution focused; Supportive; Reframing Summary: 12:00 - 12:50:  Cln introduced STOP and TIPP skills. Group members discussed how to incorporate these skills into their lives. 12:50 -1:00 Clinician led check-out. Clinician assessed for immediate needs, medication compliance and efficacy, and safety concerns Therapist Response: Patient engaged activity and discussion. Pt reports this will be helpful when they are feeling irritable.    At [place], patient rates her mood at a 8 on a scale of 1-10 with 10 being great. Patient reports plans of "not getting married" and organize kitchen.  Patient demonstrates some progress as evidenced by creating tasks and to do lists. Patient denies SI/HI/self-harm at the end of group.    Suicidal/Homicidal: Nowithout intent/plan   Plan: Pt will continue in PHP while working to decrease depression and anxiety symptoms and increase ability to manage symptoms in a healthy manner.   Diagnosis: MDD (major depressive disorder), recurrent severe, without psychosis ([place]) [F33.2]    1. MDD (major depressive disorder), recurrent severe, without psychosis ([place])   2. GAD (generalized anxiety  disorder)       [name], LPCA, LCASA 12/17/2018
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
## 2018-12-17 NOTE — Telephone Encounter (Signed)
Needs Diflucan- [facility] [place]

## 2018-12-18 ENCOUNTER — Encounter (HOSPITAL_COMMUNITY): Payer: Self-pay | Admitting: Psychiatry

## 2018-12-20 ENCOUNTER — Other Ambulatory Visit: Payer: Self-pay

## 2018-12-20 ENCOUNTER — Other Ambulatory Visit (HOSPITAL_COMMUNITY): Payer: 59 | Admitting: Licensed Clinical Social Worker

## 2018-12-20 DIAGNOSIS — F332 Major depressive disorder, recurrent severe without psychotic features: Secondary | ICD-10-CM

## 2018-12-20 DIAGNOSIS — R011 Cardiac murmur, unspecified: Secondary | ICD-10-CM | POA: Diagnosis not present

## 2018-12-20 DIAGNOSIS — F411 Generalized anxiety disorder: Secondary | ICD-10-CM

## 2018-12-20 DIAGNOSIS — R4589 Other symptoms and signs involving emotional state: Secondary | ICD-10-CM | POA: Diagnosis not present

## 2018-12-20 DIAGNOSIS — R45851 Suicidal ideations: Secondary | ICD-10-CM | POA: Diagnosis not present

## 2018-12-20 DIAGNOSIS — E119 Type 2 diabetes mellitus without complications: Secondary | ICD-10-CM | POA: Diagnosis not present

## 2018-12-20 DIAGNOSIS — F4310 Post-traumatic stress disorder, unspecified: Secondary | ICD-10-CM | POA: Diagnosis not present

## 2018-12-20 DIAGNOSIS — K219 Gastro-esophageal reflux disease without esophagitis: Secondary | ICD-10-CM | POA: Diagnosis not present

## 2018-12-20 DIAGNOSIS — J45909 Unspecified asthma, uncomplicated: Secondary | ICD-10-CM | POA: Diagnosis not present

## 2018-12-20 NOTE — Progress Notes (Signed)
Virtual Visit via Video Note  I connected with [name] on 12/20/18 at  9:00 AM EDT by a video enabled telemedicine application and verified that I am speaking with the correct person using two identifiers.   I discussed the limitations of evaluation and management by telemedicine and the availability of in person appointments. The patient expressed understanding and agreed to proceed.  I discussed the assessment and treatment plan with the patient. The patient was provided an opportunity to ask questions and all were answered. The patient agreed with the plan and demonstrated an understanding of the instructions.   The patient was advised to call back or seek an in-person evaluation if the symptoms worsen or if the condition fails to improve as anticipated.    Daily Group Progress Note  Program: IOP  Group Time: 9am-12pm ? Participation Level: Active ? Behavioral Response: Appropriate ? Type of Therapy: Group Therapy; process group, psycho-educational group ? Summary of Progress: The purpose of this group is to utilize CBT and DBT skills in a group setting to increase use of healthy coping skills and decrease frequency and intensity of active mental health symptoms. 9am-10:30am Clinician checked in with group members, assessing for SI/HI/psychosis and overall level of functioning. Clinician inquired about completed self-care activity from the previous day. Clinician asked about a time that clients felt guilty, and assisted in exploring their guilt through empathic confrontation. Clinician facilitated a discussion on guilt and shame, and evaluated client's interpretations of their definitions. Clinician summarized the moral nature of guilt and how it can evolve into shame with the assistance of negative self-talk. 10:30am-12pm Clinician led clients in practicing the use of positive self-talk through varying prompts, such as "I feel strong when..", "A good quality i'm learning  is..", Something I am grateful for is.." . Themes were identified during the discussion and processed in a group capacity. Clinician then facilitated the creation of "A-Z Coping Skills" for clients to refer to during difficult times. Clinician clarified some of the strategies for future use. Client has made progress in therapy as evidenced by her ability to identify her motivating factors to maintain her course of treatment, reframe her current employment concerns, as an opportunity to grow other areas of her life. Client actively participated in group and contributed many coping strategies that can be of use to the group.   [name], LCSW

## 2018-12-20 NOTE — Progress Notes (Signed)
 .  Virtual Visit via Video Note  I connected with [name] on 12/20/18 at  9:00 AM EDT by a video enabled telemedicine application and verified that I am speaking with the correct person using two identifiers.   I discussed the limitations of evaluation and management by telemedicine and the availability of in person appointments. The patient expressed understanding and agreed to proceed.   Daily Group Progress Note  Program: IOP  Group Time: 9am-12pm  Participation Level: Active  Behavioral Response: Appropriate  Type of Therapy: Group Therapy; psycho-educational group, process group  Summary of Progress:  The purpose of this group is to utilize CBT and DBT skills in a group setting to increase use of healthy coping skills and decrease frequency and intensity of active mental health symptoms. 9am-11am Clinician checked in with clients, assessing for SI/HI/psychosis and overall level of functioning. Clinician inquired about completed self-care activities and high point and low point of the previous day, as well as a skill practiced. Clinician presented the topic of Radical Acceptance. Clinician reviewed CBT triangle and the connection of thoughts, feelings, and behaviors. Clinician and group members discussed what is and is not radical acceptance, what things need to be accepted and why as well as factors that interfere with acceptance. Clinician and group discussed what is something they would like to radically accept and the current barriers. Clinician requested clients identify a positive self-trait and discussed difficulty with positive self-talk. 11am-12pm [place]in co-facilitated group focused on grief and losses. Client identified trouble with family dynamics, loss of identify and passion related to work, and loss of father as struggles. Client reports putting on a facade of strength while feeling weak  I discussed the assessment and treatment plan with the patient. The patient  was provided an opportunity to ask questions and all were answered. The patient agreed with the plan and demonstrated an understanding of the instructions.   The patient was advised to call back or seek an in-person evaluation if the symptoms worsen or if the condition fails to improve as anticipated.   [name], LCSW

## 2018-12-21 ENCOUNTER — Other Ambulatory Visit (HOSPITAL_COMMUNITY): Payer: 59 | Admitting: Licensed Clinical Social Worker

## 2018-12-21 ENCOUNTER — Other Ambulatory Visit: Payer: Self-pay

## 2018-12-21 DIAGNOSIS — F4310 Post-traumatic stress disorder, unspecified: Secondary | ICD-10-CM | POA: Diagnosis not present

## 2018-12-21 DIAGNOSIS — R45851 Suicidal ideations: Secondary | ICD-10-CM | POA: Diagnosis not present

## 2018-12-21 DIAGNOSIS — F332 Major depressive disorder, recurrent severe without psychotic features: Secondary | ICD-10-CM | POA: Diagnosis not present

## 2018-12-21 DIAGNOSIS — F411 Generalized anxiety disorder: Secondary | ICD-10-CM

## 2018-12-21 DIAGNOSIS — R011 Cardiac murmur, unspecified: Secondary | ICD-10-CM | POA: Diagnosis not present

## 2018-12-21 DIAGNOSIS — K219 Gastro-esophageal reflux disease without esophagitis: Secondary | ICD-10-CM | POA: Diagnosis not present

## 2018-12-21 DIAGNOSIS — J45909 Unspecified asthma, uncomplicated: Secondary | ICD-10-CM | POA: Diagnosis not present

## 2018-12-21 DIAGNOSIS — E119 Type 2 diabetes mellitus without complications: Secondary | ICD-10-CM | POA: Diagnosis not present

## 2018-12-21 DIAGNOSIS — R4589 Other symptoms and signs involving emotional state: Secondary | ICD-10-CM | POA: Diagnosis not present

## 2018-12-21 NOTE — Psych (Signed)
[facility] BH PHP THERAPIST PROGRESS NOTE  [name] [id number]  Session Time: 9:00 - 10:15   Participation Level: Active  Behavioral Response: CasualAlertDepressed  Type of Therapy: Group Therapy  Treatment Goals addressed: Coping  Interventions: CBT, DBT, Solution Focused, Supportive and Reframing  Summary: Clinician led check-in regarding current stressors and situation, and review of patient completed daily inventory. Clinician utilized active listening and empathetic response and validated patient emotions. Clinician facilitated processing group on pertinent issues.   Therapist Response: [name] is a 38 y.o. female who presents with depression and anxiety symptoms. Patient arrived within time allowed and reports that she is feeling "good." Patient rates her mood at a 5 on a scale of 1-10 with 10 being great. Pt reports she slept longer than usual but "it was choppy sleep". Pt. states she got her daughter to school late this morning and "every little thing just got to me this morning". Pt. states she did not have a panic attack last night or this morning. Pt. states yesterday she got home, made dinner, celebrated fiance's birthday. Pt states she managed to get to bed at 9:30PM. Pt. states fiance asked her if she wanted to get married next week and she "was ok with it". Patient continues to struggle with impulsivity. Patient engaged in discussion.     Session Time: 10:15 -11:00  Participation Level: Active  Behavioral Response: CasualAlertDepressed  Type of Therapy: Group Therapy, psychoeducation, psychotherapy  Treatment Goals addressed: Coping  Interventions: CBT, DBT, Solution Focused, Supportive and Reframing  Summary:  Clinician led discussion on setting goals and trying to achieve balance. Patients discussed areas they feel are out of balance and brainstormed ways to make progress.     Therapist Response: Patient engaged and participated in discussion.        Session Time: 11:00 -12:00   Participation Level: Active   Behavioral Response: CasualAlertDepressed   Type of Therapy: Group Therapy, OT   Treatment Goals addressed: Coping   Interventions: Psychosocial skills training, Supportive,    Summary:  Occupational Therapy group   Therapist Response: Patient engaged in group. See OT note.       Session Time: 12:00- 1:00  Participation Level: Active  Behavioral Response: CasualAlertAnxious and Depressed  Type of Therapy: Group Therapy, Psychoeducation; Psychotherapy  Treatment Goals addressed: Coping  Interventions: CBT; Solution focused; Supportive; Reframing  Summary: 12:00 - 12:50: Clinician introduced topic of psychological health. Group watched "How to practice emotional first aid" TED talk and discussed emotional hygiene, using positive actions, responses, self-esteem protection and battling skills to prioritize psychological health. 12:50 -1:00 Clinician led check-out. Clinician assessed for immediate needs, medication compliance and efficacy, and safety concerns  Therapist Response:  Pt engaged in discussion regarding how to use emotional hygiene skills to address feelings of loneliness, failure, rejection or rumination. Pt discussed using positive self-esteem protection skills to address feelings of rejection. At [place], patient rates her mood at a 10 on a scale of 1-10 with 10 being great. Patient reports weekend plans to hang out at home and go to church. Patient demonstrates some progress as evidenced by being more open in group. Patient denies SI/HI/self-harm thoughts at the end of group.     Suicidal/Homicidal: Nowithout intent/plan   Plan: Pt will continue in PHP while working to decrease depression and anxiety symptoms and increase ability to manage symptoms in a healthy manner.   Diagnosis: MDD (major depressive disorder), recurrent severe, without psychosis ([place]) [F33.2]    1. MDD (  major  depressive disorder), recurrent severe, without psychosis ([place])   2. GAD (generalized anxiety disorder)       [name], LPCA, LCASA 12/21/2018

## 2018-12-21 NOTE — Psych (Signed)
11:00-12:15  Spiritual care group focused on topic of "community," exploring group member's current experience of community, what they value and what they hope for in community. Group engaged in facilitated discussion and then chose pieces of art or pictures that represented their current experience of community and what they long for. Group facilitation drew on Narrative and Adlerian frameworks as well as brief CBT.  KW presented to group with a calm mood and matching affect. She was present throughout group and contributed voluntarily to discussion, often responding to what other group members shared. KW expressed that though she finds solitude, particularly in nature, helpful at times, it can often lead to "bottling up feelings." She shared that she would like to expand her community by inviting more people into her life, particularly other adults at church and in her daughter's [employer] troop. KW identified that she has developed awareness of the need to set boundaries in how much vulnerability she allows herself to have in certain relationships, and she expressed that she is growing in how to discern who to trust with vulnerable parts of her story.   Doris Cheadle, Counseling Intern

## 2018-12-21 NOTE — Psych (Signed)
   [facility] BH PHP THERAPIST PROGRESS NOTE  [name] [id number]  Session Time: 9:00 - 11:00   Participation Level: Active  Behavioral Response: CasualAlertDepressed  Type of Therapy: Group Therapy  Treatment Goals addressed: Coping  Interventions: CBT, DBT, Solution Focused, Supportive and Reframing  Summary: Clinician led check-in regarding current stressors and situation, and review of patient completed daily inventory. Clinician utilized active listening and empathetic response and validated patient emotions. Clinician facilitated processing group on pertinent issues.   Therapist Response: [name] is a 38 y.o. female who presents with depression and anxiety symptoms. Patient arrived within time allowed and reports that she is feeling "in a pretty good mood this morning." Patient rates her mood at a 9 on a scale of 1-10 with 10 being great. Pt reports she took her sleep medication last night and had a couple of nightmares she does not remember and a mild panic attack. Pt. states "I had a mild setback" with daughter this morning, but used her boundary skills to compromise with daughter.  Pt. states she took her daughter to the dentist yesterday and took a two hour nap when she got home. Pt states she spoke with her mother yesterday and set a boundary about her mother making suggestions on how she should live her life. Pt. states she compromised with mother by telling her "I want to hear what you have to say, but I don't have to do it." Pt. states she has a lead on a possible new job but cannot make up her mind about what she wants to do. Patient continues to struggle with making decisions. Patient engaged in discussion.     Session Time: 11:00 -12:15  Participation Level: Active  Behavioral Response: CasualAlertDepressed  Type of Therapy: Group Therapy, psychotherapy  Treatment Goals addressed: Coping  Interventions: Strengths based, reframing, Supportive,   Summary:   Spiritual Care group  Therapist Response: Patient engaged in group. See chaplain note.         Session Time: 12:15 - 1:00  Participation Level: Active  Behavioral Response: CasualAlertDepressed  Type of Therapy: Group Therapy  Treatment Goals addressed: Coping  Interventions: Systems analyst, Supportive  Summary:  Reflection Group: Patients encouraged to practice skills and interpersonal techniques or work on mindfulness and relaxation techniques. The importance of self-care and making skills part of a routine to increase usage were stressed   Therapist Response: Patient engaged and participated appropriately.        Session Time: 1:00- 2:00  Participation Level: Active  Behavioral Response: CasualAlertDepressed  Type of Therapy: Group Therapy, Psychoeducation  Treatment Goals addressed: Coping  Interventions: relaxation training; Supportive; Reframing  Summary: 12:45 - 1:50: Relaxation group: Cln led group focused on retraining the body's response to stress.   1:50 -2:00 Clinician led check-out. Clinician assessed for immediate needs, medication compliance and efficacy, and safety concerns   Therapist Response: Patient engaged in activity and discussion. Pt. chose to leave at 1:30. Patient denies SI/HI/self-harm thoughts at the end of group.         Suicidal/Homicidal: Nowithout intent/plan   Plan: Pt will continue in PHP while working to decrease depression and anxiety symptoms and increase ability to manage symptoms in a healthy manner.   Diagnosis: MDD (major depressive disorder), recurrent severe, without psychosis ([place]) [F33.2]    1. MDD (major depressive disorder), recurrent severe, without psychosis ([place])   2. GAD (generalized anxiety disorder)       [name], LPCA, LCASA 12/21/2018
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
## 2018-12-22 ENCOUNTER — Other Ambulatory Visit (HOSPITAL_COMMUNITY): Payer: 59 | Attending: Psychiatry | Admitting: Licensed Clinical Social Worker

## 2018-12-22 ENCOUNTER — Other Ambulatory Visit: Payer: Self-pay

## 2018-12-22 DIAGNOSIS — F332 Major depressive disorder, recurrent severe without psychotic features: Secondary | ICD-10-CM | POA: Diagnosis not present

## 2018-12-22 DIAGNOSIS — Z79899 Other long term (current) drug therapy: Secondary | ICD-10-CM | POA: Insufficient documentation

## 2018-12-22 DIAGNOSIS — F411 Generalized anxiety disorder: Secondary | ICD-10-CM

## 2018-12-22 NOTE — Progress Notes (Signed)
Virtual Visit via Video Note  I connected with [name] on 12/22/18 at  9:00 AM EDT by a video enabled telemedicine application and verified that I am speaking with the correct person using two identifiers.   I discussed the limitations of evaluation and management by telemedicine and the availability of in person appointments. The patient expressed understanding and agreed to proceed.    I discussed the assessment and treatment plan with the patient. The patient was provided an opportunity to ask questions and all were answered. The patient agreed with the plan and demonstrated an understanding of the instructions.   The patient was advised to call back or seek an in-person evaluation if the symptoms worsen or if the condition fails to improve as anticipated.     Daily Group Progress Note  Program: IOP  Group Time: 9am-12pm  Participation Level: Active  Behavioral Response: Appropriate  Type of Therapy:  Group Therapy; process group, psycho-educational group  Summary of Progress:  The purpose of this group is to utilize CBT and DBT skills in a group setting, via WebEx, to increase use of healthy coping skills and decrease frequency and intensity of active mental health symptoms.  9am-10:30am Clinician checked in with group members, assessing for SI/HI/psychosis and overall level of functioning. Clinician inquired about self-care skills completed from previous day. Clinician requested group members list 3 positive traits about themselves. Process group is co-facilitated by Mable Fill, focused on grief and types of losses in our lives.  10:30am-12pm Clinician presented the topic of assertive communication. Clinician and group members discussed traits of passive, aggressive, and assertive communication. Clinician and group members reviewed Interpersonal Effectiveness skills and practiced scenarios in session. Clinician and group members discussed possibilities or relationship changes  based on use of assertive communication to maintain healthy boundaries. Clinician reviewed gratitude skill with clients. Clinician encouraged clients to put a schedule in place and maintain it on daily, in group or not, in preparation for completion of group. Clinician inquired about self-care activity planned for the day. Client engaged in discussions about sense of self in relation to changing of jobs and role in the family.  [name], LCSW
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
## 2018-12-22 NOTE — Progress Notes (Signed)
Virtual Visit via Video Note  I connected with [name] on 12/22/18 at  9:00 AM EDT by a video enabled telemedicine application and verified that I am speaking with the correct person using two identifiers.   I discussed the limitations of evaluation and management by telemedicine and the availability of in person appointments. The patient expressed understanding and agreed to proceed.  I discussed the assessment and treatment plan with the patient. The patient was provided an opportunity to ask questions and all were answered. The patient agreed with the plan and demonstrated an understanding of the instructions.   The patient was advised to call back or seek an in-person evaluation if the symptoms worsen or if the condition fails to improve as anticipated.   Daily Group Progress Note  Program: IOP  Group Time: 9am-12pm  Participation Level: Active  Behavioral Response: Appropriate  Type of Therapy:  Group Therapy; psycho-educational group, process group  Summary of Progress: The purpose of this group is to utilize CBT and DBT skills in a group setting to increase use of healthy coping skills and decrease frequency and intensity of active mental health symptoms.   9am-10:30am Clinician checked in with group members, assessing for SI/HI/psychosis and overall level of functioning. Clinician inquired about completed self care activities from previous day. Clinician presented the topic of Mind Traps. Clinician reviewed with clients the connection between thoughts, feelings, and behaviors. Clinician emphasized that mind traps are learned ways of reacting to events in life, much like other patterns or habits and it is possible for [place] to learn new ways of thinking that help [place] avoid many of the emotional upheavals caused by our own mind traps. Clinician and group members reviewed each mind trap, provided examples from daily life, and practiced challenging each trap. Clinician and group  members processed the impact of mind traps on mental health recovery. Clinician presented Progressive Muscle Relaxation activity for group members to participate via video on personal computer. 10:30am-12pm Clinician presented Roadblocks to Healthy Thinking that can interfere with change and contribute to relapse. Clinician and group members processed what Roadblocks have in common, why we use these, and the impact these have on relationships and mental health recovery. Clinician and group members utilized personal examples of how to address each type of roadblock and practice developing new thinking habits. Clinician validated client feelings that changing thought processing can be  frustrating and is a long term solution with repetitive practice. Clinician and group members reviewed Thinking and Behavior Cycle and focused on ways to break cycle of permission statements and unhelpful behaviors. Clinician checked out with group members planned self care activity for the day. Client identified personally used mindtraps and pros and cons of each. Client was able to identify alternative thoughts creating alternative emotions and behaviors. Barbara Messier, LCSW

## 2018-12-22 NOTE — Progress Notes (Signed)
Virtual Visit via Video Note  I connected with [name] on 12/22/18 at  9:00 AM EDT by a video enabled telemedicine application and verified that I am speaking with the correct person using two identifiers.   I discussed the limitations of evaluation and management by telemedicine and the availability of in person appointments. The patient expressed understanding and agreed to proceed. I discussed the assessment and treatment plan with the patient. The patient was provided an opportunity to ask questions and all were answered. The patient agreed with the plan and demonstrated an understanding of the instructions.   The patient was advised to call back or seek an in-person evaluation if the symptoms worsen or if the condition fails to improve as anticipated.     Daily Group Progress Note  Program: IOP  Group Time: 9am-12pm  Participation Level: Active  Behavioral Response: Appropriate  Type of Therapy:  Group Therapy; process group, psycho-educational group  Summary of Progress:  The purpose of this contact is to utilize CBT and DBT skills in a group setting to increase use of healthy coping skills and decrease frequency and intensity of active mental health symptoms. This group is completed via secure tele-health services.  9am-10:30am Clinician checked in with group members, assessing for SI/HI/psychosis and overall level of functioning. Clinician allowed clients time to discuss relevant topics including anxiety related to transitions and uncertainty with Stay at [place] and the future of employment and finances. Clinician actively listened, validating client feelings. Clinician presented the topic of Rumination and Worry. Clinician and group members discussed the difference between future focused worry and past focused rumination. Clinician and group members discussed ways to avoid circular thinking and utilize problem solving mode. 10:30am-12pm Clinician presented the topic  of Mindfulness. Clinician and group members discussed video content related to Mind wandering vs Focused mode. Clinician and group members brainstormed ways to add mindful moments into their day. Clinician and clients discussed the Gratitude exercise from psycho-educational video in response to automatic negative or judgmental thoughts. Clinician encouraged clients to add at least one moment of gratitude into each day. Clinician inquired about self-care activity for the day. Client engaged in group discussions, providing feedback to group members. Client shared how she coped with stress appropriately over the weekend.  [name], LCSW

## 2018-12-23 ENCOUNTER — Other Ambulatory Visit: Payer: Self-pay

## 2018-12-23 ENCOUNTER — Other Ambulatory Visit (HOSPITAL_COMMUNITY): Payer: 59 | Admitting: Licensed Clinical Social Worker

## 2018-12-23 DIAGNOSIS — F332 Major depressive disorder, recurrent severe without psychotic features: Secondary | ICD-10-CM

## 2018-12-23 DIAGNOSIS — F411 Generalized anxiety disorder: Secondary | ICD-10-CM

## 2018-12-23 DIAGNOSIS — Z79899 Other long term (current) drug therapy: Secondary | ICD-10-CM | POA: Diagnosis not present

## 2018-12-24 ENCOUNTER — Other Ambulatory Visit (HOSPITAL_COMMUNITY): Payer: 59 | Admitting: Licensed Clinical Social Worker

## 2018-12-24 ENCOUNTER — Other Ambulatory Visit: Payer: Self-pay

## 2018-12-24 DIAGNOSIS — F332 Major depressive disorder, recurrent severe without psychotic features: Secondary | ICD-10-CM | POA: Diagnosis not present

## 2018-12-24 DIAGNOSIS — Z79899 Other long term (current) drug therapy: Secondary | ICD-10-CM | POA: Diagnosis not present
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
## 2018-12-25 ENCOUNTER — Other Ambulatory Visit: Payer: Self-pay | Admitting: Internal Medicine

## 2018-12-26 ENCOUNTER — Telehealth: Payer: Self-pay | Admitting: Internal Medicine

## 2018-12-26 NOTE — Telephone Encounter (Signed)
Requesting refill lantus Sent as requested in lieu of PCP

## 2018-12-27 ENCOUNTER — Telehealth (HOSPITAL_COMMUNITY): Payer: Self-pay | Admitting: Psychiatry

## 2018-12-27 ENCOUNTER — Other Ambulatory Visit: Payer: Self-pay

## 2018-12-27 ENCOUNTER — Other Ambulatory Visit (HOSPITAL_COMMUNITY): Payer: 59 | Admitting: Licensed Clinical Social Worker

## 2018-12-27 DIAGNOSIS — F332 Major depressive disorder, recurrent severe without psychotic features: Secondary | ICD-10-CM

## 2018-12-27 DIAGNOSIS — Z79899 Other long term (current) drug therapy: Secondary | ICD-10-CM | POA: Diagnosis not present

## 2018-12-27 NOTE — Telephone Encounter (Signed)
D:  Pt called inquiring about her upcoming discharge date tomorrow.  Pt is requesting to stay a couple of more days.  A:  Pt's insurance is no pre-cert needed; so pt can stay until Friday 12-31-18.  D/C on 12-31-18.  R:  Pt receptive.

## 2018-12-28 ENCOUNTER — Other Ambulatory Visit (HOSPITAL_COMMUNITY): Payer: 59 | Admitting: Family

## 2018-12-28 ENCOUNTER — Encounter (HOSPITAL_COMMUNITY): Payer: Self-pay

## 2018-12-28 ENCOUNTER — Other Ambulatory Visit: Payer: Self-pay

## 2018-12-28 DIAGNOSIS — F411 Generalized anxiety disorder: Secondary | ICD-10-CM

## 2018-12-28 DIAGNOSIS — F332 Major depressive disorder, recurrent severe without psychotic features: Secondary | ICD-10-CM

## 2018-12-28 NOTE — Progress Notes (Signed)
  Virtual Visit via Video Note  I connected with [name] on 12/28/18 at  9:00 AM EDT by a video enabled telemedicine application and verified that I am speaking with the correct person using two identifiers.   I discussed the limitations of evaluation and management by telemedicine and the availability of in person appointments. The patient expressed understanding and agreed to proceed.  I discussed the assessment and treatment plan with the patient. The patient was provided an opportunity to ask questions and all were answered. The patient agreed with the plan and demonstrated an understanding of the instructions.   The patient was advised to call back or seek an in-person evaluation if the symptoms worsen or if the condition fails to improve as anticipated.    Daily Group Progress Note  Program: IOP  Group Time: 9am-12pm  Participation Level: Active  Behavioral Response: Appropriate  Type of Therapy:  Group Therapy; psycho-educational group, process group  Summary of Progress:  The purpose of this group is to utilize CBT and DBT skills in a group setting via telehealth to increase use of healthy coping skills and decrease frequency and intensity of active mental health symptoms.  9am- 10am Psychoeducational group provided by Psychiatrist. Clients were provided time to ask questions related to medication, side effects, and symptoms. Pharmacist provided information on classes of medications and purposes of medications related to symptom management.  10am- 12am Clinician checked in with group members, assessing for SI/HI/psychosis. Clinician and group members discussed stressors and skills from the weekend. Clinician actively listened to clients, utilizing summarizing and re-framing statements. Clinician praised group members for identifying cognitive distortions they are actively working on addressing. Clinician presented DBT skill of Ride the Wave of emotions. Clinician and  group members processed common overwhelming emotions and discussed benefits to observing and naming emotion in order to get unstuck. Additional discussion on experiencing emotion fully without trying to increase or block it. Clinician and group members processed not having to act with urgency on each emotion. Clinician and group members processed pausing to identify multiple emotions for a situation and identify which one to address in the moment. Clinician and group members processed utilizing self-compassion to radically accept emotions and limit judgment related to having automatic thoughts and feelings. Clinician presented Sensory calm down skills and 'coping life SAVERS' as additional considerations for coping strategies. Clinician invited group members to describe their happy place as a form of visualization as a coping skill.  Clinician presented clients with positive affirmations, and requested that group members share three affirmations they believe to be related to themselves. Client described her current mood as "having a down day", including feelings of exhaustion. She was tearful however, she has progressed in therapy as evidenced by her ability to disclose her feelings to the group.Client described getting checked on by her parent and accepting the assistance offered by her system of support. Client noted her self care as shopping and shared her attitudes toward responding to stressors. Client disclosed her concern with seeing people from her past and express concern about how to appropriately respond. Client successfully identified triggers in her past that have altered core beliefs and the complementary maladaptive coping skill.     [name], LCSW

## 2018-12-28 NOTE — Progress Notes (Signed)
Virtual Visit via Video Note  I connected with [name] on 12/28/18 at  9:00 AM EDT by a video enabled telemedicine application and verified that I am speaking with the correct person using two identifiers.   I discussed the limitations of evaluation and management by telemedicine and the availability of in person appointments. The patient expressed understanding and agreed to proceed.   I discussed the assessment and treatment plan with the patient. The patient was provided an opportunity to ask questions and all were answered. The patient agreed with the plan and demonstrated an understanding of the instructions.   The patient was advised to call back or seek an in-person evaluation if the symptoms worsen or if the condition fails to improve as anticipated.     Daily Group Progress Note  Program: IOP  Group Time: 9am-12pm  Participation Level: Active  Behavioral Response: Appropriate  Type of Therapy:  Group Therapy; process group, psycho-educational group  Summary of Progress:  The purpose of this contact is to utilize CBT and DBT skills via telehealth in a group setting to increase use of healthy coping skills and decrease frequency and intensity of active mental health symptoms.  9am-10:30am Clinician and group members discussed relevant stressors and current coping skills. Clinician inquired about completed self-care activity from the previous day and completed self-care activity from previous day. Clinician and group members reviewed My Relapse Checklist and Tips for Avoiding Relapse. Clinician facilitated discussion with clients related to practicing skills despite '[place]' to build mastering in use of helpful skills. Clinician and group members discussed changes in lifestyle which could be needed based on avoiding triggers for relapse of substance use or intense symptoms of intense anxiety or depression. Clinician and group members discussed areas for changes in  life. Clinician validated that change in multiple changes at the same time can be difficult and making changes one at a time is a good option to avoid feeling overwhelmed. 10:30am-12pm Clinician and clients reviewed TIPP skill and discussed Box Breathing and 5-7-9 breathing as specific options for breathing skills. Clients agreed to practice breathing at least one time per day over the weekend. Clinician provided clients with '101 Ways to [place] with Stress' and 'Seize the Opportunity of Home Samule Dry' to prompt creative options for activities during Stay At Home order. Clinician praised clients wanting to create and keep a routine despite being inside most of the day. Clinician inquired about self-care activity planned for the weekend. Client engaged in group discussion, providing and receiving feedback from group members. Client agrees to Geographical information systems officer over the weekend.  [name], LCSW

## 2018-12-28 NOTE — Progress Notes (Signed)
Virtual Visit via Video Note  I connected with [name] on 12/28/18 at  9:00 AM EDT by a video enabled telemedicine application and verified that I am speaking with the correct person using two identifiers.   I discussed the limitations of evaluation and management by telemedicine and the availability of in person appointments. The patient expressed understanding and agreed to proceed. I discussed the assessment and treatment plan with the patient. The patient was provided an opportunity to ask questions and all were answered. The patient agreed with the plan and demonstrated an understanding of the instructions.   The patient was advised to call back or seek an in-person evaluation if the symptoms worsen or if the condition fails to improve as anticipated.     Daily Group Progress Note  Program: IOP  Group Time: 9am-12pm  Participation Level: Active  Behavioral Response: Appropriate  Type of Therapy:  Group Therapy; process group, psycho-educational group  Summary of Progress: The purpose of this contact is to utilize CBT and DBT skills in a group setting via telemedicine to increase use of healthy coping skills and decrease frequency and intensity of active mental health symptoms.  9am-11am Clinician checked in with group members, assessing for SI/HI/psychosis and overall level of functioning. Clinician notified clients how to contact clinician or case manager independently for additional needs. Clinician presented the topic of Self Compassion. Clinician shared video of differences between self-esteem and self-compassion. Clinician and clients processed events leading to creation of core values and self-concept. Clinician and group members role played in session how to address negative self-talk and replace with self-compassionate responses. Clinician and group members reviewed Opposite Action skill.  11am-12pm Psycho-educational group co-facilitated to include mindfulness  activity including focus on breathing and yoga. Client engaged in group discussion about sense of self and struggle with allowing less than perfection.  Barbara Messier, LCSW

## 2018-12-28 NOTE — Progress Notes (Unsigned)
attempted to follow-up for medication questions. No answers on mobile number or webex. - patient hasnt logged in group as of yet. T.Agamjot Kilgallon NP

## 2018-12-29 ENCOUNTER — Telehealth: Payer: Self-pay | Admitting: Internal Medicine

## 2018-12-29 ENCOUNTER — Other Ambulatory Visit (HOSPITAL_COMMUNITY): Payer: 59 | Admitting: Licensed Clinical Social Worker

## 2018-12-29 ENCOUNTER — Other Ambulatory Visit: Payer: Self-pay

## 2018-12-29 ENCOUNTER — Ambulatory Visit (HOSPITAL_COMMUNITY): Payer: 59

## 2018-12-29 DIAGNOSIS — F332 Major depressive disorder, recurrent severe without psychotic features: Secondary | ICD-10-CM | POA: Diagnosis not present

## 2018-12-29 DIAGNOSIS — Z79899 Other long term (current) drug therapy: Secondary | ICD-10-CM | POA: Diagnosis not present

## 2018-12-29 DIAGNOSIS — R112 Nausea with vomiting, unspecified: Secondary | ICD-10-CM | POA: Diagnosis not present

## 2018-12-29 DIAGNOSIS — B349 Viral infection, unspecified: Secondary | ICD-10-CM | POA: Diagnosis not present

## 2018-12-29 MED ORDER — ONDANSETRON HCL 4 MG PO TABS: ORAL_TABLET | ORAL | 5 refills | Status: DC

## 2018-12-29 NOTE — Telephone Encounter (Signed)
Asks Zofran Rx for nausea Sent to [facility]

## 2018-12-30 ENCOUNTER — Other Ambulatory Visit: Payer: Self-pay

## 2018-12-30 ENCOUNTER — Other Ambulatory Visit (HOSPITAL_COMMUNITY): Payer: 59 | Admitting: Licensed Clinical Social Worker

## 2018-12-30 ENCOUNTER — Ambulatory Visit (HOSPITAL_COMMUNITY): Payer: 59

## 2018-12-30 DIAGNOSIS — F332 Major depressive disorder, recurrent severe without psychotic features: Secondary | ICD-10-CM | POA: Diagnosis not present

## 2018-12-30 DIAGNOSIS — Z79899 Other long term (current) drug therapy: Secondary | ICD-10-CM | POA: Diagnosis not present
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
## 2018-12-30 NOTE — Patient Instructions (Signed)
D:  Patient completed MH-IOP(virtually) on 12-31-18.  A:  Will follow up with Dr. Montel Culver on 01-04-19 @ 9 a.m and Lise Auer, LCSW on 01-05-19 @ 9 a.m.  Encouraged support groups.  Return to work on 01-10-19 without any restrictions.

## 2018-12-30 NOTE — Progress Notes (Signed)
  Virtual Visit via Video Note  I connected with [name] on 12/30/18 at  9:00 AM EDT by a video enabled telemedicine application and verified that I am speaking with the correct person using two identifiers.   I discussed the limitations of evaluation and management by telemedicine and the availability of in person appointments. The patient expressed understanding and agreed to proceed.   I discussed the assessment and treatment plan with the patient. The patient was provided an opportunity to ask questions and all were answered. The patient agreed with the plan and demonstrated an understanding of the instructions.   The patient was advised to call back or seek an in-person evaluation if the symptoms worsen or if the condition fails to improve as anticipated.    Daily Group Progress Note  Program: IOP  Group Time: 9am-12pm  Participation Level: Active  Behavioral Response: Appropriate  Type of Therapy:  Group Therapy; psycho-educational group, process group  Summary of Progress:  The purpose of this group is to utilize CBT and DBT skills in a group setting to increase use of healthy coping skills and decrease frequency and intensity of active mental health symptoms.  9am-10:30am Clinician checked in with group members, assessing for SI/HI/psychosis. Clinician and group members discussed stressors and skills from the weekend. Clinician facilitated ice breaker activity with clients identifying a favorite something in any topic and exploring gratitude for their respective subject. Clinician introduced the topic of needs identification and assessed the ways clients may go about fulfilling those needs and the feelings that arise when they are not met.  Clinician actively listened to clients, utilizing summarizing and re-framing statements. Clinician praised group members for identifying cognitive distortions they are actively working on addressing.   10:30am-12pm Clinician invited  group members to consider their attitudes toward asking for help including a cost benefit analysis and an exploration of past events that influence group member's current behavior. Clinician presented clients with positive affirmations, and requested that group members share an affirmation they believe to be related to themselves. Client  has progressed in therapy as evidenced by her insight into the events that influenced her attitude toward asking for help. Client described the factors toward her hesitance for vulnerability as being comparisons from family members and her ingrained sense of independence from her upbringing. Client did not feel well and left the group early. Client actively participated in the group.   [name], LCSW

## 2018-12-31 ENCOUNTER — Other Ambulatory Visit (HOSPITAL_COMMUNITY): Payer: 59 | Admitting: Family

## 2018-12-31 ENCOUNTER — Other Ambulatory Visit: Payer: Self-pay

## 2018-12-31 ENCOUNTER — Ambulatory Visit (HOSPITAL_COMMUNITY): Payer: 59

## 2018-12-31 ENCOUNTER — Encounter (HOSPITAL_COMMUNITY): Payer: Self-pay | Admitting: Family

## 2018-12-31 DIAGNOSIS — F411 Generalized anxiety disorder: Secondary | ICD-10-CM

## 2018-12-31 DIAGNOSIS — F332 Major depressive disorder, recurrent severe without psychotic features: Secondary | ICD-10-CM

## 2018-12-31 NOTE — Psych (Signed)
[facility] PHP THERAPIST PROGRESS NOTE  [name] [id number]  Session Time: 9:00 - 11:00   Participation Level: Active  Behavioral Response: CasualAlertDepressed  Type of Therapy: Group Therapy  Treatment Goals addressed: Coping  Interventions: CBT, DBT, Solution Focused, Supportive and Reframing  Summary: Clinician led check-in regarding current stressors and situation, and review of patient completed daily inventory. Clinician utilized active listening and empathetic response and validated patient emotions. Clinician facilitated processing group on pertinent issues.   Therapist Response: RAYLYNNE CUBBAGE is a 38 y.o. female who presents with depression and anxiety symptoms. Patient arrived within time allowed and reports feeling "not good." Patient rates her mood at a 2 on a scale of 1-10 with 10 being great. Pt states she was not in group yesterday because she "couldn't make myself get up." Pt reports Friday and Saturday went well and on Sunday she felt herself "sliding" with increase in racing thoughts, depressed mood, and disrupted sleep. Pt shares she attempted distraction skills at first, but then "it got hard and I just stopped." Pt reports she was motivated to get out today by her daughter. Pt is able to process and recognizes she was wallowing yesterday. Pt identifies that poor sleep is a bigger factor in mood regulation than she accounted for in the past. Patient engaged in discussion.       Session Time: 11:00 -12:00  Participation Level:Active  Behavioral Response:CasualAlertDepressed  Type of Therapy: Group Therapy, OT  Treatment Goals addressed: Coping  Interventions:Psychosocial skills training, Supportive,   Summary:Occupational Therapy group  Therapist Response: Patient engaged in group. See OT note.         Session Time: 12:00 - 12:45  Participation Level: Active  Behavioral Response: CasualAlertDepressed  Type of  Therapy: Group Therapy  Treatment Goals addressed: Coping  Interventions: Systems analyst, Supportive  Summary:  Reflection Group: Patients encouraged to practice skills and interpersonal techniques or work on mindfulness and relaxation techniques. The importance of self-care and making skills part of a routine to increase usage were stressed   Therapist Response: Patient engaged and participated appropriately.        Session Time: 12:45- 2:00  Participation Level: Active  Behavioral Response: CasualAlertDepressed  Type of Therapy: Group Therapy, Psychoeducation  Treatment Goals addressed: Coping  Interventions: CBT; Supportive; Reframing  Summary: 12:45 - 1:50: Cln led discussion on handout "11 beliefs that make life better." Group discussed the beliefs that were a struggle for them, why, and how to reframe.  1:50 -2:00 Clinician led check-out. Clinician assessed for immediate needs, medication compliance and efficacy, and safety concerns  Therapist Response: Pt engaged in discussion. Pt shares the "It's okay to make mistakes" is a struggle for her because she is "hard on" herself and feels that her mistakes have been made a big deal about in the past. Pt identifies recognizing offering herself grace can help her reframe this belief.  At [place], patient rates her mood at a 6 on a scale of 1-10 with 10 being great. Patient reports afternoon plans of running errands and "spoiling" herself with pampering. Patient demonstrates some progress as evidenced by increased mood after processing. Patient denies SI/HI/self-harm at the end of group.     Suicidal/Homicidal: Nowithout intent/plan   Plan: Pt will discharge from PHP due to meeting treatment goals of decreased depression and anxiety symptoms and increased ability to manage symptoms in a healthy manner. Pt's progress noted by self report, observation, and scales. Pt and provider are aligned with  discharge. Pt will step down to IOP within this agency beginning tomorrow, 12/15/18 to increase further stabilization and support. Pt denies SI/HI at discharge.    Diagnosis: MDD (major depressive disorder), recurrent severe, without psychosis ([place]) [F33.2]    1. MDD (major depressive disorder), recurrent severe, without psychosis ([place])       Lorin Glass, LCSW 12/31/2018

## 2018-12-31 NOTE — Progress Notes (Unsigned)
Virtual Visit via Telephone Note  I connected with [name] on 12/31/18 at  9:00 AM EDT by telephone and verified that I am speaking with the correct person using two identifiers.   I discussed the limitations, risks, security and privacy concerns of performing an evaluation and management service by telephone and the availability of in person appointments. I also discussed with the patient that there may be a patient responsible charge related to this service. The patient expressed understanding and agreed to proceed.    I discussed the assessment and treatment plan with the patient. The patient was provided an opportunity to ask questions and all were answered. The patient agreed with the plan and demonstrated an understanding of the instructions.   The patient was advised to call back or seek an in-person evaluation if the symptoms worsen or if the condition fails to improve as anticipated.  I provided 15 minutes of non-face-to-face time during this encounter.   Derrill Center, NP   [facility] Intensive [facility]patient Program Discharge Summary  [name] [id number]  Admission date: 12/17/2018 Discharge date: 12/31/2018  Reason for admission: Per admission assessment note: [name] is a 38 y.o.Caucasianfemale presents worsening depression. 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 Katiereports her symptoms started roughly about 3 weeks ago. States she she became stressed and overwhelmed because she is responsible for the actions of other employees.Farrie reports a fear that she will be fired due to 1 of her coworkermoney drawer wasout of balance. "they will blam me because I am the lead."States she is has been approved for FMLA however is unable to find documents to prove her absences on a fewoccasions.Currently denying suicidal or homicidal ideations. States she is being followed by  psychiatry in the past when she was prescribed Viibryd 40 mg states she is currently taking and tolerating medications well    Chemical Use History: Denied   Family of Origin Issues: [name] reported ongoing communication struggles with her childrens father, Reported her current partner is supportive.   Progress in Program Toward Treatment Goals: Marytza has attended and participated  with daily group sessions. Patient has successful completed [facility] programing.  Reported " uneasy feelings regarding discharging today." stated overall the program has been helpful.   Progress (rationale):  Patient to follow-up [place] and Lise Auer  LCSW on the 4/15-16. Patient to  Be provided with additional outpatient group services  -Declined medication refills at this time as she would like to discusses medication changes or adjustments with Psychiatrist.   Take all medications as prescribed. Keep all follow-up appointments as scheduled.  Do not consume alcohol or use illegal drugs while on prescription medications. Report any adverse effects from your medications to your primary care provider promptly.  In the event of recurrent symptoms or worsening symptoms, call 911, a crisis hotline, or go to the nearest emergency department for evaluation.    Derrill Center, NP 12/31/2018

## 2018-12-31 NOTE — Psych (Signed)
   [facility] BH PHP THERAPIST PROGRESS NOTE  MARSHELLE BILGER [id number]  Session Time: 9:00 - 11:00   Participation Level: Active  Behavioral Response: CasualAlertDepressed  Type of Therapy: Group Therapy  Treatment Goals addressed: Coping  Interventions: CBT, DBT, Solution Focused, Supportive and Reframing  Summary: Clinician led check-in regarding current stressors and situation, and review of patient completed daily inventory. Clinician utilized active listening and empathetic response and validated patient emotions. Clinician facilitated processing group on pertinent issues.   Therapist Response: TY OSHIMA is a 38 y.o. female who presents with depression and anxiety symptoms. Patient arrived within time allowed and reports feeling "pretty good." Patient rates her mood at a 5/6 on a scale of 1-10 with 10 being great. Pt shares she got a flat tire on the way to group Friday and was able to "handle the situation better than I would have in the past." Pt reports she got a call from a co-worker who stated "management is still making case against me." Pt shares she struggled to sleep after the conversation so she took 2 sleeping pills and slept well. Pt shares she made breakfast on Saturday, spent time with daughter, and focused on getting tasks accomplished. Pt shares she "chilled" on Sunday. Patient engaged in discussion.        Session Time: 11:00 -12:00  Participation Level:Active  Behavioral Response:CasualAlertDepressed  Type of Therapy: Group Therapy, OT  Treatment Goals addressed: Coping  Interventions:Psychosocial skills training, Supportive,   Summary:Occupational Therapy group  Therapist Response: Patient engaged in group. See OT note.         Session Time: 12:00 - 12:45  Participation Level: Active  Behavioral Response: CasualAlertDepressed  Type of Therapy: Group Therapy  Treatment Goals addressed: Coping  Interventions:  Systems analyst, Supportive  Summary:  Reflection Group: Patients encouraged to practice skills and interpersonal techniques or work on mindfulness and relaxation techniques. The importance of self-care and making skills part of a routine to increase usage were stressed   Therapist Response: Patient engaged and participated appropriately.        Session Time: 12:45- 2:00  Participation Level: Active  Behavioral Response: CasualAlertDepressed  Type of Therapy: Group Therapy, Psychoeducation  Treatment Goals addressed: Coping  Interventions: CBT; Supportive; Reframing  Summary: 12:45 - 1:50: Clinician introduced topic of cognitive distortions. Cln educated on what cognitive distortions are and how they affect [place]. Cln introduced "Catch, Challenge, Change" and group reviewed cognitive distortion handout and came up with examples to work on "catch." 1:50 -2:00 Clinician led check-out. Clinician assessed for immediate needs, medication compliance and efficacy, and safety concerns     Therapist Response: Patient engaged activity and discussion. Pt was able to identify real life examples of cognitive distortions discussed.  At [place], patient rates her mood at a 7 on a scale of 1-10 with 10 being great. Patient reports afternoon plans of picking up a prescription and having a [place] Day dinner at home.Patient demonstrates some progress as evidenced by identifying cognitive distortions. Patient denies SI/HI/self-harm at the end of group.   Suicidal/Homicidal: Nowithout intent/plan   Plan: Pt will continue in PHP while working to decrease depression and anxiety symptoms and increase ability to manage symptoms in a healthy manner.   Diagnosis: MDD (major depressive disorder), recurrent severe, without psychosis ([place]) [F33.2]    1. MDD (major depressive disorder), recurrent severe, without psychosis ([place])   2. Difficulty coping     [name],  [facility], LCASA 12/31/2018

## 2018-12-31 NOTE — Psych (Signed)
   [facility] BH PHP THERAPIST PROGRESS NOTE  [name] [id number]  Session Time: 9:00 - 11:00   Participation Level: Active  Behavioral Response: CasualAlertDepressed  Type of Therapy: Group Therapy  Treatment Goals addressed: Coping  Interventions: CBT, DBT, Solution Focused, Supportive and Reframing  Summary: Clinician led check-in regarding current stressors and situation, and review of patient completed daily inventory. Clinician utilized active listening and empathetic response and validated patient emotions. Clinician facilitated processing group on pertinent issues.   Therapist Response: [name] is a 38 y.o. female who presents with depression and anxiety symptoms. Patient arrived within time allowed and reports feeling "pretty good." Patient rates her mood at a 5/6 on a scale of 1-10 with 10 being great. Pt shares she got a flat tire on the way to group Friday and was able to "handle the situation better than I would have in the past." Pt reports she got a call from a co-worker who stated "management is still making case against me." Pt shares she struggled to sleep after the conversation so she took 2 sleeping pills and slept well. Pt shares she made breakfast on Saturday, spent time with daughter, and focused on getting tasks accomplished. Pt shares she "chilled" on Sunday. Patient engaged in discussion.        Session Time: 11:00 -12:00  Participation Level:Active  Behavioral Response:CasualAlertDepressed  Type of Therapy: Group Therapy, OT  Treatment Goals addressed: Coping  Interventions:Psychosocial skills training, Supportive,   Summary:Occupational Therapy group  Therapist Response: Patient engaged in group. See OT note.         Session Time: 12:00 - 12:45  Participation Level: Active  Behavioral Response: CasualAlertDepressed  Type of Therapy: Group Therapy  Treatment Goals addressed: Coping  Interventions:  Systems analyst, Supportive  Summary:  Reflection Group: Patients encouraged to practice skills and interpersonal techniques or work on mindfulness and relaxation techniques. The importance of self-care and making skills part of a routine to increase usage were stressed   Therapist Response: Patient engaged and participated appropriately.        Session Time: 12:45- 2:00  Participation Level: Active  Behavioral Response: CasualAlertDepressed  Type of Therapy: Group Therapy, Psychoeducation  Treatment Goals addressed: Coping  Interventions: CBT; Supportive; Reframing  Summary: 12:45 - 1:50: Clinician continued topic of distress tolerance skills and introduced Self Soothe skill. Group members discussed ways they would utilize self soothe in their everyday life.   1:50 -2:00 Clinician led check-out. Clinician assessed for immediate needs, medication compliance and efficacy, and safety concerns     Therapist Response: Pt engaged in discussion. Pt reports understanding of skills. Pt identifies using relaxing music and petting her dog as self-soothe activities she will try.  At [place], patient rates her mood at a 7 on a scale of 1-10 with 10 being great. Patient reports having "no specific plans" for the afternoon. Patient demonstrates some progress as evidenced by using emotional regulation skills to manage stressful situations. Patient denies SI/HI/self-harm at the end of group.   Suicidal/Homicidal: Nowithout intent/plan   Plan: Pt will continue in PHP while working to decrease depression and anxiety symptoms and increase ability to manage symptoms in a healthy manner.   Diagnosis: MDD (major depressive disorder), recurrent severe, without psychosis ([place]) [F33.2]    1. MDD (major depressive disorder), recurrent severe, without psychosis ([place])   2. GAD (generalized anxiety disorder)     [name], [facility], LCASA 12/31/2018

## 2019-01-01 IMAGING — DX DG CHEST 2V
2 series · 2 of 2 positions shown · non-contrast
Comparison: April 09, 2017

CLINICAL DATA: Cough and congestion

EXAM:
CHEST  2 VIEW

[chest pa]
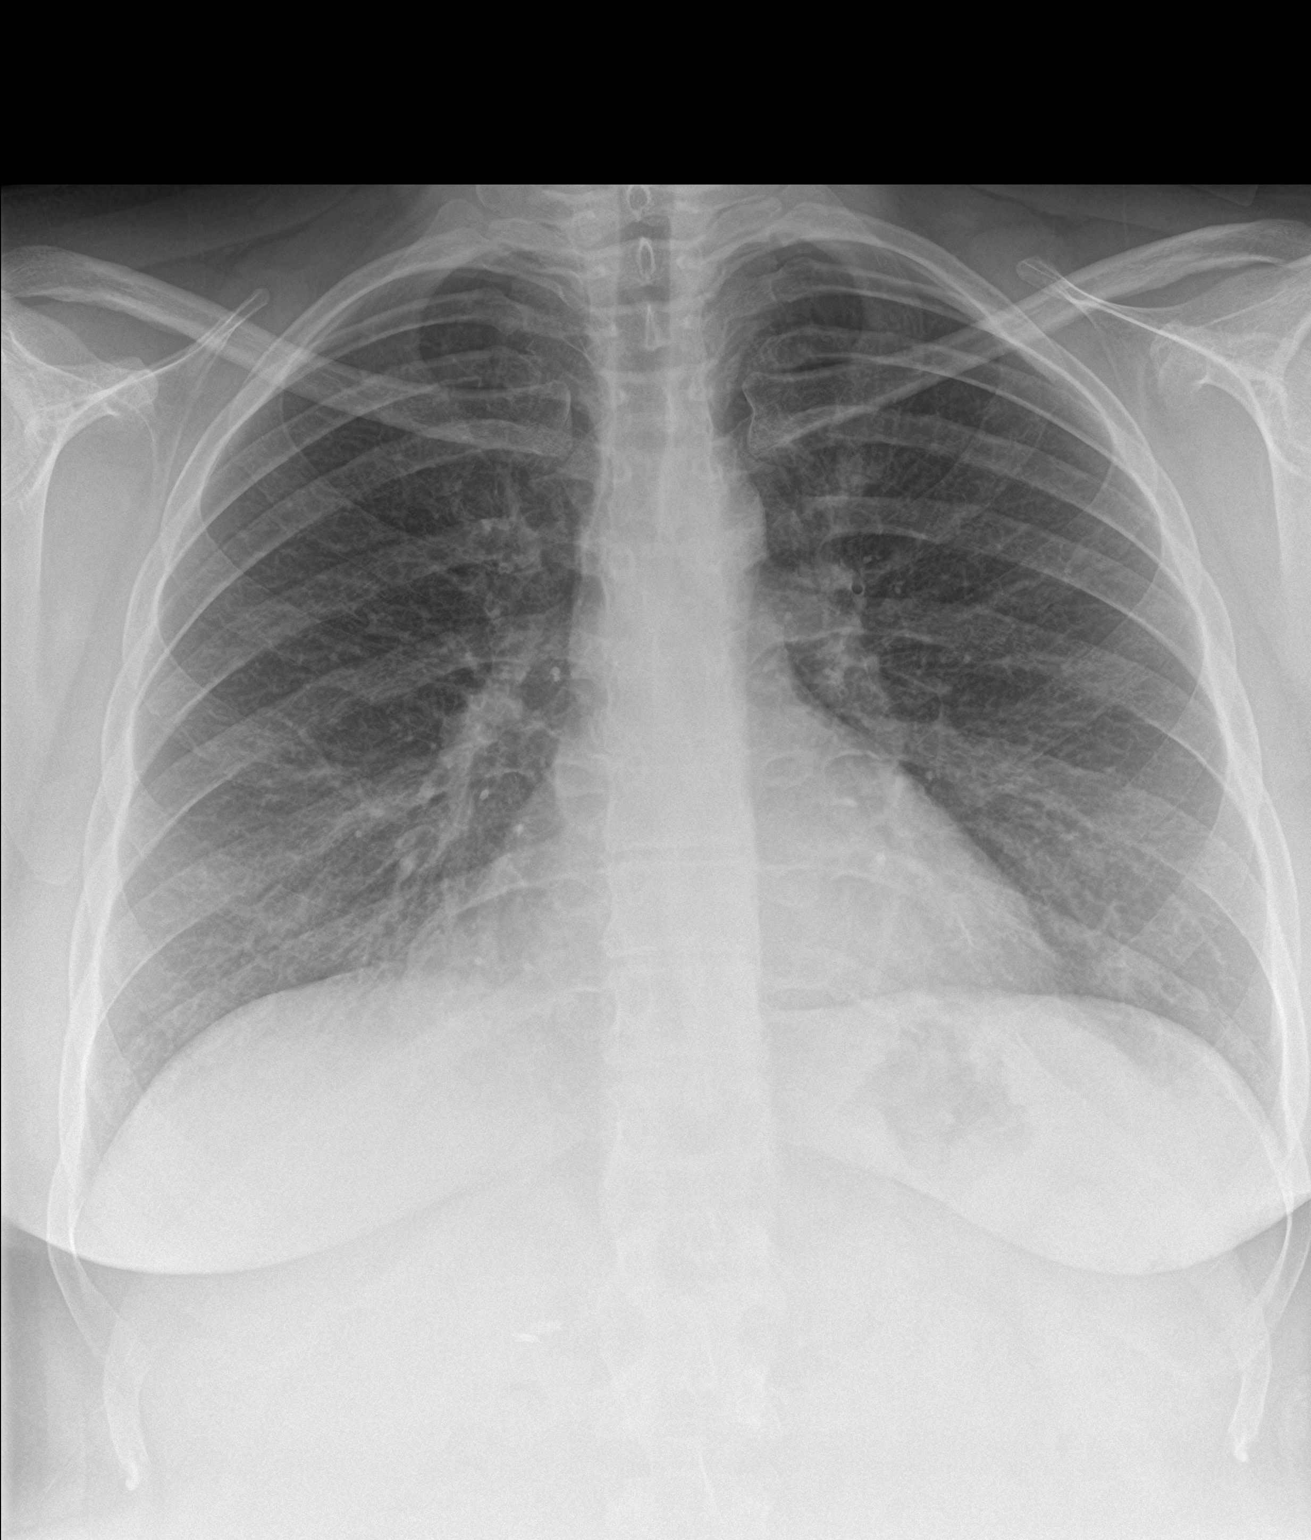

[chest lat]
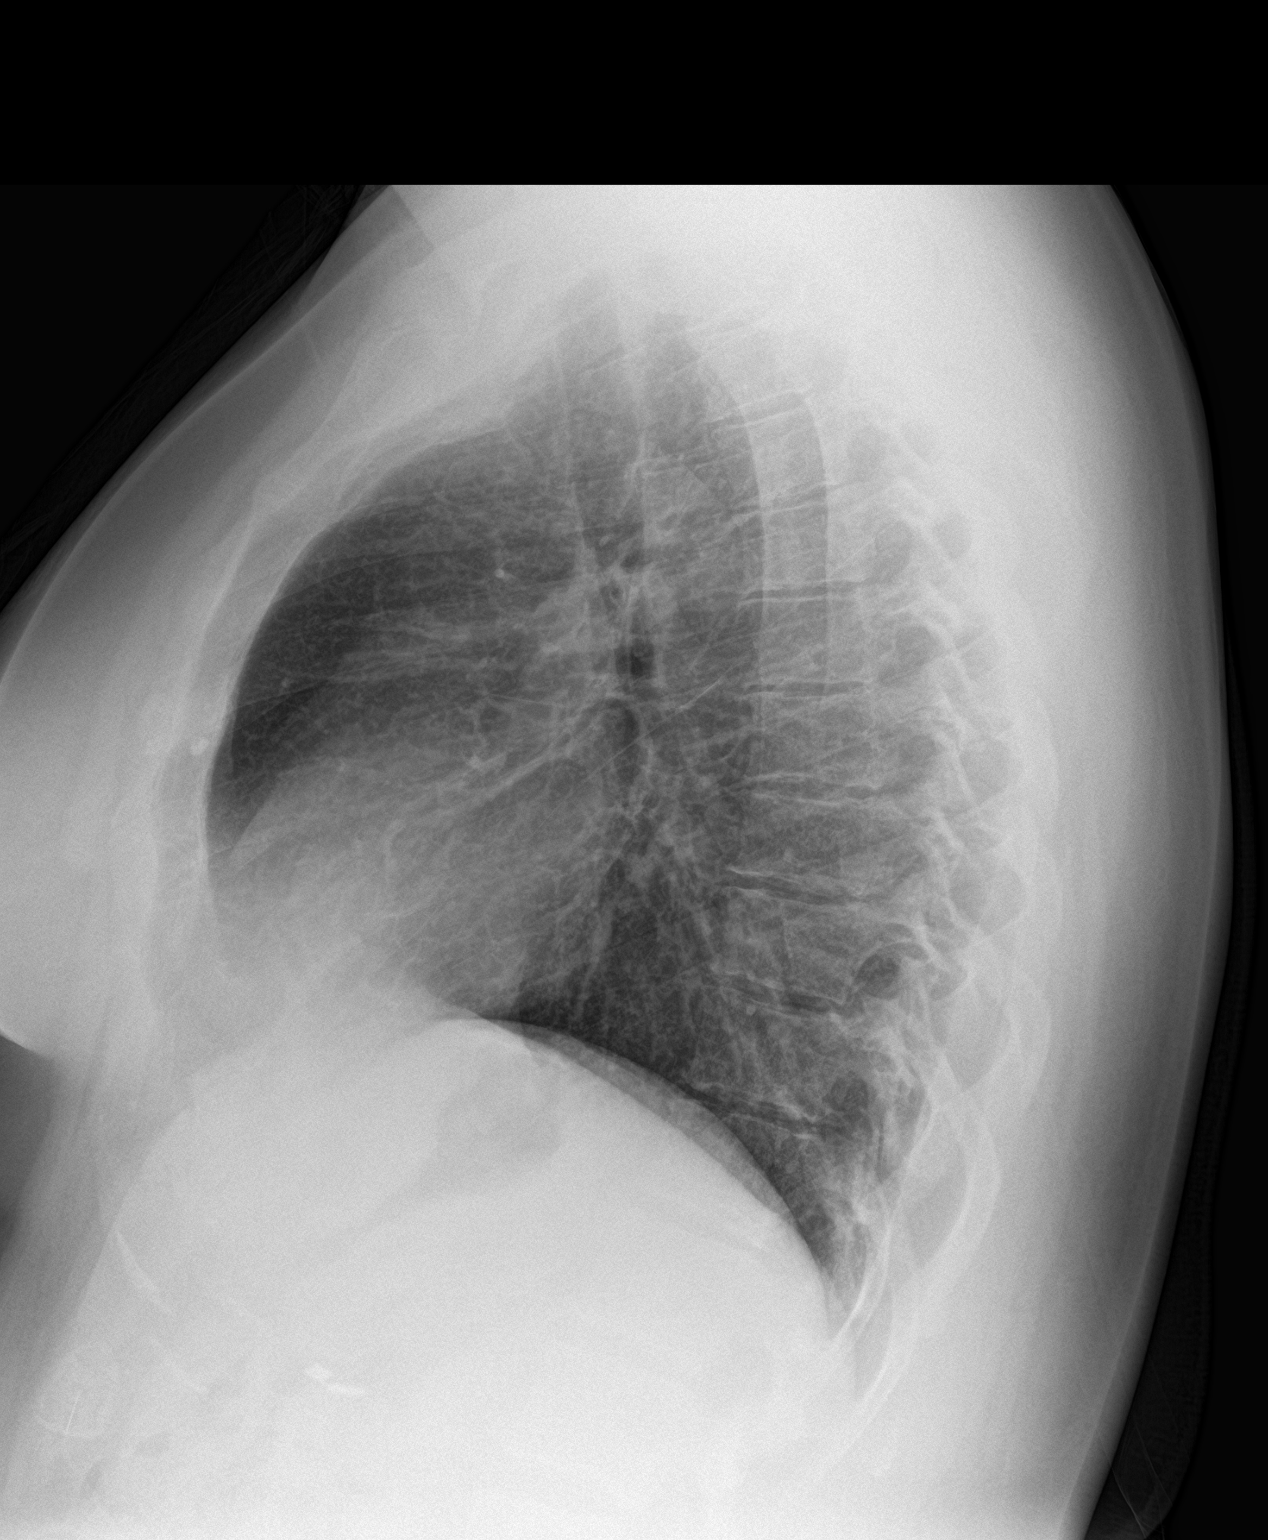

[2 of 2 positions shown; findings below may reference images not displayed]

FINDINGS: There is no edema or consolidation. The heart size and pulmonary
vascularity are normal. No adenopathy. No bone lesions.
IMPRESSION: No edema or consolidation.

## 2019-01-04 ENCOUNTER — Ambulatory Visit (INDEPENDENT_AMBULATORY_CARE_PROVIDER_SITE_OTHER): Payer: 59 | Admitting: Psychiatry

## 2019-01-04 ENCOUNTER — Other Ambulatory Visit: Payer: Self-pay

## 2019-01-04 DIAGNOSIS — F410 Panic disorder [episodic paroxysmal anxiety] without agoraphobia: Secondary | ICD-10-CM | POA: Diagnosis not present

## 2019-01-04 DIAGNOSIS — F3181 Bipolar II disorder: Secondary | ICD-10-CM | POA: Diagnosis not present

## 2019-01-04 MED ORDER — QUETIAPINE FUMARATE 50 MG PO TABS: 50.0000 mg | ORAL_TABLET | Freq: Every day | ORAL | 0 refills | Status: DC

## 2019-01-04 MED ORDER — PRAZOSIN HCL 2 MG PO CAPS: 2.0000 mg | ORAL_CAPSULE | Freq: Every day | ORAL | 0 refills | Status: DC

## 2019-01-04 MED ORDER — ALPRAZOLAM 0.5 MG PO TABS: 0.2500 mg | ORAL_TABLET | Freq: Three times a day (TID) | ORAL | 0 refills | Status: DC | PRN

## 2019-01-04 NOTE — Progress Notes (Signed)
  Virtual Visit via Video Note  I connected with [name] on 01/04/19 at  9:00 AM EDT by a video enabled telemedicine application and verified that I am speaking with the correct person using two identifiers.   I discussed the limitations of evaluation and management by telemedicine and the availability of in person appointments. The patient expressed understanding and agreed to proceed.  I discussed the assessment and treatment plan with the patient. The patient was provided an opportunity to ask questions and all were answered. The patient agreed with the plan and demonstrated an understanding of the instructions.   The patient was advised to call back or seek an in-person evaluation if the symptoms worsen or if the condition fails to improve as anticipated.    Daily Group Progress Note  Program: IOP  Group Time: 9am-12pm  Participation Level: Active  Behavioral Response: Appropriate  Type of Therapy:  Group Therapy; process group, psycho-educational group  Summary of Progress:  The purpose  of this group is to utilize CBT and DBT skills in a group setting via telehealth to increase use of healthy coping skills and decrease frequency and intensity of mental health symptoms.  9am-10:30am Clinician met with clients assessing for SI/HI/psychosis and overall level of functioning. Processing group co-facilitated by [facility] with a focus on grief and loss. 10:30am-12pm Clinician presened the topic of setting health boundaries. Clinician and group members discussed signs of unhealthy boundaries, how to define and maintain health boundaries. Clinician and group members reviewed traits of ridget, purouse and health boundaries as well as types of personal boundaries and tips for setting and maintaining healthy boundaries. Clinician provided clients with activity focused on identifying core values based on who clients admire and why. Client processed sense of self and changes in boundaries  related to loss of changes in relationship dynamics. Client identified strong faight, love, kindness and understanding as core values.  [name], LCSW

## 2019-01-04 NOTE — Progress Notes (Signed)
BH MD/PA/NP OP Progress Note  01/04/2019 9:39 AM [name]  MRN:  [id number] Interview was conducted using WebEx teleconferencing application and I verified that I was speaking with the correct person using two identifiers. I discussed the limitations of evaluation and management by telemedicine and  the availability of in person appointments. Patient expressed understanding and agreed to proceed.  Chief Complaint: Depression/mood swings every few days, panic attacks daily.  HPI: 38 yo divorced female who has been in [place] and then in IOP after she started to experience increased depression and multiple daily panic attacks about 2 months ago. 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 Patient is currently on FMLA but is considering not returning to work there and even applying for disability. For other details please refer to initial psychiatric evaluation by Barbara Ala NP from 12/17/18.   Visit Diagnosis:    ICD-10-CM   1. Bipolar 2 disorder, major depressive episode ([place]) F31.81   2. Panic disorder F41.0     Past Psychiatric History: Patient admits to a hx of recurrent depressive episodes first one as a 38 yo when she had suicidal thoughts. She also reports episode of depression combined with mood fluctuations, insomnia, racing thoughts, increased energy 10 years ago. She has never attempted suicide, never experienced psychotic symptoms. She was never psychiatrically hospitalized.  Past Medical History:  Past Medical History:  Diagnosis Date  . Abscess of left axilla - PREVOTELLA BIVIA & Staph Coag Neg 07/12/2012   MODERATE PREVOTELLA BIVIA Note: BETA LACTAMASE NEGATIVE    . Asthma    as a child  . Cancer ([place])    skin tag rt breast  . Cellulitis 06/01/2014   RT INNER THIGH  .  Depression    hx  . Diabetes ([place])   . Diabetes mellitus    IDDM, Insulin pump; followed by Dr. [name]  . GERD (gastroesophageal reflux disease)    no current meds.  . Heart murmur   . Hidradenitis 04/2012   bilat. thighs, left groin - open areas on thighs  . Hx MRSA infection   . Hydradenitis   . PCOS (polycystic ovarian syndrome)   . Pneumonia    hx  . SVT (supraventricular tachycardia) ([place]) 06/2017  . Vitamin D insufficiency     Past Surgical History:  Procedure Laterality Date  . BREAST SURGERY     right lumpectomy  . BUNIONECTOMY     left  . CARDIAC CATHETERIZATION    . CHOLECYSTECTOMY  12/02/2006   lap. chole.  Marland Kitchen DILATION AND EVACUATION  02/14/2007  . ESOPHAGOGASTRODUODENOSCOPY  11/17/2011   Procedure: ESOPHAGOGASTRODUODENOSCOPY (EGD);  Surgeon: [name], MD;  Location: [facility];  Service: Endoscopy;  Laterality: N/A;  . EYE SURGERY     exc. stye left eye  . HYDRADENITIS EXCISION  04/30/2012   Procedure: EXCISION HYDRADENITIS GROIN;  Surgeon: [name], MD;  Location: [facility];  Service: General;  Laterality: Left;  wide excision hidradenitis bilateral thighs and Left groin  . HYDRADENITIS EXCISION Left 01/13/2014   Procedure: WIDE EXCISION HIDRADENITIS LEFT AXILLA;  Surgeon: [name], MD;  Location: [facility];  Service: General;  Laterality: Left;  . IRRIGATION AND DEBRIDEMENT ABSCESS Right 06/02/2014   Procedure: IRRIGATION AND DEBRIDEMENT ABSCESS;  Surgeon: Nathaneil Canary  Ninfa Linden, MD;  Location: [facility];  Service: General;  Laterality: Right;  . IRRIGATION AND DEBRIDEMENT ABSCESS Left 09/23/2014   Procedure: IRRIGATION AND DEBRIDEMENT ABSCESS/LEFT THIGH;  Surgeon: [name], MD;  Location: [place];  Service: General;  Laterality: Left;  . LEFT HEART CATHETERIZATION WITH CORONARY ANGIOGRAM N/A 07/14/2014   Procedure: LEFT HEART CATHETERIZATION WITH CORONARY ANGIOGRAM;  Surgeon: [name], MD;  Location: [facility];   Service: Cardiovascular;  Laterality: N/A;  . TONSILLECTOMY    . WISDOM TOOTH EXTRACTION      Family Psychiatric History: Additionally her mother's half brother had bipolar disorder and committed suicide as a teenager.  Family History:  Family History  Problem Relation Age of Onset  . Cancer Mother   . Anxiety disorder Mother   . Depression Mother   . Sexual abuse Mother   . Hyperlipidemia Sister   . Hypertension Sister   . Sexual abuse Sister   . Hypertension Father   . Anxiety disorder Father   . Depression Father   . Drug abuse Father   . Stroke Paternal Uncle   . ADD / ADHD Paternal Uncle   . Anxiety disorder Paternal Uncle   . Anxiety disorder Maternal Aunt   . Seizures Maternal Aunt   . Anxiety disorder Paternal Aunt   . Anxiety disorder Maternal Uncle   . ADD / ADHD Cousin   . ADD / ADHD Daughter     Social History:  Social History   Socioeconomic History  . Marital status: Single    Spouse name: Not on file  . Number of children: Not on file  . Years of education: Not on file  . Highest education level: Not on file  Occupational History  . Not on file  Social Needs  . Financial resource strain: Somewhat hard  . Food insecurity:    Worry: Often true    Inability: Sometimes true  . Transportation needs:    Medical: No    Non-medical: No  Tobacco Use  . Smoking status: Current Every Day Smoker    Packs/day: 1.00    Years: 23.00    Pack years: 23.00    Types: Cigarettes  . Smokeless tobacco: Never Used  . Tobacco comment: Has cut back to 1/2 to 3/4 and wants to begin useing patches again once anxiety subsides  Substance and Sexual Activity  . Alcohol use: No    Alcohol/week: 0.0 standard drinks  . Drug use: Yes    Types: Marijuana    Comment: Last use 3 weeks prior   . Sexual activity: Yes    Partners: Male    Birth control/protection: I.U.D.  Lifestyle  . Physical activity:    Days per week: 0 days    Minutes per session: 0 min  . Stress:  Very much  Relationships  . Social connections:    Talks on phone: Twice a week    Gets together: Once a week    Attends religious service: 1 to 4 times per year    Active member of club or organization: Yes    Attends meetings of clubs or organizations: Not on file    Relationship status: Living with partner  Other Topics Concern  . Not on file  Social History Narrative  . Not on file    Allergies:  Allergies  Allergen Reactions  . Latex Hives, Shortness Of Breath and Rash  . Levofloxacin Shortness Of Breath and Rash  . Moxifloxacin Shortness Of Breath and Rash  .  Oxycodone-Acetaminophen Shortness Of Breath, Swelling and Rash    NORCO/VICODIN OK  . Peach [Prunus Persica] Hives and Shortness Of Breath  . Potassium-Containing Compounds Other (See Comments)    IV ROUTE - CAUSES VEINS TO COLLAPS; Reports that it is undiluted K only  . Prednisone Other (See Comments)    SEVERE ELEVATION OF BLOOD SUGAR. Able to tolerate 40 mg  . Propoxyphene N-Acetaminophen Swelling    SWELLING OF FACE AND THROAT  . Rosiglitazone Maleate Swelling    SWELLING OF FACE AND LEGS  . Xolair [Omalizumab] Other (See Comments)    Rash and anaphylaxis  . Adhesive [Tape] Hives, Itching and Rash  . Morphine And Related Other (See Comments)    Causes hallucinations  . Prozac [Fluoxetine Hcl] Other (See Comments)    Made her very aggressive   . Chantix [Varenicline]     dreams  . Citrullus Vulgaris Nausea And Vomiting    Facial swelling  . Clindamycin/Lincomycin   . Humira [Adalimumab]     Does not remember   . Septra [Sulfamethoxazole-Trimethoprim]   . Cefaclor Rash  . Keflex [Cephalexin] Diarrhea and Rash    REACTION: severe migraine  . Promethazine Hcl Other (See Comments)    IV ROUTE ONLY - JITTERY FEELING. Patient reports that it is mild and she has used promethazine since then  PO tablet ok  . Sulfadiazine Rash    Metabolic Disorder Labs: Lab Results  Component Value Date   HGBA1C 10.6  (H) 10/04/2018   MPG 280.48 12/25/2017   MPG 246 (H) 06/01/2014   No results found for: PROLACTIN Lab Results  Component Value Date   CHOL 193 03/22/2018   TRIG 90.0 03/22/2018   HDL 62.80 03/22/2018   CHOLHDL 3 03/22/2018   VLDL 18.0 03/22/2018   LDLCALC 113 (H) 03/22/2018   LDLCALC 88 12/19/2015   Lab Results  Component Value Date   TSH 1.43 03/22/2018   TSH 0.773 12/25/2017    Therapeutic Level Labs: No results found for: LITHIUM No results found for: VALPROATE No components found for:  CBMZ  Current Medications: Current Outpatient Medications  Medication Sig Dispense Refill  . ALPRAZolam (XANAX) 0.5 MG tablet Take 0.5 tablets (0.25 mg total) by mouth 3 (three) times daily as needed for anxiety. 60 tablet 0  . cyclobenzaprine (FLEXERIL) 5 MG tablet Take 1 tablet (5 mg total) by mouth at bedtime as needed for muscle spasms. 30 tablet 0  . doxycycline (VIBRA-TABS) 100 MG tablet Take 1 tablet (100 mg total) by mouth 2 (two) times daily. 14 tablet 0  . fluconazole (DIFLUCAN) 150 MG tablet Take 1 tablet (150 mg total) by mouth daily. 7 tablet 3  . folic acid (FOLVITE) 1 MG tablet Take 1 mg by mouth daily.  5  . glucose blood ([employer] TEST STRIPS) test strip 1 strip by Misc.(Non-Drug; Combo Route) route 4 times daily with meals and nightly. contour next glucose test strips    . HUMALOG 100 UNIT/ML injection     . insulin glargine (LANTUS) 100 UNIT/ML injection INJECT 60 UNITS UNDER THE SKIN ONCE DAILY AS DIRECTED 60 mL 3  . Insulin Human (INSULIN PUMP) SOLN Inject into the skin continuous. *Novolog*    . levonorgestrel (MIRENA) 20 MCG/24HR IUD 1 each by Intrauterine route once. Placed 04/2013    . mupirocin ointment (BACTROBAN) 2 % Apply 1 application topically daily as needed.  2  . ondansetron (ZOFRAN) 4 MG tablet 1 tab every 4-8 hours for nausea 10 tablet 5  .  pantoprazole (PROTONIX) 40 MG tablet Take 1 tablet (40 mg total) by mouth daily. 90 tablet 4  . prazosin (MINIPRESS)  2 MG capsule Take 1 capsule (2 mg total) by mouth at bedtime for 30 days. 30 capsule 0  . QUEtiapine (SEROQUEL) 50 MG tablet Take 1 tablet (50 mg total) by mouth at bedtime for 30 days. 30 tablet 0  . spironolactone (ALDACTONE) 100 MG tablet Take 1 tablet (100 mg total) by mouth daily. 30 tablet 3  . varenicline (CHANTIX PAK) 0.5 MG X 11 & 1 MG X 42 tablet Take one 0.5 mg tablet by mouth once daily for 3 days, then increase to one 0.5 mg tablet twice daily for 4 days, then increase to one 1 mg tablet twice daily. 53 tablet 0  . VIIBRYD 40 MG TABS TAKE 1 TABLET BY MOUTH ONCE DAILY 30 tablet 6   No current facility-administered medications for this visit.     Psychiatric Specialty Exam: Review of Systems  Psychiatric/Behavioral: Positive for depression. The patient is nervous/anxious and has insomnia.   All other systems reviewed and are negative.   There were no vitals taken for this visit.There is no height or weight on file to calculate BMI.  General Appearance: NA  Eye Contact:  Fair  Speech:  Clear and Coherent  Volume:  Normal  Mood:  Anxious, Depressed and fluctuating  Affect:  Appropriate  Thought Process:  Goal Directed  Orientation:  Full (Time, Place, and Person)  Thought Content: Logical   Suicidal Thoughts:  No  Homicidal Thoughts:  No  Memory:  Immediate;   Good Recent;   Good Remote;   Good  Judgement:  Fair  Insight:  Fair  Psychomotor Activity:  NA  Concentration:  Concentration: Good  Recall:  Good  Fund of Knowledge: Good  Language: Good  Akathisia:  NA  Handed:  Right  AIMS (if indicated): not done  Assets:  Communication Skills Desire for Improvement Housing Resilience  ADL's:  Intact  Cognition: WNL  Sleep:  Poor   Screenings: GAD-7     Counselor from 12/14/2018 in [place] Counselor from 11/24/2018 in [place]  Total GAD-7 Score  17  19    PHQ2-9     Counselor from  12/14/2018 in [place] Counselor from 11/24/2018 in [place] Patient Outreach from 03/14/2015 in [place]  PHQ-2 Total Score  4  6  1   PHQ-9 Total Score  19  24  -       Assessment and Plan: 38 yo divorced female who has been in [place] and then in IOP after she started to experience increased depression and multiple daily panic attacks about 2 months ago. 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 Patient is currently on FMLA but is considering not returning to work there and even applying for disability. Patient admits to a hx of recurrent depressive episodes first one as a 38 yo when she had suicidal thoughts. She also reports episode of depression combined with mood fluctuations, insomnia, racing thoughts, increased energy 10 years ago. She has never attempted suicide, never experienced psychotic symptoms. She was never psychiatrically hospitalized. She has no alcohol drug abuse history. Dx impression: Bipolar 2 disorder  major depressive episode; Panic disorder  Plan: Continue Viibryd - had a good response to it - started in September 2019.. DC hydroxyzine (ineffective) and trazodone. Start Seroquel instead for insomni/bipolar depression. Take 50 mg at HS but if sleep does not improve increase dose to 100 mg in 1-2 days. Alprazolam 0.5 mg bid-tid prn panic attacks. [name] will start individual therapy tomorrow with Barbara Auer LCSW. Next appointment with me in two weeks.   Stephanie Acre, MD 01/04/2019, 9:39 AM

## 2019-01-04 NOTE — Psych (Signed)
[facility] PHP THERAPIST PROGRESS NOTE  [name] [id number]  Session Time: 9:00 - 10:00   Participation Level: Active  Behavioral Response: CasualAlertDepressed  Type of Therapy: Group Therapy  Treatment Goals addressed: Coping  Interventions: CBT, DBT, Solution Focused, Supportive and Reframing  Summary: Clinician led check-in regarding current stressors and situation, and review of patient completed daily inventory. Clinician utilized active listening and empathetic response and validated patient emotions. Clinician facilitated processing group on pertinent issues.   Therapist Response: [name] is a 38 y.o. female who presents with depression and anxiety symptoms. Patient arrived within time allowed and reports that she is feeling "happier today." Patient rates her mood at a 10 on a scale of 1-10 with 10 being great. Pt reports she has "some craziness yesterday" but "I didn't let it ruin my day." Pt reports her ex called and "flipped out" stating that pt's mom is calling ex. Pt reports she used deep breathing to help manage her own emotions. Pt reports she cooked and cleaned the kitchen. Pt. continues to struggle with relationships. Patient engaged in discussion.   Session Time: 10:00 -11:00   Participation Level: Active   Behavioral Response: CasualAlertDepressed   Type of Therapy: Group Therapy, psychoeducation, psychotherapy   Treatment Goals addressed: Coping   Interventions: CBT, DBT, Solution Focused, Supportive and Reframing   Summary:  Clinician led discussion on how to build trust in relationships. Group shared how trust has been an issue in past relationships and how to try new strategies moving forward.       Therapist Response: Patient engaged and participated in discussion. Pt shares history of poor boundaries in relationships and jumping in without building trust foundations first.   Session Time: 11:00 -12:00   Participation Level: Active    Behavioral Response: CasualAlertAnxious   Type of Therapy: Group Therapy, OT   Treatment Goals addressed: Coping   Interventions: Psychosocial skills training, Supportive,    Summary:  Occupational Therapy group  Therapist Response:  See OT note.     Session Time: 12:00- 1:00  Participation Level: Active  Behavioral Response: CasualAlertDepressed  Type of Therapy: Group Therapy, Psychoeducation; Psychotherapy  Treatment Goals addressed: Coping  Interventions: CBT; Solution focused; Supportive; Reframing Summary: 12:00 - 12:50: Clinician introduced topic of "Positive Psychology." Group watched "The Happiness Advantage" TED talk and discussed how the "lens" through which they view life affects the way they feel. Pts identified a strategy they would be willing to try to change their "lens."  12:50 -1:00 Clinician led check-out. Clinician assessed for immediate needs, medication compliance and efficacy, and safety concerns.    Therapist Response: Pt engaged in discussion regarding ways to train your mind to scan for the positive. Pt reports willingness to try daily gratitudes as a way to practice. At [place], patient rates her mood at a 10 on a scale of 1-10 with 10 being great. Patient reports plans of possibly baking a cake, going by the bank, and cleaning the house.  Patient demonstrates some progress as evidenced by completing tasks on to-do list. Patient denies SI/HI/self-harm at the end of group.    Suicidal/Homicidal: Nowithout intent/plan   Plan: Pt will continue in PHP while working to decrease depression and anxiety symptoms and increase ability to manage symptoms in a healthy manner.   Diagnosis: MDD (major depressive disorder), recurrent severe, without psychosis ([place]) [F33.2]    1. MDD (major depressive disorder), recurrent severe, without psychosis ([place])   2. GAD (generalized anxiety disorder)  [name], [place], LCASA 01/04/2019

## 2019-01-04 NOTE — Psych (Addendum)
   [facility] PHP THERAPIST PROGRESS NOTE  [name] [id number]  Session Time: 9:00 - 11:00   Participation Level: Active  Behavioral Response: CasualAlertDepressed  Type of Therapy: Group Therapy  Treatment Goals addressed: Coping  Interventions: CBT, DBT, Solution Focused, Supportive and Reframing  Summary: Clinician led check-in regarding current stressors and situation, and review of patient completed daily inventory. Clinician utilized active listening and empathetic response and validated patient emotions. Clinician facilitated processing group on pertinent issues.   Therapist Response: [name] is a 38 y.o. female who presents with depression and anxiety symptoms. Patient arrived within time allowed and reports feeling "alright." Patient rates her mood at an 8 on a scale of 1-10 with 10 being great. Pt reports she stuck to her plan and  talked to her sister and was able to use skills to manage such as boundaries and identifying cognitive distortions. Pt shares she spent time teaching her daughter skills from group. Pt shares she had a migraine yesterday that made the afternoon a little more difficult. Pt reports continued issues with trust. Pt able to process. Patient engaged in discussion.        Session Time: 11:00 -12:00  Participation Level:Active  Behavioral Response:CasualAlertDepressed  Type of Therapy: Group Therapy, OT  Treatment Goals addressed: Coping  Interventions:Psychosocial skills training, Supportive,   Summary:Occupational Therapy group  Therapist Response: Patient engaged in group. See OT note.         Session Time: 12:00 - 12:45  Participation Level: Active  Behavioral Response: CasualAlertDepressed  Type of Therapy: Group Therapy  Treatment Goals addressed: Coping  Interventions: Systems analyst, Supportive  Summary:  Reflection Group: Patients encouraged to practice skills and  interpersonal techniques or work on mindfulness and relaxation techniques. The importance of self-care and making skills part of a routine to increase usage were stressed   Therapist Response: Patient engaged and participated appropriately.        Session Time: 12:45- 2:00  Participation Level: Active  Behavioral Response: CasualAlertDepressed  Type of Therapy: Group Therapy, Psychoeducation  Treatment Goals addressed: Coping  Interventions: CBT; Supportive; Reframing  Summary: 12:00 - 12:50: Cln continued topic of cognitive distortions. Group discussed how to "challenge" unhealthy thought patterns once recognized. Group reviewed "Socratic Questions" handout and went over how to challenge irrational thoughts using that handout. 12:50 -1:00 Clinician led check-out. Clinician assessed for immediate needs, medication compliance and efficacy, and safety concerns   Therapist Response: Patient engaged in activity. Pt states understanding of how to challenge unhealthy thoughts and successfully reframed examples in discussion.   At [place], patient rates her mood at a 10 on a scale of 1-10 with 10 being great. Patient reports afternoon plans of doing laundry and spending time with her daughter teaching her skills from group. Patient demonstrates some progress as evidenced by identifying cognitive distortions and using skills during discussion with sister. Patient denies SI/HI/self-harm at the end of group.     Suicidal/Homicidal: Nowithout intent/plan   Plan: Pt will continue in PHP while working to decrease depression and anxiety symptoms and increase ability to manage symptoms in a healthy manner.   Diagnosis: MDD (major depressive disorder), recurrent severe, without psychosis ([place]) [F33.2]    1. MDD (major depressive disorder), recurrent severe, without psychosis ([place])   2. GAD (generalized anxiety disorder)       [name], [facility], LCASA 01/04/2019

## 2019-01-05 ENCOUNTER — Other Ambulatory Visit: Payer: Self-pay

## 2019-01-05 ENCOUNTER — Encounter (HOSPITAL_COMMUNITY): Payer: Self-pay | Admitting: Psychiatry

## 2019-01-05 ENCOUNTER — Ambulatory Visit (INDEPENDENT_AMBULATORY_CARE_PROVIDER_SITE_OTHER): Payer: 59 | Admitting: Psychiatry

## 2019-01-05 DIAGNOSIS — F3181 Bipolar II disorder: Secondary | ICD-10-CM | POA: Diagnosis not present

## 2019-01-05 DIAGNOSIS — F4312 Post-traumatic stress disorder, chronic: Secondary | ICD-10-CM

## 2019-01-05 DIAGNOSIS — F410 Panic disorder [episodic paroxysmal anxiety] without agoraphobia: Secondary | ICD-10-CM

## 2019-01-05 NOTE — Progress Notes (Signed)
+  Virtual Visit via Telephone Note  I connected with [name] on 01/05/19 at  9:00 AM EDT by telephone and verified that I am speaking with the correct person using two identifiers.   I discussed the limitations, risks, security and privacy concerns of performing an evaluation and management service by telephone and the availability of in person appointments. I also discussed with the patient that there may be a patient responsible charge related to this service. The patient expressed understanding and agreed to proceed.   History of Present Illness: Bipolar 2, Panic Disorder and Posttraumatic Stress Disorder due to adverse life experiences, familial conflict and work related issues.    Observations/Objective: Counselor met with [name] via phone for individual therapy. Counselor began the joining process with [name] to establish the therapeutic relationship. Counselor assessed progress in group treatment. [name] reported that group and medication management were helpful, but she continues to have trauma triggers, panic attacks, anxiety and depression. Counselor focused in on the trauma concerns and invited [name] to share her trauma history. [name] did an excellent job opening up about childhood and adult traumas that still impact her today. Counselor exploreed the work-related trauma, processing solution focused strategies in helping [name] determine which direction would be most beneficial for her. Counselor summarized the session and we made a plan for upcoming appointments.   Assessment and Plan: [name] will communicate her needs with her support system and with the HR department at her job. Counselor will meet again with [name] in 1 week.  Follow Up Instructions: Counselor will set up Webex for the next session.    I discussed the assessment and treatment plan with the patient. The patient was provided an opportunity to ask questions and all were answered. The patient agreed with the plan and  demonstrated an understanding of the instructions.   The patient was advised to call back or seek an in-person evaluation if the symptoms worsen or if the condition fails to improve as anticipated.  I provided 58 minutes of non-face-to-face time during this encounter.   Lise Auer, LCSW

## 2019-01-11 ENCOUNTER — Other Ambulatory Visit: Payer: Self-pay | Admitting: Internal Medicine

## 2019-01-12 ENCOUNTER — Ambulatory Visit (INDEPENDENT_AMBULATORY_CARE_PROVIDER_SITE_OTHER): Payer: 59 | Admitting: Psychiatry

## 2019-01-12 ENCOUNTER — Other Ambulatory Visit: Payer: Self-pay

## 2019-01-12 ENCOUNTER — Telehealth: Payer: Self-pay | Admitting: Internal Medicine

## 2019-01-12 ENCOUNTER — Encounter (HOSPITAL_COMMUNITY): Payer: Self-pay | Admitting: Psychiatry

## 2019-01-12 DIAGNOSIS — F3181 Bipolar II disorder: Secondary | ICD-10-CM

## 2019-01-12 DIAGNOSIS — F4312 Post-traumatic stress disorder, chronic: Secondary | ICD-10-CM

## 2019-01-12 DIAGNOSIS — F410 Panic disorder [episodic paroxysmal anxiety] without agoraphobia: Secondary | ICD-10-CM

## 2019-01-12 NOTE — Telephone Encounter (Signed)
I checked with [name] She asks one script for doxycycline 100 mg, # 180 (90 days, one twice daily) with no refills.

## 2019-01-12 NOTE — Telephone Encounter (Signed)
Doxycycline refill e-sent to [facility] with prn refills

## 2019-01-12 NOTE — Telephone Encounter (Signed)
[facility] spoke to [employer]. Gave verbal to doxy 100 mg #90 1 bid 0 refill. Nothing further needed.

## 2019-01-12 NOTE — Telephone Encounter (Signed)
Dr Annamaria Boots, is this OK to fill?  Thanks.

## 2019-01-12 NOTE — Telephone Encounter (Signed)
[facility] and spoke with Danae Chen. Danae Chen stated they were needing clarification on instructions with the doxy Rx that was sent in. Per Danae Chen, the Rx states the instructions for the Rx state take 1 by mouth two times daily but she stated that previously pt had been taking the Rx once daily and wants to know if the instructions were correct of take 1 by mouth twice daily.  Also Danae Chen stated that there was a note on here stating that the pt was requesting a 90-day supply while [place] and she is wanting to know if pt could get a 90-day supply as requested instead of the quantity of 14 that was sent in. Dr. Annamaria Boots, please advise on all of this. Thanks!

## 2019-01-12 NOTE — Progress Notes (Signed)
Virtual Visit via Video Note  I connected with [name] on 01/12/19 at  9:00 AM EDT by a video enabled telemedicine application and verified that I am speaking with the correct person using two identifiers.   I discussed the limitations of evaluation and management by telemedicine and the availability of in person appointments. The patient expressed understanding and agreed to proceed.  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    Observations/Objective: Counselor met with [name] via Webex for individual therapy. Counselor assessed mental health symptoms. [name] reported feeling completely overwhelmed, experiencing ongoing anxiety and daily panic attacks, mainly happening when waking from sleep. She reported that she will follow up about her medications because they do not seem to be effective at this time. Counselor processed coping skills she can use in the moment to alleviate the anxiety and ways she can be preventative. Counselor praised [name], Barbara (physical sciences) for her ability to make a decision and take action in regards to her job situation. She too was proud and relieved to get that behind her. Counselor explored trauma history and its current impacts. Counselor encouraged [name] in her goal of tackling a list of tasks she has been neglecting. [name] believes this will help her feelings of being overwhelmed. Counselor summarized session and made a plan for upcoming appointments.   Assessment and Plan: Counselor and [name] will continue to meet to address treatment plan goals in one week. [name] will apply skills and strategies learned in therapy to decrease symptoms.   Follow Up Instructions: Counselor will set up Webex for next session.    I discussed the assessment and treatment plan with the patient. The patient was provided an opportunity to ask  questions and all were answered. The patient agreed with the plan and demonstrated an understanding of the instructions.   The patient was advised to call back or seek an in-person evaluation if the symptoms worsen or if the condition fails to improve as anticipated.  I provided 50 minutes of non-face-to-face time during this encounter.   Barbara Auer, LCSW

## 2019-01-13 DIAGNOSIS — F324 Major depressive disorder, single episode, in partial remission: Secondary | ICD-10-CM | POA: Diagnosis not present

## 2019-01-13 DIAGNOSIS — F4310 Post-traumatic stress disorder, unspecified: Secondary | ICD-10-CM | POA: Diagnosis not present

## 2019-01-13 DIAGNOSIS — R5383 Other fatigue: Secondary | ICD-10-CM | POA: Diagnosis not present

## 2019-01-13 DIAGNOSIS — M2550 Pain in unspecified joint: Secondary | ICD-10-CM | POA: Diagnosis not present

## 2019-01-13 DIAGNOSIS — F419 Anxiety disorder, unspecified: Secondary | ICD-10-CM | POA: Diagnosis not present

## 2019-01-13 DIAGNOSIS — F3131 Bipolar disorder, current episode depressed, mild: Secondary | ICD-10-CM | POA: Diagnosis not present

## 2019-01-17 ENCOUNTER — Other Ambulatory Visit: Payer: Self-pay

## 2019-01-17 ENCOUNTER — Ambulatory Visit (INDEPENDENT_AMBULATORY_CARE_PROVIDER_SITE_OTHER): Payer: 59 | Admitting: Psychiatry

## 2019-01-17 DIAGNOSIS — F410 Panic disorder [episodic paroxysmal anxiety] without agoraphobia: Secondary | ICD-10-CM

## 2019-01-17 DIAGNOSIS — F3181 Bipolar II disorder: Secondary | ICD-10-CM

## 2019-01-17 MED ORDER — PRAZOSIN HCL 2 MG PO CAPS: 2.0000 mg | ORAL_CAPSULE | Freq: Every day | ORAL | 1 refills | Status: DC

## 2019-01-17 MED ORDER — ALPRAZOLAM ER 1 MG PO TB24: 1.0000 mg | ORAL_TABLET | Freq: Two times a day (BID) | ORAL | 1 refills | Status: DC

## 2019-01-17 MED ORDER — QUETIAPINE FUMARATE 50 MG PO TABS: 50.0000 mg | ORAL_TABLET | Freq: Every day | ORAL | 1 refills | Status: DC

## 2019-01-17 NOTE — Progress Notes (Signed)
[place] MD/PA/NP OP Progress Note  01/17/2019 11:20 AM [name]  MRN:  [id number]  Interview was conducted by phone and I verified that I was speaking with the correct person using two identifiers. I discussed the limitations of evaluation and management by telemedicine and  the availability of in person appointments. Patient expressed understanding and agreed to proceed.  Chief Complaint: Panic attacks, difficulty falling asleep.  HPI: 38 yo divorced female who has been in [place] and then in IOP after she started to experience increased depression and multiple daily panic attacks about 2 months ago. 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 Patient is currently on FMLA but is considering not returning to work there and even applying for disability. Patient admits to a hx of recurrent depressive episodes first one as a 38 yo when she had suicidal thoughts. She also reports episode of depression combined with mood fluctuations, insomnia, racing thoughts, increased energy 10 years ago. She has never attempted suicide, never experienced psychotic symptoms. She was never psychiatrically hospitalized. She has no alcohol drug abuse history.  Visit Diagnosis:    ICD-10-CM   1. Panic disorder F41.0   2. Bipolar 2 disorder, major depressive episode ([place]) F31.81     Past Psychiatric History: Please refer to intake H&P.  Past Medical History:  Past Medical History:  Diagnosis Date  . Abscess of left axilla - PREVOTELLA BIVIA & Staph Coag Neg 07/12/2012   MODERATE PREVOTELLA BIVIA Note: BETA LACTAMASE NEGATIVE    . Asthma    as a child  . Cancer ([place])    skin tag rt breast  . Cellulitis 06/01/2014   RT INNER THIGH  . Depression    hx  . Diabetes ([place])   . Diabetes mellitus    IDDM, Insulin pump;  followed by Dr. [name]  . GERD (gastroesophageal reflux disease)    no current meds.  . Heart murmur   . Hidradenitis 04/2012   bilat. thighs, left groin - open areas on thighs  . Hx MRSA infection   . Hydradenitis   . PCOS (polycystic ovarian syndrome)   . Pneumonia    hx  . SVT (supraventricular tachycardia) ([place]) 06/2017  . Vitamin D insufficiency     Past Surgical History:  Procedure Laterality Date  . BREAST SURGERY     right lumpectomy  . BUNIONECTOMY     left  . CARDIAC CATHETERIZATION    . CHOLECYSTECTOMY  12/02/2006   lap. chole.  [facility] DILATION AND EVACUATION  02/14/2007  . ESOPHAGOGASTRODUODENOSCOPY  11/17/2011   Procedure: ESOPHAGOGASTRODUODENOSCOPY (EGD);  Surgeon: [name], MD;  Location: [facility];  Service: Endoscopy;  Laterality: N/A;  . EYE SURGERY     exc. stye left eye  . HYDRADENITIS EXCISION  04/30/2012   Procedure: EXCISION HYDRADENITIS GROIN;  Surgeon: [name], MD;  Location: [facility];  Service: General;  Laterality: Left;  wide excision hidradenitis bilateral thighs and Left groin  . HYDRADENITIS EXCISION Left 01/13/2014   Procedure: WIDE EXCISION HIDRADENITIS LEFT AXILLA;  Surgeon: [name], MD;  Location: [facility];  Service: General;  Laterality: Left;  . IRRIGATION AND DEBRIDEMENT ABSCESS Right 06/02/2014   Procedure: IRRIGATION AND DEBRIDEMENT ABSCESS;  Surgeon: [name], MD;  Location: [facility];  Service:  General;  Laterality: Right;  . IRRIGATION AND DEBRIDEMENT ABSCESS Left 09/23/2014   Procedure: IRRIGATION AND DEBRIDEMENT ABSCESS/LEFT THIGH;  Surgeon: [name], MD;  Location: [facility];  Service: General;  Laterality: Left;  . LEFT HEART CATHETERIZATION WITH CORONARY ANGIOGRAM N/A 07/14/2014   Procedure: LEFT HEART CATHETERIZATION WITH CORONARY ANGIOGRAM;  Surgeon: [name], MD;  Location: [facility] C[facility];  Service: Cardiovascular;  Laterality: N/A;  . TONSILLECTOMY    . WISDOM TOOTH EXTRACTION       Family Psychiatric History: Reviewed.  Family History:  Family History  Problem Relation Age of Onset  . Cancer Mother   . Anxiety disorder Mother   . Depression Mother   . Sexual abuse Mother   . Hyperlipidemia Sister   . Hypertension Sister   . Sexual abuse Sister   . Hypertension Father   . Anxiety disorder Father   . Depression Father   . Drug abuse Father   . Stroke Paternal Uncle   . ADD / ADHD Paternal Uncle   . Anxiety disorder Paternal Uncle   . Anxiety disorder Maternal Aunt   . Seizures Maternal Aunt   . Anxiety disorder Paternal Aunt   . Anxiety disorder Maternal Uncle   . ADD / ADHD Cousin   . ADD / ADHD Daughter     Social History:  Social History   Socioeconomic History  . Marital status: Single    Spouse name: Not on file  . Number of children: Not on file  . Years of education: Not on file  . Highest education level: Not on file  Occupational History  . Not on file  Social Needs  . Financial resource strain: Somewhat hard  . Food insecurity:    Worry: Often true    Inability: Sometimes true  . Transportation needs:    Medical: No    Non-medical: No  Tobacco Use  . Smoking status: Current Every Day Smoker    Packs/day: 1.00    Years: 23.00    Pack years: 23.00    Types: Cigarettes  . Smokeless tobacco: Never Used  . Tobacco comment: Has cut back to 1/2 to 3/4 and wants to begin useing patches again once anxiety subsides  Substance and Sexual Activity  . Alcohol use: No    Alcohol/week: 0.0 standard drinks  . Drug use: Yes    Types: Marijuana    Comment: Last use 3 weeks prior   . Sexual activity: Yes    Partners: Male    Birth control/protection: I.U.D.  Lifestyle  . Physical activity:    Days per week: 0 days    Minutes per session: 0 min  . Stress: Very much  Relationships  . Social connections:    Talks on phone: Twice a week    Gets together: Once a week    Attends religious service: 1 to 4 times per year    Active  member of club or organization: Yes    Attends meetings of clubs or organizations: Not on file    Relationship status: Living with partner  Other Topics Concern  . Not on file  Social History Narrative  . Not on file    Allergies:  Allergies  Allergen Reactions  . Latex Hives, Shortness Of Breath and Rash  . Levofloxacin Shortness Of Breath and Rash  . Moxifloxacin Shortness Of Breath and Rash  . Oxycodone-Acetaminophen Shortness Of Breath, Swelling and Rash    NORCO/VICODIN OK  . Peach [Prunus Persica] Hives and  Shortness Of Breath  . Potassium-Containing Compounds Other (See Comments)    IV ROUTE - CAUSES VEINS TO COLLAPS; Reports that it is undiluted K only  . Prednisone Other (See Comments)    SEVERE ELEVATION OF BLOOD SUGAR. Able to tolerate 40 mg  . Propoxyphene N-Acetaminophen Swelling    SWELLING OF FACE AND THROAT  . Rosiglitazone Maleate Swelling    SWELLING OF FACE AND LEGS  . Xolair [Omalizumab] Other (See Comments)    Rash and anaphylaxis  . Adhesive [Tape] Hives, Itching and Rash  . Morphine And Related Other (See Comments)    Causes hallucinations  . Prozac [Fluoxetine Hcl] Other (See Comments)    Made her very aggressive   . Chantix [Varenicline]     dreams  . Citrullus Vulgaris Nausea And Vomiting    Facial swelling  . Clindamycin/Lincomycin   . Humira [Adalimumab]     Does not remember   . Septra [Sulfamethoxazole-Trimethoprim]   . Cefaclor Rash  . Keflex [Cephalexin] Diarrhea and Rash    REACTION: severe migraine  . Promethazine Hcl Other (See Comments)    IV ROUTE ONLY - JITTERY FEELING. Patient reports that it is mild and she has used promethazine since then  PO tablet ok  . Sulfadiazine Rash    Metabolic Disorder Labs: Lab Results  Component Value Date   HGBA1C 10.6 (H) 10/04/2018   MPG 280.48 12/25/2017   MPG 246 (H) 06/01/2014   No results found for: PROLACTIN Lab Results  Component Value Date   CHOL 193 03/22/2018   TRIG 90.0  03/22/2018   HDL 62.80 03/22/2018   CHOLHDL 3 03/22/2018   VLDL 18.0 03/22/2018   LDLCALC 113 (H) 03/22/2018   LDLCALC 88 12/19/2015   Lab Results  Component Value Date   TSH 1.43 03/22/2018   TSH 0.773 12/25/2017    Therapeutic Level Labs: No results found for: LITHIUM No results found for: VALPROATE No components found for:  CBMZ  Current Medications: Current Outpatient Medications  Medication Sig Dispense Refill  . cyclobenzaprine (FLEXERIL) 5 MG tablet Take 1 tablet (5 mg total) by mouth at bedtime as needed for muscle spasms. 30 tablet 0  . doxycycline (VIBRA-TABS) 100 MG tablet TAKE 1 TABLET BY MOUTH 2 TIMES DAILY. 14 tablet prn  . fluconazole (DIFLUCAN) 150 MG tablet Take 1 tablet (150 mg total) by mouth daily. 7 tablet 3  . folic acid (FOLVITE) 1 MG tablet Take 1 mg by mouth daily.  5  . glucose blood ([employer] TEST STRIPS) test strip 1 strip by Misc.(Non-Drug; Combo Route) route 4 times daily with meals and nightly. contour next glucose test strips    . HUMALOG 100 UNIT/ML injection     . insulin glargine (LANTUS) 100 UNIT/ML injection INJECT 60 UNITS UNDER THE SKIN ONCE DAILY AS DIRECTED 60 mL 3  . Insulin Human (INSULIN PUMP) SOLN Inject into the skin continuous. *Novolog*    . levonorgestrel (MIRENA) 20 MCG/24HR IUD 1 each by Intrauterine route once. Placed 04/2013    . mupirocin ointment (BACTROBAN) 2 % Apply 1 application topically daily as needed.  2  . ondansetron (ZOFRAN) 4 MG tablet 1 tab every 4-8 hours for nausea 10 tablet 5  . pantoprazole (PROTONIX) 40 MG tablet Take 1 tablet (40 mg total) by mouth daily. 90 tablet 4  . prazosin (MINIPRESS) 2 MG capsule Take 1 capsule (2 mg total) by mouth at bedtime for 30 days. 30 capsule 0  . QUEtiapine (SEROQUEL) 50 MG tablet  Take 1 tablet (50 mg total) by mouth at bedtime for 30 days. 30 tablet 0  . spironolactone (ALDACTONE) 100 MG tablet Take 1 tablet (100 mg total) by mouth daily. 30 tablet 3  . varenicline (CHANTIX  PAK) 0.5 MG X 11 & 1 MG X 42 tablet Take one 0.5 mg tablet by mouth once daily for 3 days, then increase to one 0.5 mg tablet twice daily for 4 days, then increase to one 1 mg tablet twice daily. 53 tablet 0  . VIIBRYD 40 MG TABS TAKE 1 TABLET BY MOUTH ONCE DAILY 30 tablet 6   No current facility-administered medications for this visit.     Psychiatric Specialty Exam: Review of Systems  Psychiatric/Behavioral: Positive for depression. The patient is nervous/anxious and has insomnia.   All other systems reviewed and are negative.   There were no vitals taken for this visit.There is no height or weight on file to calculate BMI.  General Appearance: NA  Eye Contact:  NA  Speech:  Clear and Coherent  Volume:  Normal  Mood:  Anxious  Affect:  NA  Thought Process:  Goal Directed  Orientation:  Full (Time, Place, and Person)  Thought Content: Logical   Suicidal Thoughts:  No  Homicidal Thoughts:  No  Memory:  Immediate;   Good Recent;   Good Remote;   Good  Judgement:  Fair  Insight:  Fair  Psychomotor Activity:  NA  Concentration:  Concentration: Good  Recall:  Good  Fund of Knowledge: Good  Language: Good  Akathisia:  NA  Handed:  Right  AIMS (if indicated): not done  Assets:  Communication Skills Desire for Improvement Financial Resources/Insurance Housing Resilience  ADL's:  Intact  Cognition: WNL  Sleep:  Fair   Screenings: GAD-7     Counselor from 12/14/2018 in [facility] Counselor from 11/24/2018 in [facility]  Total GAD-7 Score  17  19    PHQ2-9     Counselor from 12/14/2018 in [facility] Counselor from 11/24/2018 in [facility] Patient Outreach from 03/14/2015 in [facility]  PHQ-2 Total Score  4  6  1   PHQ-9 Total Score  19  24  -       Assessment and Plan: 38 yo divorced female who has been in [place]  and then in IOP after she started to experience increased depression and multiple daily panic attacks about 3 months ago. 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 Patient is currently on FMLA but is considering not returning to work there and even applying for disability. Patient admits to a hx of recurrent depressive episodes first one as a 38 yo when she had suicidal thoughts. She also reports episode of depression combined with mood fluctuations, insomnia, racing thoughts, increased energy 10 years ago. She has never attempted suicide, never experienced psychotic symptoms. She was never psychiatrically hospitalized. She has no alcohol drug abuse history. Good response to Viibryd (less depressed, no SI). Still daily panic attacks - wakes up with one and often has another in PM. Still finds it difficult to fall asleep - takes her few hours after taking Seroquel. Next day she sleeps until 11-12 noon and often wakes up with panic attack.  Dx impression: Bipolar 2 disorder major depressive episode; Panic disorder  Plan: Continue Viibryd - had a good response to it - started in September 2019.- and Seroquel 50 mg (also takes prazosin for nightmares). We will not increase dose of Seroquel as she sleeps up to 12 hours after taking it. It only takes her long time to fall asleep. Instead we will change alprazolam IR to SR 1 mg bid (at bedtime to prevent waking up with panic attack and in AM. [place] will continue individual therapy tomorrow with Lise Auer LCSW. Next follow up visit in early June.  Stephanie Acre, MD 01/17/2019, 11:20 AM

## 2019-01-18 DIAGNOSIS — M2550 Pain in unspecified joint: Secondary | ICD-10-CM | POA: Diagnosis not present

## 2019-01-18 DIAGNOSIS — R5383 Other fatigue: Secondary | ICD-10-CM | POA: Diagnosis not present

## 2019-01-21 ENCOUNTER — Other Ambulatory Visit: Payer: Self-pay

## 2019-01-21 ENCOUNTER — Encounter (HOSPITAL_COMMUNITY): Payer: Self-pay | Admitting: Psychiatry

## 2019-01-21 ENCOUNTER — Ambulatory Visit (INDEPENDENT_AMBULATORY_CARE_PROVIDER_SITE_OTHER): Payer: 59 | Admitting: Psychiatry

## 2019-01-21 DIAGNOSIS — F4312 Post-traumatic stress disorder, chronic: Secondary | ICD-10-CM

## 2019-01-21 DIAGNOSIS — F3181 Bipolar II disorder: Secondary | ICD-10-CM

## 2019-01-21 DIAGNOSIS — F410 Panic disorder [episodic paroxysmal anxiety] without agoraphobia: Secondary | ICD-10-CM

## 2019-01-21 NOTE — Progress Notes (Signed)
Virtual Visit via Video Note  I connected with [name] on 01/21/19 at  9:30 AM EDT by a video enabled telemedicine application and verified that I am speaking with the correct person using two identifiers.  Location: Patient: [name] Provider:  , LCSW   I discussed the limitations of evaluation and management by telemedicine and the availability of in person appointments. The patient expressed understanding and agreed to proceed.  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    Observations/Objective: Counselor met with [name] via Webex for individual therapy. Counselor assessed MH symptoms. [name] reported that she continues to have 3-6 panic attacks a day, that her dreams are becoming more realistic, causing her to wake in a panic. [name] shared about her very difficult experience with attempting to retrieve her things from her job that she just quit. Counselor processed the incident, the panic attacks, dreams and grief and loss she is feeling. Counselor reviewed the skills learned in PHP and IOP to give her more options in coping with the panic attacks and intrusive thoughts. Counselor praised [name] for her ability to work on getting her basic needs in order, adovating for herself, communicating with medical professionals and attempting to use coping strategies. [name] shared that she is taking a vacation and we discussed the importance of self care.   Assessment and Plan: Counselor and [name] will continue to meet to address treatment plan goals. [name] will continue utilization of coping skills and taking medications as prescribed.   Follow Up Instructions: Counselor will send Webex link for next session.    I discussed the assessment and treatment plan with the patient. The patient was provided an opportunity to ask questions and all were answered. The  patient agreed with the plan and demonstrated an understanding of the instructions.   The patient was advised to call back or seek an in-person evaluation if the symptoms worsen or if the condition fails to improve as anticipated.  I provided 35 minutes of non-face-to-face time during this encounter.    , LCSW  

## 2019-01-26 ENCOUNTER — Ambulatory Visit (INDEPENDENT_AMBULATORY_CARE_PROVIDER_SITE_OTHER): Payer: 59 | Admitting: Psychiatry

## 2019-01-26 ENCOUNTER — Encounter (HOSPITAL_COMMUNITY): Payer: Self-pay | Admitting: Psychiatry

## 2019-01-26 ENCOUNTER — Other Ambulatory Visit: Payer: Self-pay

## 2019-01-26 DIAGNOSIS — F3181 Bipolar II disorder: Secondary | ICD-10-CM

## 2019-01-26 DIAGNOSIS — F4312 Post-traumatic stress disorder, chronic: Secondary | ICD-10-CM

## 2019-01-26 DIAGNOSIS — F410 Panic disorder [episodic paroxysmal anxiety] without agoraphobia: Secondary | ICD-10-CM

## 2019-01-26 NOTE — Progress Notes (Signed)
Virtual Visit via Telephone Note  I connected with [name] on 01/26/19 at 11:00 AM EDT by telephone and verified that I am speaking with the correct person using two identifiers.  Location: Patient: [name] Provider: Lise Auer, LCSW   I discussed the limitations, risks, security and privacy concerns of performing an evaluation and management service by telephone and the availability of in person appointments. I also discussed with the patient that there may be a patient responsible charge related to this service. The patient expressed understanding and agreed to proceed.   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    Observations/Objective: Counselor met with [name] via Webex, but there were issues, so we did the majority of the call over the phone. Counselor assessed mental health symptoms and progress on goals. [name] presented as panicked and distressed for the first half of the session. [name] shared that she continues to have multiple panic attacks a day, waking in a panic from vivid and distressing dreams. She is extremely irritable, lashing out at her family members, feeling upset and hopeless at her worsening situation. Counselor explored status on life stressors and issues within her support system. [name] is experiencing increased anxiety due to loss of job, change in insurance, medications being lost in the mail, car breaking down, failing at home schooling her daughter and negative comments from her mother and sister. Counselor reviewed crisis plan, coping strategies, and provided solution focused interventions. Counselor encouraged [name] to contact psychiatrist about evaluating medications, since she continues to have extreme symptoms and lack of sleep.   Assessment and Plan: Counselor recommended that she reach out to Psychiatrist to address  intense symptoms. Counselor will continue meeting with [name] to address treatment plan goals. [name] will implement coping strategies and contact crisis line if needed.   Follow Up Instructions: Counselor will send the next Webex link.    I discussed the assessment and treatment plan with the patient. The patient was provided an opportunity to ask questions and all were answered. The patient agreed with the plan and demonstrated an understanding of the instructions.   The patient was advised to call back or seek an in-person evaluation if the symptoms worsen or if the condition fails to improve as anticipated.  I provided 45 minutes of non-face-to-face time during this encounter.   Lise Auer, LCSW

## 2019-02-02 ENCOUNTER — Other Ambulatory Visit: Payer: Self-pay

## 2019-02-02 ENCOUNTER — Ambulatory Visit (INDEPENDENT_AMBULATORY_CARE_PROVIDER_SITE_OTHER): Payer: 59 | Admitting: Psychiatry

## 2019-02-02 DIAGNOSIS — F4312 Post-traumatic stress disorder, chronic: Secondary | ICD-10-CM

## 2019-02-02 DIAGNOSIS — F410 Panic disorder [episodic paroxysmal anxiety] without agoraphobia: Secondary | ICD-10-CM

## 2019-02-02 DIAGNOSIS — F3181 Bipolar II disorder: Secondary | ICD-10-CM

## 2019-02-03 ENCOUNTER — Encounter (HOSPITAL_COMMUNITY): Payer: Self-pay | Admitting: Psychiatry

## 2019-02-03 NOTE — Progress Notes (Signed)
Virtual Visit via Video Note  I connected with [name] on 02/03/19 at 11:00 AM EDT by a video enabled telemedicine application and verified that I am speaking with the correct person using two identifiers.  Location: Patient: [name] Provider: Lise Auer, LCSW   I discussed the limitations of evaluation and management by telemedicine and the availability of in person appointments. The patient expressed understanding and agreed to proceed.  History of Present Illness: Panic disorder, bipolar disorder, MDD and chronic PTSD due to adverse life experiences, stage of life issues, complex medical conditions and recent loss of job,    Observations/Objective: Social worker met with [name] for individual therapy via Webex. Counselor assessed MH symptoms and progress on treatment plan goals. [name] denied suicidal ideation or self-harm behaviors. However, [name] shared that she continues to have multiple panic attacks in a day, continues to have distressing dreams and wakes in a panic, extreme irritability and anxiety. Counselor examined daily functioning to determine better methods in recovering from her panic attacks. We discussed communicating with psychiatrist about the side effects. Counselor processed how her mental health is impacting her support system. [name] shared implementation of skills and how she's been able to educate her family members on her [place] and needs. Counselor praised her for her intentionality and for how she has been taking care of basic needs as she transitions to being unemployed. Counselor promoted self-care strategies when she is becoming overwhelmed.   Assessment and Plan: Counselor will continue to meet with [name] to address treatment plan goals. Counselor will reach out to Drs to share about med issues. [name] will continue to follow recommendations of providers and implement skills learned in session.  Follow Up Instructions: Counselor will send information for  next session via Webex.    I discussed the assessment and treatment plan with the patient. The patient was provided an opportunity to ask questions and all were answered. The patient agreed with the plan and demonstrated an understanding of the instructions.   The patient was advised to call back or seek an in-person evaluation if the symptoms worsen or if the condition fails to improve as anticipated.  I provided 55 minutes of non-face-to-face time during this encounter.   Lise Auer, LCSW

## 2019-02-06 ENCOUNTER — Telehealth: Payer: Self-pay | Admitting: Internal Medicine

## 2019-02-06 MED ORDER — AMOXICILLIN-POT CLAVULANATE 875-125 MG PO TABS: ORAL_TABLET | ORAL | 3 refills | Status: DC

## 2019-02-06 MED ORDER — HYDROCODONE-ACETAMINOPHEN 5-325 MG PO TABS: 1.0000 | ORAL_TABLET | Freq: Four times a day (QID) | ORAL | 0 refills | Status: AC | PRN

## 2019-02-06 NOTE — Telephone Encounter (Signed)
Having outbreak of hydradenitis sores on arms and buttocks. Painful and inflamed. Asks abx and hydrocodone. Discussed. Plan Augmentin 875 mg, # 20, ref x 3         Hydrocodone 5/325 with caution [facility]

## 2019-02-09 ENCOUNTER — Other Ambulatory Visit: Payer: Self-pay

## 2019-02-09 ENCOUNTER — Encounter (HOSPITAL_COMMUNITY): Payer: Self-pay | Admitting: Psychiatry

## 2019-02-09 ENCOUNTER — Ambulatory Visit (HOSPITAL_COMMUNITY)
Admission: RE | Admit: 2019-02-09 | Discharge: 2019-02-09 | Disposition: A | Discharge status: Home / Self Care | Payer: Medicaid Other | Attending: Psychiatry | Admitting: Psychiatry

## 2019-02-09 ENCOUNTER — Ambulatory Visit (INDEPENDENT_AMBULATORY_CARE_PROVIDER_SITE_OTHER): Payer: 59 | Admitting: Psychiatry

## 2019-02-09 DIAGNOSIS — F3181 Bipolar II disorder: Secondary | ICD-10-CM

## 2019-02-09 DIAGNOSIS — F329 Major depressive disorder, single episode, unspecified: Secondary | ICD-10-CM | POA: Diagnosis not present

## 2019-02-09 DIAGNOSIS — F410 Panic disorder [episodic paroxysmal anxiety] without agoraphobia: Secondary | ICD-10-CM

## 2019-02-09 DIAGNOSIS — F4312 Post-traumatic stress disorder, chronic: Secondary | ICD-10-CM

## 2019-02-09 NOTE — H&P (Signed)
[place] Screening Exam  [name] is an 38 y.o. female patient presents as walk in at [facility] with complaints of worsening depression. Patient states that she seen her therapist to day and was referred to come to [facility] related to not being able to see her psychiatrist because of death in psychiatrist family.  States worsening of depression and feel that she is regressing, not getting out of bed, low energy, not wanting to do anything.  Patient states that she is not sure when her psychiatrist will return but would like to see someone to adjust medications.  Patient denies suicidal/self-harm/homicidal ideation, psychosis, and paranoia.    Total Time spent with patient: 45 minutes  Psychiatric Specialty Exam: Physical Exam  Constitutional: She is oriented to person, place, and time. She appears well-developed and well-nourished.  Neck: Normal range of motion. Neck supple.  Respiratory: Effort normal and breath sounds normal.  Musculoskeletal: Normal range of motion.  Neurological: She is alert and oriented to person, place, and time.  Skin: Skin is warm and dry.  Psychiatric: Her speech is normal and behavior is normal. Judgment and thought content normal. Her mood appears anxious (Stable). Cognition and memory are normal. She exhibits a depressed mood (Stable).    Review of Systems  Psychiatric/Behavioral: Positive for depression (Reporting worsening of depression). Hallucinations: Denies. Suicidal ideas: Denies. Nervous/anxious: Stable. Insomnia: Denies.   All other systems reviewed and are negative.   There were no vitals taken for this visit.There is no height or weight on file to calculate BMI.  General Appearance: Casual  Eye Contact:  Good  Speech:  Clear and Coherent and Normal Rate  Volume:  Normal  Mood:  Depressed  Affect:  Congruent and Depressed  Thought Process:  Coherent and Goal Directed  Orientation:  Full (Time, Place, and Person)  Thought  Content:  WDL  Suicidal Thoughts:  No  Homicidal Thoughts:  No  Memory:  Immediate;   Good Recent;   Good Remote;   Good  Judgement:  Intact  Insight:  Present  Psychomotor Activity:  Normal  Concentration: Concentration: Good and Attention Span: Good  Recall:  Good  Fund of Knowledge:Good  Language: Good  Akathisia:  No  Handed:  Right  AIMS (if indicated):     Assets:  Communication Skills Desire for Improvement Housing Social Support  Sleep:       Musculoskeletal: Strength & Muscle Tone: within normal limits Gait & Station: normal Patient leans: N/A  There were no vitals taken for this visit.  Recommendations:  Follow up with outpatient psychiatric provider.  An appointment was set for patient to see Dr. Adele Schilder tomorrow 02/09/19 at 1:20 pm via webx.  Information given to patient.  Based on my evaluation the patient does not appear to have an emergency medical condition.   Disposition: No evidence of imminent risk to self or others at present.   Patient does not meet criteria for psychiatric inpatient admission. Supportive therapy provided about ongoing stressors. Discussed crisis plan, support from social network, calling 911, coming to the Emergency Department, and calling Suicide Hotline.   , NP 02/09/2019, 3:08 PM
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
## 2019-02-09 NOTE — Progress Notes (Signed)
Virtual Visit via Telephone Note  I connected with [name] on 02/09/19 at 11:00 AM EDT by telephone and verified that I am speaking with the correct person using two identifiers.  Location: Patient: [name] Provider: Lise Auer, LCSW   I discussed the limitations, risks, security and privacy concerns of performing an evaluation and management service by telephone and the availability of in person appointments. I also discussed with the patient that there may be a patient responsible charge related to this service. The patient expressed understanding and agreed to proceed.   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    Observations/Objective: Counselor met with Tajia for individual therapy via Webex. Counselor assessed MH symptoms and progress on treatment plan goals. Merlina denied suicidal ideation or self-harm behaviors. Terence shared that she continues to have panic attacks daily. She is currently sick and on medication for an infection and nausea. She continues to be irritable and short with people. Counselor processed daily functioning, reviewed coping skills and communication skills. Counselor recommends that she be seen at a medical facility to determine underlying medical issues that are affecting her mood. Counselor encouraged her to take medications as prescribed. Counselor explored positive moments and interactions with her to transfer those skills and abilities to harder times. Counselor praised her for the things she is able to accomplish despite her condition.   Assessment and Plan: Counselor will continue to meet with Junia to address treatment plan goals. Stephanny will continue to follow recommendations of providers and implement skills learned in session.  Follow Up Instructions: Counselor will  send information for next session via Webex.     I discussed the assessment and treatment plan with the patient. The patient was provided an opportunity to ask questions and all were answered. The patient agreed with the plan and demonstrated an understanding of the instructions.   The patient was advised to call back or seek an in-person evaluation if the symptoms worsen or if the condition fails to improve as anticipated.  I provided 60 minutes of non-face-to-face time during this encounter.   Lise Auer, LCSW

## 2019-02-10 ENCOUNTER — Ambulatory Visit (INDEPENDENT_AMBULATORY_CARE_PROVIDER_SITE_OTHER): Payer: Medicaid Other | Admitting: Psychiatry

## 2019-02-10 ENCOUNTER — Encounter (HOSPITAL_COMMUNITY): Payer: Self-pay | Admitting: Psychiatry

## 2019-02-10 ENCOUNTER — Other Ambulatory Visit: Payer: Self-pay

## 2019-02-10 DIAGNOSIS — F3181 Bipolar II disorder: Secondary | ICD-10-CM

## 2019-02-10 DIAGNOSIS — F4312 Post-traumatic stress disorder, chronic: Secondary | ICD-10-CM

## 2019-02-10 DIAGNOSIS — F410 Panic disorder [episodic paroxysmal anxiety] without agoraphobia: Secondary | ICD-10-CM

## 2019-02-10 DIAGNOSIS — F332 Major depressive disorder, recurrent severe without psychotic features: Secondary | ICD-10-CM

## 2019-02-10 MED ORDER — QUETIAPINE FUMARATE 50 MG PO TABS: 50.0000 mg | ORAL_TABLET | Freq: Every day | ORAL | 1 refills | Status: DC

## 2019-02-10 MED ORDER — CLONAZEPAM 0.5 MG PO TABS: 0.5000 mg | ORAL_TABLET | Freq: Every day | ORAL | 0 refills | Status: DC | PRN

## 2019-02-10 MED ORDER — PRAZOSIN HCL 2 MG PO CAPS: 2.0000 mg | ORAL_CAPSULE | Freq: Every day | ORAL | 1 refills | Status: DC

## 2019-02-10 MED ORDER — LAMOTRIGINE 25 MG PO TABS: ORAL_TABLET | ORAL | 2 refills | Status: DC

## 2019-02-10 NOTE — Progress Notes (Signed)
Virtual Visit via Telephone Note  I connected with [name] on 02/10/19 at  1:20 PM EDT by telephone and verified that I am speaking with the correct person using two identifiers.   I discussed the limitations, risks, security and privacy concerns of performing an evaluation and management service by telephone and the availability of in person appointments. I also discussed with the patient that there may be a patient responsible charge related to this service. The patient expressed understanding and agreed to proceed.   History of Present Illness: Patient was evaluated by phone session.  Initially she tried to do WebCam but due to poor Internet session was completed by phone only.  Patient is an established patient seen by Dr Montel Culver and during his absence patient requested to be seen by a covering physician.  I review her chart and current symptoms.  Patient is experiencing a lot of anxiety, depression.  She was recently seen in assessment due to worsening of depression and having suicidal thoughts but did not meet criteria for inpatient as she does not have any suicidal plan.  Patient reported fatigue, nightmares, feeling exhausted, no motivation to do things and get easily agitated and irritable.  She also endorses mood swings irritability and get easily snappy.  She denies any hallucination or paranoia but having crying spells, feeling of hopelessness, racing thoughts and poor impulse control.  She is taking Viibryd, Seroquel, Minipress and Xanax as needed for panic attack but lately she feels the medicines are not working.  Patient has done PHP but she has no past psychiatric history of inpatient services.  She has no history of suicidal attempt.  She had a history of cutting herself at age 17 but she has not done since then.  Patient reported multiple health issues.  She has memory problem and in January she was seen in the emergency room to rule out stroke but she was told her MRI is  normal.  She also recall seeing 1 time neurology but no follow-up.  She endorsed lately having issues with poor attention, concentration, easily forgetful and worried about the future.  She had panic attacks and does not leave the house if it is urgent.  She lives with her 36-year-old daughter and her fianc usually comes and stay with them.  Patient admitted a tough relationship with her mother.  Patient feels that she does not support as much.  Patient currently applied for disability and trying to get Medicaid.  She quit her work from [employer] few months ago and currently she has no insurance.  Patient denies drinking or using any illegal substances.  Patient seeing therapist in our office for therapy.  Past psychiatric history; Patient denies any history of suicidal attempt or any inpatient psychiatric treatment.  She is taking Viibryd for more than 9 years after her daughter born.  She also tried Wellbutrin that caused agitation and Prozac which she claims sensitive.  She did PHP 2 months ago and she was given hydroxyzine and trazodone but that did not work.  As mentioned above patient seen Dr. Roland Earl and prescribed Minipress 2 mg, Seroquel 50 mg at bedtime, Viibryd 40 mg and Xanax for panic attacks.      Medical history; Patient has history of SVT, TIA, diabetes, GERD and history of migraine.    Mental status lamination: Mental status examination done on the phone.  Patient described her mood sad, tired, exhausted.  Her speech is slow, soft with decreased volume and tone.  She reported passive and fleeting suicidal thoughts but no plan or any intent.  She denies any auditory or visual hallucination.  She denies any grandiosity or delusions.  She admitted highs and lows and poor impulse control.  At this time there were no flight of ideas.  She is alert and oriented x3.  Her cognition is fair.  Her fund of knowledge is okay.  She reported no tremors, shakes or any EPS.  Her insight and judgment  is okay.  Assessment and Plan: [name] is 38 year old Caucasian female with significant history of depression, underlying mood disorder and panic disorder.  I reviewed her current medication, lab work results, allergies.  Her hemoglobin A1c is 10.6.  We discussed changing her medications since she is not feeling as good.  Due to uncontrolled diabetes we will defer adding Abilify.  We talked about adding low-dose Lamictal to help her irritability and depression.  She like to continue Viibryd since the past she had a good response and she still have 35-month supply of Viibryd.  At this time patient has no insurance.  She agreed to try Lamictal.  We will start Lamictal 25 mg daily for 1 week and then 50 mg daily for another week and then 75 mg for another week and then 100 mg daily.  We discussed medication side effects especially the rash and in that case she need to stop the medication immediately.  I will also discontinue Xanax and try Klonopin 0.5 mg to help her panic attacks.  Recommend to continue Minipress 2 mg which is helping her nightmares and Seroquel 50-100 mg to help her sleep.  Discussed medication side effects in length.  Encouraged to continue therapy for coping skills.  Discussed safety concern that anytime having active suicidal thoughts or homicidal thought then she need to call 911 or go to local emergency room.  Patient like to schedule appointment with Dr. Valeda Malm in 2 to 3 weeks.    Follow Up Instructions:    I discussed the assessment and treatment plan with the patient. The patient was provided an opportunity to ask questions and all were answered. The patient agreed with the plan and demonstrated an understanding of the instructions.   The patient was advised to call back or seek an in-person evaluation if the symptoms worsen or if the condition fails to improve as anticipated.  I provided 40 minutes of non-face-to-face time during this encounter.   Kathlee Nations, MD

## 2019-02-16 ENCOUNTER — Ambulatory Visit (HOSPITAL_COMMUNITY): Payer: 59 | Admitting: Psychiatry

## 2019-02-20 ENCOUNTER — Telehealth: Payer: Self-pay | Admitting: Internal Medicine

## 2019-02-20 MED ORDER — ALPRAZOLAM 0.5 MG PO TABS: 0.5000 mg | ORAL_TABLET | Freq: Three times a day (TID) | ORAL | 0 refills | Status: DC | PRN

## 2019-02-20 NOTE — Telephone Encounter (Signed)
Asks Rx Xanax refill. Reports frequent panic attacks. Her partner going into hospital and her mental health doctor out due to wife's passing. Alprazolam 0.5 mg, # 30, to [employer]

## 2019-02-22 ENCOUNTER — Ambulatory Visit (INDEPENDENT_AMBULATORY_CARE_PROVIDER_SITE_OTHER): Payer: Medicaid Other | Admitting: Psychiatry

## 2019-02-22 ENCOUNTER — Other Ambulatory Visit: Payer: Self-pay

## 2019-02-22 DIAGNOSIS — F3181 Bipolar II disorder: Secondary | ICD-10-CM

## 2019-02-22 DIAGNOSIS — F4312 Post-traumatic stress disorder, chronic: Secondary | ICD-10-CM

## 2019-02-22 DIAGNOSIS — F410 Panic disorder [episodic paroxysmal anxiety] without agoraphobia: Secondary | ICD-10-CM

## 2019-02-22 MED ORDER — CLONAZEPAM 0.5 MG PO TABS: 0.5000 mg | ORAL_TABLET | Freq: Two times a day (BID) | ORAL | 0 refills | Status: DC | PRN

## 2019-02-22 NOTE — Progress Notes (Signed)
[place] MD/PA/NP OP Progress Note  02/22/2019 9:11 AM LAHNA NATH  MRN:  [id number] Interview was conducted by phone and I verified that I was speaking with the correct person using two identifiers. I discussed the limitations of evaluation and management by telemedicine and  the availability of in person appointments. Patient expressed understanding and agreed to proceed.  Chief Complaint: Anxiety, depression, insomnia  HPI: 38 yo divorced female who has been in [place] and then in IOP after she started to experience increased depression and multiple daily panic attacks about 3 months ago. 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 Patient is currently on FMLA but is considering not returning to work there and even applying for disability. Patient admits to a hx of recurrent depressive episodes first one as a 38 yo when she had suicidal thoughts. She also reports episode of depression combined with mood fluctuations, insomnia, racing thoughts, increased energy 10 years ago. She has never attempted suicide, never experienced psychotic symptoms. She was never psychiatrically hospitalized. She has no alcohol drug abuse history. Good response to Viibryd (less depressed, no SI). Still daily panic attacks - wakes up with one and often has another in PM. Still finds it difficult to fall asleep - she has lost insurance and was so far unable to pick up her Rx. She only has Viibryd, Prazosin and left-over trazodone which does not help with sleep. Ziva has seen Dr. Adele Schilder in  [facility] because of increased depression/anxiety. He has added Lamictal titration and prescribed clonazepam instead of Xanax. She plans to pick up Rx later this week.  Visit Diagnosis:    ICD-10-CM   1. Bipolar 2 disorder, major depressive  episode ([place]) F31.81   2. Panic disorder F41.0 clonazePAM (KLONOPIN) 0.5 MG tablet    Past Psychiatric History: Please see intake H&P.  Past Medical History:  Past Medical History:  Diagnosis Date  . Abscess of left axilla - PREVOTELLA BIVIA & Staph Coag Neg 07/12/2012   MODERATE PREVOTELLA BIVIA Note: BETA LACTAMASE NEGATIVE    . Asthma    as a child  . Cancer ([place])    skin tag rt breast  . Cellulitis 06/01/2014   RT INNER THIGH  . Depression    hx  . Diabetes ([place])   . Diabetes mellitus    IDDM, Insulin pump; followed by Dr. [name]  . GERD (gastroesophageal reflux disease)    no current meds.  . Heart murmur   . Hidradenitis 04/2012   bilat. thighs, left groin - open areas on thighs  . Hx MRSA infection   . Hydradenitis   . PCOS (polycystic ovarian syndrome)   . Pneumonia    hx  . SVT (supraventricular tachycardia) ([place]) 06/2017  . Vitamin D insufficiency     Past Surgical History:  Procedure Laterality Date  . BREAST SURGERY     right lumpectomy  . BUNIONECTOMY     left  . CARDIAC CATHETERIZATION    . CHOLECYSTECTOMY  12/02/2006   lap. chole.  Marland [facility] DILATION AND EVACUATION  02/14/2007  . ESOPHAGOGASTRODUODENOSCOPY  11/17/2011   Procedure: ESOPHAGOGASTRODUODENOSCOPY (EGD);  Surgeon: [name], MD;  Location: [facility];  Service: Endoscopy;  Laterality: N/A;  . EYE SURGERY     exc. stye left eye  . HYDRADENITIS  EXCISION  04/30/2012   Procedure: EXCISION HYDRADENITIS GROIN;  Surgeon: [name], MD;  Location: [facility];  Service: General;  Laterality: Left;  wide excision hidradenitis bilateral thighs and Left groin  . HYDRADENITIS EXCISION Left 01/13/2014   Procedure: WIDE EXCISION HIDRADENITIS LEFT AXILLA;  Surgeon: [name], MD;  Location: [facility];  Service: General;  Laterality: Left;  . IRRIGATION AND DEBRIDEMENT ABSCESS Right 06/02/2014   Procedure: IRRIGATION AND DEBRIDEMENT ABSCESS;  Surgeon: [name], MD;   Location: [facility];  Service: General;  Laterality: Right;  . IRRIGATION AND DEBRIDEMENT ABSCESS Left 09/23/2014   Procedure: IRRIGATION AND DEBRIDEMENT ABSCESS/LEFT THIGH;  Surgeon: [name], MD;  Location: [facility];  Service: General;  Laterality: Left;  . LEFT HEART CATHETERIZATION WITH CORONARY ANGIOGRAM N/A 07/14/2014   Procedure: LEFT HEART CATHETERIZATION WITH CORONARY ANGIOGRAM;  Surgeon: [name], MD;  Location: [facility];  Service: Cardiovascular;  Laterality: N/A;  . TONSILLECTOMY    . WISDOM TOOTH EXTRACTION      Family Psychiatric History: Reviewed.  Family History:  Family History  Problem Relation Age of Onset  . Cancer Mother   . Anxiety disorder Mother   . Depression Mother   . Sexual abuse Mother   . Hyperlipidemia Sister   . Hypertension Sister   . Sexual abuse Sister   . Hypertension Father   . Anxiety disorder Father   . Depression Father   . Drug abuse Father   . Stroke Paternal Uncle   . ADD / ADHD Paternal Uncle   . Anxiety disorder Paternal Uncle   . Anxiety disorder Maternal Aunt   . Seizures Maternal Aunt   . Anxiety disorder Paternal Aunt   . Anxiety disorder Maternal Uncle   . ADD / ADHD Cousin   . ADD / ADHD Daughter     Social History:  Social History   Socioeconomic History  . Marital status: Single    Spouse name: Not on file  . Number of children: Not on file  . Years of education: Not on file  . Highest education level: Not on file  Occupational History  . Not on file  Social Needs  . Financial resource strain: Somewhat hard  . Food insecurity:    Worry: Often true    Inability: Sometimes true  . Transportation needs:    Medical: No    Non-medical: No  Tobacco Use  . Smoking status: Current Every Day Smoker    Packs/day: 1.00    Years: 23.00    Pack years: 23.00    Types: Cigarettes  . Smokeless tobacco: Never Used  . Tobacco comment: Has cut back to 1/2 to 3/4 and wants to begin useing patches again once anxiety  subsides  Substance and Sexual Activity  . Alcohol use: No    Alcohol/week: 0.0 standard drinks  . Drug use: Yes    Types: Marijuana    Comment: Last use 3 weeks prior   . Sexual activity: Yes    Partners: Male    Birth control/protection: I.U.D.  Lifestyle  . Physical activity:    Days per week: 0 days    Minutes per session: 0 min  . Stress: Very much  Relationships  . Social connections:    Talks on phone: Twice a week    Gets together: Once a week    Attends religious service: 1 to 4 times per year    Active member of club or organization: Yes  Attends meetings of clubs or organizations: Not on file    Relationship status: Living with partner  Other Topics Concern  . Not on file  Social History Narrative  . Not on file    Allergies:  Allergies  Allergen Reactions  . Latex Hives, Shortness Of Breath and Rash  . Levofloxacin Shortness Of Breath and Rash  . Moxifloxacin Shortness Of Breath and Rash  . Oxycodone-Acetaminophen Shortness Of Breath, Swelling and Rash    NORCO/VICODIN OK  . Peach [Prunus Persica] Hives and Shortness Of Breath  . Potassium-Containing Compounds Other (See Comments)    IV ROUTE - CAUSES VEINS TO COLLAPS; Reports that it is undiluted K only  . Prednisone Other (See Comments)    SEVERE ELEVATION OF BLOOD SUGAR. Able to tolerate 40 mg  . Propoxyphene N-Acetaminophen Swelling    SWELLING OF FACE AND THROAT  . Rosiglitazone Maleate Swelling    SWELLING OF FACE AND LEGS  . Xolair [Omalizumab] Other (See Comments)    Rash and anaphylaxis  . Adhesive [Tape] Hives, Itching and Rash  . Morphine And Related Other (See Comments)    Causes hallucinations  . Prozac [Fluoxetine Hcl] Other (See Comments)    Made her very aggressive   . Chantix [Varenicline]     dreams  . Citrullus Vulgaris Nausea And Vomiting    Facial swelling  . Clindamycin/Lincomycin   . Humira [Adalimumab]     Does not remember   . Septra [Sulfamethoxazole-Trimethoprim]    . Cefaclor Rash  . Keflex [Cephalexin] Diarrhea and Rash    REACTION: severe migraine  . Promethazine Hcl Other (See Comments)    IV ROUTE ONLY - JITTERY FEELING. Patient reports that it is mild and she has used promethazine since then  PO tablet ok  . Sulfadiazine Rash    Metabolic Disorder Labs: Lab Results  Component Value Date   HGBA1C 10.6 (H) 10/04/2018   MPG 280.48 12/25/2017   MPG 246 (H) 06/01/2014   No results found for: PROLACTIN Lab Results  Component Value Date   CHOL 193 03/22/2018   TRIG 90.0 03/22/2018   HDL 62.80 03/22/2018   CHOLHDL 3 03/22/2018   VLDL 18.0 03/22/2018   LDLCALC 113 (H) 03/22/2018   LDLCALC 88 12/19/2015   Lab Results  Component Value Date   TSH 1.43 03/22/2018   TSH 0.773 12/25/2017    Therapeutic Level Labs: No results found for: LITHIUM No results found for: VALPROATE No components found for:  CBMZ  Current Medications: Current Outpatient Medications  Medication Sig Dispense Refill  . ALPRAZolam (XANAX) 0.5 MG tablet Take 1 tablet (0.5 mg total) by mouth 3 (three) times daily as needed for anxiety. 30 tablet 0  . amoxicillin-clavulanate (AUGMENTIN) 875-125 MG tablet 1 tab twice daily 20 tablet 3  . clonazePAM (KLONOPIN) 0.5 MG tablet Take 1 tablet (0.5 mg total) by mouth 2 (two) times daily as needed for up to 30 days for anxiety. 60 tablet 0  . cyclobenzaprine (FLEXERIL) 5 MG tablet Take 1 tablet (5 mg total) by mouth at bedtime as needed for muscle spasms. 30 tablet 0  . doxycycline (VIBRA-TABS) 100 MG tablet TAKE 1 TABLET BY MOUTH 2 TIMES DAILY. 14 tablet prn  . fluconazole (DIFLUCAN) 150 MG tablet Take 1 tablet (150 mg total) by mouth daily. 7 tablet 3  . folic acid (FOLVITE) 1 MG tablet Take 1 mg by mouth daily.  5  . glucose blood ([employer] TEST STRIPS) test strip 1 strip by Misc.(Non-Drug;  Combo Route) route 4 times daily with meals and nightly. contour next glucose test strips    . HUMALOG 100 UNIT/ML injection     .  insulin glargine (LANTUS) 100 UNIT/ML injection INJECT 60 UNITS UNDER THE SKIN ONCE DAILY AS DIRECTED 60 mL 3  . Insulin Human (INSULIN PUMP) SOLN Inject into the skin continuous. *Novolog*    . lamoTRIgine (LAMICTAL) 25 MG tablet Take 1 tablet 25 mg daily for 1 week and then  take 50 mg for another week and then 75 mg daily for another week and then 100 mg daily. 120 tablet 2  . levonorgestrel (MIRENA) 20 MCG/24HR IUD 1 each by Intrauterine route once. Placed 04/2013    . mupirocin ointment (BACTROBAN) 2 % Apply 1 application topically daily as needed.  2  . ondansetron (ZOFRAN) 4 MG tablet 1 tab every 4-8 hours for nausea 10 tablet 5  . pantoprazole (PROTONIX) 40 MG tablet Take 1 tablet (40 mg total) by mouth daily. 90 tablet 4  . prazosin (MINIPRESS) 2 MG capsule Take 1 capsule (2 mg total) by mouth at bedtime. 30 capsule 1  . QUEtiapine (SEROQUEL) 50 MG tablet Take 1-2 tablets (50-100 mg total) by mouth at bedtime. 60 tablet 1  . spironolactone (ALDACTONE) 100 MG tablet Take 1 tablet (100 mg total) by mouth daily. 30 tablet 3  . VIIBRYD 40 MG TABS TAKE 1 TABLET BY MOUTH ONCE DAILY 30 tablet 6   No current facility-administered medications for this visit.     Psychiatric Specialty Exam: Review of Systems  Psychiatric/Behavioral: Positive for depression. The patient is nervous/anxious and has insomnia.   All other systems reviewed and are negative.   There were no vitals taken for this visit.There is no height or weight on file to calculate BMI.  General Appearance: NA  Eye Contact:  NA  Speech:  Clear and Coherent  Volume:  Normal  Mood:  Anxious, Depressed and Irritable  Affect:  NA  Thought Process:  Goal Directed  Orientation:  Full (Time, Place, and Person)  Thought Content: Logical   Suicidal Thoughts:  No  Homicidal Thoughts:  No  Memory:  Immediate;   Good Recent;   Good Remote;   Good  Judgement:  Good  Insight:  Fair  Psychomotor Activity:  NA  Concentration:   Concentration: Fair  Recall:  Good  Fund of Knowledge: Good  Language: Good  Akathisia:  NA  Handed:  Right  AIMS (if indicated): not done  Assets:  Communication Skills Desire for Improvement Housing Resilience  ADL's:  Intact  Cognition: WNL  Sleep:  Poor   Screenings: GAD-7     Counselor from 12/14/2018 in [facility] Counselor from 11/24/2018 in [facility]  Total GAD-7 Score  17  19    PHQ2-9     Counselor from 12/14/2018 in [facility] Counselor from 11/24/2018 in [facility] from 03/14/2015 in [facility] [place]  PHQ-2 Total Score  4  6  1   PHQ-9 Total Score  19  24  -       Assessment and Plan: 38 yo divorced female who has been in [place] and then in IOP after she started to experience increased depression and multiple daily panic attacks about 3 months ago. 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 Patient is currently on FMLA but is considering not returning to work there and even applying for disability. Patient admits to a hx of recurrent depressive episodes first one as a 38 yo when she had suicidal thoughts. She also reports episode of depression combined with mood fluctuations, insomnia, racing thoughts, increased energy 10 years ago. She has never attempted suicide, never experienced psychotic symptoms. She was never psychiatrically hospitalized. She has no alcohol drug abuse history. Good response to Viibryd (less depressed, no SI). Still daily panic attacks - wakes up with one and often has another in PM. Still finds it difficult to fall asleep - she has lost insurance and was so far unable to pick up her Rx.  She only has Viibryd, Prazosin and left-over trazodone which does not help with sleep. Elyana has seen Dr. Adele Schilder in  [facility] because of increased depression/anxiety. He has added Lamictal titration and prescribef clonazepam instead of Xanax. She plans to pick up Rx later this week. Some problems with concentration - in the past dx with ADHD.  Dx impression: Bipolar 2 disorder major depressive episode; Panic disorder  Plan: Continue Viibryd - had a good response to it - and Seroquel 50 mg (also takes prazosin for nightmares). Start Lamictal titration and clonazepam prn anxiety as soon as she has money to buy them (later this week). She reports she will have [employer]. Next visit in 4 weeks.     Stephanie Acre, MD 02/22/2019, 9:11 AM

## 2019-02-22 NOTE — Progress Notes (Signed)
Virtual Visit via Telephone Note  I connected with [name] on 02/22/19 at  3:00 PM EDT by telephone and verified that I am speaking with the correct person using two identifiers.  Location: Patient: [name] Provider: Lise Auer, LCSW   I discussed the limitations, risks, security and privacy concerns of performing an evaluation and management service by telephone and the availability of in person appointments. I also discussed with the patient that there may be a patient responsible charge related to this service. The patient expressed understanding and agreed to proceed.   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    Observations/Objective: Counselor met with Anayia for individual therapy via Webex. Counselor assessed MH symptoms and progress on treatment plan goals. Asuncion denied suicidal ideation or self-harm behaviors. Leatrice shared that her anxiety remains heightened, but that she has noticed she is able to engage in a coping skill to reduce the symptoms more readily. Counselor evaluated which coping skills have been helpful and assessed her daily functioning. Pachia reported that a few stressors have been eliminated from her life and that she has been able to put more supportive measures in place to support her and her family. Counselor and Earnestine discussed the relationship with her sister and how she has used her assertive communication skills to share about her mental health. She reports that they are now becoming closer. Counselor and Raychell processed current medication concerns and issues. Donni shared that she now has enough money to purchase her medications and that she plans to start the this week. Counselor encouraged Evelina continue communicating her needs, utilizing coping skills and implementing self-care.   Assessment and Plan: Counselor will continue to meet with Franshesca to address treatment plan  goals. Tula will continue to follow recommendations of providers and implement skills learned in session.  Follow Up Instructions: Counselor will send information for next session via Webex.     I discussed the assessment and treatment plan with the patient. The patient was provided an opportunity to ask questions and all were answered. The patient agreed with the plan and demonstrated an understanding of the instructions.   The patient was advised to call back or seek an in-person evaluation if the symptoms worsen or if the condition fails to improve as anticipated.  I provided 35 minutes of non-face-to-face time during this encounter.   Lise Auer, LCSW

## 2019-02-23 ENCOUNTER — Ambulatory Visit (HOSPITAL_COMMUNITY): Payer: Medicaid Other | Admitting: Psychiatry

## 2019-02-23 ENCOUNTER — Encounter (HOSPITAL_COMMUNITY): Payer: Self-pay | Admitting: Psychiatry

## 2019-03-07 ENCOUNTER — Ambulatory Visit (INDEPENDENT_AMBULATORY_CARE_PROVIDER_SITE_OTHER): Payer: Medicaid Other | Admitting: Psychiatry

## 2019-03-07 ENCOUNTER — Other Ambulatory Visit: Payer: Self-pay

## 2019-03-07 DIAGNOSIS — F4312 Post-traumatic stress disorder, chronic: Secondary | ICD-10-CM

## 2019-03-07 DIAGNOSIS — F3181 Bipolar II disorder: Secondary | ICD-10-CM

## 2019-03-07 DIAGNOSIS — F410 Panic disorder [episodic paroxysmal anxiety] without agoraphobia: Secondary | ICD-10-CM

## 2019-03-07 NOTE — Progress Notes (Signed)
Virtual Visit via Video Note  I connected with [name] on 03/07/19 at  1:00 PM EDT by a video enabled telemedicine application and verified that I am speaking with the correct person using two identifiers.  Location: Patient: [name] Provider: Lise Auer LCSW   I discussed the limitations of evaluation and management by telemedicine and the availability of in person appointments. The patient expressed understanding and agreed to proceed.  History of Present Illness: Panic Disorder, Bipolar 2 and PTSD   Observations/Objective: Counselor met with [name] for individual therapy via Webex. Counselor assessed MH symptoms and progress on treatment plan goals. [name] denied suicidal ideation or self-harm behaviors. [name] shared that some of her symptoms have shown improvement, she continues waking in a panic from vivid and disturbing dreams. [name] reported that she is having less intense and frequent panic attacks. Counselor processed daily functioning and continued to encourage self-care. Counselor and [name] explored relational issues and concerns she is having with her significant other. Counselor and [name] discussed parenting strategies related to concerns she's having with her daughter. [name] was able to identify a variety of coping skills that she is using and remembers from group treatment. She is optimistic about the direction her treatment is going despite the "curveballs" life is throwing at her.   Assessment and Plan: Counselor will continue to meet with [name] to address treatment plan goals. [name] will continue to follow recommendations of providers and implement skills learned in session.  Follow Up Instructions: Counselor will send information for next session via Webex.      I provided 55 minutes of non-face-to-face time during this encounter.   Lise Auer, LCSW

## 2019-03-09 ENCOUNTER — Encounter (HOSPITAL_COMMUNITY): Payer: Self-pay | Admitting: Psychiatry

## 2019-03-14 ENCOUNTER — Emergency Department (HOSPITAL_COMMUNITY)
Admission: EM | Admit: 2019-03-14 | Discharge: 2019-03-14 | Disposition: A | Discharge status: Home / Self Care | Payer: Medicaid Other | Source: Home / Self Care | Attending: Emergency Medicine | Admitting: Emergency Medicine

## 2019-03-14 ENCOUNTER — Other Ambulatory Visit: Payer: Self-pay

## 2019-03-14 ENCOUNTER — Emergency Department (HOSPITAL_COMMUNITY): Payer: Medicaid Other

## 2019-03-14 DIAGNOSIS — Y999 Unspecified external cause status: Secondary | ICD-10-CM | POA: Insufficient documentation

## 2019-03-14 DIAGNOSIS — S0990XA Unspecified injury of head, initial encounter: Secondary | ICD-10-CM

## 2019-03-14 DIAGNOSIS — S161XXA Strain of muscle, fascia and tendon at neck level, initial encounter: Secondary | ICD-10-CM

## 2019-03-14 DIAGNOSIS — E109 Type 1 diabetes mellitus without complications: Secondary | ICD-10-CM | POA: Insufficient documentation

## 2019-03-14 DIAGNOSIS — J45909 Unspecified asthma, uncomplicated: Secondary | ICD-10-CM | POA: Insufficient documentation

## 2019-03-14 DIAGNOSIS — Y939 Activity, unspecified: Secondary | ICD-10-CM | POA: Insufficient documentation

## 2019-03-14 DIAGNOSIS — F17210 Nicotine dependence, cigarettes, uncomplicated: Secondary | ICD-10-CM | POA: Insufficient documentation

## 2019-03-14 DIAGNOSIS — Z8673 Personal history of transient ischemic attack (TIA), and cerebral infarction without residual deficits: Secondary | ICD-10-CM | POA: Insufficient documentation

## 2019-03-14 DIAGNOSIS — Z794 Long term (current) use of insulin: Secondary | ICD-10-CM | POA: Insufficient documentation

## 2019-03-14 DIAGNOSIS — Z91040 Latex allergy status: Secondary | ICD-10-CM | POA: Insufficient documentation

## 2019-03-14 DIAGNOSIS — S298XXA Other specified injuries of thorax, initial encounter: Secondary | ICD-10-CM

## 2019-03-14 DIAGNOSIS — Y929 Unspecified place or not applicable: Secondary | ICD-10-CM | POA: Insufficient documentation

## 2019-03-14 DIAGNOSIS — Z79899 Other long term (current) drug therapy: Secondary | ICD-10-CM | POA: Insufficient documentation

## 2019-03-14 LAB — COMPREHENSIVE METABOLIC PANEL
ALT: 13 U/L (ref 0–44)
AST: 13 U/L — ABNORMAL LOW (ref 15–41)
Albumin: 3.5 g/dL (ref 3.5–5.0)
Alkaline Phosphatase: 93 U/L (ref 38–126)
Anion gap: 11 (ref 5–15)
BUN: 15 mg/dL (ref 6–20)
CO2: 18 mmol/L — ABNORMAL LOW (ref 22–32)
Calcium: 9.1 mg/dL (ref 8.9–10.3)
Chloride: 106 mmol/L (ref 98–111)
Creatinine, Ser: 0.75 mg/dL (ref 0.44–1.00)
GFR calc Af Amer: >60 mL/min (ref >60)
GFR calc non Af Amer: >60 mL/min (ref >60)
Glucose, Bld: 277 mg/dL — ABNORMAL HIGH (ref 70–99)
Potassium: 3.4 mmol/L — ABNORMAL LOW (ref 3.5–5.1)
Sodium: 135 mmol/L (ref 135–145)
Total Bilirubin: 0.7 mg/dL (ref 0.3–1.2)
Total Protein: 6.4 g/dL — ABNORMAL LOW (ref 6.5–8.1)

## 2019-03-14 LAB — CBC
HCT: 44 % (ref 36.0–46.0)
Hemoglobin: 14.9 g/dL (ref 12.0–15.0)
MCH: 32.5 pg (ref 26.0–34.0)
MCHC: 33.9 g/dL (ref 30.0–36.0)
MCV: 95.9 fL (ref 80.0–100.0)
Platelets: 341 10*3/uL (ref 150–400)
RBC: 4.59 MIL/uL (ref 3.87–5.11)
RDW: 12.4 % (ref 11.5–15.5)
WBC: 10.9 10*3/uL — ABNORMAL HIGH (ref 4.0–10.5)
nRBC: 0 % (ref 0.0–0.2)

## 2019-03-14 LAB — DIFFERENTIAL
Abs Immature Granulocytes: 0.05 10*3/uL (ref 0.00–0.07)
Basophils Absolute: 0.1 10*3/uL (ref 0.0–0.1)
Basophils Relative: 1 %
Eosinophils Absolute: 0.3 10*3/uL (ref 0.0–0.5)
Eosinophils Relative: 3 %
Immature Granulocytes: 1 %
Lymphocytes Relative: 30 %
Lymphs Abs: 3.2 10*3/uL (ref 0.7–4.0)
Monocytes Absolute: 0.7 10*3/uL (ref 0.1–1.0)
Monocytes Relative: 6 %
Neutro Abs: 6.6 10*3/uL (ref 1.7–7.7)
Neutrophils Relative %: 59 %

## 2019-03-14 MED ORDER — HYDROCODONE-ACETAMINOPHEN 5-325 MG PO TABS
2.0000 | ORAL_TABLET | Freq: Once | ORAL | Status: AC
  Administered 2019-03-14: 2 via ORAL
  Filled 2019-03-14: qty 2

## 2019-03-14 MED ORDER — ONDANSETRON 4 MG PO TBDP
8.0000 mg | ORAL_TABLET | Freq: Once | ORAL | Status: AC
  Administered 2019-03-14: 05:00:00 8 mg via ORAL
  Filled 2019-03-14: qty 2
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
## 2019-03-15 ENCOUNTER — Inpatient Hospital Stay (HOSPITAL_COMMUNITY)
Admission: EM | Admit: 2019-03-15 | Discharge: 2019-03-18 | DRG: 638 | Disposition: A | Discharge status: Home Health | Payer: Medicaid Other | Attending: Internal Medicine | Admitting: Internal Medicine

## 2019-03-15 ENCOUNTER — Other Ambulatory Visit: Payer: Self-pay

## 2019-03-15 ENCOUNTER — Encounter (HOSPITAL_COMMUNITY): Payer: Self-pay | Admitting: Emergency Medicine

## 2019-03-15 ENCOUNTER — Telehealth (HOSPITAL_COMMUNITY): Payer: Self-pay | Admitting: Psychiatry

## 2019-03-15 DIAGNOSIS — F17210 Nicotine dependence, cigarettes, uncomplicated: Secondary | ICD-10-CM | POA: Diagnosis present

## 2019-03-15 DIAGNOSIS — I471 Supraventricular tachycardia: Secondary | ICD-10-CM | POA: Diagnosis present

## 2019-03-15 DIAGNOSIS — Z818 Family history of other mental and behavioral disorders: Secondary | ICD-10-CM | POA: Diagnosis not present

## 2019-03-15 DIAGNOSIS — Z888 Allergy status to other drugs, medicaments and biological substances status: Secondary | ICD-10-CM | POA: Diagnosis not present

## 2019-03-15 DIAGNOSIS — Z8701 Personal history of pneumonia (recurrent): Secondary | ICD-10-CM | POA: Diagnosis not present

## 2019-03-15 DIAGNOSIS — L02415 Cutaneous abscess of right lower limb: Secondary | ICD-10-CM | POA: Diagnosis present

## 2019-03-15 DIAGNOSIS — Z794 Long term (current) use of insulin: Secondary | ICD-10-CM | POA: Diagnosis not present

## 2019-03-15 DIAGNOSIS — Z79899 Other long term (current) drug therapy: Secondary | ICD-10-CM | POA: Diagnosis not present

## 2019-03-15 DIAGNOSIS — Z8614 Personal history of Methicillin resistant Staphylococcus aureus infection: Secondary | ICD-10-CM | POA: Diagnosis not present

## 2019-03-15 DIAGNOSIS — Z85828 Personal history of other malignant neoplasm of skin: Secondary | ICD-10-CM

## 2019-03-15 DIAGNOSIS — E282 Polycystic ovarian syndrome: Secondary | ICD-10-CM | POA: Diagnosis present

## 2019-03-15 DIAGNOSIS — Z885 Allergy status to narcotic agent status: Secondary | ICD-10-CM | POA: Diagnosis not present

## 2019-03-15 DIAGNOSIS — F3181 Bipolar II disorder: Secondary | ICD-10-CM | POA: Diagnosis present

## 2019-03-15 DIAGNOSIS — Z1159 Encounter for screening for other viral diseases: Secondary | ICD-10-CM

## 2019-03-15 DIAGNOSIS — E109 Type 1 diabetes mellitus without complications: Secondary | ICD-10-CM | POA: Diagnosis present

## 2019-03-15 DIAGNOSIS — Z823 Family history of stroke: Secondary | ICD-10-CM

## 2019-03-15 DIAGNOSIS — F329 Major depressive disorder, single episode, unspecified: Secondary | ICD-10-CM | POA: Diagnosis present

## 2019-03-15 DIAGNOSIS — L03115 Cellulitis of right lower limb: Secondary | ICD-10-CM | POA: Diagnosis present

## 2019-03-15 DIAGNOSIS — F419 Anxiety disorder, unspecified: Secondary | ICD-10-CM | POA: Diagnosis present

## 2019-03-15 DIAGNOSIS — Z792 Long term (current) use of antibiotics: Secondary | ICD-10-CM | POA: Diagnosis not present

## 2019-03-15 DIAGNOSIS — Z886 Allergy status to analgesic agent status: Secondary | ICD-10-CM

## 2019-03-15 DIAGNOSIS — K219 Gastro-esophageal reflux disease without esophagitis: Secondary | ICD-10-CM | POA: Diagnosis present

## 2019-03-15 DIAGNOSIS — E1010 Type 1 diabetes mellitus with ketoacidosis without coma: Principal | ICD-10-CM | POA: Diagnosis present

## 2019-03-15 DIAGNOSIS — F32A Depression, unspecified: Secondary | ICD-10-CM | POA: Diagnosis present

## 2019-03-15 DIAGNOSIS — Z9641 Presence of insulin pump (external) (internal): Secondary | ICD-10-CM | POA: Diagnosis present

## 2019-03-15 DIAGNOSIS — Z8249 Family history of ischemic heart disease and other diseases of the circulatory system: Secondary | ICD-10-CM

## 2019-03-15 DIAGNOSIS — F410 Panic disorder [episodic paroxysmal anxiety] without agoraphobia: Secondary | ICD-10-CM | POA: Diagnosis present

## 2019-03-15 DIAGNOSIS — Z8673 Personal history of transient ischemic attack (TIA), and cerebral infarction without residual deficits: Secondary | ICD-10-CM

## 2019-03-15 DIAGNOSIS — E1110 Type 2 diabetes mellitus with ketoacidosis without coma: Secondary | ICD-10-CM

## 2019-03-15 LAB — CBC
HCT: 47.6 % — ABNORMAL HIGH (ref 36.0–46.0)
Hemoglobin: 15.5 g/dL — ABNORMAL HIGH (ref 12.0–15.0)
MCH: 32.2 pg (ref 26.0–34.0)
MCHC: 32.6 g/dL (ref 30.0–36.0)
MCV: 99 fL (ref 80.0–100.0)
Platelets: 341 10*3/uL (ref 150–400)
RBC: 4.81 MIL/uL (ref 3.87–5.11)
RDW: 12 % (ref 11.5–15.5)
WBC: 12.7 10*3/uL — ABNORMAL HIGH (ref 4.0–10.5)
nRBC: 0 % (ref 0.0–0.2)

## 2019-03-15 LAB — CBG MONITORING, ED
Glucose-Capillary: 472 mg/dL — ABNORMAL HIGH (ref 70–99)
Glucose-Capillary: 460 mg/dL — ABNORMAL HIGH (ref 70–99)
Glucose-Capillary: 395 mg/dL — ABNORMAL HIGH (ref 70–99)

## 2019-03-15 LAB — BASIC METABOLIC PANEL
Anion gap: 18 — ABNORMAL HIGH (ref 5–15)
Anion gap: 23 — ABNORMAL HIGH (ref 5–15)
BUN: 16 mg/dL (ref 6–20)
BUN: 17 mg/dL (ref 6–20)
CO2: 16 mmol/L — ABNORMAL LOW (ref 22–32)
CO2: 8 mmol/L — ABNORMAL LOW (ref 22–32)
Calcium: 9.1 mg/dL (ref 8.9–10.3)
Calcium: 9 mg/dL (ref 8.9–10.3)
Chloride: 99 mmol/L (ref 98–111)
Chloride: 104 mmol/L (ref 98–111)
Creatinine, Ser: 1.34 mg/dL — ABNORMAL HIGH (ref 0.44–1.00)
Creatinine, Ser: 1.16 mg/dL — ABNORMAL HIGH (ref 0.44–1.00)
GFR calc Af Amer: 59 mL/min — ABNORMAL LOW (ref >60)
GFR calc Af Amer: >60 mL/min (ref >60)
GFR calc non Af Amer: 50 mL/min — ABNORMAL LOW (ref >60)
GFR calc non Af Amer: >60 mL/min (ref >60)
Glucose, Bld: 501 mg/dL (ref 70–99)
Glucose, Bld: 462 mg/dL — ABNORMAL HIGH (ref 70–99)
Potassium: 4.5 mmol/L (ref 3.5–5.1)
Potassium: 5.1 mmol/L (ref 3.5–5.1)
Sodium: 133 mmol/L — ABNORMAL LOW (ref 135–145)
Sodium: 135 mmol/L (ref 135–145)

## 2019-03-15 LAB — I-STAT BETA HCG BLOOD, ED (MC, WL, AP ONLY): I-stat hCG, quantitative: <5 m[IU]/mL (ref <5)

## 2019-03-15 LAB — URINALYSIS, ROUTINE W REFLEX MICROSCOPIC
Bacteria, UA: NONE SEEN
Bilirubin Urine: NEGATIVE
Glucose, UA: >=500 mg/dL — AB
Ketones, ur: 80 mg/dL — AB
Leukocytes,Ua: NEGATIVE
Nitrite: NEGATIVE
Protein, ur: NEGATIVE mg/dL
Specific Gravity, Urine: 1.026 (ref 1.005–1.030)
pH: 5 (ref 5.0–8.0)

## 2019-03-15 LAB — MAGNESIUM: Magnesium: 1.9 mg/dL (ref 1.7–2.4)

## 2019-03-15 MED ORDER — ONDANSETRON HCL 4 MG/2ML IJ SOLN
4.0000 mg | Freq: Once | INTRAMUSCULAR | Status: AC
  Administered 2019-03-15: 4 mg via INTRAVENOUS
  Filled 2019-03-15: qty 2

## 2019-03-15 MED ORDER — INSULIN REGULAR(HUMAN) IN NACL 100-0.9 UT/100ML-% IV SOLN
INTRAVENOUS | Status: DC
  Administered 2019-03-15: 4 [IU]/h via INTRAVENOUS
  Filled 2019-03-15: qty 100

## 2019-03-15 MED ORDER — ENOXAPARIN SODIUM 40 MG/0.4ML ~~LOC~~ SOLN: 40.0000 mg | SUBCUTANEOUS | Status: DC

## 2019-03-15 MED ORDER — FENTANYL CITRATE (PF) 100 MCG/2ML IJ SOLN
100.0000 ug | Freq: Once | INTRAMUSCULAR | Status: AC
  Administered 2019-03-15: 100 ug via INTRAVENOUS
  Filled 2019-03-15: qty 2

## 2019-03-15 MED ORDER — DEXTROSE-NACL 5-0.45 % IV SOLN
INTRAVENOUS | Status: DC
  Administered 2019-03-15: 23:00:00 via INTRAVENOUS

## 2019-03-15 MED ORDER — DEXTROSE-NACL 5-0.45 % IV SOLN: INTRAVENOUS | Status: DC

## 2019-03-15 MED ORDER — INSULIN REGULAR(HUMAN) IN NACL 100-0.9 UT/100ML-% IV SOLN: INTRAVENOUS | Status: DC

## 2019-03-15 MED ORDER — ENOXAPARIN SODIUM 40 MG/0.4ML ~~LOC~~ SOLN
40.0000 mg | Freq: Every day | SUBCUTANEOUS | Status: DC
  Administered 2019-03-17: 40 mg via SUBCUTANEOUS
  Filled 2019-03-15 (×2): qty 0.4

## 2019-03-15 MED ORDER — SODIUM CHLORIDE 0.9 % IV BOLUS
1000.0000 mL | Freq: Once | INTRAVENOUS | Status: AC
  Administered 2019-03-15: 1000 mL via INTRAVENOUS

## 2019-03-15 MED ORDER — METOCLOPRAMIDE HCL 5 MG/ML IJ SOLN
10.0000 mg | Freq: Once | INTRAMUSCULAR | Status: AC
  Administered 2019-03-15: 10 mg via INTRAVENOUS
  Filled 2019-03-15: qty 2

## 2019-03-15 MED ORDER — SODIUM CHLORIDE 0.9 % IV SOLN
INTRAVENOUS | Status: AC
  Administered 2019-03-15: 19:00:00 via INTRAVENOUS

## 2019-03-15 MED ORDER — VANCOMYCIN HCL IN DEXTROSE 1-5 GM/200ML-% IV SOLN
1000.0000 mg | INTRAVENOUS | Status: DC
  Administered 2019-03-16 – 2019-03-17 (×2): 1000 mg via INTRAVENOUS
  Filled 2019-03-15 (×3): qty 200

## 2019-03-15 MED ORDER — SODIUM CHLORIDE 0.9 % IV SOLN: INTRAVENOUS | Status: DC

## 2019-03-15 MED ORDER — INSULIN ASPART 100 UNIT/ML ~~LOC~~ SOLN
6.0000 [IU] | Freq: Once | SUBCUTANEOUS | Status: AC
  Administered 2019-03-15: 6 [IU] via INTRAVENOUS

## 2019-03-15 MED ORDER — SODIUM CHLORIDE 0.9 % IV SOLN
INTRAVENOUS | Status: DC
  Administered 2019-03-15 – 2019-03-18 (×3): via INTRAVENOUS

## 2019-03-15 MED ORDER — ACETAMINOPHEN 650 MG RE SUPP: 650.0000 mg | Freq: Four times a day (QID) | RECTAL | Status: DC | PRN

## 2019-03-15 MED ORDER — LIDOCAINE-EPINEPHRINE (PF) 2 %-1:200000 IJ SOLN
10.0000 mL | Freq: Once | INTRAMUSCULAR | Status: DC
  Filled 2019-03-15: qty 20

## 2019-03-15 MED ORDER — PANTOPRAZOLE SODIUM 40 MG IV SOLR
40.0000 mg | Freq: Once | INTRAVENOUS | Status: AC
  Administered 2019-03-15: 40 mg via INTRAVENOUS
  Filled 2019-03-15: qty 40

## 2019-03-15 MED ORDER — VANCOMYCIN HCL 10 G IV SOLR
2000.0000 mg | Freq: Once | INTRAVENOUS | Status: AC
  Administered 2019-03-15: 2000 mg via INTRAVENOUS
  Filled 2019-03-15: qty 2000

## 2019-03-15 MED ORDER — HYDROCODONE-ACETAMINOPHEN 5-325 MG PO TABS
1.0000 | ORAL_TABLET | Freq: Once | ORAL | Status: AC
  Administered 2019-03-15: 1 via ORAL
  Filled 2019-03-15: qty 1

## 2019-03-15 MED ORDER — ACETAMINOPHEN 325 MG PO TABS
650.0000 mg | ORAL_TABLET | Freq: Four times a day (QID) | ORAL | Status: DC | PRN
  Administered 2019-03-16 – 2019-03-17 (×2): 650 mg via ORAL
  Filled 2019-03-15 (×2): qty 2
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
## 2019-03-15 NOTE — ED Notes (Addendum)
Called lab to cancel trop per PA Donney Rankins

## 2019-03-15 NOTE — ED Notes (Signed)
Admitting provider at bedside.

## 2019-03-15 NOTE — ED Triage Notes (Signed)
Pt also reporting abscess to right posterior thigh that has been worsening in size and pain.

## 2019-03-15 NOTE — ED Triage Notes (Signed)
Pt reporting that she woke up around 4 and felt "not right" states that she believes she may be in DKA, increased thirst and urination, hyperglycemic at home. Hx Type 1DM Also reporting that she was feeling like she was having heart palpitations and lower chest/upper abdominal pain
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
## 2019-03-15 NOTE — Progress Notes (Signed)
Pharmacy Antibiotic Note  [name] is a 38 y.o. female admitted on 03/15/2019 with R thigh abscess with surrounding cellulitis. Declining incision and drainage. Patient reports history of MRSA skin infections, though our records show MSSA infection in 2016. WBC 12.7 and tmax 98.6.  Of note, she is in AKI with Scr 1.34 (b/l ~0.75), hemodynamically stable.  Pharmacy has been consulted for vancomycin dosing.   Plan: Vancomycin 2000mg  x1 loading dose Vancomycin 1000mg  q24hrs Estimated AUC = 522.8 Will follow renal function for necessitation of dose adjustments Monitor culture data, LOT, vanc levels as needed. As clinically improves could consider changing to clindamycin  Temp (24hrs), Avg:98.6 F (37 C), Min:98.6 F (37 C), Max:98.6 F (37 C)  Recent Labs  Lab 03/14/19 0255 03/15/19 1704  WBC 10.9* 12.7*  CREATININE 0.75 1.34*    Estimated Creatinine Clearance: 65.2 mL/min (A) (by C-G formula based on SCr of 1.34 mg/dL (H)).    Allergies  Allergen Reactions  . Latex Hives, Shortness Of Breath and Rash  . Levofloxacin Shortness Of Breath and Rash  . Moxifloxacin Shortness Of Breath and Rash  . Oxycodone-Acetaminophen Shortness Of Breath, Swelling and Rash    NORCO/VICODIN OK  . Peach [Prunus Persica] Hives and Shortness Of Breath  . Potassium-Containing Compounds Other (See Comments)    IV ROUTE - CAUSES VEINS TO COLLAPS; Reports that it is undiluted K only  . Prednisone Other (See Comments)    SEVERE ELEVATION OF BLOOD SUGAR. Able to tolerate 40 mg  . Propoxyphene N-Acetaminophen Swelling    SWELLING OF FACE AND THROAT  . Rosiglitazone Maleate Swelling    SWELLING OF FACE AND LEGS  . Xolair [Omalizumab] Other (See Comments)    Rash and anaphylaxis  . Adhesive [Tape] Hives, Itching and Rash  . Morphine And Related Other (See Comments)    Causes hallucinations  . Prozac [Fluoxetine Hcl] Other (See Comments)    Made her very aggressive   . Chantix [Varenicline]    dreams  . Citrullus Vulgaris Nausea And Vomiting    Facial swelling  . Clindamycin/Lincomycin   . Humira [Adalimumab]     Does not remember   . Septra [Sulfamethoxazole-Trimethoprim]   . Cefaclor Rash  . Keflex [Cephalexin] Diarrhea and Rash    REACTION: severe migraine  . Promethazine Hcl Other (See Comments)    IV ROUTE ONLY - JITTERY FEELING. Patient reports that it is mild and she has used promethazine since then  PO tablet ok  . Sulfadiazine Rash    Antimicrobials this admission: Vanc 6/23 >>>  Thank you for involving pharmacy in this patient's care.  Janae Bridgeman, PharmD PGY1 Pharmacy Resident Phone: [phone] 03/15/2019 7:50 PM

## 2019-03-16 DIAGNOSIS — E1065 Type 1 diabetes mellitus with hyperglycemia: Secondary | ICD-10-CM

## 2019-03-16 DIAGNOSIS — K219 Gastro-esophageal reflux disease without esophagitis: Secondary | ICD-10-CM

## 2019-03-16 DIAGNOSIS — L02415 Cutaneous abscess of right lower limb: Secondary | ICD-10-CM

## 2019-03-16 LAB — RENAL FUNCTION PANEL
Albumin: 2.8 g/dL — ABNORMAL LOW (ref 3.5–5.0)
Anion gap: 6 (ref 5–15)
BUN: 6 mg/dL (ref 6–20)
CO2: 20 mmol/L — ABNORMAL LOW (ref 22–32)
Calcium: 8.3 mg/dL — ABNORMAL LOW (ref 8.9–10.3)
Chloride: 109 mmol/L (ref 98–111)
Creatinine, Ser: 0.68 mg/dL (ref 0.44–1.00)
GFR calc Af Amer: >60 mL/min (ref >60)
GFR calc non Af Amer: >60 mL/min (ref >60)
Glucose, Bld: 188 mg/dL — ABNORMAL HIGH (ref 70–99)
Phosphorus: 2.1 mg/dL — ABNORMAL LOW (ref 2.5–4.6)
Potassium: 3.7 mmol/L (ref 3.5–5.1)
Sodium: 135 mmol/L (ref 135–145)

## 2019-03-16 LAB — BASIC METABOLIC PANEL
Anion gap: 9 (ref 5–15)
Anion gap: 9 (ref 5–15)
BUN: 13 mg/dL (ref 6–20)
BUN: 9 mg/dL (ref 6–20)
CO2: 16 mmol/L — ABNORMAL LOW (ref 22–32)
CO2: 18 mmol/L — ABNORMAL LOW (ref 22–32)
Calcium: 7.8 mg/dL — ABNORMAL LOW (ref 8.9–10.3)
Calcium: 8.1 mg/dL — ABNORMAL LOW (ref 8.9–10.3)
Chloride: 107 mmol/L (ref 98–111)
Chloride: 109 mmol/L (ref 98–111)
Creatinine, Ser: 0.87 mg/dL (ref 0.44–1.00)
Creatinine, Ser: 0.65 mg/dL (ref 0.44–1.00)
GFR calc Af Amer: >60 mL/min (ref >60)
GFR calc Af Amer: >60 mL/min (ref >60)
GFR calc non Af Amer: >60 mL/min (ref >60)
GFR calc non Af Amer: >60 mL/min (ref >60)
Glucose, Bld: 208 mg/dL — ABNORMAL HIGH (ref 70–99)
Glucose, Bld: 124 mg/dL — ABNORMAL HIGH (ref 70–99)
Potassium: 3.9 mmol/L (ref 3.5–5.1)
Potassium: 4 mmol/L (ref 3.5–5.1)
Sodium: 132 mmol/L — ABNORMAL LOW (ref 135–145)
Sodium: 136 mmol/L (ref 135–145)

## 2019-03-16 LAB — CBC
HCT: 39.1 % (ref 36.0–46.0)
Hemoglobin: 12.6 g/dL (ref 12.0–15.0)
MCH: 31.3 pg (ref 26.0–34.0)
MCHC: 32.2 g/dL (ref 30.0–36.0)
MCV: 97.3 fL (ref 80.0–100.0)
Platelets: 282 10*3/uL (ref 150–400)
RBC: 4.02 MIL/uL (ref 3.87–5.11)
RDW: 12.1 % (ref 11.5–15.5)
WBC: 11.3 10*3/uL — ABNORMAL HIGH (ref 4.0–10.5)
nRBC: 0 % (ref 0.0–0.2)

## 2019-03-16 LAB — CBG MONITORING, ED
Glucose-Capillary: 113 mg/dL — ABNORMAL HIGH (ref 70–99)
Glucose-Capillary: 120 mg/dL — ABNORMAL HIGH (ref 70–99)
Glucose-Capillary: 125 mg/dL — ABNORMAL HIGH (ref 70–99)
Glucose-Capillary: 142 mg/dL — ABNORMAL HIGH (ref 70–99)
Glucose-Capillary: 146 mg/dL — ABNORMAL HIGH (ref 70–99)
Glucose-Capillary: 149 mg/dL — ABNORMAL HIGH (ref 70–99)
Glucose-Capillary: 169 mg/dL — ABNORMAL HIGH (ref 70–99)
Glucose-Capillary: 181 mg/dL — ABNORMAL HIGH (ref 70–99)
Glucose-Capillary: 186 mg/dL — ABNORMAL HIGH (ref 70–99)
Glucose-Capillary: 188 mg/dL — ABNORMAL HIGH (ref 70–99)
Glucose-Capillary: 188 mg/dL — ABNORMAL HIGH (ref 70–99)
Glucose-Capillary: 215 mg/dL — ABNORMAL HIGH (ref 70–99)
Glucose-Capillary: 250 mg/dL — ABNORMAL HIGH (ref 70–99)
Glucose-Capillary: 388 mg/dL — ABNORMAL HIGH (ref 70–99)

## 2019-03-16 LAB — HIV ANTIBODY (ROUTINE TESTING W REFLEX): HIV Screen 4th Generation wRfx: NONREACTIVE

## 2019-03-16 LAB — GLUCOSE, CAPILLARY
Glucose-Capillary: 162 mg/dL — ABNORMAL HIGH (ref 70–99)
Glucose-Capillary: 199 mg/dL — ABNORMAL HIGH (ref 70–99)
Glucose-Capillary: 182 mg/dL — ABNORMAL HIGH (ref 70–99)
Glucose-Capillary: 169 mg/dL — ABNORMAL HIGH (ref 70–99)
Glucose-Capillary: 253 mg/dL — ABNORMAL HIGH (ref 70–99)

## 2019-03-16 MED ORDER — VILAZODONE HCL 40 MG PO TABS
40.0000 mg | ORAL_TABLET | Freq: Every day | ORAL | Status: DC
  Administered 2019-03-16 – 2019-03-18 (×3): 40 mg via ORAL
  Filled 2019-03-16 (×5): qty 1

## 2019-03-16 MED ORDER — PANTOPRAZOLE SODIUM 40 MG PO TBEC
40.0000 mg | DELAYED_RELEASE_TABLET | Freq: Every day | ORAL | Status: DC
  Administered 2019-03-16 – 2019-03-18 (×3): 40 mg via ORAL
  Filled 2019-03-16 (×3): qty 1

## 2019-03-16 MED ORDER — SODIUM CHLORIDE 0.9 % IV BOLUS: 1000.0000 mL | Freq: Once | INTRAVENOUS | Status: DC

## 2019-03-16 MED ORDER — FENTANYL CITRATE (PF) 100 MCG/2ML IJ SOLN
100.0000 ug | INTRAMUSCULAR | Status: AC | PRN
  Administered 2019-03-16 (×2): 100 ug via INTRAVENOUS
  Filled 2019-03-16 (×2): qty 2

## 2019-03-16 MED ORDER — LAMOTRIGINE 25 MG PO TABS
25.0000 mg | ORAL_TABLET | Freq: Every day | ORAL | Status: DC
  Administered 2019-03-16 – 2019-03-18 (×3): 25 mg via ORAL
  Filled 2019-03-16 (×3): qty 1

## 2019-03-16 MED ORDER — SPIRONOLACTONE 25 MG PO TABS
100.0000 mg | ORAL_TABLET | Freq: Every day | ORAL | Status: DC
  Administered 2019-03-16 – 2019-03-18 (×3): 100 mg via ORAL
  Filled 2019-03-16 (×3): qty 4

## 2019-03-16 MED ORDER — POTASSIUM CHLORIDE CRYS ER 20 MEQ PO TBCR
20.0000 meq | EXTENDED_RELEASE_TABLET | Freq: Once | ORAL | Status: AC
  Administered 2019-03-16: 20 meq via ORAL
  Filled 2019-03-16: qty 1

## 2019-03-16 MED ORDER — CLONAZEPAM 0.5 MG PO TABS
0.5000 mg | ORAL_TABLET | Freq: Two times a day (BID) | ORAL | Status: DC | PRN
  Administered 2019-03-16: 0.5 mg via ORAL
  Filled 2019-03-16: qty 1

## 2019-03-16 MED ORDER — INSULIN GLARGINE 100 UNIT/ML ~~LOC~~ SOLN
60.0000 [IU] | Freq: Every day | SUBCUTANEOUS | Status: DC
  Filled 2019-03-16: qty 0.6

## 2019-03-16 MED ORDER — HYDROMORPHONE HCL 1 MG/ML IJ SOLN
1.0000 mg | INTRAMUSCULAR | Status: DC | PRN
  Administered 2019-03-16 – 2019-03-18 (×5): 1 mg via INTRAVENOUS
  Filled 2019-03-16 (×5): qty 1

## 2019-03-16 MED ORDER — INSULIN ASPART 100 UNIT/ML ~~LOC~~ SOLN
0.0000 [IU] | Freq: Every day | SUBCUTANEOUS | Status: DC
  Administered 2019-03-16: 3 [IU] via SUBCUTANEOUS

## 2019-03-16 MED ORDER — INSULIN ASPART 100 UNIT/ML ~~LOC~~ SOLN
0.0000 [IU] | Freq: Three times a day (TID) | SUBCUTANEOUS | Status: DC
  Administered 2019-03-18: 2 [IU] via SUBCUTANEOUS

## 2019-03-16 MED ORDER — QUETIAPINE FUMARATE 50 MG PO TABS
50.0000 mg | ORAL_TABLET | Freq: Every day | ORAL | Status: DC
  Administered 2019-03-16: 100 mg via ORAL
  Administered 2019-03-17: 50 mg via ORAL
  Filled 2019-03-16: qty 1
  Filled 2019-03-16: qty 2

## 2019-03-16 MED ORDER — INSULIN GLARGINE 100 UNIT/ML ~~LOC~~ SOLN
60.0000 [IU] | Freq: Every day | SUBCUTANEOUS | Status: DC
  Administered 2019-03-16 – 2019-03-18 (×3): 60 [IU] via SUBCUTANEOUS
  Filled 2019-03-16 (×3): qty 0.6
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
## 2019-03-16 NOTE — ED Notes (Signed)
RN assumed care of pt. Sign and held orders released.

## 2019-03-16 NOTE — ED Notes (Signed)
Pt's CBG result was 125. Informed [place] - Therapist, sports.

## 2019-03-16 NOTE — H&P (View-Only) (Signed)
Reason for Consult:Right leg abscess Referring Physician: [name] is an 38 y.o. female.  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 Nedra Fitzgerald who presented in DKA with a new abscess on the lateral aspect of her right thigh.  This began four days ago and has spread noticeably with cellulitis and an area of fluctuance.  She refused to let the EDP I&D the abscess due to the pain.  She was admitted by [name] and her sugars are under better control.    Past Medical History:  Diagnosis Date  . Abscess of left axilla - PREVOTELLA BIVIA & Staph Coag Neg 07/12/2012   MODERATE PREVOTELLA BIVIA Note: BETA LACTAMASE NEGATIVE    . Asthma    as a child  . Cancer ([place])    skin tag rt breast  . Cellulitis 06/01/2014   RT INNER THIGH  . Depression    hx  . Diabetes ([name])   . Diabetes mellitus    IDDM, Insulin pump; followed by Dr. Delrae Fitzgerald  . GERD (gastroesophageal reflux disease)    no current meds.  . Heart murmur   . Hidradenitis 04/2012   bilat. thighs, left groin - open areas on thighs  . Hx MRSA infection   . Hydradenitis   . PCOS (polycystic ovarian syndrome)   . Pneumonia    hx  . SVT (supraventricular tachycardia) ([name]) 06/2017  . Vitamin D insufficiency     Past Surgical History:  Procedure Laterality Date  . BREAST SURGERY     right lumpectomy  . BUNIONECTOMY     left  . CARDIAC CATHETERIZATION    . CHOLECYSTECTOMY  12/02/2006   lap. chole.  [name]  02/14/2007  . ESOPHAGOGASTRODUODENOSCOPY  11/17/2011   Procedure: ESOPHAGOGASTRODUODENOSCOPY (EGD);  Surgeon: Barbara Ng, MD;  Location: [name];  Service: Endoscopy;  Laterality: N/A;  . EYE SURGERY     exc. stye left eye  . HYDRADENITIS EXCISION  04/30/2012   Procedure: EXCISION HYDRADENITIS GROIN;  Surgeon: Barbara Bowie, MD;  Location: [name];  Service: General;  Laterality: Left;   wide excision hidradenitis bilateral thighs and Left groin  . HYDRADENITIS EXCISION Left 01/13/2014   Procedure: WIDE EXCISION HIDRADENITIS LEFT AXILLA;  Surgeon: Barbara Bowie, MD;  Location: [name];  Service: General;  Laterality: Left;  . IRRIGATION AND DEBRIDEMENT ABSCESS Right 06/02/2014   Procedure: IRRIGATION AND DEBRIDEMENT ABSCESS;  Surgeon: Barbara Keens, MD;  Location: [name];  Service: General;  Laterality: Right;  . IRRIGATION AND DEBRIDEMENT ABSCESS Left 09/23/2014   Procedure: IRRIGATION AND DEBRIDEMENT ABSCESS/LEFT THIGH;  Surgeon: Barbara Skeans, MD;  Location: [name];  Service: General;  Laterality: Left;  . LEFT HEART CATHETERIZATION WITH CORONARY ANGIOGRAM N/A 07/14/2014   Procedure: LEFT HEART CATHETERIZATION WITH CORONARY ANGIOGRAM;  Surgeon: Barbara M Martinique, MD;  Location[facility] CATH LAB;  Service: Cardiovascular;  Laterality: N/A;  . TONSILLECTOMY    . WISDOM TOOTH EXTRACTION      Family History  Problem Relation Age of Onset  . Cancer Mother   . Anxiety disorder Mother   . Depression Mother   . Sexual abuse Mother   . Hyperlipidemia Sister   . Hypertension Sister   . Sexual abuse Sister   . Hypertension Father   . Anxiety disorder Father   . Depression Father   . Drug abuse Father   .  Stroke Paternal Uncle   . ADD / ADHD Paternal Uncle   . Anxiety disorder Paternal Uncle   . Anxiety disorder Maternal Aunt   . Seizures Maternal Aunt   . Anxiety disorder Paternal Aunt   . Anxiety disorder Maternal Uncle   . ADD / ADHD Cousin   . ADD / ADHD Daughter     Social History:  reports that she has been smoking cigarettes. She has a 23.00 pack-year smoking history. She has never used smokeless tobacco. She reports current drug use. Drug: Marijuana. She reports that she does not drink alcohol.  Allergies:  Allergies  Allergen Reactions  . Latex Hives, Shortness Of Breath and Rash  . Levofloxacin Shortness Of Breath and Rash  . Moxifloxacin Shortness Of Breath and  Rash  . Oxycodone-Acetaminophen Shortness Of Breath, Swelling and Rash    NORCO/VICODIN OK  . Peach [Prunus Persica] Hives and Shortness Of Breath  . Potassium-Containing Compounds Other (See Comments)    IV ROUTE - CAUSES VEINS TO COLLAPS; Reports that it is undiluted K only  . Prednisone Other (See Comments)    SEVERE ELEVATION OF BLOOD SUGAR. Able to tolerate 40 mg  . Propoxyphene N-Acetaminophen Swelling    SWELLING OF FACE AND THROAT  . Rosiglitazone Maleate Swelling    SWELLING OF FACE AND LEGS  . Xolair [Omalizumab] Other (See Comments)    Rash and anaphylaxis  . Adhesive [Tape] Hives, Itching and Rash  . Morphine And Related Other (See Comments)    Causes hallucinations  . Prozac [Fluoxetine Hcl] Other (See Comments)    Made her very aggressive   . Chantix [Varenicline]     dreams  . Citrullus Vulgaris Nausea And Vomiting    Facial swelling  . Clindamycin/Lincomycin   . Humira [Adalimumab]     Does not remember   . Septra [Sulfamethoxazole-Trimethoprim]   . Cefaclor Rash  . Keflex [Cephalexin] Diarrhea and Rash    REACTION: severe migraine  . Promethazine Hcl Other (See Comments)    IV ROUTE ONLY - JITTERY FEELING. Patient reports that it is mild and she has used promethazine since then  PO tablet ok  . Sulfadiazine Rash    Medications: Prior to Admission medications   Medication Sig Start Date End Date Taking? Authorizing Provider  ALPRAZolam Duanne Moron) 0.5 MG tablet Take 1 tablet (0.5 mg total) by mouth 3 (three) times daily as needed for anxiety. 02/20/19  Yes Young, Tarri Fuller D, MD  clonazePAM (KLONOPIN) 0.5 MG tablet Take 1 tablet (0.5 mg total) by mouth 2 (two) times daily as needed for up to 30 days for anxiety. 02/22/19 03/24/19 Yes Pucilowski, Olgierd A, MD  doxycycline (VIBRA-TABS) 100 MG tablet TAKE 1 TABLET BY MOUTH 2 TIMES DAILY. Patient taking differently: Take 100 mg by mouth 2 (two) times daily.  01/12/19  Yes Young, Clinton D, MD  HUMALOG 100 UNIT/ML  injection Inject 1-10 Units into the skin See admin instructions. Sliding scale 1-10units on the correction and 1-7units with carbs 07/31/17  Yes [provider]  insulin glargine (LANTUS) 100 UNIT/ML injection INJECT 60 UNITS UNDER THE SKIN ONCE DAILY AS DIRECTED Patient taking differently: Inject 60 Units into the skin daily.  12/26/18  Yes Baird Lyons D, MD  lamoTRIgine (LAMICTAL) 25 MG tablet Take 1 tablet 25 mg daily for 1 week and then  take 50 mg for another week and then 75 mg daily for another week and then 100 mg daily. 02/10/19  Yes Arfeen, Arlyce Harman, MD  levonorgestrel (MIRENA) 20 MCG/24HR IUD 1 each by Intrauterine route once. Placed 04/2013   Yes [provider]  mupirocin ointment (BACTROBAN) 2 % Apply 1 application topically daily as needed. 10/13/17  Yes [provider]  pantoprazole (PROTONIX) 40 MG tablet Take 1 tablet (40 mg total) by mouth daily. 02/18/18  Yes Young, Tarri Fuller D, MD  prazosin (MINIPRESS) 2 MG capsule Take 1 capsule (2 mg total) by mouth at bedtime. 02/10/19 04/11/19 Yes Arfeen, Arlyce Harman, MD  QUEtiapine (SEROQUEL) 50 MG tablet Take 1-2 tablets (50-100 mg total) by mouth at bedtime. 02/10/19 04/11/19 Yes Arfeen, Arlyce Harman, MD  spironolactone (ALDACTONE) 100 MG tablet Take 1 tablet (100 mg total) by mouth daily. 04/13/17  Yes Young, Clinton D, MD  VIIBRYD 40 MG TABS TAKE 1 TABLET BY MOUTH ONCE DAILY Patient taking differently: Take 40 mg by mouth daily.  06/08/17  Yes Young, Tarri Fuller D, MD  glucose blood ([employer] TEST STRIPS) test strip 1 each by Other route See admin instructions.  08/04/16   [provider]     Results for orders placed or performed during the hospital encounter of 03/15/19 (from the past 48 hour(s))  CBG monitoring, ED     Status: Abnormal   Collection Time: 03/15/19  4:53 PM  Result Value Ref Range   Glucose-Capillary 472 (H) 70 - 99 mg/dL  Basic metabolic panel     Status: Abnormal   Collection Time: 03/15/19  5:04 PM   Result Value Ref Range   Sodium 133 (L) 135 - 145 mmol/L   Potassium 4.5 3.5 - 5.1 mmol/L   Chloride 99 98 - 111 mmol/L   CO2 16 (L) 22 - 32 mmol/L   Glucose, Bld 501 (HH) 70 - 99 mg/dL    Comment: CRITICAL RESULT CALLED TO, READ BACK BY AND VERIFIED WITH: Ulyses Southward 1809 03/15/2019 D BRADLEY    BUN 16 6 - 20 mg/dL   Creatinine, Ser 1.34 (H) 0.44 - 1.00 mg/dL   Calcium 9.1 8.9 - 10.3 mg/dL   GFR calc non Af Amer 50 (L) >60 mL/min   GFR calc Af Amer 59 (L) >60 mL/min   Anion gap 18 (H) 5 - 15    Comment: Performed at [facility], [place] [address] [place], [place] [address]  CBC     Status: Abnormal   Collection Time: 03/15/19  5:04 PM  Result Value Ref Range   WBC 12.7 (H) 4.0 - 10.5 K/uL   RBC 4.81 3.87 - 5.11 MIL/uL   Hemoglobin 15.5 (H) 12.0 - 15.0 g/dL   HCT 47.6 (H) 36.0 - 46.0 %   MCV 99.0 80.0 - 100.0 fL   MCH 32.2 26.0 - 34.0 pg   MCHC 32.6 30.0 - 36.0 g/dL   RDW 12.0 11.5 - 15.5 %   Platelets 341 150 - 400 K/uL   nRBC 0.0 0.0 - 0.2 %    Comment: Performed at [facility], [place] [address] [place], [place] [address]  Urinalysis, Routine w reflex microscopic     Status: Abnormal   Collection Time: 03/15/19  5:05 PM  Result Value Ref Range   Color, Urine STRAW (A) YELLOW   APPearance CLEAR CLEAR   Specific Gravity, Urine 1.026 1.005 - 1.030   pH 5.0 5.0 - 8.0   Glucose, UA >=500 (A) NEGATIVE mg/dL   Hgb urine dipstick SMALL (A) NEGATIVE   Bilirubin Urine NEGATIVE NEGATIVE   Ketones, ur 80 (A) NEGATIVE mg/dL  Protein, ur NEGATIVE NEGATIVE mg/dL   Nitrite NEGATIVE NEGATIVE   Leukocytes,Ua NEGATIVE NEGATIVE   RBC / HPF 0-5 0 - 5 RBC/hpf   WBC, UA 0-5 0 - 5 WBC/hpf   Bacteria, UA NONE SEEN NONE SEEN   Squamous Epithelial / LPF 0-5 0 - 5    Comment: Performed at [facility], [place] [address], [place], [place] [address]  I-Stat beta hCG blood, ED     Status: None   Collection Time: 03/15/19  5:12 PM  Result Value Ref Range   I-stat hCG,  quantitative <5.0 <5 mIU/mL   Comment 3            Comment:   GEST. AGE      CONC.  (mIU/mL)   <=1 WEEK        5 - 50     2 WEEKS       50 - 500     3 WEEKS       100 - 10,000     4 WEEKS     1,000 - 30,000        FEMALE AND NON-PREGNANT FEMALE:     LESS THAN 5 mIU/mL   Basic metabolic panel     Status: Abnormal   Collection Time: 03/15/19  6:37 PM  Result Value Ref Range   Sodium 135 135 - 145 mmol/L   Potassium 5.1 3.5 - 5.1 mmol/L   Chloride 104 98 - 111 mmol/L   CO2 8 (L) 22 - 32 mmol/L   Glucose, Bld 462 (H) 70 - 99 mg/dL   BUN 17 6 - 20 mg/dL   Creatinine, Ser 1.16 (H) 0.44 - 1.00 mg/dL   Calcium 9.0 8.9 - 10.3 mg/dL   GFR calc non Af Amer >60 >60 mL/min   GFR calc Af Amer >60 >60 mL/min   Anion gap 23 (H) 5 - 15    Comment: Performed at [facility], [place] [address], [place], [place] [address]  Magnesium     Status: None   Collection Time: 03/15/19  6:37 PM  Result Value Ref Range   Magnesium 1.9 1.7 - 2.4 mg/dL    Comment: Performed at [facility], [place] [address] [place], [place] [address]  CBG monitoring, ED     Status: Abnormal   Collection Time: 03/15/19  6:41 PM  Result Value Ref Range   Glucose-Capillary 460 (H) 70 - 99 mg/dL   Comment 1 Notify RN    Comment 2 Document in Chart   CBG monitoring, ED     Status: Abnormal   Collection Time: 03/15/19  7:47 PM  Result Value Ref Range   Glucose-Capillary 395 (H) 70 - 99 mg/dL   Comment 1 Notify RN    Comment 2 Document in Chart   CBG monitoring, ED     Status: Abnormal   Collection Time: 03/15/19  8:11 PM  Result Value Ref Range   Glucose-Capillary 388 (H) 70 - 99 mg/dL  CBG monitoring, ED     Status: Abnormal   Collection Time: 03/15/19  9:17 PM  Result Value Ref Range   Glucose-Capillary 250 (H) 70 - 99 mg/dL   Comment 1 Notify RN    Comment 2 Document in Chart   CBG monitoring, ED     Status: Abnormal   Collection Time: 03/15/19 10:32 PM  Result Value Ref Range   Glucose-Capillary 188 (H)  70 - 99 mg/dL   Comment 1  Notify RN    Comment 2 Document in Chart   CBG monitoring, ED     Status: Abnormal   Collection Time: 03/16/19 12:25 AM  Result Value Ref Range   Glucose-Capillary 215 (H) 70 - 99 mg/dL  CBG monitoring, ED     Status: Abnormal   Collection Time: 03/16/19  1:27 AM  Result Value Ref Range   Glucose-Capillary 186 (H) 70 - 99 mg/dL  Basic metabolic panel     Status: Abnormal   Collection Time: 03/16/19  1:42 AM  Result Value Ref Range   Sodium 132 (L) 135 - 145 mmol/L   Potassium 3.9 3.5 - 5.1 mmol/L    Comment: DELTA CHECK NOTED   Chloride 107 98 - 111 mmol/L   CO2 16 (L) 22 - 32 mmol/L   Glucose, Bld 208 (H) 70 - 99 mg/dL   BUN 13 6 - 20 mg/dL   Creatinine, Ser 0.87 0.44 - 1.00 mg/dL   Calcium 7.8 (L) 8.9 - 10.3 mg/dL   GFR calc non Af Amer >60 >60 mL/min   GFR calc Af Amer >60 >60 mL/min   Anion gap 9 5 - 15    Comment: Performed at [facility], [place] [address], [place], [place] [address]  CBG monitoring, ED     Status: Abnormal   Collection Time: 03/16/19  2:30 AM  Result Value Ref Range   Glucose-Capillary 169 (H) 70 - 99 mg/dL  HIV antibody (Routine Testing)     Status: None   Collection Time: 03/16/19  2:35 AM  Result Value Ref Range   HIV Screen 4th Generation wRfx Non Reactive Non Reactive    Comment: (NOTE) Performed At: [facility] [place], [place] [id number] Rush Farmer MD VO:[id number]   CBG monitoring, ED     Status: Abnormal   Collection Time: 03/16/19  3:50 AM  Result Value Ref Range   Glucose-Capillary 146 (H) 70 - 99 mg/dL  CBG monitoring, ED     Status: Abnormal   Collection Time: 03/16/19  5:03 AM  Result Value Ref Range   Glucose-Capillary 142 (H) 70 - 99 mg/dL  CBG monitoring, ED     Status: Abnormal   Collection Time: 03/16/19  6:07 AM  Result Value Ref Range   Glucose-Capillary 113 (H) 70 - 99 mg/dL  Basic metabolic panel     Status: Abnormal   Collection Time: 03/16/19  6:45 AM   Result Value Ref Range   Sodium 136 135 - 145 mmol/L   Potassium 4.0 3.5 - 5.1 mmol/L   Chloride 109 98 - 111 mmol/L   CO2 18 (L) 22 - 32 mmol/L   Glucose, Bld 124 (H) 70 - 99 mg/dL   BUN 9 6 - 20 mg/dL   Creatinine, Ser 0.65 0.44 - 1.00 mg/dL   Calcium 8.1 (L) 8.9 - 10.3 mg/dL   GFR calc non Af Amer >60 >60 mL/min   GFR calc Af Amer >60 >60 mL/min   Anion gap 9 5 - 15    Comment: Performed at [facility], [place]. [address], [place] [address]  CBC     Status: Abnormal   Collection Time: 03/16/19  6:45 AM  Result Value Ref Range   WBC 11.3 (H) 4.0 - 10.5 K/uL   RBC 4.02 3.87 - 5.11 MIL/uL   Hemoglobin 12.6 12.0 - 15.0 g/dL   HCT 39.1 36.0 - 46.0 %   MCV 97.3 80.0 - 100.0  fL   MCH 31.3 26.0 - 34.0 pg   MCHC 32.2 30.0 - 36.0 g/dL   RDW 12.1 11.5 - 15.5 %   Platelets 282 150 - 400 K/uL   nRBC 0.0 0.0 - 0.2 %    Comment: Performed at [facility], [place] [address] [place], [place] [address]  CBG monitoring, ED     Status: Abnormal   Collection Time: 03/16/19  7:13 AM  Result Value Ref Range   Glucose-Capillary 120 (H) 70 - 99 mg/dL  CBG monitoring, ED     Status: Abnormal   Collection Time: 03/16/19  8:27 AM  Result Value Ref Range   Glucose-Capillary 125 (H) 70 - 99 mg/dL   Comment 1 Notify RN    Comment 2 Document in Chart   CBG monitoring, ED     Status: Abnormal   Collection Time: 03/16/19  9:40 AM  Result Value Ref Range   Glucose-Capillary 149 (H) 70 - 99 mg/dL  CBG monitoring, ED     Status: Abnormal   Collection Time: 03/16/19 10:46 AM  Result Value Ref Range   Glucose-Capillary 181 (H) 70 - 99 mg/dL  CBG monitoring, ED     Status: Abnormal   Collection Time: 03/16/19 11:56 AM  Result Value Ref Range   Glucose-Capillary 188 (H) 70 - 99 mg/dL  Glucose, capillary     Status: Abnormal   Collection Time: 03/16/19  1:01 PM  Result Value Ref Range   Glucose-Capillary 162 (H) 70 - 99 mg/dL  Glucose, capillary     Status: Abnormal   Collection  Time: 03/16/19  1:41 PM  Result Value Ref Range   Glucose-Capillary 199 (H) 70 - 99 mg/dL  Renal function panel     Status: Abnormal   Collection Time: 03/16/19  2:13 PM  Result Value Ref Range   Sodium 135 135 - 145 mmol/L   Potassium 3.7 3.5 - 5.1 mmol/L   Chloride 109 98 - 111 mmol/L   CO2 20 (L) 22 - 32 mmol/L   Glucose, Bld 188 (H) 70 - 99 mg/dL   BUN 6 6 - 20 mg/dL   Creatinine, Ser 0.68 0.44 - 1.00 mg/dL   Calcium 8.3 (L) 8.9 - 10.3 mg/dL   Phosphorus 2.1 (L) 2.5 - 4.6 mg/dL   Albumin 2.8 (L) 3.5 - 5.0 g/dL   GFR calc non Af Amer >60 >60 mL/min   GFR calc Af Amer >60 >60 mL/min   Anion gap 6 5 - 15    Comment: Performed at [facility], [place]. [address], [place], [place] [address]  Glucose, capillary     Status: Abnormal   Collection Time: 03/16/19  2:43 PM  Result Value Ref Range   Glucose-Capillary 182 (H) 70 - 99 mg/dL  Glucose, capillary     Status: Abnormal   Collection Time: 03/16/19  3:41 PM  Result Value Ref Range   Glucose-Capillary 169 (H) 70 - 99 mg/dL    No results found.  Review of Systems  Constitutional: Negative for weight loss.  HENT: Negative for ear discharge, ear pain, hearing loss and tinnitus.   Eyes: Negative for blurred vision, double vision, photophobia and pain.  Respiratory: Negative for cough, sputum production and shortness of breath.   Cardiovascular: Positive for leg swelling. Negative for chest pain.  Gastrointestinal: Negative for abdominal pain, nausea and vomiting.  Genitourinary: Negative for dysuria, flank pain, frequency and urgency.  Musculoskeletal: Positive for myalgias. Negative for back pain,  falls, joint pain and neck pain.  Neurological: Negative for dizziness, tingling, sensory change, focal weakness, loss of consciousness and headaches.  Endo/Heme/Allergies: Does not bruise/bleed easily.  Psychiatric/Behavioral: Negative for depression, memory loss and substance abuse. The patient is not nervous/anxious.         Blood pressure 109/69, pulse 78, temperature 98.4 F (36.9 C), temperature source Oral, resp. rate 18, height 1' 4.54" (0.42 m), weight 99.9 kg, SpO2 98 %. Physical Exam WDWN in NAD Eyes:  Pupils equal, round; sclera anicteric HENT:  Oral mucosa moist; good dentition  Neck:  No masses palpated, no thyromegaly Lungs:  CTA bilaterally; normal respiratory effort CV:  Regular rate and rhythm; no murmurs; extremities well-perfused with no edema Abd:  +bowel sounds, soft, non-tender, no palpable organomegaly; no palpable hernias Skin:  Warm, dry; no sign of jaundice; lateral mid-thigh - 10 x 12 cm area of erythema with a 3 cm area of thickening and fluctuance - exquisitely tender Psychiatric - alert and oriented x 4; calm mood and affect  Assessment/Plan: Subcutaneous abscess - lateral right thigh with surrounding cellulits  Recs:  IV abx NPO p MN To OR tomorrow morning for I&D of abscess   Maia Petties 03/16/2019, 8:46 PM

## 2019-03-16 NOTE — Progress Notes (Signed)
[facility]land [facility]  PROGRESS NOTE    [name]  GMW:[id number] DOB: [birth year] DOA: 03/15/2019 PCP: Harlan Stains, MD   Brief Narrative:   [name] is a 38 y.o. female with medical history significant of left axillar and other areas skin abscesses,  right inner thigh cellulitis, asthma as a child, active smoker, skin cancer, anxiety, depression, type I diabetes, GERD, history of pneumonia, history of polycystic ovarian syndrome, history of supraventricular tachycardia, vitamin D deficiency who is coming to the emergency department due to not feeling well since yesterday evening with polydipsia, polyuria and hyperglycemia.  She states that she vomited 3 times this morning and has had diarrhea 5-6 times already today.  She mentions that she has been having frequent palpitations and mild lightheadedness.  She has lower chest and upper abdomen discomfort.  She also complains of developing an abscess on her mid right eye posterolateral aspect.  She denies constipation, melena or hematochezia.  No dysuria, frequency or hematuria.  She denies fever, chills, sore throat, rhinorrhea, wheezing, hemoptysis, productive cough, dyspnea, diaphoresis, PND, orthopnea, but states she occasionally gets lower extremity edema.   Assessment & Plan:   Principal Problem:   DKA (diabetic ketoacidosis) ([place]) Active Problems:   Diabetes mellitus type I ([place])   Depression   Esophageal reflux   Abscess of right thigh   DKA/Diabetes mellitus type I     - IV hydration/IV insulin infusion; transition to SQ insulin     - Monitor CBG hourly/BMP every 4 hours.     - Replace electrolytes as needed.     - glucose improved, gap closed; transition to SQ insulin (60u lantus qday, SSI)  Abscess of right thigh     - Declined I&D in the ER.     - Analgesics as needed.     - Continue vancomycin per pharmacy; may consider change to clinda; if she continues to decline I&D, can consider outpt follow up     - Discussed need for I&D  again with her this AM, and she is still not willing at this point; can transition to clinda in AM  Depression     - Continue Seroquel 50-100 mg p.o. bedtime; Viibryd 40 mg p.o. daily.  Esophageal reflux     - Protonix 40 mg IVP x1 given.     - Resume oral PPI today  Paroxysmal SVT (supraventricular tachycardia) ([facility])     - Continue IV hydration.     - Keep electrolytes optimized.   DVT prophylaxis: lovenox Code Status: FULL  Disposition Plan: TBD   Consultants:   None  Procedures:   None  Antimicrobials:  [facility] Kitchen Vanc IV 1g qday    Subjective: "I just don't want that right now."  Objective: Vitals:   03/15/19 2230 03/15/19 2330 03/16/19 0000 03/16/19 0612  BP: 105/67 107/67 106/71 100/69  Pulse: 92 89 85 85  Resp: 19 20 18 13   Temp:      TempSrc:      SpO2: 97% 99% 98% 100%    Intake/Output Summary (Last 24 hours) at 03/16/2019 0754 Last data filed at 03/16/2019 0103 Gross per 24 hour  Intake 8179.23 ml  Output -  Net 8179.23 ml   There were no vitals filed for this visit.  Examination:  General: 38 y.o. female resting in bed in NAD Cardiovascular: RRR, +S1, S2, no m/g/r, equal pulses throughout Respiratory: CTABL, no w/r/r, normal WOB GI: BS+, NDNT, no masses noted, no organomegaly noted MSK: No e/c/c Skin: right  thigh erythema, edema Neuro: A&O x 3, no focal deficits Psyc: Appropriate interaction and affect, calm/cooperative    Data Reviewed: I have personally reviewed following labs and imaging studies.  CBC: Recent Labs  Lab 03/14/19 0255 03/15/19 1704 03/16/19 0645  WBC 10.9* 12.7* 11.3*  NEUTROABS 6.6  --   --   HGB 14.9 15.5* 12.6  HCT 44.0 47.6* 39.1  MCV 95.9 99.0 97.3  PLT 341 341 700   Basic Metabolic Panel: Recent Labs  Lab 03/14/19 0255 03/15/19 1704 03/15/19 1837 03/16/19 0142 03/16/19 0645  NA 135 133* 135 132* 136  K 3.4* 4.5 5.1 3.9 4.0  CL 106 99 104 107 109  CO2 18* 16* 8* 16* 18*  GLUCOSE 277* 501* 462* 208*  124*  BUN 15 16 17 13 9   CREATININE 0.75 1.34* 1.16* 0.87 0.65  CALCIUM 9.1 9.1 9.0 7.8* 8.1*  MG  --   --  1.9  --   --    GFR: Estimated Creatinine Clearance: 109.3 mL/min (by C-G formula based on SCr of 0.65 mg/dL). Liver Function Tests: Recent Labs  Lab 03/14/19 0255  AST 13*  ALT 13  ALKPHOS 93  BILITOT 0.7  PROT 6.4*  ALBUMIN 3.5   No results for input(s): LIPASE, AMYLASE in the last 168 hours. No results for input(s): AMMONIA in the last 168 hours. Coagulation Profile: No results for input(s): INR, PROTIME in the last 168 hours. Cardiac Enzymes: No results for input(s): CKTOTAL, CKMB, CKMBINDEX, TROPONINI in the last 168 hours. BNP (last 3 results) No results for input(s): PROBNP in the last 8760 hours. HbA1C: No results for input(s): HGBA1C in the last 72 hours. CBG: Recent Labs  Lab 03/16/19 0230 03/16/19 0350 03/16/19 0503 03/16/19 0607 03/16/19 0713  GLUCAP 169* 146* 142* 113* 120*   Lipid Profile: No results for input(s): CHOL, HDL, LDLCALC, TRIG, CHOLHDL, LDLDIRECT in the last 72 hours. Thyroid Function Tests: No results for input(s): TSH, T4TOTAL, FREET4, T3FREE, THYROIDAB in the last 72 hours. Anemia Panel: No results for input(s): VITAMINB12, FOLATE, FERRITIN, TIBC, IRON, RETICCTPCT in the last 72 hours. Sepsis Labs: No results for input(s): PROCALCITON, LATICACIDVEN in the last 168 hours.  No results found for this or any previous visit (from the past 240 hour(s)).    Radiology Studies: No results found.   Scheduled Meds: . enoxaparin (LOVENOX) injection  40 mg Subcutaneous Daily  . lidocaine-EPINEPHrine  10 mL Infiltration Once   Continuous Infusions: . sodium chloride Stopped (03/16/19 0103)  . sodium chloride    . dextrose 5 % and 0.45% NaCl 125 mL/hr at 03/15/19 2256  . insulin 1.2 Units/hr (03/16/19 0716)  . sodium chloride    . vancomycin       LOS: 1 day    Time spent: 25 minutes spent in the coordination of care today.     Jonnie Finner, DO [facility] Pager [phone]  If 7PM-7AM, please contact night-coverage [email or web address] Password [facility] 03/16/2019, 7:54 AM

## 2019-03-16 NOTE — ED Notes (Signed)
Checked patient blood sugar it was 188 notified RN of blood sugar patient is restiung with call bell in reach and gave patient a sprite

## 2019-03-16 NOTE — Consult Note (Signed)
Reason for Consult:Right leg abscess Referring Physician: Alpa Salvo is an 38 y.o. female.  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 Nedra Hai who presented in DKA with a new abscess on the lateral aspect of her right thigh.  This began four days ago and has spread noticeably with cellulitis and an area of fluctuance.  She refused to let the EDP I&D the abscess due to the pain.  She was admitted by [facility] and her sugars are under better control.    Past Medical History:  Diagnosis Date  . Abscess of left axilla - PREVOTELLA BIVIA & Staph Coag Neg 07/12/2012   MODERATE PREVOTELLA BIVIA Note: BETA LACTAMASE NEGATIVE    . Asthma    as a child  . Cancer ([facility])    skin tag rt breast  . Cellulitis 06/01/2014   RT INNER THIGH  . Depression    hx  . Diabetes ([facility])   . Diabetes mellitus    IDDM, Insulin pump; followed by Dr. [name]  . GERD (gastroesophageal reflux disease)    no current meds.  . Heart murmur   . Hidradenitis 04/2012   bilat. thighs, left groin - open areas on thighs  . Hx MRSA infection   . Hydradenitis   . PCOS (polycystic ovarian syndrome)   . Pneumonia    hx  . SVT (supraventricular tachycardia) ([place]) 06/2017  . Vitamin D insufficiency     Past Surgical History:  Procedure Laterality Date  . BREAST SURGERY     right lumpectomy  . BUNIONECTOMY     left  . CARDIAC CATHETERIZATION    . CHOLECYSTECTOMY  12/02/2006   lap. chole.  Marland Kitchen DILATION AND EVACUATION  02/14/2007  . ESOPHAGOGASTRODUODENOSCOPY  11/17/2011   Procedure: ESOPHAGOGASTRODUODENOSCOPY (EGD);  Surgeon: [name], MD;  Location: [facility];  Service: Endoscopy;  Laterality: N/A;  . EYE SURGERY     exc. stye left eye  . HYDRADENITIS EXCISION  04/30/2012   Procedure: EXCISION HYDRADENITIS GROIN;  Surgeon: [name], MD;  Location: [facility];  Service: General;  Laterality: Left;   wide excision hidradenitis bilateral thighs and Left groin  . HYDRADENITIS EXCISION Left 01/13/2014   Procedure: WIDE EXCISION HIDRADENITIS LEFT AXILLA;  Surgeon: [name], MD;  Location: [facility];  Service: General;  Laterality: Left;  . IRRIGATION AND DEBRIDEMENT ABSCESS Right 06/02/2014   Procedure: IRRIGATION AND DEBRIDEMENT ABSCESS;  Surgeon: [name], MD;  Location: [facility];  Service: General;  Laterality: Right;  . IRRIGATION AND DEBRIDEMENT ABSCESS Left 09/23/2014   Procedure: IRRIGATION AND DEBRIDEMENT ABSCESS/LEFT THIGH;  Surgeon: [name], MD;  Location: [place];  Service: General;  Laterality: Left;  . LEFT HEART CATHETERIZATION WITH CORONARY ANGIOGRAM N/A 07/14/2014   Procedure: LEFT HEART CATHETERIZATION WITH CORONARY ANGIOGRAM;  Surgeon: [name], MD;  Location: [facility] LAB;  Service: Cardiovascular;  Laterality: N/A;  . TONSILLECTOMY    . WISDOM TOOTH EXTRACTION      Family History  Problem Relation Age of Onset  . Cancer Mother   . Anxiety disorder Mother   . Depression Mother   . Sexual abuse Mother   . Hyperlipidemia Sister   . Hypertension Sister   . Sexual abuse Sister   . Hypertension Father   . Anxiety disorder Father   . Depression Father   . Drug abuse Father   .  Stroke Paternal Uncle   . ADD / ADHD Paternal Uncle   . Anxiety disorder Paternal Uncle   . Anxiety disorder Maternal Aunt   . Seizures Maternal Aunt   . Anxiety disorder Paternal Aunt   . Anxiety disorder Maternal Uncle   . ADD / ADHD Cousin   . ADD / ADHD Daughter     Social History:  reports that she has been smoking cigarettes. She has a 23.00 pack-year smoking history. She has never used smokeless tobacco. She reports current drug use. Drug: Marijuana. She reports that she does not drink alcohol.  Allergies:  Allergies  Allergen Reactions  . Latex Hives, Shortness Of Breath and Rash  . Levofloxacin Shortness Of Breath and Rash  . Moxifloxacin Shortness Of Breath and  Rash  . Oxycodone-Acetaminophen Shortness Of Breath, Swelling and Rash    NORCO/VICODIN OK  . Peach [Prunus Persica] Hives and Shortness Of Breath  . Potassium-Containing Compounds Other (See Comments)    IV ROUTE - CAUSES VEINS TO COLLAPS; Reports that it is undiluted K only  . Prednisone Other (See Comments)    SEVERE ELEVATION OF BLOOD SUGAR. Able to tolerate 40 mg  . Propoxyphene N-Acetaminophen Swelling    SWELLING OF FACE AND THROAT  . Rosiglitazone Maleate Swelling    SWELLING OF FACE AND LEGS  . Xolair [Omalizumab] Other (See Comments)    Rash and anaphylaxis  . Adhesive [Tape] Hives, Itching and Rash  . Morphine And Related Other (See Comments)    Causes hallucinations  . Prozac [Fluoxetine Hcl] Other (See Comments)    Made her very aggressive   . Chantix [Varenicline]     dreams  . Citrullus Vulgaris Nausea And Vomiting    Facial swelling  . Clindamycin/Lincomycin   . Humira [Adalimumab]     Does not remember   . Septra [Sulfamethoxazole-Trimethoprim]   . Cefaclor Rash  . Keflex [Cephalexin] Diarrhea and Rash    REACTION: severe migraine  . Promethazine Hcl Other (See Comments)    IV ROUTE ONLY - JITTERY FEELING. Patient reports that it is mild and she has used promethazine since then  PO tablet ok  . Sulfadiazine Rash    Medications: Prior to Admission medications   Medication Sig Start Date End Date Taking? Authorizing Provider  ALPRAZolam Duanne Moron) 0.5 MG tablet Take 1 tablet (0.5 mg total) by mouth 3 (three) times daily as needed for anxiety. 02/20/19  Yes Young, Tarri Fuller D, MD  clonazePAM (KLONOPIN) 0.5 MG tablet Take 1 tablet (0.5 mg total) by mouth 2 (two) times daily as needed for up to 30 days for anxiety. 02/22/19 03/24/19 Yes Pucilowski, Olgierd A, MD  doxycycline (VIBRA-TABS) 100 MG tablet TAKE 1 TABLET BY MOUTH 2 TIMES DAILY. Patient taking differently: Take 100 mg by mouth 2 (two) times daily.  01/12/19  Yes Young, Clinton D, MD  HUMALOG 100 UNIT/ML  injection Inject 1-10 Units into the skin See admin instructions. Sliding scale 1-10units on the correction and 1-7units with carbs 07/31/17  Yes [provider]  insulin glargine (LANTUS) 100 UNIT/ML injection INJECT 60 UNITS UNDER THE SKIN ONCE DAILY AS DIRECTED Patient taking differently: Inject 60 Units into the skin daily.  12/26/18  Yes Baird Lyons D, MD  lamoTRIgine (LAMICTAL) 25 MG tablet Take 1 tablet 25 mg daily for 1 week and then  take 50 mg for another week and then 75 mg daily for another week and then 100 mg daily. 02/10/19  Yes Arfeen, Arlyce Harman, MD  levonorgestrel (MIRENA) 20 MCG/24HR IUD 1 each by Intrauterine route once. Placed 04/2013   Yes [provider]  mupirocin ointment (BACTROBAN) 2 % Apply 1 application topically daily as needed. 10/13/17  Yes [provider]  pantoprazole (PROTONIX) 40 MG tablet Take 1 tablet (40 mg total) by mouth daily. 02/18/18  Yes Young, Tarri Fuller D, MD  prazosin (MINIPRESS) 2 MG capsule Take 1 capsule (2 mg total) by mouth at bedtime. 02/10/19 04/11/19 Yes Arfeen, Arlyce Harman, MD  QUEtiapine (SEROQUEL) 50 MG tablet Take 1-2 tablets (50-100 mg total) by mouth at bedtime. 02/10/19 04/11/19 Yes Arfeen, Arlyce Harman, MD  spironolactone (ALDACTONE) 100 MG tablet Take 1 tablet (100 mg total) by mouth daily. 04/13/17  Yes Young, Clinton D, MD  VIIBRYD 40 MG TABS TAKE 1 TABLET BY MOUTH ONCE DAILY Patient taking differently: Take 40 mg by mouth daily.  06/08/17  Yes Young, Tarri Fuller D, MD  glucose blood ([employer] TEST STRIPS) test strip 1 each by Other route See admin instructions.  08/04/16   [provider]     Results for orders placed or performed during the hospital encounter of 03/15/19 (from the past 48 hour(s))  CBG monitoring, ED     Status: Abnormal   Collection Time: 03/15/19  4:53 PM  Result Value Ref Range   Glucose-Capillary 472 (H) 70 - 99 mg/dL  Basic metabolic panel     Status: Abnormal   Collection Time: 03/15/19  5:04 PM   Result Value Ref Range   Sodium 133 (L) 135 - 145 mmol/L   Potassium 4.5 3.5 - 5.1 mmol/L   Chloride 99 98 - 111 mmol/L   CO2 16 (L) 22 - 32 mmol/L   Glucose, Bld 501 (HH) 70 - 99 mg/dL    Comment: CRITICAL RESULT CALLED TO, READ BACK BY AND VERIFIED WITH: Ulyses Southward 1809 03/15/2019 D BRADLEY    BUN 16 6 - 20 mg/dL   Creatinine, Ser 1.34 (H) 0.44 - 1.00 mg/dL   Calcium 9.1 8.9 - 10.3 mg/dL   GFR calc non Af Amer 50 (L) >60 mL/min   GFR calc Af Amer 59 (L) >60 mL/min   Anion gap 18 (H) 5 - 15    Comment: Performed at [facility], [place] [address] [place], [place] [address]  CBC     Status: Abnormal   Collection Time: 03/15/19  5:04 PM  Result Value Ref Range   WBC 12.7 (H) 4.0 - 10.5 K/uL   RBC 4.81 3.87 - 5.11 MIL/uL   Hemoglobin 15.5 (H) 12.0 - 15.0 g/dL   HCT 47.6 (H) 36.0 - 46.0 %   MCV 99.0 80.0 - 100.0 fL   MCH 32.2 26.0 - 34.0 pg   MCHC 32.6 30.0 - 36.0 g/dL   RDW 12.0 11.5 - 15.5 %   Platelets 341 150 - 400 K/uL   nRBC 0.0 0.0 - 0.2 %    Comment: Performed at [facility], [place] [address], [place], [place] [address]  Urinalysis, Routine w reflex microscopic     Status: Abnormal   Collection Time: 03/15/19  5:05 PM  Result Value Ref Range   Color, Urine STRAW (A) YELLOW   APPearance CLEAR CLEAR   Specific Gravity, Urine 1.026 1.005 - 1.030   pH 5.0 5.0 - 8.0   Glucose, UA >=500 (A) NEGATIVE mg/dL   Hgb urine dipstick SMALL (A) NEGATIVE   Bilirubin Urine NEGATIVE NEGATIVE   Ketones, ur 80 (A) NEGATIVE mg/dL  Protein, ur NEGATIVE NEGATIVE mg/dL   Nitrite NEGATIVE NEGATIVE   Leukocytes,Ua NEGATIVE NEGATIVE   RBC / HPF 0-5 0 - 5 RBC/hpf   WBC, UA 0-5 0 - 5 WBC/hpf   Bacteria, UA NONE SEEN NONE SEEN   Squamous Epithelial / LPF 0-5 0 - 5    Comment: Performed at [facility], [place] [address], [place], [place] [address]  I-Stat beta hCG blood, ED     Status: None   Collection Time: 03/15/19  5:12 PM  Result Value Ref Range   I-stat hCG,  quantitative <5.0 <5 mIU/mL   Comment 3            Comment:   GEST. AGE      CONC.  (mIU/mL)   <=1 WEEK        5 - 50     2 WEEKS       50 - 500     3 WEEKS       100 - 10,000     4 WEEKS     1,000 - 30,000        FEMALE AND NON-PREGNANT FEMALE:     LESS THAN 5 mIU/mL   Basic metabolic panel     Status: Abnormal   Collection Time: 03/15/19  6:37 PM  Result Value Ref Range   Sodium 135 135 - 145 mmol/L   Potassium 5.1 3.5 - 5.1 mmol/L   Chloride 104 98 - 111 mmol/L   CO2 8 (L) 22 - 32 mmol/L   Glucose, Bld 462 (H) 70 - 99 mg/dL   BUN 17 6 - 20 mg/dL   Creatinine, Ser 1.16 (H) 0.44 - 1.00 mg/dL   Calcium 9.0 8.9 - 10.3 mg/dL   GFR calc non Af Amer >60 >60 mL/min   GFR calc Af Amer >60 >60 mL/min   Anion gap 23 (H) 5 - 15    Comment: Performed at [facility], [place] [address], [place], [place] [address]  Magnesium     Status: None   Collection Time: 03/15/19  6:37 PM  Result Value Ref Range   Magnesium 1.9 1.7 - 2.4 mg/dL    Comment: Performed at [facility], [place] [address], [place], [place] [address]  CBG monitoring, ED     Status: Abnormal   Collection Time: 03/15/19  6:41 PM  Result Value Ref Range   Glucose-Capillary 460 (H) 70 - 99 mg/dL   Comment 1 Notify RN    Comment 2 Document in Chart   CBG monitoring, ED     Status: Abnormal   Collection Time: 03/15/19  7:47 PM  Result Value Ref Range   Glucose-Capillary 395 (H) 70 - 99 mg/dL   Comment 1 Notify RN    Comment 2 Document in Chart   CBG monitoring, ED     Status: Abnormal   Collection Time: 03/15/19  8:11 PM  Result Value Ref Range   Glucose-Capillary 388 (H) 70 - 99 mg/dL  CBG monitoring, ED     Status: Abnormal   Collection Time: 03/15/19  9:17 PM  Result Value Ref Range   Glucose-Capillary 250 (H) 70 - 99 mg/dL   Comment 1 Notify RN    Comment 2 Document in Chart   CBG monitoring, ED     Status: Abnormal   Collection Time: 03/15/19 10:32 PM  Result Value Ref Range   Glucose-Capillary 188 (H)  70 - 99 mg/dL   Comment 1  Notify RN    Comment 2 Document in Chart   CBG monitoring, ED     Status: Abnormal   Collection Time: 03/16/19 12:25 AM  Result Value Ref Range   Glucose-Capillary 215 (H) 70 - 99 mg/dL  CBG monitoring, ED     Status: Abnormal   Collection Time: 03/16/19  1:27 AM  Result Value Ref Range   Glucose-Capillary 186 (H) 70 - 99 mg/dL  Basic metabolic panel     Status: Abnormal   Collection Time: 03/16/19  1:42 AM  Result Value Ref Range   Sodium 132 (L) 135 - 145 mmol/L   Potassium 3.9 3.5 - 5.1 mmol/L    Comment: DELTA CHECK NOTED   Chloride 107 98 - 111 mmol/L   CO2 16 (L) 22 - 32 mmol/L   Glucose, Bld 208 (H) 70 - 99 mg/dL   BUN 13 6 - 20 mg/dL   Creatinine, Ser 0.87 0.44 - 1.00 mg/dL   Calcium 7.8 (L) 8.9 - 10.3 mg/dL   GFR calc non Af Amer >60 >60 mL/min   GFR calc Af Amer >60 >60 mL/min   Anion gap 9 5 - 15    Comment: Performed at [facility], [place] [address], [place], [place] [address]  CBG monitoring, ED     Status: Abnormal   Collection Time: 03/16/19  2:30 AM  Result Value Ref Range   Glucose-Capillary 169 (H) 70 - 99 mg/dL  HIV antibody (Routine Testing)     Status: None   Collection Time: 03/16/19  2:35 AM  Result Value Ref Range   HIV Screen 4th Generation wRfx Non Reactive Non Reactive    Comment: (NOTE) Performed At: [facility] [place], [place] [id number] Rush Farmer MD [id number]   CBG monitoring, ED     Status: Abnormal   Collection Time: 03/16/19  3:50 AM  Result Value Ref Range   Glucose-Capillary 146 (H) 70 - 99 mg/dL  CBG monitoring, ED     Status: Abnormal   Collection Time: 03/16/19  5:03 AM  Result Value Ref Range   Glucose-Capillary 142 (H) 70 - 99 mg/dL  CBG monitoring, ED     Status: Abnormal   Collection Time: 03/16/19  6:07 AM  Result Value Ref Range   Glucose-Capillary 113 (H) 70 - 99 mg/dL  Basic metabolic panel     Status: Abnormal   Collection Time: 03/16/19  6:45 AM   Result Value Ref Range   Sodium 136 135 - 145 mmol/L   Potassium 4.0 3.5 - 5.1 mmol/L   Chloride 109 98 - 111 mmol/L   CO2 18 (L) 22 - 32 mmol/L   Glucose, Bld 124 (H) 70 - 99 mg/dL   BUN 9 6 - 20 mg/dL   Creatinine, Ser 0.65 0.44 - 1.00 mg/dL   Calcium 8.1 (L) 8.9 - 10.3 mg/dL   GFR calc non Af Amer >60 >60 mL/min   GFR calc Af Amer >60 >60 mL/min   Anion gap 9 5 - 15    Comment: Performed at [facility], [place]. [address], [place] [address]  CBC     Status: Abnormal   Collection Time: 03/16/19  6:45 AM  Result Value Ref Range   WBC 11.3 (H) 4.0 - 10.5 K/uL   RBC 4.02 3.87 - 5.11 MIL/uL   Hemoglobin 12.6 12.0 - 15.0 g/dL   HCT 39.1 36.0 - 46.0 %   MCV 97.3 80.0 - 100.0  fL   MCH 31.3 26.0 - 34.0 pg   MCHC 32.2 30.0 - 36.0 g/dL   RDW 12.1 11.5 - 15.5 %   Platelets 282 150 - 400 K/uL   nRBC 0.0 0.0 - 0.2 %    Comment: Performed at [facility], [place] [address], [place], [place] [address]  CBG monitoring, ED     Status: Abnormal   Collection Time: 03/16/19  7:13 AM  Result Value Ref Range   Glucose-Capillary 120 (H) 70 - 99 mg/dL  CBG monitoring, ED     Status: Abnormal   Collection Time: 03/16/19  8:27 AM  Result Value Ref Range   Glucose-Capillary 125 (H) 70 - 99 mg/dL   Comment 1 Notify RN    Comment 2 Document in Chart   CBG monitoring, ED     Status: Abnormal   Collection Time: 03/16/19  9:40 AM  Result Value Ref Range   Glucose-Capillary 149 (H) 70 - 99 mg/dL  CBG monitoring, ED     Status: Abnormal   Collection Time: 03/16/19 10:46 AM  Result Value Ref Range   Glucose-Capillary 181 (H) 70 - 99 mg/dL  CBG monitoring, ED     Status: Abnormal   Collection Time: 03/16/19 11:56 AM  Result Value Ref Range   Glucose-Capillary 188 (H) 70 - 99 mg/dL  Glucose, capillary     Status: Abnormal   Collection Time: 03/16/19  1:01 PM  Result Value Ref Range   Glucose-Capillary 162 (H) 70 - 99 mg/dL  Glucose, capillary     Status: Abnormal   Collection  Time: 03/16/19  1:41 PM  Result Value Ref Range   Glucose-Capillary 199 (H) 70 - 99 mg/dL  Renal function panel     Status: Abnormal   Collection Time: 03/16/19  2:13 PM  Result Value Ref Range   Sodium 135 135 - 145 mmol/L   Potassium 3.7 3.5 - 5.1 mmol/L   Chloride 109 98 - 111 mmol/L   CO2 20 (L) 22 - 32 mmol/L   Glucose, Bld 188 (H) 70 - 99 mg/dL   BUN 6 6 - 20 mg/dL   Creatinine, Ser 0.68 0.44 - 1.00 mg/dL   Calcium 8.3 (L) 8.9 - 10.3 mg/dL   Phosphorus 2.1 (L) 2.5 - 4.6 mg/dL   Albumin 2.8 (L) 3.5 - 5.0 g/dL   GFR calc non Af Amer >60 >60 mL/min   GFR calc Af Amer >60 >60 mL/min   Anion gap 6 5 - 15    Comment: Performed at [facility], [place]. [address] [place], [place] [address]  Glucose, capillary     Status: Abnormal   Collection Time: 03/16/19  2:43 PM  Result Value Ref Range   Glucose-Capillary 182 (H) 70 - 99 mg/dL  Glucose, capillary     Status: Abnormal   Collection Time: 03/16/19  3:41 PM  Result Value Ref Range   Glucose-Capillary 169 (H) 70 - 99 mg/dL    No results found.  Review of Systems  Constitutional: Negative for weight loss.  HENT: Negative for ear discharge, ear pain, hearing loss and tinnitus.   Eyes: Negative for blurred vision, double vision, photophobia and pain.  Respiratory: Negative for cough, sputum production and shortness of breath.   Cardiovascular: Positive for leg swelling. Negative for chest pain.  Gastrointestinal: Negative for abdominal pain, nausea and vomiting.  Genitourinary: Negative for dysuria, flank pain, frequency and urgency.  Musculoskeletal: Positive for myalgias. Negative for back pain,  falls, joint pain and neck pain.  Neurological: Negative for dizziness, tingling, sensory change, focal weakness, loss of consciousness and headaches.  Endo/Heme/Allergies: Does not bruise/bleed easily.  Psychiatric/Behavioral: Negative for depression, memory loss and substance abuse. The patient is not nervous/anxious.         Blood pressure 109/69, pulse 78, temperature 98.4 F (36.9 C), temperature source Oral, resp. rate 18, height 1' 4.54" (0.42 m), weight 99.9 kg, SpO2 98 %. Physical Exam WDWN in NAD Eyes:  Pupils equal, round; sclera anicteric HENT:  Oral mucosa moist; good dentition  Neck:  No masses palpated, no thyromegaly Lungs:  CTA bilaterally; normal respiratory effort CV:  Regular rate and rhythm; no murmurs; extremities well-perfused with no edema Abd:  +bowel sounds, soft, non-tender, no palpable organomegaly; no palpable hernias Skin:  Warm, dry; no sign of jaundice; lateral mid-thigh - 10 x 12 cm area of erythema with a 3 cm area of thickening and fluctuance - exquisitely tender Psychiatric - alert and oriented x 4; calm mood and affect  Assessment/Plan: Subcutaneous abscess - lateral right thigh with surrounding cellulits  Recs:  IV abx NPO p MN To OR tomorrow morning for I&D of abscess   Maia Petties 03/16/2019, 8:46 PM

## 2019-03-16 NOTE — ED Notes (Signed)
ED TO INPATIENT HANDOFF REPORT  ED Nurse Name and Phone #:   S Name/Age/Gender [name] 38 y.o. female Room/Bed: 014C/014C  Code Status   Code Status: Full Code   Triage Complete: Triage complete  Chief Complaint R thigh rash, DKA, SHOB, palpitatinos, nausea, vomiting  Triage Note Pt reporting that she woke up around 4 and felt "not right" states that she believes she may be in DKA, increased thirst and urination, hyperglycemic at home. Hx Type 1DM Also reporting that she was feeling like she was having heart palpitations and lower chest/upper abdominal pain  Pt also reporting abscess to right posterior thigh that has been worsening in size and pain.    Allergies Allergies  Allergen Reactions  . Latex Hives, Shortness Of Breath and Rash  . Levofloxacin Shortness Of Breath and Rash  . Moxifloxacin Shortness Of Breath and Rash  . Oxycodone-Acetaminophen Shortness Of Breath, Swelling and Rash    NORCO/VICODIN OK  . Peach [Prunus Persica] Hives and Shortness Of Breath  . Potassium-Containing Compounds Other (See Comments)    IV ROUTE - CAUSES VEINS TO COLLAPS; Reports that it is undiluted K only  . Prednisone Other (See Comments)    SEVERE ELEVATION OF BLOOD SUGAR. Able to tolerate 40 mg  . Propoxyphene N-Acetaminophen Swelling    SWELLING OF FACE AND THROAT  . Rosiglitazone Maleate Swelling    SWELLING OF FACE AND LEGS  . Xolair [Omalizumab] Other (See Comments)    Rash and anaphylaxis  . Adhesive [Tape] Hives, Itching and Rash  . Morphine And Related Other (See Comments)    Causes hallucinations  . Prozac [Fluoxetine Hcl] Other (See Comments)    Made her very aggressive   . Chantix [Varenicline]     dreams  . Citrullus Vulgaris Nausea And Vomiting    Facial swelling  . Clindamycin/Lincomycin   . Humira [Adalimumab]     Does not remember   . Septra [Sulfamethoxazole-Trimethoprim]   . Cefaclor Rash  . Keflex [Cephalexin] Diarrhea and Rash    REACTION:  severe migraine  . Promethazine Hcl Other (See Comments)    IV ROUTE ONLY - JITTERY FEELING. Patient reports that it is mild and she has used promethazine since then  PO tablet ok  . Sulfadiazine Rash    Level of Care/Admitting Diagnosis ED Disposition    ED Disposition Condition [facility]: [place] [[id number]]  Level of Care: Progressive [102]  Covid Evaluation: Screening Protocol (No Symptoms)  Diagnosis: DKA (diabetic ketoacidosis) [facility]) [932671]  Admitting Physician: Reubin Milan [[id number]]  Attending Physician: Reubin Milan [[id number]]  Estimated length of stay: past midnight tomorrow  Certification:: I certify this patient is being admitted for an inpatient-only procedure  PT Class (Do Not Modify): Inpatient [101]  PT Acc Code (Do Not Modify): Private [1]       B Medical/Surgery History Past Medical History:  Diagnosis Date  . Abscess of left axilla - PREVOTELLA BIVIA & Staph Coag Neg 07/12/2012   MODERATE PREVOTELLA BIVIA Note: BETA LACTAMASE NEGATIVE    . Asthma    as a child  . Cancer ([place])    skin tag rt breast  . Cellulitis 06/01/2014   RT INNER THIGH  . Depression    hx  . Diabetes ([place])   . Diabetes mellitus    IDDM, Insulin pump; followed by Dr. [name]  . GERD (gastroesophageal reflux disease)    no current meds.  Marland Kitchen  Heart murmur   . Hidradenitis 04/2012   bilat. thighs, left groin - open areas on thighs  . Hx MRSA infection   . Hydradenitis   . PCOS (polycystic ovarian syndrome)   . Pneumonia    hx  . SVT (supraventricular tachycardia) ([place]) 06/2017  . Vitamin D insufficiency    Past Surgical History:  Procedure Laterality Date  . BREAST SURGERY     right lumpectomy  . BUNIONECTOMY     left  . CARDIAC CATHETERIZATION    . CHOLECYSTECTOMY  12/02/2006   lap. chole.  Marland Kitchen DILATION AND EVACUATION  02/14/2007  . ESOPHAGOGASTRODUODENOSCOPY  11/17/2011   Procedure: ESOPHAGOGASTRODUODENOSCOPY  (EGD);  Surgeon: [name], MD;  Location: [facility];  Service: Endoscopy;  Laterality: N/A;  . EYE SURGERY     exc. stye left eye  . HYDRADENITIS EXCISION  04/30/2012   Procedure: EXCISION HYDRADENITIS GROIN;  Surgeon: [name], MD;  Location: [place];  Service: General;  Laterality: Left;  wide excision hidradenitis bilateral thighs and Left groin  . HYDRADENITIS EXCISION Left 01/13/2014   Procedure: WIDE EXCISION HIDRADENITIS LEFT AXILLA;  Surgeon: [name], MD;  Location: [facility];  Service: General;  Laterality: Left;  . IRRIGATION AND DEBRIDEMENT ABSCESS Right 06/02/2014   Procedure: IRRIGATION AND DEBRIDEMENT ABSCESS;  Surgeon: [name], MD;  Location: [place];  Service: General;  Laterality: Right;  . IRRIGATION AND DEBRIDEMENT ABSCESS Left 09/23/2014   Procedure: IRRIGATION AND DEBRIDEMENT ABSCESS/LEFT THIGH;  Surgeon: [name], MD;  Location: [place];  Service: General;  Laterality: Left;  . LEFT HEART CATHETERIZATION WITH CORONARY ANGIOGRAM N/A 07/14/2014   Procedure: LEFT HEART CATHETERIZATION WITH CORONARY ANGIOGRAM;  Surgeon: [name], MD;  Location: [facility];  Service: Cardiovascular;  Laterality: N/A;  . TONSILLECTOMY    . WISDOM TOOTH EXTRACTION       A IV Location/Drains/Wounds Patient Lines/Drains/Airways Status   Active Line/Drains/Airways    Name:   Placement date:   Placement time:   Site:   Days:   Peripheral IV 03/15/19 Right Antecubital   03/15/19    1707    Antecubital   1   Peripheral IV 03/15/19 Right Forearm   03/15/19    2027    Forearm   1          Intake/Output Last 24 hours  Intake/Output Summary (Last 24 hours) at 03/16/2019 1225 Last data filed at 03/16/2019 0103 Gross per 24 hour  Intake 8179.23 ml  Output -  Net 8179.23 ml    Labs/Imaging Results for orders placed or performed during the hospital encounter of 03/15/19 (from the past 48 hour(s))  CBG monitoring, ED     Status:  Abnormal   Collection Time: 03/15/19  4:53 PM  Result Value Ref Range   Glucose-Capillary 472 (H) 70 - 99 mg/dL  Basic metabolic panel     Status: Abnormal   Collection Time: 03/15/19  5:04 PM  Result Value Ref Range   Sodium 133 (L) 135 - 145 mmol/L   Potassium 4.5 3.5 - 5.1 mmol/L   Chloride 99 98 - 111 mmol/L   CO2 16 (L) 22 - 32 mmol/L   Glucose, Bld 501 (HH) 70 - 99 mg/dL    Comment: CRITICAL RESULT CALLED TO, READ BACK BY AND VERIFIED WITH: Ulyses Southward 1809 03/15/2019 D [place]    BUN 16 6 - 20 mg/dL   Creatinine, Ser 1.34 (H) 0.44 - 1.00 mg/dL  Calcium 9.1 8.9 - 10.3 mg/dL   GFR calc non Af Amer 50 (L) >60 mL/min   GFR calc Af Amer 59 (L) >60 mL/min   Anion gap 18 (H) 5 - 15    Comment: Performed at [facility] [address] [place], [place] [address]  CBC     Status: Abnormal   Collection Time: 03/15/19  5:04 PM  Result Value Ref Range   WBC 12.7 (H) 4.0 - 10.5 K/uL   RBC 4.81 3.87 - 5.11 MIL/uL   Hemoglobin 15.5 (H) 12.0 - 15.0 g/dL   HCT 47.6 (H) 36.0 - 46.0 %   MCV 99.0 80.0 - 100.0 fL   MCH 32.2 26.0 - 34.0 pg   MCHC 32.6 30.0 - 36.0 g/dL   RDW 12.0 11.5 - 15.5 %   Platelets 341 150 - 400 K/uL   nRBC 0.0 0.0 - 0.2 %    Comment: Performed at [facility], [place] [address], [place], [place] [address]  Urinalysis, Routine w reflex microscopic     Status: Abnormal   Collection Time: 03/15/19  5:05 PM  Result Value Ref Range   Color, Urine STRAW (A) YELLOW   APPearance CLEAR CLEAR   Specific Gravity, Urine 1.026 1.005 - 1.030   pH 5.0 5.0 - 8.0   Glucose, UA >=500 (A) NEGATIVE mg/dL   Hgb urine dipstick SMALL (A) NEGATIVE   Bilirubin Urine NEGATIVE NEGATIVE   Ketones, ur 80 (A) NEGATIVE mg/dL   Protein, ur NEGATIVE NEGATIVE mg/dL   Nitrite NEGATIVE NEGATIVE   Leukocytes,Ua NEGATIVE NEGATIVE   RBC / HPF 0-5 0 - 5 RBC/hpf   WBC, UA 0-5 0 - 5 WBC/hpf   Bacteria, UA NONE SEEN NONE SEEN   Squamous Epithelial / LPF 0-5 0 - 5    Comment: Performed  at [facility], [place] [address], [place],  [address]  I-Stat beta hCG blood, ED     Status: None   Collection Time: 03/15/19  5:12 PM  Result Value Ref Range   I-stat hCG, quantitative <5.0 <5 mIU/mL   Comment 3            Comment:   GEST. AGE      CONC.  (mIU/mL)   <=1 WEEK        5 - 50     2 WEEKS       50 - 500     3 WEEKS       100 - 10,000     4 WEEKS     1,000 - 30,000        FEMALE AND NON-PREGNANT FEMALE:     LESS THAN 5 mIU/mL   Basic metabolic panel     Status: Abnormal   Collection Time: 03/15/19  6:37 PM  Result Value Ref Range   Sodium 135 135 - 145 mmol/L   Potassium 5.1 3.5 - 5.1 mmol/L   Chloride 104 98 - 111 mmol/L   CO2 8 (L) 22 - 32 mmol/L   Glucose, Bld 462 (H) 70 - 99 mg/dL   BUN 17 6 - 20 mg/dL   Creatinine, Ser 1.16 (H) 0.44 - 1.00 mg/dL   Calcium 9.0 8.9 - 10.3 mg/dL   GFR calc non Af Amer >60 >60 mL/min   GFR calc Af Amer >60 >60 mL/min   Anion gap 23 (H) 5 - 15    Comment: Performed at [facility], [place]  [address] [place], [place] [address]  Magnesium     Status: None   Collection Time: 03/15/19  6:37 PM  Result Value Ref Range   Magnesium 1.9 1.7 - 2.4 mg/dL    Comment: Performed at [facility], [place] [address], [place], [place] [address]  CBG monitoring, ED     Status: Abnormal   Collection Time: 03/15/19  6:41 PM  Result Value Ref Range   Glucose-Capillary 460 (H) 70 - 99 mg/dL   Comment 1 Notify RN    Comment 2 Document in Chart   CBG monitoring, ED     Status: Abnormal   Collection Time: 03/15/19  7:47 PM  Result Value Ref Range   Glucose-Capillary 395 (H) 70 - 99 mg/dL   Comment 1 Notify RN    Comment 2 Document in Chart   CBG monitoring, ED     Status: Abnormal   Collection Time: 03/15/19  8:11 PM  Result Value Ref Range   Glucose-Capillary 388 (H) 70 - 99 mg/dL  CBG monitoring, ED     Status: Abnormal   Collection Time: 03/15/19  9:17 PM  Result Value Ref Range   Glucose-Capillary 250 (H) 70 - 99 mg/dL    Comment 1 Notify RN    Comment 2 Document in Chart   CBG monitoring, ED     Status: Abnormal   Collection Time: 03/15/19 10:32 PM  Result Value Ref Range   Glucose-Capillary 188 (H) 70 - 99 mg/dL   Comment 1 Notify RN    Comment 2 Document in Chart   CBG monitoring, ED     Status: Abnormal   Collection Time: 03/16/19 12:25 AM  Result Value Ref Range   Glucose-Capillary 215 (H) 70 - 99 mg/dL  CBG monitoring, ED     Status: Abnormal   Collection Time: 03/16/19  1:27 AM  Result Value Ref Range   Glucose-Capillary 186 (H) 70 - 99 mg/dL  Basic metabolic panel     Status: Abnormal   Collection Time: 03/16/19  1:42 AM  Result Value Ref Range   Sodium 132 (L) 135 - 145 mmol/L   Potassium 3.9 3.5 - 5.1 mmol/L    Comment: DELTA CHECK NOTED   Chloride 107 98 - 111 mmol/L   CO2 16 (L) 22 - 32 mmol/L   Glucose, Bld 208 (H) 70 - 99 mg/dL   BUN 13 6 - 20 mg/dL   Creatinine, Ser 0.87 0.44 - 1.00 mg/dL   Calcium 7.8 (L) 8.9 - 10.3 mg/dL   GFR calc non Af Amer >60 >60 mL/min   GFR calc Af Amer >60 >60 mL/min   Anion gap 9 5 - 15    Comment: Performed at [facility], [address] [place], [place] [address]  CBG monitoring, ED     Status: Abnormal   Collection Time: 03/16/19  2:30 AM  Result Value Ref Range   Glucose-Capillary 169 (H) 70 - 99 mg/dL  CBG monitoring, ED     Status: Abnormal   Collection Time: 03/16/19  3:50 AM  Result Value Ref Range   Glucose-Capillary 146 (H) 70 - 99 mg/dL  CBG monitoring, ED     Status: Abnormal   Collection Time: 03/16/19  5:03 AM  Result Value Ref Range   Glucose-Capillary 142 (H) 70 - 99 mg/dL  CBG monitoring, ED     Status: Abnormal   Collection Time: 03/16/19  6:07 AM  Result Value Ref Range   Glucose-Capillary 113 (  H) 70 - 99 mg/dL  Basic metabolic panel     Status: Abnormal   Collection Time: 03/16/19  6:45 AM  Result Value Ref Range   Sodium 136 135 - 145 mmol/L   Potassium 4.0 3.5 - 5.1 mmol/L   Chloride 109 98 - 111 mmol/L   CO2  18 (L) 22 - 32 mmol/L   Glucose, Bld 124 (H) 70 - 99 mg/dL   BUN 9 6 - 20 mg/dL   Creatinine, Ser 0.65 0.44 - 1.00 mg/dL   Calcium 8.1 (L) 8.9 - 10.3 mg/dL   GFR calc non Af Amer >60 >60 mL/min   GFR calc Af Amer >60 >60 mL/min   Anion gap 9 5 - 15    Comment: Performed at [facility], [place] [address], [place], [place] [address]  CBC     Status: Abnormal   Collection Time: 03/16/19  6:45 AM  Result Value Ref Range   WBC 11.3 (H) 4.0 - 10.5 K/uL   RBC 4.02 3.87 - 5.11 MIL/uL   Hemoglobin 12.6 12.0 - 15.0 g/dL   HCT 39.1 36.0 - 46.0 %   MCV 97.3 80.0 - 100.0 fL   MCH 31.3 26.0 - 34.0 pg   MCHC 32.2 30.0 - 36.0 g/dL   RDW 12.1 11.5 - 15.5 %   Platelets 282 150 - 400 K/uL   nRBC 0.0 0.0 - 0.2 %    Comment: Performed at [facility], [place] [address] [place], [place] [address]  CBG monitoring, ED     Status: Abnormal   Collection Time: 03/16/19  7:13 AM  Result Value Ref Range   Glucose-Capillary 120 (H) 70 - 99 mg/dL  CBG monitoring, ED     Status: Abnormal   Collection Time: 03/16/19  8:27 AM  Result Value Ref Range   Glucose-Capillary 125 (H) 70 - 99 mg/dL   Comment 1 Notify RN    Comment 2 Document in Chart   CBG monitoring, ED     Status: Abnormal   Collection Time: 03/16/19  9:40 AM  Result Value Ref Range   Glucose-Capillary 149 (H) 70 - 99 mg/dL  CBG monitoring, ED     Status: Abnormal   Collection Time: 03/16/19 10:46 AM  Result Value Ref Range   Glucose-Capillary 181 (H) 70 - 99 mg/dL  CBG monitoring, ED     Status: Abnormal   Collection Time: 03/16/19 11:56 AM  Result Value Ref Range   Glucose-Capillary 188 (H) 70 - 99 mg/dL   No results found.  Pending Labs Unresulted Labs (From admission, onward)    Start     Ordered   03/22/19 0500  Creatinine, serum  (enoxaparin (LOVENOX)    CrCl >/= 30 ml/min)  Weekly,   R    Comments: while on enoxaparin therapy    03/15/19 1959   03/16/19 0500  HIV antibody (Routine Testing)  Tomorrow morning,   R      03/15/19 1959   03/15/19 2044  MRSA PCR Screening  Once,   STAT     03/15/19 2043   03/15/19 1925  Novel Coronavirus,NAA,(SEND-OUT TO REF LAB - TAT 24-48 hrs); Hosp Order  (Asymptomatic Patients Labs)  Once,   STAT    Question:  Rule Out  Answer:  Yes   03/15/19 1924          Vitals/Pain Today's Vitals   03/16/19 0930 03/16/19 1014 03/16/19 1015 03/16/19 1048  BP: 117/80  109/69  Pulse: 79  91   Resp: 15  16   Temp:      TempSrc:      SpO2: 100%  100%   PainSc:  9   8     Isolation Precautions Contact precautions  Medications Medications  lidocaine-EPINEPHrine (XYLOCAINE W/EPI) 2 %-1:200000 (PF) injection 10 mL (has no administration in time range)  0.9 %  sodium chloride infusion ( Intravenous Stopped 03/16/19 0102)  0.9 %  sodium chloride infusion ( Intravenous Stopped 03/16/19 0103)  insulin regular, human (MYXREDLIN) 100 units/ 100 mL infusion (3.8 Units/hr Intravenous Rate/Dose Change 03/16/19 1158)  vancomycin (VANCOCIN) IVPB 1000 mg/200 mL premix (has no administration in time range)  0.9 %  sodium chloride infusion (has no administration in time range)  dextrose 5 %-0.45 % sodium chloride infusion ( Intravenous New Bag/Given 03/15/19 2256)  enoxaparin (LOVENOX) injection 40 mg (has no administration in time range)  acetaminophen (TYLENOL) tablet 650 mg (has no administration in time range)    Or  acetaminophen (TYLENOL) suppository 650 mg (has no administration in time range)  clonazePAM (KLONOPIN) tablet 0.5 mg (has no administration in time range)  lamoTRIgine (LAMICTAL) tablet 25 mg (25 mg Oral Given 03/16/19 1049)  QUEtiapine (SEROQUEL) tablet 50-100 mg (has no administration in time range)  spironolactone (ALDACTONE) tablet 100 mg (100 mg Oral Given 03/16/19 1049)  Vilazodone HCl (VIIBRYD) TABS 40 mg (has no administration in time range)  sodium chloride 0.9 % bolus 1,000 mL (1,000 mLs Intravenous Not Given 03/16/19 0342)  insulin glargine (LANTUS) injection 60  Units (has no administration in time range)  sodium chloride 0.9 % bolus 1,000 mL (0 mLs Intravenous Stopped 03/15/19 1915)  insulin aspart (novoLOG) injection 6 Units (6 Units Intravenous Given 03/15/19 1811)  ondansetron (ZOFRAN) injection 4 mg (4 mg Intravenous Given 03/15/19 1759)  HYDROcodone-acetaminophen (NORCO/VICODIN) 5-325 MG per tablet 1 tablet (1 tablet Oral Given 03/15/19 1758)  vancomycin (VANCOCIN) 2,000 mg in sodium chloride 0.9 % 500 mL IVPB (0 mg Intravenous Stopped 03/15/19 2322)  fentaNYL (SUBLIMAZE) injection 100 mcg (100 mcg Intravenous Given 03/15/19 2134)  pantoprazole (PROTONIX) injection 40 mg (40 mg Intravenous Given 03/15/19 2140)  metoCLOPramide (REGLAN) injection 10 mg (10 mg Intravenous Given 03/15/19 2133)  fentaNYL (SUBLIMAZE) injection 100 mcg (100 mcg Intravenous Given 03/16/19 1049)  potassium chloride SA (K-DUR) CR tablet 20 mEq (20 mEq Oral Given 03/16/19 0714)    Mobility  Low fall risk   Focused Assessments    R Recommendations: See Admitting Provider Note  Report given to:   Additional Notes: [id number]

## 2019-03-17 ENCOUNTER — Encounter (HOSPITAL_COMMUNITY): Payer: Self-pay | Admitting: General Practice

## 2019-03-17 ENCOUNTER — Inpatient Hospital Stay (HOSPITAL_COMMUNITY): Payer: Medicaid Other | Admitting: Anesthesiology

## 2019-03-17 ENCOUNTER — Encounter (HOSPITAL_COMMUNITY)
Admission: EM | Disposition: A | Discharge status: Home Health | Payer: Self-pay | Source: Home / Self Care | Attending: Internal Medicine

## 2019-03-17 HISTORY — PX: INCISION AND DRAINAGE ABSCESS: SHX5864

## 2019-03-17 LAB — CBC WITH DIFFERENTIAL/PLATELET
Abs Immature Granulocytes: 0.04 10*3/uL (ref 0.00–0.07)
Basophils Absolute: 0.1 10*3/uL (ref 0.0–0.1)
Basophils Relative: 1 %
Eosinophils Absolute: 0.3 10*3/uL (ref 0.0–0.5)
Eosinophils Relative: 3 %
HCT: 36.7 % (ref 36.0–46.0)
Hemoglobin: 12.1 g/dL (ref 12.0–15.0)
Immature Granulocytes: 0 %
Lymphocytes Relative: 26 %
Lymphs Abs: 2.5 10*3/uL (ref 0.7–4.0)
MCH: 31.7 pg (ref 26.0–34.0)
MCHC: 33 g/dL (ref 30.0–36.0)
MCV: 96.1 fL (ref 80.0–100.0)
Monocytes Absolute: 0.7 10*3/uL (ref 0.1–1.0)
Monocytes Relative: 8 %
Neutro Abs: 5.7 10*3/uL (ref 1.7–7.7)
Neutrophils Relative %: 62 %
Platelets: 251 10*3/uL (ref 150–400)
RBC: 3.82 MIL/uL — ABNORMAL LOW (ref 3.87–5.11)
RDW: 12.2 % (ref 11.5–15.5)
WBC: 9.3 10*3/uL (ref 4.0–10.5)
nRBC: 0 % (ref 0.0–0.2)

## 2019-03-17 LAB — RENAL FUNCTION PANEL
Albumin: 2.5 g/dL — ABNORMAL LOW (ref 3.5–5.0)
Anion gap: 6 (ref 5–15)
BUN: 5 mg/dL — ABNORMAL LOW (ref 6–20)
CO2: 22 mmol/L (ref 22–32)
Calcium: 8.1 mg/dL — ABNORMAL LOW (ref 8.9–10.3)
Chloride: 110 mmol/L (ref 98–111)
Creatinine, Ser: 0.54 mg/dL (ref 0.44–1.00)
GFR calc Af Amer: >60 mL/min (ref >60)
GFR calc non Af Amer: >60 mL/min (ref >60)
Glucose, Bld: 155 mg/dL — ABNORMAL HIGH (ref 70–99)
Phosphorus: 3.4 mg/dL (ref 2.5–4.6)
Potassium: 3.8 mmol/L (ref 3.5–5.1)
Sodium: 138 mmol/L (ref 135–145)

## 2019-03-17 LAB — NOVEL CORONAVIRUS, NAA (HOSP ORDER, SEND-OUT TO REF LAB; TAT 18-24 HRS): SARS-CoV-2, NAA: NOT DETECTED

## 2019-03-17 LAB — GLUCOSE, CAPILLARY
Glucose-Capillary: 154 mg/dL — ABNORMAL HIGH (ref 70–99)
Glucose-Capillary: 125 mg/dL — ABNORMAL HIGH (ref 70–99)
Glucose-Capillary: 131 mg/dL — ABNORMAL HIGH (ref 70–99)
Glucose-Capillary: 84 mg/dL (ref 70–99)
Glucose-Capillary: 184 mg/dL — ABNORMAL HIGH (ref 70–99)

## 2019-03-17 LAB — MRSA PCR SCREENING: MRSA by PCR: NEGATIVE

## 2019-03-17 LAB — SARS CORONAVIRUS 2 BY RT PCR (HOSPITAL ORDER, PERFORMED IN ~~LOC~~ HOSPITAL LAB): SARS Coronavirus 2: NEGATIVE

## 2019-03-17 SURGERY — INCISION AND DRAINAGE, ABSCESS
Anesthesia: General | Site: Thigh | Laterality: Right

## 2019-03-17 MED ORDER — SODIUM CHLORIDE 0.9 % IV SOLN
INTRAVENOUS | Status: DC
  Administered 2019-03-17: 14:00:00 via INTRAVENOUS

## 2019-03-17 MED ORDER — MIDAZOLAM HCL 2 MG/2ML IJ SOLN
INTRAMUSCULAR | Status: AC
  Filled 2019-03-17: qty 2

## 2019-03-17 MED ORDER — SUCCINYLCHOLINE CHLORIDE 20 MG/ML IJ SOLN
INTRAMUSCULAR | Status: DC | PRN
  Administered 2019-03-17: 120 mg via INTRAVENOUS

## 2019-03-17 MED ORDER — ONDANSETRON HCL 4 MG/2ML IJ SOLN
INTRAMUSCULAR | Status: AC
  Filled 2019-03-17: qty 4

## 2019-03-17 MED ORDER — LIDOCAINE 2% (20 MG/ML) 5 ML SYRINGE
INTRAMUSCULAR | Status: DC | PRN
  Administered 2019-03-17: 60 mg via INTRAVENOUS

## 2019-03-17 MED ORDER — ENOXAPARIN SODIUM 40 MG/0.4ML ~~LOC~~ SOLN
40.0000 mg | Freq: Every day | SUBCUTANEOUS | Status: DC
  Administered 2019-03-18: 40 mg via SUBCUTANEOUS
  Filled 2019-03-17: qty 0.4

## 2019-03-17 MED ORDER — DEXAMETHASONE SODIUM PHOSPHATE 10 MG/ML IJ SOLN
INTRAMUSCULAR | Status: AC
  Filled 2019-03-17: qty 2

## 2019-03-17 MED ORDER — HYDROMORPHONE HCL 1 MG/ML IJ SOLN
INTRAMUSCULAR | Status: AC
  Filled 2019-03-17: qty 1

## 2019-03-17 MED ORDER — ONDANSETRON HCL 4 MG/2ML IJ SOLN
INTRAMUSCULAR | Status: DC | PRN
  Administered 2019-03-17: 4 mg via INTRAVENOUS

## 2019-03-17 MED ORDER — FENTANYL CITRATE (PF) 100 MCG/2ML IJ SOLN
INTRAMUSCULAR | Status: DC | PRN
  Administered 2019-03-17: 100 ug via INTRAVENOUS

## 2019-03-17 MED ORDER — PROMETHAZINE HCL 25 MG/ML IJ SOLN: 6.2500 mg | INTRAMUSCULAR | Status: DC | PRN

## 2019-03-17 MED ORDER — ACETAMINOPHEN 500 MG PO TABS
1000.0000 mg | ORAL_TABLET | Freq: Four times a day (QID) | ORAL | Status: DC
  Administered 2019-03-17 – 2019-03-18 (×3): 1000 mg via ORAL
  Filled 2019-03-17 (×3): qty 2

## 2019-03-17 MED ORDER — BUPIVACAINE-EPINEPHRINE 0.25% -1:200000 IJ SOLN
INTRAMUSCULAR | Status: DC | PRN
  Administered 2019-03-17: 10 mL

## 2019-03-17 MED ORDER — ALBUTEROL SULFATE HFA 108 (90 BASE) MCG/ACT IN AERS
INHALATION_SPRAY | RESPIRATORY_TRACT | Status: DC | PRN
  Administered 2019-03-17 (×2): 2 via RESPIRATORY_TRACT

## 2019-03-17 MED ORDER — TRAMADOL HCL 50 MG PO TABS
50.0000 mg | ORAL_TABLET | Freq: Four times a day (QID) | ORAL | Status: DC | PRN
  Administered 2019-03-17 – 2019-03-18 (×2): 50 mg via ORAL
  Filled 2019-03-17 (×2): qty 1

## 2019-03-17 MED ORDER — BUPIVACAINE-EPINEPHRINE (PF) 0.25% -1:200000 IJ SOLN
INTRAMUSCULAR | Status: AC
  Filled 2019-03-17: qty 30

## 2019-03-17 MED ORDER — PROPOFOL 10 MG/ML IV BOLUS
INTRAVENOUS | Status: DC | PRN
  Administered 2019-03-17: 150 mg via INTRAVENOUS
  Administered 2019-03-17: 50 mg via INTRAVENOUS

## 2019-03-17 MED ORDER — MIDAZOLAM HCL 5 MG/5ML IJ SOLN
INTRAMUSCULAR | Status: DC | PRN
  Administered 2019-03-17: 2 mg via INTRAVENOUS

## 2019-03-17 MED ORDER — ONDANSETRON HCL 4 MG/2ML IJ SOLN
4.0000 mg | Freq: Four times a day (QID) | INTRAMUSCULAR | Status: DC | PRN
  Administered 2019-03-17: 4 mg via INTRAVENOUS
  Filled 2019-03-17: qty 2

## 2019-03-17 MED ORDER — HYDROMORPHONE HCL 1 MG/ML IJ SOLN
0.2500 mg | INTRAMUSCULAR | Status: DC | PRN
  Administered 2019-03-17 (×2): 0.5 mg via INTRAVENOUS

## 2019-03-17 MED ORDER — LACTATED RINGERS IV SOLN
INTRAVENOUS | Status: DC | PRN
  Administered 2019-03-17: 15:00:00 via INTRAVENOUS

## 2019-03-17 MED ORDER — KETOROLAC TROMETHAMINE 30 MG/ML IJ SOLN: 30.0000 mg | Freq: Once | INTRAMUSCULAR | Status: DC | PRN

## 2019-03-17 MED ORDER — ALBUTEROL SULFATE HFA 108 (90 BASE) MCG/ACT IN AERS
INHALATION_SPRAY | RESPIRATORY_TRACT | Status: AC
  Filled 2019-03-17: qty 6.7

## 2019-03-17 MED ORDER — FENTANYL CITRATE (PF) 250 MCG/5ML IJ SOLN
INTRAMUSCULAR | Status: AC
  Filled 2019-03-17: qty 5

## 2019-03-17 SURGICAL SUPPLY — 29 items
BNDG GAUZE ELAST 4 BULKY (GAUZE/BANDAGES/DRESSINGS) IMPLANT
CANISTER SUCT 3000ML PPV (MISCELLANEOUS) ×2 IMPLANT
COVER SURGICAL LIGHT HANDLE (MISCELLANEOUS) ×2 IMPLANT
COVER WAND RF STERILE (DRAPES) ×1 IMPLANT
DRAPE LAPAROSCOPIC ABDOMINAL (DRAPES) ×2 IMPLANT
DRSG PAD ABDOMINAL 8X10 ST (GAUZE/BANDAGES/DRESSINGS) ×1 IMPLANT
ELECT REM PT RETURN 9FT ADLT (ELECTROSURGICAL) ×2
ELECTRODE REM PT RTRN 9FT ADLT (ELECTROSURGICAL) ×1 IMPLANT
GAUZE SPONGE 4X4 12PLY STRL (GAUZE/BANDAGES/DRESSINGS) IMPLANT
GAUZE SPONGE 4X4 12PLY STRL LF (GAUZE/BANDAGES/DRESSINGS) ×1 IMPLANT
GLOVE BIO SURGEON STRL SZ8 (GLOVE) ×2 IMPLANT
GLOVE BIOGEL PI IND STRL 8 (GLOVE) ×1 IMPLANT
GLOVE BIOGEL PI INDICATOR 8 (GLOVE) ×1
GOWN STRL REUS W/ TWL LRG LVL3 (GOWN DISPOSABLE) ×1 IMPLANT
GOWN STRL REUS W/ TWL XL LVL3 (GOWN DISPOSABLE) ×1 IMPLANT
GOWN STRL REUS W/TWL LRG LVL3 (GOWN DISPOSABLE) ×2
GOWN STRL REUS W/TWL XL LVL3 (GOWN DISPOSABLE) ×2
KIT BASIN OR (CUSTOM PROCEDURE TRAY) ×2 IMPLANT
KIT TURNOVER KIT B (KITS) ×2 IMPLANT
NS IRRIG 1000ML POUR BTL (IV SOLUTION) ×2 IMPLANT
PACK GENERAL/GYN (CUSTOM PROCEDURE TRAY) ×2 IMPLANT
PAD ARMBOARD 7.5X6 YLW CONV (MISCELLANEOUS) ×4 IMPLANT
PENCIL SMOKE EVACUATOR (MISCELLANEOUS) ×2 IMPLANT
SPECIMEN JAR SMALL (MISCELLANEOUS) IMPLANT
SWAB COLLECTION DEVICE MRSA (MISCELLANEOUS) ×1 IMPLANT
SWAB CULTURE ESWAB REG 1ML (MISCELLANEOUS) ×1 IMPLANT
TAPE CLOTH SURG 4X10 WHT LF (GAUZE/BANDAGES/DRESSINGS) ×1 IMPLANT
TOWEL GREEN STERILE FF (TOWEL DISPOSABLE) ×2 IMPLANT
TOWEL OR 17X26 10 PK STRL BLUE (TOWEL DISPOSABLE) ×2 IMPLANT

## 2019-03-17 NOTE — Anesthesia Postprocedure Evaluation (Signed)
Anesthesia Post Note  Patient: [name]  Procedure(s) Performed: INCISION AND DRAINAGE RIGHT THIGH ABSCESS (Right Thigh)     Patient location during evaluation: PACU Anesthesia Type: General Level of consciousness: awake and alert Pain management: pain level controlled Vital Signs Assessment: post-procedure vital signs reviewed and stable Respiratory status: spontaneous breathing, nonlabored ventilation, respiratory function stable and patient connected to nasal cannula oxygen Cardiovascular status: blood pressure returned to baseline and stable Postop Assessment: no apparent nausea or vomiting Anesthetic complications: no    Last Vitals:  Vitals:   03/17/19 1950 03/17/19 2000  BP:    Pulse:    Resp:  20  Temp: 36.8 C   SpO2:      Last Pain:  Vitals:   03/17/19 1950  TempSrc: Oral  PainSc: 6                  Ryan P Ellender

## 2019-03-17 NOTE — Interval H&P Note (Signed)
History and Physical Interval Note:  03/17/2019 3:19 PM  [name]  has presented today for surgery, with the diagnosis of Abscess.  The various methods of treatment have been discussed with the patient and family. After consideration of risks, benefits and other options for treatment, the patient has consented to  Procedure(s): INCISION AND DRAINAGE RIGHT THIGH ABSCESS (Right) as a surgical intervention.  The patient's history has been reviewed, patient examined, no change in status, stable for surgery.  I have reviewed the patient's chart and labs.  Questions were answered to the patient's satisfaction.     [facility]

## 2019-03-17 NOTE — Anesthesia Preprocedure Evaluation (Addendum)
Anesthesia Evaluation  Patient identified by MRN, date of birth, ID band Patient awake    Reviewed: Allergy & Precautions, NPO status , Patient's Chart, lab work & pertinent test results  Airway Mallampati: II  TM Distance: >3 FB Neck ROM: Full    Dental no notable dental hx.    Pulmonary asthma , Current Smoker,    Pulmonary exam normal breath sounds clear to auscultation       Cardiovascular negative cardio ROS Normal cardiovascular exam Rhythm:Regular Rate:Normal  ECG: SR, rate 93   Neuro/Psych PSYCHIATRIC DISORDERS Anxiety Depression Bipolar Disorder TIA   GI/Hepatic Neg liver ROS, GERD  Medicated and Controlled,  Endo/Other  diabetes, Insulin DependentPCOS (polycystic ovarian syndrome)  Renal/GU negative Renal ROS     Musculoskeletal negative musculoskeletal ROS (+)   Abdominal (+) + obese,   Peds  Hematology negative hematology ROS (+)   Anesthesia Other Findings Abscess  Reproductive/Obstetrics hcg negative                            Anesthesia Physical Anesthesia Plan  ASA: III  Anesthesia Plan: General   Post-op Pain Management:    Induction: Intravenous  PONV Risk Score and Plan: 2 and Ondansetron, Dexamethasone, Midazolam and Treatment may vary due to age or medical condition  Airway Management Planned: Oral ETT  Additional Equipment:   Intra-op Plan:   Post-operative Plan: Extubation in OR  Informed Consent: I have reviewed the patients History and Physical, chart, labs and discussed the procedure including the risks, benefits and alternatives for the proposed anesthesia with the patient or authorized representative who has indicated his/her understanding and acceptance.     Dental advisory given  Plan Discussed with: CRNA  Anesthesia Plan Comments:         Anesthesia Quick Evaluation

## 2019-03-17 NOTE — Transfer of Care (Signed)
Immediate Anesthesia Transfer of Care Note  Patient: [name]  Procedure(s) Performed: INCISION AND DRAINAGE RIGHT THIGH ABSCESS (Right )  Patient Location: PACU  Anesthesia Type:General  Level of Consciousness: awake, alert  and oriented  Airway & Oxygen Therapy: Patient Spontanous Breathing and Patient connected to nasal cannula oxygen  Post-op Assessment: Report given to RN, Post -op Vital signs reviewed and stable and Patient moving all extremities X 4  Post vital signs: Reviewed and stable  Last Vitals:  Vitals Value Taken Time  BP    Temp    Pulse    Resp    SpO2      Last Pain:  Vitals:   03/17/19 1214  TempSrc:   PainSc: 8       Patients Stated Pain Goal: 0 (63/49/49 4473)  Complications: No apparent anesthesia complications

## 2019-03-17 NOTE — Progress Notes (Signed)
[name] Kitchen  PROGRESS NOTE    [name]  ZOX:[id number] DOB: [birth year] DOA: 03/15/2019 PCP: Harlan Stains, MD   Brief Narrative:   Providence Crosby a 38 y.o.femalewith medical history significant ofleft axillar and other areasskin abscesses, right inner thigh cellulitis, asthma as a child, active smoker, skin cancer,anxiety, depression, type I diabetes, GERD, history of pneumonia, history of polycystic ovarian syndrome, history of supraventricular tachycardia, vitamin D deficiency who is coming to the emergency department due to not feeling well since yesterday evening with polydipsia, polyuria and hyperglycemia. She states that she vomited 3 times this morning and has had diarrhea 5-6 times already today. She mentions that she has been having frequent palpitations and mild lightheadedness. She has lower chest and upper abdomen discomfort. She also complains of developing an abscess on her mid right eye posterolateral aspect. She denies constipation, melena or hematochezia. No dysuria, frequency or hematuria. She denies fever, chills, sore throat, rhinorrhea, wheezing, hemoptysis, productive cough, dyspnea, diaphoresis, PND, orthopnea, but states she occasionally gets lower extremity edema.   Assessment & Plan:   Principal Problem:   DKA (diabetic ketoacidosis) ([place]) Active Problems:   Diabetes mellitus type I ([place])   Depression   Esophageal reflux   Abscess of right thigh   DKA/Diabetes mellitus type I     - IV hydration/IV insulin infusion; transition to SQ insulin     - Monitor CBG hourly/BMP every 4 hours.     - Replace electrolytes as needed.     - glucose improved, gap closed; transition to SQ insulin (60u lantus qday, SSI)     - transitioned to SQ insulin, monitor  Abscess of right thigh     - Declined I&D in the ER.     - Analgesics as needed.     - Continue vancomycin per pharmacy; may consider change to clinda; if she continues to decline I&D, can consider  outpt follow up     - She agreed to be seen by surgery for I&D; to go to OR today     - she can not take clinda or bactrim; she is currently on doxy 100mg  BID chronically; after I&D, will continue vanc through tonight and then switch back to doxy in AM  Depression     - Continue Seroquel 50-100 mg p.o. bedtime; Viibryd 40 mg p.o. daily.  Esophageal reflux     - Protonix 40 mg IVP x1 given.     - protonix PO  Paroxysmal SVT (supraventricular tachycardia) ([facility])     - Continue IV hydration.     - Keep electrolytes optimized.   DVT prophylaxis: lovenox Code Status: FULL  Disposition Plan: TBD   Consultants:   General Surgery  Antimicrobials:  [name] Kitchen Vanc IV 1g qday    Subjective: "I feel nauseous."  Objective: Vitals:   03/16/19 1403 03/16/19 2134 03/17/19 0421 03/17/19 0800  BP:  106/69 115/82 110/68  Pulse: 78 95 85   Resp: 18 18 18    Temp: 98.4 F (36.9 C) 98.7 F (37.1 C) 98.6 F (37 C)   TempSrc: Oral Oral Oral   SpO2:  96% 98%   Weight:      Height:        Intake/Output Summary (Last 24 hours) at 03/17/2019 1018 Last data filed at 03/17/2019 0305 Gross per 24 hour  Intake 3026.81 ml  Output -  Net 3026.81 ml   Filed Weights   03/16/19 1300  Weight: 99.9 kg    Examination:  General: 37  y.o. female resting in bed in NAD Cardiovascular: RRR, +S1, S2, no m/g/r, equal pulses throughout Respiratory: CTABL, no w/r/r, normal WOB GI: BS+, NDNT, no masses noted, no organomegaly noted MSK: No e/c/c Skin: right thigh erythema, edema Neuro: A&O x 3, no focal deficits Psyc: Appropriate interaction and affect, calm/cooperative    Data Reviewed: I have personally reviewed following labs and imaging studies.  CBC: Recent Labs  Lab 03/14/19 0255 03/15/19 1704 03/16/19 0645  WBC 10.9* 12.7* 11.3*  NEUTROABS 6.6  --   --   HGB 14.9 15.5* 12.6  HCT 44.0 47.6* 39.1  MCV 95.9 99.0 97.3  PLT 341 341 778   Basic Metabolic Panel: Recent Labs  Lab 03/15/19  1704 03/15/19 1837 03/16/19 0142 03/16/19 0645 03/16/19 1413  NA 133* 135 132* 136 135  K 4.5 5.1 3.9 4.0 3.7  CL 99 104 107 109 109  CO2 16* 8* 16* 18* 20*  GLUCOSE 501* 462* 208* 124* 188*  BUN 16 17 13 9 6   CREATININE 1.34* 1.16* 0.87 0.65 0.68  CALCIUM 9.1 9.0 7.8* 8.1* 8.3*  MG  --  1.9  --   --   --   PHOS  --   --   --   --  2.1*   GFR: Estimated Creatinine Clearance: 11.1 mL/min (by C-G formula based on SCr of 0.68 mg/dL). Liver Function Tests: Recent Labs  Lab 03/14/19 0255 03/16/19 1413  AST 13*  --   ALT 13  --   ALKPHOS 93  --   BILITOT 0.7  --   PROT 6.4*  --   ALBUMIN 3.5 2.8*   No results for input(s): LIPASE, AMYLASE in the last 168 hours. No results for input(s): AMMONIA in the last 168 hours. Coagulation Profile: No results for input(s): INR, PROTIME in the last 168 hours. Cardiac Enzymes: No results for input(s): CKTOTAL, CKMB, CKMBINDEX, TROPONINI in the last 168 hours. BNP (last 3 results) No results for input(s): PROBNP in the last 8760 hours. HbA1C: No results for input(s): HGBA1C in the last 72 hours. CBG: Recent Labs  Lab 03/16/19 1341 03/16/19 1443 03/16/19 1541 03/16/19 2147 03/17/19 0740  GLUCAP 199* 182* 169* 253* 154*   Lipid Profile: No results for input(s): CHOL, HDL, LDLCALC, TRIG, CHOLHDL, LDLDIRECT in the last 72 hours. Thyroid Function Tests: No results for input(s): TSH, T4TOTAL, FREET4, T3FREE, THYROIDAB in the last 72 hours. Anemia Panel: No results for input(s): VITAMINB12, FOLATE, FERRITIN, TIBC, IRON, RETICCTPCT in the last 72 hours. Sepsis Labs: No results for input(s): PROCALCITON, LATICACIDVEN in the last 168 hours.  No results found for this or any previous visit (from the past 240 hour(s)).     Radiology Studies: No results found.    Scheduled Meds: . enoxaparin (LOVENOX) injection  40 mg Subcutaneous Daily  . insulin aspart  0-15 Units Subcutaneous TID WC  . insulin aspart  0-5 Units Subcutaneous  QHS  . insulin glargine  60 Units Subcutaneous Daily  . lamoTRIgine  25 mg Oral Daily  . lidocaine-EPINEPHrine  10 mL Infiltration Once  . pantoprazole  40 mg Oral Daily  . QUEtiapine  50-100 mg Oral QHS  . spironolactone  100 mg Oral Daily  . Vilazodone HCl  40 mg Oral Daily   Continuous Infusions: . sodium chloride 125 mL/hr at 03/17/19 0305  . sodium chloride    . sodium chloride    . vancomycin 1,000 mg (03/16/19 1959)     LOS: 2 days  Time spent: 25 minutes spent in the coordination of care today.    Jonnie Finner, DO [facility] Pager [phone]  If 7PM-7AM, please contact night-coverage [email or web address] Password [facility] 03/17/2019, 10:18 AM

## 2019-03-17 NOTE — Anesthesia Procedure Notes (Signed)
Procedure Name: Intubation Date/Time: 03/17/2019 3:53 PM Performed by: Neldon Newport, CRNA Pre-anesthesia Checklist: Timeout performed, Patient being monitored, Emergency Drugs available, Patient identified and Suction available Patient Re-evaluated:Patient Re-evaluated prior to induction Oxygen Delivery Method: Circle system utilized Preoxygenation: Pre-oxygenation with 100% oxygen Induction Type: IV induction and Rapid sequence Ventilation: Mask ventilation without difficulty Laryngoscope Size: Mac and 4 Grade View: Grade I Tube type: Oral Tube size: 7.0 mm Number of attempts: 1 Placement Confirmation: breath sounds checked- equal and bilateral,  positive ETCO2 and ETT inserted through vocal cords under direct vision Secured at: 22 cm Tube secured with: Tape Dental Injury: Teeth and Oropharynx as per pre-operative assessment

## 2019-03-17 NOTE — Progress Notes (Signed)
SARS test has not been collected.  Informed charge nurse Janett Billow, RN on 6E that patient needed this collected prior to coming for surgery.  Janett Billow stated that she would take care of it.

## 2019-03-17 NOTE — Op Note (Signed)
Preop diagnosis: Right thigh abscess 4 cm subcutaneous  Postop diagnosis: Same  Procedure: Incision and drainage right thigh abscess  Surgeon: [name] MD  Anesthesia: General  EBL: Minimal  Indications for procedure: The patient presents with a right thigh abscess.  She presents for incision and drainage.The procedure has been discussed with the patient.  Alternative therapies have been discussed with the patient.  Operative risks include bleeding,  Infection,  Organ injury,  Nerve injury,  Blood vessel injury,  DVT,  Pulmonary embolism,  Death,  And possible reoperation.  Medical management risks include worsening of present situation.  The success of the procedure is 50 -90 % at treating patients symptoms.  The patient understands and agrees to proceed.      Description of procedure: The patient was brought to the operating.  Right thigh was marked as correct side.  After induction of general esthesia the right thigh was prepped and draped in sterile fashion.  Timeout was done.  The abscess was lateral and a cruciate incision was made with good drainage.  Hemostasis achieved and packed with saline soaked Kerlix.  Dry dressing applied.  All counts found to be correct.  The patient was awoke and taken to  recovery satisfactory condition.

## 2019-03-18 ENCOUNTER — Encounter (HOSPITAL_COMMUNITY): Payer: Self-pay | Admitting: Surgery

## 2019-03-18 LAB — CBC WITH DIFFERENTIAL/PLATELET
Abs Immature Granulocytes: 0.03 10*3/uL (ref 0.00–0.07)
Basophils Absolute: 0.1 10*3/uL (ref 0.0–0.1)
Basophils Relative: 1 %
Eosinophils Absolute: 0.2 10*3/uL (ref 0.0–0.5)
Eosinophils Relative: 2 %
HCT: 34.5 % — ABNORMAL LOW (ref 36.0–46.0)
Hemoglobin: 11.3 g/dL — ABNORMAL LOW (ref 12.0–15.0)
Immature Granulocytes: 0 %
Lymphocytes Relative: 22 %
Lymphs Abs: 2.3 10*3/uL (ref 0.7–4.0)
MCH: 31.7 pg (ref 26.0–34.0)
MCHC: 32.8 g/dL (ref 30.0–36.0)
MCV: 96.9 fL (ref 80.0–100.0)
Monocytes Absolute: 0.5 10*3/uL (ref 0.1–1.0)
Monocytes Relative: 4 %
Neutro Abs: 7.7 10*3/uL (ref 1.7–7.7)
Neutrophils Relative %: 71 %
Platelets: 264 10*3/uL (ref 150–400)
RBC: 3.56 MIL/uL — ABNORMAL LOW (ref 3.87–5.11)
RDW: 12.1 % (ref 11.5–15.5)
WBC: 10.8 10*3/uL — ABNORMAL HIGH (ref 4.0–10.5)
nRBC: 0 % (ref 0.0–0.2)

## 2019-03-18 LAB — GLUCOSE, CAPILLARY
Glucose-Capillary: 112 mg/dL — ABNORMAL HIGH (ref 70–99)
Glucose-Capillary: 148 mg/dL — ABNORMAL HIGH (ref 70–99)

## 2019-03-18 LAB — RENAL FUNCTION PANEL
Albumin: 2.2 g/dL — ABNORMAL LOW (ref 3.5–5.0)
Anion gap: 8 (ref 5–15)
BUN: 5 mg/dL — ABNORMAL LOW (ref 6–20)
CO2: 22 mmol/L (ref 22–32)
Calcium: 7.9 mg/dL — ABNORMAL LOW (ref 8.9–10.3)
Chloride: 107 mmol/L (ref 98–111)
Creatinine, Ser: 0.77 mg/dL (ref 0.44–1.00)
GFR calc Af Amer: >60 mL/min (ref >60)
GFR calc non Af Amer: >60 mL/min (ref >60)
Glucose, Bld: 178 mg/dL — ABNORMAL HIGH (ref 70–99)
Phosphorus: 2.9 mg/dL (ref 2.5–4.6)
Potassium: 3.4 mmol/L — ABNORMAL LOW (ref 3.5–5.1)
Sodium: 137 mmol/L (ref 135–145)

## 2019-03-18 LAB — MAGNESIUM: Magnesium: 1.7 mg/dL (ref 1.7–2.4)

## 2019-03-18 MED ORDER — ACETAMINOPHEN 500 MG PO TABS: 1000.0000 mg | ORAL_TABLET | Freq: Four times a day (QID) | ORAL | Status: DC | PRN

## 2019-03-18 MED ORDER — TRAMADOL HCL 50 MG PO TABS: 50.0000 mg | ORAL_TABLET | Freq: Four times a day (QID) | ORAL | 0 refills | Status: DC | PRN

## 2019-03-18 NOTE — Progress Notes (Signed)
[facility] Progress Note  1 Day Post-Op  Subjective: CC: RLE pain Patient states site is sore. Reports blood sugars were recently up at home. Concerned about abx cost because she does not have insurance. Instructed on dressing care.   Objective: Vital signs in last 24 hours: Temp:  [97.2 F (36.2 C)-98.5 F (36.9 C)] 98.5 F (36.9 C) (06/26 0600) Pulse Rate:  [82-88] 85 (06/26 0600) Resp:  [13-25] 20 (06/25 2000) BP: (95-112)/(62-72) 98/62 (06/26 0600) SpO2:  [90 %-96 %] 95 % (06/26 0600) Weight:  [99.9 kg-102.7 kg] 102.7 kg (06/26 0600) Last BM Date: 03/16/19  Intake/Output from previous day: 06/25 0701 - 06/26 0700 In: 2980.4 [I.V.:2980.4] Out: 20 [Blood:20] Intake/Output this shift: No intake/output data recorded.  PE: Gen:  Alert, NAD, pleasant Pulm:  Normal effort Ext: R thigh with wound - some dark tissue from cautery in wound base, minimal purulence, surrounding erythema and induration, TTP Psych: A&Ox3   Lab Results:  Recent Labs    03/17/19 1042 03/18/19 0504  WBC 9.3 10.8*  HGB 12.1 11.3*  HCT 36.7 34.5*  PLT 251 264   BMET Recent Labs    03/17/19 1042 03/18/19 0504  NA 138 137  K 3.8 3.4*  CL 110 107  CO2 22 22  GLUCOSE 155* 178*  BUN 5* 5*  CREATININE 0.54 0.77  CALCIUM 8.1* 7.9*   PT/INR No results for input(s): LABPROT, INR in the last 72 hours. CMP     Component Value Date/Time   NA 137 03/18/2019 0504   K 3.4 (L) 03/18/2019 0504   CL 107 03/18/2019 0504   CO2 22 03/18/2019 0504   GLUCOSE 178 (H) 03/18/2019 0504   BUN 5 (L) 03/18/2019 0504   CREATININE 0.77 03/18/2019 0504   CALCIUM 7.9 (L) 03/18/2019 0504   CALCIUM 8.9 08/04/2008 2210   PROT 6.4 (L) 03/14/2019 0255   ALBUMIN 2.2 (L) 03/18/2019 0504   AST 13 (L) 03/14/2019 0255   ALT 13 03/14/2019 0255   ALKPHOS 93 03/14/2019 0255   BILITOT 0.7 03/14/2019 0255   GFRNONAA >60 03/18/2019 0504   GFRAA >60 03/18/2019 0504   Lipase     Component Value Date/Time    LIPASE 19 12/26/2017 1758       Studies/Results: No results found.  Anti-infectives: Anti-infectives (From admission, onward)   Start     Dose/Rate Route Frequency Ordered Stop   03/16/19 2000  vancomycin (VANCOCIN) IVPB 1000 mg/200 mL premix     1,000 mg 200 mL/hr over 60 Minutes Intravenous Every 24 hours 03/15/19 1950     03/15/19 2000  vancomycin (VANCOCIN) 2,000 mg in sodium chloride 0.9 % 500 mL IVPB     2,000 mg 250 mL/hr over 120 Minutes Intravenous  Once 03/15/19 1947 03/15/19 2322       Assessment/Plan DKA/T1DM Depression Esophageal reflux Paroxysmal SVT  R lateral thigh abscess S/p I&D 03/17/19 Dr. Brantley Stage - cxs pending, PO doxy on discharge for MRSA coverage, GS shows G+ cocci - BID dressing changes - wet to dry dressing - follow up in [facility] office in 1-2 weeks - stable for discharge from a surgical standpoint  FEN: CM/HH diet VTE: SCDs, lovenox ID: [id number] 6/23>>  LOS: 3 days    [name] , [facility] 03/18/2019, 10:24 AM Pager: [place]: [phone]

## 2019-03-18 NOTE — Discharge Instructions (Signed)
WOUND CARE: - dressing to be changed twice daily - supplies: sterile saline, gauze, scissors, ABD pads, tape  - remove dressing and all packing carefully, moistening with sterile saline as needed to avoid packing/internal dressing sticking to the wound. - clean edges of skin around the wound with water/gauze, making sure there is no tape debris or leakage left on skin that could cause skin irritation or breakdown. - dampen clean gauze with sterile saline and pack wound from wound base to skin level, making sure to take note of any possible areas of wound tracking, tunneling and packing appropriately. Wound can be packed loosely.  - cover wound with a dry ABD pad and secure with tape.  - write the date/time on the dry dressing/tape to better track when the last dressing change occurred. - apply any skin protectant/powder recommended by clinician to protect skin/skin folds. - change dressing as needed if leakage occurs, wound gets contaminated, or patient requests to shower. - patient may shower daily with wound open and following the shower the wound should be dried and a clean dressing placed.

## 2019-03-18 NOTE — TOC Transition Note (Signed)
Transition of Care [facility]) - CM/SW Discharge Note   Patient Details  Name: [name] MRN: [id number] Date of Birth: 09/26/1980  [facility] CM/SW Contact:  Bethena Roys, RN Phone Number: 03/18/2019, 3:35 PM   Clinical Narrative: Choice offered for [facility] Services. Pt without insurance- charity care for [facility] RN for dressing changes. SOC to begin within 24-48 hours. No further needs from CM at this time.     Final next level of care: [place] Barriers to Discharge: No Barriers Identified   Patient Goals and CMS Choice     Choice offered to / list presented to : Patient(Charity [facility])  Discharge Placement                       Discharge Plan and Services In-house Referral: NA Discharge Planning Services: CM Consult, Follow-up appt scheduled, Medication Assistance Post Acute Care Choice: NA                    HH Arranged: RN [place] Agency: [place] Date [facility] Agency Contacted: 03/18/19 Time [place]: [place] Representative spoke with at [place]: [place] ([place]) Interventions     Readmission Risk Interventions No flowsheet data found.

## 2019-03-18 NOTE — Progress Notes (Signed)
[name] Kitchen  PROGRESS NOTE    [name]  LXB:[id number] DOB: November 30, 1980 DOA: 03/15/2019 PCP: Harlan Stains, MD   Brief Narrative:   Providence Crosby a 38 y.o.femalewith medical history significant ofleft axillar and other areasskin abscesses, right inner thigh cellulitis, asthma as a child, active smoker, skin cancer,anxiety, depression, type I diabetes, GERD, history of pneumonia, history of polycystic ovarian syndrome, history of supraventricular tachycardia, vitamin D deficiency who is coming to the emergency department due to not feeling well since yesterday evening with polydipsia, polyuria and hyperglycemia. She states that she vomited 3 times this morning and has had diarrhea 5-6 times already today. She mentions that she has been having frequent palpitations and mild lightheadedness. She has lower chest and upper abdomen discomfort. She also complains of developing an abscess on her mid right eye posterolateral aspect. She denies constipation, melena or hematochezia. No dysuria, frequency or hematuria. She denies fever, chills, sore throat, rhinorrhea, wheezing, hemoptysis, productive cough, dyspnea, diaphoresis, PND, orthopnea, but states she occasionally gets lower extremity edema.   Assessment & Plan:   Principal Problem:   DKA (diabetic ketoacidosis) ([place]) Active Problems:   Diabetes mellitus type I ([place])   Depression   Esophageal reflux   Abscess of right thigh   DKA/Diabetes mellitus type I - IV hydration/IV insulin infusion; transition to SQ insulin - Monitor CBG hourly/BMP every 4 hours. - Replace electrolytes as needed. - glucose improved, gap closed; transition to SQ insulin (60u lantus qday, SSI)     - transitioned to SQ insulin, monitor  Abscess of right thigh - Declined I&D in the ER. - Analgesics as needed. - Continue vancomycin per pharmacy; may consider change to clinda; if she continues to decline I&D, can consider  outpt follow up - She agreed to be seen by surgery for I&D; to go to OR today     - she can not take clinda or bactrim; she is currently on doxy 100mg  BID chronically; after I&D, will continue vanc     - now s/p I&D; awaiting final surgical recs     - let's continue vanc while she's inpt; will transition back to doxy 100mg  BID at discahrge  Depression - Continue Seroquel 50-100 mg p.o. bedtime; Viibryd 40 mg p.o. daily.  Esophageal reflux - Protonix 40 mg IVP x1 given. - protonix PO  Paroxysmal SVT (supraventricular tachycardia) ([facility]) - Continue IV hydration. - Keep electrolytes optimized.  Will d/c to home when cleared by surgery.  DVT prophylaxis:lovenox Code Status:FULL Disposition Plan:TBD   Consultants:   General Surgery  Procedures:   Right thigh abscess I&D  Antimicrobials:  [name] Kitchen Vanc 1g IV qday    Subjective: "It's sore, but it's better."  Objective: Vitals:   03/17/19 1939 03/17/19 1950 03/17/19 2000 03/18/19 0600  BP: 110/71   98/62  Pulse:    85  Resp: 13  20   Temp:  98.2 F (36.8 C)  98.5 F (36.9 C)  TempSrc:  Oral  Oral  SpO2: 96%   95%  Weight:    102.7 kg  Height:        Intake/Output Summary (Last 24 hours) at 03/18/2019 1003 Last data filed at 03/18/2019 0400 Gross per 24 hour  Intake 2980.38 ml  Output 20 ml  Net 2960.38 ml   Filed Weights   03/17/19 1425 03/17/19 1430 03/18/19 0600  Weight: 99.9 kg 99.9 kg 102.7 kg    Examination:  General:37 y.o.femaleresting in bed in NAD Cardiovascular: RRR, +S1, S2, no  m/g/r, equal pulses throughout Respiratory: CTABL, no w/r/r, normal WOB GI: BS+, NDNT, no masses noted, no organomegaly noted MSK: No e/c/c Skin:right thigh erythema, edema Neuro: A&O x 3, no focal deficits   Data Reviewed: I have personally reviewed following labs and imaging studies.  CBC: Recent Labs  Lab 03/14/19 0255 03/15/19 1704 03/16/19 0645 03/17/19 1042 03/18/19 0504   WBC 10.9* 12.7* 11.3* 9.3 10.8*  NEUTROABS 6.6  --   --  5.7 7.7  HGB 14.9 15.5* 12.6 12.1 11.3*  HCT 44.0 47.6* 39.1 36.7 34.5*  MCV 95.9 99.0 97.3 96.1 96.9  PLT 341 341 282 251 811   Basic Metabolic Panel: Recent Labs  Lab 03/15/19 1837 03/16/19 0142 03/16/19 0645 03/16/19 1413 03/17/19 1042 03/18/19 0504  NA 135 132* 136 135 138 137  K 5.1 3.9 4.0 3.7 3.8 3.4*  CL 104 107 109 109 110 107  CO2 8* 16* 18* 20* 22 22  GLUCOSE 462* 208* 124* 188* 155* 178*  BUN 17 13 9 6  5* 5*  CREATININE 1.16* 0.87 0.65 0.68 0.54 0.77  CALCIUM 9.0 7.8* 8.1* 8.3* 8.1* 7.9*  MG 1.9  --   --   --   --  1.7  PHOS  --   --   --  2.1* 3.4 2.9   GFR: Estimated Creatinine Clearance: 112.3 mL/min (by C-G formula based on SCr of 0.77 mg/dL). Liver Function Tests: Recent Labs  Lab 03/14/19 0255 03/16/19 1413 03/17/19 1042 03/18/19 0504  AST 13*  --   --   --   ALT 13  --   --   --   ALKPHOS 93  --   --   --   BILITOT 0.7  --   --   --   PROT 6.4*  --   --   --   ALBUMIN 3.5 2.8* 2.5* 2.2*   No results for input(s): LIPASE, AMYLASE in the last 168 hours. No results for input(s): AMMONIA in the last 168 hours. Coagulation Profile: No results for input(s): INR, PROTIME in the last 168 hours. Cardiac Enzymes: No results for input(s): CKTOTAL, CKMB, CKMBINDEX, TROPONINI in the last 168 hours. BNP (last 3 results) No results for input(s): PROBNP in the last 8760 hours. HbA1C: No results for input(s): HGBA1C in the last 72 hours. CBG: Recent Labs  Lab 03/17/19 1230 03/17/19 1402 03/17/19 1629 03/17/19 2150 03/18/19 0813  GLUCAP 125* 131* 84 184* 112*   Lipid Profile: No results for input(s): CHOL, HDL, LDLCALC, TRIG, CHOLHDL, LDLDIRECT in the last 72 hours. Thyroid Function Tests: No results for input(s): TSH, T4TOTAL, FREET4, T3FREE, THYROIDAB in the last 72 hours. Anemia Panel: No results for input(s): VITAMINB12, FOLATE, FERRITIN, TIBC, IRON, RETICCTPCT in the last 72 hours.  Sepsis Labs: No results for input(s): PROCALCITON, LATICACIDVEN in the last 168 hours.  Recent Results (from the past 240 hour(s))  Novel Coronavirus,NAA,(SEND-OUT TO REF LAB - TAT 24-48 hrs); Hosp Order     Status: None   Collection Time: 03/15/19  7:28 PM   Specimen: Nasopharyngeal Swab; Respiratory  Result Value Ref Range Status   SARS-CoV-2, NAA NOT DETECTED NOT DETECTED Final    Comment: (NOTE) This test was developed and its performance characteristics determined by [employer]. This test has not been FDA cleared or approved. This test has been authorized by FDA under an Emergency Use Authorization (EUA). This test is only authorized for the duration of time the declaration that circumstances exist justifying the authorization of  the emergency use of in vitro diagnostic tests for detection of SARS-CoV-2 virus and/or diagnosis of COVID-19 infection under section 564(b)(1) of the Act, 21 U.S.C. 151VOH-6(W)(7), unless the authorization is terminated or revoked sooner. When diagnostic testing is negative, the possibility of a false negative result should be considered in the context of a patient's recent exposures and the presence of clinical signs and symptoms consistent with COVID-19. An individual without symptoms of COVID-19 and who is not shedding SARS-CoV-2 virus would expect to have a negative (not detected) result in this assay. Performed  At: [facility] [address] [place], [place] [id number] Rush Farmer MD WN:[id number]    Alligator  Final    Comment: Performed at [facility], [place] [address] [place], [place] [address]  MRSA PCR Screening     Status: None   Collection Time: 03/15/19  8:44 PM   Specimen: Nasopharyngeal  Result Value Ref Range Status   MRSA by PCR NEGATIVE NEGATIVE Final    Comment:        The GeneXpert MRSA Assay (FDA approved for NASAL specimens only), is one component of a comprehensive  MRSA colonization surveillance program. It is not intended to diagnose MRSA infection nor to guide or monitor treatment for MRSA infections. Performed at [facility], [place] [address], [place], [place] [address]   SARS Coronavirus 2 (CEPHEID - Performed in [place] hospital lab), Hosp Order     Status: None   Collection Time: 03/17/19 11:50 AM   Specimen: Nasopharyngeal Swab  Result Value Ref Range Status   SARS Coronavirus 2 NEGATIVE NEGATIVE Final    Comment: (NOTE) If result is NEGATIVE SARS-CoV-2 target nucleic acids are NOT DETECTED. The SARS-CoV-2 RNA is generally detectable in upper and lower  respiratory specimens during the acute phase of infection. The lowest  concentration of SARS-CoV-2 viral copies this assay can detect is 250  copies / mL. A negative result does not preclude SARS-CoV-2 infection  and should not be used as the sole basis for treatment or other  patient management decisions.  A negative result may occur with  improper specimen collection / handling, submission of specimen other  than nasopharyngeal swab, presence of viral mutation(s) within the  areas targeted by this assay, and inadequate number of viral copies  (<250 copies / mL). A negative result must be combined with clinical  observations, patient history, and epidemiological information. If result is POSITIVE SARS-CoV-2 target nucleic acids are DETECTED. The SARS-CoV-2 RNA is generally detectable in upper and lower  respiratory specimens dur ing the acute phase of infection.  Positive  results are indicative of active infection with SARS-CoV-2.  Clinical  correlation with patient history and other diagnostic information is  necessary to determine patient infection status.  Positive results do  not rule out bacterial infection or co-infection with other viruses. If result is PRESUMPTIVE POSTIVE SARS-CoV-2 nucleic acids MAY BE PRESENT.   A presumptive positive result was obtained on the  submitted specimen  and confirmed on repeat testing.  While 2019 novel coronavirus  (SARS-CoV-2) nucleic acids may be present in the submitted sample  additional confirmatory testing may be necessary for epidemiological  and / or clinical management purposes  to differentiate between  SARS-CoV-2 and other Sarbecovirus currently known to infect humans.  If clinically indicated additional testing with an alternate test  methodology [phone]) is advised. The SARS-CoV-2 RNA is generally  detectable in upper and lower respiratory sp ecimens during the  acute  phase of infection. The expected result is Negative. Fact Sheet for Patients:  StrictlyIdeas.no Fact Sheet for Healthcare Providers: BankingDealers.co.za This test is not yet approved or cleared by the [place] FDA and has been authorized for detection and/or diagnosis of SARS-CoV-2 by FDA under an Emergency Use Authorization (EUA).  This EUA will remain in effect (meaning this test can be used) for the duration of the COVID-19 declaration under Section 564(b)(1) of the Act, 21 U.S.C. section 360bbb-3(b)(1), unless the authorization is terminated or revoked sooner. Performed at [facility], [place] [address], [place], [place] [address]   Aerobic/Anaerobic Culture (surgical/deep wound)     Status: None (Preliminary result)   Collection Time: 03/17/19  4:22 PM   Specimen: Wound; Abscess  Result Value Ref Range Status   Specimen Description ABSCESS RIGHT THIGH  Final   Special Requests NONE  Final   Gram Stain   Final    ABUNDANT WBC PRESENT, PREDOMINANTLY PMN FEW GRAM POSITIVE COCCI Performed at [facility], [address] [place], [place] [address]    Culture PENDING  Incomplete   Report Status PENDING  Incomplete      Radiology Studies: No results found.    Scheduled Meds: . acetaminophen  1,000 mg Oral Q6H  . enoxaparin (LOVENOX) injection  40 mg  Subcutaneous Daily  . insulin aspart  0-15 Units Subcutaneous TID WC  . insulin aspart  0-5 Units Subcutaneous QHS  . insulin glargine  60 Units Subcutaneous Daily  . lamoTRIgine  25 mg Oral Daily  . lidocaine-EPINEPHrine  10 mL Infiltration Once  . pantoprazole  40 mg Oral Daily  . QUEtiapine  50-100 mg Oral QHS  . spironolactone  100 mg Oral Daily  . Vilazodone HCl  40 mg Oral Daily   Continuous Infusions: . sodium chloride Stopped (03/18/19 0920)  . sodium chloride    . sodium chloride 10 mL/hr at 03/18/19 0920  . sodium chloride    . vancomycin 1,000 mg (03/17/19 1944)     LOS: 3 days    Time spent: 25 minutes spent in the coordination of care today.    Jonnie Finner, DO [facility] Pager [phone]  If 7PM-7AM, please contact night-coverage [email or web address] Password [email or web address] 03/18/2019, 10:03 AM

## 2019-03-18 NOTE — Progress Notes (Signed)
Pharmacy Antibiotic Note  [name] is a 38 y.o. female admitted on 03/15/2019 with R thigh abscess with surrounding cellulitis. Pt started on IV vancomycin and now s/p I&D 6/25. 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 Cr has remained relatively stable ~0.7.    Plan: Continue vancomycin 1000mg  IV q24h for now Will defer vancomycin levels as MD planning to switch to PO ABX today    Temp (24hrs), Avg:98.1 F (36.7 C), Min:97.2 F (36.2 C), Max:98.5 F (36.9 C)  Recent Labs  Lab 03/14/19 0255 03/15/19 1704 03/15/19 1837 03/16/19 0142 03/16/19 0645 03/16/19 1413 03/17/19 1042 03/18/19 0504  WBC 10.9* 12.7*  --   --  11.3*  --  9.3 10.8*  CREATININE 0.75 1.34* 1.16* 0.87 0.65 0.68 0.54  --     Estimated Creatinine Clearance: 112.3 mL/min (by C-G formula based on SCr of 0.54 mg/dL).    Allergies  Allergen Reactions  . Latex Hives, Shortness Of Breath and Rash  . Levofloxacin Shortness Of Breath and Rash  . Moxifloxacin Shortness Of Breath and Rash  . Oxycodone-Acetaminophen Shortness Of Breath, Swelling and Rash    NORCO/VICODIN OK  . Peach [Prunus Persica] Hives and Shortness Of Breath  . Potassium-Containing Compounds Other (See Comments)    IV ROUTE - CAUSES VEINS TO COLLAPS; Reports that it is undiluted K only  . Prednisone Other (See Comments)    SEVERE ELEVATION OF BLOOD SUGAR. Able to tolerate 40 mg  . Propoxyphene N-Acetaminophen Swelling    SWELLING OF FACE AND THROAT  . Rosiglitazone Maleate Swelling    SWELLING OF FACE AND LEGS  . Xolair [Omalizumab] Other (See Comments)    Rash and anaphylaxis  . Adhesive [Tape] Hives, Itching and Rash  . Morphine And Related Other (See Comments)    Causes hallucinations  . Prozac [Fluoxetine Hcl] Other (See Comments)    Made her very aggressive   . Chantix [Varenicline]     dreams  . Citrullus Vulgaris Nausea And Vomiting    Facial swelling  .  Clindamycin/Lincomycin   . Humira [Adalimumab]     Does not remember   . Septra [Sulfamethoxazole-Trimethoprim]   . Cefaclor Rash  . Keflex [Cephalexin] Diarrhea and Rash    REACTION: severe migraine  . Promethazine Hcl Other (See Comments)    IV ROUTE ONLY - JITTERY FEELING. Patient reports that it is mild and she has used promethazine since then  PO tablet ok  . Sulfadiazine Rash    Antimicrobials this admission: Vancomycin 6/23 >>>  Thank you for involving pharmacy in this patient's care.   Arrie Senate, PharmD, BCPS Clinical Pharmacist [phone] Please check AMION for all [place] numbers 03/18/2019

## 2019-03-18 NOTE — Discharge Summary (Signed)
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 PRESENT, PREDOMINANTLY PMN FEW GRAM POSITIVE COCCI Performed at [facility], [address] [place], [place] [address]    Culture PENDING  Incomplete   Report Status PENDING  Incomplete     Time coordinating discharge and care today: 35 minutes  SIGNED:   Jonnie Finner, DO  [facility] 03/18/2019, 10:57 AM Pager   If 7PM-7AM, please contact  night-coverage [email or web address] Password [email or web address]

## 2019-03-18 NOTE — TOC Transition Note (Signed)
Transition of Care [facility]) - CM/SW Discharge Note   Patient Details  Name: [name] MRN: [id number] Date of Birth: 1981-05-28  [facility] CM/SW Contact:  Bethena Roys, RN Phone Number: 03/18/2019, 2:07 PM   Clinical Narrative:   Patient lives in [place] Medicaid to be approved. Patient agreeable to have an appointment set up at the [facility]- pt can use pharmacy cost range from $4.00-$10.00. Appointment placed on AVS. No further transition of care needs at this time.    Final next level of care: Home/Self Care Barriers to Discharge: Continued Medical Work up   Patient Goals and CMS Choice     Choice offered to / list presented to : NA   Discharge Plan and Services In-house Referral: NA Discharge Planning Services: CM Consult, Follow-up appt scheduled, Medication Assistance Post Acute Care Choice: NA             Social Determinants of Health (SDOH) Interventions     Readmission Risk Interventions No flowsheet data found.

## 2019-03-19 NOTE — Progress Notes (Signed)
Culture data noted. Spoke with ID about abx options. Rec'd linezolid 600mg  BID x 7 days. Spoke with patient by phone. Informed her of situation. Rx called to [facility] 03/19/2019 1130hrs. Patient instructed to hold medicine and see ER/PCP if dyspnea or rash develop after starting medication. She voiced understanding.   Jonnie Finner, DO

## 2019-03-21 ENCOUNTER — Other Ambulatory Visit: Payer: Self-pay | Admitting: *Deleted

## 2019-03-21 NOTE — Patient Outreach (Addendum)
[facility]) Care Management  03/21/2019  [name] 02-24-1981 [id number]  Transition of care telephone call  Referral received: 03/16/19 Initial outreach: 03/21/19 Insurance: [employer]  Initial unsuccessful telephone call to patient's preferred (mobile) number in order to complete transition of care assessment; no answer, left HIPAA compliant voicemail message requesting return call.   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 She had incision and drainage of the abscess on 03/17/19.  Comorbidities include: paroxysmal SVT, TIA, Asthma, allergic rhinitis, GERD, Type 1 DM, polycystic ovarian syndrome, hidradenitis suppurativa, anxiety, depression, migraines, tobacco abuse, Vit D deficiency, bipolar disorder  She was discharged to home on 02/2619 and the discharge summary indicated [name] was contacted for home health RN services for wound care.   Plan: This RNCM will route unsuccessful outreach letter with [facility] pamphlet and 24 hour Nurse Advice Line Magnet to [facility] clinical pool to be mailed to patient's home address. This RNCM will attempt another outreach within 4 business days.  Barrington Ellison RN,CCM,CDE [place] Management Coordinator Office Phone [phone] Office Fax [phone]

## 2019-03-22 ENCOUNTER — Ambulatory Visit (INDEPENDENT_AMBULATORY_CARE_PROVIDER_SITE_OTHER): Payer: Medicaid Other | Admitting: Psychiatry

## 2019-03-22 ENCOUNTER — Other Ambulatory Visit: Payer: Self-pay

## 2019-03-22 ENCOUNTER — Other Ambulatory Visit (HOSPITAL_COMMUNITY): Payer: Self-pay

## 2019-03-22 DIAGNOSIS — F410 Panic disorder [episodic paroxysmal anxiety] without agoraphobia: Secondary | ICD-10-CM

## 2019-03-22 DIAGNOSIS — F3181 Bipolar II disorder: Secondary | ICD-10-CM

## 2019-03-22 DIAGNOSIS — F4312 Post-traumatic stress disorder, chronic: Secondary | ICD-10-CM

## 2019-03-22 LAB — AEROBIC/ANAEROBIC CULTURE W GRAM STAIN (SURGICAL/DEEP WOUND)

## 2019-03-22 MED ORDER — LAMOTRIGINE 100 MG PO TABS: 100.0000 mg | ORAL_TABLET | Freq: Every day | ORAL | 0 refills | Status: DC

## 2019-03-22 MED ORDER — CLONAZEPAM 0.5 MG PO TABS: 0.5000 mg | ORAL_TABLET | Freq: Two times a day (BID) | ORAL | 0 refills | Status: DC | PRN

## 2019-03-22 MED ORDER — PRAZOSIN HCL 2 MG PO CAPS: 2.0000 mg | ORAL_CAPSULE | Freq: Every day | ORAL | 1 refills | Status: DC

## 2019-03-22 MED ORDER — VORTIOXETINE HBR 10 MG PO TABS: 10.0000 mg | ORAL_TABLET | Freq: Every day | ORAL | 0 refills | Status: DC

## 2019-03-22 MED ORDER — QUETIAPINE FUMARATE 50 MG PO TABS: 50.0000 mg | ORAL_TABLET | Freq: Every day | ORAL | 1 refills | Status: DC
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
## 2019-03-24 ENCOUNTER — Ambulatory Visit (INDEPENDENT_AMBULATORY_CARE_PROVIDER_SITE_OTHER): Payer: Self-pay | Admitting: Psychiatry

## 2019-03-24 ENCOUNTER — Other Ambulatory Visit: Payer: Self-pay | Admitting: *Deleted

## 2019-03-24 ENCOUNTER — Encounter (HOSPITAL_COMMUNITY): Payer: Self-pay | Admitting: Psychiatry

## 2019-03-24 ENCOUNTER — Other Ambulatory Visit: Payer: Self-pay

## 2019-03-24 ENCOUNTER — Encounter: Payer: Self-pay | Admitting: *Deleted

## 2019-03-24 DIAGNOSIS — F410 Panic disorder [episodic paroxysmal anxiety] without agoraphobia: Secondary | ICD-10-CM

## 2019-03-24 DIAGNOSIS — F3181 Bipolar II disorder: Secondary | ICD-10-CM

## 2019-03-24 DIAGNOSIS — F4312 Post-traumatic stress disorder, chronic: Secondary | ICD-10-CM
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
## 2019-04-04 ENCOUNTER — Other Ambulatory Visit: Payer: Self-pay

## 2019-04-04 ENCOUNTER — Encounter: Payer: Self-pay | Admitting: Family Medicine

## 2019-04-04 ENCOUNTER — Ambulatory Visit: Payer: Self-pay | Attending: Family Medicine | Admitting: Family Medicine

## 2019-04-04 VITALS — BP 121/85 | HR 112 | Temp 98.5°F | Ht 64.0 in | Wt 208.0 lb

## 2019-04-04 DIAGNOSIS — K219 Gastro-esophageal reflux disease without esophagitis: Secondary | ICD-10-CM

## 2019-04-04 DIAGNOSIS — E1065 Type 1 diabetes mellitus with hyperglycemia: Secondary | ICD-10-CM

## 2019-04-04 LAB — GLUCOSE, POCT (MANUAL RESULT ENTRY): POC Glucose: 242 mg/dL — AB (ref 70–99)

## 2019-04-04 MED ORDER — PANTOPRAZOLE SODIUM 40 MG PO TBEC: 40.0000 mg | DELAYED_RELEASE_TABLET | Freq: Every day | ORAL | 1 refills | Status: DC

## 2019-04-04 MED ORDER — INSULIN GLARGINE 100 UNIT/ML ~~LOC~~ SOLN: SUBCUTANEOUS | 3 refills | Status: DC

## 2019-04-04 MED ORDER — HUMALOG 100 UNIT/ML ~~LOC~~ SOLN: SUBCUTANEOUS | 11 refills | Status: DC

## 2019-04-04 NOTE — Progress Notes (Signed)
Hospital follow up and had a DKA

## 2019-04-04 NOTE — Progress Notes (Signed)
Patient ID: [name], female    DOB: 10/17/1980  MRN: [id number]   SUBJECTIVE:  [name] is a 38 y.o. female who presents for hospital f/u. Where: [facility] When:03/15/2019-03/18/2019 Primary Dx: Type 1 diabetes with diabetic ketoacidosis; abscess of right thigh  History of paroxysmal SVT, GERD, anxiety and depression  HPI: 38 yo female who presents in follow-up of recent hospitalization as well as the need for medication for the continued treatment of her diabetes.  She reports that she had the onset of anxiety over several life stressors including a divorce and 1 day when she pulled into the parking lot to go to work, she could not get out of her car secondary to crying and anxiety.  She reports that she did not have insurance coverage after leaving her job and was questioning her insulin for treatment of her diabetes which caused her to develop a abscess in her right thigh for which she presented to the emergency department on 03/15/2019 as she had started having nausea, vomiting, abdominal pain, urinary frequency and increased thirst and not feeling well in general.  She also has a history of SVT and had begun to have episodes of increased heart rate prior to her hospital presentation.       She reports that she will soon be starting another job and she feels that her anxiety is now controlled.  She was given prescriptions at discharge for treatment of her blood sugars which are now better controlled however she is still uninsured and needs to establish care in order to obtain continued medication for treatment of diabetes.  She reports that she has had no further issues with the abscess on her thigh as she states that this resolved after she had incision and drainage of the site and it is now healed.  She does continue to see a counselor and received treatment for anxiety and depression.  She states that her blood sugars are usually now in the low 100s fasting.  She has eaten  prior to today's visit and her blood sugars were slightly higher.  She has had no further issues with sensation of increased heart rate, no chest pain, no sensation of palpitations, no shortness of breath or cough.  She denies any nausea/vomiting/diarrhea or constipation, no abdominal pain.  She denies any increased thirst, no urinary frequency and no blurred vision.  She was started on medication for acid reflux during her hospitalization and she does need a refill of the pantoprazole as she states that it is controlled her reflux symptoms.  Patient Active Problem List   Diagnosis Date Noted  . DKA (diabetic ketoacidosis) ([place]) 03/15/2019  . Abscess of right thigh 03/15/2019  . Bipolar 2 disorder, major depressive episode ([place]) 01/04/2019  . Panic disorder 01/04/2019  . Enteritis 12/24/2017  . Intractable nausea and vomiting 12/24/2017  . Epigastric pain   . Other chest pain 09/28/2017  . TIA (transient ischemic attack) 09/28/2017  . Paroxysmal SVT (supraventricular tachycardia) ([place]) 06/24/2017  . Dizziness 06/24/2017  . Weakness 06/24/2017  . Hyperglycemia 03/17/2016  . Abscess 03/17/2016  . Chest discomfort 07/10/2014  . Cellulitis 06/01/2014  . Cellulitis of right thigh 06/01/2014  . Postop check 01/19/2014  . Smoking addiction 08/29/2013  . DKA, type 1 ([place]) 05/09/2013  . Hidradenitis suppurativa 04/21/2012  . Esophageal reflux 11/17/2011  . Seasonal and perennial allergic rhinitis 05/20/2010  . BRONCHITIS, ACUTE 11/29/2007  . HYPONATREMIA 11/14/2007  . ANXIETY STATE, UNSPECIFIED 10/24/2007  .  Sinus tachycardia 07/07/2007  . Diabetes mellitus type I ([place]) 04/08/2007  . Smoker 04/08/2007  . 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 MIGRAINES, HX OF 04/08/2007     Current Outpatient Medications on File Prior to Visit  Medication Sig Dispense Refill  . acetaminophen (TYLENOL) 500 MG tablet Take 2 tablets (1,000 mg total) by mouth every 6 (six) hours as needed for mild pain or  fever.    . clonazePAM (KLONOPIN) 0.5 MG tablet Take 1 tablet (0.5 mg total) by mouth 2 (two) times daily as needed for anxiety. 60 tablet 0  . doxycycline (VIBRA-TABS) 100 MG tablet TAKE 1 TABLET BY MOUTH 2 TIMES DAILY. (Patient taking differently: Take 100 mg by mouth 2 (two) times daily. ) 14 tablet prn  . glucose blood ([employer] TEST STRIPS) test strip 1 each by Other route See admin instructions.     Marland Kitchen HUMALOG 100 UNIT/ML injection Inject 1-10 Units into the skin See admin instructions. Sliding scale 1-10units on the correction and 1-7units with carbs    . insulin glargine (LANTUS) 100 UNIT/ML injection INJECT 60 UNITS UNDER THE SKIN ONCE DAILY AS DIRECTED (Patient taking differently: Inject 60 Units into the skin daily. ) 60 mL 3  . lamoTRIgine (LAMICTAL) 100 MG tablet Take 1 tablet (100 mg total) by mouth daily. 30 tablet 0  . levonorgestrel (MIRENA) 20 MCG/24HR IUD 1 each by Intrauterine route once. Placed 04/2013    . mupirocin ointment (BACTROBAN) 2 % Apply 1 application topically daily as needed.  2  . pantoprazole (PROTONIX) 40 MG tablet Take 1 tablet (40 mg total) by mouth daily. 90 tablet 4  . prazosin (MINIPRESS) 2 MG capsule Take 1 capsule (2 mg total) by mouth at bedtime. 30 capsule 1  . QUEtiapine (SEROQUEL) 50 MG tablet Take 1-2 tablets (50-100 mg total) by mouth at bedtime. 60 tablet 1  . spironolactone (ALDACTONE) 100 MG tablet Take 1 tablet (100 mg total) by mouth daily. 30 tablet 3  . traMADol (ULTRAM) 50 MG tablet Take 1 tablet (50 mg total) by mouth every 6 (six) hours as needed for moderate pain. 30 tablet 0  . vortioxetine HBr (TRINTELLIX) 10 MG TABS tablet Take 1 tablet (10 mg total) by mouth daily. 30 tablet 0   No current facility-administered medications on file prior to visit.     Allergies  Allergen Reactions  . Latex Hives, Shortness Of Breath and Rash  . Levofloxacin Shortness Of Breath and Rash  . Moxifloxacin Shortness Of Breath and Rash  .  Oxycodone-Acetaminophen Shortness Of Breath, Swelling and Rash    NORCO/VICODIN OK  . Peach [Prunus Persica] Hives and Shortness Of Breath  . Potassium-Containing Compounds Other (See Comments)    IV ROUTE - CAUSES VEINS TO COLLAPS; Reports that it is undiluted K only  . Prednisone Other (See Comments)    SEVERE ELEVATION OF BLOOD SUGAR. Able to tolerate 40 mg  . Propoxyphene N-Acetaminophen Swelling    SWELLING OF FACE AND THROAT  . Rosiglitazone Maleate Swelling    SWELLING OF FACE AND LEGS  . Xolair [Omalizumab] Other (See Comments)    Rash and anaphylaxis  . Adhesive [Tape] Hives, Itching and Rash  . Morphine And Related Other (See Comments)    Causes hallucinations  . Prozac [Fluoxetine Hcl] Other (See Comments)    Made her very aggressive   . Chantix [Varenicline]     dreams  . Citrullus Vulgaris Nausea And Vomiting    Facial swelling  . Clindamycin/Lincomycin   .  Humira [Adalimumab]     Does not remember   . Septra [Sulfamethoxazole-Trimethoprim]   . Cefaclor Rash  . Keflex [Cephalexin] Diarrhea and Rash    REACTION: severe migraine  . Promethazine Hcl Other (See Comments)    IV ROUTE ONLY - JITTERY FEELING. Patient reports that it is mild and she has used promethazine since then  PO tablet ok  . Sulfadiazine Rash    Social History   Tobacco Use  . Smoking status: Current Every Day Smoker    Packs/day: 1.00    Years: 23.00    Pack years: 23.00    Types: Cigarettes  . Smokeless tobacco: Never Used  . Tobacco comment: Has cut back to 1/2 to 3/4 and wants to begin useing patches again once anxiety subsides  Substance Use Topics  . Alcohol use: No    Alcohol/week: 0.0 standard drinks  . Drug use: Yes    Types: Marijuana    Comment: Last use 3 weeks prior      Family History  Problem Relation Age of Onset  . Cancer Mother   . Anxiety disorder Mother   . Depression Mother   . Sexual abuse Mother   . Hyperlipidemia Sister   . Hypertension Sister   .  Sexual abuse Sister   . Hypertension Father   . Anxiety disorder Father   . Depression Father   . Drug abuse Father   . Stroke Paternal Uncle   . ADD / ADHD Paternal Uncle   . Anxiety disorder Paternal Uncle   . Anxiety disorder Maternal Aunt   . Seizures Maternal Aunt   . Anxiety disorder Paternal Aunt   . Anxiety disorder Maternal Uncle   . ADD / ADHD Cousin   . ADD / ADHD Daughter     Past Surgical History:  Procedure Laterality Date  . BREAST SURGERY     right lumpectomy  . BUNIONECTOMY     left  . CARDIAC CATHETERIZATION    . CHOLECYSTECTOMY  12/02/2006   lap. chole.  Marland Kitchen DILATION AND EVACUATION  02/14/2007  . ESOPHAGOGASTRODUODENOSCOPY  11/17/2011   Procedure: ESOPHAGOGASTRODUODENOSCOPY (EGD);  Surgeon: [name], MD;  Location: [facility];  Service: Endoscopy;  Laterality: N/A;  . EYE SURGERY     exc. stye left eye  . HYDRADENITIS EXCISION  04/30/2012   Procedure: EXCISION HYDRADENITIS GROIN;  Surgeon: [name], MD;  Location: [place];  Service: General;  Laterality: Left;  wide excision hidradenitis bilateral thighs and Left groin  . HYDRADENITIS EXCISION Left 01/13/2014   Procedure: WIDE EXCISION HIDRADENITIS LEFT AXILLA;  Surgeon: [name], MD;  Location: [place];  Service: General;  Laterality: Left;  . INCISION AND DRAINAGE ABSCESS Right 03/17/2019   Procedure: INCISION AND DRAINAGE RIGHT THIGH ABSCESS;  Surgeon: [name], MD;  Location: [place];  Service: General;  Laterality: Right;  . IRRIGATION AND DEBRIDEMENT ABSCESS Right 06/02/2014   Procedure: IRRIGATION AND DEBRIDEMENT ABSCESS;  Surgeon: [name], MD;  Location: [place];  Service: General;  Laterality: Right;  . IRRIGATION AND DEBRIDEMENT ABSCESS Left 09/23/2014   Procedure: IRRIGATION AND DEBRIDEMENT ABSCESS/LEFT THIGH;  Surgeon: [name], MD;  Location: [place];  Service: General;  Laterality: Left;  . LEFT HEART CATHETERIZATION WITH CORONARY ANGIOGRAM  N/A 07/14/2014   Procedure: LEFT HEART CATHETERIZATION WITH CORONARY ANGIOGRAM;  Surgeon: [name], MD;  [facility];  Service: Cardiovascular;  Laterality: N/A;  . TONSILLECTOMY    . WISDOM  TOOTH EXTRACTION      ROS: Review of Systems  Constitutional: Positive for fatigue (mild). Negative for chills and fever.  Eyes: Negative for photophobia and visual disturbance.  Respiratory: Negative for cough and shortness of breath.   Cardiovascular: Negative for chest pain, palpitations and leg swelling.  Gastrointestinal: Negative for abdominal pain, constipation, diarrhea and nausea.  Endocrine: Negative for polydipsia, polyphagia and polyuria.  Genitourinary: Negative for dysuria and frequency.  Musculoskeletal: Negative for arthralgias and back pain.  Skin: Negative for rash and wound.  Neurological: Negative for dizziness and headaches.  Hematological: Negative for adenopathy. Does not bruise/bleed easily.     PHYSICAL EXAM: BP 121/85 (BP Location: Right Arm, Patient Position: Sitting, Cuff Size: Large)   Pulse (!) 112   Temp 98.5 F (36.9 C) (Oral)   Ht 5\' 4"  (1.626 m)   Wt 208 lb (94.3 kg)   SpO2 97%   BMI 35.70 kg/m   Nurses notes and vital signs reviewed General-well-nourished, well-developed overweight for height female in no acute distress.  Patient is accompanied by her young daughter. Neck-supple, no lymphadenopathy Lungs-clear to auscultation bilaterally Cardiovascular- regular rate and rhythm Lungs- clear to auscultation bilaterally Back-no CVA tenderness Abdomen-soft, nontender Back-no CVA tenderness Diabetic foot exam-no active skin breakdown on the feet, good nail care, normal monofilament exam on 10 out of 10 areas tested.  1+ dorsalis pedis and posterior tibial pulses Extremities-no edema Psych-normal mood and judgment    ASSESSMENT AND PLAN: 1. Type 1 diabetes mellitus with hyperglycemia ([place]) Patient was referred to medical social worker  who was able to meet with the patient at today's visit as patient needs resources regarding obtaining medications and mental health follow-up due to her current lack of insurance.  Patient had random glucose at today's visit and will have BMP.  She is given prescriptions to continue her Lantus as well as Humalog and should be able to obtain samples at today's visit - Glucose (CBG) - Basic Metabolic Panel - Ambulatory referral to Social Work - insulin glargine (LANTUS) 100 UNIT/ML injection; INJECT 60 UNITS UNDER THE SKIN ONCE DAILY AS DIRECTED  Dispense: 60 mL; Refill: 3 - HUMALOG 100 UNIT/ML injection; 20-22 units pre-meal as per sliding scale  Dispense: 10 mL; Refill: 11  2. Gastroesophageal reflux disease, esophagitis presence not specified Refill provided of pantoprazole and patient encouraged to avoid spicy/greasy foods as well as avoidance of late night eating - pantoprazole (PROTONIX) 40 MG tablet; Take 1 tablet (40 mg total) by mouth daily.  Dispense: 90 tablet; Refill: 1  Future Appointments  Date Time Provider [place]  04/21/2019  9:00 AM Pucilowski, Marchia Bond, MD [facility] None   An After Visit Summary was printed and given to the patient.  Return in about 3 months (around 07/05/2019), or if symptoms worsen or fail to improve, for DM.  Antony Blackbird, MD, Rosalita Chessman

## 2019-04-05 LAB — BASIC METABOLIC PANEL WITH GFR
BUN/Creatinine Ratio: 20 (ref 9–23)
BUN: 16 mg/dL (ref 6–20)
CO2: 21 mmol/L (ref 20–29)
Calcium: 9.9 mg/dL (ref 8.7–10.2)
Chloride: 101 mmol/L (ref 96–106)
Creatinine, Ser: 0.79 mg/dL (ref 0.57–1.00)
GFR calc Af Amer: 111 mL/min/1.73
GFR calc non Af Amer: 96 mL/min/1.73
Glucose: 160 mg/dL — ABNORMAL HIGH (ref 65–99)
Potassium: 4.2 mmol/L (ref 3.5–5.2)
Sodium: 142 mmol/L (ref 134–144)

## 2019-04-12 ENCOUNTER — Telehealth: Payer: Self-pay | Admitting: Family Medicine

## 2019-04-12 ENCOUNTER — Telehealth: Payer: Self-pay | Admitting: Emergency Medicine

## 2019-04-12 ENCOUNTER — Telehealth: Payer: Self-pay | Admitting: Licensed Clinical Social Worker

## 2019-04-12 NOTE — Telephone Encounter (Signed)
Nurse called the patient's home phone number but received no answer and message was left on the voicemail for the patient to call back.  Return phone number given. 

## 2019-04-12 NOTE — Telephone Encounter (Signed)
Patient called wanting to get her results. Please follow up °

## 2019-04-12 NOTE — Telephone Encounter (Signed)
Call placed to patient to follow up on consult to address behavioral health and/or resource needs. Pt shared that she is out of town and has bad reception. She did receive voice message from RN regarding labwork  Pt will return home in two days. She requested that RN and LCSW follow up with her Thursday evening or Friday to go over results and consult.

## 2019-04-21 ENCOUNTER — Other Ambulatory Visit: Payer: Self-pay

## 2019-04-21 ENCOUNTER — Ambulatory Visit (INDEPENDENT_AMBULATORY_CARE_PROVIDER_SITE_OTHER): Payer: Medicaid Other | Admitting: Psychiatry

## 2019-04-21 DIAGNOSIS — F3181 Bipolar II disorder: Secondary | ICD-10-CM

## 2019-04-21 DIAGNOSIS — F4310 Post-traumatic stress disorder, unspecified: Secondary | ICD-10-CM

## 2019-04-21 DIAGNOSIS — F4312 Post-traumatic stress disorder, chronic: Secondary | ICD-10-CM | POA: Insufficient documentation

## 2019-04-21 DIAGNOSIS — F410 Panic disorder [episodic paroxysmal anxiety] without agoraphobia: Secondary | ICD-10-CM

## 2019-04-21 MED ORDER — CLONAZEPAM 0.5 MG PO TABS: 0.5000 mg | ORAL_TABLET | Freq: Two times a day (BID) | ORAL | 0 refills | Status: DC | PRN

## 2019-04-21 MED ORDER — LAMOTRIGINE 150 MG PO TABS: 150.0000 mg | ORAL_TABLET | Freq: Every day | ORAL | 0 refills | Status: DC
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
## 2019-05-02 ENCOUNTER — Other Ambulatory Visit (HOSPITAL_COMMUNITY): Payer: Self-pay | Admitting: Psychiatry

## 2019-05-02 ENCOUNTER — Telehealth (HOSPITAL_COMMUNITY): Payer: Self-pay

## 2019-05-02 DIAGNOSIS — F410 Panic disorder [episodic paroxysmal anxiety] without agoraphobia: Secondary | ICD-10-CM

## 2019-05-02 DIAGNOSIS — F3181 Bipolar II disorder: Secondary | ICD-10-CM

## 2019-05-02 DIAGNOSIS — F4312 Post-traumatic stress disorder, chronic: Secondary | ICD-10-CM

## 2019-05-02 MED ORDER — QUETIAPINE FUMARATE 50 MG PO TABS: 50.0000 mg | ORAL_TABLET | Freq: Every day | ORAL | 1 refills | Status: DC

## 2019-05-02 MED ORDER — CLONAZEPAM 0.5 MG PO TABS: 0.5000 mg | ORAL_TABLET | Freq: Two times a day (BID) | ORAL | 0 refills | Status: DC | PRN

## 2019-05-02 MED ORDER — PRAZOSIN HCL 2 MG PO CAPS: 2.0000 mg | ORAL_CAPSULE | Freq: Every day | ORAL | 1 refills | Status: DC

## 2019-05-02 MED ORDER — LAMOTRIGINE 150 MG PO TABS: 150.0000 mg | ORAL_TABLET | Freq: Every day | ORAL | 0 refills | Status: DC

## 2019-05-02 NOTE — Telephone Encounter (Signed)
Done

## 2019-05-02 NOTE — Telephone Encounter (Signed)
Patient called and would like all of her medication sent to [facility] in [place], I have updated the pharmacy in the chart. Please review

## 2019-05-05 ENCOUNTER — Other Ambulatory Visit (HOSPITAL_COMMUNITY): Payer: Self-pay

## 2019-05-05 MED ORDER — VORTIOXETINE HBR 10 MG PO TABS: 10.0000 mg | ORAL_TABLET | Freq: Every day | ORAL | 0 refills | Status: DC

## 2019-05-11 ENCOUNTER — Other Ambulatory Visit: Payer: Self-pay | Admitting: Internal Medicine

## 2019-05-11 MED ORDER — ONDANSETRON 8 MG PO TBDP: 8.0000 mg | ORAL_TABLET | Freq: Three times a day (TID) | ORAL | 1 refills | Status: DC | PRN

## 2019-05-19 ENCOUNTER — Ambulatory Visit (INDEPENDENT_AMBULATORY_CARE_PROVIDER_SITE_OTHER): Payer: 59 | Admitting: Psychiatry

## 2019-05-19 ENCOUNTER — Other Ambulatory Visit: Payer: Self-pay

## 2019-05-19 DIAGNOSIS — F3181 Bipolar II disorder: Secondary | ICD-10-CM

## 2019-05-19 DIAGNOSIS — F4310 Post-traumatic stress disorder, unspecified: Secondary | ICD-10-CM

## 2019-05-19 DIAGNOSIS — F410 Panic disorder [episodic paroxysmal anxiety] without agoraphobia: Secondary | ICD-10-CM

## 2019-05-19 MED ORDER — CLONAZEPAM 0.5 MG PO TABS: 0.5000 mg | ORAL_TABLET | Freq: Two times a day (BID) | ORAL | 0 refills | Status: DC | PRN

## 2019-05-19 MED ORDER — LAMOTRIGINE 150 MG PO TABS: 150.0000 mg | ORAL_TABLET | Freq: Every day | ORAL | 0 refills | Status: DC
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
## 2019-05-25 ENCOUNTER — Other Ambulatory Visit (HOSPITAL_COMMUNITY): Payer: Self-pay | Admitting: Psychiatry

## 2019-05-25 DIAGNOSIS — F3181 Bipolar II disorder: Secondary | ICD-10-CM

## 2019-05-31 ENCOUNTER — Other Ambulatory Visit (HOSPITAL_COMMUNITY): Payer: Self-pay | Admitting: Psychiatry

## 2019-06-02 ENCOUNTER — Other Ambulatory Visit: Payer: Self-pay

## 2019-06-02 ENCOUNTER — Encounter (HOSPITAL_COMMUNITY): Payer: Self-pay | Admitting: Psychiatry

## 2019-06-02 ENCOUNTER — Ambulatory Visit (INDEPENDENT_AMBULATORY_CARE_PROVIDER_SITE_OTHER): Payer: Medicaid Other | Admitting: Psychiatry

## 2019-06-02 DIAGNOSIS — F3181 Bipolar II disorder: Secondary | ICD-10-CM | POA: Diagnosis not present

## 2019-06-02 DIAGNOSIS — F4310 Post-traumatic stress disorder, unspecified: Secondary | ICD-10-CM | POA: Diagnosis not present
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
## 2019-06-09 ENCOUNTER — Encounter (HOSPITAL_COMMUNITY): Payer: Self-pay | Admitting: Psychiatry

## 2019-06-09 ENCOUNTER — Other Ambulatory Visit: Payer: Self-pay

## 2019-06-09 ENCOUNTER — Ambulatory Visit (INDEPENDENT_AMBULATORY_CARE_PROVIDER_SITE_OTHER): Payer: 59 | Admitting: Psychiatry

## 2019-06-09 DIAGNOSIS — F4310 Post-traumatic stress disorder, unspecified: Secondary | ICD-10-CM

## 2019-06-09 DIAGNOSIS — F3181 Bipolar II disorder: Secondary | ICD-10-CM | POA: Diagnosis not present

## 2019-06-09 NOTE — Progress Notes (Signed)
Virtual Visit via Video Note  I connected with [name] on 06/09/19 at  4:00 PM EDT by a video enabled telemedicine application and verified that I am speaking with the correct person using two identifiers.  Location: Patient: [name] Provider: Lise Auer, LCSW   I discussed the limitations of evaluation and management by telemedicine and the availability of in person appointments. The patient expressed understanding and agreed to proceed.  History of Present Illness: PTSD and GAD   Observations/Objective: Counselor met with [name] for individual therapy via Webex. Counselor assessed MH symptoms and progress on treatment plan goals. [name] denied suicidal ideation or self-harm behaviors. [name] shared that she was currently at work, so she was unable to meet for the planned duration of our meeting, however she was able to share some updates. [name] reports that her mental health and physical health symptoms continue to improve. She shared about current life stressors related to her daughters virtual learning and health related issues impacting her sister, who is currently hospitalized. [name] had exciting news to share, but was unable to do so in the setting of work, so she plans to Dance movement psychotherapist via e-mail to elaborate before the next session. Counselor assessed safety, medication management and overall wellness. [name] shared that she is doing really well this week and looks forward to our session next week. Counselor encouraged and praised her in her efforts towards recovery and maintenance of mental health.   Assessment and Plan: Counselor will continue to meet with patient to address treatment plan goals. Patient will continue to follow recommendations of providers and implement skills learned in session.  Follow Up Instructions: Counselor will send information for next session via Webex.     I discussed the assessment and treatment plan with the patient. The patient was  provided an opportunity to ask questions and all were answered. The patient agreed with the plan and demonstrated an understanding of the instructions.   The patient was advised to call back or seek an in-person evaluation if the symptoms worsen or if the condition fails to improve as anticipated.  I provided 15 minutes of non-face-to-face time during this encounter.   Lise Auer, LCSW

## 2019-06-16 ENCOUNTER — Ambulatory Visit (HOSPITAL_COMMUNITY): Payer: Medicaid Other | Admitting: Psychiatry

## 2019-06-16 ENCOUNTER — Other Ambulatory Visit: Payer: Self-pay

## 2019-06-21 ENCOUNTER — Ambulatory Visit (INDEPENDENT_AMBULATORY_CARE_PROVIDER_SITE_OTHER): Payer: 59 | Admitting: Psychiatry

## 2019-06-21 ENCOUNTER — Other Ambulatory Visit: Payer: Self-pay

## 2019-06-21 DIAGNOSIS — F4312 Post-traumatic stress disorder, chronic: Secondary | ICD-10-CM | POA: Diagnosis not present

## 2019-06-21 DIAGNOSIS — F4310 Post-traumatic stress disorder, unspecified: Secondary | ICD-10-CM

## 2019-06-21 DIAGNOSIS — F3181 Bipolar II disorder: Secondary | ICD-10-CM

## 2019-06-21 DIAGNOSIS — F410 Panic disorder [episodic paroxysmal anxiety] without agoraphobia: Secondary | ICD-10-CM

## 2019-06-21 MED ORDER — PRAZOSIN HCL 2 MG PO CAPS: 2.0000 mg | ORAL_CAPSULE | Freq: Every day | ORAL | 0 refills | Status: DC

## 2019-06-21 MED ORDER — ZOLPIDEM TARTRATE ER 12.5 MG PO TBCR: 12.5000 mg | EXTENDED_RELEASE_TABLET | Freq: Every evening | ORAL | 1 refills | Status: DC | PRN

## 2019-06-21 MED ORDER — CLONAZEPAM 0.5 MG PO TABS: 0.5000 mg | ORAL_TABLET | Freq: Two times a day (BID) | ORAL | 1 refills | Status: DC | PRN

## 2019-06-21 MED ORDER — LAMOTRIGINE 200 MG PO TABS: 200.0000 mg | ORAL_TABLET | Freq: Every day | ORAL | 2 refills | Status: DC

## 2019-06-21 MED ORDER — VORTIOXETINE HBR 10 MG PO TABS: 10.0000 mg | ORAL_TABLET | Freq: Every day | ORAL | 2 refills | Status: DC
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
## 2019-06-22 ENCOUNTER — Other Ambulatory Visit (HOSPITAL_COMMUNITY): Payer: Self-pay

## 2019-06-22 ENCOUNTER — Telehealth (HOSPITAL_COMMUNITY): Payer: Self-pay

## 2019-06-22 ENCOUNTER — Other Ambulatory Visit (HOSPITAL_COMMUNITY): Payer: Self-pay | Admitting: Psychiatry

## 2019-06-22 DIAGNOSIS — F4312 Post-traumatic stress disorder, chronic: Secondary | ICD-10-CM

## 2019-06-22 DIAGNOSIS — F410 Panic disorder [episodic paroxysmal anxiety] without agoraphobia: Secondary | ICD-10-CM

## 2019-06-22 DIAGNOSIS — F3181 Bipolar II disorder: Secondary | ICD-10-CM

## 2019-06-22 MED ORDER — CLONAZEPAM 0.5 MG PO TABS: 0.5000 mg | ORAL_TABLET | Freq: Two times a day (BID) | ORAL | 1 refills | Status: DC | PRN

## 2019-06-22 MED ORDER — ZOLPIDEM TARTRATE ER 12.5 MG PO TBCR: 12.5000 mg | EXTENDED_RELEASE_TABLET | Freq: Every evening | ORAL | 1 refills | Status: DC | PRN

## 2019-06-22 MED ORDER — LAMOTRIGINE 200 MG PO TABS: 200.0000 mg | ORAL_TABLET | Freq: Every day | ORAL | 2 refills | Status: DC

## 2019-06-22 MED ORDER — VORTIOXETINE HBR 10 MG PO TABS: 10.0000 mg | ORAL_TABLET | Freq: Every day | ORAL | 2 refills | Status: DC

## 2019-06-22 MED ORDER — PRAZOSIN HCL 2 MG PO CAPS: 2.0000 mg | ORAL_CAPSULE | Freq: Every day | ORAL | 0 refills | Status: DC

## 2019-06-22 NOTE — Telephone Encounter (Signed)
Shala from [facility]  called regarding patient's Clonazepam 0.5mg , Trintellix 10mg  , and her Zolpidem 12.5mg . They stated that they don't fill any of those medications. I then called the patient and she stated that she only uses [facility] on [address] in [place] now. I called them to transfer the prescriptions and they stated that they they can fill the Trintellix but it needs to be resent. They need new scripts for the Zolpidem and Clonazepam since they're controlled substances and can't transfer. Please review and advise. Thank you.

## 2019-06-22 NOTE — Telephone Encounter (Signed)
Done

## 2019-07-05 ENCOUNTER — Other Ambulatory Visit: Payer: Self-pay | Admitting: Family Medicine

## 2019-07-05 DIAGNOSIS — R519 Headache, unspecified: Secondary | ICD-10-CM

## 2019-07-05 DIAGNOSIS — R112 Nausea with vomiting, unspecified: Secondary | ICD-10-CM

## 2019-07-06 ENCOUNTER — Ambulatory Visit: Payer: Medicaid Other | Admitting: Family Medicine

## 2019-07-19 ENCOUNTER — Ambulatory Visit (INDEPENDENT_AMBULATORY_CARE_PROVIDER_SITE_OTHER): Payer: 59 | Admitting: Psychiatry

## 2019-07-19 ENCOUNTER — Other Ambulatory Visit: Payer: Self-pay

## 2019-07-19 DIAGNOSIS — F3181 Bipolar II disorder: Secondary | ICD-10-CM | POA: Diagnosis not present

## 2019-07-19 NOTE — Progress Notes (Signed)
Virtual Visit via Video Note  I connected with [name] on 07/19/19 at  3:00 PM EDT by a video enabled telemedicine application and verified that I am speaking with the correct person using two identifiers.  Location: Patient: [name] Provider: Lise Auer, LCSW   I discussed the limitations of evaluation and management by telemedicine and the availability of in person appointments. The patient expressed understanding and agreed to proceed.  History of Present Illness: Bipolar 2 DO   Observations/Objective: Counselor met with Dierdra for individual therapy via Webex. Counselor assessed MH symptoms and progress on treatment plan goals. Jasmyne presented with high anxiety and moderate depression. Isabeau denied suicidal ideation or self-harm behaviors. Kamarii shared that since our last session she has been experiencing challenges with her daughter's mental health. She stated that she is working with daughter's therapist, her mom, school staff and girl [employer] leaders to get her stable. Counselor processed how the stressor is impacting her own mental health and functioning. Counselor reviewed coping skills and strategies to utilize and implement as prevention and in the midst of increased symptoms. Counselor promoted self-care. Shevon expressed concern about an upcoming medical test that requires her to be off her medications. Counselor discussed following crisis plan and reaching out to supports to share concerns and symptoms arise.   Assessment and Plan: Counselor will continue to meet with patient to address treatment plan goals. Patient will continue to follow recommendations of providers and implement skills learned in session.  Follow Up Instructions: Counselor will send information for next session via Webex.     I discussed the assessment and treatment plan with the patient. The patient was provided an opportunity to ask questions and all were answered. The patient agreed with the  plan and demonstrated an understanding of the instructions.   The patient was advised to call back or seek an in-person evaluation if the symptoms worsen or if the condition fails to improve as anticipated.  I provided 35 minutes of non-face-to-face time during this encounter.   Lise Auer, LCSW

## 2019-07-20 ENCOUNTER — Encounter (HOSPITAL_COMMUNITY): Payer: Self-pay | Admitting: Psychiatry

## 2019-07-30 ENCOUNTER — Other Ambulatory Visit: Payer: Medicaid Other

## 2019-08-11 ENCOUNTER — Other Ambulatory Visit: Payer: Self-pay

## 2019-08-11 ENCOUNTER — Ambulatory Visit (HOSPITAL_COMMUNITY): Payer: 59 | Admitting: Psychiatry

## 2019-08-22 ENCOUNTER — Ambulatory Visit (INDEPENDENT_AMBULATORY_CARE_PROVIDER_SITE_OTHER): Payer: 59 | Admitting: Psychiatry

## 2019-08-22 ENCOUNTER — Other Ambulatory Visit: Payer: Self-pay

## 2019-08-22 DIAGNOSIS — F4312 Post-traumatic stress disorder, chronic: Secondary | ICD-10-CM | POA: Diagnosis not present

## 2019-08-22 DIAGNOSIS — F410 Panic disorder [episodic paroxysmal anxiety] without agoraphobia: Secondary | ICD-10-CM | POA: Diagnosis not present

## 2019-08-22 DIAGNOSIS — F3181 Bipolar II disorder: Secondary | ICD-10-CM | POA: Diagnosis not present

## 2019-08-22 MED ORDER — VORTIOXETINE HBR 5 MG PO TABS: 5.0000 mg | ORAL_TABLET | Freq: Every day | ORAL | 2 refills | Status: DC

## 2019-08-22 MED ORDER — LAMOTRIGINE 200 MG PO TABS: 200.0000 mg | ORAL_TABLET | Freq: Every day | ORAL | 2 refills | Status: DC

## 2019-08-22 MED ORDER — ZOLPIDEM TARTRATE ER 12.5 MG PO TBCR: 12.5000 mg | EXTENDED_RELEASE_TABLET | Freq: Every evening | ORAL | 1 refills | Status: DC | PRN

## 2019-08-22 MED ORDER — CLONAZEPAM 0.5 MG PO TABS: 0.5000 mg | ORAL_TABLET | Freq: Two times a day (BID) | ORAL | 2 refills | Status: DC | PRN

## 2019-08-22 MED ORDER — PRAZOSIN HCL 2 MG PO CAPS: 2.0000 mg | ORAL_CAPSULE | Freq: Every day | ORAL | 0 refills | Status: DC
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
## 2019-08-23 ENCOUNTER — Telehealth (HOSPITAL_COMMUNITY): Payer: Self-pay

## 2019-08-23 NOTE — Telephone Encounter (Signed)
Medication management - Prior authorization initiated on CoverMyMeds with [employer].  Decision pending.

## 2019-08-29 ENCOUNTER — Telehealth: Payer: Self-pay | Admitting: Internal Medicine

## 2019-08-29 MED ORDER — AZITHROMYCIN 250 MG PO TABS: ORAL_TABLET | ORAL | 0 refills | Status: DC

## 2019-08-29 NOTE — Telephone Encounter (Signed)
Acute deep cough / rhinitis/bronchitis syndrome x 2 days. Tested negative for flu and covid. Reviewed options. Plan- hydration, Zpak,

## 2019-09-01 ENCOUNTER — Telehealth: Payer: Self-pay | Admitting: Internal Medicine

## 2019-09-01 MED ORDER — ALBUTEROL SULFATE (2.5 MG/3ML) 0.083% IN NEBU: 2.5000 mg | INHALATION_SOLUTION | Freq: Four times a day (QID) | RESPIRATORY_TRACT | 12 refills | Status: DC | PRN

## 2019-09-01 NOTE — Telephone Encounter (Signed)
Dealing with acute bronchitis- Covid and Flu tested neg. Asks neb solution. Plan- script for albuterol neb solu e-sent to [facility]

## 2019-09-08 ENCOUNTER — Ambulatory Visit (INDEPENDENT_AMBULATORY_CARE_PROVIDER_SITE_OTHER): Payer: 59 | Admitting: Psychiatry

## 2019-09-08 ENCOUNTER — Other Ambulatory Visit: Payer: Self-pay

## 2019-09-08 ENCOUNTER — Encounter (HOSPITAL_COMMUNITY): Payer: Self-pay | Admitting: Psychiatry

## 2019-09-08 DIAGNOSIS — F3181 Bipolar II disorder: Secondary | ICD-10-CM | POA: Diagnosis not present

## 2019-09-08 NOTE — Progress Notes (Signed)
Virtual Visit via Video Note  I connected with [name] on 09/08/19 at  4:00 PM EST by a video enabled telemedicine application and verified that I am speaking with the correct person using two identifiers.  Location: Patient: [name] Provider: Lise Auer, LCSW   I discussed the limitations of evaluation and management by telemedicine and the availability of in person appointments. The patient expressed understanding and agreed to proceed.  History of Present Illness: Bipolar 2 DO   Observations/Objective: Counselor met with [name] for individual therapy via Webex. Counselor assessed MH symptoms and progress on treatment plan goals. [name] presents with mild depression and mild anxiety. [name] denied suicidal ideation or self-harm behaviors. Ed shared that she has been experiencing some physical health concerns that have been impacting her mental health and functioning. Counselor encouraged follow up with providers and adherence to recommendations. Counselor assessed application of coping skills, identifying strategies to implement more routinely. [name] noted progress in managing panic attack and communicating more assertively and effectively with family and co-workers. [name] reports positive reports on self-esteem, self-image due to progress on weight management goals. Counselor identified strengths and observations in progress of goals. Counselor and Bre discussed a transition plan in decreasing services and what she needs to maintain progress in management of mental health. We will continue to meet into Jan and will reassess at that time.   Assessment and Plan: Counselor will continue to meet with patient to address treatment plan goals. Patient will continue to follow recommendations of providers and implement skills learned in session.  Follow Up Instructions: Counselor will send information for next session via Webex.     I discussed the assessment and treatment plan  with the patient. The patient was provided an opportunity to ask questions and all were answered. The patient agreed with the plan and demonstrated an understanding of the instructions.   The patient was advised to call back or seek an in-person evaluation if the symptoms worsen or if the condition fails to improve as anticipated.  I provided 40 minutes of non-face-to-face time during this encounter.   Lise Auer, LCSW

## 2019-09-22 ENCOUNTER — Other Ambulatory Visit: Payer: Self-pay | Admitting: Physician Assistant

## 2019-09-22 DIAGNOSIS — N6310 Unspecified lump in the right breast, unspecified quadrant: Secondary | ICD-10-CM

## 2019-10-03 ENCOUNTER — Ambulatory Visit
Admission: RE | Admit: 2019-10-03 | Discharge: 2019-10-03 | Disposition: A | Discharge status: Home / Self Care | Payer: 59 | Source: Ambulatory Visit | Attending: Physician Assistant | Admitting: Physician Assistant

## 2019-10-03 ENCOUNTER — Ambulatory Visit
Admission: RE | Admit: 2019-10-03 | Discharge: 2019-10-03 | Disposition: A | Discharge status: Home / Self Care | Payer: Medicaid Other | Source: Ambulatory Visit | Attending: Physician Assistant | Admitting: Physician Assistant

## 2019-10-03 ENCOUNTER — Other Ambulatory Visit: Payer: Self-pay | Admitting: Physician Assistant

## 2019-10-03 ENCOUNTER — Other Ambulatory Visit: Payer: Self-pay

## 2019-10-03 DIAGNOSIS — N6310 Unspecified lump in the right breast, unspecified quadrant: Secondary | ICD-10-CM

## 2019-10-10 ENCOUNTER — Other Ambulatory Visit (HOSPITAL_COMMUNITY): Payer: Self-pay | Admitting: Psychiatry

## 2019-10-10 ENCOUNTER — Telehealth (HOSPITAL_COMMUNITY): Payer: Self-pay | Admitting: *Deleted

## 2019-10-10 MED ORDER — ESZOPICLONE 3 MG PO TABS: 3.0000 mg | ORAL_TABLET | Freq: Every day | ORAL | 0 refills | Status: DC

## 2019-10-10 NOTE — Telephone Encounter (Signed)
She has appointment on 10/25/19. I will order Lunesta instead of Ambien which she told me last time works very well? We can discuss other antidepressant options during her visit. I worry that Lunesta may require preauthorization as well.

## 2019-10-10 NOTE — Telephone Encounter (Signed)
Writer spoke with pt who states that "I need something" and that she is "not going to make it". Writer asked if pt had any s.i. and pt stated no. She c/o only sleeping approximately an hour a night explaining that she had a "really bad weekend". Pt says that she is still taking all of her medications as ordered with the exception of the Trintellix as she is waiting on a PA and she would rather just take something else. Pt c/o "barely being able to work". Pt was offered earlier appointment, samples, both of which pt declined. Please review and advise.

## 2019-10-13 ENCOUNTER — Ambulatory Visit
Admission: RE | Admit: 2019-10-13 | Discharge: 2019-10-13 | Disposition: A | Discharge status: Home / Self Care | Payer: 59 | Source: Ambulatory Visit | Attending: Physician Assistant | Admitting: Physician Assistant

## 2019-10-13 ENCOUNTER — Other Ambulatory Visit: Payer: Self-pay

## 2019-10-13 DIAGNOSIS — N6310 Unspecified lump in the right breast, unspecified quadrant: Secondary | ICD-10-CM

## 2019-10-19 ENCOUNTER — Ambulatory Visit (INDEPENDENT_AMBULATORY_CARE_PROVIDER_SITE_OTHER): Payer: 59 | Admitting: Psychiatry

## 2019-10-19 ENCOUNTER — Encounter (HOSPITAL_COMMUNITY): Payer: Self-pay | Admitting: Psychiatry

## 2019-10-19 ENCOUNTER — Other Ambulatory Visit: Payer: Self-pay

## 2019-10-19 DIAGNOSIS — F3181 Bipolar II disorder: Secondary | ICD-10-CM | POA: Diagnosis not present

## 2019-10-19 NOTE — Progress Notes (Signed)
Virtual Visit via Video Note  I connected with [name] on 10/19/19 at  1:00 PM EST by a video enabled telemedicine application and verified that I am speaking with the correct person using two identifiers.  Location: Patient: Patient Home Provider: Home Office   I discussed the limitations of evaluation and management by telemedicine and the availability of in person appointments. The patient expressed understanding and agreed to proceed.  History of Present Illness: Bipolar 2 DO   Treatment Plan Goals: [name] will process traumatic life events with therapist to regain control of her thoughts, feelings and actions to decrease depressive and anxiety symptoms.  Observations/Objective: Counselor met with [name] for individual therapy via Webex. Counselor assessed MH symptoms and progress on treatment plan goals, with patient reporting reduction in PTSD symptoms and intentional responses to others by pausing to gather thoughts and respond rationally vs emotionally. [name] presents with moderate depression and moderate anxiety. [name] denied suicidal ideation or self-harm behaviors.   [name] shared that she is continuing to adjust to working virtually from home and about advocating for better work life boundaries to her supervisor with a positive outcome. Counselor praised Scientist, research (physical sciences) for application of skills, self-advocacy and assertive communication. Counselor and [name] processed trauma impacts on her and her daughter's relationship, additional therapy resources for her daughter and strategies in reconnecting in a healthy way. Counselor provided psychoeducation on impacts of childhood traumas and helpful skills to implement with daughter. Counselor discussed impact of health conditions on her daily functioning and how it impacts mood/emotions. Counselor and [name] discussed discharge planning needs, identifying that she would like to be transferred to a new therapist while Counselor is on leave.  Counselor to send her options of providers.   Assessment and Plan: Counselor will continue to meet with patient to address treatment plan goals. Patient will continue to follow recommendations of providers and implement skills learned in session.  Follow Up Instructions: Counselor will send information for next session via Webex.    The patient was advised to call back or seek an in-person evaluation if the symptoms worsen or if the condition fails to improve as anticipated.  I provided 55 minutes of non-face-to-face time during this encounter.   Lise Auer, LCSW

## 2019-10-21 ENCOUNTER — Other Ambulatory Visit (HOSPITAL_COMMUNITY): Payer: Self-pay | Admitting: Psychiatry

## 2019-10-21 ENCOUNTER — Telehealth (HOSPITAL_COMMUNITY): Payer: Self-pay

## 2019-10-21 MED ORDER — DULOXETINE HCL 30 MG PO CPEP: ORAL_CAPSULE | ORAL | 0 refills | Status: DC

## 2019-10-21 NOTE — Telephone Encounter (Signed)
Let us try duloxetine 30 mg daily x 7 days then 60 mg daily. Rx send to pharmacy.

## 2019-10-21 NOTE — Telephone Encounter (Signed)
Received a fax from patient's pharmacy regarding her Trintellix 5mg . Checked on cover my meds and her Trintellix 5mg  has been DENIED by the insurance. The alternatives that were offered are: Citalopram Hydrobromide, Escitalopram Oxalate, Fluoxetine HCI, Paroxetine HCI, Sertraline HCI, Duloxetine HCI, and Venlafaxine HCI ER. Please review and advise. Thank you.

## 2019-10-25 ENCOUNTER — Other Ambulatory Visit: Payer: Self-pay

## 2019-10-25 ENCOUNTER — Ambulatory Visit (INDEPENDENT_AMBULATORY_CARE_PROVIDER_SITE_OTHER): Payer: 59 | Admitting: Psychiatry

## 2019-10-25 DIAGNOSIS — F3181 Bipolar II disorder: Secondary | ICD-10-CM | POA: Diagnosis not present

## 2019-10-25 DIAGNOSIS — F4312 Post-traumatic stress disorder, chronic: Secondary | ICD-10-CM | POA: Diagnosis not present

## 2019-10-25 DIAGNOSIS — F410 Panic disorder [episodic paroxysmal anxiety] without agoraphobia: Secondary | ICD-10-CM | POA: Diagnosis not present

## 2019-10-25 MED ORDER — PRAZOSIN HCL 2 MG PO CAPS: 2.0000 mg | ORAL_CAPSULE | Freq: Every day | ORAL | 0 refills | Status: DC

## 2019-10-25 MED ORDER — CLONAZEPAM 0.5 MG PO TABS: 0.5000 mg | ORAL_TABLET | Freq: Three times a day (TID) | ORAL | 2 refills | Status: DC | PRN

## 2019-10-25 MED ORDER — LAMOTRIGINE 200 MG PO TABS: 200.0000 mg | ORAL_TABLET | Freq: Every day | ORAL | 2 refills | Status: DC
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
## 2019-10-26 NOTE — Telephone Encounter (Signed)
Had notified patient 

## 2019-11-09 ENCOUNTER — Telehealth (HOSPITAL_COMMUNITY): Payer: Self-pay

## 2019-11-09 ENCOUNTER — Other Ambulatory Visit (HOSPITAL_COMMUNITY): Payer: Self-pay | Admitting: Psychiatry

## 2019-11-09 MED ORDER — DESVENLAFAXINE SUCCINATE ER 50 MG PO TB24: 50.0000 mg | ORAL_TABLET | Freq: Every day | ORAL | 1 refills | Status: DC

## 2019-11-09 NOTE — Telephone Encounter (Signed)
I changed it to Pristiq 50 mg - it may also cause nausea but possibly less likely.

## 2019-11-09 NOTE — Telephone Encounter (Signed)
Patient called and stated that she's not going to be able to take the Cymbalta 30mg . She stated that she keeps vomiting and experiences fatigue, headaches, heart palpitations, and dizziness. She also stated that she feels worse on it than before she started taking it. Is there an alternative for her to take? Please review and advise. Thank you.

## 2019-11-14 ENCOUNTER — Ambulatory Visit (INDEPENDENT_AMBULATORY_CARE_PROVIDER_SITE_OTHER): Payer: 59 | Admitting: Psychiatry

## 2019-11-14 ENCOUNTER — Other Ambulatory Visit: Payer: Self-pay

## 2019-11-14 DIAGNOSIS — F410 Panic disorder [episodic paroxysmal anxiety] without agoraphobia: Secondary | ICD-10-CM

## 2019-11-14 DIAGNOSIS — F3181 Bipolar II disorder: Secondary | ICD-10-CM

## 2019-11-14 NOTE — Progress Notes (Signed)
Virtual Visit via Video Note  I connected with [name] on 11/14/19 at  4:00 PM EST by a video enabled telemedicine application and verified that I am speaking with the correct person using two identifiers.  Location: Patient: Patient Home Provider: Home Office   I discussed the limitations of evaluation and management by telemedicine and the availability of in person appointments. The patient expressed understanding and agreed to proceed.  History of Present Illness: Bipolar 2 DO  Treatment Plan Goals: [name] will process traumatic life events with therapist to regain control of her thoughts, feelings and actions to decrease depressive and anxiety symptoms.  Observations/Objective: Counselor met with [name] for individual therapy via Webex. Counselor assessed MH symptoms and progress on treatment plan goals, with patient reporting increase in panic and anxiety symptoms due to issues related to her medications no longer being covered by her insurance. [name] presents with moderate depression and severe anxiety. [name] denied suicidal ideation or self-harm behaviors.   [name] shared that she has regressed significantly over the past month, due to issues related to access to medications. Counselor and [name] discussed how [name] and the Counselor could advocate for access to proper treatment at an affordable rate. [name] noted that she is frustrated, highly anxious, irritable, "spirialing out of control", "on edge" and gaining weight. Counselor assessed application of coping skills and daily functioning, with [name] noting that her panic attacks have returned on a daily to 6 times a week basis. She shared that she is attempting to distract self with work, projects around the house, positive self-talk, journaling, engaging rational brain vs emotional brain, and trying to focus on daughters needs in the evenings.   Counselor and [name] discussed ongoing need for therapy. Counselor is transferring  Bayla to [employer] Counselor to continue meeting and assessing needs through outpatient therapy. Counselor to connect [name] and [employer] via e-mail to arrange first session, as Counselor will be on Leave temporarily. [name] understood plan and will continue working with new therapist on current and reestablished goals based on new mental health indicators. .   Assessment and Plan: New Counselor will continue to meet with patient to address treatment plan goals. Patient will continue to follow recommendations of providers and implement skills learned in session.  Follow Up Instructions: New Counselor will send information for next session via Webex.    The patient was advised to call back or seek an in-person evaluation if the symptoms worsen or if the condition fails to improve as anticipated.  I provided 50 minutes of non-face-to-face time during this encounter.   Lise Auer, LCSW

## 2019-11-15 ENCOUNTER — Encounter (HOSPITAL_COMMUNITY): Payer: Self-pay | Admitting: Psychiatry

## 2019-11-15 NOTE — Telephone Encounter (Signed)
Notified patient.

## 2019-11-21 ENCOUNTER — Telehealth (HOSPITAL_COMMUNITY): Payer: Self-pay | Admitting: Licensed Clinical Social Worker

## 2019-11-21 NOTE — Telephone Encounter (Signed)
Contacted client to schedule therapy appt. Client verbalized difficulty and stress related to medication issues/needing prior authorization.Clinician transferred clt to nursing staff. Clt agreed to contact this office to schedule therapy appt.

## 2019-11-23 ENCOUNTER — Telehealth (HOSPITAL_COMMUNITY): Payer: Self-pay

## 2019-11-23 NOTE — Telephone Encounter (Signed)
Spoke with patient and she stated that she's not in good mental health. She stated that she spoke with her insurance company and they told her that Trintellix is the preferred formulary on her insurance plan. Spoke with the pharmacy and they're  faxing over a PA for the Trintellix. Right now it's at a little over $100 for cost but the prior authorization may change that. The cost for the Pristiq is $70. Will keep you informed. Thank you.

## 2019-11-23 NOTE — Telephone Encounter (Signed)
Thank you :)

## 2019-11-24 ENCOUNTER — Other Ambulatory Visit (HOSPITAL_COMMUNITY): Payer: Self-pay | Admitting: Psychiatry

## 2019-11-24 ENCOUNTER — Telehealth: Payer: Self-pay

## 2019-11-24 MED ORDER — VORTIOXETINE HBR 10 MG PO TABS: ORAL_TABLET | ORAL | 0 refills | Status: DC

## 2019-11-24 NOTE — Telephone Encounter (Signed)
[place] 10MG  TABLET PA# [id number] EFFECTIVE 11/24/2019 TO 11/23/2020

## 2019-12-02 ENCOUNTER — Ambulatory Visit (INDEPENDENT_AMBULATORY_CARE_PROVIDER_SITE_OTHER): Payer: 59 | Admitting: Psychiatry

## 2019-12-02 ENCOUNTER — Other Ambulatory Visit: Payer: Self-pay

## 2019-12-02 DIAGNOSIS — F3181 Bipolar II disorder: Secondary | ICD-10-CM

## 2019-12-02 DIAGNOSIS — F4312 Post-traumatic stress disorder, chronic: Secondary | ICD-10-CM | POA: Diagnosis not present

## 2019-12-02 DIAGNOSIS — F410 Panic disorder [episodic paroxysmal anxiety] without agoraphobia: Secondary | ICD-10-CM | POA: Diagnosis not present

## 2019-12-02 MED ORDER — ESZOPICLONE 3 MG PO TABS: 3.0000 mg | ORAL_TABLET | Freq: Every evening | ORAL | 2 refills | Status: DC | PRN
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
## 2019-12-25 ENCOUNTER — Other Ambulatory Visit (HOSPITAL_COMMUNITY): Payer: Self-pay | Admitting: Psychiatry

## 2020-01-03 ENCOUNTER — Ambulatory Visit (INDEPENDENT_AMBULATORY_CARE_PROVIDER_SITE_OTHER): Payer: 59 | Admitting: Psychiatry

## 2020-01-03 ENCOUNTER — Other Ambulatory Visit: Payer: Self-pay

## 2020-01-03 DIAGNOSIS — F410 Panic disorder [episodic paroxysmal anxiety] without agoraphobia: Secondary | ICD-10-CM | POA: Diagnosis not present

## 2020-01-03 DIAGNOSIS — F4312 Post-traumatic stress disorder, chronic: Secondary | ICD-10-CM | POA: Diagnosis not present

## 2020-01-03 DIAGNOSIS — F3181 Bipolar II disorder: Secondary | ICD-10-CM | POA: Diagnosis not present

## 2020-01-03 MED ORDER — PRAZOSIN HCL 2 MG PO CAPS: 2.0000 mg | ORAL_CAPSULE | Freq: Every day | ORAL | 0 refills | Status: DC

## 2020-01-03 MED ORDER — VORTIOXETINE HBR 10 MG PO TABS: 10.0000 mg | ORAL_TABLET | Freq: Every day | ORAL | 2 refills | Status: DC

## 2020-01-03 MED ORDER — CLONAZEPAM 0.5 MG PO TABS: 0.5000 mg | ORAL_TABLET | Freq: Three times a day (TID) | ORAL | 2 refills | Status: DC | PRN

## 2020-01-03 MED ORDER — LAMOTRIGINE 200 MG PO TABS: 200.0000 mg | ORAL_TABLET | Freq: Every day | ORAL | 2 refills | Status: DC
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
## 2020-01-06 ENCOUNTER — Other Ambulatory Visit: Payer: Self-pay

## 2020-01-06 ENCOUNTER — Ambulatory Visit (INDEPENDENT_AMBULATORY_CARE_PROVIDER_SITE_OTHER): Payer: 59 | Admitting: Licensed Clinical Social Worker

## 2020-01-06 DIAGNOSIS — F410 Panic disorder [episodic paroxysmal anxiety] without agoraphobia: Secondary | ICD-10-CM

## 2020-01-06 DIAGNOSIS — F411 Generalized anxiety disorder: Secondary | ICD-10-CM

## 2020-01-06 DIAGNOSIS — R4589 Other symptoms and signs involving emotional state: Secondary | ICD-10-CM

## 2020-01-06 DIAGNOSIS — F3181 Bipolar II disorder: Secondary | ICD-10-CM | POA: Diagnosis not present

## 2020-01-06 DIAGNOSIS — F4312 Post-traumatic stress disorder, chronic: Secondary | ICD-10-CM | POA: Diagnosis not present

## 2020-01-06 NOTE — Progress Notes (Signed)
Virtual Visit via Telephone Note  I connected with [name] on 01/06/20 at  8:00 AM EDT by telephone and verified that I am speaking with the correct person using two identifiers.   The patient was advised to call back or seek an in-person evaluation if the symptoms worsen or if the condition fails to improve as anticipated.  I provided 60 minutes of non-face-to-face time during this encounter.   [name], LCSW    THERAPIST PROGRESS NOTE  Session Time: 9:05am-10:05am  Participation Level: Active  Behavioral Response: NAAlertAnxious  Type of Therapy: Individual Therapy  Treatment Goals addressed: Coping  Interventions: Motivational Interviewing, Strength-based and Supportive  Summary: Client presented alert and oriented x5 for scheduled session via telephone. Client provided an update reporting that she is having a rough morning in feeling sick and running late for work. Client discussed intentions of managing physical symptoms by picking up medicine from the pharmacy however discussed recent stressors with an ex-partner contacting her. Client processed feelings and boundaries that she has worked to maintain within the strained relationship. Client processed the progress she has made in the last year after she identified generational patterns of trauma and mental health symptoms. Client was tearful in discussing this progress and the pride she has in herself. Client reported she has engaged in parenting classes to improve her relationship with her daughter and is considering potentially fostering children at some point in the future. Client identified several activities she plans to engage in this weekend for self-care. Client denied SI/HI/psychosis.  Suicidal/Homicidal: Nowithout intent/plan  Therapist Response: Clinician joined client for session and facilitated check in assessing for recent stressors, changes in mood, and SI/HI/psychosis. Clinician engaged in discussion  on client's medical concerns and encouraged her to follow up with her PCP or hospital if needed. Clinician assessed client's recent self-care in taking care of her self due to reported stressors. Clinician validated client's feelings and praised her work towards implementing and maintaining boundaries in multiple areas of her life such as socially, employment, and with family members. Clinician described client as resilient and engaged in processing on past experiences that have increased her resiliency and her work towards using these experiences as an opportunity for growth and building connection with her daughter.  Plan: Return again in 6 weeks or first available appointment.  Diagnosis: Axis I: Bipolar II, PTSD, GAD, Panic d/o       [name], LCSW 01/06/2020

## 2020-02-17 ENCOUNTER — Other Ambulatory Visit: Payer: Self-pay

## 2020-02-17 ENCOUNTER — Ambulatory Visit (INDEPENDENT_AMBULATORY_CARE_PROVIDER_SITE_OTHER): Payer: 59 | Admitting: Licensed Clinical Social Worker

## 2020-02-17 DIAGNOSIS — F411 Generalized anxiety disorder: Secondary | ICD-10-CM

## 2020-02-17 DIAGNOSIS — F3181 Bipolar II disorder: Secondary | ICD-10-CM

## 2020-02-17 NOTE — Progress Notes (Signed)
  Virtual Visit via Telephone Note  I connected with [name] on 02/17/20 at  9:00 AM EDT by telephone and verified that I am speaking with the correct person using two identifiers.   The patient was advised to call back or seek an in-person evaluation if the symptoms worsen or if the condition fails to improve as anticipated.  Location: Pt: Home Clinician: [place]  I provided 30 minutes of non-face-to-face time during this encounter.   [name], LCSW    THERAPIST PROGRESS NOTE  Session Time: 9am-9:30am  Participation Level: Active  Behavioral Response: NeatAlertEuthymic  Type of Therapy: Individual Therapy  Treatment Goals addressed: Coping  Interventions: Motivational Interviewing, Strength-based and Supportive  Summary:  Client presented alert and oriented for therapy session via telephone. Client engaged in check in reporting things have been "stable and going well". Client discussed self-care she has engaged in as well as as recent promotion received at work. Client processed feelings of pride related to this as well as pride in the fact that she has worked hard to make this progress. Client relayed "It feels eery for it to be this calm". Client engaged in discussion on her intention to move forward in enjoyment of this progress and stability rather than focusing on potential negative or decline. Client agreed to contact this clinic to schedule appt with clinician B. Morris to continue services with her primary therapist. Client denied SI/HI/psychosis during this engagement.  Suicidal/Homicidal: Nowithout intent/plan  Therapist Response: Clinician met with client and facilitated check in assessing recent stressors, changes in mood, and SI/HI/psychosis. Clinician and clt engaged in discussion on reported progress and stability and clinician highlighted bx and personal strengths that have led to this progress. Clinician praised client's limit setting with  others and intentionality in self-care as well as spending time with her daughter since last session. Clinician utilized open ended questions and summarizations throughout session while offering support and validation. Clinician relayed that clt's primary therapist is returning to the office and will be available to resume services moving forward.  Plan: Pt agreed to contact this clinic to schedule appt w/ her primary therapist B. Morris to resume services.  Diagnosis: Axis I: Bipolar, GAD      [name], LCSW 02/17/2020

## 2020-03-13 ENCOUNTER — Other Ambulatory Visit: Payer: Self-pay

## 2020-03-13 ENCOUNTER — Telehealth (INDEPENDENT_AMBULATORY_CARE_PROVIDER_SITE_OTHER): Payer: 59 | Admitting: Psychiatry

## 2020-03-13 DIAGNOSIS — F410 Panic disorder [episodic paroxysmal anxiety] without agoraphobia: Secondary | ICD-10-CM | POA: Diagnosis not present

## 2020-03-13 DIAGNOSIS — F411 Generalized anxiety disorder: Secondary | ICD-10-CM

## 2020-03-13 DIAGNOSIS — F3181 Bipolar II disorder: Secondary | ICD-10-CM

## 2020-03-13 MED ORDER — ESZOPICLONE 3 MG PO TABS: 3.0000 mg | ORAL_TABLET | Freq: Every evening | ORAL | 2 refills | Status: DC | PRN
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
## 2020-03-19 ENCOUNTER — Ambulatory Visit (INDEPENDENT_AMBULATORY_CARE_PROVIDER_SITE_OTHER): Payer: 59 | Admitting: Psychiatry

## 2020-03-19 ENCOUNTER — Other Ambulatory Visit: Payer: Self-pay

## 2020-03-19 DIAGNOSIS — F3181 Bipolar II disorder: Secondary | ICD-10-CM

## 2020-03-19 NOTE — Progress Notes (Signed)
Virtual Visit via Video Note  I connected with [name] on 03/19/20 at  2:30 PM EDT by a video enabled telemedicine application and verified that I am speaking with the correct person using two identifiers.  Location: Patient: Patient Home Provider: Home Office   I discussed the limitations of evaluation and management by telemedicine and the availability of in person appointments. The patient expressed understanding and agreed to proceed.  History of Present Illness: Bipolar 2 DO, Major Depressive type, PTSD   Treatment Plan Goals: 1) [name] would like to implement more self-care practices into life to reduce stress and anxiety.  2) [name] would like to process and applying grounding and mindfulness practices when experiencing trauma triggers and reminders.    Observations/Objective: Counselor met with Client for individual therapy via Webex. Counselor assessed MH symptoms and progress on treatment plan goals, with patient reporting that overall she is doing great and is stabilizing symptoms on an ongoing basis. Client presents with mild depression and mild anxiety. Client denied suicidal ideation or self-harm behaviors.   Client shared that treatment with provider covering for Counselor while on maternity leave went well, learned new strategies and received a promotion in the last few months. Counselor assessed daily functioning, with Client reporting no panic attacks over the past month, taking care of hygiene, home and parenting responsibilities. Client noted that she is in a new relationship, proudly sharing that she has established appropriate boundaries and expressed those to him, and that he is adhering to her boundaries thus far. Counselor praised client for success in all areas of her life.   Counselor and Client reviewed and updated CCA, as well as worked to establish new treatment plan goals (see above). Client would like to meet every 3 weeks to once a month to address  current goals. Client's technology dropped off before session properly ended, with Counselor calling back and sending an e-mail for follow up considerations. Client to call to set up next appointment.   Assessment and Plan: Counselor will continue to meet with patient to address treatment plan goals. Patient will continue to follow recommendations of providers and implement skills learned in session.  Follow Up Instructions: Counselor will send information for next session via Webex.    The patient was advised to call back or seek an in-person evaluation if the symptoms worsen or if the condition fails to improve as anticipated.  I provided 45 minutes of non-face-to-face time during this encounter.   Lise Auer, LCSW

## 2020-03-28 ENCOUNTER — Encounter (HOSPITAL_COMMUNITY): Payer: Self-pay | Admitting: Psychiatry

## 2020-03-28 ENCOUNTER — Other Ambulatory Visit: Payer: Self-pay | Admitting: Gastroenterology

## 2020-03-28 DIAGNOSIS — R6881 Early satiety: Secondary | ICD-10-CM

## 2020-04-10 ENCOUNTER — Other Ambulatory Visit: Payer: Self-pay

## 2020-04-10 ENCOUNTER — Encounter (HOSPITAL_COMMUNITY)
Admission: RE | Admit: 2020-04-10 | Discharge: 2020-04-10 | Disposition: A | Discharge status: Home / Self Care | Payer: 59 | Source: Ambulatory Visit | Attending: Gastroenterology | Admitting: Gastroenterology

## 2020-04-10 DIAGNOSIS — R6881 Early satiety: Secondary | ICD-10-CM | POA: Diagnosis not present

## 2020-04-10 MED ORDER — TECHNETIUM TC 99M SULFUR COLLOID
2.0500 | Freq: Once | INTRAVENOUS | Status: AC | PRN
  Administered 2020-04-10: 2.05 via ORAL

## 2020-04-18 ENCOUNTER — Other Ambulatory Visit: Payer: Self-pay

## 2020-04-18 ENCOUNTER — Telehealth (INDEPENDENT_AMBULATORY_CARE_PROVIDER_SITE_OTHER): Payer: 59 | Admitting: Psychiatry

## 2020-04-18 DIAGNOSIS — F410 Panic disorder [episodic paroxysmal anxiety] without agoraphobia: Secondary | ICD-10-CM | POA: Diagnosis not present

## 2020-04-18 DIAGNOSIS — F4312 Post-traumatic stress disorder, chronic: Secondary | ICD-10-CM | POA: Diagnosis not present

## 2020-04-18 DIAGNOSIS — F3181 Bipolar II disorder: Secondary | ICD-10-CM | POA: Diagnosis not present

## 2020-04-18 MED ORDER — LAMOTRIGINE 200 MG PO TABS: 200.0000 mg | ORAL_TABLET | Freq: Every day | ORAL | 2 refills | Status: DC

## 2020-04-18 MED ORDER — VORTIOXETINE HBR 10 MG PO TABS: 10.0000 mg | ORAL_TABLET | Freq: Every day | ORAL | 2 refills | Status: AC

## 2020-04-18 MED ORDER — PRAZOSIN HCL 2 MG PO CAPS: 2.0000 mg | ORAL_CAPSULE | Freq: Every day | ORAL | 0 refills | Status: DC
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
## 2020-04-24 ENCOUNTER — Telehealth (HOSPITAL_COMMUNITY): Payer: Self-pay

## 2020-04-25 NOTE — Telephone Encounter (Signed)
It is quite confusing: her insurance approves then rejects then approves Trintellix again? Now it rejects it once more. Is she changing insurance that is why? Since she had a good response to a low dose of Trintellix we should try to get it authorized. I only remember her trying Viibryd and Cymbalta earlier (could not tolerate the latter).

## 2020-05-07 ENCOUNTER — Telehealth (HOSPITAL_COMMUNITY): Payer: Self-pay

## 2020-05-07 NOTE — Telephone Encounter (Signed)
Thank you very much. We all would have been so much mentally better if not for health insurance (lol).

## 2020-05-23 ENCOUNTER — Other Ambulatory Visit: Payer: Self-pay

## 2020-05-23 ENCOUNTER — Telehealth (INDEPENDENT_AMBULATORY_CARE_PROVIDER_SITE_OTHER): Payer: 59 | Admitting: Psychiatry

## 2020-05-23 DIAGNOSIS — F411 Generalized anxiety disorder: Secondary | ICD-10-CM

## 2020-05-23 DIAGNOSIS — F3181 Bipolar II disorder: Secondary | ICD-10-CM | POA: Diagnosis not present

## 2020-05-23 DIAGNOSIS — F410 Panic disorder [episodic paroxysmal anxiety] without agoraphobia: Secondary | ICD-10-CM

## 2020-05-23 MED ORDER — CLONAZEPAM 0.5 MG PO TABS: 0.5000 mg | ORAL_TABLET | Freq: Three times a day (TID) | ORAL | 2 refills | Status: DC | PRN

## 2020-05-23 MED ORDER — ESZOPICLONE 3 MG PO TABS: 3.0000 mg | ORAL_TABLET | Freq: Every evening | ORAL | 2 refills | Status: DC | PRN
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
## 2020-06-14 ENCOUNTER — Telehealth (HOSPITAL_COMMUNITY): Payer: Self-pay | Admitting: Psychiatry

## 2020-06-14 ENCOUNTER — Other Ambulatory Visit: Payer: Self-pay

## 2020-06-14 ENCOUNTER — Encounter (HOSPITAL_COMMUNITY): Payer: Self-pay | Admitting: Psychiatry

## 2020-06-14 ENCOUNTER — Ambulatory Visit (INDEPENDENT_AMBULATORY_CARE_PROVIDER_SITE_OTHER): Payer: 59 | Admitting: Psychiatry

## 2020-06-14 DIAGNOSIS — F3181 Bipolar II disorder: Secondary | ICD-10-CM

## 2020-06-14 DIAGNOSIS — F410 Panic disorder [episodic paroxysmal anxiety] without agoraphobia: Secondary | ICD-10-CM

## 2020-06-14 NOTE — Telephone Encounter (Signed)
D:  Lise Auer, LCSW referred pt to MH-IOP.  A:  Contacted pt to re-orient and provide her with a start date.  Pt will start on 06-18-20.  Inform [place].  R:  Pt receptive.

## 2020-06-14 NOTE — Progress Notes (Signed)
Virtual Visit via Video Note  [name] connected with [name] on 06/14/20 at  1:30 PM EDT by a video enabled telemedicine application and verified that [name] am speaking with the correct person using two identifiers.  Location: Patient: Patient Home Provider: Home Office   [name] discussed the limitations of evaluation and management by telemedicine and the availability of in person appointments. The patient expressed understanding and agreed to proceed.  History of Present Illness: Bipolar 2 DO, Major Depressive type, PTSD  Treatment Plan Goals: 1) [name] would like to implement more self-care practices into life to reduce stress and anxiety.  2) [name] would like to process and applying grounding and mindfulness practices when experiencing trauma triggers and reminders.   Observations/Objective: Counselor met with [name] for individual therapy via Webex. Counselor assessed MH symptoms and progress on treatment plan goals, with patient reporting that she is at the lowest point she has been in years, "at the point of giving up". [name] presents with severe depression and severe anxiety. [name] denied suicidal ideation or self-harm behaviors, however, we discussed and updated Barbara safety plan to include beginning IOP treatment for daily treatment and monitoring. Counselor reviewed knowledge of safety protocols to reduce risk of self-harm. [name] reports being fixated on ex and the lady he cheated on Barbara with. Counselor discussed safety measures to prevent interactions or altercations, due to [name]'s heightened emotional state and potential for irrational/impulsive decision making. [name] agreed to follow through with recommendations and safety plan.    Counselor explored root causes and triggered for mental health decline. [name] shared that she is currently recovering from [place], causing Barbara to be more vulnerable to stressors. [name] named a variety of stressors, within intimate relationship, familial,  work related, financially and emotionally. Counselor used MI interventions to determine motivation to engage in treatment and change unhealthy patterns in behaviors. Counselor explored self-care practices [name] could implement between now and IOP program, focusing on things she can control vs. Things out of Barbara control. [name] able to identify focusing on Barbara and [name] basic needs and activities that relax, not stimulate. Counselor to make referral for [name] in IOP program and [name] to follow through with recommendations for care/safety.   Assessment and Plan: Counselor will continue to meet with patient to address treatment plan goals. Patient will continue to follow recommendations of providers and implement skills learned in session.  Follow Up Instructions: Counselor will send information for next session via Webex.    The patient was advised to call back or seek an in-person evaluation if the symptoms worsen or if the condition fails to improve as anticipated.  [name] provided 55 minutes of non-face-to-face time during this encounter.   Lise Auer, LCSW

## 2020-06-15 NOTE — Telephone Encounter (Signed)
I had tried to reach patient several times to follow up with her on the insurance/medication situation and to let her know we have samples of Trintellix here for her. I was not able to reach the patient after leaving several VM and she has not returned any of my calls

## 2020-06-18 ENCOUNTER — Other Ambulatory Visit: Payer: Self-pay

## 2020-06-18 ENCOUNTER — Encounter (HOSPITAL_COMMUNITY): Payer: Self-pay | Admitting: Psychiatry

## 2020-06-18 ENCOUNTER — Other Ambulatory Visit (HOSPITAL_COMMUNITY): Payer: 59 | Attending: Psychiatry | Admitting: Psychiatry

## 2020-06-18 DIAGNOSIS — R45851 Suicidal ideations: Secondary | ICD-10-CM | POA: Insufficient documentation

## 2020-06-18 DIAGNOSIS — Z881 Allergy status to other antibiotic agents status: Secondary | ICD-10-CM | POA: Diagnosis not present

## 2020-06-18 DIAGNOSIS — Z885 Allergy status to narcotic agent status: Secondary | ICD-10-CM | POA: Insufficient documentation

## 2020-06-18 DIAGNOSIS — Z888 Allergy status to other drugs, medicaments and biological substances status: Secondary | ICD-10-CM | POA: Diagnosis not present

## 2020-06-18 DIAGNOSIS — Z79899 Other long term (current) drug therapy: Secondary | ICD-10-CM | POA: Insufficient documentation

## 2020-06-18 DIAGNOSIS — Z882 Allergy status to sulfonamides status: Secondary | ICD-10-CM | POA: Diagnosis not present

## 2020-06-18 DIAGNOSIS — F410 Panic disorder [episodic paroxysmal anxiety] without agoraphobia: Secondary | ICD-10-CM

## 2020-06-18 DIAGNOSIS — E119 Type 2 diabetes mellitus without complications: Secondary | ICD-10-CM | POA: Diagnosis not present

## 2020-06-18 DIAGNOSIS — F4321 Adjustment disorder with depressed mood: Secondary | ICD-10-CM | POA: Insufficient documentation

## 2020-06-18 DIAGNOSIS — Z794 Long term (current) use of insulin: Secondary | ICD-10-CM | POA: Insufficient documentation

## 2020-06-18 DIAGNOSIS — F3181 Bipolar II disorder: Secondary | ICD-10-CM | POA: Insufficient documentation

## 2020-06-18 DIAGNOSIS — Z793 Long term (current) use of hormonal contraceptives: Secondary | ICD-10-CM | POA: Diagnosis not present

## 2020-06-18 DIAGNOSIS — F17210 Nicotine dependence, cigarettes, uncomplicated: Secondary | ICD-10-CM | POA: Insufficient documentation

## 2020-06-18 DIAGNOSIS — F4310 Post-traumatic stress disorder, unspecified: Secondary | ICD-10-CM | POA: Diagnosis not present

## 2020-06-18 NOTE — Progress Notes (Addendum)
Virtual Visit via Video Note  I connected with [name] on 06/18/20 at  9:00 AM EDT by a video enabled telemedicine application and verified that I am speaking with the correct person using two identifiers.  At orientation to the IOP program, Case Manager discussed the limitations of evaluation and management by telemedicine and the availability of in person appointments. The patient expressed understanding and agreed to proceed with virtual visits throughout the duration of the program.   Location:  Patient: Patient Home Provider: Home Office   History of Present Illness: Panic Disorder and Bipolar 2 DO  Observations/Objective: Check In: Case Manager checked in with all participants to review discharge dates, insurance authorizations, work-related documents and needs of the treatment team, including medication review and assessment. Case Manager introduced 2 new group members, with the group welcoming and introducing selves.    Initial Therapeutic Activity: Counselor facilitated therapeutic processing with group members to assess mood and current functioning, prompting group members to share about application of skills, progress and challenges in treatment/personal lives. Today is the Client's first group treatment session, with Client sharing about need for treatment, current life stressors, expectations of treatment and who is in her support system. Client experiencing a variety of losses, traumas and transitions, feeling "completely overwhelmed". Client would like to stabilize to regain daily functioning and heal from losses. Client presents with severe depression and severe anxiety. Client denied any current SI/HI/psychosis, however noted that her daughter and role as mom is what gives her motivation to live and seek treatment.   Second Therapeutic Activity: Counselor prompted group members to share one area in their lives that they would consider a "bad habit" or problem area. Client  mentioned issues surrounding substance use, including alcohol, tobacco and other. Counselor proceeded with sharing psychoeducation on the "[place]". Counselor prompted clients to look through 2 exhaustive lists of coping skills to identify ones they can use to prevent mental health episodes and to utilize during a mental health episode/crisis. Client communicated with Counselor to get permission to dismiss self from group because she was feeling exhausted and overwhelmed, needing to close eyes to rest for the remainder of group. Counselor granted permission, with Client endorsing that she is not a threat to herself or others. Counselor had Case Manager to check in and follow up, while completing group.    Assessment and Plan: Clinician recommends that Client remain in IOP treatment to better manage mental health symptoms, stabilization and to address treatment plan goals. Clinician recommends adherence to crisis/safety plan, taking medications as prescribed, and following up with medical professionals if any issues arise.   Follow Up Instructions: Clinician will send Webex link for next session. The Client was advised to call back or seek an in-person evaluation if the symptoms worsen or if the condition fails to improve as anticipated.     I provided 125 minutes of non-face-to-face time during this encounter.     Lise Auer, LCSW
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
## 2020-06-19 ENCOUNTER — Other Ambulatory Visit: Payer: Self-pay

## 2020-06-19 ENCOUNTER — Encounter (HOSPITAL_COMMUNITY): Payer: Self-pay | Admitting: Psychiatry

## 2020-06-19 ENCOUNTER — Other Ambulatory Visit (HOSPITAL_COMMUNITY): Payer: 59 | Admitting: Family

## 2020-06-19 DIAGNOSIS — F410 Panic disorder [episodic paroxysmal anxiety] without agoraphobia: Secondary | ICD-10-CM | POA: Diagnosis not present

## 2020-06-19 DIAGNOSIS — F3181 Bipolar II disorder: Secondary | ICD-10-CM

## 2020-06-19 NOTE — Progress Notes (Addendum)
Patient ID: [name], female   DOB: 1980-11-22, 39 y.o.   MRN: [id number] Virtual Visit via Video Note  I connected with [name] on @TODAY @ at  9:00 AM EDT by a video enabled telemedicine application and verified that I am speaking with the correct person using two identifiers.   I discussed the limitations of evaluation and management by telemedicine and the availability of in person appointments. The patient expressed understanding and agreed to proceed.  I discussed the assessment and treatment plan with the patient. The patient was provided an opportunity to ask questions and all were answered. The patient agreed with the plan and demonstrated an understanding of the instructions.   The patient was advised to call back or seek an in-person evaluation if the symptoms worsen or if the condition fails to improve as anticipated.  I provided 20 minutes of non-face-to-face time during this encounter.  Pt was referred per therapist Barbara Starcher Morris, LCSW) d/t worsening depressive and anxiety (multiple daily panic attacks) symptoms for ~ 5-6 months.  According to therapist, pt wasn't doing well on 06-14-20 when she was seen urgently.  Stressors:  1) boyfriend's infidelity and him moving out  2) Mom and sister threatening to have pt's child removed from her care  3) Recovering from COVID-19  4) Demoted at her job  5) Financial:  Neglected to pay bills for three months.  Pt's most recent MH-IOP was in April 2020 cc: previous notes for more hx.  Upon checking in on pt; she states that today wasn't no better than yesterday.  "It's actually ten times worse."  Pt became very tearful and is requesting to be transitioned to PHP.  A:  Discussed with Barbara Nutting, NP.  Pt will start in [facility] tomorrow.  Contacted pt's insurance company re: transfer to [facility].  [name] authorized 8 PHP days with a concurrent review with [name]) on 06-29-20.  Inform Barbara Glass, LCSW, [name],   and Dr. Montel Culver.  R:  Pt receptive.  Dellia Nims, M.Ed,CNA

## 2020-06-19 NOTE — Progress Notes (Signed)
d 
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
## 2020-06-19 NOTE — Progress Notes (Signed)
Virtual Visit via Video Note  I connected with [name] on 06/19/20 at  9:00 AM EDT by a video enabled telemedicine application and verified that I am speaking with the correct person using two identifiers.  At orientation to the IOP program, Case Manager discussed the limitations of evaluation and management by telemedicine and the availability of in person appointments. The patient expressed understanding and agreed to proceed with virtual visits throughout the duration of the program.   Location:  Patient: Patient Home Provider: Home Office   History of Present Illness: Panic DO and Bipolar 2 DO  Observations/Objective: Check In: Case Manager checked in with all participants to review discharge dates, insurance authorizations, work-related documents and needs of the treatment team, including medication review and assessment. Counselor facilitated therapeutic processing with group members to assess mood and current functioning, prompting group members to share about application of skills, progress and challenges in treatment/personal lives. Client reports that her condition has worsened since yesterday. Counselor discussed transitioning client to [facility] program, discussing further with case manager and NP. Client reported on increased panic attacks. Client reported meeting with x to exchange things, including the assertive communications and boundaries she put in place to avoid altercation or increase issues. Client identified that she needs more time to process grief and loss issues. Client presents with severe depression and severe anxiety. Client denied any current SI/HI/psychosis.   Initial Therapeutic Activity: Counselor allowed time for group to reflect in a journal entry with the prompt of: What has been your hardest moment so far in life? What has been you proudest moment? Counselor prompted group members to share their reflections aloud with the group. Client shared that their hardest  moment was her grandmother passing and her first divorce and their proudest moment was marring her first husband.    Second Therapeutic Activity: Counselor prompted group to reflect on their "Self-Esteem Journey" and then administrated a "Self-Esteem Assessment" with the Client scoring a 7 out of 30, with anything less 15 considered low self-esteem. Counselor provided psychoeducation on self-esteem and its effects on mental health. Due to healthy participation and discussion by group members, we will continue topic to tomorrow's session to learn about healthy coping skills/activities to improve self-esteem.  Check Out: Counselor invited group members to share one self-care practice or important call they need to make today to reduce anxiety. Client plans to rest and stay home for self-care. Client indicated that they are not currently a risk to self of others before signing off.  Assessment and Plan: Clinician recommends that Client remain in IOP treatment to better manage mental health symptoms, stabilization and to address treatment plan goals. Clinician recommends adherence to crisis/safety plan, taking medications as prescribed, and following up with medical professionals if any issues arise.   Follow Up Instructions: Clinician will send Webex link for next session. The Client was advised to call back or seek an in-person evaluation if the symptoms worsen or if the condition fails to improve as anticipated.     I provided 180 minutes of non-face-to-face time during this encounter.     Lise Auer, LCSW

## 2020-06-20 ENCOUNTER — Ambulatory Visit (HOSPITAL_COMMUNITY): Payer: 59

## 2020-06-20 ENCOUNTER — Other Ambulatory Visit (HOSPITAL_COMMUNITY): Payer: 59

## 2020-06-20 ENCOUNTER — Other Ambulatory Visit: Payer: Self-pay

## 2020-06-20 ENCOUNTER — Other Ambulatory Visit (HOSPITAL_COMMUNITY): Payer: 59 | Admitting: Licensed Clinical Social Worker

## 2020-06-20 DIAGNOSIS — F332 Major depressive disorder, recurrent severe without psychotic features: Secondary | ICD-10-CM

## 2020-06-20 DIAGNOSIS — R4589 Other symptoms and signs involving emotional state: Secondary | ICD-10-CM

## 2020-06-20 DIAGNOSIS — F3181 Bipolar II disorder: Secondary | ICD-10-CM
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
## 2020-06-21 ENCOUNTER — Ambulatory Visit (HOSPITAL_COMMUNITY): Payer: 59

## 2020-06-21 ENCOUNTER — Encounter (HOSPITAL_COMMUNITY): Payer: Self-pay

## 2020-06-21 ENCOUNTER — Other Ambulatory Visit: Payer: Self-pay

## 2020-06-21 ENCOUNTER — Other Ambulatory Visit (HOSPITAL_COMMUNITY): Payer: 59 | Admitting: Licensed Clinical Social Worker

## 2020-06-21 ENCOUNTER — Other Ambulatory Visit (HOSPITAL_COMMUNITY): Payer: 59

## 2020-06-21 ENCOUNTER — Other Ambulatory Visit (HOSPITAL_COMMUNITY): Payer: 59 | Admitting: Occupational Therapy

## 2020-06-21 DIAGNOSIS — F410 Panic disorder [episodic paroxysmal anxiety] without agoraphobia: Secondary | ICD-10-CM | POA: Diagnosis not present

## 2020-06-21 DIAGNOSIS — R41844 Frontal lobe and executive function deficit: Secondary | ICD-10-CM

## 2020-06-21 DIAGNOSIS — F3181 Bipolar II disorder: Secondary | ICD-10-CM

## 2020-06-21 DIAGNOSIS — R4589 Other symptoms and signs involving emotional state: Secondary | ICD-10-CM

## 2020-06-21 NOTE — Therapy (Signed)
[place], [place], [address] Phone: [phone]   Fax:  [phone] Virtual Visit via Video Note  I connected with [name] on 06/21/20 at  11:00 AM EDT by a video enabled telemedicine application and verified that I am speaking with the correct person using two identifiers.   I discussed the limitations of evaluation and management by telemedicine and the availability of in person appointments. The patient expressed understanding and agreed to proceed.   I discussed the assessment and treatment plan with the patient. The patient was provided an opportunity to ask questions and all were answered. The patient agreed with the plan and demonstrated an understanding of the instructions.   The patient was advised to call back or seek an in-person evaluation if the symptoms worsen or if the condition fails to improve as anticipated.  Location: Patient - Patient [place]   I provided 115 minutes of non-face-to-face time during this encounter. 45 minutes - OT Evaluation 70 minutes - OT Group Therapy    [name], OT   Occupational Therapy Evaluation  Patient Details  Name: MARCELLENE SHIVLEY MRN: [id number] Date of Birth: [birth year] No data recorded  Encounter Date: 06/21/2020   OT End of Session - 06/21/20 1246    Visit Number 1    Number of Visits 20    Date for OT Re-Evaluation 07/19/20    Authorization Type [employer]    OT Start Time 1100   OT Eval [phone]   OT Stop Time 1210    OT Time Calculation (min) 70 min    Activity Tolerance Patient tolerated treatment well    Behavior During Therapy WFL for tasks assessed/performed           Past Medical History:  Diagnosis Date  . Abscess of left axilla - PREVOTELLA BIVIA & Staph Coag Neg 07/12/2012   MODERATE PREVOTELLA BIVIA Note: BETA LACTAMASE NEGATIVE    . Asthma    as a child  . Cancer ([place])    skin tag  rt breast  . Cellulitis 06/01/2014   RT INNER THIGH  . Depression    hx  . Diabetes ([place])   . Diabetes mellitus    IDDM, Insulin pump; followed by Dr. [name]  . GERD (gastroesophageal reflux disease)    no current meds.  . Heart murmur   . Hidradenitis 04/2012   bilat. thighs, left groin - open areas on thighs  . Hx MRSA infection   . Hydradenitis   . PCOS (polycystic ovarian syndrome)   . Pneumonia    hx  . SVT (supraventricular tachycardia) ([place]) 06/2017  . Vitamin D insufficiency     Past Surgical History:  Procedure Laterality Date  . BREAST EXCISIONAL BIOPSY Right   . BREAST SURGERY     right lumpectomy  . BUNIONECTOMY     left  . CARDIAC CATHETERIZATION    . CHOLECYSTECTOMY  12/02/2006   lap. chole.  Marland Kitchen DILATION AND EVACUATION  02/14/2007  . ESOPHAGOGASTRODUODENOSCOPY  11/17/2011   Procedure: ESOPHAGOGASTRODUODENOSCOPY (EGD);  Surgeon: [name], MD;  Location: [facility];  Service: Endoscopy;  Laterality: N/A;  . EYE SURGERY     exc. stye left eye  . HYDRADENITIS EXCISION  04/30/2012   Procedure: EXCISION HYDRADENITIS GROIN;  Surgeon: [name], MD;  Location: [facility];  Service: General;  Laterality: Left;  wide excision hidradenitis bilateral thighs and  Left groin  . HYDRADENITIS EXCISION Left 01/13/2014   Procedure: WIDE EXCISION HIDRADENITIS LEFT AXILLA;  Surgeon: [name], MD;  Location: [facility];  Service: General;  Laterality: Left;  . INCISION AND DRAINAGE ABSCESS Right 03/17/2019   Procedure: INCISION AND DRAINAGE RIGHT THIGH ABSCESS;  Surgeon: [name], MD;  Location: [facility];  Service: General;  Laterality: Right;  . IRRIGATION AND DEBRIDEMENT ABSCESS Right 06/02/2014   Procedure: IRRIGATION AND DEBRIDEMENT ABSCESS;  Surgeon: [name], MD;  Location: [facility];  Service: General;  Laterality: Right;  . IRRIGATION AND DEBRIDEMENT ABSCESS Left 09/23/2014   Procedure: IRRIGATION AND DEBRIDEMENT ABSCESS/LEFT  THIGH;  Surgeon: [name], MD;  Location: [facility];  Service: General;  Laterality: Left;  . LEFT HEART CATHETERIZATION WITH CORONARY ANGIOGRAM N/A 07/14/2014   Procedure: LEFT HEART CATHETERIZATION WITH CORONARY ANGIOGRAM;  Surgeon: [name], MD;  Location: [facility] CATH LAB;  Service: Cardiovascular;  Laterality: N/A;  . TONSILLECTOMY    . WISDOM TOOTH EXTRACTION      There were no vitals filed for this visit.   Subjective Assessment - 06/21/20 1245    Currently in Pain? No/denies    Multiple Pain Sites No                              OT Education - 06/21/20 1245    Education Details Educated on OT within [facility] programming along with  different communication styles with strategies to become more assertive with use of XYZ communication tool    Person(s) Educated Patient    Methods Explanation;Handout    Comprehension Verbalized understanding            OT Short Term Goals - 06/21/20 1250      OT SHORT TERM GOAL #1   Title Pt will actively engage in OT group sessions throughout duration of PHP programming, in order to promote daily structure, social engagement, and opportunities to develop and utilize adaptive strategies to maximize functional performance in preparation for safe transition and integration back into school, work, and the community.    Time 4    Period Weeks    Status New    Target Date 07/19/20      OT SHORT TERM GOAL #2   Title Pt will identify and implement 1-3 sleep hygiene strategies she can utilize, in order to improve sleep quality/ADL performance, in preparation for safe and healthy reintegration back into the community at discharge.    Time 4    Period Weeks    Status New    Target Date 07/19/20      OT SHORT TERM GOAL #3   Title Pt will demonstrate an increase in assertive communication skills, as evidenced by, identifying at least 2 "I" statements when sharing their feelings/needs, with min cues, in preparation for reintegration  back into the community at discharge.    Status New      OT SHORT TERM GOAL #4   Title Pt will practice and identify 1-3 adaptive coping strategies she can utilize, in order to safely manage increased depression/anxiety, with min cues, in preparation for safe and healthy reintegration back into the community at discharge.    Status New         Occupational Therapy Assessment 06/21/2020  Yonna is a 39 year old female with PMHx of bipolar II disorder, PTSD, generalized anxiety, and major depression who was admitted to [facility] as a transfer transitioning down  from IOP. Pt reports that she has gone through the IOP and PHP programs in the past, with noted benefit and is looking to "refresh" her skills. Pt presents as forthcoming, calm, cooperative, and engaged throughout initial OT interview and evaluation. Pt reports lack of interest and engagement and is unable to identify any interests or leisure activities. Pt identifies her goal within the program "refresh what I learned the last time and gain new coping skills."  Precautions/Limitations: None noted  Cognition: Appears intact   Visual Motor: Pt denies use of contacts/glasses or any other visual aids.    Living Situation: Pt lives with her 78 y/o daughter and is a single, full-time mother. Pt reports partner recently broke up and moved out within the last two months.   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 Pt has worked for [employer] in the past.   ADL/iADL Performance: Pt reports engagement in ADLs as a "4 or 5" and reports lack of motivation and interest. Pt gets out of bed to bring her daughter to school and take her insulin, otherwise difficulty engaging in house chores and other activities.    Leisure Interests and Hobbies: Pt denies any interest in leisure activities and hobbies. Pt reports past  engagement in bowling, going out with friends, taking her daughter out, and going to the movies, though reports panic attacks when attempting to engage in these activities as of current.   Social Support: Pt reports fall out with both her sister and Mom. Reports no social supports, aside from her current OP therapist, Romelle Starcher. Pt identifies a female friend that has been providing support, however appears guarded around this topic.   What do you do when you are very stressed, angry, upset, sad or anxious? Isolate from others, Yell/Scream and Cry   What helps when you are not feeling well? Going for a walk, Pacing the hall and Start (contained) fires  What are some things that make it MORE difficult for you when you are already upset? Being touched, Loud noises, Being criticized, Particular time of year and certain sounds (rattling of a chip bag)  Is there anything specific that you would like help with while you're in the hospital? Anger Management, Relationships, Stress Management and Self-esteem   What is your goal while you are here?  "refresh what I learned the last time and gain new coping skills."  Assessment: Pt demonstrates behavior that inhibits/restricts participation in occupation and would benefit from skilled occupational therapy services to address current difficulties with symptom management, emotion regulation, socialization, stress management, time management, job readiness, financial wellness, health and nutrition, sleep hygiene, ADL/iADL performance and leisure participation, in preparation for reintegration and return to community at discharge.   Plan: Pt will participate in skilled occupational therapy sessions (group and/or individual) in order to promote daily structure, social engagement, and opportunities to develop and utilize adaptive strategies to maximize functional performance in preparation for safe transition and integration back into school, work, and/or the  community at discharge. OT sessions will occur 4-5 x per week for 2-4 weeks.   [name], MOT, OTR/L  Group Session:  S: "I want to work on controlling my anger and not just lashing out on people."  O: O: Group began with a reflection from previous OT session focused on communication styles and group members re-iterated what was learned during previous session.  Members shared and reflected on any opportunities they were presented with last evening to practice their assertiveness skills or recognize patterns of communication observed. Today's group focused on assertiveness skills training and use of the XYZ* assertive communication tool was introduced. The XYZ communication tool states: I feel X when you do Y in situation Z and I would like _________. X is the emotion, Y is the specific behavior, and Z is the specific situation. Group members each formulated their own XYZ statement and shared with the group to discuss and offer feedback. Additional tips and strategies to practice being assertive were also introduced and discussed.   A: Cabela was active and independent in her participation of discussion and activity, sharing that she has difficulty controlling her anger when it comes to her communication skills. She appeared receptive and interested in learning strategies to be more assertive and worked through a real life example to create an XYZ statement sharing "I feel upset and disappointed when you don't clean up after yourself and I would like you to put your clothes in the laundry basket every day." She shared that she has difficulty navigating conversations with daughter at times, both in relation to her own frustration, and noting that her daughter has autism spectrum disorder and does not always understand what is being asked of her, especially as it relates to identifying feelings. Pt felt confident in her statement and expressed intent to use it this afternoon.   P: Continue to attend  PHP OT group sessions 5x week for 2 weeks to promote daily structure, social engagement, and opportunities to develop and utilize adaptive strategies to maximize functional performance in preparation for safe transition and integration back into school, work, and the community. Plan to address topic of fair fighting in next OT group session.    Plan - 06/21/20 1246    Clinical Impression Statement Pt is a 39 y/o female with PMHx of bipolar II, major depression, generalized anxiety, and PTSD who reports increased psychosocial stressors including recent break-up with significant other that involved partner moving out/away, taking care of her 37 year old daughter (who also struggles with MH and has an autism diagnosis, along with ADHD and ODD) as a single mother, difficult family relationships, work/life balance, and managing her mental health. Pt reports desire to engage in [facility] programming in order to manage identified stressors and to engage meaningfull in identified areas of occupation and ADL/iADLs.    OT Occupational Profile and History Problem Focused Assessment - Including review of records relating to presenting problem    Occupational performance deficits (Please refer to evaluation for details): ADL's;IADL's;Rest and Sleep;Education;Work;Leisure;Social Participation    Body Structure / Function / Physical Skills ADL    Cognitive Skills Attention;Consciousness;Emotional;Energy/Drive;Memory;Orientation;Perception;Problem Solve;Safety Awareness;Thought;Understand    Psychosocial Skills Coping Strategies;Environmental  Adaptations;Habits;Interpersonal Interaction;Routines and Behaviors    Rehab Potential Good    Clinical Decision Making Limited treatment options, no task modification necessary    Comorbidities Affecting Occupational Performance: May have comorbidities impacting occupational performance    Modification or Assistance to Complete Evaluation  No modification of tasks or assist necessary  to complete eval    OT Frequency 5x / week    OT Duration 4 weeks    OT Treatment/Interventions Self-care/ADL training;Patient/family education;Coping strategies training;Psychosocial skills training    Consulted and Agree with Plan of Care Patient           Patient will benefit from skilled therapeutic intervention in order to improve the following deficits and  impairments:   Body Structure / Function / Physical Skills: ADL Cognitive Skills: Attention, Consciousness, Emotional, Energy/Drive, Memory, Orientation, Perception, Problem Solve, Safety Awareness, Thought, Understand Psychosocial Skills: Coping Strategies, Environmental  Adaptations, Habits, Interpersonal Interaction, Routines and Behaviors   Visit Diagnosis: Difficulty coping  Frontal lobe and executive function deficit  Bipolar 2 disorder, major depressive episode ([place])    Problem List Patient Active Problem List   Diagnosis Date Noted  . Chronic posttraumatic stress disorder 04/21/2019  . DKA (diabetic ketoacidosis) ([place]) 03/15/2019  . Abscess of right thigh 03/15/2019  . Bipolar 2 disorder, major depressive episode ([place]) 01/04/2019  . Panic disorder 01/04/2019  . Enteritis 12/24/2017  . Intractable nausea and vomiting 12/24/2017  . Epigastric pain   . Other chest pain 09/28/2017  . TIA (transient ischemic attack) 09/28/2017  . Paroxysmal SVT (supraventricular tachycardia) ([place]) 06/24/2017  . Dizziness 06/24/2017  . Weakness 06/24/2017  . Hyperglycemia 03/17/2016  . Abscess 03/17/2016  . Chest discomfort 07/10/2014  . Cellulitis 06/01/2014  . Cellulitis of right thigh 06/01/2014  . Postop check 01/19/2014  . Smoking addiction 08/29/2013  . DKA, type 1 ([place]) 05/09/2013  . Hidradenitis suppurativa 04/21/2012  . Esophageal reflux 11/17/2011  . Seasonal and perennial allergic rhinitis 05/20/2010  . BRONCHITIS, ACUTE 11/29/2007  . HYPONATREMIA 11/14/2007  . GAD (generalized anxiety disorder) 10/24/2007    . Sinus tachycardia 07/07/2007  . Diabetes mellitus type I ([place]) 04/08/2007  . Smoker 04/08/2007  . 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 MIGRAINES, HX OF 04/08/2007   06/21/2020  [name], MOT, OTR/L  06/21/2020, 1:06 PM  [facility] [place], [place], [address] Phone: [phone]   Fax:  [phone]  Name: RONELLE MICHIE MRN: [id number] Date of Birth: 10-May-1981

## 2020-06-22 ENCOUNTER — Other Ambulatory Visit (HOSPITAL_COMMUNITY): Payer: 59

## 2020-06-22 ENCOUNTER — Other Ambulatory Visit: Payer: Self-pay

## 2020-06-22 ENCOUNTER — Other Ambulatory Visit (HOSPITAL_COMMUNITY): Payer: 59 | Attending: Psychiatry

## 2020-06-22 ENCOUNTER — Ambulatory Visit (HOSPITAL_COMMUNITY): Payer: 59

## 2020-06-22 ENCOUNTER — Other Ambulatory Visit (HOSPITAL_COMMUNITY): Payer: 59 | Admitting: Licensed Clinical Social Worker

## 2020-06-22 DIAGNOSIS — R45851 Suicidal ideations: Secondary | ICD-10-CM | POA: Diagnosis not present

## 2020-06-22 DIAGNOSIS — F3181 Bipolar II disorder: Secondary | ICD-10-CM

## 2020-06-22 DIAGNOSIS — Z888 Allergy status to other drugs, medicaments and biological substances status: Secondary | ICD-10-CM | POA: Insufficient documentation

## 2020-06-22 DIAGNOSIS — F410 Panic disorder [episodic paroxysmal anxiety] without agoraphobia: Secondary | ICD-10-CM | POA: Diagnosis not present

## 2020-06-22 DIAGNOSIS — Z885 Allergy status to narcotic agent status: Secondary | ICD-10-CM | POA: Insufficient documentation

## 2020-06-22 DIAGNOSIS — F17210 Nicotine dependence, cigarettes, uncomplicated: Secondary | ICD-10-CM | POA: Diagnosis not present

## 2020-06-22 DIAGNOSIS — Z733 Stress, not elsewhere classified: Secondary | ICD-10-CM | POA: Diagnosis not present

## 2020-06-22 DIAGNOSIS — Z566 Other physical and mental strain related to work: Secondary | ICD-10-CM | POA: Insufficient documentation

## 2020-06-22 DIAGNOSIS — Z79899 Other long term (current) drug therapy: Secondary | ICD-10-CM | POA: Diagnosis not present

## 2020-06-22 DIAGNOSIS — F32A Depression, unspecified: Secondary | ICD-10-CM | POA: Diagnosis not present

## 2020-06-23 ENCOUNTER — Encounter (HOSPITAL_COMMUNITY): Payer: Self-pay | Admitting: Family

## 2020-06-23 NOTE — Progress Notes (Addendum)
  [facility] Discharge Summary  TAHIRIH LAIR [id number]  Admission date:06/19/2020 Discharge date: 06/19/2020  Reason for admission: Worsening depression and anxiety related to multiple life stressors.  Progress in Program Toward Treatment Goals: Ongoing, Javayah will be discontinued from intensive outpatient programming (IOP) and starting partial hospitalization programming  (PHP) on 06/20/2020 for higher level of care.  Progress (rationale):  Take all medications as prescribed. Keep all follow-up appointments as scheduled.  Do not consume alcohol or use illegal drugs while on prescription medications. Report any adverse effects from your medications to your primary care provider promptly.  In the event of recurrent symptoms or worsening symptoms, call 911, a crisis hotline, or go to the nearest emergency department for evaluation.     Derrill Center, NP 06/23/2020

## 2020-06-25 ENCOUNTER — Telehealth (HOSPITAL_COMMUNITY): Payer: Self-pay | Admitting: Licensed Clinical Social Worker

## 2020-06-25 ENCOUNTER — Ambulatory Visit (HOSPITAL_COMMUNITY): Payer: 59

## 2020-06-25 ENCOUNTER — Other Ambulatory Visit: Payer: Self-pay

## 2020-06-25 ENCOUNTER — Other Ambulatory Visit (HOSPITAL_COMMUNITY): Payer: 59

## 2020-06-26 ENCOUNTER — Other Ambulatory Visit (HOSPITAL_COMMUNITY): Payer: 59 | Admitting: Licensed Clinical Social Worker

## 2020-06-26 ENCOUNTER — Other Ambulatory Visit (HOSPITAL_COMMUNITY): Payer: 59

## 2020-06-26 ENCOUNTER — Ambulatory Visit (HOSPITAL_COMMUNITY): Payer: 59

## 2020-06-26 ENCOUNTER — Other Ambulatory Visit: Payer: Self-pay

## 2020-06-26 ENCOUNTER — Encounter (HOSPITAL_COMMUNITY): Payer: Self-pay | Admitting: Family

## 2020-06-26 ENCOUNTER — Other Ambulatory Visit (HOSPITAL_COMMUNITY): Payer: 59 | Admitting: Occupational Therapy

## 2020-06-26 ENCOUNTER — Encounter (HOSPITAL_COMMUNITY): Payer: Self-pay

## 2020-06-26 DIAGNOSIS — R4589 Other symptoms and signs involving emotional state: Secondary | ICD-10-CM

## 2020-06-26 DIAGNOSIS — F332 Major depressive disorder, recurrent severe without psychotic features: Secondary | ICD-10-CM

## 2020-06-26 DIAGNOSIS — F3181 Bipolar II disorder: Secondary | ICD-10-CM

## 2020-06-26 DIAGNOSIS — R41844 Frontal lobe and executive function deficit: Secondary | ICD-10-CM

## 2020-06-26 DIAGNOSIS — F32A Depression, unspecified: Secondary | ICD-10-CM | POA: Diagnosis not present

## 2020-06-26 NOTE — Progress Notes (Signed)
Virtual Visit via Video Note  I connected with [name] on 06/26/20 at  9:00 AM EDT by a video enabled telemedicine application and verified that I am speaking with the correct person using two identifiers.   I discussed the limitations of evaluation and management by telemedicine and the availability of in person appointments. The patient expressed understanding and agreed to proceed.   I discussed the assessment and treatment plan with the patient. The patient was provided an opportunity to ask questions and all were answered. The patient agreed with the plan and demonstrated an understanding of the instructions.   The patient was advised to call back or seek an in-person evaluation if the symptoms worsen or if the condition fails to improve as anticipated.  I provided 15 minutes of non-face-to-face time during this encounter.   Derrill Center, NP   BH MD/PA/NP OP Progress Note  06/26/2020 12:08 PM [name]  MRN:  [id number]  Evaluation: [name] was seen and evaluated via WebEx.  She is alert and oriented x3.  Patient reported worsening depression due to current life stressors.  Patient reports situational events causing her depression to worsen.  States " I was just happy on Friday now not doing good today". [name] cites disagreement between she and her ex-boyfriend, which caused her moods to fluctuate.  Rates her depression 10 out of 10 with 10 being the worst. Reported "okay" weekend stated " I keep getting knocked back down." States she continued to take some medication as directed.  Patient was recently initiated on Trintellix by her attending psychiatrist denied any medication side effects. "  I have not seen any improvement as of yet." denying suicidal or homicidal ideations.  Denies auditory or visual hallucinations.  Reports worsening panic/anxiety attacks on her bad days.  Patient to consider DBT therapy.  [name] appears to be mood dependent.  Staff to continue to monitor  for safety.  Support, encouragement and reassurance was provided.  Visit Diagnosis: No diagnosis found.  Past Psychiatric History:   Past Medical History:  Past Medical History:  Diagnosis Date  . Abscess of left axilla - PREVOTELLA BIVIA & Staph Coag Neg 07/12/2012   MODERATE PREVOTELLA BIVIA Note: BETA LACTAMASE NEGATIVE    . Asthma    as a child  . Cancer ([place])    skin tag rt breast  . Cellulitis 06/01/2014   RT INNER THIGH  . Depression    hx  . Diabetes ([place])   . Diabetes mellitus    IDDM, Insulin pump; followed by Dr. [name]  . GERD (gastroesophageal reflux disease)    no current meds.  . Heart murmur   . Hidradenitis 04/2012   bilat. thighs, left groin - open areas on thighs  . Hx MRSA infection   . Hydradenitis   . PCOS (polycystic ovarian syndrome)   . Pneumonia    hx  . SVT (supraventricular tachycardia) ([place]) 06/2017  . Vitamin D insufficiency     Past Surgical History:  Procedure Laterality Date  . BREAST EXCISIONAL BIOPSY Right   . BREAST SURGERY     right lumpectomy  . BUNIONECTOMY     left  . CARDIAC CATHETERIZATION    . CHOLECYSTECTOMY  12/02/2006   lap. chole.  Marland Kitchen DILATION AND EVACUATION  02/14/2007  . ESOPHAGOGASTRODUODENOSCOPY  11/17/2011   Procedure: ESOPHAGOGASTRODUODENOSCOPY (EGD);  Surgeon: [name], MD;  Location: [facility];  Service: Endoscopy;  Laterality: N/A;  . EYE SURGERY  exc. stye left eye  . HYDRADENITIS EXCISION  04/30/2012   Procedure: EXCISION HYDRADENITIS GROIN;  Surgeon: [name], MD;  Location: [place];  Service: General;  Laterality: Left;  wide excision hidradenitis bilateral thighs and Left groin  . HYDRADENITIS EXCISION Left 01/13/2014   Procedure: WIDE EXCISION HIDRADENITIS LEFT AXILLA;  Surgeon: [name], MD;  Location: [place];  Service: General;  Laterality: Left;  . INCISION AND DRAINAGE ABSCESS Right 03/17/2019   Procedure: INCISION AND DRAINAGE RIGHT THIGH  ABSCESS;  Surgeon: [name], MD;  Location: [place];  Service: General;  Laterality: Right;  . IRRIGATION AND DEBRIDEMENT ABSCESS Right 06/02/2014   Procedure: IRRIGATION AND DEBRIDEMENT ABSCESS;  Surgeon: [name], MD;  Location: [place];  Service: General;  Laterality: Right;  . IRRIGATION AND DEBRIDEMENT ABSCESS Left 09/23/2014   Procedure: IRRIGATION AND DEBRIDEMENT ABSCESS/LEFT THIGH;  Surgeon: [name], MD;  Location: [place];  Service: General;  Laterality: Left;  . LEFT HEART CATHETERIZATION WITH CORONARY ANGIOGRAM N/A 07/14/2014   Procedure: LEFT HEART CATHETERIZATION WITH CORONARY ANGIOGRAM;  Surgeon: [name], MD;  Location: [facility] CATH LAB;  Service: Cardiovascular;  Laterality: N/A;  . TONSILLECTOMY    . WISDOM TOOTH EXTRACTION      Family Psychiatric History:   Family History:  Family History  Problem Relation Age of Onset  . Cancer Mother   . Anxiety disorder Mother   . Depression Mother   . Sexual abuse Mother   . Breast cancer Mother 60  . Hyperlipidemia Sister   . Hypertension Sister   . Sexual abuse Sister   . Hypertension Father   . Anxiety disorder Father   . Depression Father   . Drug abuse Father   . Stroke Paternal Uncle   . ADD / ADHD Paternal Uncle   . Anxiety disorder Paternal Uncle   . Anxiety disorder Maternal Aunt   . Seizures Maternal Aunt   . Anxiety disorder Paternal Aunt   . Anxiety disorder Maternal Uncle   . ADD / ADHD Cousin   . ADD / ADHD Daughter     Social History:  Social History   Socioeconomic History  . Marital status: Single    Spouse name: Not on file  . Number of children: Not on file  . Years of education: Not on file  . Highest education level: Not on file  Occupational History  . Not on file  Tobacco Use  . Smoking status: Current Every Day Smoker    Packs/day: 1.00    Years: 23.00    Pack years: 23.00    Types: Cigarettes  . Smokeless tobacco: Never Used  . Tobacco comment: Has cut back to 1/2 to  3/4 and wants to begin useing patches again once anxiety subsides  Vaping Use  . Vaping Use: Never used  Substance and Sexual Activity  . Alcohol use: No    Alcohol/week: 0.0 standard drinks  . Drug use: Yes    Types: Marijuana    Comment: Last use 3 weeks prior   . Sexual activity: Yes    Partners: Male    Birth control/protection: I.U.D.  Other Topics Concern  . Not on file  Social History Narrative  . Not on file   Social Determinants of Health   Financial Resource Strain:   . Difficulty of Paying Living Expenses: Not on file  Food Insecurity:   . Worried About Charity fundraiser in the Last Year: Not on file  .  Ran Out of Food in the Last Year: Not on file  Transportation Needs:   . Lack of Transportation (Medical): Not on file  . Lack of Transportation (Non-Medical): Not on file  Physical Activity:   . Days of Exercise per Week: Not on file  . Minutes of Exercise per Session: Not on file  Stress:   . Feeling of Stress : Not on file  Social Connections:   . Frequency of Communication with Friends and Family: Not on file  . Frequency of Social Gatherings with Friends and Family: Not on file  . Attends Religious Services: Not on file  . Active Member of Clubs or Organizations: Not on file  . Attends Archivist Meetings: Not on file  . Marital Status: Not on file    Allergies:  Allergies  Allergen Reactions  . Latex Hives, Shortness Of Breath and Rash  . Levofloxacin Shortness Of Breath and Rash  . Moxifloxacin Shortness Of Breath and Rash  . Oxycodone-Acetaminophen Shortness Of Breath, Swelling and Rash    NORCO/VICODIN OK  . Peach [Prunus Persica] Hives and Shortness Of Breath  . Potassium-Containing Compounds Other (See Comments)    IV ROUTE - CAUSES VEINS TO COLLAPS; Reports that it is undiluted K only  . Prednisone Other (See Comments)    SEVERE ELEVATION OF BLOOD SUGAR. Able to tolerate 40 mg  . Propoxyphene N-Acetaminophen Swelling    SWELLING  OF FACE AND THROAT  . Rosiglitazone Maleate Swelling    SWELLING OF FACE AND LEGS  . Xolair [Omalizumab] Other (See Comments)    Rash and anaphylaxis  . Adhesive [Tape] Hives, Itching and Rash  . Morphine And Related Other (See Comments)    Causes hallucinations  . Prozac [Fluoxetine Hcl] Other (See Comments)    Made her very aggressive   . Chantix [Varenicline]     dreams  . Citrullus Vulgaris Nausea And Vomiting    Facial swelling  . Clindamycin/Lincomycin   . Humira [Adalimumab]     Does not remember   . Septra [Sulfamethoxazole-Trimethoprim]   . Zyvox [Linezolid]     Edema   . Cefaclor Rash  . Keflex [Cephalexin] Diarrhea and Rash    REACTION: severe migraine  . Promethazine Hcl Other (See Comments)    IV ROUTE ONLY - JITTERY FEELING. Patient reports that it is mild and she has used promethazine since then  PO tablet ok  . Sulfadiazine Rash    Metabolic Disorder Labs: Lab Results  Component Value Date   HGBA1C 10.6 (H) 10/04/2018   MPG 280.48 12/25/2017   MPG 246 (H) 06/01/2014   No results found for: PROLACTIN Lab Results  Component Value Date   CHOL 193 03/22/2018   TRIG 90.0 03/22/2018   HDL 62.80 03/22/2018   CHOLHDL 3 03/22/2018   VLDL 18.0 03/22/2018   LDLCALC 113 (H) 03/22/2018   LDLCALC 88 12/19/2015   Lab Results  Component Value Date   TSH 1.43 03/22/2018   TSH 0.773 12/25/2017    Therapeutic Level Labs: No results found for: LITHIUM No results found for: VALPROATE No components found for:  CBMZ  Current Medications: Current Outpatient Medications  Medication Sig Dispense Refill  . acetaminophen (TYLENOL) 500 MG tablet Take 2 tablets (1,000 mg total) by mouth every 6 (six) hours as needed for mild pain or fever.    Marland Kitchen albuterol (PROVENTIL) (2.5 MG/3ML) 0.083% nebulizer solution Take 3 mLs (2.5 mg total) by nebulization every 6 (six) hours as needed for wheezing  or shortness of breath. 75 mL 12  . azithromycin (ZITHROMAX) 250 MG tablet 2  today then one daily 6 tablet 0  . clonazePAM (KLONOPIN) 0.5 MG tablet Take 1 tablet (0.5 mg total) by mouth 3 (three) times daily as needed for anxiety (sleep). 90 tablet 2  . doxycycline (VIBRA-TABS) 100 MG tablet TAKE 1 TABLET BY MOUTH 2 TIMES DAILY. (Patient taking differently: Take 100 mg by mouth 2 (two) times daily. ) 14 tablet prn  . Eszopiclone 3 MG TABS Take 1 tablet (3 mg total) by mouth at bedtime as needed. Take immediately before bedtime 15 tablet 2  . glucose blood ([employer] TEST STRIPS) test strip 1 each by Other route See admin instructions.     Marland Kitchen HUMALOG 100 UNIT/ML injection 20-22 units pre-meal as per sliding scale 10 mL 11  . insulin glargine (LANTUS) 100 UNIT/ML injection INJECT 60 UNITS UNDER THE SKIN ONCE DAILY AS DIRECTED 60 mL 3  . lamoTRIgine (LAMICTAL) 200 MG tablet Take 1 tablet (200 mg total) by mouth daily. 30 tablet 2  . levonorgestrel (MIRENA) 20 MCG/24HR IUD 1 each by Intrauterine route once. Placed 04/2013    . mupirocin ointment (BACTROBAN) 2 % Apply 1 application topically daily as needed.  2  . ondansetron (ZOFRAN ODT) 8 MG disintegrating tablet Take 1 tablet (8 mg total) by mouth every 8 (eight) hours as needed for nausea or vomiting. 20 tablet 1  . pantoprazole (PROTONIX) 40 MG tablet Take 1 tablet (40 mg total) by mouth daily. 90 tablet 1  . prazosin (MINIPRESS) 2 MG capsule Take 1 capsule (2 mg total) by mouth at bedtime. 90 capsule 0  . spironolactone (ALDACTONE) 100 MG tablet Take 1 tablet (100 mg total) by mouth daily. 30 tablet 3  . traMADol (ULTRAM) 50 MG tablet Take 1 tablet (50 mg total) by mouth every 6 (six) hours as needed for moderate pain. 30 tablet 0  . vortioxetine HBr (TRINTELLIX) 10 MG TABS tablet Take 1 tablet (10 mg total) by mouth daily. 30 tablet 2   No current facility-administered medications for this visit.     Musculoskeletal:   Psychiatric Specialty Exam: Review of Systems  There were no vitals taken for this visit.There is no  height or weight on file to calculate BMI.  General Appearance: Casual  Eye Contact:  Good  Speech:  Clear and Coherent  Volume:  Normal  Mood:  Anxious and Depressed  Affect:  Congruent  Thought Process:  Coherent  Orientation:  Full (Time, Place, and Person)  Thought Content: Logical   Suicidal Thoughts:  No  Homicidal Thoughts:  No  Memory:  Immediate;   Fair Remote;   Fair  Judgement:  Fair  Insight:  Fair  Psychomotor Activity:  Normal  Concentration:  Concentration: Fair  Recall:  [employer] of Knowledge: Fair  Language: Fair  Akathisia:  No  Handed:  Right  AIMS (if indicated):   Assets:  Communication Skills Desire for Improvement Social Support  ADL's:  Intact  Cognition: WNL  Sleep:  Fair   Screenings: GAD-7     Office Visit from 04/04/2019 in [facility] from 12/14/2018 in [facility] from 11/24/2018 in [facility]  Total GAD-7 Score 15 17 19     PHQ2-9     Office Visit from 04/04/2019 in [facility] from 12/14/2018 in [facility] from 11/24/2018  in [place] [facility] from 03/14/2015 in [facility]  PHQ-2 Total Score 4 4 6 1   PHQ-9 Total Score 16 19 24  --       Assessment and Plan:  Continue partial hospitalization program Continue medications as directed -Consider follow-up with DBT therapy  Treatment plan was reviewed and agreed upon by NP T. Bobby Rumpf and patient [name] need for continued group services   Derrill Center, NP 06/26/2020, 12:08 PM

## 2020-06-26 NOTE — Therapy (Signed)
[place], [place], [address] Phone: [phone]   Fax:  [phone] Virtual Visit via Video Note  I connected with [name] on 06/26/20 at  11:00 AM EDT by a video enabled telemedicine application and verified that I am speaking with the correct person using two identifiers.   I discussed the limitations of evaluation and management by telemedicine and the availability of in person appointments. The patient expressed understanding and agreed to proceed.    I discussed the assessment and treatment plan with the patient. The patient was provided an opportunity to ask questions and all were answered. The patient agreed with the plan and demonstrated an understanding of the instructions.   The patient was advised to call back or seek an in-person evaluation if the symptoms worsen or if the condition fails to improve as anticipated.  Location: [facility] - [facility] [place]  I provided 60 minutes of non-face-to-face time during this encounter.   [name], OT   Occupational Therapy Treatment  Patient Details  Name: ROSALEEN MAZER MRN: [id number] Date of Birth: 1981-09-14 Referring Provider (OT): [name]   Encounter Date: 06/26/2020   OT End of Session - 06/26/20 1219    Visit Number 2    Number of Visits 20    Date for OT Re-Evaluation 07/19/20    Authorization Type [employer]    OT Start Time 1115    OT Stop Time 1215    OT Time Calculation (min) 60 min    Activity Tolerance Patient tolerated treatment well    Behavior During Therapy [facility] for tasks assessed/performed           Past Medical History:  Diagnosis Date  . Abscess of left axilla - PREVOTELLA BIVIA & Staph Coag Neg 07/12/2012   MODERATE PREVOTELLA BIVIA Note: BETA LACTAMASE NEGATIVE    . Asthma    as a child  . Cancer ([place])    skin tag rt breast  . Cellulitis 06/01/2014   RT INNER THIGH   . Depression    hx  . Diabetes ([place])   . Diabetes mellitus    IDDM, Insulin pump; followed by Dr. [name]  . GERD (gastroesophageal reflux disease)    no current meds.  . Heart murmur   . Hidradenitis 04/2012   bilat. thighs, left groin - open areas on thighs  . Hx MRSA infection   . Hydradenitis   . PCOS (polycystic ovarian syndrome)   . Pneumonia    hx  . SVT (supraventricular tachycardia) ([place]) 06/2017  . Vitamin D insufficiency     Past Surgical History:  Procedure Laterality Date  . BREAST EXCISIONAL BIOPSY Right   . BREAST SURGERY     right lumpectomy  . BUNIONECTOMY     left  . CARDIAC CATHETERIZATION    . CHOLECYSTECTOMY  12/02/2006   lap. chole.  Marland Kitchen DILATION AND EVACUATION  02/14/2007  . ESOPHAGOGASTRODUODENOSCOPY  11/17/2011   Procedure: ESOPHAGOGASTRODUODENOSCOPY (EGD);  Surgeon: [name], MD;  Location: [facility];  Service: Endoscopy;  Laterality: N/A;  . EYE SURGERY     exc. stye left eye  . HYDRADENITIS EXCISION  04/30/2012   Procedure: EXCISION HYDRADENITIS GROIN;  Surgeon: [name], MD;  Location: [facility] [place];  Service: General;  Laterality: Left;  wide excision hidradenitis bilateral thighs and Left groin  . HYDRADENITIS EXCISION Left 01/13/2014   Procedure: WIDE EXCISION  HIDRADENITIS LEFT AXILLA;  Surgeon: [name], MD;  Location: [place];  Service: General;  Laterality: Left;  . INCISION AND DRAINAGE ABSCESS Right 03/17/2019   Procedure: INCISION AND DRAINAGE RIGHT THIGH ABSCESS;  Surgeon: [name], MD;  Location: [place];  Service: General;  Laterality: Right;  . IRRIGATION AND DEBRIDEMENT ABSCESS Right 06/02/2014   Procedure: IRRIGATION AND DEBRIDEMENT ABSCESS;  Surgeon: [name], MD;  Location: [place];  Service: General;  Laterality: Right;  . IRRIGATION AND DEBRIDEMENT ABSCESS Left 09/23/2014   Procedure: IRRIGATION AND DEBRIDEMENT ABSCESS/LEFT THIGH;  Surgeon: [name], MD;  Location: [place];   Service: General;  Laterality: Left;  . LEFT HEART CATHETERIZATION WITH CORONARY ANGIOGRAM N/A 07/14/2014   Procedure: LEFT HEART CATHETERIZATION WITH CORONARY ANGIOGRAM;  Surgeon: [name], MD;  Location[facility] C[facility] LAB;  Service: Cardiovascular;  Laterality: N/A;  . TONSILLECTOMY    . WISDOM TOOTH EXTRACTION      There were no vitals filed for this visit.   Subjective Assessment - 06/26/20 1218    Currently in Pain? No/denies    Multiple Pain Sites No                                OT Education - 06/26/20 1219    Education Details Educated on different factors that contribute to our ability to manage our time, along with specific time management tips/strategies    Person(s) Educated Patient    Methods Explanation;Handout    Comprehension Verbalized understanding            OT Short Term Goals - 06/26/20 1220      OT SHORT TERM GOAL #1   Status On-going      OT SHORT TERM GOAL #2   Status On-going      OT SHORT TERM GOAL #3   Status On-going      OT SHORT TERM GOAL #4   Status On-going         Group Session:  S: None noted*  O: Group began with a warm-up activity, where group members were encouraged to reflect on the question "If you had $86,400 and only had 24 hours to spend it, how would you use the money?" Today's group focused on topic of time management and group members looked at how, similarly to the warm-up, how we spend our time effectively or ineffectively, knowing it is not something we can save or get back. Group members identified ways in which they currently struggle with managing their time and discussion focused on alternative strategies to recognize how we can be more productive and intentional with our time and managing it appropriately.   A: Caeleigh was late to join virtual session, signing on midway through group, and demonstrated minimal engagement in discussion and activity. Pt verbalized that she was present and listening,  however did not participate in discussion and was not present for warm-up activity. Pt was provided education on effective time management strategies while present in session. Plan to follow up with patient and talk with treatment team in the AM to discuss patient's willingness and motivation to engage in [facility] programming.   P: Continue to attend PHP OT group sessions 5x week for 2 weeks to promote daily structure, social engagement, and opportunities to develop and utilize adaptive strategies to maximize functional performance in preparation for safe transition and integration back into school, work, and the community. Plan to continue  topic of time management in next OT group session.  Plan - 06/26/20 1219    Occupational performance deficits (Please refer to evaluation for details): ADL's;IADL's;Rest and Sleep;Education;Work;Leisure;Social Participation    Body Structure / Function / Physical Skills ADL    Cognitive Skills Attention;Consciousness;Emotional;Energy/Drive;Memory;Orientation;Perception;Problem Solve;Safety Awareness;Thought;Understand    Psychosocial Skills Coping Strategies;Environmental  Adaptations;Habits;Interpersonal Interaction;Routines and Behaviors           Patient will benefit from skilled therapeutic intervention in order to improve the following deficits and impairments:   Body Structure / Function / Physical Skills: ADL Cognitive Skills: Attention, Consciousness, Emotional, Energy/Drive, Memory, Orientation, Perception, Problem Solve, Safety Awareness, Thought, Understand Psychosocial Skills: Coping Strategies, Environmental  Adaptations, Habits, Interpersonal Interaction, Routines and Behaviors   Visit Diagnosis: Difficulty coping  Frontal lobe and executive function deficit  Bipolar 2 disorder, major depressive episode ([place])    Problem List Patient Active Problem List   Diagnosis Date Noted  . Chronic posttraumatic stress disorder 04/21/2019  . DKA  (diabetic ketoacidosis) ([place]) 03/15/2019  . Abscess of right thigh 03/15/2019  . Bipolar 2 disorder, major depressive episode ([place]) 01/04/2019  . Panic disorder 01/04/2019  . Enteritis 12/24/2017  . Intractable nausea and vomiting 12/24/2017  . Epigastric pain   . Other chest pain 09/28/2017  . TIA (transient ischemic attack) 09/28/2017  . Paroxysmal SVT (supraventricular tachycardia) ([place]) 06/24/2017  . Dizziness 06/24/2017  . Weakness 06/24/2017  . Hyperglycemia 03/17/2016  . Abscess 03/17/2016  . Chest discomfort 07/10/2014  . Cellulitis 06/01/2014  . Cellulitis of right thigh 06/01/2014  . Postop check 01/19/2014  . Smoking addiction 08/29/2013  . DKA, type 1 ([place]) 05/09/2013  . Hidradenitis suppurativa 04/21/2012  . Esophageal reflux 11/17/2011  . Seasonal and perennial allergic rhinitis 05/20/2010  . BRONCHITIS, ACUTE 11/29/2007  . HYPONATREMIA 11/14/2007  . GAD (generalized anxiety disorder) 10/24/2007  . Sinus tachycardia 07/07/2007  . Diabetes mellitus type I ([place]) 04/08/2007  . Smoker 04/08/2007  . 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 MIGRAINES, HX OF 04/08/2007    06/26/2020  [name], MOT, OTR/L  06/26/2020, 12:20 PM  [facility] HOSPITALIZATION PROGRAM [place], [place], [address] Phone: [phone]   Fax:  [phone]  Name: AWANDA WILCOCK MRN: [id number] Date of Birth: 1980-11-27

## 2020-06-26 NOTE — Psych (Signed)
[name] is a 39 y.o. female patient with trauma and depression symptoms. Pt is scheduled to begin PHP today and presented an hour late. Cln informed pt she cannot enter group at that time per group commitments and pt reports understanding. Cln assessed for immediate safety needs and pt denies SI/HI/AVH. Pt will begin group tomorrow.     Lorin Glass, LCSW

## 2020-06-27 ENCOUNTER — Ambulatory Visit (HOSPITAL_COMMUNITY): Payer: 59

## 2020-06-27 ENCOUNTER — Other Ambulatory Visit: Payer: Self-pay

## 2020-06-27 ENCOUNTER — Other Ambulatory Visit (HOSPITAL_COMMUNITY): Payer: 59 | Admitting: Occupational Therapy

## 2020-06-27 ENCOUNTER — Other Ambulatory Visit (HOSPITAL_COMMUNITY): Payer: 59

## 2020-06-27 ENCOUNTER — Other Ambulatory Visit (HOSPITAL_COMMUNITY): Payer: 59 | Admitting: Licensed Clinical Social Worker

## 2020-06-27 ENCOUNTER — Encounter (HOSPITAL_COMMUNITY): Payer: Self-pay

## 2020-06-27 DIAGNOSIS — F32A Depression, unspecified: Secondary | ICD-10-CM | POA: Diagnosis not present

## 2020-06-27 DIAGNOSIS — R41844 Frontal lobe and executive function deficit: Secondary | ICD-10-CM

## 2020-06-27 DIAGNOSIS — F3181 Bipolar II disorder: Secondary | ICD-10-CM

## 2020-06-27 DIAGNOSIS — F332 Major depressive disorder, recurrent severe without psychotic features: Secondary | ICD-10-CM

## 2020-06-27 DIAGNOSIS — R4589 Other symptoms and signs involving emotional state: Secondary | ICD-10-CM

## 2020-06-27 NOTE — Psych (Signed)
Virtual Visit via Video Note  I connected with [name] on 06/21/20 at  9:00 AM EDT by a video enabled telemedicine application and verified that I am speaking with the correct person using two identifiers.  Location: Patient: Patient Home Provider: Clinical Home Office   I discussed the limitations of evaluation and management by telemedicine and the availability of in person appointments. The patient expressed understanding and agreed to proceed.  I discussed the assessment and treatment plan with the patient. The patient was provided an opportunity to ask questions and all were answered. The patient agreed with the plan and demonstrated an understanding of the instructions.   The patient was advised to call back or seek an in-person evaluation if the symptoms worsen or if the condition fails to improve as anticipated.  Pt was provided 240 minutes of non-face-to-face time during this encounter.   Lorin Glass, LCSW    [facility] PHP THERAPIST PROGRESS NOTE  [name] [id number]  Session Time: 9:00 - 10:00  Participation Level: Active  Behavioral Response: CasualAlertDepressed  Type of Therapy: Group Therapy  Treatment Goals addressed: Coping  Interventions: CBT, DBT, Supportive and Reframing  Summary: Clinician led check-in regarding current stressors and situation, and review of patient completed daily inventory. Clinician utilized active listening and empathetic response and validated patient emotions. Clinician facilitated processing group on pertinent issues.   Therapist Response: [name] is a 39 y.o. female who presents with depression symptoms.  Patient arrived within time allowed and reports that she is feeling "not too good." Patient rates hermood at a3on a scale of 1-10 with 10 being great. Pt reports she is not sleeping well and reports less than 4 hours a night recently. Pt states a friend is staying with her to increase sense of safety. Pt  reports struggles with hygiene and being in public. Pt states "sitting around" most of the day. Pt able to process. Pt minimally engaged in discussion.     Session Time: 10:00 - 11:00   Participation Level: Active  Behavioral Response: CasualAlertDepressed  Type of Therapy: Group Therapy  Treatment Goals addressed: Coping  Interventions: CBT, DBT, Supportive and Reframing  Summary: Cln led discussion on feeling like a burden. Group members shared struggles they experience with feeling indebted to others or like they are "too much." Cln introduced "best friend test" and challenged group members to extend kindness to themselves as they would a loved one.   Therapist Response: Pt engaged in discussion and reports this is a big issue for her and she has "always" felt like she is a burden to others. Pt is able to process. Pt is able to make a kindness statement towards herself.      Session Time: 11:00- 12:00  Participation Level:Active  Behavioral Response:CasualAlertDepressed  Type of Therapy: Group Therapy, OT  Treatment Goals addressed: Coping  Interventions:Psychosocial skills training, Supportive  Summary:Occupational Therapy group  Therapist Response:Patient engaged in group. See OT note.      Session Time: 12:00 -1:00  Participation Level:Active  Behavioral Response:CasualAlertDepressed  Type of Therapy:Group therapy  Treatment Goals addressed: Coping  Interventions:CBT; Solution focused; Supportive; Reframing  Summary:12:00 - 12:50: Cln continued topic of CBT cognitive distortions and group reviewed common distortions examples, utilizing handout "Cognitive distortions" to aid discussion. Group discussed personal examples of each common distortion to increase awareness to be able to "catch" the thought.  12:50 -1:00 Clinician led check-out. Clinician assessed for immediate needs, medication compliance and efficacy, and  safety  concerns  Therapist Response:12:00 - 12:50:  Pt engaged in discussion and identifies personalization and catastrophizing as particularly problematic for her.   12:50 - 1:00: At check-out, patientrates hermood at a6.5on a scale of 1-10 with 10 being great.Pt reports afternoon plans of doing laundry and picking up her daughter. Pt demonstrates some progress as evidenced byparticipating in first session. Patient denies SI/HI/self-harm at the end of group.    Suicidal/Homicidal: Nowithout intent/plan  Plan: Pt will continue in PHP while working to decrease depression symptoms, increase emotional regulation, and increase ability to manage symptoms in a healthy manner.   Diagnosis: Bipolar 2 disorder, major depressive episode ([place]) [F31.81]    1. Bipolar 2 disorder, major depressive episode ([place])       Lorin Glass, LCSW 06/27/2020

## 2020-06-27 NOTE — Therapy (Signed)
[place], [place], [address] Phone: [phone]   Fax:  [phone] Virtual Visit via Video Note  I connected with [name] on 06/27/20 at  12:00 PM EDT by a video enabled telemedicine application and verified that I am speaking with the correct person using two identifiers.   I discussed the limitations of evaluation and management by telemedicine and the availability of in person appointments. The patient expressed understanding and agreed to proceed.    I discussed the assessment and treatment plan with the patient. The patient was provided an opportunity to ask questions and all were answered. The patient agreed with the plan and demonstrated an understanding of the instructions.   The patient was advised to call back or seek an in-person evaluation if the symptoms worsen or if the condition fails to improve as anticipated Location: Patient - Patient [place]   I provided 50 minutes of non-face-to-face time during this encounter.   [name], OT   Occupational Therapy Treatment  Patient Details  Name: [name] MRN: [id number] Date of Birth: 07-20-1981 Referring Provider (OT): [name]   Encounter Date: 06/27/2020   OT End of Session - 06/27/20 1535    Visit Number 3    Number of Visits 20    Date for OT Re-Evaluation 07/19/20    Authorization Type [employer]    OT Start Time 1200    OT Stop Time 1250    OT Time Calculation (min) 50 min    Activity Tolerance Other (comment)   Pt logged out of OT session early at 12:35   Behavior During Therapy --   UTA - Pt non-verbal and camera was not on          Past Medical History:  Diagnosis Date  . Abscess of left axilla - PREVOTELLA BIVIA & Staph Coag Neg 07/12/2012   MODERATE PREVOTELLA BIVIA Note: BETA LACTAMASE NEGATIVE    . Asthma    as a child  . Cancer ([place])    skin tag rt breast  .  Cellulitis 06/01/2014   RT INNER THIGH  . Depression    hx  . Diabetes ([place])   . Diabetes mellitus    IDDM, Insulin pump; followed by Dr. [name]  . GERD (gastroesophageal reflux disease)    no current meds.  . Heart murmur   . Hidradenitis 04/2012   bilat. thighs, left groin - open areas on thighs  . Hx MRSA infection   . Hydradenitis   . PCOS (polycystic ovarian syndrome)   . Pneumonia    hx  . SVT (supraventricular tachycardia) ([place]) 06/2017  . Vitamin D insufficiency     Past Surgical History:  Procedure Laterality Date  . BREAST EXCISIONAL BIOPSY Right   . BREAST SURGERY     right lumpectomy  . BUNIONECTOMY     left  . CARDIAC CATHETERIZATION    . CHOLECYSTECTOMY  12/02/2006   lap. chole.  Marland Kitchen DILATION AND EVACUATION  02/14/2007  . ESOPHAGOGASTRODUODENOSCOPY  11/17/2011   Procedure: ESOPHAGOGASTRODUODENOSCOPY (EGD);  Surgeon: [name], MD;  [facility];  Service: Endoscopy;  Laterality: N/A;  . EYE SURGERY     exc. stye left eye  . HYDRADENITIS EXCISION  04/30/2012   Procedure: EXCISION HYDRADENITIS GROIN;  Surgeon: [name], MD;  Location: [place] [facility];  Service: General;  Laterality: Left;  wide excision hidradenitis bilateral  thighs and Left groin  . HYDRADENITIS EXCISION Left 01/13/2014   Procedure: WIDE EXCISION HIDRADENITIS LEFT AXILLA;  Surgeon: [name], MD;  Location: [place];  Service: General;  Laterality: Left;  . INCISION AND DRAINAGE ABSCESS Right 03/17/2019   Procedure: INCISION AND DRAINAGE RIGHT THIGH ABSCESS;  Surgeon: [name], MD;  Location: [place];  Service: General;  Laterality: Right;  . IRRIGATION AND DEBRIDEMENT ABSCESS Right 06/02/2014   Procedure: IRRIGATION AND DEBRIDEMENT ABSCESS;  Surgeon: [name], MD;  Location: [place];  Service: General;  Laterality: Right;  . IRRIGATION AND DEBRIDEMENT ABSCESS Left 09/23/2014   Procedure: IRRIGATION AND DEBRIDEMENT ABSCESS/LEFT THIGH;   Surgeon: [name], MD;  Location: [place];  Service: General;  Laterality: Left;  . LEFT HEART CATHETERIZATION WITH CORONARY ANGIOGRAM N/A 07/14/2014   Procedure: LEFT HEART CATHETERIZATION WITH CORONARY ANGIOGRAM;  Surgeon: [name], MD;  Location: [facility] CATH LAB;  Service: Cardiovascular;  Laterality: N/A;  . TONSILLECTOMY    . WISDOM TOOTH EXTRACTION      There were no vitals filed for this visit.   Subjective Assessment - 06/27/20 1534    Currently in Pain? No/denies                                OT Education - 06/27/20 1534    Education Details Educated on time Personnel officer) Educated Patient    Methods Explanation;Handout    Comprehension Verbal cues required            OT Short Term Goals - 06/26/20 1220      OT SHORT TERM GOAL #1   Status On-going      OT SHORT TERM GOAL #2   Status On-going      OT SHORT TERM GOAL #3   Status On-going      OT SHORT TERM GOAL #4   Status On-going         Group Session:  S: None noted  O: Group began with a recap on strategies/tips learned from previous OT session focused on time management. Today's group continued to focus on topic of time management and reviewed additional strategies to manage and create schedules that are more efficient and effective in meeting their needs.  A: [name] was initially present in group session, however was minimally engaged in group discussion. Pt did not respond to this writer's verbal cues to engage and prompt patient engagement. Pt logged off session early at 12:35 PM and did not return. SW to follow up with patient post group.   P: Plan to re-assess patient's willingness to engage in [facility] programming and discuss with treatment team    Plan - 06/27/20 1535    Occupational performance deficits (Please refer to evaluation for details): ADL's;IADL's;Rest and Sleep;Education;Work;Leisure;Social Participation    Body Structure /  Function / Physical Skills ADL    Cognitive Skills Attention;Consciousness;Emotional;Energy/Drive;Memory;Orientation;Perception;Problem Solve;Safety Awareness;Thought;Understand    Psychosocial Skills Coping Strategies;Environmental  Adaptations;Habits;Interpersonal Interaction;Routines and Behaviors           Patient will benefit from skilled therapeutic intervention in order to improve the following deficits and impairments:   Body Structure / Function / Physical Skills: ADL Cognitive Skills: Attention, Consciousness, Emotional, Energy/Drive, Memory, Orientation, Perception, Problem Solve, Safety Awareness, Thought, Understand Psychosocial Skills: Coping Strategies, Environmental  Adaptations, Habits, Interpersonal Interaction, Routines and Behaviors   Visit Diagnosis: Difficulty coping  Frontal lobe and  executive function deficit  MDD (major depressive disorder), recurrent severe, without psychosis ([place])    Problem List Patient Active Problem List   Diagnosis Date Noted  . Chronic posttraumatic stress disorder 04/21/2019  . DKA (diabetic ketoacidosis) ([place]) 03/15/2019  . Abscess of right thigh 03/15/2019  . Bipolar 2 disorder, major depressive episode ([place]) 01/04/2019  . Panic disorder 01/04/2019  . Enteritis 12/24/2017  . Intractable nausea and vomiting 12/24/2017  . Epigastric pain   . Other chest pain 09/28/2017  . TIA (transient ischemic attack) 09/28/2017  . Paroxysmal SVT (supraventricular tachycardia) ([place]) 06/24/2017  . Dizziness 06/24/2017  . Weakness 06/24/2017  . Hyperglycemia 03/17/2016  . Abscess 03/17/2016  . Chest discomfort 07/10/2014  . Cellulitis 06/01/2014  . Cellulitis of right thigh 06/01/2014  . Postop check 01/19/2014  . Smoking addiction 08/29/2013  . DKA, type 1 ([place]) 05/09/2013  . Hidradenitis suppurativa 04/21/2012  . Esophageal reflux 11/17/2011  . Seasonal and perennial allergic rhinitis 05/20/2010  . BRONCHITIS, ACUTE 11/29/2007  .  HYPONATREMIA 11/14/2007  . GAD (generalized anxiety disorder) 10/24/2007  . Sinus tachycardia 07/07/2007  . Diabetes mellitus type I ([place]) 04/08/2007  . Smoker 04/08/2007  . 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 MIGRAINES, HX OF 04/08/2007    06/27/2020  [name], MOT, OTR/L  06/27/2020, 3:36 PM  [place], [place], [address] Phone: [phone]   Fax:  [phone]  Name: [name] MRN: [id number] Date of Birth: 1981/02/02

## 2020-06-28 ENCOUNTER — Other Ambulatory Visit (HOSPITAL_COMMUNITY): Payer: 59 | Admitting: Licensed Clinical Social Worker

## 2020-06-28 ENCOUNTER — Other Ambulatory Visit (HOSPITAL_COMMUNITY): Payer: 59

## 2020-06-28 ENCOUNTER — Other Ambulatory Visit (HOSPITAL_COMMUNITY): Payer: 59 | Admitting: Occupational Therapy

## 2020-06-28 ENCOUNTER — Ambulatory Visit (HOSPITAL_COMMUNITY): Payer: 59

## 2020-06-28 ENCOUNTER — Encounter (HOSPITAL_COMMUNITY): Payer: Self-pay

## 2020-06-28 ENCOUNTER — Other Ambulatory Visit: Payer: Self-pay

## 2020-06-28 DIAGNOSIS — F32A Depression, unspecified: Secondary | ICD-10-CM | POA: Diagnosis not present

## 2020-06-28 DIAGNOSIS — F332 Major depressive disorder, recurrent severe without psychotic features: Secondary | ICD-10-CM

## 2020-06-28 DIAGNOSIS — F3181 Bipolar II disorder: Secondary | ICD-10-CM

## 2020-06-28 DIAGNOSIS — R41844 Frontal lobe and executive function deficit: Secondary | ICD-10-CM

## 2020-06-28 DIAGNOSIS — R4589 Other symptoms and signs involving emotional state: Secondary | ICD-10-CM

## 2020-06-28 NOTE — Therapy (Signed)
[place], [place], [address] Phone: [phone]   Fax:  [phone] Virtual Visit via Video Note  I connected with [name] on 06/28/20 at  11:00 AM EDT by a video enabled telemedicine application and verified that I am speaking with the correct person using two identifiers.   I discussed the limitations of evaluation and management by telemedicine and the availability of in person appointments. The patient expressed understanding and agreed to proceed.     I discussed the assessment and treatment plan with the patient. The patient was provided an opportunity to ask questions and all were answered. The patient agreed with the plan and demonstrated an understanding of the instructions.   The patient was advised to call back or seek an in-person evaluation if the symptoms worsen or if the condition fails to improve as anticipated.  Location: Patient - Patient [place]   I provided 60 minutes of non-face-to-face time during this encounter.   [name], OT   Occupational Therapy Treatment  Patient Details  Name: [name] MRN: [id number] Date of Birth: 20-Mar-1981 Referring Provider (OT): [name]   Encounter Date: 06/28/2020   OT End of Session - 06/28/20 1205    Visit Number 4    Number of Visits 20    Date for OT Re-Evaluation 07/19/20    Authorization Type [employer]    OT Start Time 1100    OT Stop Time 1200    OT Time Calculation (min) 60 min    Activity Tolerance Other (comment)   Pt reports having an anxiety attack during virtual session and had to step away   Behavior During Therapy --   Pt reports having an anxiety attack (unrelated to group) and stepped away for majority of session          Past Medical History:  Diagnosis Date  . Abscess of left axilla - PREVOTELLA BIVIA & Staph Coag Neg 07/12/2012   MODERATE PREVOTELLA BIVIA  Note: BETA LACTAMASE NEGATIVE    . Asthma    as a child  . Cancer ([place])    skin tag rt breast  . Cellulitis 06/01/2014   RT INNER THIGH  . Depression    hx  . Diabetes ([place])   . Diabetes mellitus    IDDM, Insulin pump; followed by Dr. [name]  . GERD (gastroesophageal reflux disease)    no current meds.  . Heart murmur   . Hidradenitis 04/2012   bilat. thighs, left groin - open areas on thighs  . Hx MRSA infection   . Hydradenitis   . PCOS (polycystic ovarian syndrome)   . Pneumonia    hx  . SVT (supraventricular tachycardia) ([place]) 06/2017  . Vitamin D insufficiency     Past Surgical History:  Procedure Laterality Date  . BREAST EXCISIONAL BIOPSY Right   . BREAST SURGERY     right lumpectomy  . BUNIONECTOMY     left  . CARDIAC CATHETERIZATION    . CHOLECYSTECTOMY  12/02/2006   lap. chole.  Marland Kitchen DILATION AND EVACUATION  02/14/2007  . ESOPHAGOGASTRODUODENOSCOPY  11/17/2011   Procedure: ESOPHAGOGASTRODUODENOSCOPY (EGD);  Surgeon: [name], MD;  [facility];  Service: Endoscopy;  Laterality: N/A;  . EYE SURGERY     exc. stye left eye  . HYDRADENITIS EXCISION  04/30/2012   Procedure: EXCISION HYDRADENITIS GROIN;  Surgeon: [name], MD;  Location: [facility];  Service: General;  Laterality: Left;  wide excision hidradenitis bilateral thighs and Left groin  . HYDRADENITIS EXCISION Left 01/13/2014   Procedure: WIDE EXCISION HIDRADENITIS LEFT AXILLA;  Surgeon: [name], MD;  Location: [place];  Service: General;  Laterality: Left;  . INCISION AND DRAINAGE ABSCESS Right 03/17/2019   Procedure: INCISION AND DRAINAGE RIGHT THIGH ABSCESS;  Surgeon: [name], MD;  Location: [facility];  Service: General;  Laterality: Right;  . IRRIGATION AND DEBRIDEMENT ABSCESS Right 06/02/2014   Procedure: IRRIGATION AND DEBRIDEMENT ABSCESS;  Surgeon: [name], MD;  Location: [facility];  Service: General;  Laterality: Right;  . IRRIGATION AND  DEBRIDEMENT ABSCESS Left 09/23/2014   Procedure: IRRIGATION AND DEBRIDEMENT ABSCESS/LEFT THIGH;  Surgeon: [name], MD;  Location: [facility];  Service: General;  Laterality: Left;  . LEFT HEART CATHETERIZATION WITH CORONARY ANGIOGRAM N/A 07/14/2014   Procedure: LEFT HEART CATHETERIZATION WITH CORONARY ANGIOGRAM;  Surgeon: [name], MD;  Location: [facility] CATH LAB;  Service: Cardiovascular;  Laterality: N/A;  . TONSILLECTOMY    . WISDOM TOOTH EXTRACTION      There were no vitals filed for this visit.   Subjective Assessment - 06/28/20 1205    Currently in Pain? No/denies                                OT Education - 06/28/20 1205    Education Details Educated on strategies/tips to overcome procrastination and reviewed goal-setting    Person(s) Educated Patient    Methods Explanation;Handout    Comprehension Verbal cues required            OT Short Term Goals - 06/26/20 1220      OT SHORT TERM GOAL #1   Status On-going      OT SHORT TERM GOAL #2   Status On-going      OT SHORT TERM GOAL #3   Status On-going      OT SHORT TERM GOAL #4   Status On-going         Group Session:  S: "I was having a little bit of an anxiety attack and I had to step away for a moment."  O: Today's group session encouraged engagement and participation through discussion focused on procrastination. Group reviewed common themes/phrases we often say to ourselves to validate our procrastination and looked at strategies to combat this procrastination and lack of energy/motivation. Following discussion on procrastination, group members were introduced to goal-setting and identified specific goals or areas of improvement around the topic of goal-setting that they would like to address in tomorrow's session.    A: [place] demonstrated minimal engagement in today's OT session; pt signed on to group on time, however did not verbally engage in discussion with this Probation officer or peers. When  prompted to respond, pt did not initially engage, however at close of session reported that she needed to step away from group due to having an anxiety attack. Pt reported feeling better after going outside to her front yard and denied any needs at this time.    P: Continue to attend PHP OT group sessions 5x week for 1 weeks to promote daily structure, social engagement, and opportunities to develop and utilize adaptive strategies to maximize functional performance in preparation for safe transition and integration back into school, work, and the community. Plan to address topic of SMART goals in next OT group session.  Plan - 06/28/20 1209    Occupational performance deficits (Please refer to evaluation for details): ADL's;IADL's;Rest and Sleep;Education;Work;Leisure;Social Participation    Body Structure / Function / Physical Skills ADL    Cognitive Skills Attention;Consciousness;Emotional;Energy/Drive;Memory;Orientation;Perception;Problem Solve;Safety Awareness;Thought;Understand    Psychosocial Skills Coping Strategies;Environmental  Adaptations;Habits;Interpersonal Interaction;Routines and Behaviors           Patient will benefit from skilled therapeutic intervention in order to improve the following deficits and impairments:   Body Structure / Function / Physical Skills: ADL Cognitive Skills: Attention, Consciousness, Emotional, Energy/Drive, Memory, Orientation, Perception, Problem Solve, Safety Awareness, Thought, Understand Psychosocial Skills: Coping Strategies, Environmental  Adaptations, Habits, Interpersonal Interaction, Routines and Behaviors   Visit Diagnosis: Difficulty coping  Frontal lobe and executive function deficit  Severe episode of recurrent major depressive disorder, without psychotic features ([place])    Problem List Patient Active Problem List   Diagnosis Date Noted  . Chronic posttraumatic stress disorder 04/21/2019  . DKA (diabetic ketoacidosis) ([place])  03/15/2019  . Abscess of right thigh 03/15/2019  . Bipolar 2 disorder, major depressive episode ([place]) 01/04/2019  . Panic disorder 01/04/2019  . Enteritis 12/24/2017  . Intractable nausea and vomiting 12/24/2017  . Epigastric pain   . Other chest pain 09/28/2017  . TIA (transient ischemic attack) 09/28/2017  . Paroxysmal SVT (supraventricular tachycardia) ([place]) 06/24/2017  . Dizziness 06/24/2017  . Weakness 06/24/2017  . Hyperglycemia 03/17/2016  . Abscess 03/17/2016  . Chest discomfort 07/10/2014  . Cellulitis 06/01/2014  . Cellulitis of right thigh 06/01/2014  . Postop check 01/19/2014  . Smoking addiction 08/29/2013  . DKA, type 1 ([place]) 05/09/2013  . Hidradenitis suppurativa 04/21/2012  . Esophageal reflux 11/17/2011  . Seasonal and perennial allergic rhinitis 05/20/2010  . BRONCHITIS, ACUTE 11/29/2007  . HYPONATREMIA 11/14/2007  . GAD (generalized anxiety disorder) 10/24/2007  . Sinus tachycardia 07/07/2007  . Diabetes mellitus type I ([place]) 04/08/2007  . Smoker 04/08/2007  . 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 MIGRAINES, HX OF 04/08/2007    06/28/2020  [name], MOT, OTR/L 06/28/2020, 12:10 PM  [facility] HOSPITALIZATION PROGRAM [place], [place], [address] Phone: [phone]   Fax:  [phone]  Name: NAIYANA BARBIAN MRN: [id number] Date of Birth: [birth year]
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
## 2020-06-29 ENCOUNTER — Other Ambulatory Visit (HOSPITAL_COMMUNITY): Payer: 59

## 2020-06-29 ENCOUNTER — Encounter (HOSPITAL_COMMUNITY): Payer: Self-pay

## 2020-06-29 ENCOUNTER — Other Ambulatory Visit (HOSPITAL_COMMUNITY): Payer: 59 | Admitting: Licensed Clinical Social Worker

## 2020-06-29 ENCOUNTER — Ambulatory Visit (HOSPITAL_COMMUNITY): Payer: 59

## 2020-06-29 ENCOUNTER — Other Ambulatory Visit (HOSPITAL_COMMUNITY): Payer: 59 | Admitting: Occupational Therapy

## 2020-06-29 ENCOUNTER — Other Ambulatory Visit: Payer: Self-pay

## 2020-06-29 DIAGNOSIS — F32A Depression, unspecified: Secondary | ICD-10-CM | POA: Diagnosis not present

## 2020-06-29 DIAGNOSIS — F332 Major depressive disorder, recurrent severe without psychotic features: Secondary | ICD-10-CM

## 2020-06-29 DIAGNOSIS — R4589 Other symptoms and signs involving emotional state: Secondary | ICD-10-CM

## 2020-06-29 DIAGNOSIS — R41844 Frontal lobe and executive function deficit: Secondary | ICD-10-CM

## 2020-06-29 DIAGNOSIS — F3181 Bipolar II disorder: Secondary | ICD-10-CM

## 2020-06-29 NOTE — Therapy (Addendum)
[facility] [place], [place], [address] Phone: [phone]   Fax:  [phone] Virtual Visit via Video Note  I connected with [name] on 06/29/20 at  11:00 AM EDT by a video enabled telemedicine application and verified that I am speaking with the correct person using two identifiers.   I discussed the limitations of evaluation and management by telemedicine and the availability of in person appointments. The patient expressed understanding and agreed to proceed.   I discussed the assessment and treatment plan with the patient. The patient was provided an opportunity to ask questions and all were answered. The patient agreed with the plan and demonstrated an understanding of the instructions.   The patient was advised to call back or seek an in-person evaluation if the symptoms worsen or if the condition fails to improve as anticipated.  Location: Patient - Patient [place]   I provided 60 minutes of non-face-to-face time during this encounter.   [name], OT   Occupational Therapy Treatment  Patient Details  Name: LELLA MULLANY MRN: [id number] Date of Birth: [birth year] Referring Provider (OT): [name]   Encounter Date: 06/29/2020   OT End of Session - 06/29/20 1427    Visit Number 5    Number of Visits 20    Date for OT Re-Evaluation 07/19/20    Authorization Type [employer]    OT Start Time 1110    OT Stop Time 1210    OT Time Calculation (min) 60 min    Activity Tolerance Other (comment)   Pt attended group 11:10 to 11:25   Behavior During Therapy Flat affect           Past Medical History:  Diagnosis Date  . Abscess of left axilla - PREVOTELLA BIVIA & Staph Coag Neg 07/12/2012   MODERATE PREVOTELLA BIVIA Note: BETA LACTAMASE NEGATIVE    . Asthma    as a child  . Cancer ([place])    skin tag rt breast  . Cellulitis 06/01/2014   RT INNER THIGH  .  Depression    hx  . Diabetes ([place])   . Diabetes mellitus    IDDM, Insulin pump; followed by Dr. [name]  . GERD (gastroesophageal reflux disease)    no current meds.  . Heart murmur   . Hidradenitis 04/2012   bilat. thighs, left groin - open areas on thighs  . Hx MRSA infection   . Hydradenitis   . PCOS (polycystic ovarian syndrome)   . Pneumonia    hx  . SVT (supraventricular tachycardia) ([place]) 06/2017  . Vitamin D insufficiency     Past Surgical History:  Procedure Laterality Date  . BREAST EXCISIONAL BIOPSY Right   . BREAST SURGERY     right lumpectomy  . BUNIONECTOMY     left  . CARDIAC CATHETERIZATION    . CHOLECYSTECTOMY  12/02/2006   lap. chole.  Marland Kitchen DILATION AND EVACUATION  02/14/2007  . ESOPHAGOGASTRODUODENOSCOPY  11/17/2011   Procedure: ESOPHAGOGASTRODUODENOSCOPY (EGD);  Surgeon: [name], MD;  [facility];  Service: Endoscopy;  Laterality: N/A;  . EYE SURGERY     exc. stye left eye  . HYDRADENITIS EXCISION  04/30/2012   Procedure: EXCISION HYDRADENITIS GROIN;  Surgeon: [name], MD;  Location: [place];  Service: General;  Laterality: Left;  wide excision hidradenitis bilateral thighs and Left groin  . HYDRADENITIS EXCISION Left 01/13/2014  Procedure: WIDE EXCISION HIDRADENITIS LEFT AXILLA;  Surgeon: [name], MD;  Location: [place];  Service: General;  Laterality: Left;  . INCISION AND DRAINAGE ABSCESS Right 03/17/2019   Procedure: INCISION AND DRAINAGE RIGHT THIGH ABSCESS;  Surgeon: [name], MD;  Location: [place];  Service: General;  Laterality: Right;  . IRRIGATION AND DEBRIDEMENT ABSCESS Right 06/02/2014   Procedure: IRRIGATION AND DEBRIDEMENT ABSCESS;  Surgeon: [name], MD;  Location: [place];  Service: General;  Laterality: Right;  . IRRIGATION AND DEBRIDEMENT ABSCESS Left 09/23/2014   Procedure: IRRIGATION AND DEBRIDEMENT ABSCESS/LEFT THIGH;  Surgeon: [name], MD;  Location: [place];   Service: General;  Laterality: Left;  . LEFT HEART CATHETERIZATION WITH CORONARY ANGIOGRAM N/A 07/14/2014   Procedure: LEFT HEART CATHETERIZATION WITH CORONARY ANGIOGRAM;  Surgeon: [name], MD;  Location: [facility] CATH LAB;  Service: Cardiovascular;  Laterality: N/A;  . TONSILLECTOMY    . WISDOM TOOTH EXTRACTION      There were no vitals filed for this visit.   Subjective Assessment - 06/29/20 1425    Currently in Pain? No/denies                                OT Education - 06/29/20 1425    Education Details Educated on goal-setting strategies and goal-setting with use of the SMART goal framework    Person(s) Educated Patient    Methods Explanation;Handout    Comprehension Verbal cues required            OT Short Term Goals - 06/26/20 1220      OT SHORT TERM GOAL #1   Status On-going      OT SHORT TERM GOAL #2   Status On-going      OT SHORT TERM GOAL #3   Status On-going      OT SHORT TERM GOAL #4   Status On-going         Group Session:  S: None noted  O: Today's group session encouraged engagement and participation through discussion focused on goal-setting. Group members were introduced to goal-setting using the SMART Goal framework, identifying goals as Specific, Measureable, Acheivable, Relevant, and Time-Bound. Group members took time from group to create their own personal goal reflecting the SMART goal template and shared for review by peers and OT.    A: Lakhia was present in OT session from 11:10 until 11:25, at which time, she signed off of group session without notifying this Probation officer. Pt continues to demonstrate minimal engagement in sessions while present and did not return. Pt did not have her camera on for the duration of group present. Pt continues to demonstrate lack of engagement in OT sessions throughout attendance of PHP programming and will be addressed with treatment team.    P: Continue to attend PHP OT group sessions 5x  week for 2 weeks to promote daily structure, social engagement, and opportunities to develop and utilize adaptive strategies to maximize functional performance in preparation for safe transition and integration back into school, work, and the community.   OCCUPATIONAL THERAPY DISCHARGE SUMMARY  Visits from Start of Care: 5  Current functional level related to goals / functional outcomes: Patient has not met any of her OT goals within the [facility] program. Pt is being discharged at this time due to lack of progress and limited engagement in OT sessions. Plan to follow up with PCP and individual therapist at discharge .  Remaining deficits: See above   Education / Equipment: See above   Plan: Patient agrees to discharge.  Patient goals were not met. Patient is being discharged due to lack of progress.  ?????        Plan - 06/29/20 1428    Occupational performance deficits (Please refer to evaluation for details): ADL's;IADL's;Rest and Sleep;Education;Work;Leisure;Social Participation    Body Structure / Function / Physical Skills ADL    Cognitive Skills Attention;Consciousness;Emotional;Energy/Drive;Memory;Orientation;Perception;Problem Solve;Safety Awareness;Thought;Understand    Psychosocial Skills Coping Strategies;Environmental  Adaptations;Habits;Interpersonal Interaction;Routines and Behaviors           Patient will benefit from skilled therapeutic intervention in order to improve the following deficits and impairments:   Body Structure / Function / Physical Skills: ADL Cognitive Skills: Attention, Consciousness, Emotional, Energy/Drive, Memory, Orientation, Perception, Problem Solve, Safety Awareness, Thought, Understand Psychosocial Skills: Coping Strategies, Environmental  Adaptations, Habits, Interpersonal Interaction, Routines and Behaviors   Visit Diagnosis: Difficulty coping  Frontal lobe and executive function deficit  Severe episode of recurrent major depressive  disorder, without psychotic features ([place])    Problem List Patient Active Problem List   Diagnosis Date Noted  . Chronic posttraumatic stress disorder 04/21/2019  . DKA (diabetic ketoacidosis) ([place]) 03/15/2019  . Abscess of right thigh 03/15/2019  . Bipolar 2 disorder, major depressive episode ([place]) 01/04/2019  . Panic disorder 01/04/2019  . Enteritis 12/24/2017  . Intractable nausea and vomiting 12/24/2017  . Epigastric pain   . Other chest pain 09/28/2017  . TIA (transient ischemic attack) 09/28/2017  . Paroxysmal SVT (supraventricular tachycardia) ([place]) 06/24/2017  . Dizziness 06/24/2017  . Weakness 06/24/2017  . Hyperglycemia 03/17/2016  . Abscess 03/17/2016  . Chest discomfort 07/10/2014  . Cellulitis 06/01/2014  . Cellulitis of right thigh 06/01/2014  . Postop check 01/19/2014  . Smoking addiction 08/29/2013  . DKA, type 1 ([place]) 05/09/2013  . Hidradenitis suppurativa 04/21/2012  . Esophageal reflux 11/17/2011  . Seasonal and perennial allergic rhinitis 05/20/2010  . BRONCHITIS, ACUTE 11/29/2007  . HYPONATREMIA 11/14/2007  . GAD (generalized anxiety disorder) 10/24/2007  . Sinus tachycardia 07/07/2007  . Diabetes mellitus type I ([place]) 04/08/2007  . Smoker 04/08/2007  . 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 MIGRAINES, HX OF 04/08/2007    06/29/2020  [name], MOT, OTR/L  06/29/2020, 2:29 PM  [facility] HOSPITALIZATION PROGRAM [place], [place], [address] Phone: [phone]   Fax:  [phone]  Name: AVA TANGNEY MRN: [id number] Date of Birth: 1981-04-19

## 2020-07-02 ENCOUNTER — Other Ambulatory Visit (HOSPITAL_COMMUNITY): Payer: 59

## 2020-07-02 ENCOUNTER — Other Ambulatory Visit (HOSPITAL_COMMUNITY): Payer: 59 | Admitting: Occupational Therapy

## 2020-07-02 ENCOUNTER — Ambulatory Visit (HOSPITAL_COMMUNITY): Payer: 59

## 2020-07-02 ENCOUNTER — Other Ambulatory Visit (HOSPITAL_COMMUNITY): Payer: 59 | Admitting: Licensed Clinical Social Worker

## 2020-07-02 ENCOUNTER — Other Ambulatory Visit: Payer: Self-pay

## 2020-07-02 DIAGNOSIS — F32A Depression, unspecified: Secondary | ICD-10-CM | POA: Diagnosis not present

## 2020-07-02 DIAGNOSIS — R41844 Frontal lobe and executive function deficit: Secondary | ICD-10-CM

## 2020-07-02 DIAGNOSIS — F332 Major depressive disorder, recurrent severe without psychotic features: Secondary | ICD-10-CM

## 2020-07-02 DIAGNOSIS — F3181 Bipolar II disorder: Secondary | ICD-10-CM

## 2020-07-02 DIAGNOSIS — R4589 Other symptoms and signs involving emotional state: Secondary | ICD-10-CM

## 2020-07-03 ENCOUNTER — Encounter (HOSPITAL_COMMUNITY): Payer: Self-pay

## 2020-07-03 ENCOUNTER — Ambulatory Visit (HOSPITAL_COMMUNITY): Payer: 59

## 2020-07-03 ENCOUNTER — Encounter (HOSPITAL_COMMUNITY): Payer: Self-pay | Admitting: Family

## 2020-07-03 ENCOUNTER — Other Ambulatory Visit (HOSPITAL_COMMUNITY): Payer: 59

## 2020-07-03 ENCOUNTER — Other Ambulatory Visit: Payer: Self-pay
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
## 2020-07-03 NOTE — Therapy (Deleted)
[facility] [place], [place], [address] Phone: [phone]   Fax:  [phone]  Occupational Therapy Treatment  Patient Details  Name: [name] MRN: [id number] Date of Birth: 05-04-1981 Referring Provider (OT): [name]   Encounter Date: 07/02/2020    Past Medical History:  Diagnosis Date  . Abscess of left axilla - PREVOTELLA BIVIA & Staph Coag Neg 07/12/2012   MODERATE PREVOTELLA BIVIA Note: BETA LACTAMASE NEGATIVE    . Asthma    as a child  . Cancer ([place])    skin tag rt breast  . Cellulitis 06/01/2014   RT INNER THIGH  . Depression    hx  . Diabetes ([place])   . Diabetes mellitus    IDDM, Insulin pump; followed by Dr. [name]  . GERD (gastroesophageal reflux disease)    no current meds.  . Heart murmur   . Hidradenitis 04/2012   bilat. thighs, left groin - open areas on thighs  . Hx MRSA infection   . Hydradenitis   . PCOS (polycystic ovarian syndrome)   . Pneumonia    hx  . SVT (supraventricular tachycardia) ([place]) 06/2017  . Vitamin D insufficiency     Past Surgical History:  Procedure Laterality Date  . BREAST EXCISIONAL BIOPSY Right   . BREAST SURGERY     right lumpectomy  . BUNIONECTOMY     left  . CARDIAC CATHETERIZATION    . CHOLECYSTECTOMY  12/02/2006   lap. chole.  Marland Kitchen DILATION AND EVACUATION  02/14/2007  . ESOPHAGOGASTRODUODENOSCOPY  11/17/2011   Procedure: ESOPHAGOGASTRODUODENOSCOPY (EGD);  Surgeon: [name], MD;  Location[facility];  Service: Endoscopy;  Laterality: N/A;  . EYE SURGERY     exc. stye left eye  . HYDRADENITIS EXCISION  04/30/2012   Procedure: EXCISION HYDRADENITIS GROIN;  Surgeon: [name], MD;  Location: [place];  Service: General;  Laterality: Left;  wide excision hidradenitis bilateral thighs and Left groin  . HYDRADENITIS EXCISION Left 01/13/2014   Procedure: WIDE EXCISION HIDRADENITIS LEFT AXILLA;   Surgeon: [name], MD;  Location: [place];  Service: General;  Laterality: Left;  . INCISION AND DRAINAGE ABSCESS Right 03/17/2019   Procedure: INCISION AND DRAINAGE RIGHT THIGH ABSCESS;  Surgeon: [name], MD;  Location: [place];  Service: General;  Laterality: Right;  . IRRIGATION AND DEBRIDEMENT ABSCESS Right 06/02/2014   Procedure: IRRIGATION AND DEBRIDEMENT ABSCESS;  Surgeon: [name], MD;  Location: [place];  Service: General;  Laterality: Right;  . IRRIGATION AND DEBRIDEMENT ABSCESS Left 09/23/2014   Procedure: IRRIGATION AND DEBRIDEMENT ABSCESS/LEFT THIGH;  Surgeon: [name], MD;  Location: [place];  Service: General;  Laterality: Left;  . LEFT HEART CATHETERIZATION WITH CORONARY ANGIOGRAM N/A 07/14/2014   Procedure: LEFT HEART CATHETERIZATION WITH CORONARY ANGIOGRAM;  Surgeon: [name], MD;  Location: [facility] CATH LAB;  Service: Cardiovascular;  Laterality: N/A;  . TONSILLECTOMY    . WISDOM TOOTH EXTRACTION      There were no vitals filed for this visit.                           OT Short Term Goals - 07/03/20 0958      OT SHORT TERM GOAL #1   Title Pt will actively engage in OT group sessions throughout duration of PHP programming, in order to promote daily structure, social engagement, and opportunities to develop and utilize  adaptive strategies to maximize functional performance in preparation for safe transition and integration back into school, work, and the community.    Status Not Met      OT SHORT TERM GOAL #2   Title Pt will identify and implement 1-3 sleep hygiene strategies she can utilize, in order to improve sleep quality/ADL performance, in preparation for safe and healthy reintegration back into the community at discharge.    Status Not Met      OT SHORT TERM GOAL #3   Title Pt will demonstrate an increase in assertive communication skills, as evidenced by, identifying at least 2 "I" statements when sharing their  feelings/needs, with min cues, in preparation for reintegration back into the community at discharge.    Status Not Met      OT SHORT TERM GOAL #4   Title Pt will practice and identify 1-3 adaptive coping strategies she can utilize, in order to safely manage increased depression/anxiety, with min cues, in preparation for safe and healthy reintegration back into the community at discharge.    Status Not Met           OCCUPATIONAL THERAPY DISCHARGE SUMMARY  Visits from Start of Care: 5  Current functional level related to goals / functional outcomes: Pt has demonstrated limited engagement in OT sessions within [place] programming and is being discharged today 07/03/20 due to lack of engagement and progress. Recommend patient follow up with PCP and individual therapist.    Remaining deficits: See above   Education / Equipment: See above   Plan: Patient agrees to discharge.  Patient goals were not met. Patient is being discharged due to lack of progress.  ?????                Patient will benefit from skilled therapeutic intervention in order to improve the following deficits and impairments:           Visit Diagnosis: Difficulty coping  Frontal lobe and executive function deficit  Severe episode of recurrent major depressive disorder, without psychotic features [facility])    Problem List Patient Active Problem List   Diagnosis Date Noted  . Chronic posttraumatic stress disorder 04/21/2019  . DKA (diabetic ketoacidosis) ([place]) 03/15/2019  . Abscess of right thigh 03/15/2019  . Bipolar 2 disorder, major depressive episode ([place]) 01/04/2019  . Panic disorder 01/04/2019  . Enteritis 12/24/2017  . Intractable nausea and vomiting 12/24/2017  . Epigastric pain   . Other chest pain 09/28/2017  . TIA (transient ischemic attack) 09/28/2017  . Paroxysmal SVT (supraventricular tachycardia) ([place]) 06/24/2017  . Dizziness 06/24/2017  . Weakness 06/24/2017  . Hyperglycemia  03/17/2016  . Abscess 03/17/2016  . Chest discomfort 07/10/2014  . Cellulitis 06/01/2014  . Cellulitis of right thigh 06/01/2014  . Postop check 01/19/2014  . Smoking addiction 08/29/2013  . DKA, type 1 ([place]) 05/09/2013  . Hidradenitis suppurativa 04/21/2012  . Esophageal reflux 11/17/2011  . Seasonal and perennial allergic rhinitis 05/20/2010  . BRONCHITIS, ACUTE 11/29/2007  . HYPONATREMIA 11/14/2007  . GAD (generalized anxiety disorder) 10/24/2007  . Sinus tachycardia 07/07/2007  . Diabetes mellitus type I ([place]) 04/08/2007  . Smoker 04/08/2007  . 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 MIGRAINES, HX OF 04/08/2007    07/03/2020  [name], MOT, OTR/L  07/03/2020, 10:00 AM  [facility] HOSPITALIZATION PROGRAM [place], [place], [address] Phone: [phone]   Fax:  [phone]  Name: SKYLEY GRANDMAISON MRN: [id number] Date of Birth: 09-26-1980

## 2020-07-04 ENCOUNTER — Ambulatory Visit (HOSPITAL_COMMUNITY): Payer: 59

## 2020-07-04 ENCOUNTER — Other Ambulatory Visit (HOSPITAL_COMMUNITY): Payer: 59

## 2020-07-05 ENCOUNTER — Ambulatory Visit (HOSPITAL_COMMUNITY): Payer: 59

## 2020-07-05 ENCOUNTER — Other Ambulatory Visit (HOSPITAL_COMMUNITY): Payer: 59

## 2020-07-06 ENCOUNTER — Other Ambulatory Visit (HOSPITAL_COMMUNITY): Payer: 59

## 2020-07-06 ENCOUNTER — Ambulatory Visit (HOSPITAL_COMMUNITY): Payer: 59

## 2020-07-07 NOTE — Progress Notes (Unsigned)
discontinued

## 2020-07-09 ENCOUNTER — Telehealth (HOSPITAL_COMMUNITY): Payer: Self-pay | Admitting: Licensed Clinical Social Worker

## 2020-07-09 NOTE — Telephone Encounter (Signed)
Cln reached out to pt after receiving a message from pt's HR representative, Ahmed Prima. HR requested FMLA paperwork be sent over.  Cln called pt to f/u before information sent was over. Cln informed pt that the paperwork is completed for her but we had not received the required ROI's. Pt states she faxed the ROI's on 10/14. Cln able to confirm they were received at our office.  Cln reached out to HR for a fax number at which to send the needed forms and updated pt on where we are in the process.

## 2020-07-10 NOTE — Psych (Signed)
Virtual Visit via Video Note  I connected with [name] on 06/22/20 at  9:00 AM EDT by a video enabled telemedicine application and verified that I am speaking with the correct person using two identifiers.  Location: Patient: Patient Home Provider: Clinical Home Office   I discussed the limitations of evaluation and management by telemedicine and the availability of in person appointments. The patient expressed understanding and agreed to proceed.  I discussed the assessment and treatment plan with the patient. The patient was provided an opportunity to ask questions and all were answered. The patient agreed with the plan and demonstrated an understanding of the instructions.   The patient was advised to call back or seek an in-person evaluation if the symptoms worsen or if the condition fails to improve as anticipated.  Pt was provided 55 minutes of non-face-to-face time during this encounter.   Lorin Glass, LCSW    [facility] PHP THERAPIST PROGRESS NOTE  MOLINA HOLLENBACK [id number]  Session Time: 9:00 - 10:00  Participation Level: Active  Behavioral Response: CasualAlertDepressed  Type of Therapy: Group Therapy  Treatment Goals addressed: Coping  Interventions: CBT, DBT, Supportive and Reframing  Summary: Clinician led check-in regarding current stressors and situation, and review of patient completed daily inventory. Clinician utilized active listening and empathetic response and validated patient emotions. Clinician facilitated processing group on pertinent issues.   Therapist Response: ELISABEL HANOVER is a 39 y.o. female who presents with depression symptoms.  Patient arrived within time allowed and reports that she is feeling "really good so far." Patient rates hermood at [facility] a scale of 1-10 with 10 being great. Pt reports she slept well and got 8 hours for the first time in a "long time." Pt states she took xanax to help her sleep. Pt states she was able to do 2  chores yesterday and then had a relaxing evening. Pt exhibits mood dependent thinking.  Pt abruptly left group approximately 9:55. Pt had been reporting technical issues prior. Cln attempted to follow up by phone and the call went straight to voicemail. Cln left message requesting a call back.      Suicidal/Homicidal: Nowithout intent/plan  Plan: Pt will continue in PHP while working to decrease depression symptoms, increase emotional regulation, and increase ability to manage symptoms in a healthy manner.   Diagnosis: Bipolar 2 disorder, major depressive episode ([place]) [F31.81]    1. Bipolar 2 disorder, major depressive episode ([place])       Lorin Glass, LCSW 07/10/2020

## 2020-07-17 NOTE — Psych (Signed)
Virtual Visit via Video Note  I connected with [name] on 06/26/20 at  9:00 AM EDT by a video enabled telemedicine application and verified that I am speaking with the correct person using two identifiers.  Location: Patient: Patient Home Provider: Clinical Home Office   I discussed the limitations of evaluation and management by telemedicine and the availability of in person appointments. The patient expressed understanding and agreed to proceed.  I discussed the assessment and treatment plan with the patient. The patient was provided an opportunity to ask questions and all were answered. The patient agreed with the plan and demonstrated an understanding of the instructions.   The patient was advised to call back or seek an in-person evaluation if the symptoms worsen or if the condition fails to improve as anticipated.  Pt was provided 240 minutes of non-face-to-face time during this encounter.   [name], [name]    [facility] PHP THERAPIST PROGRESS NOTE  [name]  Session Time: 9:00 - 10:00  Participation Level: Active  Behavioral Response: CasualAlertDepressed  Type of Therapy: Group Therapy  Treatment Goals addressed: Coping  Interventions: CBT, DBT, Supportive and Reframing  Summary: Clinician led check-in regarding current stressors and situation, and review of patient completed daily inventory. Clinician utilized active listening and empathetic response and validated patient emotions. Clinician facilitated processing group on pertinent issues.   Therapist Response: [name] is a 39 y.o. female who presents with depression symptoms.  Patient arrived within time allowed and reports that she is feeling "not good." Patient rates hermood at a1.5on a scale of 1-10 with 10 being great. Pt reports she is physically not feeling well and is "mentally overloaded." Pt states she missed group yesterday due to internet issues and needing to rest. Pt  reports her weekend was up/down and she struggled with trauma triggers. Pt struggles with applying skills. Pt able to process. Pt minimally engaged in discussion.     Session Time: 10:00 - 11:00   Participation Level: Active  Behavioral Response: CasualAlertDepressed  Type of Therapy: Group Therapy  Treatment Goals addressed: Coping  Interventions: CBT, DBT, Supportive and Reframing  Summary: Cln introduced DBT concept of radical acceptance. Cln discussed radical acceptance as a strategy to decrease distress and to manage situations outside of their control. Group volunteered struggles they are experiencing with control and cln helped group apply radical acceptance.    Therapist Response: Pt engaged in discussion and identifies she can apply radical acceptance to her mental health.       Session Time: 11:00- 12:00  Participation Level:Active  Behavioral Response:CasualAlertDepressed  Type of Therapy: Group Therapy, OT  Treatment Goals addressed: Coping  Interventions:Psychosocial skills training, Supportive  Summary:Occupational Therapy group  Therapist Response:Patient engaged in group. See OT note.      Session Time: 12:00 -1:00  Participation Level:Active  Behavioral Response:CasualAlertDepressed  Type of Therapy:Group therapy  Treatment Goals addressed: Coping  Interventions:CBT; Solution focused; Supportive; Reframing  Summary:12:00 - 12:50: Cln introduced DBT emotional regulation skills. Group discussed ways to apply opposite action, pleasant events, checking the facts, and PLEASE. Cln encouraged pt's to focus on keeping emotions stable not on being "happy."  12:50 -1:00 Clinician led check-out. Clinician assessed for immediate needs, medication compliance and efficacy, and safety concerns  Therapist Response:12:00 - 12:50: Pt engaged in discussion and reports focusing on applying pleasant event skill.   12:50 -  1:00: At check-out, patientrates hermood at [facility] a scale of 1-10 with 10 being great.Pt  reports afternoon plans of taking her daughter to the doctor. Pt demonstrates some progress as evidenced byparticipating in full session. Patient denies SI/HI/self-harm at the end of group.    Suicidal/Homicidal: Nowithout intent/plan  Plan: Pt will continue in PHP while working to decrease depression symptoms, increase emotional regulation, and increase ability to manage symptoms in a healthy manner.   Diagnosis: MDD (major depressive disorder), recurrent severe, without psychosis ([place]) [F33.2]    1. MDD (major depressive disorder), recurrent severe, without psychosis ([place])   2. Bipolar 2 disorder, major depressive episode ([place])       [name], [name] 07/17/2020

## 2020-07-18 ENCOUNTER — Telehealth: Payer: Self-pay | Admitting: Internal Medicine

## 2020-07-18 DIAGNOSIS — E1065 Type 1 diabetes mellitus with hyperglycemia: Secondary | ICD-10-CM

## 2020-07-18 MED ORDER — INSULIN GLARGINE 100 UNIT/ML ~~LOC~~ SOLN: SUBCUTANEOUS | 3 refills | Status: DC

## 2020-07-18 NOTE — Telephone Encounter (Signed)
Needs refill lantus insulin while her dr Elmo Putt Sent to [facility]

## 2020-07-18 NOTE — Telephone Encounter (Signed)
Asks refill insulin while her dr is Surveyor, quantity sent to [facility]

## 2020-07-19 NOTE — Psych (Signed)
Virtual Visit via Video Note  I connected with [name] on 06/27/20 at  9:00 AM EDT by a video enabled telemedicine application and verified that I am speaking with the correct person using two identifiers.  Location: Patient: Patient Home Provider: Clinical Home Office   I discussed the limitations of evaluation and management by telemedicine and the availability of in person appointments. The patient expressed understanding and agreed to proceed.  I discussed the assessment and treatment plan with the patient. The patient was provided an opportunity to ask questions and all were answered. The patient agreed with the plan and demonstrated an understanding of the instructions.   The patient was advised to call back or seek an in-person evaluation if the symptoms worsen or if the condition fails to improve as anticipated.  Pt was provided 240 minutes of non-face-to-face time during this encounter.   Lorin Glass, LCSW    [facility] PHP THERAPIST PROGRESS NOTE  [name] [id number]  Session Time: 9:00 - 10:00  Participation Level: Active  Behavioral Response: CasualAlertDepressed  Type of Therapy: Group Therapy  Treatment Goals addressed: Coping  Interventions: CBT, DBT, Supportive and Reframing  Summary: Clinician led check-in regarding current stressors and situation, and review of patient completed daily inventory. Clinician utilized active listening and empathetic response and validated patient emotions. Clinician facilitated processing group on pertinent issues.   Therapist Response: [name] is a 39 y.o. female who presents with depression symptoms.  Patient arrived within time allowed and reports that she is feeling "okay." Patient rates hermood at [facility] a scale of 1-10 with 10 being great. Pt reports she slept 3 hours last night. Pt reports struggling with agitation yesterday and difficulty managing the agitation. Pt struggles with willingness. Pt able  to process. Pt minimally engaged in discussion.     Session Time: 10:00 - 11:00   Participation Level: Active  Behavioral Response: CasualAlertDepressed  Type of Therapy: Group Therapy  Treatment Goals addressed: Coping  Interventions: CBT, DBT, Supportive and Reframing  Summary: Cln led discussion on positive self-esteem. Group shared struggles they experience with self-esteem. Group highlights difficulty in accepting compliments. Cln encouraged pt's to use checking the facts as a way to challenge the negative thinking which accompanies receving compliments. Cln suggested pt's consider making a compliment file in which they document positive things people say to/about them to review when they are feeling poorly about themselves.   Therapist Response: Pt engaged in conversation and shares difficulty in accepting compliments. Pt is able to process and reports being open to starting a compliment file.       Session Time: 11:00- 12:00  Participation Level:Active  Behavioral Response:CasualAlertDepressed  Type of Therapy: Group therapy  Treatment Goals addressed: Coping  Interventions:Supportive, Reframing  Summary:Spiritual Care group  Therapist Response:Pt engaged in session. See chaplain note      Session Time: 12:00 -1:00  Participation Level:Active  Behavioral Response:CasualAlertDepressed  Type of Therapy:Group therapy  Treatment Goals addressed: Coping  Interventions:Psychosocial skills training, Supportive  Summary:12:00 - 12:50:Occupational Therapy group 12:50 -1:00 Clinician led check-out. Clinician assessed for immediate needs, medication compliance and efficacy, and safety concerns  Therapist Response:12:00 - 12:50:Patient engaged in group. See OT note. 12:50 - 1:00: Pt was not present for check-out    Suicidal/Homicidal: Nowithout intent/plan  Plan: Pt will continue in PHP while working to decrease  depression symptoms, increase emotional regulation, and increase ability to manage symptoms in a healthy manner.   Diagnosis: Bipolar 2 disorder, major  depressive episode ([place]) [F31.81]    1. Bipolar 2 disorder, major depressive episode ([place])       Lorin Glass, LCSW 07/19/2020

## 2020-07-21 NOTE — Psych (Signed)
Virtual Visit via Video Note  I connected with [name] on 06/29/20 at  9:00 AM EDT by a video enabled telemedicine application and verified that I am speaking with the correct person using two identifiers.  Location: Patient: Patient Home Provider: Clinical Home Office   I discussed the limitations of evaluation and management by telemedicine and the availability of in person appointments. The patient expressed understanding and agreed to proceed.  I discussed the assessment and treatment plan with the patient. The patient was provided an opportunity to ask questions and all were answered. The patient agreed with the plan and demonstrated an understanding of the instructions.   The patient was advised to call back or seek an in-person evaluation if the symptoms worsen or if the condition fails to improve as anticipated.  Pt was provided 130 minutes of non-face-to-face time during this encounter.   Lorin Glass, LCSW    [facility] PHP THERAPIST PROGRESS NOTE  JILLAINE WAREN [id number]  Session Time: 9:00 - 10:00  Participation Level: Active  Behavioral Response: CasualAlertDepressed  Type of Therapy: Group Therapy  Treatment Goals addressed: Coping  Interventions: CBT, DBT, Supportive and Reframing  Summary: Clinician led check-in regarding current stressors and situation, and review of patient completed daily inventory. Clinician utilized active listening and empathetic response and validated patient emotions. Clinician facilitated processing group on pertinent issues.   Therapist Response: ALEXANDER MCAULEY is a 39 y.o. female who presents with depression symptoms.  Patient arrived within time allowed and reports that she is feeling "okay." Patient rates hermood at [facility] a scale of 1-10 with 10 being great. Pt reports her back is feeling better today. Pt states she laughed yesterday which felt good. Pt shares that she believes she is pregnant. Pt able to process. Pt  engaged in discussion.     Session Time: 10:00 - 11:00   Participation Level: Active  Behavioral Response: CasualAlertDepressed  Type of Therapy: Group Therapy  Treatment Goals addressed: Coping  Interventions: CBT, DBT, Supportive and Reframing  Summary: Cln continued topic of positive self-esteem. Group members shared struggles with appearance and how that impacts their self-esteem. Cln encouraged pt's to consider what their body does for them as a way to reframe physical self-esteem.   Therapist Response: Pt engaged in discussion and shares struggles. Pt is able to state something her body does for her.      Pt left group without notice approximately 11:15. Cln attempted to follow up by phone and there was no answer.    Suicidal/Homicidal: Nowithout intent/plan  Plan: Pt will continue in PHP while working to decrease depression symptoms, increase emotional regulation, and increase ability to manage symptoms in a healthy manner.   Diagnosis: Bipolar 2 disorder, major depressive episode ([place]) [F31.81]    1. Bipolar 2 disorder, major depressive episode ([place])       Lorin Glass, LCSW 07/21/2020

## 2020-07-21 NOTE — Psych (Signed)
Virtual Visit via Video Note  I connected with [name] on 06/28/20 at  9:00 AM EDT by a video enabled telemedicine application and verified that I am speaking with the correct person using two identifiers.  Location: Patient: Patient Home Provider: Clinical Home Office   I discussed the limitations of evaluation and management by telemedicine and the availability of in person appointments. The patient expressed understanding and agreed to proceed.  I discussed the assessment and treatment plan with the patient. The patient was provided an opportunity to ask questions and all were answered. The patient agreed with the plan and demonstrated an understanding of the instructions.   The patient was advised to call back or seek an in-person evaluation if the symptoms worsen or if the condition fails to improve as anticipated.  Pt was provided 240 minutes of non-face-to-face time during this encounter.   Lorin Glass, LCSW    [facility] PHP THERAPIST PROGRESS NOTE  TRINITA DEVLIN [id number]  Session Time: 9:00 - 10:00  Participation Level: Active  Behavioral Response: CasualAlertDepressed  Type of Therapy: Group Therapy  Treatment Goals addressed: Coping  Interventions: CBT, DBT, Supportive and Reframing  Summary: Clinician led check-in regarding current stressors and situation, and review of patient completed daily inventory. Clinician utilized active listening and empathetic response and validated patient emotions. Clinician facilitated processing group on pertinent issues.   Therapist Response: ELANORA QUIN is a 39 y.o. female who presents with depression symptoms.  Patient arrived within time allowed and reports that she is feeling "okay." Patient rates hermood at [facility] a scale of 1-10 with 10 being great. Pt reports she is physically in pain today and struggling with her back. Pt states yesterday went well for her. Pt continues to struggle with sleep. Pt able to  process. Pt engaged in discussion.     Session Time: 10:00 - 11:00   Participation Level: Active  Behavioral Response: CasualAlertDepressed  Type of Therapy: Group Therapy  Treatment Goals addressed: Coping  Interventions: CBT, DBT, Supportive and Reframing  Summary:  Cln continued topic of DBT distress tolerance skills. Cln introduced STOP skill and group discussed ways they could apply the STOP skill in their every day life.    Therapist Response:  Pt engaged in discussion and reports she can apply STOP when ruminating.       Session Time: 11:00- 12:00  Participation Level:Active  Behavioral Response:CasualAlertDepressed  Type of Therapy: Group Therapy, OT  Treatment Goals addressed: Coping  Interventions:Psychosocial skills training, Supportive  Summary:Occupational Therapy group  Therapist Response:Patient engaged in group. See OT note.      Session Time: 12:00 -1:00  Participation Level:Active  Behavioral Response:CasualAlertDepressed  Type of Therapy:Group therapy  Treatment Goals addressed: Coping  Interventions:CBT; Solution focused; Supportive; Reframing  Summary:12:00 - 12:50: Cln continued topic of DBT distress tolerance skills. Cln introduced TIPP skills to use during cases of extreme emotion. Group practiced deep breathing and progressive muscle relaxation together. Group discussed which situations they can apply TIPP skills.  12:50 -1:00 Clinician led check-out. Clinician assessed for immediate needs, medication compliance and efficacy, and safety concerns  Therapist Response:12:00 - 12:50: Pt engaged in discussion reports most likely to utilize paced breathing.  12:50 - 1:00: At check-out, patientrates hermood at [facility] a scale of 1-10 with 10 being great.Pt reports afternoon plans of spending time with her daughter. Pt demonstrates some progress as evidenced byreporting decreased agitation. Patient  denies SI/HI/self-harm at the end of group.    Suicidal/Homicidal: Nowithout  intent/plan  Plan: Pt will continue in PHP while working to decrease depression symptoms, increase emotional regulation, and increase ability to manage symptoms in a healthy manner.   Diagnosis: Bipolar 2 disorder, major depressive episode ([employer]) [F31.81]    1. Bipolar 2 disorder, major depressive episode ([place])       Lorin Glass, LCSW 07/21/2020

## 2020-07-23 NOTE — Psych (Signed)
Virtual Visit via Video Note  I connected with [name] on 07/02/20 at  9:00 AM EDT by a video enabled telemedicine application and verified that I am speaking with the correct person using two identifiers.  Location: Patient: Patient Home Provider: Clinical Home Office   I discussed the limitations of evaluation and management by telemedicine and the availability of in person appointments. The patient expressed understanding and agreed to proceed.  I discussed the assessment and treatment plan with the patient. The patient was provided an opportunity to ask questions and all were answered. The patient agreed with the plan and demonstrated an understanding of the instructions.   The patient was advised to call back or seek an in-person evaluation if the symptoms worsen or if the condition fails to improve as anticipated.  Pt was provided 240 minutes of non-face-to-face time during this encounter.   Lorin Glass, LCSW    [facility] PHP THERAPIST PROGRESS NOTE  CHAU SAVELL [id number]  Session Time: 9:00 - 10:00  Participation Level: Active  Behavioral Response: CasualAlertDepressed  Type of Therapy: Group Therapy  Treatment Goals addressed: Coping  Interventions: CBT, DBT, Supportive and Reframing  Summary: Clinician led check-in regarding current stressors and situation, and review of patient completed daily inventory. Clinician utilized active listening and empathetic response and validated patient emotions. Clinician facilitated processing group on pertinent issues.   Therapist Response: LAIKYNN Fitzgerald is a 39 y.o. female who presents with depression symptoms.  Patient arrived within time allowed and reports that she is feeling "okay, but struggling." Patient rates hermood at [facility] a scale of 1-10 with 10 being great. Pt reports she left group on Friday because her power was shut-off. Pt states it has been rectified. Pt states she struggled with a migraine most of  the weekend and was "exhasuted." Pt able to process. Pt engaged in discussion.     Session Time: 10:00 - 11:00   Participation Level: Active  Behavioral Response: CasualAlertDepressed  Type of Therapy: Group Therapy  Treatment Goals addressed: Coping  Interventions: CBT, DBT, Supportive and Reframing  Summary:  Cln led discussion on CBT core beliefs. Cln provided context for core beliefs and how they impact our perception. Group members discussed possible core beliefs they hold and how it is affecting them.   Therapist Response:  Pt engaged in discussion and is able to identify core beliefs they experience.      Session Time: 11:00- 12:00  Participation Level:Minimal  Behavioral Response:CasualAlertDepressed  Type of Therapy: Group Therapy   Treatment Goals addressed: Coping  Interventions: CBT, DBT, Supportive and Reframing  Summary: Cln introduced topic of vulnerability. Group viewed TED talk "the power of vulnerability" to aid discussion. Group members shared the relationship they have with vulnerability and how it impacts their relationships.   Therapist Response: Pt minimally engaged in discussion and reports vulnerability is a struggle for her.       Session Time: 12:00 -1:00  Participation Level:Active  Behavioral Response:CasualAlertDepressed  Type of Therapy:Group therapy  Treatment Goals addressed: Coping  Interventions:CBT; Solution focused; Supportive; Reframing  Summary:12:00 - 12:50: Cln led discussion on safety and the ways in which we can seek and own it in ourselves. Group members shared how they view safety and situations which hinder safety. Cln encouraged pt's to think about emotional safety and ways to foster it.  12:50 -1:00 Clinician led check-out. Clinician assessed for immediate needs, medication compliance and efficacy, and safety concerns  Therapist Response:12:00 - 12:50: Pt minimally  engaged in  discussion.  12:50 - 1:00: At check-out, patientis non-responsive. Cln attempted to follow up by phone and there is no answer.    Suicidal/Homicidal: Nowithout intent/plan  Plan:  Added 10/12: Pt will be discharged due to sporadic attendance and engagement in group. Pt is discharged per T. Barbara Rumpf, NP. See note for more information.  Pt will follow up with her regular providers for therapy and psychiatry.   Diagnosis: Bipolar 2 disorder, major depressive episode ([place]) [F31.81]    1. Bipolar 2 disorder, major depressive episode ([place])   2. MDD (major depressive disorder), recurrent severe, without psychosis ([place])       Lorin Glass, LCSW 07/23/2020

## 2020-07-26 ENCOUNTER — Ambulatory Visit (INDEPENDENT_AMBULATORY_CARE_PROVIDER_SITE_OTHER): Payer: 59 | Admitting: Psychiatry

## 2020-07-26 ENCOUNTER — Encounter (HOSPITAL_COMMUNITY): Payer: Self-pay | Admitting: Psychiatry

## 2020-07-26 ENCOUNTER — Other Ambulatory Visit: Payer: Self-pay

## 2020-07-26 DIAGNOSIS — F3181 Bipolar II disorder: Secondary | ICD-10-CM
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
## 2020-08-06 ENCOUNTER — Encounter (HOSPITAL_COMMUNITY): Payer: Self-pay | Admitting: Psychiatry

## 2020-08-06 ENCOUNTER — Ambulatory Visit (INDEPENDENT_AMBULATORY_CARE_PROVIDER_SITE_OTHER): Payer: 59 | Admitting: Psychiatry

## 2020-08-06 ENCOUNTER — Other Ambulatory Visit: Payer: Self-pay

## 2020-08-06 DIAGNOSIS — F3181 Bipolar II disorder: Secondary | ICD-10-CM | POA: Diagnosis not present

## 2020-08-06 NOTE — Progress Notes (Addendum)
Virtual Visit via Video Note  I connected with [name] on 08/06/20 at  1:30 PM EST by a video enabled telemedicine application and verified that I am speaking with the correct person using two identifiers.  Location: Patient: Home Provider: Home Office   History of Present Illness: Bipolar 2 DO, Major Depressive type, PTSD  Treatment Plan Goals: 1)[name] would like to implement more self-care practices into life to reduce stress and anxiety. 2)[name] would like to process and applying grounding and mindfulness practices when experiencing trauma triggers and reminders.  Observations/Objective: Counselor met with Client for individual therapy via Webex. Counselor assessed MH symptoms and progress on treatment plan goals, with patient reportingthat she is "hanging in here and doing pretty good overall". Client presents withmoderatedepression and moderateanxiety. Client denies SI/HI or Self-Harm since last session and currently.   Goal 1: Counselor assessed application of spontaneous and routine self-care practices for Client. Client stated that at times incorporating self-care in her routine is challenging due to work, family needs and parenting responsibilities. However, Client named taking an adult time out in her car, journal time 2-3xs a week and communicating her needs/setting boundaries with others are the ways she applies self care. Counselor praised Client for efforts and positive outcomes. Counselor and Client worked to identify other ways self-care can be added to her life. Client stating she will be intentional this week in seeking smaller moments of self-care.  Goal 2: Counselor assessed progress towards goal of coping with trauma triggers through application of coping skills. Client reported a variety of trauma triggers related to childhood traumas, financial, relational and in repairing relationship with daughter. Client able to identify specific grounding and  mindfulness practices used to deescalate from trauma reminders. Client stated that coping strategies were helpful and additionally reported that she is teaching her daughter and her partner how to use skills as well. Counselor processed practical application tweaks to consider when triggered in the future.  Assessment and Plan: Counselor will continue to meet with patient to address treatment plan goals. Patient will continue to follow recommendations of providers and implement skills learned in session.  Follow Up Instructions: Counselor will send information for next session via Webex.   The patient was advised to call back or seek an in-person evaluation if the symptoms worsen or if the condition fails to improve as anticipated.  I provided 60 minutes of non-face-to-face time during this encounter.   Lise Auer, LCSW

## 2020-08-20 ENCOUNTER — Ambulatory Visit (HOSPITAL_COMMUNITY): Payer: 59 | Admitting: Psychiatry

## 2020-08-20 ENCOUNTER — Other Ambulatory Visit: Payer: Self-pay

## 2020-08-22 ENCOUNTER — Telehealth (INDEPENDENT_AMBULATORY_CARE_PROVIDER_SITE_OTHER): Payer: 59 | Admitting: Psychiatry

## 2020-08-22 ENCOUNTER — Telehealth (HOSPITAL_COMMUNITY): Payer: Self-pay | Admitting: *Deleted

## 2020-08-22 ENCOUNTER — Other Ambulatory Visit: Payer: Self-pay

## 2020-08-22 DIAGNOSIS — F4312 Post-traumatic stress disorder, chronic: Secondary | ICD-10-CM

## 2020-08-22 DIAGNOSIS — F411 Generalized anxiety disorder: Secondary | ICD-10-CM

## 2020-08-22 DIAGNOSIS — F3181 Bipolar II disorder: Secondary | ICD-10-CM

## 2020-08-22 DIAGNOSIS — F410 Panic disorder [episodic paroxysmal anxiety] without agoraphobia: Secondary | ICD-10-CM | POA: Diagnosis not present

## 2020-08-22 MED ORDER — ESZOPICLONE 3 MG PO TABS
3.0000 mg | ORAL_TABLET | Freq: Every evening | ORAL | 2 refills | Status: DC | PRN
Start: 2020-08-22 — End: 2020-11-27

## 2020-08-22 MED ORDER — CLONAZEPAM 0.5 MG PO TABS: 0.5000 mg | ORAL_TABLET | Freq: Three times a day (TID) | ORAL | 2 refills | Status: DC | PRN

## 2020-08-22 MED ORDER — PRAZOSIN HCL 2 MG PO CAPS: 2.0000 mg | ORAL_CAPSULE | Freq: Every day | ORAL | 0 refills | Status: DC

## 2020-08-22 MED ORDER — LAMOTRIGINE 200 MG PO TABS: 200.0000 mg | ORAL_TABLET | Freq: Every day | ORAL | 2 refills | Status: DC

## 2020-08-22 MED ORDER — VILAZODONE HCL 10 MG PO TABS: ORAL_TABLET | ORAL | 0 refills | Status: DC

## 2020-08-22 NOTE — Telephone Encounter (Signed)
Patient called and requested samples of Vibrid for 2 weeks to last till prior authorization is obtained for med.  Samples to be provided.  Returned call to patient to advise.
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
## 2020-08-22 NOTE — Telephone Encounter (Signed)
Thank you for helping with that.

## 2020-08-22 NOTE — Telephone Encounter (Signed)
You are welcome

## 2020-09-06 ENCOUNTER — Encounter (HOSPITAL_COMMUNITY): Payer: Self-pay | Admitting: Psychiatry

## 2020-09-06 ENCOUNTER — Ambulatory Visit (INDEPENDENT_AMBULATORY_CARE_PROVIDER_SITE_OTHER): Payer: 59 | Admitting: Psychiatry

## 2020-09-06 ENCOUNTER — Other Ambulatory Visit: Payer: Self-pay

## 2020-09-06 DIAGNOSIS — F4312 Post-traumatic stress disorder, chronic: Secondary | ICD-10-CM

## 2020-09-06 DIAGNOSIS — F3181 Bipolar II disorder: Secondary | ICD-10-CM | POA: Diagnosis not present

## 2020-09-06 NOTE — Progress Notes (Signed)
Virtual Visit via Video Note  I connected with [name] on 09/06/20 at  1:30 PM EST by a video enabled telemedicine application and verified that I am speaking with the correct person using two identifiers.  Location: Patient: Patient Home Provider: Home Office  I discussed the limitations of evaluation and management by telemedicine and the availability of in person appointments. The patient expressed understanding and agreed to proceed.  History of Present Illness: Bipolar 2 DO, Major Depressive type, PTSD  Treatment Plan Goals: 1)[name] would like to implement more self-care practices into life to reduce stress and anxiety. 2)[name] would like to process and applying grounding and mindfulness practices when experiencing trauma triggers and reminders.  Observations/Objective: Counselor met with Client for individual therapy via Webex. Counselor assessed MH symptoms and progress on treatment plan goals, with patient reportingthat she is maintaining better and balancing life more efficiently. 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 Client presents withmoderatedepression and moderateanxiety. Client denied suicidal ideation or self-harm behaviors.   Goal 1 and 2: Counselor used MI and CBT interventions to process thoughts and feelings related to the Client's named stressors. Counselor assessed daily functioning and application of skills, considering external stressors.Client named ability to more effectively and routinely incorporate her support system.  Counselor assessed application of mindfulness and grounding techniques used to combat depression. Client shared that her partner is doing a good job about prompting her to use grounding skills, as she has shared what works for her with him. Client discussed taking time out to take a break in her car, take a bubble bath, go to her deck to get fresh air  and to do deep breaths. Counselor discussed strategies in maximizing the benefits of grounding and relaxation techniques and how to be intentional with her practices. Client engaged in discussion describing application ideas.   Assessment and Plan: Counselor will continue to meet with patient to address treatment plan goals. Patient will continue to follow recommendations of providers and implement skills learned in session.  Follow Up Instructions: Counselor will send information for next session via Webex.   The patient was advised to call back or seek an in-person evaluation if the symptoms worsen or if the condition fails to improve as anticipated.  I provided 60 minutes of non-face-to-face time during this encounter.   Lise Auer, LCSW

## 2020-09-25 ENCOUNTER — Ambulatory Visit (HOSPITAL_COMMUNITY): Payer: 59 | Admitting: Psychiatry

## 2020-09-25 ENCOUNTER — Other Ambulatory Visit: Payer: Self-pay

## 2020-09-26 ENCOUNTER — Telehealth (INDEPENDENT_AMBULATORY_CARE_PROVIDER_SITE_OTHER): Payer: 59 | Admitting: Psychiatry

## 2020-09-26 ENCOUNTER — Other Ambulatory Visit: Payer: Self-pay

## 2020-09-26 DIAGNOSIS — F4312 Post-traumatic stress disorder, chronic: Secondary | ICD-10-CM

## 2020-09-26 DIAGNOSIS — F410 Panic disorder [episodic paroxysmal anxiety] without agoraphobia: Secondary | ICD-10-CM

## 2020-09-26 DIAGNOSIS — F3181 Bipolar II disorder: Secondary | ICD-10-CM

## 2020-09-26 DIAGNOSIS — F411 Generalized anxiety disorder: Secondary | ICD-10-CM

## 2020-09-26 MED ORDER — VILAZODONE HCL 20 MG PO TABS: 20.0000 mg | ORAL_TABLET | Freq: Every day | ORAL | 0 refills | Status: DC
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
## 2020-10-15 ENCOUNTER — Encounter (HOSPITAL_COMMUNITY): Payer: Self-pay | Admitting: Psychiatry

## 2020-10-15 ENCOUNTER — Ambulatory Visit (INDEPENDENT_AMBULATORY_CARE_PROVIDER_SITE_OTHER): Payer: 59 | Admitting: Psychiatry

## 2020-10-15 ENCOUNTER — Other Ambulatory Visit: Payer: Self-pay

## 2020-10-15 DIAGNOSIS — F4312 Post-traumatic stress disorder, chronic: Secondary | ICD-10-CM | POA: Diagnosis not present

## 2020-10-15 DIAGNOSIS — F3181 Bipolar II disorder: Secondary | ICD-10-CM

## 2020-10-15 NOTE — Progress Notes (Signed)
Virtual Visit via Video Note  I connected with [name] on 10/15/20 at  1:30 PM EST by a video enabled telemedicine application and verified that I am speaking with the correct person using two identifiers.  Location: Patient: Patient Home Provider: Home Office  I discussed the limitations of evaluation and management by telemedicine and the availability of in person appointments. The patient expressed understanding and agreed to proceed.  History of Present Illness: Bipolar 2 DO, Major Depressive type, PTSD  Treatment Plan Goals: 1)[name] would like to implement more self-care practices into life to reduce stress and anxiety. 2)[name] would like to process and applying grounding and mindfulness practices when experiencing trauma triggers and reminders.  Observations/Objective: Counselor met with Client for individual therapy via Webex. Counselor assessed MH symptoms and progress on treatment plan goals, with patient reportingthatshe that she is currently stressed related to work responsibilities, but overall "things are improving" .Client presents withmilddepression andmoderateanxiety. Client denied suicidal ideation or self-harm behaviors.  Goal 1 and 2: Counselor used MI and CBT interventions to process thoughts and feelings related to the Client's named stressors, of work, parenting during pandemic, recent engagement, arguments with community members and open CPS report. Client stated that she is applying self-care making time to get away from things by doing small road trips, communicating and processing feelings with daughter, mother and partner, asking for help with parenting responsibilities by mom and partner and stepping outside when she needs a "mental reset." Client discussed ways she managed communications with CPS worker, feeling more empowered and confident in knowing how to handle self, request/state rights and engage in the process calmly. Counselor assessed  daily functioning and application of skills, considering external stressors. Client named ability to more effectively and routinely incorporate her support system.  Counselor assessed application of mindfulness and grounding techniques used to combat depression. Client excited and looking forward to upcoming wedding/marriage. Counselor challenged Client to relationship build with partner, through the assistance of a couples counselor or marriage counselor to be preventative in future conflicts/issues. Client confirmed that she and her partner are meet with a faith leader for service.   Assessment and Plan: Counselor will continue to meet with patient to address treatment plan goals. Patient will continue to follow recommendations of providers and implement skills learned in session.  Follow Up Instructions: Counselor will send information for next session via Webex.   The patient was advised to call back or seek an in-person evaluation if the symptoms worsen or if the condition fails to improve as anticipated.  I provided60 minutes of non-face-to-face time during this encounter.   Lise Auer, LCSW

## 2020-10-26 ENCOUNTER — Other Ambulatory Visit: Payer: Self-pay

## 2020-10-26 ENCOUNTER — Telehealth (INDEPENDENT_AMBULATORY_CARE_PROVIDER_SITE_OTHER): Payer: 59 | Admitting: Psychiatry

## 2020-10-26 DIAGNOSIS — F410 Panic disorder [episodic paroxysmal anxiety] without agoraphobia: Secondary | ICD-10-CM

## 2020-10-26 DIAGNOSIS — F4312 Post-traumatic stress disorder, chronic: Secondary | ICD-10-CM | POA: Diagnosis not present

## 2020-10-26 DIAGNOSIS — F3181 Bipolar II disorder: Secondary | ICD-10-CM | POA: Diagnosis not present

## 2020-10-26 DIAGNOSIS — F411 Generalized anxiety disorder: Secondary | ICD-10-CM

## 2020-10-26 MED ORDER — CLONAZEPAM 0.5 MG PO TABS: 0.5000 mg | ORAL_TABLET | Freq: Two times a day (BID) | ORAL | 2 refills | Status: DC | PRN

## 2020-10-26 MED ORDER — VILAZODONE HCL 20 MG PO TABS: 20.0000 mg | ORAL_TABLET | Freq: Every day | ORAL | 2 refills | Status: DC

## 2020-10-26 MED ORDER — PRAZOSIN HCL 2 MG PO CAPS: 2.0000 mg | ORAL_CAPSULE | Freq: Every day | ORAL | 0 refills | Status: DC

## 2020-10-26 MED ORDER — LAMOTRIGINE 200 MG PO TABS: 200.0000 mg | ORAL_TABLET | Freq: Every day | ORAL | 2 refills | Status: DC
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
## 2020-10-30 ENCOUNTER — Ambulatory Visit (INDEPENDENT_AMBULATORY_CARE_PROVIDER_SITE_OTHER): Payer: 59 | Admitting: Psychiatry

## 2020-10-30 ENCOUNTER — Other Ambulatory Visit: Payer: Self-pay

## 2020-10-30 ENCOUNTER — Encounter (HOSPITAL_COMMUNITY): Payer: Self-pay | Admitting: Psychiatry

## 2020-10-30 DIAGNOSIS — F3181 Bipolar II disorder: Secondary | ICD-10-CM

## 2020-10-30 NOTE — Progress Notes (Signed)
Virtual Visit via Video Note  I connected with [name] on 10/30/20 at  3:30 PM EST by a video enabled telemedicine application and verified that I am speaking with the correct person using two identifiers.  Location: Patient: Patient Home Provider: Home Office  I discussed the limitations of evaluation and management by telemedicine and the availability of in person appointments. The patient expressed understanding and agreed to proceed.  History of Present Illness: Bipolar 2 DO, Major Depressive type, PTSD  Treatment Plan Goals: 1)[place] would like to implement more self-care practices into life to reduce stress and anxiety. 2)Chaselynn would like to process and applying grounding and mindfulness practices when experiencing trauma triggers and reminders.  Observations/Objective: Counselor met with Client for individual therapy via Webex. Counselor assessed MH symptoms and progress on treatment plan goals, with patient reportingthatshe that she has been experiencing more grief and loss reminders and triggers related to the passing of her father 4 years ago. Client states she is stable, but sad at times.Client presents withmilddepression andmoderateanxiety. Client denied suicidal ideation or self-harm behaviors.  Goal 1and2:Counselor used MI and CBT interventions to process thoughts and feelings related to the Client's named stressors, of work, parenting and open CPS report. Client reported on updates on wedding plans, following up on homework of having preparatory discussions and counseling with future spouse. Counselor and Client discussed and prepared for anticipatory stress and potential challenges. Client reports that CPS case remains open, with no activity and concerns. Counselor processed grief responses to her missing her late father and how she will honor him at the wedding. Counselor encouraged Client to keep lines of communication open with fiance and support  system, as to prevent becoming overwhelmed.  Assessment and Plan: Counselor will continue to meet with patient to address treatment plan goals. Patient will continue to follow recommendations of providers and implement skills learned in session.  Follow Up Instructions: Counselor will send information for next session via Webex.   The patient was advised to call back or seek an in-person evaluation if the symptoms worsen or if the condition fails to improve as anticipated.  I provided96mnutes of non-face-to-face time during this encounter.   BLise Auer LCSW

## 2020-11-16 DIAGNOSIS — R4589 Other symptoms and signs involving emotional state: Secondary | ICD-10-CM | POA: Insufficient documentation

## 2020-11-19 ENCOUNTER — Other Ambulatory Visit: Payer: Self-pay

## 2020-11-19 ENCOUNTER — Encounter (HOSPITAL_COMMUNITY): Payer: Self-pay | Admitting: Psychiatry

## 2020-11-19 ENCOUNTER — Ambulatory Visit (HOSPITAL_COMMUNITY): Payer: 59 | Admitting: Psychiatry

## 2020-11-19 DIAGNOSIS — F3181 Bipolar II disorder: Secondary | ICD-10-CM

## 2020-11-19 NOTE — Progress Notes (Signed)
Counselor connected with Client who is currently department with daughter who was in an accident at school while playing. Client gave brief updates and requested to postpone today's session. Counselor to meet with Client in 2 weeks.   Lise Auer, LCSW

## 2020-11-27 ENCOUNTER — Telehealth: Payer: 59 | Admitting: Psychiatry

## 2020-11-27 ENCOUNTER — Other Ambulatory Visit: Payer: Self-pay

## 2020-11-27 DIAGNOSIS — F3181 Bipolar II disorder: Secondary | ICD-10-CM

## 2020-11-27 DIAGNOSIS — F410 Panic disorder [episodic paroxysmal anxiety] without agoraphobia: Secondary | ICD-10-CM | POA: Diagnosis not present

## 2020-11-27 DIAGNOSIS — F411 Generalized anxiety disorder: Secondary | ICD-10-CM

## 2020-11-27 MED ORDER — VILAZODONE HCL 20 MG PO TABS: 20.0000 mg | ORAL_TABLET | Freq: Every day | ORAL | 2 refills | Status: DC

## 2020-11-27 MED ORDER — ESZOPICLONE 3 MG PO TABS: 3.0000 mg | ORAL_TABLET | Freq: Every evening | ORAL | 2 refills | Status: DC | PRN
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
## 2020-12-03 ENCOUNTER — Other Ambulatory Visit: Payer: Self-pay

## 2020-12-03 ENCOUNTER — Encounter (HOSPITAL_COMMUNITY): Payer: Self-pay | Admitting: Psychiatry

## 2020-12-03 ENCOUNTER — Ambulatory Visit (INDEPENDENT_AMBULATORY_CARE_PROVIDER_SITE_OTHER): Payer: 59 | Admitting: Psychiatry

## 2020-12-03 DIAGNOSIS — F411 Generalized anxiety disorder: Secondary | ICD-10-CM

## 2020-12-03 DIAGNOSIS — F3181 Bipolar II disorder: Secondary | ICD-10-CM | POA: Diagnosis not present

## 2020-12-03 NOTE — Progress Notes (Signed)
Virtual Visit via Video Note  I connected with [name] on 12/03/20 at  2:30 PM EDT by a video enabled telemedicine application and verified that I am speaking with the correct person using two identifiers.  Location: Patient: Patient Home Provider: Home Office  I discussed the limitations of evaluation and management by telemedicine and the availability of in person appointments. The patient expressed understanding and agreed to proceed.  History of Present Illness: Bipolar 2 DO, Major Depressive type, PTSD  Treatment Plan Goals: 1)[name] would like to implement more self-care practices into life to reduce stress and anxiety. 2)[name] would like to process and applying grounding and mindfulness practices when experiencing trauma triggers and reminders.  Observations/Objective: Counselor met with Client for individual therapy via Webex. Counselor assessed MH symptoms and progress on treatment plan goals, with patient reportingClient presents withmilddepression andmoderateanxiety. Client denied suicidal ideation or self-harm behaviors.  Goal 1and2:Counselor used MI and CBT interventions to process thoughts and feelings related to the Client's named stressors, of work, parenting and open CPS report. Client reported on updates on wedding plans and disappointments experienced. Counselor prompted self-care moments and daily routines to engage in to keep energy and focus during the process. Client named several ideas she has to incorporate more "me time" in her day. Client stated that she enjoys sensory related grounding techniques and "the act of being present" with daughter and fiance. Counselor and Client discussed crisis plan for managing management of mental health throughout her wedding planning and experience.   Assessment and Plan: Counselor will continue to meet with patient to address treatment plan goals. Patient will continue to follow recommendations of providers  and implement skills learned in session.  Follow Up Instructions: Counselor will send information for next session via Webex.   The patient was advised to call back or seek an in-person evaluation if the symptoms worsen or if the condition fails to improve as anticipated.  I provided66mnutes of non-face-to-face time during this encounter.   BLise Auer LCSW

## 2020-12-06 ENCOUNTER — Other Ambulatory Visit: Payer: Self-pay | Admitting: Physician Assistant

## 2020-12-06 ENCOUNTER — Ambulatory Visit
Admission: RE | Admit: 2020-12-06 | Discharge: 2020-12-06 | Disposition: A | Discharge status: Home / Self Care | Payer: Medicaid Other | Source: Ambulatory Visit | Attending: Physician Assistant | Admitting: Physician Assistant

## 2020-12-06 ENCOUNTER — Telehealth (HOSPITAL_COMMUNITY): Payer: Self-pay | Admitting: *Deleted

## 2020-12-06 DIAGNOSIS — R1030 Lower abdominal pain, unspecified: Secondary | ICD-10-CM

## 2020-12-06 DIAGNOSIS — R109 Unspecified abdominal pain: Secondary | ICD-10-CM

## 2020-12-06 NOTE — Telephone Encounter (Signed)
PA submitted for Eszopiclone 3 mg via CoverMyMeds. PA case # [id number]. Key # [id number].

## 2020-12-18 ENCOUNTER — Other Ambulatory Visit: Payer: Self-pay | Admitting: Internal Medicine

## 2020-12-18 ENCOUNTER — Telehealth: Payer: Self-pay | Admitting: Internal Medicine

## 2020-12-18 MED ORDER — ALBUTEROL SULFATE (2.5 MG/3ML) 0.083% IN NEBU: 2.5000 mg | INHALATION_SOLUTION | Freq: Four times a day (QID) | RESPIRATORY_TRACT | 12 refills | Status: DC | PRN

## 2020-12-18 NOTE — Telephone Encounter (Signed)
Reports Influenza A and asks refill neb albuterol. Refill sent.

## 2020-12-20 ENCOUNTER — Inpatient Hospital Stay: Admission: RE | Admit: 2020-12-20 | Payer: Medicaid Other | Source: Ambulatory Visit

## 2020-12-21 ENCOUNTER — Other Ambulatory Visit (HOSPITAL_COMMUNITY): Payer: Self-pay | Admitting: Psychiatry

## 2020-12-21 MED ORDER — TEMAZEPAM 30 MG PO CAPS: 30.0000 mg | ORAL_CAPSULE | Freq: Every evening | ORAL | 0 refills | Status: DC | PRN

## 2020-12-25 ENCOUNTER — Ambulatory Visit (INDEPENDENT_AMBULATORY_CARE_PROVIDER_SITE_OTHER): Payer: 59 | Admitting: Psychiatry

## 2020-12-25 ENCOUNTER — Encounter (HOSPITAL_COMMUNITY): Payer: Self-pay | Admitting: Psychiatry

## 2020-12-25 ENCOUNTER — Other Ambulatory Visit: Payer: Self-pay

## 2020-12-25 DIAGNOSIS — F3181 Bipolar II disorder: Secondary | ICD-10-CM | POA: Diagnosis not present

## 2020-12-25 DIAGNOSIS — F411 Generalized anxiety disorder: Secondary | ICD-10-CM | POA: Diagnosis not present

## 2020-12-25 NOTE — Progress Notes (Signed)
Virtual Visit via Video Note  I connected with [name] on 12/25/20 at  2:30 PM EDT by a video enabled telemedicine application and verified that I am speaking with the correct person using two identifiers.  Location: Patient: Patient Home Provider: Home Office  I discussed the limitations of evaluation and management by telemedicine and the availability of in person appointments. The patient expressed understanding and agreed to proceed.  History of Present Illness: Bipolar 2 DO, Major Depressive type, PTSD  Treatment Plan Goals: 1)[name] would like to implement more self-care practices into life to reduce stress and anxiety. 2)[name] would like to process and applying grounding and mindfulness practices when experiencing trauma triggers and reminders.  Observations/Objective: Counselor met with Client for individual therapy via Webex. Counselor assessed MH symptoms and progress on treatment plan goals, with patient reporting that on updates about recent marriage and life changes.Client presents withmilddepression andmoderateanxiety. Client denied suicidal ideation or self-harm behaviors.  Goal 1and2:Counselor used MI and CBT interventions to process thoughts and feelings related to the Client's named stressors of work, parenting, partnering and extended family. Counselor assessed application of self-care practices and mindfulness. Client discussed how she dealt with stress of wedding and daughters health issues. Client identified practice of being present, calming internal self, deep breathes and slowing down responses. Client noted that skill application has been helping reduce stress. Counselor and Client processed ways in which she anticipates upcoming life changes in health and work. Client reports overall success in maintaining mental health.    Assessment and Plan: Counselor will continue to meet with patient to address treatment plan goals. Patient will  continue to follow recommendations of providers and implement skills learned in session.  Follow Up Instructions: Counselor will send information for next session via Webex.   The patient was advised to call back or seek an in-person evaluation if the symptoms worsen or if the condition fails to improve as anticipated.  I provided16mnutes of non-face-to-face time during this encounter.   BLise Auer LCSW

## 2021-01-07 ENCOUNTER — Encounter (HOSPITAL_COMMUNITY): Payer: Self-pay | Admitting: Psychiatry

## 2021-01-07 ENCOUNTER — Telehealth (INDEPENDENT_AMBULATORY_CARE_PROVIDER_SITE_OTHER): Payer: 59 | Admitting: Psychiatry

## 2021-01-07 ENCOUNTER — Other Ambulatory Visit: Payer: Self-pay

## 2021-01-07 DIAGNOSIS — F3341 Major depressive disorder, recurrent, in partial remission: Secondary | ICD-10-CM

## 2021-01-07 NOTE — Progress Notes (Addendum)
BH MD/PA/NP OP Progress Note  01/07/2021 9:33 AM [name]  MRN:  [id number] Interview was conducted by phone and I verified that I was speaking with the correct person using two identifiers. I discussed the limitations of evaluation and management by telemedicine and  the availability of in person appointments. Patient expressed understanding and agreed to proceed. Participants in the visit: patient (location - home); physician (location - home office).  Duration 20 minutes   Chief Complaint: Occasional anxiety, insomnia.  HPI: 40 yo divorced female with hx of PTSD, bipolar 2 disorder and GAD/panic disorder.    Patient was being followed by Dr Montel Culver, who has left practice .  She reports she found out she is pregnant about 3 weeks ago. States she is currently about 11-12 weeks .  She decided to stop her psychiatric medications and is currently not taking any psychiatric medications. She is seeing a therapist and is working to increase frequency of individual psychotherapy sessions .   Her most recently prescribed psychiatric medications included Klonopin 0.5 mgr BID PRN, Lunesta 3 mgr QHS  PRN, Lamictal 200 mgr QDAY , Minipress 2 mg QHS, Viibryd 20 mgr daily .  As noted, she has discontinued these medications once she found out she was pregnant . Denies any withdrawal symptoms.  At this time reports she remains stable and denies significantly worsening mood or decreased ability to function in daily activities . She has noted some increased anxiety. Denies any suicidal or self injurious ideations, denies psychotic symptoms.  We reviewed risk vs benefit associated with psychiatric medications/pregnancy, including potential risks to pregnancy/child versus increased risk of psychiatric decompensation off psychiatric medications .  At this time patient's preference is NOT to resume psychiatric medications while she is pregnant . She will see her therapist regularly.  She does agree to  contact clinic promptly should there be any worsening symptoms and have recommended she go to ED if any suicidal ideations .  We also reviewed benefits of nicotine abstinence ( she is not smoking ) and alcohol abstinence ( she denies alcohol use ) in the context of pregnancy.  She has initiated  prenatal care follow ups .              Visit Diagnosis:  No diagnosis found.  Past Psychiatric History: Please see intake H&P.  Past Medical History:  Past Medical History:  Diagnosis Date   Abscess of left axilla - PREVOTELLA BIVIA & Staph Coag Neg 07/12/2012   MODERATE PREVOTELLA BIVIA Note: BETA LACTAMASE NEGATIVE     Asthma    as a child   Cancer ([place])    skin tag rt breast   Cellulitis 06/01/2014   RT INNER THIGH   Depression    hx   Diabetes ([place])    Diabetes mellitus    IDDM, Insulin pump; followed by Dr. [name]   GERD (gastroesophageal reflux disease)    no current meds.   Heart murmur    Hidradenitis 04/2012   bilat. thighs, left groin - open areas on thighs   Hx MRSA infection    Hydradenitis    PCOS (polycystic ovarian syndrome)    Pneumonia    hx   SVT (supraventricular tachycardia) ([place]) 06/2017   Vitamin D insufficiency     Past Surgical History:  Procedure Laterality Date   BREAST EXCISIONAL BIOPSY Right    BREAST SURGERY     right lumpectomy   BUNIONECTOMY     left   CARDIAC CATHETERIZATION  CHOLECYSTECTOMY  12/02/2006   lap. chole.   DILATION AND EVACUATION  02/14/2007   ESOPHAGOGASTRODUODENOSCOPY  11/17/2011   Procedure: ESOPHAGOGASTRODUODENOSCOPY (EGD);  Surgeon: [name], MD;  Location: [facility];  Service: Endoscopy;  Laterality: N/A;   EYE SURGERY     exc. stye left eye   HYDRADENITIS EXCISION  04/30/2012   Procedure: EXCISION HYDRADENITIS GROIN;  Surgeon: [name], MD;  Location: [facility];  Service: General;  Laterality: Left;  wide excision hidradenitis bilateral thighs and Left groin    HYDRADENITIS EXCISION Left 01/13/2014   Procedure: WIDE EXCISION HIDRADENITIS LEFT AXILLA;  Surgeon: [name], MD;  Location: [facility];  Service: General;  Laterality: Left;   INCISION AND DRAINAGE ABSCESS Right 03/17/2019   Procedure: INCISION AND DRAINAGE RIGHT THIGH ABSCESS;  Surgeon: [name], MD;  Location: [facility];  Service: General;  Laterality: Right;   IRRIGATION AND DEBRIDEMENT ABSCESS Right 06/02/2014   Procedure: IRRIGATION AND DEBRIDEMENT ABSCESS;  Surgeon: [name], MD;  Location: [facility];  Service: General;  Laterality: Right;   IRRIGATION AND DEBRIDEMENT ABSCESS Left 09/23/2014   Procedure: IRRIGATION AND DEBRIDEMENT ABSCESS/LEFT THIGH;  Surgeon: [name], MD;  Location: [facility];  Service: General;  Laterality: Left;   LEFT HEART CATHETERIZATION WITH CORONARY ANGIOGRAM N/A 07/14/2014   Procedure: LEFT HEART CATHETERIZATION WITH CORONARY ANGIOGRAM;  Surgeon: [name], MD;  Location: [facility] LAB;  Service: Cardiovascular;  Laterality: N/A;   TONSILLECTOMY     WISDOM TOOTH EXTRACTION      Family Psychiatric History: Reviewed.  Family History:  Family History  Problem Relation Age of Onset   Cancer Mother    Anxiety disorder Mother    Depression Mother    Sexual abuse Mother    Breast cancer Mother 41   Hyperlipidemia Sister    Hypertension Sister    Sexual abuse Sister    Hypertension Father    Anxiety disorder Father    Depression Father    Drug abuse Father    Stroke Paternal Uncle    ADD / ADHD Paternal Uncle    Anxiety disorder Paternal Uncle    Anxiety disorder Maternal Aunt    Seizures Maternal Aunt    Anxiety disorder Paternal Aunt    Anxiety disorder Maternal Uncle    ADD / ADHD Cousin    ADD / ADHD Daughter     Social History:  Social History   Socioeconomic History   Marital status: Single    Spouse name: Not on file   Number of children: Not on file   Years of education: Not on file   Highest education level: Not on file   Occupational History   Not on file  Tobacco Use   Smoking status: Current Every Day Smoker    Packs/day: 1.00    Years: 23.00    Pack years: 23.00    Types: Cigarettes   Smokeless tobacco: Never Used   Tobacco comment: Has cut back to 1/2 to 3/4 and wants to begin useing patches again once anxiety subsides  Vaping Use   Vaping Use: Never used  Substance and Sexual Activity   Alcohol use: No    Alcohol/week: 0.0 standard drinks   Drug use: Yes    Types: Marijuana    Comment: Last use 3 weeks prior    Sexual activity: Yes    Partners: Male    Birth control/protection: I.U.D.  Other Topics Concern   Not on file  Social  History Narrative   Not on file   Social Determinants of Health   Financial Resource Strain: Not on file  Food Insecurity: Not on file  Transportation Needs: Not on file  Physical Activity: Not on file  Stress: Not on file  Social Connections: Not on file    Allergies:  Allergies  Allergen Reactions   Latex Hives, Shortness Of Breath and Rash   Levofloxacin Shortness Of Breath and Rash   Moxifloxacin Shortness Of Breath and Rash   Oxycodone-Acetaminophen Shortness Of Breath, Swelling and Rash    NORCO/VICODIN OK   Peach [Prunus Persica] Hives and Shortness Of Breath   Potassium-Containing Compounds Other (See Comments)    IV ROUTE - CAUSES VEINS TO COLLAPS; Reports that it is undiluted K only   Prednisone Other (See Comments)    SEVERE ELEVATION OF BLOOD SUGAR. Able to tolerate 40 mg   Propoxyphene N-Acetaminophen Swelling    SWELLING OF FACE AND THROAT   Rosiglitazone Maleate Swelling    SWELLING OF FACE AND LEGS   Xolair [Omalizumab] Other (See Comments)    Rash and anaphylaxis   Adhesive [Tape] Hives, Itching and Rash   Morphine And Related Other (See Comments)    Causes hallucinations   Prozac [Fluoxetine Hcl] Other (See Comments)    Made her very aggressive    Chantix [Varenicline]     dreams   Citrullus Vulgaris Nausea And Vomiting     Facial swelling   Clindamycin/Lincomycin    Humira [Adalimumab]     Does not remember    Septra [Sulfamethoxazole-Trimethoprim]    Zyvox [Linezolid]     Edema    Cefaclor Rash   Keflex [Cephalexin] Diarrhea and Rash    REACTION: severe migraine   Promethazine Hcl Other (See Comments)    IV ROUTE ONLY - JITTERY FEELING. Patient reports that it is mild and she has used promethazine since then  PO tablet ok   Sulfadiazine Rash    Metabolic Disorder Labs: Lab Results  Component Value Date   HGBA1C 10.6 (H) 10/04/2018   MPG 280.48 12/25/2017   MPG 246 (H) 06/01/2014   No results found for: PROLACTIN Lab Results  Component Value Date   CHOL 193 03/22/2018   TRIG 90.0 03/22/2018   HDL 62.80 03/22/2018   CHOLHDL 3 03/22/2018   VLDL 18.0 03/22/2018   LDLCALC 113 (H) 03/22/2018   LDLCALC 88 12/19/2015   Lab Results  Component Value Date   TSH 1.43 03/22/2018   TSH 0.773 12/25/2017    Therapeutic Level Labs: No results found for: LITHIUM No results found for: VALPROATE No components found for:  CBMZ  Current Medications: Current Outpatient Medications  Medication Sig Dispense Refill   acetaminophen (TYLENOL) 500 MG tablet Take 2 tablets (1,000 mg total) by mouth every 6 (six) hours as needed for mild pain or fever.     albuterol (PROVENTIL) (2.5 MG/3ML) 0.083% nebulizer solution Take 3 mLs (2.5 mg total) by nebulization every 6 (six) hours as needed for wheezing or shortness of breath. 75 mL 12   azithromycin (ZITHROMAX) 250 MG tablet 2 today then one daily 6 tablet 0   clonazePAM (KLONOPIN) 0.5 MG tablet Take 1 tablet (0.5 mg total) by mouth 2 (two) times daily as needed for anxiety. 60 tablet 2   doxycycline (VIBRA-TABS) 100 MG tablet TAKE 1 TABLET BY MOUTH 2 TIMES DAILY. (Patient taking differently: Take 100 mg by mouth 2 (two) times daily. ) 14 tablet prn   Eszopiclone 3 MG  TABS Take 1 tablet (3 mg total) by mouth at bedtime as needed. Take immediately before bedtime 15  tablet 2   glucose blood ([employer] TEST STRIPS) test strip 1 each by Other route See admin instructions.      HUMALOG 100 UNIT/ML injection 20-22 units pre-meal as per sliding scale 10 mL 11   insulin glargine (LANTUS) 100 UNIT/ML injection INJECT 60 UNITS UNDER THE SKIN ONCE DAILY AS DIRECTED 60 mL 3   lamoTRIgine (LAMICTAL) 200 MG tablet Take 1 tablet (200 mg total) by mouth daily. 30 tablet 2   levonorgestrel (MIRENA) 20 MCG/24HR IUD 1 each by Intrauterine route once. Placed 04/2013     mupirocin ointment (BACTROBAN) 2 % Apply 1 application topically daily as needed.  2   ondansetron (ZOFRAN ODT) 8 MG disintegrating tablet Take 1 tablet (8 mg total) by mouth every 8 (eight) hours as needed for nausea or vomiting. 20 tablet 1   pantoprazole (PROTONIX) 40 MG tablet Take 1 tablet (40 mg total) by mouth daily. 90 tablet 1   prazosin (MINIPRESS) 2 MG capsule Take 1 capsule (2 mg total) by mouth at bedtime. 90 capsule 0   spironolactone (ALDACTONE) 100 MG tablet Take 1 tablet (100 mg total) by mouth daily. 30 tablet 3   temazepam (RESTORIL) 30 MG capsule Take 1 capsule (30 mg total) by mouth at bedtime as needed for sleep. 30 capsule 0   traMADol (ULTRAM) 50 MG tablet Take 1 tablet (50 mg total) by mouth every 6 (six) hours as needed for moderate pain. 30 tablet 0   [START ON 01/24/2021] Vilazodone HCl 20 MG TABS Take 1 tablet (20 mg total) by mouth daily. 30 tablet 2   No current facility-administered medications for this visit.      Psychiatric Specialty Exam: please take into account limitations in obtaining a full MSE in the context of phone communication Review of Systems  Psychiatric/Behavioral: Positive for sleep disturbance.  All other systems reviewed and are negative.   There were no vitals taken for this visit.There is no height or weight on file to calculate BMI.  General Appearance: NA  Eye Contact:  NA  Speech:  Normal   Volume:  Normal  Mood:  Reports mood is "OK", denies  worsening depression, endorses some anxiety   Affect:  NA  Thought Process:  Goal Directed and Linear  Orientation:  Full (Time, Place, and Person)  Thought Content: linear   Suicidal Thoughts:  No- denies suicidal ideations, and focused on pregnancy and health of her child at this time.   Homicidal Thoughts:  No  Memory:  Recent and remote grossly intact   Judgement:  Present   Insight:  Present   Psychomotor Activity:  NA  Concentration:  Grossly intact    Recall:  Good  Fund of Knowledge: Good  Language: Good  Akathisia:  Negative  Handed:  Right  AIMS (if indicated): not done  Assets:  Communication Skills Desire for Improvement Financial Resources/Insurance Housing Social Support Talents/Skills  ADL's:  Intact  Cognition: WNL  Sleep:  Fair   Screenings: GAD-7    [place] Office Visit from 04/04/2019 in [facility] Counselor from 12/14/2018 in [facility] Counselor from 11/24/2018 in [facility]  Total GAD-7 Score 15 17 19       PHQ2-9    [place] Office Visit from 04/04/2019 in [facility] Counselor from 12/14/2018 in [facility]  PARTIAL HOSPITALIZATION PROGRAM Counselor from 11/24/2018 in [facility] Patient Outreach from 03/14/2015 in [place]  PHQ-2 Total Score 4 4 6 1   PHQ-9 Total Score 16 19 24  --        Assessment and Plan: 40 yo divorced (but soon to be married) female with hx of PTSD, bipolar 2 disorder and GAD/panic disorder.    Dx: Bipolar 2 disorder; Panic disorder/GAD/PTSD; Hx of ADHD dx.   Patient reports she found out she is pregnant about three weeks ago. She has stopped all her psychiatric medications and at this time reported preference is not to resume med management and manage mood disorder /anxiety by increasing frequency of  psychotherapy. Currently she reports she is doing well and denies worsening depression. Reports she is functioning well in daily activities . Denies SI.  We have reviewed medications and associated pregnancy risks. We have also reviewed increased risk of decompensation off psychiatric medications .  As per above , no medications prescribed at this time. Will see in 3-4 weeks. Agrees to contact clinic if any worsening prior . Instructed to go to ED promptly if any significant worsening or emergence of SI. Continue individual psychotherapy .   Jenne Campus, MD 01/07/2021, 9:33 AM

## 2021-01-09 ENCOUNTER — Inpatient Hospital Stay: Admission: RE | Admit: 2021-01-09 | Payer: Medicaid Other | Source: Ambulatory Visit

## 2021-01-17 LAB — OB RESULTS CONSOLE GC/CHLAMYDIA
Chlamydia: NEGATIVE
Gonorrhea: NEGATIVE

## 2021-01-17 LAB — OB RESULTS CONSOLE HEPATITIS B SURFACE ANTIGEN: Hepatitis B Surface Ag: NEGATIVE

## 2021-01-17 LAB — OB RESULTS CONSOLE GBS: GBS: POSITIVE

## 2021-01-17 LAB — OB RESULTS CONSOLE RUBELLA ANTIBODY, IGM: Rubella: IMMUNE

## 2021-02-04 ENCOUNTER — Ambulatory Visit (HOSPITAL_COMMUNITY): Payer: 59 | Admitting: Psychiatry

## 2021-02-04 ENCOUNTER — Encounter (HOSPITAL_COMMUNITY): Payer: Self-pay | Admitting: Psychiatry

## 2021-02-04 ENCOUNTER — Other Ambulatory Visit: Payer: Self-pay

## 2021-02-04 DIAGNOSIS — F3181 Bipolar II disorder: Secondary | ICD-10-CM

## 2021-02-04 NOTE — Progress Notes (Signed)
Counselor waited on Web[facility] for 15 minutes without arrival. Counselor sent e-mail and called leaving a vm with no response.   Lise Auer, LCSW

## 2021-02-19 ENCOUNTER — Other Ambulatory Visit: Payer: Self-pay

## 2021-02-19 ENCOUNTER — Telehealth (INDEPENDENT_AMBULATORY_CARE_PROVIDER_SITE_OTHER): Payer: 59 | Admitting: Psychiatry

## 2021-02-19 ENCOUNTER — Telehealth (HOSPITAL_COMMUNITY): Payer: Self-pay | Admitting: Psychiatry

## 2021-02-19 ENCOUNTER — Encounter (HOSPITAL_COMMUNITY): Payer: Self-pay | Admitting: Psychiatry

## 2021-02-19 DIAGNOSIS — F3181 Bipolar II disorder: Secondary | ICD-10-CM

## 2021-02-19 NOTE — Telephone Encounter (Signed)
D:  Dr. Parke Poisson referred pt to [facility].  A:  Placed call to re-orient pt.  According to Epic, pt has [place], but pt states she no longer has [place], but has [employer].  Informed pt that [facility] doesn't accept MCD; provided pt with [facility] # [phone]) for [facility] among other free groups.  R:  Pt receptive.

## 2021-02-19 NOTE — Progress Notes (Signed)
[facility] MD/PA/NP OP Progress Note  02/19/2021 3:20 PM [name]  MRN:  [id number] Interview was conducted by phone and I verified that I was speaking with the correct person using two identifiers. I discussed the limitations of evaluation and management by telemedicine and  the availability of in person appointments. Patient expressed understanding and agreed to proceed. Participants in the visit: patient (location - home); physician (location - home office).Duration 20 minutes   Chief Complaint:  returns for medication management   HPI: 40yo divorced femalewith hx of PTSD, bipolar 2 disorder andGAD/panic disorder.   Patient reports she is now in her 20th week of pregnancy. She has been following with prenatal care and has an established OB specialist . She states she had a recent obstetric sonogram and found out she is expecting a boy. She reports she is taking prenatal vitamin regularly .  As noted in prior note, she reported she stopped all her psychiatric medications when she found out she was pregnant , and is currently not taking psychiatric medication.   She states that in general she has been doing " all right" but does endorse some subjective affective lability, characterized as mild, and anxiety. She is not endorsing worsening or significant depression at this time . She exhibits a full range of affect . She denies any suicidal ideations and is future oriented, no psychotic symptoms, and is future oriented, expressing she is  happy to be expecting her second child.  She states she would like to explore the option restarting some psychiatric medications , in particular if she feels that anxiety worsens as pregnancy progresses or if she has increased mood symptoms. States " right now I am doing ok " but wants to have the option of restarting medications if needed . She states her plan is to definitely restart standing psychiatric medication management once she delivers .  We reviewed  medications - she had been on Lamictal, Klonopin, Viibryd, [place], prior to pregnancy. She reports was tolerating well . As noted, she is no longer taking any of these . Another consideration is that patient has IDDM.  We reviewed pregnancy risks /precautions listed for these medications and discussed risk versus benefits associated . Within (antiseizure) mood stabilizers lamotrigine may have a more favorable profile for use in pregnancy than others . She has not been on atypical antipsychotic medications in the past and due to DM would prefer to minimize medications commonly associated with weight gain or metabolic side effects. States she would prefer to resume Lamotrigine than other mood stabilizer options.  With regards to management of anxiety or insomnia, Benadryl PRN could be considered, rather than resuming Klonopin. With respect to antidepressant/ anti-anxiety  medication, would consider Sertraline as an option.  Patient reports she plans to discuss these possible medication options with her OB on their next appointment which is next week, to help her make  decision regarding starting above medications or not with her OB . For now, reports she is stable and will not resume any meds at this time.              Visit Diagnosis:  No diagnosis found.  Past Psychiatric History: Please see intake H&P.  Past Medical History:  Past Medical History:  Diagnosis Date  . Abscess of left axilla - PREVOTELLA BIVIA & Staph Coag Neg 07/12/2012   MODERATE PREVOTELLA BIVIA Note: BETA LACTAMASE NEGATIVE    . Asthma    as a child  . Cancer ([place])  skin tag rt breast  . Cellulitis 06/01/2014   RT INNER THIGH  . Depression    hx  . Diabetes ([place])   . Diabetes mellitus    IDDM, Insulin pump; followed by Dr. [name]  . GERD (gastroesophageal reflux disease)    no current meds.  . Heart murmur   . Hidradenitis 04/2012   bilat. thighs, left groin - open areas on thighs  . Hx MRSA  infection   . Hydradenitis   . PCOS (polycystic ovarian syndrome)   . Pneumonia    hx  . SVT (supraventricular tachycardia) ([place]) 06/2017  . Vitamin D insufficiency     Past Surgical History:  Procedure Laterality Date  . BREAST EXCISIONAL BIOPSY Right   . BREAST SURGERY     right lumpectomy  . BUNIONECTOMY     left  . CARDIAC CATHETERIZATION    . CHOLECYSTECTOMY  12/02/2006   lap. chole.  Marland Kitchen DILATION AND EVACUATION  02/14/2007  . ESOPHAGOGASTRODUODENOSCOPY  11/17/2011   Procedure: ESOPHAGOGASTRODUODENOSCOPY (EGD);  Surgeon: [name], MD;  Location: [facility];  Service: Endoscopy;  Laterality: N/A;  . EYE SURGERY     exc. stye left eye  . HYDRADENITIS EXCISION  04/30/2012   Procedure: EXCISION HYDRADENITIS GROIN;  Surgeon: [name], MD;  Location: [place];  Service: General;  Laterality: Left;  wide excision hidradenitis bilateral thighs and Left groin  . HYDRADENITIS EXCISION Left 01/13/2014   Procedure: WIDE EXCISION HIDRADENITIS LEFT AXILLA;  Surgeon: [name], MD;  Location: [place];  Service: General;  Laterality: Left;  . INCISION AND DRAINAGE ABSCESS Right 03/17/2019   Procedure: INCISION AND DRAINAGE RIGHT THIGH ABSCESS;  Surgeon: [name], MD;  Location: [place];  Service: General;  Laterality: Right;  . IRRIGATION AND DEBRIDEMENT ABSCESS Right 06/02/2014   Procedure: IRRIGATION AND DEBRIDEMENT ABSCESS;  Surgeon: [name], MD;  Location: [place];  Service: General;  Laterality: Right;  . IRRIGATION AND DEBRIDEMENT ABSCESS Left 09/23/2014   Procedure: IRRIGATION AND DEBRIDEMENT ABSCESS/LEFT THIGH;  Surgeon: [name], MD;  Location: [place];  Service: General;  Laterality: Left;  . LEFT HEART CATHETERIZATION WITH CORONARY ANGIOGRAM N/A 07/14/2014   Procedure: LEFT HEART CATHETERIZATION WITH CORONARY ANGIOGRAM;  Surgeon: [name], MD;  Location: [facility]ATH LAB;  Service: Cardiovascular;  Laterality: N/A;  .  TONSILLECTOMY    . WISDOM TOOTH EXTRACTION      Family Psychiatric History: Reviewed.  Family History:  Family History  Problem Relation Age of Onset  . Cancer Mother   . Anxiety disorder Mother   . Depression Mother   . Sexual abuse Mother   . Breast cancer Mother 33  . Hyperlipidemia Sister   . Hypertension Sister   . Sexual abuse Sister   . Hypertension Father   . Anxiety disorder Father   . Depression Father   . Drug abuse Father   . Stroke Paternal Uncle   . ADD / ADHD Paternal Uncle   . Anxiety disorder Paternal Uncle   . Anxiety disorder Maternal Aunt   . Seizures Maternal Aunt   . Anxiety disorder Paternal Aunt   . Anxiety disorder Maternal Uncle   . ADD / ADHD Cousin   . ADD / ADHD Daughter     Social History:  Social History   Socioeconomic History  . Marital status: Single    Spouse name: Not on file  . Number of children: Not on file  . Years of  education: Not on file  . Highest education level: Not on file  Occupational History  . Not on file  Tobacco Use  . Smoking status: Current Every Day Smoker    Packs/day: 1.00    Years: 23.00    Pack years: 23.00    Types: Cigarettes  . Smokeless tobacco: Never Used  . Tobacco comment: Has cut back to 1/2 to 3/4 and wants to begin useing patches again once anxiety subsides  Vaping Use  . Vaping Use: Never used  Substance and Sexual Activity  . Alcohol use: No    Alcohol/week: 0.0 standard drinks  . Drug use: Yes    Types: Marijuana    Comment: Last use 3 weeks prior   . Sexual activity: Yes    Partners: Male    Birth control/protection: I.U.D.  Other Topics Concern  . Not on file  Social History Narrative  . Not on file   Social Determinants of Health   Financial Resource Strain: Not on file  Food Insecurity: Not on file  Transportation Needs: Not on file  Physical Activity: Not on file  Stress: Not on file  Social Connections: Not on file    Allergies:  Allergies  Allergen Reactions   . Latex Hives, Shortness Of Breath and Rash  . Levofloxacin Shortness Of Breath and Rash  . Moxifloxacin Shortness Of Breath and Rash  . Oxycodone-Acetaminophen Shortness Of Breath, Swelling and Rash    NORCO/VICODIN OK  . Peach [Prunus Persica] Hives and Shortness Of Breath  . Potassium-Containing Compounds Other (See Comments)    IV ROUTE - CAUSES VEINS TO COLLAPS; Reports that it is undiluted K only  . Prednisone Other (See Comments)    SEVERE ELEVATION OF BLOOD SUGAR. Able to tolerate 40 mg  . Propoxyphene N-Acetaminophen Swelling    SWELLING OF FACE AND THROAT  . Rosiglitazone Maleate Swelling    SWELLING OF FACE AND LEGS  . Xolair [Omalizumab] Other (See Comments)    Rash and anaphylaxis  . Adhesive [Tape] Hives, Itching and Rash  . Morphine And Related Other (See Comments)    Causes hallucinations  . Prozac [Fluoxetine Hcl] Other (See Comments)    Made her very aggressive   . Chantix [Varenicline]     dreams  . Citrullus Vulgaris Nausea And Vomiting    Facial swelling  . Clindamycin/Lincomycin   . Humira [Adalimumab]     Does not remember   . Septra [Sulfamethoxazole-Trimethoprim]   . Zyvox [Linezolid]     Edema   . Cefaclor Rash  . Keflex [Cephalexin] Diarrhea and Rash    REACTION: severe migraine  . Promethazine Hcl Other (See Comments)    IV ROUTE ONLY - JITTERY FEELING. Patient reports that it is mild and she has used promethazine since then  PO tablet ok  . Sulfadiazine Rash    Metabolic Disorder Labs: Lab Results  Component Value Date   HGBA1C 10.6 (H) 10/04/2018   MPG 280.48 12/25/2017   MPG 246 (H) 06/01/2014   No results found for: PROLACTIN Lab Results  Component Value Date   CHOL 193 03/22/2018   TRIG 90.0 03/22/2018   HDL 62.80 03/22/2018   CHOLHDL 3 03/22/2018   VLDL 18.0 03/22/2018   LDLCALC 113 (H) 03/22/2018   LDLCALC 88 12/19/2015   Lab Results  Component Value Date   TSH 1.43 03/22/2018   TSH 0.773 12/25/2017    Therapeutic  Level Labs: No results found for: LITHIUM No results found for: VALPROATE No  components found for:  CBMZ  Current Medications: Current Outpatient Medications  Medication Sig Dispense Refill  . acetaminophen (TYLENOL) 500 MG tablet Take 2 tablets (1,000 mg total) by mouth every 6 (six) hours as needed for mild pain or fever.    Marland Kitchen albuterol (PROVENTIL) (2.5 MG/3ML) 0.083% nebulizer solution Take 3 mLs (2.5 mg total) by nebulization every 6 (six) hours as needed for wheezing or shortness of breath. 75 mL 12  . azithromycin (ZITHROMAX) 250 MG tablet 2 today then one daily 6 tablet 0  . clonazePAM (KLONOPIN) 0.5 MG tablet Take 1 tablet (0.5 mg total) by mouth 2 (two) times daily as needed for anxiety. 60 tablet 2  . doxycycline (VIBRA-TABS) 100 MG tablet TAKE 1 TABLET BY MOUTH 2 TIMES DAILY. (Patient taking differently: Take 100 mg by mouth 2 (two) times daily. ) 14 tablet prn  . Eszopiclone 3 MG TABS Take 1 tablet (3 mg total) by mouth at bedtime as needed. Take immediately before bedtime 15 tablet 2  . glucose blood ([employer] TEST STRIPS) test strip 1 each by Other route See admin instructions.     Marland Kitchen HUMALOG 100 UNIT/ML injection 20-22 units pre-meal as per sliding scale 10 mL 11  . insulin glargine (LANTUS) 100 UNIT/ML injection INJECT 60 UNITS UNDER THE SKIN ONCE DAILY AS DIRECTED 60 mL 3  . lamoTRIgine (LAMICTAL) 200 MG tablet Take 1 tablet (200 mg total) by mouth daily. 30 tablet 2  . levonorgestrel (MIRENA) 20 MCG/24HR IUD 1 each by Intrauterine route once. Placed 04/2013    . mupirocin ointment (BACTROBAN) 2 % Apply 1 application topically daily as needed.  2  . ondansetron (ZOFRAN ODT) 8 MG disintegrating tablet Take 1 tablet (8 mg total) by mouth every 8 (eight) hours as needed for nausea or vomiting. 20 tablet 1  . pantoprazole (PROTONIX) 40 MG tablet Take 1 tablet (40 mg total) by mouth daily. 90 tablet 1  . prazosin (MINIPRESS) 2 MG capsule Take 1 capsule (2 mg total) by mouth at bedtime.  90 capsule 0  . spironolactone (ALDACTONE) 100 MG tablet Take 1 tablet (100 mg total) by mouth daily. 30 tablet 3  . temazepam (RESTORIL) 30 MG capsule Take 1 capsule (30 mg total) by mouth at bedtime as needed for sleep. 30 capsule 0  . traMADol (ULTRAM) 50 MG tablet Take 1 tablet (50 mg total) by mouth every 6 (six) hours as needed for moderate pain. 30 tablet 0  . Vilazodone HCl 20 MG TABS Take 1 tablet (20 mg total) by mouth daily. 30 tablet 2   No current facility-administered medications for this visit.      Psychiatric Specialty Exam: please take into account limitations in obtaining a full MSE in the context of phone communication Review of Systems  Psychiatric/Behavioral: Positive for sleep disturbance.  All other systems reviewed and are negative.   There were no vitals taken for this visit.There is no height or weight on file to calculate BMI.  General Appearance: NA  Eye Contact:  NA  Speech:  Normal   Volume:  Normal  Mood:  Reports mood as "OK", does endorse some subjective sense of affective lability, but denies worsening depression and affect appears reactive/ full in range  Affect:  As above   Thought Process:  Goal Directed and Linear  Orientation:  Full (Time, Place, and Person)  Thought Content: linear   Suicidal Thoughts:  No- denies suicidal ideations, and focused on pregnancy and health of her child at  this time.   Homicidal Thoughts:  No  Memory:  Recent and remote grossly intact   Judgement:  Present   Insight:  Present   Psychomotor Activity:  NA  Concentration:  Grossly intact    Recall:  Good  Fund of Knowledge: Good  Language: Good  Akathisia:  Negative  Handed:  Right  AIMS (if indicated): not done  Assets:  Communication Skills Desire for Improvement Financial Resources/Insurance Housing Social Support Talents/Skills  ADL's:  Intact  Cognition: WNL  Sleep:  Fair   Screenings: GAD-7   [place] Office Visit from 04/04/2019 in [place] Counselor from 12/14/2018 in [place] Counselor from 11/24/2018 in [place]  Total GAD-7 Score 15 17 19     PHQ2-9   [place] Office Visit from 04/04/2019 in [place] Counselor from 12/14/2018 in [facility] Counselor from 11/24/2018 in [place] Patient Outreach from 03/14/2015 in [place]  PHQ-2 Total Score 4 4 6 1   PHQ-9 Total Score 16 19 24  --       Assessment and Plan: 40yo divorced (but soon to be married) femalewith hx of PTSD, bipolar 2 disorder andGAD/panic disorder.   HY:WVPXTGG 2 disorder; Panic disorder/GAD/PTSD; Hx of ADHD dx.  Reports pregnancy going well thus far , currently at  20 weeks . Has established prenatal care and had recent obstetric sonogram . She is currently not on any standing psychiatric medications, which she stopped upon finding out she was pregnant . States she has been doing well, and denies worsening depression, denies SI, is functioning well in daily activities . She does endorse some anxiety and a subjective sense of affective lability, although described as mild . She is interested in exploring having the option to restart psychiatric medications for mood and anxiety if needed later on during pregnancy .  Her most recent medication regimen included Lamotrigine, Viibryd, Klonopin . Of note, patient has IDDM .  We reviewed medication options in the context of side effect profiles and potential risk versus benefit scale in the context of pregnancy. Lamotrigine may be a relatively safe option during pregnancy and it is a medication she has already been on and tolerated well . Would prefer to manage insomnia or anxiety with Benadryl or similar rather than Klonopin. Insofar as antidepressant , would consider Zoloft trial .   For now, patient preference is to further review these options with her OB, whom she sees next week for prenatal care visit , and will contact me as needed .  No medications prescribed at this time. Will see in 3-4 weeks .  She agrees to contact clinic promptly if any worsening prior and to immediately go to ED if any severe worsening or emergence of SI.  Continue individual psychotherapy  Continue individual psychotherapy .   Jenne Campus, MD 02/19/2021, 3:20 PM

## 2021-02-20 ENCOUNTER — Other Ambulatory Visit: Payer: Self-pay

## 2021-02-20 ENCOUNTER — Ambulatory Visit (INDEPENDENT_AMBULATORY_CARE_PROVIDER_SITE_OTHER): Payer: 59 | Admitting: Psychiatry

## 2021-02-20 DIAGNOSIS — F4310 Post-traumatic stress disorder, unspecified: Secondary | ICD-10-CM

## 2021-02-20 DIAGNOSIS — F3181 Bipolar II disorder: Secondary | ICD-10-CM | POA: Diagnosis not present

## 2021-02-20 DIAGNOSIS — F3341 Major depressive disorder, recurrent, in partial remission: Secondary | ICD-10-CM | POA: Diagnosis not present
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
## 2021-02-22 ENCOUNTER — Encounter (HOSPITAL_COMMUNITY): Payer: Self-pay | Admitting: Psychiatry

## 2021-02-26 ENCOUNTER — Inpatient Hospital Stay (HOSPITAL_COMMUNITY): Admission: AD | Admit: 2021-02-26 | Payer: 59 | Source: Home / Self Care | Admitting: Obstetrics and Gynecology

## 2021-03-04 ENCOUNTER — Ambulatory Visit (HOSPITAL_COMMUNITY): Payer: 59 | Admitting: Psychiatry

## 2021-03-12 ENCOUNTER — Ambulatory Visit (HOSPITAL_COMMUNITY): Payer: 59 | Admitting: Psychiatry

## 2021-03-12 ENCOUNTER — Other Ambulatory Visit: Payer: Self-pay

## 2021-03-12 DIAGNOSIS — F3181 Bipolar II disorder: Secondary | ICD-10-CM

## 2021-03-14 ENCOUNTER — Encounter (HOSPITAL_COMMUNITY): Payer: Self-pay | Admitting: Psychiatry

## 2021-03-14 NOTE — Progress Notes (Signed)
Client contacted to reschedule, as she had an emergency appointment with OBGYN regarding the health of her pregnancy. Counselor and Client communicated to find an alternative time, but were unable. Counselor rescheduled for next week.  Lise Auer, LCSW

## 2021-03-22 ENCOUNTER — Other Ambulatory Visit (HOSPITAL_COMMUNITY): Payer: Self-pay | Admitting: Psychiatry

## 2021-03-22 MED ORDER — SERTRALINE HCL 25 MG PO TABS: 25.0000 mg | ORAL_TABLET | Freq: Every day | ORAL | 1 refills | Status: DC

## 2021-03-22 NOTE — Progress Notes (Signed)
03/22/2021  11,40 AM Phone documentation   Patient has contacted clinic, reports she has been feeling more depressed recently and wants to consider restarting psychiatric medications . Reports she spoke with her Ob/ GYn whom she is following regularly for prenatal care and that she was told it was felt safe to start Zoloft and also consider restarting Lamotrigine .  Spoke with her via phone- reports pregnancy going well- seeing Md for prenatal care regularly and taking prenatal vitamins .  Reports some increased depression, denies suicidal or self injurious ideations and presents future oriented looking forward to having baby. No psychotic symptoms. We discussed medication options. Reviewed Zoloft risk/benefit issues and potential risks associated with pregnancy , particularly in third trimester, perinatally .  Agreed to initiate Zoloft at 25 mgr QDAY initially, which will be called in to pharmacy . Appt in 1-2 weeks , but agrees to contact clinic or go to ED if any concerns or worsening prior.   Gabriel Earing MD

## 2021-04-02 ENCOUNTER — Encounter (HOSPITAL_COMMUNITY): Payer: Self-pay | Admitting: Psychiatry

## 2021-04-02 ENCOUNTER — Other Ambulatory Visit: Payer: Self-pay

## 2021-04-02 ENCOUNTER — Telehealth (INDEPENDENT_AMBULATORY_CARE_PROVIDER_SITE_OTHER): Payer: 59 | Admitting: Psychiatry

## 2021-04-02 DIAGNOSIS — F3181 Bipolar II disorder: Secondary | ICD-10-CM

## 2021-04-02 MED ORDER — SERTRALINE HCL 25 MG PO TABS: 25.0000 mg | ORAL_TABLET | Freq: Every day | ORAL | 1 refills | Status: DC

## 2021-04-02 MED ORDER — LAMOTRIGINE 25 MG PO TABS: 25.0000 mg | ORAL_TABLET | Freq: Every day | ORAL | 1 refills | Status: DC

## 2021-04-02 NOTE — Progress Notes (Signed)
[facility] MD/PA/NP OP Progress Note  04/02/2021 12:28 PM [name]  MRN:  [id number] Interview was conducted by phone and I verified that I was speaking with the correct person using two identifiers. I discussed the limitations of evaluation and management by telemedicine and  the availability of in person appointments. Patient expressed understanding and agreed to proceed. Participants in the visit: patient (location - home); physician (location - home office).Duration 20 minutes   Chief Complaint:  returns for medication management   HPI: 40 yo divorced female with hx of PTSD, bipolar 2 disorder and GAD/panic disorder.    She is currently on her 25th week of pregnancy.  Reports that pregnancy is going well.  She is following with prenatal care and has an established OB/GYN specialist.She reports she is taking prenatal vitamin regularly .  She had stopped her psychiatric medications on finding out that she was pregnant .  However, over recent weeks she has been experiencing increased mood symptoms, mainly described as a vague but persistent sense of depression and also some irritability.  States she has noticed being more abrupt and short with her husband and family members.  Denies any actual violent behaviors or ideations. She denies having any suicidal ideations or any psychotic symptoms and remains future oriented and focused on having her child.  She spoke with her OB/GYN about this as well as with Probation officer and options/risks versus benefits were considered.  Was started on Zoloft 25 mg daily initially.  Potential risks, including rare risk of neonatal  pulmonary hypertension reviewed.  Thus far Zoloft well-tolerated (started about a week ago) without reported side effects or any worsening symptoms.  She is also interested in restarting lamotrigine at  this time she  was on this medication prior to pregnancy (at the time taking along with Viibryd).  She was tolerating this medication well without  side effects.  She felt it was effective, with significantly improved mood.  She spoke with her OB specialist and reports that was told could consider resuming lamotrigine during pregnancy.  Lamotrigine has a relatively favorable risk profile for use in pregnancy, and patient has a history of tolerating this medication well before pregnancy with good response.  Risks were reviewed.  We also reviewed the potential risk of severe rash/Stevens-Johnson syndrome                  Visit Diagnosis:  No diagnosis found.  Past Psychiatric History: Please see intake H&P.  Past Medical History:  Past Medical History:  Diagnosis Date   Abscess of left axilla - PREVOTELLA BIVIA & Staph Coag Neg 07/12/2012   MODERATE PREVOTELLA BIVIA Note: BETA LACTAMASE NEGATIVE     Asthma    as a child   Cancer ([place])    skin tag rt breast   Cellulitis 06/01/2014   RT INNER THIGH   Depression    hx   Diabetes ([place])    Diabetes mellitus    IDDM, Insulin pump; followed by Dr. [name]   GERD (gastroesophageal reflux disease)    no current meds.   Heart murmur    Hidradenitis 04/2012   bilat. thighs, left groin - open areas on thighs   Hx MRSA infection    Hydradenitis    PCOS (polycystic ovarian syndrome)    Pneumonia    hx   SVT (supraventricular tachycardia) ([place]) 06/2017   Vitamin D insufficiency     Past Surgical History:  Procedure Laterality Date   BREAST EXCISIONAL BIOPSY  Right    BREAST SURGERY     right lumpectomy   BUNIONECTOMY     left   CARDIAC CATHETERIZATION     CHOLECYSTECTOMY  12/02/2006   lap. chole.   DILATION AND EVACUATION  02/14/2007   ESOPHAGOGASTRODUODENOSCOPY  11/17/2011   Procedure: ESOPHAGOGASTRODUODENOSCOPY (EGD);  Surgeon: [name], MD;  Location: [facility];  Service: Endoscopy;  Laterality: N/A;   EYE SURGERY     exc. stye left eye   HYDRADENITIS EXCISION  04/30/2012   Procedure: EXCISION HYDRADENITIS GROIN;  Surgeon: [name], MD;   Location: [facility];  Service: General;  Laterality: Left;  wide excision hidradenitis bilateral thighs and Left groin   HYDRADENITIS EXCISION Left 01/13/2014   Procedure: WIDE EXCISION HIDRADENITIS LEFT AXILLA;  Surgeon: [name], MD;  Location: [facility];  Service: General;  Laterality: Left;   INCISION AND DRAINAGE ABSCESS Right 03/17/2019   Procedure: INCISION AND DRAINAGE RIGHT THIGH ABSCESS;  Surgeon: [name], MD;  Location: [facility];  Service: General;  Laterality: Right;   IRRIGATION AND DEBRIDEMENT ABSCESS Right 06/02/2014   Procedure: IRRIGATION AND DEBRIDEMENT ABSCESS;  Surgeon: [name], MD;  Location: [facility];  Service: General;  Laterality: Right;   IRRIGATION AND DEBRIDEMENT ABSCESS Left 09/23/2014   Procedure: IRRIGATION AND DEBRIDEMENT ABSCESS/LEFT THIGH;  Surgeon: [name], MD;  Location: [facility];  Service: General;  Laterality: Left;   LEFT HEART CATHETERIZATION WITH CORONARY ANGIOGRAM N/A 07/14/2014   Procedure: LEFT HEART CATHETERIZATION WITH CORONARY ANGIOGRAM;  Surgeon: [name], MD;  Location: [facility] CATH LAB;  Service: Cardiovascular;  Laterality: N/A;   TONSILLECTOMY     WISDOM TOOTH EXTRACTION      Family Psychiatric History: Reviewed.  Family History:  Family History  Problem Relation Age of Onset   Cancer Mother    Anxiety disorder Mother    Depression Mother    Sexual abuse Mother    Breast cancer Mother 68   Hyperlipidemia Sister    Hypertension Sister    Sexual abuse Sister    Hypertension Father    Anxiety disorder Father    Depression Father    Drug abuse Father    Stroke Paternal Uncle    ADD / ADHD Paternal Uncle    Anxiety disorder Paternal Uncle    Anxiety disorder Maternal Aunt    Seizures Maternal Aunt    Anxiety disorder Paternal Aunt    Anxiety disorder Maternal Uncle    ADD / ADHD Cousin    ADD / ADHD Daughter     Social History:  Social History   Socioeconomic History   Marital status: Single     Spouse name: Not on file   Number of children: Not on file   Years of education: Not on file   Highest education level: Not on file  Occupational History   Not on file  Tobacco Use   Smoking status: Every Day    Packs/day: 1.00    Years: 23.00    Pack years: 23.00    Types: Cigarettes   Smokeless tobacco: Never   Tobacco comments:    Has cut back to 1/2 to 3/4 and wants to begin useing patches again once anxiety subsides  Vaping Use   Vaping Use: Never used  Substance and Sexual Activity   Alcohol use: No    Alcohol/week: 0.0 standard drinks   Drug use: Yes    Types: Marijuana    Comment: Last use 3 weeks prior  Sexual activity: Yes    Partners: Male    Birth control/protection: I.U.D.  Other Topics Concern   Not on file  Social History Narrative   Not on file   Social Determinants of Health   Financial Resource Strain: Not on file  Food Insecurity: Not on file  Transportation Needs: Not on file  Physical Activity: Not on file  Stress: Not on file  Social Connections: Not on file    Allergies:  Allergies  Allergen Reactions   Latex Hives, Shortness Of Breath and Rash   Levofloxacin Shortness Of Breath and Rash   Moxifloxacin Shortness Of Breath and Rash   Oxycodone-Acetaminophen Shortness Of Breath, Swelling and Rash    NORCO/VICODIN OK   Peach [Prunus Persica] Hives and Shortness Of Breath   Potassium-Containing Compounds Other (See Comments)    IV ROUTE - CAUSES VEINS TO COLLAPS; Reports that it is undiluted K only   Prednisone Other (See Comments)    SEVERE ELEVATION OF BLOOD SUGAR. Able to tolerate 40 mg   Propoxyphene N-Acetaminophen Swelling    SWELLING OF FACE AND THROAT   Rosiglitazone Maleate Swelling    SWELLING OF FACE AND LEGS   Xolair [Omalizumab] Other (See Comments)    Rash and anaphylaxis   Adhesive [Tape] Hives, Itching and Rash   Morphine And Related Other (See Comments)    Causes hallucinations   Prozac [Fluoxetine Hcl] Other  (See Comments)    Made her very aggressive    Chantix [Varenicline]     dreams   Citrullus Vulgaris Nausea And Vomiting    Facial swelling   Clindamycin/Lincomycin    Humira [Adalimumab]     Does not remember    Septra [Sulfamethoxazole-Trimethoprim]    Zyvox [Linezolid]     Edema    Cefaclor Rash   Keflex [Cephalexin] Diarrhea and Rash    REACTION: severe migraine   Promethazine Hcl Other (See Comments)    IV ROUTE ONLY - JITTERY FEELING. Patient reports that it is mild and she has used promethazine since then  PO tablet ok   Sulfadiazine Rash    Metabolic Disorder Labs: Lab Results  Component Value Date   HGBA1C 10.6 (H) 10/04/2018   MPG 280.48 12/25/2017   MPG 246 (H) 06/01/2014   No results found for: PROLACTIN Lab Results  Component Value Date   CHOL 193 03/22/2018   TRIG 90.0 03/22/2018   HDL 62.80 03/22/2018   CHOLHDL 3 03/22/2018   VLDL 18.0 03/22/2018   LDLCALC 113 (H) 03/22/2018   LDLCALC 88 12/19/2015   Lab Results  Component Value Date   TSH 1.43 03/22/2018   TSH 0.773 12/25/2017    Therapeutic Level Labs: No results found for: LITHIUM No results found for: VALPROATE No components found for:  CBMZ  Current Medications: Current Outpatient Medications  Medication Sig Dispense Refill   acetaminophen (TYLENOL) 500 MG tablet Take 2 tablets (1,000 mg total) by mouth every 6 (six) hours as needed for mild pain or fever.     albuterol (PROVENTIL) (2.5 MG/3ML) 0.083% nebulizer solution Take 3 mLs (2.5 mg total) by nebulization every 6 (six) hours as needed for wheezing or shortness of breath. 75 mL 12   azithromycin (ZITHROMAX) 250 MG tablet 2 today then one daily 6 tablet 0   doxycycline (VIBRA-TABS) 100 MG tablet TAKE 1 TABLET BY MOUTH 2 TIMES DAILY. (Patient taking differently: Take 100 mg by mouth 2 (two) times daily. ) 14 tablet prn   glucose blood ([employer] TEST  STRIPS) test strip 1 each by Other route See admin instructions.      HUMALOG 100 UNIT/ML  injection 20-22 units pre-meal as per sliding scale 10 mL 11   insulin glargine (LANTUS) 100 UNIT/ML injection INJECT 60 UNITS UNDER THE SKIN ONCE DAILY AS DIRECTED 60 mL 3   levonorgestrel (MIRENA) 20 MCG/24HR IUD 1 each by Intrauterine route once. Placed 04/2013     mupirocin ointment (BACTROBAN) 2 % Apply 1 application topically daily as needed.  2   ondansetron (ZOFRAN ODT) 8 MG disintegrating tablet Take 1 tablet (8 mg total) by mouth every 8 (eight) hours as needed for nausea or vomiting. 20 tablet 1   pantoprazole (PROTONIX) 40 MG tablet Take 1 tablet (40 mg total) by mouth daily. 90 tablet 1   sertraline (ZOLOFT) 25 MG tablet Take 1 tablet (25 mg total) by mouth daily. 15 tablet 1   spironolactone (ALDACTONE) 100 MG tablet Take 1 tablet (100 mg total) by mouth daily. 30 tablet 3   traMADol (ULTRAM) 50 MG tablet Take 1 tablet (50 mg total) by mouth every 6 (six) hours as needed for moderate pain. 30 tablet 0   No current facility-administered medications for this visit.      Psychiatric Specialty Exam: please take into account limitations in obtaining a full MSE in the context of phone communication Review of Systems  Psychiatric/Behavioral:  Positive for sleep disturbance.   All other systems reviewed and are negative.  There were no vitals taken for this visit.There is no height or weight on file to calculate BMI.  General Appearance: NA  Eye Contact:  NA  Speech:  Normal   Volume:  Normal  Mood:  Reports she is "okay" but does report that over recent weeks she has felt more "moody", vaguely depressed and irritable  Affect:  As above -currently affect appears appropriate, not overtly irritable or expansive  Thought Process:  Goal Directed and Linear  Orientation:  Full (Time, Place, and Person)  Thought Content: linear   Suicidal Thoughts:  No- denies suicidal ideations, and remains focused on healthy pregnancy and birth  Homicidal Thoughts:  No  Memory:  Recent and remote  grossly intact   Judgement:  Present   Insight:  Present   Psychomotor Activity:  NA  Concentration:  Grossly intact    Recall:  Good  Fund of Knowledge: Good  Language: Good  Akathisia:  Negative  Handed:  Right  AIMS (if indicated): not done  Assets:  Communication Skills Desire for Improvement Financial Resources/Insurance Housing Social Support Talents/Skills  ADL's:  Intact  Cognition: WNL  Sleep:  Fair   Screenings: GAD-7    [facility] Office Visit from 04/04/2019 in [facility] Counselor from 12/14/2018 in [facility] Counselor from 11/24/2018 in [facility]  Total GAD-7 Score 15 17 19       PHQ2-9    [place] Office Visit from 04/04/2019 in [facility] Counselor from 12/14/2018 in [facility] Counselor from 11/24/2018 in [facility] Patient Outreach from 03/14/2015 in [facility]  PHQ-2 Total Score 4 4 6 1   PHQ-9 Total Score 16 19 24  --        Assessment and Plan: 40 yo divorced (but soon to be married) female with hx of PTSD, bipolar 2 disorder and GAD/panic disorder.    Dx: Bipolar 2 disorder; Panic disorder/GAD/PTSD; Hx of ADHD  dx.  Currently [redacted] weeks pregnant.  Reports pregnancy going well.  Following regularly be with OB/GYN for prenatal care, taking prenatal vitamin.  Had been off psychiatric medications throughout most of pregnancy but recently has noticed some increased mood symptoms.  These are reported as mainly subjective.  She has felt depressed and "moody", "irritable", and describes she feels has been more irritable with her husband.  No overt manic symptoms noted or reported.  No pressured speech.  No expansive affect.  No grandiosity.  Sleeping well.  No report of reckless or dangerous behaviors.  No psychotic symptoms noted or  reported.  She has decided to resume psychiatric medications.  She recently was started on Zoloft which she is now taking at 25 mg daily.  She is wanting to resume lamotrigine as well which she identifies as an effective and well-tolerated medication which she was taking prior to her pregnancy.  Side effects and risk/benefit considerations in the context of pregnancy have been reviewed for both of these medications.   Continue Zoloft 25 mg daily Start Lamotrigine 25 mg daily, may increase to 25 mg twice daily in 10 days.  Side effects reviewed Continue individual psychotherapy We will see patient in 3 to 4 weeks.  She agrees to contact clinic sooner should there be any worsening or concern prior   Jenne Campus, MD 04/02/2021, 12:28 PM

## 2021-04-03 ENCOUNTER — Ambulatory Visit (INDEPENDENT_AMBULATORY_CARE_PROVIDER_SITE_OTHER): Payer: 59 | Admitting: Psychiatry

## 2021-04-03 ENCOUNTER — Other Ambulatory Visit: Payer: Self-pay

## 2021-04-03 DIAGNOSIS — F3181 Bipolar II disorder: Secondary | ICD-10-CM

## 2021-04-03 DIAGNOSIS — F4310 Post-traumatic stress disorder, unspecified: Secondary | ICD-10-CM

## 2021-04-03 NOTE — Progress Notes (Signed)
Virtual Visit via Video Note  I connected with [name] on 04/03/21 at  2:30 PM EDT by a video enabled telemedicine application and verified that I am speaking with the correct person using two identifiers.  History of Present Illness: Bipolar 2 DO, Major Depressive type, PTSD   Treatment Plan Goals: 1) [name] would like to implement more self-care practices into life to reduce stress and anxiety.  2) [name] would like to process and applying grounding and mindfulness practices when experiencing trauma triggers and reminders.    Observations/Objective: Counselor met with Client for individual therapy via Webex. Counselor assessed MH symptoms and progress on treatment plan goals, with patient reporting on close friend passing unexpectedly and her involvement in his funeral proceedings. Client shared about her grieving process and how she is proactively implementing boundaries, as she anticipates triggers from others at ceremonies. Counselor praised Client for intentional efforts to reduce and prevent stressors, thinking ahead and communicating needs. Client presents with mild depression and mild anxiety. Client denied suicidal ideation or self-harm behaviors.    Goal 1 and 2: Counselor used MI and CBT interventions to process thoughts and feelings related to the Client's named stressors of work, parenting, partnering and extended family. Counselor assessed application of self-care practices and mindfulness. Client reports the practice of listening to her body, as to not over do it for the health of her pregnancy. Client is attending appointments and following doctors recommendations for self-care. Client acquired a cat and is enjoying its company and comfort. Counselor and Client ended session with discussing implementation of grounding skills at home and in community when she is becoming overwhelmed. Client feels confident in being able to utilize skills and shared that she is teaching husband and  daughter as well, with positive effects and outcomes.    Assessment and Plan: Counselor will continue to meet with patient to address treatment plan goals. Patient will continue to follow recommendations of providers and implement skills learned in session.   Follow Up Instructions: Counselor will send information for next session via Webex.   The patient was advised to call back or seek an in-person evaluation if the symptoms worsen or if the condition fails to improve as anticipated.   I provided 30 minutes of non-face-to-face time during this encounter.     Barbara Auer, LCSW

## 2021-04-04 ENCOUNTER — Encounter (HOSPITAL_COMMUNITY): Payer: Self-pay | Admitting: Psychiatry

## 2021-04-15 ENCOUNTER — Telehealth (HOSPITAL_COMMUNITY): Payer: 59 | Admitting: Psychiatry

## 2021-04-18 ENCOUNTER — Other Ambulatory Visit: Payer: Self-pay

## 2021-04-18 ENCOUNTER — Ambulatory Visit (INDEPENDENT_AMBULATORY_CARE_PROVIDER_SITE_OTHER): Payer: 59 | Admitting: Psychiatry

## 2021-04-18 ENCOUNTER — Encounter (HOSPITAL_COMMUNITY): Payer: Self-pay | Admitting: Psychiatry

## 2021-04-18 DIAGNOSIS — F3181 Bipolar II disorder: Secondary | ICD-10-CM

## 2021-04-18 DIAGNOSIS — F4310 Post-traumatic stress disorder, unspecified: Secondary | ICD-10-CM

## 2021-04-18 NOTE — Progress Notes (Signed)
Virtual Visit via Video Note  I connected with [name] on 04/18/21 at  2:30 PM EDT by a video enabled telemedicine application and verified that I am speaking with the correct person using two identifiers.  Location: Patient: Home Provider: Home Office   I discussed the limitations of evaluation and management by telemedicine and the availability of in person appointments. The patient expressed understanding and agreed to proceed. History of Present Illness: Bipolar 2 DO, Major Depressive type, PTSD   Treatment Plan Goals: 1) [name] would like to implement more self-care practices into life to reduce stress and anxiety.  2) [name] would like to process and applying grounding and mindfulness practices when experiencing trauma triggers and reminders.    Observations/Objective: Counselor met with [name] for individual therapy via Webex. Counselor assessed MH symptoms and progress on treatment plan goals, with patient reporting on current stress levels and how it is impacting family life and pregnancy. [name] presents with mild depression and moderate anxiety. [name] denied suicidal ideation or self-harm behaviors.    Goal 1 and 2: Counselor used MI and CBT interventions to process thoughts and feelings related to the [name]'s named stressors of work, parenting, partnering and extended family. Counselor assessed application of self-care practices and mindfulness. [name] reports new medical issues and low energy levels which have been causing increased agitation and irritability. [name] states that [name] is engaging in deep breathing and paced breathing techniques daily when agitated to reduce tension and prevent external outburts. [name] is feeling overwhelmed with maintaining household and daily tasks. Counselor assessed ability to engage support system and communicating needs. [name] verbalized willingness to discuss with family and friends and ask for help. [name] is preparing for back to  school transition with [name] and working to meet daughters mental and emotional health needs before delivery of baby. Counselor reviewed coping strategies and expressed encouragement in the upcoming transitions.    Assessment and Plan: Counselor will continue to meet with patient to address treatment plan goals. Patient will continue to follow recommendations of providers and implement skills learned in session.   Follow Up Instructions: Counselor will send information for next session via Webex.   The patient was advised to call back or seek an in-person evaluation if the symptoms worsen or if the condition fails to improve as anticipated.   I provided 30 minutes of non-face-to-face time during this encounter.     Lise Auer, LCSW

## 2021-04-22 DIAGNOSIS — D6851 Activated protein C resistance: Secondary | ICD-10-CM

## 2021-04-22 HISTORY — DX: Activated protein C resistance: D68.51

## 2021-04-24 LAB — OB RESULTS CONSOLE HIV ANTIBODY (ROUTINE TESTING): HIV: NONREACTIVE

## 2021-04-24 LAB — OB RESULTS CONSOLE RPR: RPR: NONREACTIVE

## 2021-04-25 ENCOUNTER — Other Ambulatory Visit: Payer: Self-pay

## 2021-05-13 ENCOUNTER — Other Ambulatory Visit: Payer: Self-pay | Admitting: Obstetrics and Gynecology

## 2021-05-13 DIAGNOSIS — Z363 Encounter for antenatal screening for malformations: Secondary | ICD-10-CM

## 2021-05-14 ENCOUNTER — Other Ambulatory Visit: Payer: Self-pay

## 2021-05-14 ENCOUNTER — Ambulatory Visit: Payer: Medicaid Other | Attending: Obstetrics and Gynecology

## 2021-05-14 ENCOUNTER — Other Ambulatory Visit: Payer: Self-pay | Admitting: Obstetrics and Gynecology

## 2021-05-14 ENCOUNTER — Encounter: Payer: Self-pay | Admitting: *Deleted

## 2021-05-14 ENCOUNTER — Ambulatory Visit: Payer: Medicaid Other | Admitting: *Deleted

## 2021-05-14 VITALS — BP 119/78 | HR 87

## 2021-05-14 DIAGNOSIS — E669 Obesity, unspecified: Secondary | ICD-10-CM

## 2021-05-14 DIAGNOSIS — O35BXX0 Maternal care for other (suspected) fetal abnormality and damage, fetal cardiac anomalies, not applicable or unspecified: Secondary | ICD-10-CM

## 2021-05-14 DIAGNOSIS — O09213 Supervision of pregnancy with history of pre-term labor, third trimester: Secondary | ICD-10-CM

## 2021-05-14 DIAGNOSIS — E109 Type 1 diabetes mellitus without complications: Secondary | ICD-10-CM | POA: Diagnosis not present

## 2021-05-14 DIAGNOSIS — O24013 Pre-existing diabetes mellitus, type 1, in pregnancy, third trimester: Secondary | ICD-10-CM | POA: Diagnosis not present

## 2021-05-14 DIAGNOSIS — Z3A31 31 weeks gestation of pregnancy: Secondary | ICD-10-CM

## 2021-05-14 DIAGNOSIS — O09523 Supervision of elderly multigravida, third trimester: Secondary | ICD-10-CM

## 2021-05-14 DIAGNOSIS — Z363 Encounter for antenatal screening for malformations: Secondary | ICD-10-CM | POA: Insufficient documentation

## 2021-05-14 DIAGNOSIS — O99213 Obesity complicating pregnancy, third trimester: Secondary | ICD-10-CM

## 2021-05-14 DIAGNOSIS — O358XX0 Maternal care for other (suspected) fetal abnormality and damage, not applicable or unspecified: Secondary | ICD-10-CM

## 2021-05-14 DIAGNOSIS — O99210 Obesity complicating pregnancy, unspecified trimester: Secondary | ICD-10-CM

## 2021-05-14 DIAGNOSIS — O99323 Drug use complicating pregnancy, third trimester: Secondary | ICD-10-CM | POA: Diagnosis not present

## 2021-05-14 DIAGNOSIS — F1910 Other psychoactive substance abuse, uncomplicated: Secondary | ICD-10-CM

## 2021-05-24 ENCOUNTER — Encounter (HOSPITAL_COMMUNITY): Payer: Self-pay | Admitting: Obstetrics and Gynecology

## 2021-05-24 ENCOUNTER — Other Ambulatory Visit: Payer: Self-pay

## 2021-05-24 ENCOUNTER — Observation Stay (HOSPITAL_COMMUNITY)
Admission: AD | Admit: 2021-05-24 | Discharge: 2021-05-25 | Disposition: A | Discharge status: Home / Self Care | Payer: Medicaid Other | Attending: Obstetrics and Gynecology | Admitting: Obstetrics and Gynecology

## 2021-05-24 DIAGNOSIS — U071 COVID-19: Secondary | ICD-10-CM | POA: Insufficient documentation

## 2021-05-24 DIAGNOSIS — O99511 Diseases of the respiratory system complicating pregnancy, first trimester: Secondary | ICD-10-CM | POA: Insufficient documentation

## 2021-05-24 DIAGNOSIS — Z87891 Personal history of nicotine dependence: Secondary | ICD-10-CM | POA: Diagnosis not present

## 2021-05-24 DIAGNOSIS — O98511 Other viral diseases complicating pregnancy, first trimester: Secondary | ICD-10-CM | POA: Diagnosis not present

## 2021-05-24 DIAGNOSIS — Z853 Personal history of malignant neoplasm of breast: Secondary | ICD-10-CM | POA: Insufficient documentation

## 2021-05-24 DIAGNOSIS — Z3A12 12 weeks gestation of pregnancy: Secondary | ICD-10-CM | POA: Insufficient documentation

## 2021-05-24 DIAGNOSIS — J45909 Unspecified asthma, uncomplicated: Secondary | ICD-10-CM | POA: Diagnosis not present

## 2021-05-24 DIAGNOSIS — O24419 Gestational diabetes mellitus in pregnancy, unspecified control: Secondary | ICD-10-CM | POA: Diagnosis not present

## 2021-05-24 DIAGNOSIS — Z8673 Personal history of transient ischemic attack (TIA), and cerebral infarction without residual deficits: Secondary | ICD-10-CM | POA: Diagnosis not present

## 2021-05-24 DIAGNOSIS — O163 Unspecified maternal hypertension, third trimester: Secondary | ICD-10-CM | POA: Diagnosis present

## 2021-05-24 HISTORY — DX: Personal history of other venous thrombosis and embolism: Z86.718

## 2021-05-24 LAB — CBC
HCT: 39 % (ref 36.0–46.0)
Hemoglobin: 13.3 g/dL (ref 12.0–15.0)
MCH: 31.3 pg (ref 26.0–34.0)
MCHC: 34.1 g/dL (ref 30.0–36.0)
MCV: 91.8 fL (ref 80.0–100.0)
Platelets: 182 10*3/uL (ref 150–400)
RBC: 4.25 MIL/uL (ref 3.87–5.11)
RDW: 13.1 % (ref 11.5–15.5)
WBC: 7.6 10*3/uL (ref 4.0–10.5)
nRBC: 0 % (ref 0.0–0.2)

## 2021-05-24 LAB — URINALYSIS, ROUTINE W REFLEX MICROSCOPIC
Bacteria, UA: NONE SEEN
Glucose, UA: >=500 mg/dL — AB
Hgb urine dipstick: NEGATIVE
Ketones, ur: 20 mg/dL — AB
Leukocytes,Ua: NEGATIVE
Nitrite: NEGATIVE
Protein, ur: >=300 mg/dL — AB
Specific Gravity, Urine: 1.041 — ABNORMAL HIGH (ref 1.005–1.030)
pH: 5 (ref 5.0–8.0)

## 2021-05-24 LAB — COMPREHENSIVE METABOLIC PANEL
ALT: 30 U/L (ref 0–44)
AST: 38 U/L (ref 15–41)
Albumin: 2.1 g/dL — ABNORMAL LOW (ref 3.5–5.0)
Alkaline Phosphatase: 105 U/L (ref 38–126)
Anion gap: 6 (ref 5–15)
BUN: 9 mg/dL (ref 6–20)
CO2: 20 mmol/L — ABNORMAL LOW (ref 22–32)
Calcium: 8.3 mg/dL — ABNORMAL LOW (ref 8.9–10.3)
Chloride: 109 mmol/L (ref 98–111)
Creatinine, Ser: 0.7 mg/dL (ref 0.44–1.00)
GFR, Estimated: >60 mL/min (ref >60)
Glucose, Bld: 141 mg/dL — ABNORMAL HIGH (ref 70–99)
Potassium: 3.4 mmol/L — ABNORMAL LOW (ref 3.5–5.1)
Sodium: 135 mmol/L (ref 135–145)
Total Bilirubin: 0.4 mg/dL (ref 0.3–1.2)
Total Protein: 5.1 g/dL — ABNORMAL LOW (ref 6.5–8.1)

## 2021-05-24 LAB — PROTEIN / CREATININE RATIO, URINE
Creatinine, Urine: 391.1 mg/dL
Protein Creatinine Ratio: 0.93 mg/mg{Cre} — ABNORMAL HIGH (ref 0.00–0.15)
Total Protein, Urine: 362 mg/dL

## 2021-05-24 LAB — RESP PANEL BY RT-PCR (FLU A&B, COVID) ARPGX2
Influenza A by PCR: NEGATIVE
Influenza B by PCR: NEGATIVE
SARS Coronavirus 2 by RT PCR: POSITIVE — AB

## 2021-05-24 LAB — TYPE AND SCREEN
ABO/RH(D): A POS
Antibody Screen: NEGATIVE

## 2021-05-24 MED ORDER — DOCUSATE SODIUM 100 MG PO CAPS
100.0000 mg | ORAL_CAPSULE | Freq: Every day | ORAL | Status: DC
  Administered 2021-05-25: 100 mg via ORAL
  Filled 2021-05-24: qty 1

## 2021-05-24 MED ORDER — PRENATAL MULTIVITAMIN CH
1.0000 | ORAL_TABLET | Freq: Every day | ORAL | Status: DC
  Administered 2021-05-25: 1 via ORAL
  Filled 2021-05-24: qty 1

## 2021-05-24 MED ORDER — ZOLPIDEM TARTRATE 5 MG PO TABS: 5.0000 mg | ORAL_TABLET | Freq: Every evening | ORAL | Status: DC | PRN

## 2021-05-24 MED ORDER — PROCHLORPERAZINE EDISYLATE 10 MG/2ML IJ SOLN
10.0000 mg | Freq: Four times a day (QID) | INTRAMUSCULAR | Status: DC | PRN
  Administered 2021-05-24: 10 mg via INTRAVENOUS
  Filled 2021-05-24 (×2): qty 2

## 2021-05-24 MED ORDER — METOCLOPRAMIDE HCL 5 MG/ML IJ SOLN
10.0000 mg | Freq: Once | INTRAMUSCULAR | Status: AC
  Administered 2021-05-24: 10 mg via INTRAVENOUS
  Filled 2021-05-24: qty 2

## 2021-05-24 MED ORDER — DIPHENHYDRAMINE HCL 50 MG/ML IJ SOLN
25.0000 mg | Freq: Once | INTRAMUSCULAR | Status: AC
  Administered 2021-05-24: 25 mg via INTRAVENOUS
  Filled 2021-05-24: qty 1

## 2021-05-24 MED ORDER — CALCIUM CARBONATE ANTACID 500 MG PO CHEW: 2.0000 | CHEWABLE_TABLET | ORAL | Status: DC | PRN

## 2021-05-24 NOTE — MAU Provider Note (Signed)
History     CSN: [id number]  Arrival date and time: 05/24/21 2000   Event Date/Time   First Provider Initiated Contact with Patient 05/24/21 2117      Chief Complaint  Patient presents with   Leg Swelling   HPI [name] is a 40 y.o. G5P0031 at 85w1dwho presents to MAU for evaluation of multiple complaints:  Headache This is a new problem, onset around noon today. Patient's headache pain is 7/10. She took two Tylenol earlier today but did not experience relief. She denies aggravating or alleviating factors  Cough Patient reports new onset non-productive cough. She denies fever. None of her close associates are ill. She denies chest pain, SOB, palpitations, weakness, activity intolerance.  Visual disturbances This is a new problem, onset around noon today. Patient endorses intermittent blurry vision with and without her glasses on. She also reports intermittent spots on the periphery of her visual field.   Bilateral lower extremity swelling This is a recurrent problem which seems to have worsened today. Patient reports bilateral lower extremity swelling to the level of her knees. She states the skin feels "tight" and painful.   She denies contractions, vaginal bleeding, leaking of fluid, decreased fetal movement, fever, falls, or recent illness.    [facility]. She is s/p evaluation in office yesterday for HTN and had  PEC labs collected but they have not resulted yet.  OB History     Gravida  5   Para      Term      Preterm      AB  3   Living  1      SAB  3   IAB      Ectopic      Multiple      Live Births              Past Medical History:  Diagnosis Date   Abscess of left axilla - PREVOTELLA BIVIA & Staph Coag Neg 07/12/2012   MODERATE PREVOTELLA BIVIA Note: BETA LACTAMASE NEGATIVE     Asthma    as a child   [place]    skin [place] rt breast   Cellulitis 06/01/2014   RT INNER THIGH    Depression    hx   [place]    Diabetes mellitus    IDDM, Insulin pump; followed by Dr. JDelrae Rend  DVT (deep vein thrombosis) in pregnancy    GERD (gastroesophageal reflux disease)    no current meds.   Heart murmur    Hidradenitis 04/2012   bilat. thighs, left groin - open areas on thighs   Hx MRSA infection    Hx of blood clots    in left leg   Hydradenitis    PCOS (polycystic ovarian syndrome)    Pneumonia    hx   [place]   TIA (transient ischemic attack) 2019   Vitamin D insufficiency     Past Surgical History:  Procedure Laterality Date   BREAST EXCISIONAL BIOPSY Right    BREAST SURGERY     right lumpectomy   BUNIONECTOMY     left   CARDIAC CATHETERIZATION     CHOLECYSTECTOMY  12/02/2006   lap. chole.   DILATION AND EVACUATION  02/14/2007   ESOPHAGOGASTRODUODENOSCOPY  11/17/2011   Procedure: ESOPHAGOGASTRODUODENOSCOPY (EGD);  Surgeon: VLear Ng MD;  [facility];  Service: Endoscopy;  Laterality: N/A;  EYE SURGERY     exc. stye left eye   HYDRADENITIS EXCISION  04/30/2012   Procedure: EXCISION HYDRADENITIS GROIN;  Surgeon: [name], MD;  Location: [place];  Service: General;  Laterality: Left;  wide excision hidradenitis bilateral thighs and Left groin   HYDRADENITIS EXCISION Left 01/13/2014   Procedure: WIDE EXCISION HIDRADENITIS LEFT AXILLA;  Surgeon: [name], MD;  Location: [place];  Service: General;  Laterality: Left;   INCISION AND DRAINAGE ABSCESS Right 03/17/2019   Procedure: INCISION AND DRAINAGE RIGHT THIGH ABSCESS;  Surgeon: [name], MD;  Location: [place];  Service: General;  Laterality: Right;   IRRIGATION AND DEBRIDEMENT ABSCESS Right 06/02/2014   Procedure: IRRIGATION AND DEBRIDEMENT ABSCESS;  Surgeon: [name], MD;  Location: [place];  Service: General;  Laterality: Right;   IRRIGATION AND DEBRIDEMENT ABSCESS Left 09/23/2014    Procedure: IRRIGATION AND DEBRIDEMENT ABSCESS/LEFT THIGH;  Surgeon: [name], MD;  Location: [place];  Service: General;  Laterality: Left;   LEFT HEART CATHETERIZATION WITH CORONARY ANGIOGRAM N/A 07/14/2014   Procedure: LEFT HEART CATHETERIZATION WITH CORONARY ANGIOGRAM;  Surgeon: [name], MD;  Location: [facility] CATH LAB;  Service: Cardiovascular;  Laterality: N/A;   TONSILLECTOMY     WISDOM TOOTH EXTRACTION      Family History  Problem Relation Age of Onset   Cancer Mother    Anxiety disorder Mother    Depression Mother    Sexual abuse Mother    Breast cancer Mother 98   Hyperlipidemia Sister    Hypertension Sister    Sexual abuse Sister    Hypertension Father    Anxiety disorder Father    Depression Father    Drug abuse Father    Stroke Paternal Uncle    ADD / ADHD Paternal Uncle    Anxiety disorder Paternal Uncle    Anxiety disorder Maternal Aunt    Seizures Maternal Aunt    Anxiety disorder Paternal Aunt    Anxiety disorder Maternal Uncle    ADD / ADHD Cousin    ADD / ADHD Daughter     Social History   Tobacco Use   Smoking status: Every Day    Packs/day: 1.00    Years: 23.00    Pack years: 23.00    Types: Cigarettes   Smokeless tobacco: Never   Tobacco comments:    Has cut back to 1/2 to 3/4 and wants to begin useing patches again once anxiety subsides  Vaping Use   Vaping Use: Never used  Substance Use Topics   Alcohol use: No    Alcohol/week: 0.0 standard drinks   Drug use: Yes    Types: Marijuana    Comment: 2020    Allergies:  Allergies  Allergen Reactions   Latex Hives, Shortness Of Breath and Rash   Levofloxacin Shortness Of Breath and Rash   Moxifloxacin Shortness Of Breath and Rash   Oxycodone-Acetaminophen Shortness Of Breath, Swelling and Rash    NORCO/VICODIN OK   Peach [Prunus Persica] Hives and Shortness Of Breath   Potassium-Containing Compounds Other (See Comments)    IV ROUTE - CAUSES VEINS TO COLLAPS; Reports that it is  undiluted K only   Prednisone Other (See Comments)    SEVERE ELEVATION OF BLOOD SUGAR. Able to tolerate 40 mg   Propoxyphene N-Acetaminophen Swelling    SWELLING OF FACE AND THROAT   Rosiglitazone Maleate Swelling    SWELLING OF FACE AND LEGS   Xolair [Omalizumab] Other (See Comments)  Rash and anaphylaxis   Adhesive [Tape] Hives, Itching and Rash   Morphine And Related Other (See Comments)    Causes hallucinations   Prozac [Fluoxetine Hcl] Other (See Comments)    Made her very aggressive    Chantix [Varenicline]     dreams   Citrullus Vulgaris Nausea And Vomiting    Facial swelling   Humira [Adalimumab]     Does not remember    Septra [Sulfamethoxazole-Trimethoprim]    Zyvox [Linezolid]     Edema    Cefaclor Rash   Keflex [Cephalexin] Diarrhea and Rash    REACTION: severe migraine   Promethazine Hcl Other (See Comments)    IV ROUTE ONLY - JITTERY FEELING. Patient reports that it is mild and she has used promethazine since then  PO tablet ok   Sulfadiazine Rash    Medications Prior to Admission  Medication Sig Dispense Refill Last Dose   acetaminophen (TYLENOL) 500 MG tablet Take 2 tablets (1,000 mg total) by mouth every 6 (six) hours as needed for mild pain or fever.      albuterol (PROVENTIL) (2.5 MG/3ML) 0.083% nebulizer solution Take 3 mLs (2.5 mg total) by nebulization every 6 (six) hours as needed for wheezing or shortness of breath. 75 mL 12    azithromycin (ZITHROMAX) 250 MG tablet 2 today then one daily (Patient not taking: Reported on 05/14/2021) 6 tablet 0    butalbital-acetaminophen-caffeine (FIORICET) 50-325-40 MG tablet Take by mouth 2 (two) times daily as needed for headache.      doxycycline (VIBRA-TABS) 100 MG tablet TAKE 1 TABLET BY MOUTH 2 TIMES DAILY. (Patient not taking: Reported on 05/14/2021) 14 tablet prn    enoxaparin (LOVENOX) 100 MG/ML injection Inject 100 mg into the skin.      glucose blood ([employer] TEST STRIPS) test strip 1 each by Other route See  admin instructions.       HUMALOG 100 UNIT/ML injection 20-22 units pre-meal as per sliding scale 10 mL 11    hydroxyprogesterone caproate (MAKENA) 250 mg/mL OIL injection Inject 250 mg into the muscle once a week.      insulin glargine (LANTUS) 100 UNIT/ML injection INJECT 60 UNITS UNDER THE SKIN ONCE DAILY AS DIRECTED 60 mL 3    lamoTRIgine (LAMICTAL) 25 MG tablet Take 1 tablet (25 mg total) by mouth daily. After 14 days increase dose to one tablet twice a day (Patient not taking: Reported on 05/14/2021) 30 tablet 1    levonorgestrel (MIRENA) 20 MCG/24HR IUD 1 each by Intrauterine route once. Placed 04/2013      mupirocin ointment (BACTROBAN) 2 % Apply 1 application topically daily as needed. (Patient not taking: Reported on 05/14/2021)  2    ondansetron (ZOFRAN ODT) 8 MG disintegrating tablet Take 1 tablet (8 mg total) by mouth every 8 (eight) hours as needed for nausea or vomiting. 20 tablet 1    pantoprazole (PROTONIX) 40 MG tablet Take 1 tablet (40 mg total) by mouth daily. (Patient taking differently: Take 40 mg by mouth in the morning and at bedtime.) 90 tablet 1    Prenatal Vit-Fe Fumarate-FA (PRENATAL MULTIVITAMIN) TABS tablet Take 1 tablet by mouth daily at 12 noon.      sertraline (ZOLOFT) 25 MG tablet Take 1 tablet (25 mg total) by mouth daily. (Patient not taking: Reported on 05/14/2021) 15 tablet 1    spironolactone (ALDACTONE) 100 MG tablet Take 1 tablet (100 mg total) by mouth daily. (Patient not taking: Reported on 05/14/2021) 30 tablet 3  traMADol (ULTRAM) 50 MG tablet Take 1 tablet (50 mg total) by mouth every 6 (six) hours as needed for moderate pain. (Patient not taking: Reported on 05/14/2021) 30 tablet 0     Review of Systems  Constitutional:  Positive for fatigue.  Eyes:  Positive for visual disturbance.  Respiratory:  Positive for cough.   Neurological:  Positive for headaches.  All other systems reviewed and are negative. Physical Exam   Blood pressure 121/78, pulse (!)  117, temperature 98.2 F (36.8 C), temperature source Oral, resp. rate 20, height '5\' 5"'$  (1.651 m), weight 97.5 kg, SpO2 98 %.  Physical Exam Vitals and nursing note reviewed. Exam conducted with a chaperone present.  Constitutional:      Appearance: Normal appearance. She is ill-appearing.  Cardiovascular:     Rate and Rhythm: Normal rate and regular rhythm.     Pulses: Normal pulses.     Heart sounds: Normal heart sounds.  Pulmonary:     Effort: Pulmonary effort is normal.     Breath sounds: Normal breath sounds.  Abdominal:     Tenderness: There is no abdominal tenderness. There is no right CVA tenderness or left CVA tenderness.     Comments: Gravid  Musculoskeletal:     Right lower leg: 2+ Edema present.     Left lower leg: 2+ Edema present.  Skin:    Capillary Refill: Capillary refill takes less than 2 seconds.  Neurological:     Mental Status: She is alert and oriented to person, place, and time.  Psychiatric:        Mood and Affect: Mood normal.        Behavior: Behavior normal.        Thought Content: Thought content normal.        Judgment: Judgment normal.    MAU Course  Procedures  --Reactive tracing: baseline 135, mod var, + accels, no decels --[place]: quiet  Orders Placed This Encounter  Procedures   Resp Panel by RT-PCR (Flu A&B, Covid) Nasopharyngeal Swab   Protein / creatinine ratio, urine   Urinalysis, Routine w reflex microscopic   CBC   Comprehensive metabolic panel   Diet gestational carb mod Room service appropriate? Yes; Fluid consistency: Thin   Notify physician (specify)   Vital signs   Defer vaginal exam for vaginal bleeding or PROM <37 weeks   Initiate Oral Care Protocol   Initiate Carrier Fluid Protocol   SCDs   Fetal monitoring every shift x 30 minutes   Activity as tolerated   Full code   Type and screen [place]   Blood draw with IV start   Saline lock IV   Place in observation (patient's expected length of stay  will be less than 2 midnights)   Results for orders placed or performed during the hospital encounter of 05/24/21 (from the past 24 hour(s))  Protein / creatinine ratio, urine     Status: Abnormal   Collection Time: 05/24/21  8:46 PM  Result Value Ref Range   Creatinine, Urine 391.10 mg/dL   Total Protein, Urine 362 mg/dL   Protein Creatinine Ratio 0.93 (H) 0.00 - 0.15 mg/mg[Cre]  Urinalysis, Routine w reflex microscopic     Status: Abnormal   Collection Time: 05/24/21  9:02 PM  Result Value Ref Range   Color, Urine AMBER (A) YELLOW   APPearance HAZY (A) CLEAR   Specific Gravity, Urine 1.041 (H) 1.005 - 1.030   pH 5.0 5.0 - 8.0  Glucose, UA >=500 (A) NEGATIVE mg/dL   Hgb urine dipstick NEGATIVE NEGATIVE   Bilirubin Urine SMALL (A) NEGATIVE   Ketones, ur 20 (A) NEGATIVE mg/dL   Protein, ur >=300 (A) NEGATIVE mg/dL   Nitrite NEGATIVE NEGATIVE   Leukocytes,Ua NEGATIVE NEGATIVE   RBC / HPF 0-5 0 - 5 RBC/hpf   WBC, UA 0-5 0 - 5 WBC/hpf   Bacteria, UA NONE SEEN NONE SEEN   Squamous Epithelial / LPF 0-5 0 - 5   Mucus PRESENT   CBC     Status: None   Collection Time: 05/24/21  9:31 PM  Result Value Ref Range   WBC 7.6 4.0 - 10.5 K/uL   RBC 4.25 3.87 - 5.11 MIL/uL   Hemoglobin 13.3 12.0 - 15.0 g/dL   HCT 39.0 36.0 - 46.0 %   MCV 91.8 80.0 - 100.0 fL   MCH 31.3 26.0 - 34.0 pg   MCHC 34.1 30.0 - 36.0 g/dL   RDW 13.1 11.5 - 15.5 %   Platelets 182 150 - 400 K/uL   nRBC 0.0 0.0 - 0.2 %  Comprehensive metabolic panel     Status: Abnormal   Collection Time: 05/24/21  9:31 PM  Result Value Ref Range   Sodium 135 135 - 145 mmol/L   Potassium 3.4 (L) 3.5 - 5.1 mmol/L   Chloride 109 98 - 111 mmol/L   CO2 20 (L) 22 - 32 mmol/L   Glucose, Bld 141 (H) 70 - 99 mg/dL   BUN 9 6 - 20 mg/dL   Creatinine, Ser 0.70 0.44 - 1.00 mg/dL   Calcium 8.3 (L) 8.9 - 10.3 mg/dL   Total Protein 5.1 (L) 6.5 - 8.1 g/dL   Albumin 2.1 (L) 3.5 - 5.0 g/dL   AST 38 15 - 41 U/L   ALT 30 0 - 44 U/L   Alkaline  Phosphatase 105 38 - 126 U/L   Total Bilirubin 0.4 0.3 - 1.2 mg/dL   GFR, Estimated >60 >60 mL/min   Anion gap 6 5 - 15  Resp Panel by RT-PCR (Flu A&B, Covid) Nasopharyngeal Swab     Status: Abnormal   Collection Time: 05/24/21  9:55 PM   Specimen: Nasopharyngeal Swab; Nasopharyngeal(NP) swabs in vial transport medium  Result Value Ref Range   SARS Coronavirus 2 by RT PCR POSITIVE (A) NEGATIVE   Influenza A by PCR NEGATIVE NEGATIVE   Influenza B by PCR NEGATIVE NEGATIVE     Assessment and Plan  --40 y.o. G5P0031 at [redacted]w[redacted]d --GHTN with preexisting proteinuria --COVID positive --P:Cr now 0.93 (baseline 0.6) --Headache improved to 4/10 with IV cocktail --Reactive tracing --S/p consult with Dr. ERip Harbourand Dr. SWilhelmenia Blase--[facility] CNM 05/24/2021, 11:04 PM

## 2021-05-24 NOTE — MAU Note (Addendum)
PT SAYS SHE SAW DR Marcille Buffy- [facility].  TODAY - SWELLING IN LEGS IS WORSE  TONIGHT - PT CALLED DR.  DEVELOPED COUGH YESTERDAY BP WAS HIGH IN OFFICE STARTED H/A - 9A- TOOK XS TYLENOL 2 TABS  AND AGAIN AT 330PM- VISION IS BLURRY  WITH GLASSES ON HAS VOMITED X2 - TOOK ZOFRAN AT 930AM SAYS VOIDED X2 TODAY  DRANK  7 WATER BOTTLES  TODAY  FEELS CRAMPING  TYPE 1 DIABETIC- BLOOD SUGAR AT 830AM- 142

## 2021-05-24 NOTE — H&P (Signed)
[name] is a 40 y.o. female presenting for evaluation of Le swelling, SOB and HA. +FM denies VB LOF. Notes some irr cramping but denies regular contractions. Of note, endorsing cough and cold symptoms since 2 days ago, initial rapid COVID test negative  Complicated PNC including the following: 1) Type 1 DM - followed by Endo at [facility], recently placed on Omnipod Dash; last A1c 7.4 on 8/18    *Baseline proteinuria at '500mg'$  2) BMI 37 3) Anxiety in pregnancy - recently restarted Zoloft, lamictal 4) DVT of LE - diagnosed at 29 weeks left femoral, popliteal and tibial, on lovenox '100mg'$  BID 5) H/o PPROM - on Makena 6) Suspected fetal growth disorder - Office US-Short long bones (FL 1%ile adn HL 2%ile) Possibly skull shape abnormality--to MFM -- > MFM [place] 05/14/21 EFW 52%ile, isolated short FL, other long bones WNL and head WNL, possible right sided Aortic Arch, repeat ECHO planned (initial ECHO WNL)   NIPT for DiGeorge sx negative   OB History     Gravida  5   Para      Term      Preterm      AB  3   Living  1      SAB  3   IAB      Ectopic      Multiple      Live Births             Past Medical History:  Diagnosis Date   Abscess of left axilla - PREVOTELLA BIVIA & Staph Coag Neg 07/12/2012   MODERATE PREVOTELLA BIVIA Note: BETA LACTAMASE NEGATIVE     Asthma    as a child   Cancer ([place])    skin tag rt breast   Cellulitis 06/01/2014   RT INNER THIGH   Depression    hx   Diabetes ([place])    Diabetes mellitus    IDDM, Insulin pump; followed by Dr. [name]   DVT (deep vein thrombosis) in pregnancy    GERD (gastroesophageal reflux disease)    no current meds.   Heart murmur    Hidradenitis 04/2012   bilat. thighs, left groin - open areas on thighs   Hx MRSA infection    Hx of blood clots    in left leg   Hydradenitis    PCOS (polycystic ovarian syndrome)    Pneumonia    hx   SVT (supraventricular tachycardia) ([place]) 06/2017   TIA (transient ischemic  attack) 2019   Vitamin D insufficiency    Past Surgical History:  Procedure Laterality Date   BREAST EXCISIONAL BIOPSY Right    BREAST SURGERY     right lumpectomy   BUNIONECTOMY     left   CARDIAC CATHETERIZATION     CHOLECYSTECTOMY  12/02/2006   lap. chole.   DILATION AND EVACUATION  02/14/2007   ESOPHAGOGASTRODUODENOSCOPY  11/17/2011   Procedure: ESOPHAGOGASTRODUODENOSCOPY (EGD);  Surgeon: [name], MD;  [facility];  Service: Endoscopy;  Laterality: N/A;   EYE SURGERY     exc. stye left eye   HYDRADENITIS EXCISION  04/30/2012   Procedure: EXCISION HYDRADENITIS GROIN;  Surgeon: [name], MD;  Location: [place];  Service: General;  Laterality: Left;  wide excision hidradenitis bilateral thighs and Left groin   HYDRADENITIS EXCISION Left 01/13/2014   Procedure: WIDE EXCISION HIDRADENITIS LEFT AXILLA;  Surgeon: [name], MD;  Location: [place];  Service: General;  Laterality: Left;  INCISION AND DRAINAGE ABSCESS Right 03/17/2019   Procedure: INCISION AND DRAINAGE RIGHT THIGH ABSCESS;  Surgeon: [name], MD;  Location: [place];  Service: General;  Laterality: Right;   IRRIGATION AND DEBRIDEMENT ABSCESS Right 06/02/2014   Procedure: IRRIGATION AND DEBRIDEMENT ABSCESS;  Surgeon: [name], MD;  Location: [place];  Service: General;  Laterality: Right;   IRRIGATION AND DEBRIDEMENT ABSCESS Left 09/23/2014   Procedure: IRRIGATION AND DEBRIDEMENT ABSCESS/LEFT THIGH;  Surgeon: [name], MD;  Location: [place];  Service: General;  Laterality: Left;   LEFT HEART CATHETERIZATION WITH CORONARY ANGIOGRAM N/A 07/14/2014   Procedure: LEFT HEART CATHETERIZATION WITH CORONARY ANGIOGRAM;  Surgeon: [name], MD;  Location: [facility] CATH LAB;  Service: Cardiovascular;  Laterality: N/A;   TONSILLECTOMY     WISDOM TOOTH EXTRACTION     Family History: family history includes ADD / ADHD in her cousin, daughter, and paternal uncle; Anxiety  disorder in her father, maternal aunt, maternal uncle, mother, paternal aunt, and paternal uncle; Breast cancer (age of onset: 79) in her mother; Cancer in her mother; Depression in her father and mother; Drug abuse in her father; Hyperlipidemia in her sister; Hypertension in her father and sister; Seizures in her maternal aunt; Sexual abuse in her mother and sister; Stroke in her paternal uncle. Social History:  reports that she has been smoking cigarettes. She has a 23.00 pack-year smoking history. She has never used smokeless tobacco. She reports current drug use. Drug: Marijuana. She reports that she does not drink alcohol.     Maternal Diabetes: Yes:  Diabetes Type:  Pre-pregnancy Genetic Screening: Normal Maternal Ultrasounds/Referrals: Other: Fetal Ultrasounds or other Referrals:  Referred to Materal Fetal Medicine  Maternal Substance Abuse:  No Significant Maternal Medications:  None Significant Maternal Lab Results:  Group B Strep positive Other Comments:  None  Review of Systems  Constitutional:  Positive for fatigue. Negative for chills and fever.  HENT:  Positive for rhinorrhea and sore throat.   Respiratory:  Positive for cough and shortness of breath.   Cardiovascular:  Positive for palpitations and leg swelling. Negative for chest pain.  Gastrointestinal:  Negative for abdominal pain, nausea and vomiting.  Neurological:  Negative for dizziness, weakness and headaches.  Psychiatric/Behavioral:  Negative for suicidal ideas.   Maternal Medical History:  Reason for admission: Nausea.     Blood pressure 131/83, pulse 91, temperature 98.2 F (36.8 C), temperature source Oral, resp. rate 20, height '5\' 5"'$  (1.651 m), weight 97.5 kg, SpO2 99 %. Exam Physical Exam  Physical Exam Vitals and nursing note reviewed. Exam conducted with a chaperone present.  Constitutional:      Appearance: Normal appearance. She is ill-appearing.  Cardiovascular:     Rate and Rhythm: Normal rate  and regular rhythm.     Pulses: Normal pulses.     Heart sounds: Normal heart sounds.  Pulmonary:     Effort: Pulmonary effort is normal.     Breath sounds: Normal breath sounds.  Abdominal:     Tenderness: There is no abdominal tenderness. There is no right CVA tenderness or left CVA tenderness.     Comments: Gravid  Musculoskeletal:     Right lower leg: 2+ Edema present.     Left lower leg: 2+ Edema present.  Skin:    Capillary Refill: Capillary refill takes less than 2 seconds.  Neurological:     Mental Status: She is alert and oriented to person, place, and time.  Psychiatric:  Mood and Affect: Mood normal.        Behavior: Behavior normal.        Thought Content: Thought content normal.        Judgment: Judgment normal.  Prenatal labs: ABO, Rh:  Apos Antibody:  nrg Rubella:  imm RPR:   nr HBsAg:   neg HIV:   nr GBS:   POS  Assessment/Plan: This is a 40yo [id number] @ 45 1/7 by 12wk TVUS NOT c/w LMP admitted for BP monitoring. Found to be COVID positive on admission with viral URI symptoms x2d. PNC additionally complicated by 0000000, BMI 37, abnormal ultrasound findings, LLE DVT on BID Lovenox  Plan to admit to [facility] specialty care for BP monitoring.    *Pt w/ elevated BP x1. Proteinuria present however not reliable given chronic T1DM with questionable control    *rest of PreE labs WNL, LE edema not significantly changed from 2 days ago in office    *HA improved with cocktail in MAU  One-time Diabetic counseling consult in AM to ensure patient has appropriate control, patient may self check BG   Continue Lovenox BID for known DVT esp in setting of new COVID pos status  Precautions as ordered  [place] 05/24/2021, 11:11 PM

## 2021-05-25 ENCOUNTER — Encounter (HOSPITAL_COMMUNITY): Payer: Self-pay | Admitting: Obstetrics and Gynecology

## 2021-05-25 DIAGNOSIS — O98511 Other viral diseases complicating pregnancy, first trimester: Secondary | ICD-10-CM | POA: Diagnosis not present

## 2021-05-25 MED ORDER — ENOXAPARIN SODIUM 100 MG/ML IJ SOSY
100.0000 mg | PREFILLED_SYRINGE | Freq: Two times a day (BID) | INTRAMUSCULAR | Status: DC
  Administered 2021-05-25: 100 mg via SUBCUTANEOUS
  Filled 2021-05-25 (×2): qty 1

## 2021-05-25 MED ORDER — ALBUTEROL SULFATE (2.5 MG/3ML) 0.083% IN NEBU: 2.5000 mg | INHALATION_SOLUTION | Freq: Four times a day (QID) | RESPIRATORY_TRACT | Status: DC | PRN

## 2021-05-25 MED ORDER — PANTOPRAZOLE SODIUM 40 MG PO TBEC
40.0000 mg | DELAYED_RELEASE_TABLET | Freq: Two times a day (BID) | ORAL | Status: DC
  Administered 2021-05-25 (×2): 40 mg via ORAL
  Filled 2021-05-25 (×2): qty 1

## 2021-05-25 MED ORDER — LAMOTRIGINE 25 MG PO TABS: 25.0000 mg | ORAL_TABLET | Freq: Every day | ORAL | Status: DC

## 2021-05-25 MED ORDER — SERTRALINE HCL 50 MG PO TABS: 25.0000 mg | ORAL_TABLET | Freq: Every day | ORAL | Status: DC

## 2021-05-25 MED ORDER — BUTALBITAL-APAP-CAFFEINE 50-325-40 MG PO TABS: 1.0000 | ORAL_TABLET | Freq: Four times a day (QID) | ORAL | Status: DC | PRN

## 2021-05-25 NOTE — Discharge Summary (Signed)
Physician Discharge Summary  Patient ID: [name] MRN: [id number] DOB/AGE: [birth year] 40 y.o.  Admit date: 05/24/2021 Discharge date: 05/25/2021  Admission Diagnoses:  Discharge Diagnoses:  Active Problems:   Elevated blood pressure affecting pregnancy in third trimester, antepartum   Discharged Condition: stable  Hospital Course: Pt admitted for BP monitoring due to significant LE edema. BP stayed stable and in normal range overnight. Pt was incidentally diagnosed with +covid - she remained in stable status and did not require any intervention. Baby in stable condition per monitoring while admitted. Pt is deemed stable for discharge to home on HD#2  Consults: None  Significant Diagnostic Studies: labs: stable  Treatments: IV hydration  Discharge Exam: Blood pressure 122/73, pulse 84, temperature 97.9 F (36.6 C), temperature source Oral, resp. rate 18, height '5\' 5"'$  (1.651 m), weight 97.5 kg, SpO2 99 %. General appearance: alert, cooperative, and no distress  Disposition: Discharge disposition: 01-Home or Self Care       Discharge Instructions     Call MD for:  difficulty breathing, headache or visual disturbances   Complete by: As directed    Call MD for:  extreme fatigue   Complete by: As directed    Call MD for:  persistant dizziness or light-headedness   Complete by: As directed    Call MD for:  persistant nausea and vomiting   Complete by: As directed    Call MD for:  severe uncontrolled pain   Complete by: As directed    Call MD for:  temperature >100.4   Complete by: As directed    Diet - low sodium heart healthy   Complete by: As directed    Discharge instructions   Complete by: As directed    Call office with any concerns [phone]   Driving Restrictions   Complete by: As directed    Increase activity slowly   Complete by: As directed    Lifting restrictions   Complete by: As directed    <15lbs      Allergies as of 05/25/2021        Reactions   Latex Hives, Shortness Of Breath, Rash   Levofloxacin Shortness Of Breath, Rash   Moxifloxacin Shortness Of Breath, Rash   Oxycodone-acetaminophen Shortness Of Breath, Swelling, Rash   NORCO/VICODIN OK   Peach [prunus Persica] Hives, Shortness Of Breath   Potassium-containing Compounds Other (See Comments)   IV ROUTE - CAUSES VEINS TO COLLAPS; Reports that it is undiluted K only   Prednisone Other (See Comments)   SEVERE ELEVATION OF BLOOD SUGAR. Able to tolerate 40 mg   Propoxyphene N-acetaminophen Swelling   SWELLING OF FACE AND THROAT   Rosiglitazone Maleate Swelling   SWELLING OF FACE AND LEGS   Xolair [omalizumab] Other (See Comments)   Rash and anaphylaxis   Adhesive [tape] Hives, Itching, Rash   Morphine And Related Other (See Comments)   Causes hallucinations   Prozac [fluoxetine Hcl] Other (See Comments)   Made her very aggressive    Chantix [varenicline]    dreams   Citrullus Vulgaris Nausea And Vomiting   Facial swelling   Humira [adalimumab]    Does not remember    Septra [sulfamethoxazole-trimethoprim]    Zyvox [linezolid]    Edema    Cefaclor Rash   Keflex [cephalexin] Diarrhea, Rash   REACTION: severe migraine   Promethazine Hcl Other (See Comments)   IV ROUTE ONLY - JITTERY FEELING. Patient reports that it is mild and she has used promethazine  since then PO tablet ok   Sulfadiazine Rash        Medication List     STOP taking these medications    azithromycin 250 MG tablet Commonly known as: ZITHROMAX   doxycycline 100 MG tablet Commonly known as: VIBRA-TABS   levonorgestrel 20 MCG/24HR IUD Commonly known as: MIRENA       TAKE these medications    acetaminophen 500 MG tablet Commonly known as: TYLENOL Take 2 tablets (1,000 mg total) by mouth every 6 (six) hours as needed for mild pain or fever.   albuterol (2.5 MG/3ML) 0.083% nebulizer solution Commonly known as: PROVENTIL Take 3 mLs (2.5 mg total) by nebulization every 6  (six) hours as needed for wheezing or shortness of breath.   butalbital-acetaminophen-caffeine 50-325-40 MG tablet Commonly known as: FIORICET Take by mouth 2 (two) times daily as needed for headache.   enoxaparin 100 MG/ML injection Commonly known as: LOVENOX Inject 100 mg into the skin.   HumaLOG 100 UNIT/ML injection Generic drug: insulin lispro 20-22 units pre-meal as per sliding scale   hydroxyprogesterone caproate 250 mg/mL Oil injection Commonly known as: MAKENA Inject 250 mg into the muscle once a week.   insulin glargine 100 UNIT/ML injection Commonly known as: Lantus INJECT 60 UNITS UNDER THE SKIN ONCE DAILY AS DIRECTED   [employer] TEST STRIPS test strip Generic drug: glucose blood 1 each by Other route See admin instructions.   lamoTRIgine 25 MG tablet Commonly known as: LaMICtal Take 1 tablet (25 mg total) by mouth daily. After 14 days increase dose to one tablet twice a day   mupirocin ointment 2 % Commonly known as: BACTROBAN Apply 1 application topically daily as needed.   ondansetron 8 MG disintegrating tablet Commonly known as: Zofran ODT Take 1 tablet (8 mg total) by mouth every 8 (eight) hours as needed for nausea or vomiting.   pantoprazole 40 MG tablet Commonly known as: PROTONIX Take 1 tablet (40 mg total) by mouth daily. What changed: when to take this   prenatal multivitamin Tabs tablet Take 1 tablet by mouth daily at 12 noon.   sertraline 25 MG tablet Commonly known as: Zoloft Take 1 tablet (25 mg total) by mouth daily.   spironolactone 100 MG tablet Commonly known as: ALDACTONE Take 1 tablet (100 mg total) by mouth daily.   traMADol 50 MG tablet Commonly known as: ULTRAM Take 1 tablet (50 mg total) by mouth every 6 (six) hours as needed for moderate pain.        Follow-up Information     Paula Compton, MD. Schedule an appointment as soon as possible for a visit.   Specialty: Obstetrics and Gynecology Why: For ultrasoind and  office visit ( need to reschedule current appointment ) Contact information: [address] [place] [address] [phone]                 Signed: Isaiah Serge 05/25/2021, 12:28 PM

## 2021-05-25 NOTE — Progress Notes (Signed)
Patient ID: [name], female   DOB: [birth year], 40 y.o.   MRN: [id number] Pt seen and examined. She reports feels well, just "bloated". She denies SOB, body aches, fever or chills. She is appreciating FMs. She also denies any cramping or abnormal vaginal bleeding. She would like discharge to home today  VS: 122-126/73-76, 78 GEN - NAD ABD - cw GA EFM - cat 1, 120s, + accels, no decels  EXT - +2 edema   b/l , no homans   AST- 38, ALT -30, 7.6>13.3<182 Glucose 141   A/P: 40yo [id number] female at 52 2/[redacted]wks gestation:         - BP stable overnight; neg preE workup and asymptomatic               - rec maternity belt and compression socks         - Covid infection - not vaccinated; symptoms stable.                 - no intervention required at this time                 - call if symptoms worsen           T1DM - discussed with diabetic coordinator.                         Pt to discuss glucose control with Dr Marvel Plan and her endocrinologist outpatient. Keep on current insulin regimen at this time  Discharge home with outpt follow up to be setup by OBGYN office on 9/6

## 2021-05-25 NOTE — Plan of Care (Signed)
  Problem: Education: Goal: Knowledge of General Education information will improve Description: Including pain rating scale, medication(s)/side effects and non-pharmacologic comfort measures Outcome: Completed/Met   Problem: Health Behavior/Discharge Planning: Goal: Ability to manage health-related needs will improve Outcome: Completed/Met   Problem: Clinical Measurements: Goal: Ability to maintain clinical measurements within normal limits will improve Outcome: Completed/Met Goal: Will remain free from infection Outcome: Completed/Met Goal: Diagnostic test results will improve Outcome: Completed/Met Goal: Respiratory complications will improve Outcome: Completed/Met Goal: Cardiovascular complication will be avoided Outcome: Completed/Met   Problem: Activity: Goal: Risk for activity intolerance will decrease Outcome: Completed/Met   Problem: Nutrition: Goal: Adequate nutrition will be maintained Outcome: Completed/Met   Problem: Coping: Goal: Level of anxiety will decrease Outcome: Completed/Met   Problem: Elimination: Goal: Will not experience complications related to bowel motility Outcome: Completed/Met Goal: Will not experience complications related to urinary retention Outcome: Completed/Met   Problem: Pain Managment: Goal: General experience of comfort will improve Outcome: Completed/Met   Problem: Safety: Goal: Ability to remain free from injury will improve Outcome: Completed/Met   Problem: Skin Integrity: Goal: Risk for impaired skin integrity will decrease Outcome: Completed/Met   Problem: Education: Goal: Knowledge of disease or condition will improve Outcome: Completed/Met Goal: Knowledge of the prescribed therapeutic regimen will improve Outcome: Completed/Met Goal: Individualized Educational Video(s) Outcome: Completed/Met   Problem: Clinical Measurements: Goal: Complications related to the disease process, condition or treatment will be  avoided or minimized Outcome: Completed/Met   Problem: Education: Goal: Knowledge of disease or condition will improve Outcome: Completed/Met Goal: Knowledge of the prescribed therapeutic regimen will improve Outcome: Completed/Met   Problem: Fluid Volume: Goal: Peripheral tissue perfusion will improve Outcome: Completed/Met   Problem: Clinical Measurements: Goal: Complications related to disease process, condition or treatment will be avoided or minimized Outcome: Completed/Met   Problem: Education: Goal: Ability to describe self-care measures that may prevent or decrease complications (Diabetes Survival Skills Education) will improve Outcome: Completed/Met Goal: Individualized Educational Video(s) Outcome: Completed/Met   Problem: Coping: Goal: Ability to adjust to condition or change in health will improve Outcome: Completed/Met   Problem: Fluid Volume: Goal: Ability to maintain a balanced intake and output will improve Outcome: Completed/Met   Problem: Health Behavior/Discharge Planning: Goal: Ability to identify and utilize available resources and services will improve Outcome: Completed/Met Goal: Ability to manage health-related needs will improve Outcome: Completed/Met   Problem: Metabolic: Goal: Ability to maintain appropriate glucose levels will improve Outcome: Completed/Met   Problem: Nutritional: Goal: Maintenance of adequate nutrition will improve Outcome: Completed/Met Goal: Progress toward achieving an optimal weight will improve Outcome: Completed/Met   Problem: Skin Integrity: Goal: Risk for impaired skin integrity will decrease Outcome: Completed/Met   Problem: Tissue Perfusion: Goal: Adequacy of tissue perfusion will improve Outcome: Completed/Met

## 2021-05-25 NOTE — Discharge Instructions (Signed)
Call office with any concerns [phone] 

## 2021-05-25 NOTE — Progress Notes (Addendum)
Inpatient Diabetes Program Recommendations  ADA Standards of Care 2022 Diabetes in Pregnancy Target Glucose Ranges:  Fasting: 60 - 90 mg/dL Preprandial: 60 - 105 mg/dL 1 hr postprandial: Less than '140mg'$ /dL (from first bite of meal) 2 hr postprandial: Less than 120 mg/dL (from first bit of meal)    Lab Results  Component Value Date   GLUCAP 148 (H) 03/18/2019   HGBA1C 10.6 (H) 10/04/2018    Review of Glycemic Control  Diabetes history: DM type 1 Outpatient Diabetes medications: Omnipod insulin pump with humalog, dexcom at home not on pt currently  Insulin pump setting: Basal: 12A-5A 1.3 units/hour 5A-12A 1.4 units/hour  Carbohydrate coverage 1 unit for every 12 grams of carbs Target 100-140  Pre-pregnancy Lantus 60 units qhs, and Humalog 15 units tid (not doing SSI or carb counting)  Current orders for Inpatient glycemic control:  Nothing ordered  Omnipod (needs to be changed 9/4 at 9 pm) Pts mom to bring insulin pump supplies between now and then Last A1c 7.4% on 8/18 Sees Endocrinology Dr. Vance Peper, [facility] next visit September 13th  Inpatient Diabetes Program Recommendations:   - place orders for CBG fasting and 2 hour postprandial (she does not have a CGM) - place insulin pump order set   Spoke with pt over the phone. She is not well controlled at baseline, which is interesting since she sees an Endocrinologist. Her settings for target glucose is between 100-140 not 120. She was never told where her glucose trends need to be and never told how to check her glucose 2 hours postprandial. Even though her A1c looks ok. she is not where she needs to be.  I spoke with pt regarding glucose targets and when to check glucose and bolus 2 hour postprandially. Encouraged pt to contact her Endocrinology office encouraging them to get more control over her glucose trends by doing some pump setting changes.  Will monitor glucose trends while on the pump. May need extra insulin  to get at goal range.  Called Dr. Terri Piedra regarding plan of care and orders. Dr. Terri Piedra to assess and order.  Thanks,  Tama Headings RN, MSN, BC-ADM Inpatient Diabetes Coordinator Team Pager [phone] (8a-5p)

## 2021-05-30 ENCOUNTER — Inpatient Hospital Stay (HOSPITAL_COMMUNITY)
Admission: AD | Admit: 2021-05-30 | Discharge: 2021-06-07 | DRG: 783 | Disposition: A | Discharge status: Home / Self Care | Payer: Medicaid Other | Attending: Obstetrics and Gynecology | Admitting: Obstetrics and Gynecology

## 2021-05-30 ENCOUNTER — Encounter (HOSPITAL_COMMUNITY): Payer: Self-pay | Admitting: Obstetrics and Gynecology

## 2021-05-30 ENCOUNTER — Other Ambulatory Visit: Payer: Self-pay

## 2021-05-30 ENCOUNTER — Inpatient Hospital Stay (HOSPITAL_COMMUNITY): Payer: Medicaid Other

## 2021-05-30 DIAGNOSIS — Z3A34 34 weeks gestation of pregnancy: Secondary | ICD-10-CM

## 2021-05-30 DIAGNOSIS — O9952 Diseases of the respiratory system complicating childbirth: Secondary | ICD-10-CM | POA: Diagnosis present

## 2021-05-30 DIAGNOSIS — Z794 Long term (current) use of insulin: Secondary | ICD-10-CM

## 2021-05-30 DIAGNOSIS — E109 Type 1 diabetes mellitus without complications: Secondary | ICD-10-CM | POA: Diagnosis present

## 2021-05-30 DIAGNOSIS — J45909 Unspecified asthma, uncomplicated: Secondary | ICD-10-CM | POA: Diagnosis present

## 2021-05-30 DIAGNOSIS — F4310 Post-traumatic stress disorder, unspecified: Secondary | ICD-10-CM | POA: Diagnosis present

## 2021-05-30 DIAGNOSIS — O2402 Pre-existing diabetes mellitus, type 1, in childbirth: Secondary | ICD-10-CM | POA: Diagnosis present

## 2021-05-30 DIAGNOSIS — O1410 Severe pre-eclampsia, unspecified trimester: Secondary | ICD-10-CM | POA: Diagnosis present

## 2021-05-30 DIAGNOSIS — O99214 Obesity complicating childbirth: Secondary | ICD-10-CM | POA: Diagnosis present

## 2021-05-30 DIAGNOSIS — F40240 Claustrophobia: Secondary | ICD-10-CM | POA: Diagnosis not present

## 2021-05-30 DIAGNOSIS — M25552 Pain in left hip: Secondary | ICD-10-CM | POA: Diagnosis present

## 2021-05-30 DIAGNOSIS — F17210 Nicotine dependence, cigarettes, uncomplicated: Secondary | ICD-10-CM | POA: Diagnosis present

## 2021-05-30 DIAGNOSIS — F3181 Bipolar II disorder: Secondary | ICD-10-CM | POA: Diagnosis present

## 2021-05-30 DIAGNOSIS — R059 Cough, unspecified: Secondary | ICD-10-CM | POA: Diagnosis present

## 2021-05-30 DIAGNOSIS — Z98891 History of uterine scar from previous surgery: Secondary | ICD-10-CM

## 2021-05-30 DIAGNOSIS — M25551 Pain in right hip: Secondary | ICD-10-CM | POA: Diagnosis not present

## 2021-05-30 DIAGNOSIS — O99892 Other specified diseases and conditions complicating childbirth: Secondary | ICD-10-CM | POA: Diagnosis present

## 2021-05-30 DIAGNOSIS — Z86718 Personal history of other venous thrombosis and embolism: Secondary | ICD-10-CM

## 2021-05-30 DIAGNOSIS — Z9641 Presence of insulin pump (external) (internal): Secondary | ICD-10-CM | POA: Diagnosis present

## 2021-05-30 DIAGNOSIS — F410 Panic disorder [episodic paroxysmal anxiety] without agoraphobia: Secondary | ICD-10-CM | POA: Diagnosis present

## 2021-05-30 DIAGNOSIS — O9852 Other viral diseases complicating childbirth: Secondary | ICD-10-CM | POA: Diagnosis present

## 2021-05-30 DIAGNOSIS — O98513 Other viral diseases complicating pregnancy, third trimester: Secondary | ICD-10-CM

## 2021-05-30 DIAGNOSIS — O99344 Other mental disorders complicating childbirth: Secondary | ICD-10-CM | POA: Diagnosis present

## 2021-05-30 DIAGNOSIS — U071 COVID-19: Secondary | ICD-10-CM

## 2021-05-30 DIAGNOSIS — Z8614 Personal history of Methicillin resistant Staphylococcus aureus infection: Secondary | ICD-10-CM

## 2021-05-30 DIAGNOSIS — O99324 Drug use complicating childbirth: Secondary | ICD-10-CM | POA: Diagnosis present

## 2021-05-30 DIAGNOSIS — R197 Diarrhea, unspecified: Secondary | ICD-10-CM | POA: Diagnosis present

## 2021-05-30 DIAGNOSIS — O99824 Streptococcus B carrier state complicating childbirth: Secondary | ICD-10-CM | POA: Diagnosis present

## 2021-05-30 DIAGNOSIS — O1413 Severe pre-eclampsia, third trimester: Secondary | ICD-10-CM

## 2021-05-30 DIAGNOSIS — O358XX0 Maternal care for other (suspected) fetal abnormality and damage, not applicable or unspecified: Secondary | ICD-10-CM

## 2021-05-30 DIAGNOSIS — O99334 Smoking (tobacco) complicating childbirth: Secondary | ICD-10-CM | POA: Diagnosis present

## 2021-05-30 DIAGNOSIS — I82409 Acute embolism and thrombosis of unspecified deep veins of unspecified lower extremity: Secondary | ICD-10-CM

## 2021-05-30 DIAGNOSIS — O99893 Other specified diseases and conditions complicating puerperium: Secondary | ICD-10-CM | POA: Diagnosis present

## 2021-05-30 DIAGNOSIS — Z23 Encounter for immunization: Secondary | ICD-10-CM

## 2021-05-30 DIAGNOSIS — O2233 Deep phlebothrombosis in pregnancy, third trimester: Secondary | ICD-10-CM

## 2021-05-30 DIAGNOSIS — F411 Generalized anxiety disorder: Secondary | ICD-10-CM | POA: Diagnosis present

## 2021-05-30 DIAGNOSIS — O133 Gestational [pregnancy-induced] hypertension without significant proteinuria, third trimester: Secondary | ICD-10-CM | POA: Diagnosis present

## 2021-05-30 DIAGNOSIS — Z302 Encounter for sterilization: Secondary | ICD-10-CM

## 2021-05-30 DIAGNOSIS — O1414 Severe pre-eclampsia complicating childbirth: Principal | ICD-10-CM | POA: Diagnosis present

## 2021-05-30 DIAGNOSIS — F1290 Cannabis use, unspecified, uncomplicated: Secondary | ICD-10-CM | POA: Diagnosis present

## 2021-05-30 LAB — URINALYSIS, MICROSCOPIC (REFLEX): Squamous Epithelial / HPF: NONE SEEN (ref 0–5)

## 2021-05-30 LAB — GLUCOSE, CAPILLARY
Glucose-Capillary: 177 mg/dL — ABNORMAL HIGH (ref 70–99)
Glucose-Capillary: 182 mg/dL — ABNORMAL HIGH (ref 70–99)
Glucose-Capillary: 188 mg/dL — ABNORMAL HIGH (ref 70–99)
Glucose-Capillary: 200 mg/dL — ABNORMAL HIGH (ref 70–99)
Glucose-Capillary: 217 mg/dL — ABNORMAL HIGH (ref 70–99)
Glucose-Capillary: 218 mg/dL — ABNORMAL HIGH (ref 70–99)
Glucose-Capillary: 221 mg/dL — ABNORMAL HIGH (ref 70–99)
Glucose-Capillary: 225 mg/dL — ABNORMAL HIGH (ref 70–99)
Glucose-Capillary: 230 mg/dL — ABNORMAL HIGH (ref 70–99)
Glucose-Capillary: 241 mg/dL — ABNORMAL HIGH (ref 70–99)
Glucose-Capillary: 243 mg/dL — ABNORMAL HIGH (ref 70–99)
Glucose-Capillary: 249 mg/dL — ABNORMAL HIGH (ref 70–99)
Glucose-Capillary: 250 mg/dL — ABNORMAL HIGH (ref 70–99)

## 2021-05-30 LAB — COMPREHENSIVE METABOLIC PANEL
ALT: 24 U/L (ref 0–44)
AST: 28 U/L (ref 15–41)
Albumin: 2.3 g/dL — ABNORMAL LOW (ref 3.5–5.0)
Alkaline Phosphatase: 141 U/L — ABNORMAL HIGH (ref 38–126)
Anion gap: 10 (ref 5–15)
BUN: 12 mg/dL (ref 6–20)
CO2: 19 mmol/L — ABNORMAL LOW (ref 22–32)
Calcium: 8.3 mg/dL — ABNORMAL LOW (ref 8.9–10.3)
Chloride: 105 mmol/L (ref 98–111)
Creatinine, Ser: 0.75 mg/dL (ref 0.44–1.00)
GFR, Estimated: >60 mL/min (ref >60)
Glucose, Bld: 226 mg/dL — ABNORMAL HIGH (ref 70–99)
Potassium: 4.2 mmol/L (ref 3.5–5.1)
Sodium: 134 mmol/L — ABNORMAL LOW (ref 135–145)
Total Bilirubin: 0.3 mg/dL (ref 0.3–1.2)
Total Protein: 5.5 g/dL — ABNORMAL LOW (ref 6.5–8.1)

## 2021-05-30 LAB — CBC
HCT: 41.7 % (ref 36.0–46.0)
Hemoglobin: 14.1 g/dL (ref 12.0–15.0)
MCH: 31.5 pg (ref 26.0–34.0)
MCHC: 33.8 g/dL (ref 30.0–36.0)
MCV: 93.1 fL (ref 80.0–100.0)
Platelets: 148 10*3/uL — ABNORMAL LOW (ref 150–400)
RBC: 4.48 MIL/uL (ref 3.87–5.11)
RDW: 12.8 % (ref 11.5–15.5)
WBC: 8.1 10*3/uL (ref 4.0–10.5)
nRBC: 0 % (ref 0.0–0.2)

## 2021-05-30 LAB — URINALYSIS, ROUTINE W REFLEX MICROSCOPIC
Glucose, UA: >=500 mg/dL — AB
Hgb urine dipstick: NEGATIVE
Ketones, ur: 40 mg/dL — AB
Leukocytes,Ua: NEGATIVE
Nitrite: NEGATIVE
Protein, ur: >300 mg/dL — AB
Specific Gravity, Urine: >1.03 — ABNORMAL HIGH (ref 1.005–1.030)
pH: 6 (ref 5.0–8.0)

## 2021-05-30 LAB — HEMOGLOBIN A1C
Hgb A1c MFr Bld: 6.9 % — ABNORMAL HIGH (ref 4.8–5.6)
Mean Plasma Glucose: 151.33 mg/dL

## 2021-05-30 LAB — TYPE AND SCREEN
ABO/RH(D): A POS
Antibody Screen: NEGATIVE

## 2021-05-30 LAB — MAGNESIUM: Magnesium: 1.6 mg/dL — ABNORMAL LOW (ref 1.7–2.4)

## 2021-05-30 LAB — PHOSPHORUS: Phosphorus: 3.9 mg/dL (ref 2.5–4.6)

## 2021-05-30 LAB — PROTEIN / CREATININE RATIO, URINE
Creatinine, Urine: 180.96 mg/dL
Protein Creatinine Ratio: 1.19 mg/mg{Cre} — ABNORMAL HIGH (ref 0.00–0.15)
Total Protein, Urine: 215 mg/dL

## 2021-05-30 LAB — LACTIC ACID, PLASMA: Lactic Acid, Venous: 1.1 mmol/L (ref 0.5–1.9)

## 2021-05-30 LAB — BETA-HYDROXYBUTYRIC ACID: Beta-Hydroxybutyric Acid: 1.7 mmol/L — ABNORMAL HIGH (ref 0.05–0.27)

## 2021-05-30 MED ORDER — ALBUTEROL SULFATE HFA 108 (90 BASE) MCG/ACT IN AERS
2.0000 | INHALATION_SPRAY | Freq: Four times a day (QID) | RESPIRATORY_TRACT | Status: DC | PRN
  Filled 2021-05-30: qty 6.7

## 2021-05-30 MED ORDER — SODIUM CHLORIDE 0.9 % IV SOLN: 250.0000 mL | INTRAVENOUS | Status: DC | PRN

## 2021-05-30 MED ORDER — DOCUSATE SODIUM 100 MG PO CAPS
100.0000 mg | ORAL_CAPSULE | Freq: Every day | ORAL | Status: DC
  Administered 2021-05-30 – 2021-06-01 (×3): 100 mg via ORAL
  Filled 2021-05-30 (×3): qty 1

## 2021-05-30 MED ORDER — INSULIN ASPART 100 UNIT/ML IJ SOLN: 20.0000 [IU] | Freq: Three times a day (TID) | INTRAMUSCULAR | Status: DC

## 2021-05-30 MED ORDER — PRENATAL MULTIVITAMIN CH
1.0000 | ORAL_TABLET | Freq: Every day | ORAL | Status: DC
  Administered 2021-05-30 – 2021-06-01 (×3): 1 via ORAL
  Filled 2021-05-30 (×3): qty 1

## 2021-05-30 MED ORDER — PANTOPRAZOLE SODIUM 40 MG PO TBEC
40.0000 mg | DELAYED_RELEASE_TABLET | Freq: Two times a day (BID) | ORAL | Status: DC
  Administered 2021-05-30 – 2021-06-07 (×16): 40 mg via ORAL
  Filled 2021-05-30 (×16): qty 1

## 2021-05-30 MED ORDER — BETAMETHASONE SOD PHOS & ACET 6 (3-3) MG/ML IJ SUSP
12.0000 mg | INTRAMUSCULAR | Status: AC
Start: 2021-05-30 — End: 2021-05-31
  Administered 2021-05-30 – 2021-05-31 (×2): 12 mg via INTRAMUSCULAR
  Filled 2021-05-30: qty 5

## 2021-05-30 MED ORDER — ACETAMINOPHEN 325 MG PO TABS
650.0000 mg | ORAL_TABLET | ORAL | Status: DC | PRN
  Administered 2021-05-31: 650 mg via ORAL
  Filled 2021-05-30: qty 2

## 2021-05-30 MED ORDER — LABETALOL HCL 5 MG/ML IV SOLN: 80.0000 mg | INTRAVENOUS | Status: DC | PRN

## 2021-05-30 MED ORDER — DEXTROSE IN LACTATED RINGERS 5 % IV SOLN
INTRAVENOUS | Status: DC
  Administered 2021-06-01: 125 mL/h via INTRAVENOUS
  Administered 2021-06-03: 25 mL via INTRAVENOUS

## 2021-05-30 MED ORDER — METOCLOPRAMIDE HCL 10 MG PO TABS
10.0000 mg | ORAL_TABLET | Freq: Three times a day (TID) | ORAL | Status: DC | PRN
  Administered 2021-05-30 – 2021-05-31 (×2): 10 mg via ORAL
  Filled 2021-05-30 (×3): qty 1

## 2021-05-30 MED ORDER — HYDRALAZINE HCL 20 MG/ML IJ SOLN: 10.0000 mg | INTRAMUSCULAR | Status: DC | PRN

## 2021-05-30 MED ORDER — BUTALBITAL-APAP-CAFFEINE 50-325-40 MG PO TABS
1.0000 | ORAL_TABLET | ORAL | Status: DC | PRN
  Administered 2021-05-30 – 2021-05-31 (×3): 1 via ORAL
  Filled 2021-05-30 (×3): qty 1

## 2021-05-30 MED ORDER — SODIUM CHLORIDE 0.9% FLUSH: 3.0000 mL | INTRAVENOUS | Status: DC | PRN

## 2021-05-30 MED ORDER — LACTATED RINGERS IV BOLUS
1000.0000 mL | Freq: Once | INTRAVENOUS | Status: AC
  Administered 2021-05-30: 1000 mL via INTRAVENOUS

## 2021-05-30 MED ORDER — LABETALOL HCL 5 MG/ML IV SOLN: 40.0000 mg | INTRAVENOUS | Status: DC | PRN

## 2021-05-30 MED ORDER — DEXTROSE 50 % IV SOLN
0.0000 mL | INTRAVENOUS | Status: DC | PRN
  Administered 2021-06-01 (×2): 25 mL via INTRAVENOUS
  Filled 2021-05-30: qty 50

## 2021-05-30 MED ORDER — LACTATED RINGERS IV SOLN: INTRAVENOUS | Status: DC

## 2021-05-30 MED ORDER — SODIUM CHLORIDE 0.9% FLUSH
3.0000 mL | Freq: Two times a day (BID) | INTRAVENOUS | Status: DC
  Administered 2021-06-01: 3 mL via INTRAVENOUS

## 2021-05-30 MED ORDER — LABETALOL HCL 5 MG/ML IV SOLN: 20.0000 mg | INTRAVENOUS | Status: DC | PRN

## 2021-05-30 MED ORDER — ONDANSETRON 4 MG PO TBDP: 8.0000 mg | ORAL_TABLET | Freq: Three times a day (TID) | ORAL | Status: DC | PRN

## 2021-05-30 MED ORDER — CALCIUM CARBONATE ANTACID 500 MG PO CHEW
2.0000 | CHEWABLE_TABLET | ORAL | Status: DC | PRN
  Administered 2021-05-31: 400 mg via ORAL
  Filled 2021-05-30: qty 2

## 2021-05-30 MED ORDER — ZOLPIDEM TARTRATE 5 MG PO TABS: 5.0000 mg | ORAL_TABLET | Freq: Every evening | ORAL | Status: DC | PRN

## 2021-05-30 MED ORDER — CYCLOBENZAPRINE HCL 5 MG PO TABS
10.0000 mg | ORAL_TABLET | Freq: Once | ORAL | Status: AC
  Administered 2021-05-30: 10 mg via ORAL
  Filled 2021-05-30 (×2): qty 2

## 2021-05-30 MED ORDER — SODIUM CHLORIDE 0.9 % IV SOLN
8.0000 mg | Freq: Once | INTRAVENOUS | Status: AC
  Administered 2021-05-30: 8 mg via INTRAVENOUS
  Filled 2021-05-30: qty 4

## 2021-05-30 MED ORDER — ENOXAPARIN SODIUM 100 MG/ML IJ SOSY: 100.0000 mg | PREFILLED_SYRINGE | Freq: Two times a day (BID) | INTRAMUSCULAR | Status: DC

## 2021-05-30 MED ORDER — ENOXAPARIN SODIUM 100 MG/ML IJ SOSY
1.0000 mg/kg | PREFILLED_SYRINGE | Freq: Two times a day (BID) | INTRAMUSCULAR | Status: DC
  Filled 2021-05-30: qty 0.97

## 2021-05-30 MED ORDER — INSULIN REGULAR(HUMAN) IN NACL 100-0.9 UT/100ML-% IV SOLN
INTRAVENOUS | Status: DC
  Administered 2021-05-30: 11.5 [IU]/h via INTRAVENOUS
  Administered 2021-05-30: 25 [IU]/h via INTRAVENOUS
  Administered 2021-05-31: 7 [IU]/h via INTRAVENOUS
  Administered 2021-05-31: 22 [IU]/h via INTRAVENOUS
  Administered 2021-06-01: 3.2 [IU]/h via INTRAVENOUS
  Administered 2021-06-01: 4.8 [IU]/h via INTRAVENOUS
  Filled 2021-05-30 (×6): qty 100

## 2021-05-30 MED ORDER — INSULIN LISPRO 100 UNIT/ML ~~LOC~~ SOLN: 20.0000 [IU] | Freq: Three times a day (TID) | SUBCUTANEOUS | Status: DC

## 2021-05-30 MED ORDER — ENOXAPARIN SODIUM 100 MG/ML IJ SOSY
100.0000 mg | PREFILLED_SYRINGE | Freq: Two times a day (BID) | INTRAMUSCULAR | Status: DC
  Administered 2021-05-30 – 2021-05-31 (×2): 100 mg via SUBCUTANEOUS
  Filled 2021-05-30 (×3): qty 1

## 2021-05-30 MED ORDER — ACETAMINOPHEN 500 MG PO TABS
1000.0000 mg | ORAL_TABLET | Freq: Once | ORAL | Status: AC
  Administered 2021-05-30: 1000 mg via ORAL
  Filled 2021-05-30: qty 2

## 2021-05-30 NOTE — MAU Note (Signed)
.  [name] is a 40 y.o. at 65w0dhere in MAU reporting: sent over by provider for Pre Eclampsia workup. She has had elevated BP over the last 2 weeks. States she has gained 20lbs since 05/16/21. Edema in BLE. Also having headache she rates 8/10 that she has treated with medication. Having blurry vision as well. Denies VB or LOF. Endorses good fetal movement. Patient is type 1 diabetic. Removed insulin pump this morning to change it but cbg was 57. Feeling like she is SOB and feeling "confused".  Pain score: 8 Vitals:   05/30/21 1123  BP: (!) 134/96  Pulse: 70  Resp: 15  Temp: 97.8 F (36.6 C)  SpO2: 100%     FHT:147 Lab orders placed from triage:  UA

## 2021-05-30 NOTE — Progress Notes (Signed)
Inpatient Diabetes Program Recommendations  Diabetes Treatment Program Recommendations  ADA Standards of Care 2018 Diabetes in Pregnancy Target Glucose Ranges:  Fasting: 60 - 90 mg/dL Preprandial: 60 - 105 mg/dL 1 hr postprandial: Less than '140mg'$ /dL (from first bite of meal) 2 hr postprandial: Less than 120 mg/dL (from first bite of meal)      Lab Results  Component Value Date   GLUCAP 230 (H) 05/30/2021   HGBA1C 6.9 (H) 05/30/2021    Review of Glycemic Control Results for HARLO, ERVINE (MRN [id number]) as of 05/30/2021 15:38  Ref. Range 05/30/2021 11:38  Glucose-Capillary Latest Ref Range: 70 - 99 mg/dL 230 (H)  Results for KIMBERL, RAISNER (MRN [id number]) as of 05/30/2021 16:15  Ref. Range 05/30/2021 12:24  Beta-Hydroxybutyric Acid Latest Ref Range: 0.05 - 0.27 mmol/L 1.70 (H)   Diabetes history: Type 1 DM Outpatient Diabetes medications: Omnipod insulin pump Prepregnancy: Lantus 60 units QHS, Novolog 15 units TID Current orders for Inpatient glycemic control: Novolog 20-22 units TID BMZ x 2  Inpatient Diabetes Program Recommendations:    Noted consult.   Spoke with patient to verify home insulin pump settings. Has been using Omnipod, however has not had the pump applied since 0715 this AM. Does not have all supplies available for reapplication. Verified settings:  Outpatient insulin pump settings are as follows: Basal insulin  12A 1.35 units/hour 5A 1.40 units/hour Total daily basal insulin: 33.25 units/24 hours Carb Coverage 1:12 1 unit for every 12 grams of carbohydrates Insulin Sensitivity 1:10 1 unit drops blood glucose 10 mg/dl Target Glucose Goals 0000-0000 100 mg/dl  Reviewed patient's current A1c of 6.9%. Explained what a A1c is and what it measures. Also reviewed goal A1c with patient, importance of good glucose control @ home, and blood sugar goals. Reviewed lab work, impact of BMZ with glucose levels, Endotool, procedure during delivery C/S vs vaginal,  vascular changes and commodities.  Patient just had Dexcom ordered but does not have one applied.  Encouraged significant other to collect all supplies for insulin pump in preparation for transition.  With BMZ, consider ordering IV insulin per Endotool. Dr Willis Modena in agreement and orders provided.  MD okay with using Freestyle Libre once off IV insulin.    Thanks, Bronson Curb, MSN, RNC-OB Diabetes Coordinator [phone] (8a-5p)

## 2021-05-30 NOTE — MAU Provider Note (Signed)
History     VG:[id number]  Arrival date and time: 05/30/21 1058    Chief Complaint  Patient presents with   Hypertension   Headache   Blurred Vision   Edema     HPI [name] is a 40 y.o. at 83w0dwith PMHx notable for DM1 on insulin pump, DVT diagnosed in this pregnancy on BID lovenox, fetal cardiac anomaly, who presents for multiple symptoms.   [facility](in media tab): no elevated BP's prior to last week  Review of discharge summary from last admission on 05/25/21: [facility] LE swelling, SOB, and HA. BP's remained mild range, she was diagnosed with COVID during that admission.   [facility] it was confirmed fetus has a R sided aortic arch when she began to feel very unwell She reports she has had an intense headache for the past two days that has not been responsive to fioricet She also endorses some worsening blurry vision BP has been elevated at home She currently feels short of breath and is having a pain under her R breast with deep breaths Also having nausea as well as frequent episodes of diarrhea She reports compliance with her lovenox with no missed doses of later, usually takes them around 11:00-11:30 She has been using her insulin pump but has not had it on this morning Denies vaginal bleeding or loss of fluid Good fetal movement Endorses some contractions that started in the last few hours   --/--/A POS (09/02 2258)  OB History     Gravida  5   Para  1   Term      Preterm  1   AB  3   Living  1      SAB  3   IAB      Ectopic      Multiple      Live Births              Past Medical History:  Diagnosis Date   Abscess of left axilla - PREVOTELLA BIVIA & Staph Coag Neg 07/12/2012   MODERATE PREVOTELLA BIVIA Note: BETA LACTAMASE NEGATIVE     Asthma    as a child   [place]     skin tag rt breast   Cellulitis 06/01/2014   RT INNER THIGH   Depression    hx   Di[place]    Diabetes mellitus    IDDM, Insulin pump; followed by Dr. JDelrae Rend  DVT (deep vein thrombosis) in pregnancy    GERD (gastroesophageal reflux disease)    no current meds.   Heart murmur    Hidradenitis 04/2012   bilat. thighs, left groin - open areas on thighs   Hx MRSA infection    Hx of blood clots    in left leg   Hydradenitis    PCOS (polycystic ovarian syndrome)    Pneumonia    hx   SVT (supra[place]ricular tachycardia) [place] 06/2017   TIA (transient ischemic attack) 2019   Vitamin D insufficiency     Past Surgical History:  Procedure Laterality Date   BREAST EXCISIONAL BIOPSY Right    BREAST SURGERY     right lumpectomy   BUNIONECTOMY     left   CARDIAC CATHETERIZATION     CHOLECYSTECTOMY  12/02/2006   lap. chole.   DILATION AND EVACUATION  02/14/2007  ESOPHAGOGASTRODUODENOSCOPY  11/17/2011   Procedure: ESOPHAGOGASTRODUODENOSCOPY (EGD);  Surgeon: [name], MD;  Location: [facility];  Service: Endoscopy;  Laterality: N/A;   EYE SURGERY     exc. stye left eye   HYDRADENITIS EXCISION  04/30/2012   Procedure: EXCISION HYDRADENITIS GROIN;  Surgeon: [name], MD;  Location: [place];  Service: General;  Laterality: Left;  wide excision hidradenitis bilateral thighs and Left groin   HYDRADENITIS EXCISION Left 01/13/2014   Procedure: WIDE EXCISION HIDRADENITIS LEFT AXILLA;  Surgeon: [name], MD;  Location: [place];  Service: General;  Laterality: Left;   INCISION AND DRAINAGE ABSCESS Right 03/17/2019   Procedure: INCISION AND DRAINAGE RIGHT THIGH ABSCESS;  Surgeon: [name], MD;  Location: [place];  Service: General;  Laterality: Right;   IRRIGATION AND DEBRIDEMENT ABSCESS Right 06/02/2014   Procedure: IRRIGATION AND DEBRIDEMENT ABSCESS;  Surgeon: [name], MD;  Location: [place];  Service: General;  Laterality: Right;    IRRIGATION AND DEBRIDEMENT ABSCESS Left 09/23/2014   Procedure: IRRIGATION AND DEBRIDEMENT ABSCESS/LEFT THIGH;  Surgeon: [name], MD;  Location: [place];  Service: General;  Laterality: Left;   LEFT HEART CATHETERIZATION WITH CORONARY ANGIOGRAM N/A 07/14/2014   Procedure: LEFT HEART CATHETERIZATION WITH CORONARY ANGIOGRAM;  Surgeon: [name], MD;  Location: [facility];  Service: Cardiovascular;  Laterality: N/A;   TONSILLECTOMY     WISDOM TOOTH EXTRACTION      Family History  Problem Relation Age of Onset   Cancer Mother    Anxiety disorder Mother    Depression Mother    Sexual abuse Mother    Breast cancer Mother 68   Hyperlipidemia Sister    Hypertension Sister    Sexual abuse Sister    Hypertension Father    Anxiety disorder Father    Depression Father    Drug abuse Father    Stroke Paternal Uncle    ADD / ADHD Paternal Uncle    Anxiety disorder Paternal Uncle    Anxiety disorder Maternal Aunt    Seizures Maternal Aunt    Anxiety disorder Paternal Aunt    Anxiety disorder Maternal Uncle    ADD / ADHD Cousin    ADD / ADHD Daughter     Social History   Socioeconomic History   Marital status: Single    Spouse name: Not on file   Number of children: Not on file   Years of education: Not on file   Highest education level: Not on file  Occupational History   Not on file  Tobacco Use   Smoking status: Every Day    Packs/day: 1.00    Years: 23.00    Pack years: 23.00    Types: Cigarettes   Smokeless tobacco: Never   Tobacco comments:    Has cut back to 1/2 to 3/4 and wants to begin useing patches again once anxiety subsides  Vaping Use   Vaping Use: Never used  Substance and Sexual Activity   Alcohol use: No    Alcohol/week: 0.0 standard drinks   Drug use: Yes    Types: Marijuana    Comment: 2020   Sexual activity: Yes    Partners: Male    Birth control/protection: I.U.D.  Other Topics Concern   Not on file  Social History Narrative   Not on  file   Social Determinants of Health   Financial Resource Strain: Not on file  Food Insecurity: Not on file  Transportation Needs: Not on  file  Physical Activity: Not on file  Stress: Not on file  Social Connections: Not on file  Intimate Partner Violence: Not on file    Allergies  Allergen Reactions   Latex Hives, Shortness Of Breath and Rash   Levofloxacin Shortness Of Breath and Rash   Moxifloxacin Shortness Of Breath and Rash   Oxycodone-Acetaminophen Shortness Of Breath, Swelling and Rash    NORCO/VICODIN OK   Peach [Prunus Persica] Hives and Shortness Of Breath   Potassium-Containing Compounds Other (See Comments)    IV ROUTE - CAUSES VEINS TO COLLAPS; Reports that it is undiluted K only   Prednisone Other (See Comments)    SEVERE ELEVATION OF BLOOD SUGAR. Able to tolerate 40 mg   Propoxyphene N-Acetaminophen Swelling    SWELLING OF FACE AND THROAT   Rosiglitazone Maleate Swelling    SWELLING OF FACE AND LEGS   Xolair [Omalizumab] Other (See Comments)    Rash and anaphylaxis   Adhesive [Tape] Hives, Itching and Rash   Morphine And Related Other (See Comments)    Causes hallucinations   Prozac [Fluoxetine Hcl] Other (See Comments)    Made her very aggressive    Chantix [Varenicline]     dreams   Citrullus Vulgaris Nausea And Vomiting    Facial swelling   Humira [Adalimumab]     Does not remember    Septra [Sulfamethoxazole-Trimethoprim]    Zyvox [Linezolid]     Edema    Cefaclor Rash   Keflex [Cephalexin] Diarrhea and Rash    REACTION: severe migraine   Promethazine Hcl Other (See Comments)    IV ROUTE ONLY - JITTERY FEELING. Patient reports that it is mild and she has used promethazine since then  PO tablet ok   Sulfadiazine Rash    No current facility-administered medications on file prior to encounter.   Current Outpatient Medications on File Prior to Encounter  Medication Sig Dispense Refill   acetaminophen (TYLENOL) 500 MG tablet Take 2 tablets  (1,000 mg total) by mouth every 6 (six) hours as needed for mild pain or fever.     butalbital-acetaminophen-caffeine (FIORICET) 50-325-40 MG tablet Take by mouth 2 (two) times daily as needed for headache.     enoxaparin (LOVENOX) 100 MG/ML injection Inject 100 mg into the skin.     HUMALOG 100 UNIT/ML injection 20-22 units pre-meal as per sliding scale 10 mL 11   hydroxyprogesterone caproate (MAKENA) 250 mg/mL OIL injection Inject 250 mg into the muscle once a week.     metoCLOPramide (REGLAN) 10 MG tablet Take 10 mg by mouth 4 (four) times daily.     ondansetron (ZOFRAN ODT) 8 MG disintegrating tablet Take 1 tablet (8 mg total) by mouth every 8 (eight) hours as needed for nausea or vomiting. 20 tablet 1   Prenatal Vit-Fe Fumarate-FA (PRENATAL MULTIVITAMIN) TABS tablet Take 1 tablet by mouth daily at 12 noon.     albuterol (PROVENTIL) (2.5 MG/3ML) 0.083% nebulizer solution Take 3 mLs (2.5 mg total) by nebulization every 6 (six) hours as needed for wheezing or shortness of breath. 75 mL 12   glucose blood ([employer] TEST STRIPS) test strip 1 each by Other route See admin instructions.      insulin glargine (LANTUS) 100 UNIT/ML injection INJECT 60 UNITS UNDER THE SKIN ONCE DAILY AS DIRECTED 60 mL 3   lamoTRIgine (LAMICTAL) 25 MG tablet Take 1 tablet (25 mg total) by mouth daily. After 14 days increase dose to one tablet twice a day (Patient not taking:  Reported on 05/14/2021) 30 tablet 1   mupirocin ointment (BACTROBAN) 2 % Apply 1 application topically daily as needed. (Patient not taking: Reported on 05/14/2021)  2   pantoprazole (PROTONIX) 40 MG tablet Take 1 tablet (40 mg total) by mouth daily. (Patient taking differently: Take 40 mg by mouth in the morning and at bedtime.) 90 tablet 1   sertraline (ZOLOFT) 25 MG tablet Take 1 tablet (25 mg total) by mouth daily. (Patient not taking: Reported on 05/14/2021) 15 tablet 1   spironolactone (ALDACTONE) 100 MG tablet Take 1 tablet (100 mg total) by mouth  daily. (Patient not taking: Reported on 05/14/2021) 30 tablet 3   traMADol (ULTRAM) 50 MG tablet Take 1 tablet (50 mg total) by mouth every 6 (six) hours as needed for moderate pain. (Patient not taking: Reported on 05/14/2021) 30 tablet 0     ROS Pertinent positives and negative per HPI, all others reviewed and negative  Physical Exam   BP 128/87   Pulse 69   Temp 97.8 F (36.6 C) (Oral)   Resp 15   LMP  (LMP Unknown)   SpO2 100%   Patient Vitals for the past 24 hrs:  BP Temp Temp src Pulse Resp SpO2  05/30/21 1345 128/87 -- -- 69 -- 100 %  05/30/21 1330 (!) 151/84 -- -- 64 -- 100 %  05/30/21 1318 (!) 144/84 -- -- 62 -- --  05/30/21 1300 (!) 149/77 -- -- 67 -- 100 %  05/30/21 1245 135/82 -- -- 62 -- 98 %  05/30/21 1240 (!) 152/80 -- -- 68 -- 100 %  05/30/21 1200 (!) 144/89 -- -- 71 -- 99 %  05/30/21 1145 139/82 -- -- 66 -- 100 %  05/30/21 1130 140/83 -- -- 62 -- 100 %  05/30/21 1123 (!) 134/96 97.8 F (36.6 C) Oral 70 15 100 %    Physical Exam Constitutional:      General: She is not in acute distress.    Appearance: She is ill-appearing. She is not toxic-appearing or diaphoretic.  Eyes:     General: No scleral icterus. Cardiovascular:     Rate and Rhythm: Normal rate and regular rhythm.     Heart sounds: Normal heart sounds. No murmur heard. Pulmonary:     Effort: Pulmonary effort is normal. No respiratory distress.     Breath sounds: Normal breath sounds. No wheezing or rales.  Abdominal:     General: Bowel sounds are normal.     Palpations: Abdomen is soft.     Comments: gravid  Musculoskeletal:        General: Swelling present.     Comments: Marked 2+ pitting edema to knees bilaterally  Skin:    General: Skin is warm and dry.  Neurological:     Mental Status: She is alert.     Cervical Exam Presentation: Vertex (by bedside US) Exam by:: Dr. Dione Plover  Bedside Ultrasound Pt informed that the ultrasound is considered a limited OB ultrasound and is not  intended to be a complete ultrasound exam.  Patient also informed that the ultrasound is not being completed with the intent of assessing for fetal or placental anomalies or any pelvic abnormalities.  Explained that the purpose of today's ultrasound is to assess for  presentation.  Patient acknowledges the purpose of the exam and the limitations of the study.    My interpretation: cephalic  FHT Baseline [id number], moderate variability, +accels, no decels Toco: irregular contractions and frequent irritability Cat: I  Labs  Results for orders placed or performed during the hospital encounter of 05/30/21 (from the past 24 hour(s))  Glucose, capillary     Status: Abnormal   Collection Time: 05/30/21 11:38 AM  Result Value Ref Range   Glucose-Capillary 230 (H) 70 - 99 mg/dL  Urinalysis, Routine w reflex microscopic Urine, Clean Catch     Status: Abnormal   Collection Time: 05/30/21 12:24 PM  Result Value Ref Range   Color, Urine YELLOW YELLOW   APPearance CLEAR CLEAR   Specific Gravity, Urine >1.030 (H) 1.005 - 1.030   pH 6.0 5.0 - 8.0   Glucose, UA >=500 (A) NEGATIVE mg/dL   Hgb urine dipstick NEGATIVE NEGATIVE   Bilirubin Urine MODERATE (A) NEGATIVE   Ketones, ur 40 (A) NEGATIVE mg/dL   Protein, ur >300 (A) NEGATIVE mg/dL   Nitrite NEGATIVE NEGATIVE   Leukocytes,Ua NEGATIVE NEGATIVE  CBC     Status: Abnormal   Collection Time: 05/30/21 12:24 PM  Result Value Ref Range   WBC 8.1 4.0 - 10.5 K/uL   RBC 4.48 3.87 - 5.11 MIL/uL   Hemoglobin 14.1 12.0 - 15.0 g/dL   HCT 41.7 36.0 - 46.0 %   MCV 93.1 80.0 - 100.0 fL   MCH 31.5 26.0 - 34.0 pg   MCHC 33.8 30.0 - 36.0 g/dL   RDW 12.8 11.5 - 15.5 %   Platelets 148 (L) 150 - 400 K/uL   nRBC 0.0 0.0 - 0.2 %  Comprehensive metabolic panel     Status: Abnormal   Collection Time: 05/30/21 12:24 PM  Result Value Ref Range   Sodium 134 (L) 135 - 145 mmol/L   Potassium 4.2 3.5 - 5.1 mmol/L   Chloride 105 98 - 111 mmol/L   CO2 19 (L) 22 - 32 mmol/L    Glucose, Bld 226 (H) 70 - 99 mg/dL   BUN 12 6 - 20 mg/dL   Creatinine, Ser 0.75 0.44 - 1.00 mg/dL   Calcium 8.3 (L) 8.9 - 10.3 mg/dL   Total Protein 5.5 (L) 6.5 - 8.1 g/dL   Albumin 2.3 (L) 3.5 - 5.0 g/dL   AST 28 15 - 41 U/L   ALT 24 0 - 44 U/L   Alkaline Phosphatase 141 (H) 38 - 126 U/L   Total Bilirubin 0.3 0.3 - 1.2 mg/dL   GFR, Estimated >60 >60 mL/min   Anion gap 10 5 - 15  Beta-hydroxybutyric acid     Status: Abnormal   Collection Time: 05/30/21 12:24 PM  Result Value Ref Range   Beta-Hydroxybutyric Acid 1.70 (H) 0.05 - 0.27 mmol/L  Hemoglobin A1c     Status: Abnormal   Collection Time: 05/30/21 12:24 PM  Result Value Ref Range   Hgb A1c MFr Bld 6.9 (H) 4.8 - 5.6 %   Mean Plasma Glucose 151.33 mg/dL  Magnesium     Status: Abnormal   Collection Time: 05/30/21 12:24 PM  Result Value Ref Range   Magnesium 1.6 (L) 1.7 - 2.4 mg/dL  Phosphorus     Status: None   Collection Time: 05/30/21 12:24 PM  Result Value Ref Range   Phosphorus 3.9 2.5 - 4.6 mg/dL  Lactic acid, plasma     Status: None   Collection Time: 05/30/21 12:24 PM  Result Value Ref Range   Lactic Acid, Venous 1.1 0.5 - 1.9 mmol/L  Urinalysis, Microscopic (reflex)     Status: Abnormal   Collection Time: 05/30/21 12:24 PM  Result Value Ref Range   RBC / HPF  0-5 0 - 5 RBC/hpf   WBC, UA 0-5 0 - 5 WBC/hpf   Bacteria, UA FEW (A) NONE SEEN   Squamous Epithelial / LPF NONE SEEN 0 - 5    Imaging DG Chest Portable 1 View  Result Date: 05/30/2021 CLINICAL DATA:  shortness of breath EXAM: PORTABLE CHEST 1 VIEW COMPARISON:  None. FINDINGS: The cardiomediastinal silhouette is within normal limits. No pleural effusion. No pneumothorax. No mass or consolidation. No acute osseous abnormality. IMPRESSION: No acute findings in the chest. No findings of consolidative pneumonia. Electronically Signed   By: Albin Felling M.D.   On: 05/30/2021 13:17    MAU Course  Procedures  Lab Orders         Urinalysis, Routine w reflex  microscopic Urine, Clean Catch         Glucose, capillary         CBC         Comprehensive metabolic panel         Beta-hydroxybutyric acid         Hemoglobin A1c         Magnesium         Phosphorus         Lactic acid, plasma         Protein / creatinine ratio, urine         Urinalysis, Microscopic (reflex)    Meds ordered this encounter  Medications   lactated ringers bolus 1,000 mL   lactated ringers bolus 1,000 mL   DISCONTD: enoxaparin (LOVENOX) injection 97.5 mg   ondansetron (ZOFRAN) 8 mg in sodium chloride 0.9 % 50 mL IVPB   cyclobenzaprine (FLEXERIL) tablet 10 mg   acetaminophen (TYLENOL) tablet 1,000 mg   DISCONTD: enoxaparin (LOVENOX) injection 100 mg   enoxaparin (LOVENOX) injection 100 mg    Imaging Orders         DG Chest Portable 1 View     MDM high  Assessment and Plan  #Pre-eclampsia with severe features by neurological symptoms Patient presenting with persistent mild range BP's, worsening proteinuria as of a week ago, persistent headache and blurry vision that did not respond to treatment in [facility]U, and decreasing platelet count. Picture is complicated by COVID diagnosis which could account for some but not all of the above, but overall most c/w severe PreE with recommendation to start magnesium prophylaxis and move towards delivery. Case discussed with on call [facility] physician Dr. Willis Modena who will assume care of the patient.  #COVID No improvement in symptoms. May benefit from course of Remdesivir as she is outside of window for Paxlovid.  #DM1 Defer management of hyperglycemia to Dr. Willis Modena. Ruled out for DKA no anion gap. Slightly elevated beta-hydroxybutyric acid but likely secondary to dehydration.   #DVT in pregnancy Ordered for dose of lovenox.  #FWB FHT Cat I NST: Reactive   Dispo: care turned over to Dr. Madison Hickman, MD/MPH 05/30/21 2:02 PM

## 2021-05-30 NOTE — H&P (Signed)
[name] is a 40 y.o. female, [place], EGA [redacted]W[redacted]D, [facility] 07-11-21 presenting for headache and possible elevated BP.  She had f/u fetal ECHO today, BP 130/90 there, she has had headache and visual changes all day.  She was admitted on 9-2 for + COVID and possibly elevated BP, normal labs except UPC 0.93, she was stable and d/c home 9-3 on no BP meds.  On eval in MAU today, BP labile 130-150/70-90.  [place] labs normal except platelets are 148k, were 184 k on 9-2, and glucose 226, lactic acid 1.1, BHA 1.70, Hgb A1c 6.9.    PNC complicated by:  -Type 1 DM, on insulin pump, but has been off pump today -DVT of LE at 29 weeks, on Lovenox 100 mg bid -obesity -Anxiety, on zoloft and lamictal -suspected fetal growth disorder, short long bones, ? Cardiac anomaly, MFM and peds cardiology following -AMA, NIPT low risk -Asthma -+GBS in urine   OB History     Gravida  5   Para  1   Term      Preterm  1   AB  3   Living  1      SAB  3   IAB      Ectopic      Multiple      Live Births             Past Medical History:  Diagnosis Date   Abscess of left axilla - PREVOTELLA BIVIA & Staph Coag Neg 07/12/2012   MODERATE PREVOTELLA BIVIA Note: BETA LACTAMASE NEGATIVE     Asthma    as a child   Cancer ([place])    skin tag rt breast   Cellulitis 06/01/2014   RT INNER THIGH   Depression    hx   Diabetes ([place])    Diabetes mellitus    IDDM, Insulin pump; followed by Dr. [name]   DVT (deep vein thrombosis) in pregnancy    GERD (gastroesophageal reflux disease)    no current meds.   Heart murmur    Hidradenitis 04/2012   bilat. thighs, left groin - open areas on thighs   Hx MRSA infection    Hx of blood clots    in left leg   Hydradenitis    PCOS (polycystic ovarian syndrome)    Pneumonia    hx   SVT (supraventricular tachycardia) ([place]) 06/2017   TIA (transient ischemic attack) 2019   Vitamin D insufficiency    Past Surgical History:  Procedure Laterality Date    BREAST EXCISIONAL BIOPSY Right    BREAST SURGERY     right lumpectomy   BUNIONECTOMY     left   CARDIAC CATHETERIZATION     CHOLECYSTECTOMY  12/02/2006   lap. chole.   DILATION AND EVACUATION  02/14/2007   ESOPHAGOGASTRODUODENOSCOPY  11/17/2011   Procedure: ESOPHAGOGASTRODUODENOSCOPY (EGD);  Surgeon: [name], MD;  Location: [facility];  Service: Endoscopy;  Laterality: N/A;   EYE SURGERY     exc. stye left eye   HYDRADENITIS EXCISION  04/30/2012   Procedure: EXCISION HYDRADENITIS GROIN;  Surgeon: [name], MD;  Location: [place];  Service: General;  Laterality: Left;  wide excision hidradenitis bilateral thighs and Left groin   HYDRADENITIS EXCISION Left 01/13/2014   Procedure: WIDE EXCISION HIDRADENITIS LEFT AXILLA;  Surgeon: [name], MD;  Location: [place];  Service: General;  Laterality: Left;   INCISION AND DRAINAGE ABSCESS Right 03/17/2019   Procedure: INCISION  AND DRAINAGE RIGHT THIGH ABSCESS;  Surgeon: [name], MD;  Location: [place];  Service: General;  Laterality: Right;   IRRIGATION AND DEBRIDEMENT ABSCESS Right 06/02/2014   Procedure: IRRIGATION AND DEBRIDEMENT ABSCESS;  Surgeon: [name], MD;  Location: [place];  Service: General;  Laterality: Right;   IRRIGATION AND DEBRIDEMENT ABSCESS Left 09/23/2014   Procedure: IRRIGATION AND DEBRIDEMENT ABSCESS/LEFT THIGH;  Surgeon: [name], MD;  Location: [place];  Service: General;  Laterality: Left;   LEFT HEART CATHETERIZATION WITH CORONARY ANGIOGRAM N/A 07/14/2014   Procedure: LEFT HEART CATHETERIZATION WITH CORONARY ANGIOGRAM;  Surgeon: [name], MD;  Location: [facility] C[facility] LAB;  Service: Cardiovascular;  Laterality: N/A;   TONSILLECTOMY     WISDOM TOOTH EXTRACTION     Family History: family history includes ADD / ADHD in her cousin, daughter, and paternal uncle; Anxiety disorder in her father, maternal aunt, maternal uncle, mother, paternal aunt, and paternal uncle; Breast  cancer (age of onset: 59) in her mother; Cancer in her mother; Depression in her father and mother; Drug abuse in her father; Hyperlipidemia in her sister; Hypertension in her father and sister; Seizures in her maternal aunt; Sexual abuse in her mother and sister; Stroke in her paternal uncle. Social History:  reports that she has been smoking cigarettes. She has a 23.00 pack-year smoking history. She has never used smokeless tobacco. She reports current drug use. Drug: Marijuana. She reports that she does not drink alcohol.     Maternal Diabetes: Yes:  Diabetes Type:  Insulin/Medication controlled Genetic Screening: Normal Maternal Ultrasounds/Referrals: Other: Fetal Ultrasounds or other Referrals:  Fetal echo, Referred to Materal Fetal Medicine  Maternal Substance Abuse:  No Significant Maternal Medications:  Meds include: Progesterone Zoloft Other:  Significant Maternal Lab Results:  Group B Strep positive Other Comments:   insulin, zoloft, lamictal, short long bones, ? Cardiac anomaly  Review of Systems  Eyes:  Positive for visual disturbance.  Respiratory:  Positive for cough and shortness of breath.   Cardiovascular: Negative.   Neurological:  Positive for headaches.  Maternal Medical History:  Fetal activity: Perceived fetal activity is normal.   Prenatal Complications - Diabetes: type 1. Diabetes is managed by insulin pump.    Exam by:: Dr. Dione Plover Blood pressure (!) 146/84, pulse 66, temperature 97.8 F (36.6 C), temperature source Oral, resp. rate 15, SpO2 98 %. Maternal Exam:  Uterine Assessment: Contraction strength is mild.  Contraction frequency is irregular.  Abdomen: Patient reports no abdominal tenderness.   Fetal Exam Fetal Monitor Review: Mode: ultrasound.   Baseline rate: 130.  Variability: moderate (6-25 bpm).   Pattern: accelerations present and no decelerations.    Physical Exam Vitals reviewed.  Constitutional:      Appearance: She is well-developed.   Cardiovascular:     Rate and Rhythm: Normal rate and regular rhythm.  Pulmonary:     Effort: Pulmonary effort is normal. No respiratory distress.  Abdominal:     Palpations: Abdomen is soft.  Neurological:     Mental Status: She is alert.    Prenatal labs: ABO, Rh: --/--/A POS (09/02 2258) Antibody: NEG (09/02 2258) Rubella:  immune RPR:   NR HBsAg:   neg HIV:   NR GBS:   pos  Assessment/Plan: IUP at [redacted]W[redacted]D with headache, visual changes, slightly elevated BP, possible early DKA, +COVID.  It is difficult to tell what is the source of her symptoms.  Diabetic coordinator helping with glucose control.  BP not elevated enough to treat, but  will monitor closely since symptoms are suspicious for preeclampsia, platelets lower over the past week but still 148k.   Will admit, sugar control with insulin and monitor labs for possible resolving early DKA, monitor for increasing BP, steroids for fetal lung maturity, might need to consider magnesium and delivery if symptoms do not improve or get worse.  COVID precautions   Blane Ohara Mieko Kneebone 05/30/2021, 6:13 PM

## 2021-05-30 NOTE — Plan of Care (Signed)
  Problem: Education: Goal: Knowledge of General Education information will improve Description: Including pain rating scale, medication(s)/side effects and non-pharmacologic comfort measures Outcome: Completed/Met

## 2021-05-31 DIAGNOSIS — F40240 Claustrophobia: Secondary | ICD-10-CM | POA: Diagnosis not present

## 2021-05-31 DIAGNOSIS — O1413 Severe pre-eclampsia, third trimester: Secondary | ICD-10-CM | POA: Diagnosis not present

## 2021-05-31 DIAGNOSIS — O99344 Other mental disorders complicating childbirth: Secondary | ICD-10-CM | POA: Diagnosis present

## 2021-05-31 DIAGNOSIS — O98513 Other viral diseases complicating pregnancy, third trimester: Secondary | ICD-10-CM | POA: Diagnosis not present

## 2021-05-31 DIAGNOSIS — Z302 Encounter for sterilization: Secondary | ICD-10-CM | POA: Diagnosis not present

## 2021-05-31 DIAGNOSIS — M79605 Pain in left leg: Secondary | ICD-10-CM | POA: Diagnosis not present

## 2021-05-31 DIAGNOSIS — O1414 Severe pre-eclampsia complicating childbirth: Secondary | ICD-10-CM | POA: Diagnosis not present

## 2021-05-31 DIAGNOSIS — F17210 Nicotine dependence, cigarettes, uncomplicated: Secondary | ICD-10-CM | POA: Diagnosis present

## 2021-05-31 DIAGNOSIS — O1410 Severe pre-eclampsia, unspecified trimester: Secondary | ICD-10-CM | POA: Diagnosis present

## 2021-05-31 DIAGNOSIS — O24013 Pre-existing diabetes mellitus, type 1, in pregnancy, third trimester: Secondary | ICD-10-CM

## 2021-05-31 DIAGNOSIS — E109 Type 1 diabetes mellitus without complications: Secondary | ICD-10-CM | POA: Diagnosis not present

## 2021-05-31 DIAGNOSIS — O99334 Smoking (tobacco) complicating childbirth: Secondary | ICD-10-CM | POA: Diagnosis present

## 2021-05-31 DIAGNOSIS — R059 Cough, unspecified: Secondary | ICD-10-CM | POA: Diagnosis present

## 2021-05-31 DIAGNOSIS — O358XX0 Maternal care for other (suspected) fetal abnormality and damage, not applicable or unspecified: Secondary | ICD-10-CM | POA: Diagnosis present

## 2021-05-31 DIAGNOSIS — F3181 Bipolar II disorder: Secondary | ICD-10-CM | POA: Diagnosis not present

## 2021-05-31 DIAGNOSIS — U071 COVID-19: Secondary | ICD-10-CM | POA: Diagnosis not present

## 2021-05-31 DIAGNOSIS — R197 Diarrhea, unspecified: Secondary | ICD-10-CM | POA: Diagnosis present

## 2021-05-31 DIAGNOSIS — J45909 Unspecified asthma, uncomplicated: Secondary | ICD-10-CM | POA: Diagnosis not present

## 2021-05-31 DIAGNOSIS — O9852 Other viral diseases complicating childbirth: Secondary | ICD-10-CM | POA: Diagnosis not present

## 2021-05-31 DIAGNOSIS — O99892 Other specified diseases and conditions complicating childbirth: Secondary | ICD-10-CM | POA: Diagnosis present

## 2021-05-31 DIAGNOSIS — Z3A34 34 weeks gestation of pregnancy: Secondary | ICD-10-CM

## 2021-05-31 DIAGNOSIS — O99324 Drug use complicating childbirth: Secondary | ICD-10-CM | POA: Diagnosis not present

## 2021-05-31 DIAGNOSIS — O99824 Streptococcus B carrier state complicating childbirth: Secondary | ICD-10-CM | POA: Diagnosis present

## 2021-05-31 DIAGNOSIS — O9952 Diseases of the respiratory system complicating childbirth: Secondary | ICD-10-CM | POA: Diagnosis present

## 2021-05-31 DIAGNOSIS — O99214 Obesity complicating childbirth: Secondary | ICD-10-CM | POA: Diagnosis not present

## 2021-05-31 DIAGNOSIS — O2402 Pre-existing diabetes mellitus, type 1, in childbirth: Secondary | ICD-10-CM | POA: Diagnosis not present

## 2021-05-31 DIAGNOSIS — R519 Headache, unspecified: Secondary | ICD-10-CM | POA: Diagnosis present

## 2021-05-31 DIAGNOSIS — Z23 Encounter for immunization: Secondary | ICD-10-CM | POA: Diagnosis not present

## 2021-05-31 LAB — COMPREHENSIVE METABOLIC PANEL
ALT: 22 U/L (ref 0–44)
AST: 29 U/L (ref 15–41)
Albumin: 1.8 g/dL — ABNORMAL LOW (ref 3.5–5.0)
Alkaline Phosphatase: 107 U/L (ref 38–126)
Anion gap: 8 (ref 5–15)
BUN: 16 mg/dL (ref 6–20)
CO2: 20 mmol/L — ABNORMAL LOW (ref 22–32)
Calcium: 8.8 mg/dL — ABNORMAL LOW (ref 8.9–10.3)
Chloride: 108 mmol/L (ref 98–111)
Creatinine, Ser: 0.79 mg/dL (ref 0.44–1.00)
GFR, Estimated: >60 mL/min (ref >60)
Glucose, Bld: 88 mg/dL (ref 70–99)
Potassium: 4 mmol/L (ref 3.5–5.1)
Sodium: 136 mmol/L (ref 135–145)
Total Bilirubin: 0.2 mg/dL — ABNORMAL LOW (ref 0.3–1.2)
Total Protein: 4.3 g/dL — ABNORMAL LOW (ref 6.5–8.1)

## 2021-05-31 LAB — CBC
HCT: 35.3 % — ABNORMAL LOW (ref 36.0–46.0)
Hemoglobin: 12.4 g/dL (ref 12.0–15.0)
MCH: 31.8 pg (ref 26.0–34.0)
MCHC: 35.1 g/dL (ref 30.0–36.0)
MCV: 90.5 fL (ref 80.0–100.0)
Platelets: 130 10*3/uL — ABNORMAL LOW (ref 150–400)
RBC: 3.9 MIL/uL (ref 3.87–5.11)
RDW: 12.8 % (ref 11.5–15.5)
WBC: 9.7 10*3/uL (ref 4.0–10.5)
nRBC: 0 % (ref 0.0–0.2)

## 2021-05-31 LAB — GLUCOSE, CAPILLARY
Glucose-Capillary: 101 mg/dL — ABNORMAL HIGH (ref 70–99)
Glucose-Capillary: 103 mg/dL — ABNORMAL HIGH (ref 70–99)
Glucose-Capillary: 104 mg/dL — ABNORMAL HIGH (ref 70–99)
Glucose-Capillary: 106 mg/dL — ABNORMAL HIGH (ref 70–99)
Glucose-Capillary: 108 mg/dL — ABNORMAL HIGH (ref 70–99)
Glucose-Capillary: 110 mg/dL — ABNORMAL HIGH (ref 70–99)
Glucose-Capillary: 113 mg/dL — ABNORMAL HIGH (ref 70–99)
Glucose-Capillary: 116 mg/dL — ABNORMAL HIGH (ref 70–99)
Glucose-Capillary: 119 mg/dL — ABNORMAL HIGH (ref 70–99)
Glucose-Capillary: 127 mg/dL — ABNORMAL HIGH (ref 70–99)
Glucose-Capillary: 129 mg/dL — ABNORMAL HIGH (ref 70–99)
Glucose-Capillary: 132 mg/dL — ABNORMAL HIGH (ref 70–99)
Glucose-Capillary: 139 mg/dL — ABNORMAL HIGH (ref 70–99)
Glucose-Capillary: 140 mg/dL — ABNORMAL HIGH (ref 70–99)
Glucose-Capillary: 144 mg/dL — ABNORMAL HIGH (ref 70–99)
Glucose-Capillary: 153 mg/dL — ABNORMAL HIGH (ref 70–99)
Glucose-Capillary: 159 mg/dL — ABNORMAL HIGH (ref 70–99)
Glucose-Capillary: 163 mg/dL — ABNORMAL HIGH (ref 70–99)
Glucose-Capillary: 183 mg/dL — ABNORMAL HIGH (ref 70–99)
Glucose-Capillary: 191 mg/dL — ABNORMAL HIGH (ref 70–99)
Glucose-Capillary: 70 mg/dL (ref 70–99)
Glucose-Capillary: 72 mg/dL (ref 70–99)
Glucose-Capillary: 74 mg/dL (ref 70–99)
Glucose-Capillary: 87 mg/dL (ref 70–99)

## 2021-05-31 LAB — BETA-HYDROXYBUTYRIC ACID
Beta-Hydroxybutyric Acid: 0.07 mmol/L (ref 0.05–0.27)
Beta-Hydroxybutyric Acid: 0.12 mmol/L (ref 0.05–0.27)

## 2021-05-31 MED ORDER — BUTORPHANOL TARTRATE 1 MG/ML IJ SOLN
1.0000 mg | Freq: Once | INTRAMUSCULAR | Status: AC
  Administered 2021-05-31: 1 mg via INTRAVENOUS
  Filled 2021-05-31: qty 1

## 2021-05-31 MED ORDER — LACTATED RINGERS IV BOLUS
500.0000 mL | Freq: Once | INTRAVENOUS | Status: AC
  Administered 2021-05-31: 500 mL via INTRAVENOUS

## 2021-05-31 MED ORDER — MAGNESIUM SULFATE BOLUS VIA INFUSION
4.0000 g | Freq: Once | INTRAVENOUS | Status: AC
  Administered 2021-05-31: 4 g via INTRAVENOUS
  Filled 2021-05-31: qty 1000

## 2021-05-31 MED ORDER — TRAMADOL HCL 50 MG PO TABS
50.0000 mg | ORAL_TABLET | Freq: Once | ORAL | Status: AC
  Administered 2021-05-31: 50 mg via ORAL
  Filled 2021-05-31: qty 1

## 2021-05-31 MED ORDER — MAGNESIUM SULFATE 40 GM/1000ML IV SOLN
1.0000 g/h | INTRAVENOUS | Status: AC
  Administered 2021-06-01: 2 g/h via INTRAVENOUS
  Administered 2021-06-02: 1 g/h via INTRAVENOUS
  Filled 2021-05-31 (×3): qty 1000

## 2021-05-31 MED ORDER — HYDRALAZINE HCL 20 MG/ML IJ SOLN: 10.0000 mg | INTRAMUSCULAR | Status: DC | PRN

## 2021-05-31 MED ORDER — LABETALOL HCL 5 MG/ML IV SOLN: 80.0000 mg | INTRAVENOUS | Status: DC | PRN

## 2021-05-31 MED ORDER — LABETALOL HCL 5 MG/ML IV SOLN: 40.0000 mg | INTRAVENOUS | Status: DC | PRN

## 2021-05-31 MED ORDER — LABETALOL HCL 5 MG/ML IV SOLN: 20.0000 mg | INTRAVENOUS | Status: DC | PRN

## 2021-05-31 NOTE — Consult Note (Signed)
MFM Consultation.  [name] is a 40 yo G5P1 who is 30 w 1 d who is admitted to the hospital for concerns for preeclampsia with severe features. She dated by LMP consistent with a 12 w 0 d ultrasound.   She is seen to day at the request of Dr. Irene Pap.  [name] is overall feeling poorly with nausea and significant headache unresolved with tylenol. She reports that she is not one that has headaches. She has had a migraine before and she feels that this is different.  Her pregnancy issues include:  1) Type 1 DM and preeclampsia [name] has known T1DM on an insulin pump that was recently shut off by [name]. She presented to the hospital yesterday with blood glucose levels of 226 mg/dL. DKA was evaluated with an initially elevated beta-hydroxybutyric acid of 1.70 but is now normal. She has a normal lactic acid.  She was placed on an insulin drip in conjunction with betamethasone administration, her blood sugars have been < 140-150 and as off 11:30 am 87 mg/dL.   Her preeclampsia labs are demonstrated falling platelets but greater than 100. 7d ago they were 182, then 148 and now 130. Dr. Brien Mates is trending serial labs but is on alert for HELLP syndrome.   Her UPC is 1.19 but there is a reported early 24 hour urine > 300 mg/dL Her Cr and LFT's are normal.   Blood pressure max is 150/80 mmHg. IV antihypertensive therapy has not been necessary.   2) History of DVT in lower extremity at 29 weeks currently on therapeutic Lovenox with the last dose given at 1 am.  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 Prior fetal echocardiogram was noted to be normal   4) Asymptomatic Covid diagnosed on 9/2- mild symptoms  5) Prior preterm delivery -on IM progesterone therapy weekly.  Vitals with BMI 05/31/2021 05/31/2021 05/31/2021  Height - - -  Weight - - -  BMI - - -  Systolic [id number]  Diastolic 79 74 74  Pulse 72 71 64   Some encounter information is confidential and restricted. Go to Review Flowsheets activity to see all data.   CMP Latest Ref Rng & Units 05/31/2021 05/30/2021 05/24/2021  Glucose 70 - 99 mg/dL 88 226(H) 141(H)  BUN 6 - 20 mg/dL '16 12 9  '$ Creatinine 0.44 - 1.00 mg/dL 0.79 0.75 0.70  Sodium 135 - 145 mmol/L 136 134(L) 135  Potassium 3.5 - 5.1 mmol/L 4.0 4.2 3.4(L)  Chloride 98 - 111 mmol/L 108 105 109  CO2 22 - 32 mmol/L 20(L) 19(L) 20(L)  Calcium 8.9 - 10.3 mg/dL 8.8(L) 8.3(L) 8.3(L)  Total Protein 6.5 - 8.1 g/dL 4.3(L) 5.5(L) 5.1(L)  Total Bilirubin 0.3 - 1.2 mg/dL 0.2(L) 0.3 0.4  Alkaline Phos 38 - 126 U/L 107 141(H) 105  AST 15 - 41 U/L 29 28 38  ALT 0 - 44 U/L '22 24 30   '$ CBC Latest Ref Rng & Units 05/31/2021 05/30/2021 05/24/2021  WBC 4.0 - 10.5 K/uL 9.7 8.1 7.6  Hemoglobin 12.0 - 15.0 g/dL 12.4 14.1 13.3  Hematocrit 36.0 - 46.0 % 35.3(L) 41.7 39.0  Platelets 150 - 400 K/uL 130(L) 148(L) 182   OB History  Gravida Para Term Preterm AB Living  5 1 0 '1 3 1  '$ SAB IAB Ectopic Multiple Live Births  3 0 0 0 0    # Outcome Date GA Lbr Len/2nd Weight  Sex Delivery Anes PTL Lv  5 Current           4 Preterm     F Vag-Spont     3 SAB           2 SAB           1 SAB                Family History  Problem Relation Age of Onset   Cancer Mother    Anxiety disorder Mother    Depression Mother    Sexual abuse Mother    Breast cancer Mother 12   Hyperlipidemia Sister    Hypertension Sister    Sexual abuse Sister    Hypertension Father    Anxiety disorder Father    Depression Father    Drug abuse Father    Stroke Paternal Uncle    ADD / ADHD Paternal Uncle    Anxiety disorder Paternal Uncle    Anxiety disorder Maternal Aunt    Seizures Maternal Aunt    Anxiety disorder Paternal Aunt    Anxiety disorder Maternal Uncle    ADD / ADHD Cousin    ADD / ADHD Daughter     Current Facility-Administered Medications (Endocrine & Metabolic):    betamethasone acetate-betamethasone sodium  phosphate (CELESTONE) injection 12 mg   insulin regular, human (MYXREDLIN) 100 units/ 100 mL infusion   Current Facility-Administered Medications (Cardiovascular):    [DISCONTINUED] labetalol (NORMODYNE) injection 20 mg **AND** [DISCONTINUED] labetalol (NORMODYNE) injection 40 mg **AND** [DISCONTINUED] labetalol (NORMODYNE) injection 80 mg **AND** hydrALAZINE (APRESOLINE) injection 10 mg **AND** Measure blood pressure   labetalol (NORMODYNE) injection 20 mg **AND** labetalol (NORMODYNE) injection 40 mg **AND** labetalol (NORMODYNE) injection 80 mg **AND** hydrALAZINE (APRESOLINE) injection 10 mg **AND** Measure blood pressure   Current Facility-Administered Medications (Respiratory):    albuterol (VENTOLIN HFA) 108 (90 Base) MCG/ACT inhaler 2 puff   Current Facility-Administered Medications (Analgesics):    acetaminophen (TYLENOL) tablet 650 mg   butalbital-acetaminophen-caffeine (FIORICET) 50-325-40 MG per tablet 1 tablet     Current Facility-Administered Medications (Other):    0.9 %  sodium chloride infusion   calcium carbonate (TUMS - dosed in mg elemental calcium) chewable tablet 400 mg of elemental calcium   dextrose 5 % in lactated ringers infusion   dextrose 50 % solution 0-50 mL   docusate sodium (COLACE) capsule 100 mg   lactated ringers infusion   magnesium sulfate 40 grams in SWI 1000 mL OB infusion   metoCLOPramide (REGLAN) tablet 10 mg   ondansetron (ZOFRAN-ODT) disintegrating tablet 8 mg   pantoprazole (PROTONIX) EC tablet 40 mg   prenatal multivitamin tablet 1 tablet   sodium chloride flush (NS) 0.9 % injection 3 mL   sodium chloride flush (NS) 0.9 % injection 3 mL   zolpidem (AMBIEN) tablet 5 mg  No current outpatient medications on file. Social History   Socioeconomic History   Marital status: Single    Spouse name: Not on file   Number of children: Not on file   Years of education: Not on file   Highest education level: Not on file  Occupational History    Not on file  Tobacco Use   Smoking status: Every Day    Packs/day: 1.00    Years: 23.00    Pack years: 23.00    Types: Cigarettes   Smokeless tobacco: Never   Tobacco comments:    Has cut back to 1/2 to 3/4 and wants to begin useing patches  again once anxiety subsides  Vaping Use   Vaping Use: Never used  Substance and Sexual Activity   Alcohol use: No    Alcohol/week: 0.0 standard drinks   Drug use: Yes    Types: Marijuana    Comment: 2020   Sexual activity: Yes    Partners: Male    Birth control/protection: I.U.D.  Other Topics Concern   Not on file  Social History Narrative   Not on file   Social Determinants of Health   Financial Resource Strain: Not on file  Food Insecurity: Not on file  Transportation Needs: Not on file  Physical Activity: Not on file  Stress: Not on file  Social Connections: Not on file  Intimate Partner Violence: Not on file   Allergies  Allergen Reactions   Latex Hives, Shortness Of Breath and Rash   Levofloxacin Shortness Of Breath and Rash   Moxifloxacin Shortness Of Breath and Rash   Oxycodone-Acetaminophen Shortness Of Breath, Swelling and Rash    NORCO/VICODIN OK   Peach [Prunus Persica] Hives and Shortness Of Breath   Potassium-Containing Compounds Other (See Comments)    IV ROUTE - CAUSES VEINS TO COLLAPS; Reports that it is undiluted K only   Prednisone Other (See Comments)    SEVERE ELEVATION OF BLOOD SUGAR. Able to tolerate 40 mg   Propoxyphene N-Acetaminophen Swelling    SWELLING OF FACE AND THROAT   Rosiglitazone Maleate Swelling    SWELLING OF FACE AND LEGS   Xolair [Omalizumab] Other (See Comments)    Rash and anaphylaxis   Adhesive [Tape] Hives, Itching and Rash   Morphine And Related Other (See Comments)    Causes hallucinations   Prozac [Fluoxetine Hcl] Other (See Comments)    Made her very aggressive    Chantix [Varenicline]     dreams   Citrullus Vulgaris Nausea And Vomiting    Facial swelling   Humira  [Adalimumab]     Does not remember    Septra [Sulfamethoxazole-Trimethoprim]    Zyvox [Linezolid]     Edema    Cefaclor Rash   Keflex [Cephalexin] Diarrhea and Rash    REACTION: severe migraine   Promethazine Hcl Other (See Comments)    IV ROUTE ONLY - JITTERY FEELING. Patient reports that it is mild and she has used promethazine since then  PO tablet ok   Sulfadiazine Rash   Imaging:  Most recent fetal anatomy was performed on 08/23 at 27 w 5 d with an EFW 52% suspected right aortic arch due to abnormal 3vv  Impression/Counseling:  I discussed with [name] the above history. I reviewed the diagnosis, evaluation and management of preeclampsia with severe features.  I discussed that preeclampsia with severe features is based on 2 blood pressure > 160/110 mmHg, or elevated BP > 140/90 mmHg with abnormal labs, or CNS symptoms. [name] had not had BP's > 160/110 however she has a persistent headache that has not resolved with tyelenol. She also has elevated proteinuria which is not a requirement for diagnosing preeclampsia with severe features.  We discussed the increased risk for maternal stroke, placental abruption, stillbirth and fetal growth restriction and death in women who go undelivered.   Given that she is 34 weeks she with persistent headache she is not a candidate for expectant management. Therefore we recommend delivery.  We have discontinued her Lovenox and she is nearly 12 hours since her last dose. ACOG recommends being 24 hours after last dose before dose for neuoraxial anesthesia and  planned IOL.   A cesarean should be performed for obstetric indications.  Magnesium sulfate should be initiated for seizure prophylaxis.  Continue to maintain blood pressure < 150/105 mmHg.  Continue insulin drip for glucose management per protocol  NICU consult to discuss concerns regarding possible right aortic arch.  I discussed this plan of care with Dr. Brien Mates.  I spent  60 minutes with > 50% in face to face consultation  All questions answered  Vikki Ports, MD

## 2021-05-31 NOTE — Progress Notes (Signed)
Inpatient Diabetes Program Recommendations  Diabetes Treatment Program Recommendations  ADA Standards of Care 2018 Diabetes in Pregnancy Target Glucose Ranges:  Fasting: 60 - 90 mg/dL Preprandial: 60 - 105 mg/dL 1 hr postprandial: Less than '140mg'$ /dL (from first bite of meal) 2 hr postprandial: Less than 120 mg/dL (from first bite of meal)      Lab Results  Component Value Date   GLUCAP 116 (H) 05/31/2021   HGBA1C 6.9 (H) 05/30/2021    Review of Glycemic Control Results for BRYNLEA, KREISHER (MRN [id number]) as of 05/31/2021 10:38  Ref. Range 05/31/2021 07:36 05/31/2021 08:36 05/31/2021 09:28 05/31/2021 10:27  Glucose-Capillary Latest Ref Range: 70 - 99 mg/dL 132 (H) 119 (H) 144 (H) 116 (H)   Diabetes history: Type 1 DM Outpatient Diabetes medications: Omnipod insulin pump Prepregnancy: Lantus 60 units QHS, Novolog 15 units TID Current orders for Inpatient glycemic control: iv insulin  BMZ x 2   Inpatient Diabetes Program Recommendations:    Consider changing diet to regular diet.   Continue with IV insulin.  Thanks, Bronson Curb, MSN, RNC-OB Diabetes Coordinator [phone] (8a-5p)

## 2021-05-31 NOTE — Progress Notes (Signed)
[name] 40 y.o. [phone] at 75w1dHD#2 admitted with persistent headache, mild range blood pressures, poorly controlled T1DM  S: Reports persistent headache overnight which was not improved with tylenol, fioricet, reglan, stadol. Reports floaters in vision. Reports pain under her right breast. She is concerned about her kidney function. She reports increased overall swelling.   Baby is moving well. She denies contractions, LOF or VB.   O: Vitals:   05/31/21 0833 05/31/21 1133 05/31/21 1209 05/31/21 1219  BP: 131/71 131/74 (!) 153/74 129/79  Pulse: 76 64 71 72  Resp: '19 18 20 20  '$ Temp: 97.7 F (36.5 C) 97.8 F (36.6 C)    TempSrc: Oral     SpO2:   100%   Height:       Gen: overall edema, no acute distress CVS: RRR Lungs: CTAB Abd: gravid, soft, nontender, mild tenderness to palpation right lower ribs Ext: 2+ pitting edema bilateral lower extremities  NST: baseline 135bpm, moderate variability, + accels, no decels Toco: acontractile  A/p: Barbara DEPETRO89y.o. [phone] 321w1dD#2, with severe preeclampsia Severe preeclampsia: diagnosis made by persistent headache and mild range blood pressures. She has proteinuria at baseline. Platelets downtrending but >100k. Normal LFTs and Cr. She has had poor urine output since admission and is becoming increasingly edematous. I spoke with Dr. BoGertie ExonFM, who will consult, he agrees with moving toward delivery       - Start IV magnesium 4g bolus followed by 2g/hr maintenance       - Monitor Bps (have not required treatment) 2. T1DM: borderline DKA yesterday on admission, now much better control on insulin drip. Diabetes RN following.  3. H/o DVT at 29 weeks: has been on Lovenox '100mg'$  BID. Her last dose was at 0147 this morning. Holding future doses for now. Patient aware she may not be able to have an epidural if <24 hours from last lovenox dose.  4. COVID+ : day 7, mild cough but no shortness of breath. Symptoms stable 5. H/o preterm  delivery at 31 weeks: has been on IM progesterone 6. Fetal cardiac right aortic arch suspected on scan on 8/23 with MFM. [facility] pending. I [facility] 7. Preterm: s/p first betamethasone dose yesterday at 1601, will receive 2nd dose this afternoon 8. MOD: vertex in MAU yesterday, will recheck. If remains stable with mild range Bps and on mag, will delay delivery slightly per MFM recommendation given her last Lovenox dose at 0147 this morning. If still vertex, will move toward IOL, with plan to start this evening around 8pm. If breech, would plan c/s 24 hours after Lovenox dose.    MiRowland Lathe9/09/22 12:34 PM

## 2021-05-31 NOTE — Progress Notes (Signed)
Vertex on bedside US.  Will plan to proceed with IOL this evening given she remains clinically stable with normal to mild range Bps, on magnesium.  GBS+ in prenatal urine culture, will need PCN. Desires bilateral salpingectomy if cesarean indicated, [id number] certain she does not want any more children. Will have her sign Medicaid sterilization form.   Luther Redo, MD 05/31/21 5:29 PM

## 2021-05-31 NOTE — Progress Notes (Signed)
Awaiting available room on L&D for IOL due to limited staffing.  Patient stable with normal to mild range Bps, on IV mag.  Fetal monitoring reassuring with moderate variability, + accels, no decels   M. Brien Mates, MD 05/31/21 9:52 PM

## 2021-05-31 NOTE — Discharge Instructions (Signed)
Carbohydrate Counting For People With Diabetes  Foods with carbohydrates make your blood glucose level go up. Learning how to count carbohydrates can help you control your blood glucose levels. First, identify the foods you eat that contain carbohydrates. Then, using the Foods with Carbohydrates chart, determine about how much carbohydrates are in your meals and snacks. Make sure you are eating foods with fiber, protein, and healthy fat along with your carbohydrate foods. Foods with Carbohydrates The following table shows carbohydrate foods that have about 15 grams of carbohydrate each. Using measuring cups, spoons, or a food scale when you first begin learning about carbohydrate counting can help you learn about the portion sizes you typically eat. The following foods have 15 grams carbohydrate each:  Grains 1 slice bread (1 ounce)  1 small tortilla (6-inch size)   large bagel (1 ounce)  1/3 cup pasta or rice (cooked)   hamburger or hot dog bun ( ounce)   cup cooked cereal   to  cup ready-to-eat cereal  2 taco shells (5-inch size) Fruit 1 small fresh fruit ( to 1 cup)   medium banana  17 small grapes (3 ounces)  1 cup melon or berries   cup canned or frozen fruit  2 tablespoons dried fruit (blueberries, cherries, cranberries, raisins)   cup unsweetened fruit juice  Starchy Vegetables  cup cooked beans, peas, corn, potatoes/sweet potatoes   large baked potato (3 ounces)  1 cup acorn or butternut squash  Snack Foods 3 to 6 crackers  8 potato chips or 13 tortilla chips ( ounce to 1 ounce)  3 cups popped popcorn  Dairy 3/4 cup (6 ounces) nonfat plain yogurt, or yogurt with sugar-free sweetener  1 cup milk  1 cup plain rice, soy, coconut or flavored almond milk Sweets and Desserts  cup ice cream or frozen yogurt  1 tablespoon jam, jelly, pancake syrup, table sugar, or honey  2 tablespoons light pancake syrup  1 inch square of frosted cake or 2 inch square of unfrosted  cake  2 small cookies (2/3 ounce each) or  large cookie  Sometimes you'll have to estimate carbohydrate amounts if you don't know the exact recipe. One cup of mixed foods like soups can have 1 to 2 carbohydrate servings, while some casseroles might have 2 or more servings of carbohydrate. Foods that have less than 20 calories in each serving can be counted as "free" foods. Count 1 cup raw vegetables, or  cup cooked non-starchy vegetables as "free" foods. If you eat 3 or more servings at one meal, then count them as 1 carbohydrate serving.  Foods without Carbohydrates  Not all foods contain carbohydrates. Meat, some dairy, fats, non-starchy vegetables, and many beverages don't contain carbohydrate. So when you count carbohydrates, you can generally exclude chicken, pork, beef, fish, seafood, eggs, tofu, cheese, butter, sour cream, avocado, nuts, seeds, olives, mayonnaise, water, black coffee, unsweetened tea, and zero-calorie drinks. Vegetables with no or low carbohydrate include green beans, cauliflower, tomatoes, and onions. How much carbohydrate should I eat at each meal?  Carbohydrate counting can help you plan your meals and manage your weight. Following are some starting points for carbohydrate intake at each meal. Work with your registered dietitian nutritionist to find the best range that works for your blood glucose and weight.   To Lose Weight To Maintain Weight  Women 2 - 3 carb servings 3 - 4 carb servings : 45-60 g  Men 3 - 4 carb servings 4 - 5 carb servings  Checking your blood glucose after meals will help you know if you need to adjust the timing, type, or number of carbohydrate servings in your meal plan. Achieve and keep a healthy body weight by balancing your food intake and physical activity.  Tips How should I plan my meals?  Plan for half the food on your plate to include non-starchy vegetables, like salad greens, broccoli, or carrots. Try to eat 3 to 5 servings of non-starchy  vegetables every day. Have a protein food at each meal. Protein foods include chicken, fish, meat, eggs, or beans (note that beans contain carbohydrate). These two food groups (non-starchy vegetables and proteins) are low in carbohydrate. If you fill up your plate with these foods, you will eat less carbohydrate but still fill up your stomach. Try to limit your carbohydrate portion to  of the plate.  What fats are healthiest to eat?  Diabetes increases risk for heart disease. To help protect your heart, eat more healthy fats, such as olive oil, nuts, and avocado. Eat less saturated fats like butter, cream, and high-fat meats, like bacon and sausage. Avoid trans fats, which are in all foods that list "partially hydrogenated oil" as an ingredient. What should I drink?  Choose drinks that are not sweetened with sugar. The healthiest choices are water, carbonated or seltzer waters, and tea and coffee without added sugars.  Sweet drinks will make your blood glucose go up very quickly. One serving of soda or energy drink is  cup. It is best to drink these beverages only if your blood glucose is low.  Artificially sweetened, or diet drinks, typically do not increase your blood glucose if they have zero calories in them. Read labels of beverages, as some diet drinks do have carbohydrate and will raise your blood glucose. Label Reading Tips Read Nutrition Facts labels to find out how many grams of carbohydrate are in a food you want to eat. Don't forget: sometimes serving sizes on the label aren't the same as how much food you are going to eat, so you may need to calculate how much carbohydrate is in the food you are serving yourself.   Carbohydrate Counting for People with Diabetes Sample 1-Day Menu  Breakfast  cup yogurt, low fat, low sugar (1 carbohydrate serving)   cup cereal, ready-to-eat, unsweetened (1 carbohydrate serving)  1 cup strawberries (1 carbohydrate serving)   cup almonds ( carbohydrate  serving)  Lunch 1, 5 ounce can chunk light tuna  2 ounces cheese, low fat cheddar  6 whole wheat crackers (1 carbohydrate serving)  1 small apple (1 carbohydrate servings)   cup carrots ( carbohydrate serving)   cup snap peas  1 cup 1% milk (1 carbohydrate serving)   Evening Meal Stir fry made with: 3 ounces chicken  1 cup brown rice (3 carbohydrate servings)   cup broccoli ( carbohydrate serving)   cup green beans   cup onions  1 tablespoon olive oil  2 tablespoons teriyaki sauce ( carbohydrate serving)  Evening Snack 1 extra small banana (1 carbohydrate serving)  1 tablespoon peanut butter   Carbohydrate Counting for People with Diabetes Vegan Sample 1-Day Menu  Breakfast 1 cup cooked oatmeal (2 carbohydrate servings)   cup blueberries (1 carbohydrate serving)  2 tablespoons flaxseeds  1 cup soymilk fortified with calcium and vitamin D  1 cup coffee  Lunch 2 slices whole wheat bread (2 carbohydrate servings)   cup baked tofu   cup lettuce  2 slices tomato  2  slices avocado   cup baby carrots ( carbohydrate serving)  1 orange (1 carbohydrate serving)  1 cup soymilk fortified with calcium and vitamin D   Evening Meal Burrito made with: 1 6-inch corn tortilla (1 carbohydrate serving)  1 cup refried vegetarian beans (2 carbohydrate servings)   cup chopped tomatoes   cup lettuce   cup salsa  1/3 cup brown rice (1 carbohydrate serving)  1 tablespoon olive oil for rice   cup zucchini   Evening Snack 6 small whole grain crackers (1 carbohydrate serving)  2 apricots ( carbohydrate serving)   cup unsalted peanuts ( carbohydrate serving)    Carbohydrate Counting for People with Diabetes Vegetarian (Lacto-Ovo) Sample 1-Day Menu  Breakfast 1 cup cooked oatmeal (2 carbohydrate servings)   cup blueberries (1 carbohydrate serving)  2 tablespoons flaxseeds  1 egg  1 cup 1% milk (1 carbohydrate serving)  1 cup coffee  Lunch 2 slices whole wheat bread (2  carbohydrate servings)  2 ounces low-fat cheese   cup lettuce  2 slices tomato  2 slices avocado   cup baby carrots ( carbohydrate serving)  1 orange (1 carbohydrate serving)  1 cup unsweetened tea  Evening Meal Burrito made with: 1 6-inch corn tortilla (1 carbohydrate serving)   cup refried vegetarian beans (1 carbohydrate serving)   cup tomatoes   cup lettuce   cup salsa  1/3 cup brown rice (1 carbohydrate serving)  1 tablespoon olive oil for rice   cup zucchini  1 cup 1% milk (1 carbohydrate serving)  Evening Snack 6 small whole grain crackers (1 carbohydrate serving)  2 apricots ( carbohydrate serving)   cup unsalted peanuts ( carbohydrate serving)    Copyright 2020  Academy of [employer]etics. All rights reserved.  Using Nutrition Labels: Carbohydrate  Serving Size  Look at the serving size. All the information on the label is based on this portion. Servings Per Container  The number of servings contained in the package. Guidelines for Carbohydrate  Look at the total grams of carbohydrate in the serving size.  1 carbohydrate choice = 15 grams of carbohydrate. Range of Carbohydrate Grams Per Choice  Carbohydrate Grams/Choice Carbohydrate Choices  6-10   11-20 1  21-25 1  26-35 2  36-40 2  41-50 3  51-55 3  56-65 4  66-70 4  71-80 5    Copyright 2020  Academy of [employer] and Dietetics. All rights reserved.

## 2021-06-01 ENCOUNTER — Other Ambulatory Visit: Payer: Self-pay

## 2021-06-01 ENCOUNTER — Encounter (HOSPITAL_COMMUNITY): Payer: Self-pay | Admitting: Obstetrics and Gynecology

## 2021-06-01 ENCOUNTER — Inpatient Hospital Stay (HOSPITAL_COMMUNITY): Payer: Medicaid Other | Admitting: Anesthesiology

## 2021-06-01 LAB — COMPREHENSIVE METABOLIC PANEL
ALT: 28 U/L (ref 0–44)
AST: 37 U/L (ref 15–41)
Albumin: 2 g/dL — ABNORMAL LOW (ref 3.5–5.0)
Alkaline Phosphatase: 113 U/L (ref 38–126)
Anion gap: 5 (ref 5–15)
BUN: 11 mg/dL (ref 6–20)
CO2: 18 mmol/L — ABNORMAL LOW (ref 22–32)
Calcium: 6.8 mg/dL — ABNORMAL LOW (ref 8.9–10.3)
Chloride: 110 mmol/L (ref 98–111)
Creatinine, Ser: 0.74 mg/dL (ref 0.44–1.00)
GFR, Estimated: >60 mL/min (ref >60)
Glucose, Bld: 117 mg/dL — ABNORMAL HIGH (ref 70–99)
Potassium: 3.8 mmol/L (ref 3.5–5.1)
Sodium: 133 mmol/L — ABNORMAL LOW (ref 135–145)
Total Bilirubin: 0.4 mg/dL (ref 0.3–1.2)
Total Protein: 4.8 g/dL — ABNORMAL LOW (ref 6.5–8.1)

## 2021-06-01 LAB — BASIC METABOLIC PANEL
Anion gap: 8 (ref 5–15)
BUN: 13 mg/dL (ref 6–20)
CO2: 18 mmol/L — ABNORMAL LOW (ref 22–32)
Calcium: 7.4 mg/dL — ABNORMAL LOW (ref 8.9–10.3)
Chloride: 107 mmol/L (ref 98–111)
Creatinine, Ser: 0.81 mg/dL (ref 0.44–1.00)
GFR, Estimated: >60 mL/min (ref >60)
Glucose, Bld: 99 mg/dL (ref 70–99)
Potassium: 3.5 mmol/L (ref 3.5–5.1)
Sodium: 133 mmol/L — ABNORMAL LOW (ref 135–145)

## 2021-06-01 LAB — MAGNESIUM
Magnesium: >21 mg/dL (ref 1.7–2.4)
Magnesium: 4 mg/dL — ABNORMAL HIGH (ref 1.7–2.4)

## 2021-06-01 LAB — GLUCOSE, CAPILLARY
Glucose-Capillary: 103 mg/dL — ABNORMAL HIGH (ref 70–99)
Glucose-Capillary: 104 mg/dL — ABNORMAL HIGH (ref 70–99)
Glucose-Capillary: 104 mg/dL — ABNORMAL HIGH (ref 70–99)
Glucose-Capillary: 105 mg/dL — ABNORMAL HIGH (ref 70–99)
Glucose-Capillary: 107 mg/dL — ABNORMAL HIGH (ref 70–99)
Glucose-Capillary: 109 mg/dL — ABNORMAL HIGH (ref 70–99)
Glucose-Capillary: 112 mg/dL — ABNORMAL HIGH (ref 70–99)
Glucose-Capillary: 112 mg/dL — ABNORMAL HIGH (ref 70–99)
Glucose-Capillary: 125 mg/dL — ABNORMAL HIGH (ref 70–99)
Glucose-Capillary: 136 mg/dL — ABNORMAL HIGH (ref 70–99)
Glucose-Capillary: 144 mg/dL — ABNORMAL HIGH (ref 70–99)
Glucose-Capillary: 150 mg/dL — ABNORMAL HIGH (ref 70–99)
Glucose-Capillary: 170 mg/dL — ABNORMAL HIGH (ref 70–99)
Glucose-Capillary: 175 mg/dL — ABNORMAL HIGH (ref 70–99)
Glucose-Capillary: 203 mg/dL — ABNORMAL HIGH (ref 70–99)
Glucose-Capillary: 208 mg/dL — ABNORMAL HIGH (ref 70–99)
Glucose-Capillary: 62 mg/dL — ABNORMAL LOW (ref 70–99)
Glucose-Capillary: 66 mg/dL — ABNORMAL LOW (ref 70–99)
Glucose-Capillary: 67 mg/dL — ABNORMAL LOW (ref 70–99)
Glucose-Capillary: 78 mg/dL (ref 70–99)
Glucose-Capillary: 79 mg/dL (ref 70–99)
Glucose-Capillary: 85 mg/dL (ref 70–99)
Glucose-Capillary: 90 mg/dL (ref 70–99)
Glucose-Capillary: 94 mg/dL (ref 70–99)

## 2021-06-01 LAB — CBC
HCT: 29.1 % — ABNORMAL LOW (ref 36.0–46.0)
HCT: 37.4 % (ref 36.0–46.0)
HCT: 37.9 % (ref 36.0–46.0)
Hemoglobin: 9.8 g/dL — ABNORMAL LOW (ref 12.0–15.0)
Hemoglobin: 12.8 g/dL (ref 12.0–15.0)
Hemoglobin: 12.8 g/dL (ref 12.0–15.0)
MCH: 32 pg (ref 26.0–34.0)
MCH: 31.5 pg (ref 26.0–34.0)
MCH: 31.1 pg (ref 26.0–34.0)
MCHC: 33.7 g/dL (ref 30.0–36.0)
MCHC: 34.2 g/dL (ref 30.0–36.0)
MCHC: 33.8 g/dL (ref 30.0–36.0)
MCV: 95.1 fL (ref 80.0–100.0)
MCV: 92.1 fL (ref 80.0–100.0)
MCV: 92 fL (ref 80.0–100.0)
Platelets: UNDETERMINED 10*3/uL (ref 150–400)
Platelets: 145 10*3/uL — ABNORMAL LOW (ref 150–400)
Platelets: 156 10*3/uL (ref 150–400)
RBC: 3.06 MIL/uL — ABNORMAL LOW (ref 3.87–5.11)
RBC: 4.06 MIL/uL (ref 3.87–5.11)
RBC: 4.12 MIL/uL (ref 3.87–5.11)
RDW: 12.9 % (ref 11.5–15.5)
RDW: 12.8 % (ref 11.5–15.5)
RDW: 12.8 % (ref 11.5–15.5)
WBC: 11.2 10*3/uL — ABNORMAL HIGH (ref 4.0–10.5)
WBC: 13.6 10*3/uL — ABNORMAL HIGH (ref 4.0–10.5)
WBC: 13.5 10*3/uL — ABNORMAL HIGH (ref 4.0–10.5)
nRBC: 0 % (ref 0.0–0.2)
nRBC: 0 % (ref 0.0–0.2)
nRBC: 0 % (ref 0.0–0.2)

## 2021-06-01 MED ORDER — EPHEDRINE 5 MG/ML INJ: 10.0000 mg | INTRAVENOUS | Status: DC | PRN

## 2021-06-01 MED ORDER — TERBUTALINE SULFATE 1 MG/ML IJ SOLN: 0.2500 mg | Freq: Once | INTRAMUSCULAR | Status: DC | PRN

## 2021-06-01 MED ORDER — FENTANYL-BUPIVACAINE-NACL 0.5-0.125-0.9 MG/250ML-% EP SOLN
12.0000 mL/h | EPIDURAL | Status: DC | PRN
  Administered 2021-06-01: 12 mL/h via EPIDURAL
  Filled 2021-06-01: qty 250

## 2021-06-01 MED ORDER — FENTANYL CITRATE (PF) 100 MCG/2ML IJ SOLN
50.0000 ug | INTRAMUSCULAR | Status: DC | PRN
  Administered 2021-06-01: 100 ug via INTRAVENOUS
  Filled 2021-06-01: qty 2

## 2021-06-01 MED ORDER — SOD CITRATE-CITRIC ACID 500-334 MG/5ML PO SOLN
30.0000 mL | ORAL | Status: DC | PRN
  Administered 2021-06-02: 30 mL via ORAL
  Filled 2021-06-01: qty 30

## 2021-06-01 MED ORDER — PENICILLIN G POT IN DEXTROSE 60000 UNIT/ML IV SOLN
3.0000 10*6.[IU] | INTRAVENOUS | Status: DC
  Administered 2021-06-01 – 2021-06-02 (×6): 3 10*6.[IU] via INTRAVENOUS
  Filled 2021-06-01 (×6): qty 50

## 2021-06-01 MED ORDER — LIDOCAINE HCL (PF) 1 % IJ SOLN
INTRAMUSCULAR | Status: DC | PRN
  Administered 2021-06-01: 8 mL via EPIDURAL

## 2021-06-01 MED ORDER — PHENYLEPHRINE 40 MCG/ML (10ML) SYRINGE FOR IV PUSH (FOR BLOOD PRESSURE SUPPORT): 80.0000 ug | PREFILLED_SYRINGE | INTRAVENOUS | Status: DC | PRN

## 2021-06-01 MED ORDER — LACTATED RINGERS IV SOLN: 500.0000 mL | INTRAVENOUS | Status: DC | PRN

## 2021-06-01 MED ORDER — SODIUM CHLORIDE 0.9 % IV SOLN
5.0000 10*6.[IU] | Freq: Once | INTRAVENOUS | Status: AC
  Administered 2021-06-01: 5 10*6.[IU] via INTRAVENOUS
  Filled 2021-06-01: qty 5

## 2021-06-01 MED ORDER — LACTATED RINGERS IV SOLN: 500.0000 mL | Freq: Once | INTRAVENOUS | Status: DC

## 2021-06-01 MED ORDER — ONDANSETRON HCL 4 MG/2ML IJ SOLN: 4.0000 mg | Freq: Four times a day (QID) | INTRAMUSCULAR | Status: DC | PRN

## 2021-06-01 MED ORDER — MISOPROSTOL 50MCG HALF TABLET
50.0000 ug | ORAL_TABLET | Freq: Once | ORAL | Status: AC
  Administered 2021-06-01: 50 ug via ORAL

## 2021-06-01 MED ORDER — ACETAMINOPHEN 325 MG PO TABS: 650.0000 mg | ORAL_TABLET | ORAL | Status: DC | PRN

## 2021-06-01 MED ORDER — DIPHENHYDRAMINE HCL 50 MG/ML IJ SOLN: 12.5000 mg | INTRAMUSCULAR | Status: DC | PRN

## 2021-06-01 MED ORDER — OXYTOCIN-SODIUM CHLORIDE 30-0.9 UT/500ML-% IV SOLN
2.5000 [IU]/h | INTRAVENOUS | Status: DC
  Administered 2021-06-02: 30 [IU] via INTRAVENOUS
  Filled 2021-06-01: qty 500

## 2021-06-01 MED ORDER — OXYTOCIN-SODIUM CHLORIDE 30-0.9 UT/500ML-% IV SOLN
1.0000 m[IU]/min | INTRAVENOUS | Status: DC
  Administered 2021-06-01: 2 m[IU]/min via INTRAVENOUS
  Administered 2021-06-02: 24 m[IU]/min via INTRAVENOUS
  Filled 2021-06-01: qty 500

## 2021-06-01 MED ORDER — DIPHENHYDRAMINE HCL 50 MG/ML IJ SOLN
12.5000 mg | INTRAMUSCULAR | Status: DC | PRN
Start: 2021-06-01 — End: 2021-06-02

## 2021-06-01 MED ORDER — LIDOCAINE HCL (PF) 1 % IJ SOLN: 30.0000 mL | INTRAMUSCULAR | Status: DC | PRN

## 2021-06-01 MED ORDER — OXYTOCIN BOLUS FROM INFUSION
333.0000 mL | Freq: Once | INTRAVENOUS | Status: DC
Start: 2021-06-01 — End: 2021-06-02

## 2021-06-01 MED ORDER — MISOPROSTOL 50MCG HALF TABLET
ORAL_TABLET | ORAL | Status: AC
  Filled 2021-06-01: qty 1

## 2021-06-01 MED ORDER — FENTANYL CITRATE (PF) 100 MCG/2ML IJ SOLN
50.0000 ug | INTRAMUSCULAR | Status: DC | PRN
  Administered 2021-06-01 (×6): 50 ug via INTRAVENOUS
  Filled 2021-06-01 (×6): qty 2

## 2021-06-01 MED ORDER — FENTANYL-BUPIVACAINE-NACL 0.5-0.125-0.9 MG/250ML-% EP SOLN: 12.0000 mL/h | EPIDURAL | Status: DC | PRN

## 2021-06-01 MED ORDER — LACTATED RINGERS IV SOLN: INTRAVENOUS | Status: DC

## 2021-06-01 NOTE — Progress Notes (Signed)
Date and time results received: 06/01/21 1715 (use smartphrase ".now" to insert current time)  Test: Magnesium Critical Value: 25.4  Name of Provider Notified: Paula Compton  Orders Received? Or Actions Taken?: Orders Received - See Orders for details  Mahalia Longest, RN

## 2021-06-01 NOTE — Progress Notes (Signed)
Patient ID: [name], female   DOB: 04-Oct-1980, 40 y.o.   MRN: [id number] CTSP at 1330pm for a prolonged deceleration for about 4 minutes that resolved with a bolus and position change, strip then back to category 1. Pt then just now complained to nurse of having a heavy feeling in chest so I was asked to evaluate her again.  O2 sat 99-100%  BS 125 BO 130-140/80's  HR 70-80  Pt states chest feels heavy but denies SOB.  She seems sleepy but is easily responsive.  Magnesium turned off and stat labs sent.  Will get EKG and ensure no acute changes.     Cervix 2/thick and balloon in place FHR category 1

## 2021-06-01 NOTE — Progress Notes (Signed)
SCD's applied to bilateral lower extremities. Pt educated on importance of SCD's. Pt verbalized understanding.

## 2021-06-01 NOTE — Anesthesia Preprocedure Evaluation (Signed)
Anesthesia Evaluation  Patient identified by MRN, date of birth, ID band Patient awake    Reviewed: Allergy & Precautions, H&P , NPO status , Patient's Chart, lab work & pertinent test results, reviewed documented beta blocker date and time   Airway Mallampati: II  TM Distance: >3 FB Neck ROM: full    Dental no notable dental hx.    Pulmonary asthma , pneumonia, former smoker,    Pulmonary exam normal breath sounds clear to auscultation       Cardiovascular hypertension, Pt. on medications Normal cardiovascular exam Rhythm:regular Rate:Normal     Neuro/Psych PSYCHIATRIC DISORDERS Anxiety Depression Bipolar Disorder TIA   GI/Hepatic negative GI ROS, Neg liver ROS,   Endo/Other  diabetes  Renal/GU negative Renal ROS  negative genitourinary   Musculoskeletal   Abdominal   Peds  Hematology negative hematology ROS (+)   Anesthesia Other Findings   Reproductive/Obstetrics (+) Pregnancy                             Anesthesia Physical Anesthesia Plan  ASA: 3  Anesthesia Plan: Epidural   Post-op Pain Management:    Induction:   PONV Risk Score and Plan: Treatment may vary due to age or medical condition  Airway Management Planned: Natural Airway  Additional Equipment: None  Intra-op Plan:   Post-operative Plan: Extubation in OR  Informed Consent: I have reviewed the patients History and Physical, chart, labs and discussed the procedure including the risks, benefits and alternatives for the proposed anesthesia with the patient or authorized representative who has indicated his/her understanding and acceptance.       Plan Discussed with: Anesthesiologist  Anesthesia Plan Comments:         Anesthesia Quick Evaluation

## 2021-06-01 NOTE — Progress Notes (Signed)
Patient ID: [name], female   DOB: [birth year], 40 y.o.   MRN: [id number] Pt sitting up in semi-fowlers position, feeling better overall.  No further chest heaviness, feeling some contractions but not all and mild.  Same HA  BP 145-157/75-95 FHR category 1 overall with accels but does have some decreased variability on the magnesium   BS stable on endotool, maglevel finally came back at 4.0 after had been off for 2 hours so restarted at 1g/hr given pt's symptoms  Increasing pitocin and hoping to get more adequate so can see if vertex descended enough for AROM, just increased to 8 milliunits  D/w pt and husband plan.

## 2021-06-01 NOTE — Progress Notes (Signed)
Pt to room 218 for IOL. Pt in stable condition with all belongings.

## 2021-06-01 NOTE — Progress Notes (Signed)
Patient ID: [name], female   DOB: 16-Dec-1980, 40 y.o.   MRN: [id number] Pt states chest pain improved.  Now teary and moaning from abdominal and contraction pain.  Still has HA  Magnesium has been off about 2 hours, and still no magnesium level  reported from lab despite multiple phone calls, we are told it is "running"   Will restart magnesium at 1g/hour EKG NSR and normal  Foley bulb at os and 2-3 cm dilated , deflated slightly and able to pull out.  Cannot reach fetal vertex yet for AROM, [place] confirms head is down  Will start pitocin and AROM when able Continue endotool Magnesium restarted, still awaiting level FHR category 1 overall Follow progress

## 2021-06-01 NOTE — Anesthesia Procedure Notes (Signed)
Epidural Patient location during procedure: OB Start time: 06/01/2021 10:26 PM End time: 06/01/2021 10:30 PM  Staffing Anesthesiologist: Janeece Riggers, MD  Preanesthetic Checklist Completed: patient identified, IV checked, site marked, risks and benefits discussed, surgical consent, monitors and equipment checked, pre-op evaluation and timeout performed  Epidural Patient position: sitting Prep: DuraPrep and site prepped and draped Patient monitoring: continuous pulse ox and blood pressure Approach: midline Location: L3-L4 Injection technique: LOR air  Needle:  Needle type: Tuohy  Needle gauge: 17 G Needle length: 9 cm and 9 Needle insertion depth: 7 cm Catheter type: closed end flexible Catheter size: 19 Gauge Catheter at skin depth: 13 cm Test dose: negative  Assessment Events: blood not aspirated, injection not painful, no injection resistance, no paresthesia and negative IV test

## 2021-06-01 NOTE — Progress Notes (Addendum)
Patient ID: [name], female   DOB: 11/23/1980, 40 y.o.   MRN: [id number] Pt just arrived to L&D.   Still has HA, but nothing else really changed Afeb highest BP 150/80's Cervix 30/1/-3 ballotable vertex Foley bulb placed and filled with 96m    Continue magnesium, follow BP and treat prn Oral cytotec x 1 given Continue endotool On PCN for +GBS Repeat labs pending

## 2021-06-01 NOTE — Progress Notes (Signed)
Patient ID: [name], female   DOB: 1981-02-09, 40 y.o.   MRN: [id number] Pt comfortable with epidural  BP 130/80's BS stable on endotool  Cervix 50/2-3/-2 AROM with FSE  Follow progress and will place IUPC as needed FHR category 1

## 2021-06-01 NOTE — Progress Notes (Signed)
Patient ID: [name], female   DOB: [birth year], 40 y.o.   MRN: [id number] Pt has been holding waiting to go to L&D for over 12 hours to begin IOL but has not been able to start due to staffing.  She reports her HA persists and received fentanyl about 7am, now having light breakfast.Continues on the endotool and BS well-controlled.    BP WNL on magnesium   Cervix 30/0.5/-3  FHR category 1     Pt Type 1 DM with BS currently stable on endotool, will continue H/o DVT and off lovenox >24 hours while we continue to try to get her to L&D for induction of labor BP stable on magnesium but does have persistent HA that has required IV narcotic to control. 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 Brien Mates and she states that she realizes it is not an ideal time to do this when she has severe preeclampsia and will defer to a later date, or husband will just get vasectomy.   Continuing to try to get pt to L&D.

## 2021-06-02 ENCOUNTER — Encounter (HOSPITAL_COMMUNITY)
Admission: AD | Disposition: A | Discharge status: Home / Self Care | Payer: Self-pay | Source: Home / Self Care | Attending: Obstetrics and Gynecology

## 2021-06-02 DIAGNOSIS — Z98891 History of uterine scar from previous surgery: Secondary | ICD-10-CM

## 2021-06-02 LAB — TYPE AND SCREEN
ABO/RH(D): A POS
Antibody Screen: NEGATIVE

## 2021-06-02 LAB — GLUCOSE, CAPILLARY
Glucose-Capillary: 116 mg/dL — ABNORMAL HIGH (ref 70–99)
Glucose-Capillary: 108 mg/dL — ABNORMAL HIGH (ref 70–99)
Glucose-Capillary: 82 mg/dL (ref 70–99)
Glucose-Capillary: 87 mg/dL (ref 70–99)
Glucose-Capillary: 73 mg/dL (ref 70–99)
Glucose-Capillary: 90 mg/dL (ref 70–99)
Glucose-Capillary: 88 mg/dL (ref 70–99)
Glucose-Capillary: 79 mg/dL (ref 70–99)
Glucose-Capillary: 103 mg/dL — ABNORMAL HIGH (ref 70–99)
Glucose-Capillary: 141 mg/dL — ABNORMAL HIGH (ref 70–99)
Glucose-Capillary: 121 mg/dL — ABNORMAL HIGH (ref 70–99)
Glucose-Capillary: 86 mg/dL (ref 70–99)
Glucose-Capillary: 99 mg/dL (ref 70–99)
Glucose-Capillary: 131 mg/dL — ABNORMAL HIGH (ref 70–99)
Glucose-Capillary: 101 mg/dL — ABNORMAL HIGH (ref 70–99)
Glucose-Capillary: 113 mg/dL — ABNORMAL HIGH (ref 70–99)
Glucose-Capillary: 119 mg/dL — ABNORMAL HIGH (ref 70–99)
Glucose-Capillary: 122 mg/dL — ABNORMAL HIGH (ref 70–99)
Glucose-Capillary: 99 mg/dL (ref 70–99)
Glucose-Capillary: 109 mg/dL — ABNORMAL HIGH (ref 70–99)

## 2021-06-02 LAB — BASIC METABOLIC PANEL
Anion gap: 7 (ref 5–15)
BUN: 9 mg/dL (ref 6–20)
CO2: 22 mmol/L (ref 22–32)
Calcium: 7.1 mg/dL — ABNORMAL LOW (ref 8.9–10.3)
Chloride: 106 mmol/L (ref 98–111)
Creatinine, Ser: 0.76 mg/dL (ref 0.44–1.00)
GFR, Estimated: >60 mL/min (ref >60)
Glucose, Bld: 80 mg/dL (ref 70–99)
Potassium: 4.2 mmol/L (ref 3.5–5.1)
Sodium: 135 mmol/L (ref 135–145)

## 2021-06-02 LAB — COMPREHENSIVE METABOLIC PANEL
ALT: 30 U/L (ref 0–44)
AST: 38 U/L (ref 15–41)
Albumin: 1.6 g/dL — ABNORMAL LOW (ref 3.5–5.0)
Alkaline Phosphatase: 103 U/L (ref 38–126)
Anion gap: 6 (ref 5–15)
BUN: 10 mg/dL (ref 6–20)
CO2: 19 mmol/L — ABNORMAL LOW (ref 22–32)
Calcium: 6.8 mg/dL — ABNORMAL LOW (ref 8.9–10.3)
Chloride: 109 mmol/L (ref 98–111)
Creatinine, Ser: 0.81 mg/dL (ref 0.44–1.00)
GFR, Estimated: >60 mL/min (ref >60)
Glucose, Bld: 110 mg/dL — ABNORMAL HIGH (ref 70–99)
Potassium: 4 mmol/L (ref 3.5–5.1)
Sodium: 134 mmol/L — ABNORMAL LOW (ref 135–145)
Total Bilirubin: 0.4 mg/dL (ref 0.3–1.2)
Total Protein: 4.5 g/dL — ABNORMAL LOW (ref 6.5–8.1)

## 2021-06-02 LAB — CBC
HCT: 37.2 % (ref 36.0–46.0)
Hemoglobin: 12.8 g/dL (ref 12.0–15.0)
MCH: 31.6 pg (ref 26.0–34.0)
MCHC: 34.4 g/dL (ref 30.0–36.0)
MCV: 91.9 fL (ref 80.0–100.0)
Platelets: 152 10*3/uL (ref 150–400)
RBC: 4.05 MIL/uL (ref 3.87–5.11)
RDW: 12.9 % (ref 11.5–15.5)
WBC: 19.6 10*3/uL — ABNORMAL HIGH (ref 4.0–10.5)
nRBC: 0 % (ref 0.0–0.2)

## 2021-06-02 LAB — RPR: RPR Ser Ql: NONREACTIVE

## 2021-06-02 LAB — MAGNESIUM: Magnesium: 4.2 mg/dL — ABNORMAL HIGH (ref 1.7–2.4)

## 2021-06-02 SURGERY — Surgical Case
Anesthesia: Epidural | Wound class: Clean Contaminated

## 2021-06-02 MED ORDER — LORAZEPAM 2 MG/ML IJ SOLN
INTRAMUSCULAR | Status: AC
  Filled 2021-06-02: qty 1

## 2021-06-02 MED ORDER — COCONUT OIL OIL
1.0000 "application " | TOPICAL_OIL | Status: DC | PRN
  Administered 2021-06-03: 1 via TOPICAL

## 2021-06-02 MED ORDER — SODIUM BICARBONATE 8.4 % IV SOLN
INTRAVENOUS | Status: DC | PRN
  Administered 2021-06-02: 4 mL via EPIDURAL
  Administered 2021-06-02: 5 mL via EPIDURAL
  Administered 2021-06-02: 4 mL via EPIDURAL

## 2021-06-02 MED ORDER — CHLOROPROCAINE HCL (PF) 3 % IJ SOLN
INTRAMUSCULAR | Status: DC | PRN
  Administered 2021-06-02: 20 mL

## 2021-06-02 MED ORDER — DIBUCAINE (PERIANAL) 1 % EX OINT: 1.0000 "application " | TOPICAL_OINTMENT | CUTANEOUS | Status: DC | PRN

## 2021-06-02 MED ORDER — DIPHENHYDRAMINE HCL 25 MG PO CAPS: 25.0000 mg | ORAL_CAPSULE | Freq: Four times a day (QID) | ORAL | Status: DC | PRN

## 2021-06-02 MED ORDER — KETOROLAC TROMETHAMINE 30 MG/ML IJ SOLN: 30.0000 mg | Freq: Four times a day (QID) | INTRAMUSCULAR | Status: AC | PRN

## 2021-06-02 MED ORDER — GENTAMICIN SULFATE 40 MG/ML IJ SOLN
500.0000 mg | Freq: Once | INTRAVENOUS | Status: DC
  Filled 2021-06-02: qty 12.5

## 2021-06-02 MED ORDER — ONDANSETRON HCL 4 MG/2ML IJ SOLN
INTRAMUSCULAR | Status: DC | PRN
  Administered 2021-06-02: 4 mg via INTRAVENOUS

## 2021-06-02 MED ORDER — MIDAZOLAM HCL 2 MG/2ML IJ SOLN
INTRAMUSCULAR | Status: DC | PRN
  Administered 2021-06-02: 2 mg via INTRAVENOUS

## 2021-06-02 MED ORDER — ENOXAPARIN SODIUM 100 MG/ML IJ SOSY
100.0000 mg | PREFILLED_SYRINGE | Freq: Two times a day (BID) | INTRAMUSCULAR | Status: DC
  Administered 2021-06-03 – 2021-06-07 (×8): 100 mg via SUBCUTANEOUS
  Filled 2021-06-02 (×10): qty 1

## 2021-06-02 MED ORDER — TETANUS-DIPHTH-ACELL PERTUSSIS 5-2.5-18.5 LF-MCG/0.5 IM SUSY: 0.5000 mL | PREFILLED_SYRINGE | Freq: Once | INTRAMUSCULAR | Status: DC

## 2021-06-02 MED ORDER — LORAZEPAM 2 MG/ML IJ SOLN
0.5000 mg | INTRAMUSCULAR | Status: AC | PRN
  Administered 2021-06-02 (×2): 0.5 mg via INTRAVENOUS

## 2021-06-02 MED ORDER — FENTANYL CITRATE (PF) 100 MCG/2ML IJ SOLN
INTRAMUSCULAR | Status: DC | PRN
  Administered 2021-06-02 (×2): 50 ug via INTRAVENOUS

## 2021-06-02 MED ORDER — GENTAMICIN SULFATE 40 MG/ML IJ SOLN
500.0000 mg | Freq: Once | INTRAVENOUS | Status: AC
  Administered 2021-06-02: 500 mg via INTRAVENOUS
  Filled 2021-06-02: qty 12.5

## 2021-06-02 MED ORDER — DEXMEDETOMIDINE (PRECEDEX) IN NS 20 MCG/5ML (4 MCG/ML) IV SYRINGE
PREFILLED_SYRINGE | INTRAVENOUS | Status: DC | PRN
  Administered 2021-06-02: 8 ug via INTRAVENOUS

## 2021-06-02 MED ORDER — LORAZEPAM BOLUS VIA INFUSION: 1.0000 mg | INTRAVENOUS | Status: DC | PRN

## 2021-06-02 MED ORDER — ONDANSETRON HCL 4 MG/2ML IJ SOLN: 4.0000 mg | Freq: Three times a day (TID) | INTRAMUSCULAR | Status: DC | PRN

## 2021-06-02 MED ORDER — WITCH HAZEL-GLYCERIN EX PADS: 1.0000 "application " | MEDICATED_PAD | CUTANEOUS | Status: DC | PRN

## 2021-06-02 MED ORDER — NALOXONE HCL 4 MG/10ML IJ SOLN
1.0000 ug/kg/h | INTRAVENOUS | Status: DC | PRN
  Filled 2021-06-02: qty 5

## 2021-06-02 MED ORDER — MEPERIDINE HCL 25 MG/ML IJ SOLN: 6.2500 mg | INTRAMUSCULAR | Status: DC | PRN

## 2021-06-02 MED ORDER — NALOXONE HCL 0.4 MG/ML IJ SOLN: 0.4000 mg | INTRAMUSCULAR | Status: DC | PRN

## 2021-06-02 MED ORDER — PRENATAL MULTIVITAMIN CH
1.0000 | ORAL_TABLET | Freq: Every day | ORAL | Status: DC
  Administered 2021-06-03 – 2021-06-06 (×4): 1 via ORAL
  Filled 2021-06-02 (×4): qty 1

## 2021-06-02 MED ORDER — FENTANYL CITRATE (PF) 100 MCG/2ML IJ SOLN
INTRAMUSCULAR | Status: AC
  Filled 2021-06-02: qty 2

## 2021-06-02 MED ORDER — FENTANYL CITRATE (PF) 100 MCG/2ML IJ SOLN
25.0000 ug | INTRAMUSCULAR | Status: DC | PRN
  Administered 2021-06-02 (×2): 50 ug via INTRAVENOUS

## 2021-06-02 MED ORDER — LACTATED RINGERS IV SOLN: INTRAVENOUS | Status: DC

## 2021-06-02 MED ORDER — SODIUM CHLORIDE 0.9% FLUSH: 3.0000 mL | INTRAVENOUS | Status: DC | PRN

## 2021-06-02 MED ORDER — ARTIFICIAL TEARS OPHTHALMIC OINT
TOPICAL_OINTMENT | OPHTHALMIC | Status: AC
  Filled 2021-06-02: qty 3.5

## 2021-06-02 MED ORDER — ZOLPIDEM TARTRATE 5 MG PO TABS: 5.0000 mg | ORAL_TABLET | Freq: Every evening | ORAL | Status: DC | PRN

## 2021-06-02 MED ORDER — DIPHENHYDRAMINE HCL 25 MG PO CAPS: 25.0000 mg | ORAL_CAPSULE | ORAL | Status: DC | PRN

## 2021-06-02 MED ORDER — TRANEXAMIC ACID-NACL 1000-0.7 MG/100ML-% IV SOLN
INTRAVENOUS | Status: AC
  Filled 2021-06-02: qty 100

## 2021-06-02 MED ORDER — STERILE WATER FOR IRRIGATION IR SOLN
Status: DC | PRN
  Administered 2021-06-02: 1

## 2021-06-02 MED ORDER — MIDAZOLAM HCL 2 MG/2ML IJ SOLN
INTRAMUSCULAR | Status: AC
  Filled 2021-06-02: qty 2

## 2021-06-02 MED ORDER — CLINDAMYCIN PHOSPHATE 900 MG/50ML IV SOLN
INTRAVENOUS | Status: DC | PRN
  Administered 2021-06-02: 900 mg via INTRAVENOUS

## 2021-06-02 MED ORDER — FENTANYL CITRATE (PF) 100 MCG/2ML IJ SOLN
25.0000 ug | INTRAMUSCULAR | Status: DC | PRN
  Administered 2021-06-03 – 2021-06-05 (×2): 25 ug via INTRAVENOUS
  Filled 2021-06-02 (×2): qty 2

## 2021-06-02 MED ORDER — MENTHOL 3 MG MT LOZG: 1.0000 | LOZENGE | OROMUCOSAL | Status: DC | PRN

## 2021-06-02 MED ORDER — CLINDAMYCIN PHOSPHATE 900 MG/50ML IV SOLN
INTRAVENOUS | Status: AC
  Filled 2021-06-02: qty 50

## 2021-06-02 MED ORDER — DIPHENHYDRAMINE HCL 50 MG/ML IJ SOLN: 12.5000 mg | INTRAMUSCULAR | Status: DC | PRN

## 2021-06-02 MED ORDER — OXYTOCIN-SODIUM CHLORIDE 30-0.9 UT/500ML-% IV SOLN: 2.5000 [IU]/h | INTRAVENOUS | Status: AC

## 2021-06-02 MED ORDER — SIMETHICONE 80 MG PO CHEW
80.0000 mg | CHEWABLE_TABLET | Freq: Three times a day (TID) | ORAL | Status: DC
  Administered 2021-06-03 – 2021-06-07 (×13): 80 mg via ORAL
  Filled 2021-06-02 (×13): qty 1

## 2021-06-02 MED ORDER — KETOROLAC TROMETHAMINE 30 MG/ML IJ SOLN
30.0000 mg | Freq: Four times a day (QID) | INTRAMUSCULAR | Status: AC | PRN
  Administered 2021-06-02 – 2021-06-03 (×2): 30 mg via INTRAVENOUS
  Filled 2021-06-02 (×2): qty 1

## 2021-06-02 MED ORDER — IBUPROFEN 600 MG PO TABS
600.0000 mg | ORAL_TABLET | Freq: Four times a day (QID) | ORAL | Status: AC
  Administered 2021-06-03 – 2021-06-05 (×10): 600 mg via ORAL
  Filled 2021-06-02 (×10): qty 1

## 2021-06-02 MED ORDER — HYDROCODONE-ACETAMINOPHEN 5-325 MG PO TABS
1.0000 | ORAL_TABLET | ORAL | Status: DC | PRN
  Administered 2021-06-03: 2 via ORAL
  Administered 2021-06-03: 1 via ORAL
  Administered 2021-06-03 – 2021-06-05 (×8): 2 via ORAL
  Administered 2021-06-06: 1 via ORAL
  Administered 2021-06-06 (×3): 2 via ORAL
  Administered 2021-06-07 (×2): 1 via ORAL
  Filled 2021-06-02: qty 2
  Filled 2021-06-02: qty 1
  Filled 2021-06-02 (×3): qty 2
  Filled 2021-06-02: qty 1
  Filled 2021-06-02 (×2): qty 2
  Filled 2021-06-02: qty 1
  Filled 2021-06-02 (×3): qty 2
  Filled 2021-06-02: qty 1
  Filled 2021-06-02 (×4): qty 2

## 2021-06-02 MED ORDER — ACETAMINOPHEN 10 MG/ML IV SOLN
1000.0000 mg | Freq: Once | INTRAVENOUS | Status: AC
  Administered 2021-06-02: 1000 mg via INTRAVENOUS

## 2021-06-02 MED ORDER — SIMETHICONE 80 MG PO CHEW: 80.0000 mg | CHEWABLE_TABLET | ORAL | Status: DC | PRN

## 2021-06-02 MED ORDER — SENNOSIDES-DOCUSATE SODIUM 8.6-50 MG PO TABS
2.0000 | ORAL_TABLET | Freq: Every day | ORAL | Status: DC
  Administered 2021-06-05 – 2021-06-07 (×3): 2 via ORAL
  Filled 2021-06-02 (×4): qty 2

## 2021-06-02 MED ORDER — SODIUM CHLORIDE 0.9 % IR SOLN
Status: DC | PRN
  Administered 2021-06-02: 1

## 2021-06-02 MED ORDER — ACETAMINOPHEN 10 MG/ML IV SOLN
INTRAVENOUS | Status: AC
  Filled 2021-06-02: qty 100

## 2021-06-02 SURGICAL SUPPLY — 36 items
APL SKNCLS STERI-STRIP NONHPOA (GAUZE/BANDAGES/DRESSINGS) ×1
BENZOIN TINCTURE PRP APPL 2/3 (GAUZE/BANDAGES/DRESSINGS) ×1 IMPLANT
CHLORAPREP W/TINT 26ML (MISCELLANEOUS) ×2 IMPLANT
CLAMP CORD UMBIL (MISCELLANEOUS) IMPLANT
CLOTH BEACON ORANGE TIMEOUT ST (SAFETY) ×2 IMPLANT
DRSG OPSITE POSTOP 4X10 (GAUZE/BANDAGES/DRESSINGS) ×2 IMPLANT
ELECT REM PT RETURN 9FT ADLT (ELECTROSURGICAL) ×2
ELECTRODE REM PT RTRN 9FT ADLT (ELECTROSURGICAL) ×1 IMPLANT
EXTRACTOR VACUUM KIWI (MISCELLANEOUS) IMPLANT
GLOVE BIO SURGEON STRL SZ 6.5 (GLOVE) ×2 IMPLANT
GLOVE BIOGEL PI IND STRL 7.0 (GLOVE) ×1 IMPLANT
GLOVE BIOGEL PI INDICATOR 7.0 (GLOVE) ×1
GOWN STRL REUS W/TWL LRG LVL3 (GOWN DISPOSABLE) ×4 IMPLANT
KIT ABG SYR 3ML LUER SLIP (SYRINGE) IMPLANT
NDL HYPO 25X5/8 SAFETYGLIDE (NEEDLE) IMPLANT
NEEDLE HYPO 25X5/8 SAFETYGLIDE (NEEDLE) IMPLANT
NS IRRIG 1000ML POUR BTL (IV SOLUTION) ×2 IMPLANT
PACK C SECTION WH (CUSTOM PROCEDURE TRAY) ×2 IMPLANT
PAD OB MATERNITY 4.3X12.25 (PERSONAL CARE ITEMS) ×2 IMPLANT
PENCIL SMOKE EVAC W/HOLSTER (ELECTROSURGICAL) ×2 IMPLANT
RTRCTR C-SECT PINK 25CM LRG (MISCELLANEOUS) ×2 IMPLANT
STRIP CLOSURE SKIN 1/2X4 (GAUZE/BANDAGES/DRESSINGS) ×1 IMPLANT
SUT CHROMIC 1 CTX 36 (SUTURE) ×4 IMPLANT
SUT PLAIN 0 NONE (SUTURE) IMPLANT
SUT PLAIN 2 0 XLH (SUTURE) ×2 IMPLANT
SUT VIC AB 0 CT1 27 (SUTURE) ×4
SUT VIC AB 0 CT1 27XBRD ANBCTR (SUTURE) ×2 IMPLANT
SUT VIC AB 0 CT1 36 (SUTURE) ×2 IMPLANT
SUT VIC AB 2-0 CT1 27 (SUTURE) ×2
SUT VIC AB 2-0 CT1 TAPERPNT 27 (SUTURE) ×1 IMPLANT
SUT VIC AB 3-0 CT1 27 (SUTURE)
SUT VIC AB 3-0 CT1 TAPERPNT 27 (SUTURE) IMPLANT
SUT VIC AB 4-0 KS 27 (SUTURE) ×2 IMPLANT
TOWEL OR 17X24 6PK STRL BLUE (TOWEL DISPOSABLE) ×2 IMPLANT
TRAY FOLEY W/BAG SLVR 14FR LF (SET/KITS/TRAYS/PACK) ×2 IMPLANT
WATER STERILE IRR 1000ML POUR (IV SOLUTION) ×2 IMPLANT

## 2021-06-02 NOTE — Progress Notes (Signed)
Patient ID: [name], female   DOB: 12-27-1980, 40 y.o.   MRN: [id number] Pt comfortable with epidural  BP stable BS stable FHR category 1  Cervix 70/3-4/-2  IUPC placed to help titrate pitocin Follow progress

## 2021-06-02 NOTE — Lactation Note (Signed)
This note was copied from a baby's chart. Lactation Consultation Note  Patient Name: [name] [id number] Date: 06/02/2021   Age:40 hours  Unable to visit with mom at this time, she's still in recovery after her C/S and her room is listed as "NONE" in [place] grease board. NICU LC to follow up tomorrow.  [place] 06/02/2021, 8:00 PM

## 2021-06-02 NOTE — Progress Notes (Signed)
Patient ID: [name], female   DOB: 07/20/1981, 40 y.o.   MRN: [id number] Pt feeling some intermittent pressure  FHR still category 1 BP 115-67-147/83 BS stable on endotool  Cervix 90/8-9/0  Great progress! Will recheck in an hour unless uncomfortable prior to allow to descend

## 2021-06-02 NOTE — Op Note (Signed)
Operative Note    Preoperative Diagnosis Preterm pregnancy at 34 3/7 weeks Type 1 DM Severe preeclampsia Arrest of descent and maternal exhaustion Desires permanent sterility  Postoperative Diagnosis Same  Procedure Primary low transverse c-section with two layer closure of uterus Bilateral tubal sterilization with distal salpingectomies  Surgeon Paula Compton, MD Maida Sale, RNFA  Anesthesia Epidural  Fluids: EBL 436m UOP 3525mIVF 84640mR  Findings A viable female infant in the vertex presentation.  Small skin tag on left cheek of baby. Apgars 9,9 Weight 5#3oz  Normal female pelvic anatomy.   Specimen Placenta to pathology  Procedure Note Patient was taken to the operating room where epidural anesthesia was boosted and found to be adequate by Allis clamp test. She was prepped and draped in the normal sterile fashion in the dorsal supine position with a leftward tilt. An appropriate time out was performed. A Pfannenstiel skin incision was then made with the scalpel and carried through to the underlying layer of fascia by sharp dissection and Bovie cautery. The fascia was nicked in the midline and the incision was extended laterally with Mayo scissors. The inferior aspect of the incision was grasped Coker clamps and dissected off the underlying rectus muscles. In a similar fashion the superior aspect was dissected off the rectus muscles. Rectus muscles were separated in the midline and the peritoneal cavity entered bluntly. The peritoneal incision was then extended both superiorly and inferiorly with careful attention to avoid both bowel and bladder. The Alexis self-retaining wound retractor was then placed within the incision and the lower uterine segment exposed. The bladder flap was developed with Metzenbaum scissors and pushed away from the lower uterine segment. The lower uterine segment was then incised in a transverse fashion and the cavity itself entered bluntly.  The incision was extended bluntly. The infant's head was then lifted and delivered from the incision with a bit of effort as it was pretty deeply wedged in the pelvis.  The remainder of the infant delivered and the nose and mouth bulb suctioned with the cord clamped and cut as well. The infant was handed off to the waiting pediatricians. The placenta was then manually expressed from the uterus as it was somewhat adherent and the uterus cleared of all clots and debris with moist lap sponge. There were no placental fragments palpated in a sweep of the cavity.  The uterine incision was then repaired in 2 layers the first layer was a running locked layer 1-0 chromic and the second an imbricating layer of the same suture. A small area of bleeding on the right lower segment was controlled with a figure of 3-0 vicryl.  The tubes and ovaries were inspected and the gutters cleared of all clots and debris. The uterine incision was inspected and found to be hemostatic.  Attention was turned to the fallopian tubes which were identified and traced out ot their fimbriated end and lifted with a babcock clamp.  The distal end was then cross clamped with a kelly clamp and amputated with mayo scissors.  The free pedicles were secured with a suture ligature of 2-0 vicryl x 2 and were hemostatic.   All instruments and sponges as well as the Alexis retractor were then removed from the abdomen. The rectus muscles and peritoneum were then reapproximated with a running suture of 2-0 Vicryl. The fascia was then closed with 0 Vicryl in a running fashion. Subcutaneous tissue was reapproximated with 3-0 plain in a running fashion. The skin was closed with  a subcuticular stitch of 4-0 Vicryl on a Keith needle and then reinforced with benzoin and Steri-Strips. At the conclusion of the procedure all instruments and sponge counts were correct. Patient was taken to the recovery room in good condition and the baby went to the NICU stable for  gestational age

## 2021-06-02 NOTE — Progress Notes (Signed)
Patient ID: [name], female   DOB: [birth year], 40 y.o.   MRN: [id number] Pt in PACU and c/o legs hurting at hip and knee from pushing.  Admits to feeling anxious and claustrophobic.  Given IV ativan x 1  Pt did have several episodes of diarrhea at the end of pushing and in the OR.    Checking all her labs and renewing T&S. Will follow blood pressures and manage anxiety.

## 2021-06-02 NOTE — Anesthesia Postprocedure Evaluation (Signed)
Anesthesia Post Note  Patient: [name]  Procedure(s) Performed: [place]     Patient location during evaluation: Mother Baby Anesthesia Type: Epidural Level of consciousness: awake and alert Pain management: pain level controlled Vital Signs Assessment: post-procedure vital signs reviewed and stable Respiratory status: spontaneous breathing, nonlabored ventilation and respiratory function stable Cardiovascular status: stable Postop Assessment: no headache, no backache and epidural receding Anesthetic complications: no   No notable events documented.  Last Vitals:  Vitals:   06/02/21 1456 06/02/21 1502  BP: 124/79 113/73  Pulse: (!) 101 (!) 129  Resp:  20  Temp:    SpO2:  100%    Last Pain:  Vitals:   06/02/21 0800  TempSrc: Oral  PainSc: 4                  Smayan Hackbart

## 2021-06-02 NOTE — Progress Notes (Signed)
Patient ID: [name], female   DOB: 06-13-1981, 40 y.o.   MRN: [id number] Pt dosed and got a bit more comfortable and we attempted to resume pushing for about 30 more minutes but she had almost no effort and vertex still at +1 station with no contraction or pushing.  She had no epidural with her first baby and pushed awhile with a shoulder dystocia of a 4#15oz baby.  I have reviewed with her options and just feel it is safer for her to proceed with c-section as the vertex is not consistently a +2 station and maternal effort is poor with pushing at this point due to fatigue.  Baby category 2 tracing with some variable and late decelerations intermittently.    We discussed the c-section risks and benefits in detail including bleeding, infection and possible damage to bowel and bladder. She desires to proceed and states can no longer push.  We also discussed her desire for permanent sterilization which she is adamant she desires.  We discussed specifically that this is a permanent procedure and that since this baby is preterm with a heart anomaly would she consider future pregnancies if this baby did not do well and she states she will not have another pregnancy irregardless due to concerns for her health.    OR notified and we will proceed when ready

## 2021-06-02 NOTE — Progress Notes (Signed)
Patient ID: [name], female   DOB: 02-Apr-1981, 40 y.o.   MRN: [id number] Labs stable and BP improved 120-140/70-80  UOP adequate, but not diuresing  Continue magnesium x 24 hours so d/c at 430pm tomorrow  Continue endotool this PM as pt too groggy and uncomfortable to bolus herself and manage, will plan for diabetes educator to help tomorrow get her back on pump and either Freestyle libre CGM or Dexcom (pt has at home) Bronson Curb, MSN, RNC-OB Diabetes Coordinator [phone] (8a-5p)    Lovenox written to resume 24 hours post c-section.  Pt has allergies to most pain medications but states she can tolerate vicodin, so this is ordered for prn use.

## 2021-06-02 NOTE — Progress Notes (Signed)
Patient ID: [name], female   DOB: Jun 06, 1981, 40 y.o.   MRN: [id number] Pt examined at about 1300 due to increasing pressure and found to be c/c/+1 station.  Due to discomfort stated pushing and brought vertex to a +2 station with some caput visible at introitus with pushing.  She pushed effectively for about an hour and then as felt increasing pressure became too uncomfortable to coach and began to make less progress. We discussed options of re-dosing epidural and resting for awhile then resuming pushing and this is what she wanted to do.   Overall FHR acceptable category 2 --some mild variables with pushing and decrease invariability but some accels noted also and no prolonged decelerations  Dr. Royce Macadamia notified and dose pt with 2% lidocaine at bedside.  WIll allow to rest about 20-30 minutes and then resume pushing

## 2021-06-02 NOTE — Progress Notes (Signed)
Patient ID: [name], female   DOB: 10-Mar-1981, 40 y.o.   MRN: [id number] Pt comfortable with epidural, feels intermittent pelvic pressure.  HA present but improved  BS stable on endotool BP 120-140/60-80's  Cervix 5/80/-1 Baby asynclitic/OP  IUPC was somewhat pulled out so replaced. Pitocin at 28 mu Will continue position changes and follow progress FHR category 1

## 2021-06-02 NOTE — Transfer of Care (Addendum)
Immediate Anesthesia Transfer of Care Note  Patient: [name]  Procedure(s) Performed: CESAREAN SECTION  Patient Location: PACU  Anesthesia Type:Epidural  Level of Consciousness: alert , oriented and patient cooperative  Airway & Oxygen Therapy: Patient Spontanous Breathing  Post-op Assessment: Report given to RN, Post -op Vital signs reviewed and stable and Patient able to stick tongue midline  Post vital signs: Reviewed and stable  Last Vitals:  Vitals Value Taken Time  BP 170/143 06/02/21 1732  Temp    Pulse 99 06/02/21 1736  Resp 23 06/02/21 1736  SpO2 99 % 06/02/21 1736  Vitals shown include unvalidated device data.  Last Pain:  Vitals:   06/02/21 0800  TempSrc: Oral  PainSc: 4       Patients Stated Pain Goal: 4 ([id number])  Complications: No notable events documented.

## 2021-06-03 ENCOUNTER — Encounter (HOSPITAL_COMMUNITY): Payer: Self-pay | Admitting: Obstetrics and Gynecology

## 2021-06-03 LAB — GLUCOSE, CAPILLARY
Glucose-Capillary: 101 mg/dL — ABNORMAL HIGH (ref 70–99)
Glucose-Capillary: 101 mg/dL — ABNORMAL HIGH (ref 70–99)
Glucose-Capillary: 108 mg/dL — ABNORMAL HIGH (ref 70–99)
Glucose-Capillary: 112 mg/dL — ABNORMAL HIGH (ref 70–99)
Glucose-Capillary: 114 mg/dL — ABNORMAL HIGH (ref 70–99)
Glucose-Capillary: 119 mg/dL — ABNORMAL HIGH (ref 70–99)
Glucose-Capillary: 123 mg/dL — ABNORMAL HIGH (ref 70–99)
Glucose-Capillary: 143 mg/dL — ABNORMAL HIGH (ref 70–99)
Glucose-Capillary: 155 mg/dL — ABNORMAL HIGH (ref 70–99)
Glucose-Capillary: 86 mg/dL (ref 70–99)
Glucose-Capillary: 87 mg/dL (ref 70–99)
Glucose-Capillary: 91 mg/dL (ref 70–99)

## 2021-06-03 LAB — CBC
HCT: 35.7 % — ABNORMAL LOW (ref 36.0–46.0)
Hemoglobin: 11.9 g/dL — ABNORMAL LOW (ref 12.0–15.0)
MCH: 31.1 pg (ref 26.0–34.0)
MCHC: 33.3 g/dL (ref 30.0–36.0)
MCV: 93.2 fL (ref 80.0–100.0)
Platelets: 165 10*3/uL (ref 150–400)
RBC: 3.83 MIL/uL — ABNORMAL LOW (ref 3.87–5.11)
RDW: 13 % (ref 11.5–15.5)
WBC: 19.1 10*3/uL — ABNORMAL HIGH (ref 4.0–10.5)
nRBC: 0 % (ref 0.0–0.2)

## 2021-06-03 MED ORDER — LAMOTRIGINE 25 MG PO TABS
25.0000 mg | ORAL_TABLET | Freq: Every day | ORAL | Status: DC
  Administered 2021-06-03 – 2021-06-06 (×4): 25 mg via ORAL
  Filled 2021-06-03 (×7): qty 1

## 2021-06-03 MED ORDER — NIFEDIPINE ER OSMOTIC RELEASE 30 MG PO TB24
30.0000 mg | ORAL_TABLET | Freq: Every day | ORAL | Status: DC
  Administered 2021-06-03 – 2021-06-05 (×3): 30 mg via ORAL
  Filled 2021-06-03 (×3): qty 1

## 2021-06-03 MED ORDER — SERTRALINE HCL 50 MG PO TABS
50.0000 mg | ORAL_TABLET | Freq: Every day | ORAL | Status: DC
  Administered 2021-06-03 – 2021-06-06 (×4): 50 mg via ORAL
  Filled 2021-06-03 (×4): qty 1

## 2021-06-03 MED ORDER — CYCLOBENZAPRINE HCL 10 MG PO TABS
10.0000 mg | ORAL_TABLET | Freq: Three times a day (TID) | ORAL | Status: DC | PRN
  Administered 2021-06-03 – 2021-06-07 (×11): 10 mg via ORAL
  Filled 2021-06-03 (×11): qty 1

## 2021-06-03 MED ORDER — INSULIN ASPART 100 UNIT/ML IJ SOLN
5.0000 [IU] | Freq: Three times a day (TID) | INTRAMUSCULAR | Status: DC
  Administered 2021-06-03 – 2021-06-06 (×10): 5 [IU] via SUBCUTANEOUS

## 2021-06-03 MED ORDER — INSULIN ASPART 100 UNIT/ML IJ SOLN
0.0000 [IU] | Freq: Three times a day (TID) | INTRAMUSCULAR | Status: DC
  Administered 2021-06-03: 1 [IU] via SUBCUTANEOUS
  Administered 2021-06-04: 2 [IU] via SUBCUTANEOUS
  Administered 2021-06-04: 3 [IU] via SUBCUTANEOUS
  Administered 2021-06-04: 1 [IU] via SUBCUTANEOUS
  Administered 2021-06-05: 2 [IU] via SUBCUTANEOUS
  Administered 2021-06-05: 5 [IU] via SUBCUTANEOUS
  Administered 2021-06-05: 2 [IU] via SUBCUTANEOUS
  Administered 2021-06-06: 5 [IU] via SUBCUTANEOUS
  Administered 2021-06-06: 3 [IU] via SUBCUTANEOUS

## 2021-06-03 MED ORDER — INSULIN ASPART 100 UNIT/ML IJ SOLN: 0.0000 [IU] | Freq: Every day | INTRAMUSCULAR | Status: DC

## 2021-06-03 MED ORDER — INFLUENZA VAC SPLIT QUAD 0.5 ML IM SUSY
0.5000 mL | PREFILLED_SYRINGE | INTRAMUSCULAR | Status: AC
  Administered 2021-06-05: 0.5 mL via INTRAMUSCULAR
  Filled 2021-06-03 (×2): qty 0.5

## 2021-06-03 MED ORDER — MUSCLE RUB 10-15 % EX CREA
TOPICAL_CREAM | Freq: Four times a day (QID) | CUTANEOUS | Status: DC | PRN
  Administered 2021-06-03: 1 via TOPICAL
  Filled 2021-06-03: qty 85

## 2021-06-03 MED ORDER — INSULIN DETEMIR 100 UNIT/ML ~~LOC~~ SOLN
18.0000 [IU] | SUBCUTANEOUS | Status: DC
  Administered 2021-06-03 – 2021-06-04 (×2): 18 [IU] via SUBCUTANEOUS
  Filled 2021-06-03 (×3): qty 0.18

## 2021-06-03 NOTE — Progress Notes (Signed)
Patient reports significant hip / thigh pain that is limiting her mobility.  Hip pain started with pushing yesterday.  She was very anxious in the PACU and symptoms improved temporarily with a dose of ativan.  They returned postpartum.  Some improvement with flexeril and heating pad, but she reports it hurts too much to get out of bed.  When she gets out of bed, she does not feel week.  Sensation in extremities is normal.    She is tolerating PO, ambulating, voiding.  Incisional pain is controlled.  Lochia is appropriate  Vitals:   06/03/21 0400 06/03/21 0500 06/03/21 0600 06/03/21 0817  BP:    139/70  Pulse:    83  Resp: '18 18 18 18  '$ Temp:    97.9 F (36.6 C)  TempSrc:    Oral  SpO2:    100%  Weight:      Height:        NAD Abdomen:  soft, appropriate tenderness, incisions intact and without erythema or drainage ext:    Symmetric, 1+ edema bilaterally, no SCDs  Lab Results  Component Value Date   WBC 19.1 (H) 06/03/2021   HGB 11.9 (L) 06/03/2021   HCT 35.7 (L) 06/03/2021   MCV 93.2 06/03/2021   PLT 165 06/03/2021    --/--/A POS (09/11 1913)/RImmune  A/P    40 y.o. [id number] POD 1 s/p primary cesarean section at 34 weeks for arrest of descent Routine post op and postpartum care.   Severe preeclampsia--magnesium sulfate x 24 hours postpartum.  Will discontinue at 1600.  BPs mild range since delivery and labs stable.  Discussed low threshold to initiate oral anti-hypertensive medications if BPs rise following discontinuation of magnesium sulfate 2. H/o DVT during pregnancy.  Restart theraputic lovenox 24 hours postpartum.  SCDs placed back on at bedside and discussed importance of use at this time given limited mobility 3. Bipolar 2 disorder, GAD/panic disorder / PTSD.  Sees Dr. Parke Poisson with psychiatry along with weekly counselor appointment.  Had been started on zoloft and lamictal in July, but then stopped last month.  She desires to restart at this time.   4. Type 1 diabetes.   Discussed with diabetes coordinator.  Will transition off of endo tool today.  Will initiate their recommendation (see note).  Lauren will contact patient's endocrinologist with [facility] and plan to transition back to omnipod tomorrow.  5. Severe hip pain, bilateral.  No evidence of lower extremity weakness or deficits.  Continue scheduled ibuprofen + norco prn (multiple allergies) and flexeril prn.  Consider addition of gabapentin if needed.  Will place order for PT consultation today for assistance with mobility 6. Foley catheter still in place. Instructed RN to remove by 1600.

## 2021-06-03 NOTE — Lactation Note (Signed)
This note was copied from a baby's chart. Lactation Consultation Note Patient is at-risk for lactation failure. See risk factors below. Pumping ed provided and she has initiated pumping. [facility] team will plan f/u visits to mark progress and further assist.   Patient Name: [name] [id number] Date: 06/03/2021 Reason for consult: Initial assessment;NICU baby Age:40 hours  Maternal Data Has patient been taught Hand Expression?: Yes Does the patient have breastfeeding experience prior to this delivery?: Yes How long did the patient breastfeed?: pumped x 2 weeks Hx R breast cancer Hx R breast surgery with scar on R outer quadrant Hx lactation failure Hx PCOS T1DM Pre-E No breast changes during pregnancy   Feeding Mother's Current Feeding Choice: Breast Milk and Donor Milk   Lactation Tools Discussed/Used Tools: Pump  Interventions Interventions: Education  Discharge [place] Program: Yes [place]. Mother has contacted and has a plan to receive pump  Consult Status Consult Status: Follow-up Date: 06/03/21 Follow-up type: In-patient    Gwynne Edinger 06/03/2021, 4:05 PM

## 2021-06-03 NOTE — Progress Notes (Addendum)
Inpatient Diabetes Program Recommendations  AACE/ADA: New Consensus Statement on Inpatient Glycemic Control (2015)  Target Ranges:  Prepandial:   less than 140 mg/dL      Peak postprandial:   less than 180 mg/dL (1-2 hours)      Critically ill patients:  140 - 180 mg/dL   Lab Results  Component Value Date   GLUCAP 112 (H) 06/03/2021   HGBA1C 6.9 (H) 05/30/2021    Review of Glycemic Control Results for [name], [name] (MRN [id number]) as of 06/03/2021 08:18  Ref. Range 06/03/2021 03:24 06/03/2021 04:22 06/03/2021 05:23 06/03/2021 06:23  Glucose-Capillary Latest Ref Range: 70 - 99 mg/dL 143 (H) 101 (H) 91 112 (H)  Diabetes history: Type 1 DM Outpatient Diabetes medications: Omnipod insulin pump Prepregnancy: Lantus 60 units QHS, Novolog 15 units TID Current orders for Inpatient glycemic control: iv insulin   Inpatient Diabetes Program Recommendations:    When ready to transition consider:  -Levemir 18 units two hours prior to discontinuation of IV insulin, then QD to follow -Novolog 5 units TID (assuming patient is consuming >50% of meal) -Novolog 0-9 units TID & HS.   Addendum: Spoke with patient via phone regarding plan of care, current inpatient dosages, and plan for reapplication of Omnipod. Discussed plan of care with Dr Carlis Abbott and updated Dr Marvel Plan.  Spoke with patient's endocrinology office regarding updated postpartum settings. Will plan to apply insulin pump on 9/13.  Thanks, Bronson Curb, MSN, RNC-OB Diabetes Coordinator [phone] (8a-5p)

## 2021-06-03 NOTE — Plan of Care (Signed)
  Problem: Coping: Goal: Ability to identify and utilize available resources and services will improve Outcome: Progressing
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
## 2021-06-03 NOTE — Progress Notes (Signed)
Pt set up to pump  lactation notified to see pt

## 2021-06-04 LAB — SURGICAL PATHOLOGY

## 2021-06-04 LAB — GLUCOSE, CAPILLARY
Glucose-Capillary: 116 mg/dL — ABNORMAL HIGH (ref 70–99)
Glucose-Capillary: 208 mg/dL — ABNORMAL HIGH (ref 70–99)
Glucose-Capillary: 154 mg/dL — ABNORMAL HIGH (ref 70–99)
Glucose-Capillary: 145 mg/dL — ABNORMAL HIGH (ref 70–99)
Glucose-Capillary: 153 mg/dL — ABNORMAL HIGH (ref 70–99)

## 2021-06-04 NOTE — Evaluation (Signed)
Physical Therapy Evaluation Patient Details Name: [name] MRN: [id number] DOB: 1980-12-10 Today's Date: 06/04/2021  History of Present Illness  40 y.o. female admitted on 05/30/21 for HA, elevated BP and DKA.  Initially COVID (+) at time of PT evaluation.  Pt labored for three hours with bil hips flexed to chest while pushing and ultimately had a c-section with bil tubal sterilization with distal salpingectomies on 06/02/21.  [name] of DM, DVT in pregnancy SVT, TIA, R breast lumpectomy.  Clinical Impression  Pt was able to walk a short distance down the hallway with support of RW and light support from PT.  She has difficulty lifting bil legs into and out of bed and I am planning to bring a leg lifter next session.  I issued an HEP (gentle) for her to start TID today to see if she can work some of that soreness out.   [employer] HEP: [id number]   Recommendations for follow up therapy are one component of a multi-disciplinary discharge planning process, led by the attending physician.  Recommendations may be updated based on patient status, additional functional criteria and insurance authorization.  Follow Up Recommendations No PT follow up    Equipment Recommendations  Rolling walker with 5" wheels (wide (does not need to be bari, but wide for hips))    Recommendations for Other Services OT consult     Precautions / Restrictions Precautions Precautions: Fall      Mobility  Bed Mobility Overal bed mobility: Needs Assistance Bed Mobility: Supine to Sit;Sit to Supine     Supine to sit: Min assist;Min guard;HOB elevated Sit to supine: Min assist;HOB elevated   General bed mobility comments: Min assist mostly to help manage legs, however, HOB maximally elevated and pt relying heavily on bed rail for support to come to sitting.    Transfers Overall transfer level: Needs assistance Equipment used: Rolling walker (2 wheeled) Transfers: Sit to/from Stand Sit to  Stand: Min guard         General transfer comment: min guard assist for safety initially, progressed to supervision, cues for safe hand placement.  Ambulation/Gait Ambulation/Gait assistance: Min guard Gait Distance (Feet): 75 Feet Assistive device: Rolling walker (2 wheeled) Gait Pattern/deviations: Step-through pattern;Shuffle;Trunk flexed Gait velocity: decreased Gait velocity interpretation: <1.31 ft/sec, indicative of household ambulator General Gait Details: Pt with flexed trunk posture, relying heavily on bil hands supported on RW to help bil legs.  RW adjusted down to better fit height.  Stairs            Wheelchair Mobility    Modified Rankin (Stroke Patients Only)       Balance Overall balance assessment: Mild deficits observed, not formally tested                                           Pertinent Vitals/Pain Pain Assessment: Faces Faces Pain Scale: Hurts whole lot Pain Location: bil hips and thighs Pain Descriptors / Indicators: Aching;Burning Pain Intervention(s): Limited activity within patient's tolerance;Monitored during session;Repositioned    Home Living Family/patient expects to be discharged to:: Private residence Living Arrangements: Spouse/significant other;Other relatives (24 y.o. daughter) Available Help at Discharge: Family Type of Home: House Home Access: Stairs to enter Entrance Stairs-Rails: None Entrance Stairs-Number of Steps: 5 Home Layout: One level Home Equipment: None      Prior Function Level of Independence: Independent  Hand Dominance   Dominant Hand: Right    Extremity/Trunk Assessment        Lower Extremity Assessment Lower Extremity Assessment: Generalized weakness (unable to lift legs against gravity in bed)    Cervical / Trunk Assessment Cervical / Trunk Assessment: Normal  Communication   Communication: No difficulties  Cognition Arousal/Alertness:  Awake/alert Behavior During Therapy: WFL for tasks assessed/performed Overall Cognitive Status: Within Functional Limits for tasks assessed                                        General Comments      Exercises General Exercises - Lower Extremity Ankle Circles/Pumps: AROM;Both;5 reps Quad Sets: AROM;Both;5 reps Gluteal Sets: AROM;Both;5 reps Heel Slides: AAROM;Both;5 reps   Assessment/Plan    PT Assessment Patient needs continued PT services  PT Problem List Decreased strength;Decreased activity tolerance;Decreased range of motion;Decreased balance;Decreased mobility;Decreased knowledge of use of DME;Pain;Obesity       PT Treatment Interventions DME instruction;Gait training;Stair training;Functional mobility training;Therapeutic activities;Therapeutic exercise;Balance training;Patient/family education;Modalities    PT Goals (Current goals can be found in the Care Plan section)  Acute Rehab PT Goals Patient Stated Goal: to get her hips to feel better PT Goal Formulation: With patient Time For Goal Achievement: 06/18/21 Potential to Achieve Goals: Good    Frequency Min 3X/week   Barriers to discharge Inaccessible home environment (needs a railing put up on her stairs at home, to ask her neighbor)      Co-evaluation               AM-PAC PT "6 Clicks" Mobility  Outcome Measure Help needed turning from your back to your side while in a flat bed without using bedrails?: A Little Help needed moving from lying on your back to sitting on the side of a flat bed without using bedrails?: A Little Help needed moving to and from a bed to a chair (including a wheelchair)?: A Little Help needed standing up from a chair using your arms (e.g., wheelchair or bedside chair)?: A Little Help needed to walk in hospital room?: A Little Help needed climbing 3-5 steps with a railing? : A Little 6 Click Score: 18    End of Session   Activity Tolerance: Patient limited  by pain;Patient limited by fatigue Patient left: in bed;with call bell/phone within reach Nurse Communication: Mobility status PT Visit Diagnosis: Muscle weakness (generalized) (M62.81);Difficulty in walking, not elsewhere classified (R26.2);Pain Pain - Right/Left:  (bil) Pain - part of body: Hip (bil hips)    Time: [id number] PT Time Calculation (min) (ACUTE ONLY): 58 min   Charges:   PT Evaluation $PT Eval Moderate Complexity: 1 Mod PT Treatments $Gait Training: 8-22 mins $Therapeutic Exercise: 8-22 mins $Therapeutic Activity: 8-22 mins

## 2021-06-04 NOTE — Progress Notes (Signed)
  Patient is eating, ambulating, voiding.  Pain control is good.  Vitals:   06/03/21 1508 06/03/21 1910 06/03/21 2340 06/04/21 0520  BP: (!) 141/70 (!) 149/70 139/83 (!) 147/63  Pulse: 74 77 74 75  Resp: '18 19 18 18  '$ Temp: [id number] F (36.7 C) (!) 97.4 F (36.3 C) (!) 97.5 F (36.4 C) 97.8 F (36.6 C)  TempSrc: Oral Oral Oral Oral  SpO2: 98% 99% 100% 98%  Weight:      Height:        lungs:   clear to auscultation cor:    RRR Abdomen:  soft, appropriate tenderness, incisions intact and without erythema or exudate ex:    no cords   Lab Results  Component Value Date   WBC 19.1 (H) 06/03/2021   HGB 11.9 (L) 06/03/2021   HCT 35.7 (L) 06/03/2021   MCV 93.2 06/03/2021   PLT 165 06/03/2021    --/--/A POS (09/11 1913)/RI  A/P    40 y.o. [id number] POD 2 s/p primary cesarean section at 34 weeks for arrest of descent Routine post op and postpartum care.   Severe preeclampsia--s/p magnesium sulfate x 24 hours postpartum. BPs elevated and Procardia 30 xl started. Labs stable.   2. H/o DVT during pregnancy.  Restarted theraputic lovenox 24 hours postpartum.  SCDs placed back on at bedside and discussed importance of use at this time given limited mobility 3. Bipolar 2 disorder, GAD/panic disorder / PTSD.  Sees Dr. Parke Poisson with psychiatry along with weekly counselor appointment.  Had been started on zoloft and lamictal in July, but then stopped last month.  She desires to restart at this time.   4. Type 1 diabetes.  Was discussed with diabetes coordinator.  Transitioned off of endo tool yesterday.  Will initiate their recommendation (see note).  Lauren will contact patient's endocrinologist with [facility] and plan to transition back to omnipod today. 5. Severe hip pain, bilateral.  No evidence of lower extremity weakness or deficits.  Continue scheduled ibuprofen + norco prn (multiple allergies) and flexeril prn.  Consider addition of gabapentin if needed.  Order for PT consultation today for assistance  with mobility.

## 2021-06-04 NOTE — Progress Notes (Signed)
Inpatient Diabetes Program Recommendations  AACE/ADA: New Consensus Statement on Inpatient Glycemic Control (2015)  Target Ranges:  Prepandial:   less than 140 mg/dL      Peak postprandial:   less than 180 mg/dL (1-2 hours)      Critically ill patients:  140 - 180 mg/dL   Lab Results  Component Value Date   GLUCAP 208 (H) 06/04/2021   HGBA1C 6.9 (H) 05/30/2021    Review of Glycemic Control Results for [name], [name] (MRN [id number]) as of 06/04/2021 08:18  Ref. Range 06/03/2021 21:53 06/04/2021 05:21 06/04/2021 07:36  Glucose-Capillary Latest Ref Range: 70 - 99 mg/dL 86 116 (H) 208 (H)   Diabetes history: Type 1 DM Outpatient Diabetes medications: Omnipod insulin pump Prepregnancy: Lantus 60 units QHS, Novolog 15 units TID Current orders for Inpatient glycemic control: Novolog 20-22 units TID BMZ x 2   Inpatient Diabetes Program Recommendations:    '@810'$ : Spoke with patient over phone with plan to apply Omnipod. Has all supplies and will begin charging device. Has not yet received Levemir dose this AM. In agreement with plan.  '@0815'$  talked with Dr Philis Pique regarding insulin pump settings. [facility] provider instructed to continue with current outpatient rates, however feel that patient is trending well on current regimen and application at prior rates would place patient at higher risk for hypoglycemia. Dr Philis Pique provides orders to adjust basal rate to 0.75 units/hr providing Total basal of 18 units.   '@845'$ : Spoke with patient and Therapist, sports. Patient does not have Omnipod charger and no way of having someone bring charger to patient. Plan to continue with SQ at current dosages.  Education provided to patient to follow up with endocrinology, ensure waiting atleast 18 hours following basal dose while inpatient before reapplying insulin pump and instructed on new settings of 0.75 units/hour. Patient in agreement and has no further questions at this time.   MD: At discharge would also write for SQ  insulin pens. -Levemir flexpen [id number] -Novolog flex pen [phone] -insulin pen needles [id number]  Thanks, Bronson Curb, MSN, RNC-OB Diabetes Coordinator [phone] (8a-5p)

## 2021-06-05 ENCOUNTER — Ambulatory Visit (HOSPITAL_COMMUNITY): Payer: 59 | Admitting: Psychiatry

## 2021-06-05 LAB — GLUCOSE, CAPILLARY
Glucose-Capillary: 252 mg/dL — ABNORMAL HIGH (ref 70–99)
Glucose-Capillary: 199 mg/dL — ABNORMAL HIGH (ref 70–99)

## 2021-06-05 MED ORDER — INSULIN DETEMIR 100 UNIT/ML ~~LOC~~ SOLN
30.0000 [IU] | SUBCUTANEOUS | Status: DC
  Administered 2021-06-05 – 2021-06-06 (×2): 30 [IU] via SUBCUTANEOUS
  Filled 2021-06-05 (×2): qty 0.3

## 2021-06-05 MED ORDER — INSULIN DETEMIR 100 UNIT/ML ~~LOC~~ SOLN: 30.0000 [IU] | SUBCUTANEOUS | Status: DC

## 2021-06-05 NOTE — Lactation Note (Signed)
This note was copied from a baby's chart. Lactation Consultation Note Mother continues to pump frequently but has not experienced onset of copious milk at 66-hours pp. She is aware of risk factors for low milk supply. LC will plan f/u to further assist and mark progress. Mother will pick up [facility] loaner pump in [place].   Patient Name: [name] [id number] Date: 06/05/2021 Reason for consult: Follow-up assessment Age:40 hours  Maternal Data  Pumping frequency: q3 with drops at 66-hours pp  Feeding Mother's Current Feeding Choice: Breast Milk and Donor Milk   Interventions Interventions: Education   Consult Status Consult Status: Follow-up Date: 06/05/21 Follow-up type: In-patient   Gwynne Edinger 06/05/2021, 12:30 PM

## 2021-06-05 NOTE — Evaluation (Signed)
Occupational Therapy Evaluation Patient Details Name: [name] MRN: [id number] DOB: 12/10/1980 Today's Date: 06/05/2021   History of Present Illness 40 y.o. female admitted on 05/30/21 for HA, elevated BP and DKA.  Initially COVID (+) at time of PT evaluation.  Pt labored for three hours with bil hips flexed to chest while pushing and ultimately had a c-section with bil tubal sterilization with distal salpingectomies on 06/02/21.  [name] of DM, DVT in pregnancy SVT, TIA, R breast lumpectomy.   Clinical Impression   [name]. Limited mobility and ability to stand from toilet and reach feet for ADL due to hip and thigh pain. Recommended 3 in 1 for over toilet at home and as shower seat. Instructed in use of leg lifter. Will educatedin use of AE for LB ADL next visit.      Recommendations for follow up therapy are one component of a multi-disciplinary discharge planning process, led by the attending physician.  Recommendations may be updated based on patient status, additional functional criteria and insurance authorization.   Follow Up Recommendations  No OT follow up    Equipment Recommendations  3 in 1 bedside commode    Recommendations for Other Services       Precautions / Restrictions Precautions Precautions: Fall      Mobility Bed Mobility Overal bed mobility: Needs Assistance Bed Mobility: Sit to Supine       Sit to supine: Min assist   General bed mobility comments: educated in use of leg lifter for L LE, able to get R LE into bed without assist    Transfers Overall transfer level: Needs assistance Equipment used: Rolling walker (2 wheeled);None Transfers: Sit to/from [employer] to Stand: Min guard Stand pivot transfers: Min guard            Balance Overall balance assessment: Mild deficits observed, not formally tested                                         ADL  either performed or assessed with clinical judgement   ADL Overall ADL's : Needs assistance/impaired Eating/Feeding: Independent;Sitting   Grooming: Min guard;Standing   Upper Body Bathing: Set up;Sitting   Lower Body Bathing: Sit to/from stand;Maximal assistance   Upper Body Dressing : Set up;Sitting   Lower Body Dressing: Maximal assistance;Sit to/from Archivist: Ambulation;RW;Moderate assistance   Toileting- Water quality scientist and Hygiene: Sit to/from stand;Min guard               Vision Baseline Vision/History: 1 Wears glasses Patient Visual Report: No change from baseline       Perception     Praxis      Pertinent Vitals/Pain Pain Assessment: Faces Faces Pain Scale: Hurts even more Pain Location: bil hips and thighs Pain Descriptors / Indicators: Aching;Burning Pain Intervention(s): Monitored during session;Repositioned     Hand Dominance Right   Extremity/Trunk Assessment Upper Extremity Assessment Upper Extremity Assessment: Overall WFL for tasks assessed   Lower Extremity Assessment Lower Extremity Assessment: Defer to PT evaluation   Cervical / Trunk Assessment Cervical / Trunk Assessment: Normal   Communication Communication Communication: No difficulties   Cognition Arousal/Alertness: Awake/alert Behavior During Therapy: WFL for tasks assessed/performed Overall Cognitive Status: Within Functional Limits for tasks assessed  General Comments       Exercises     Shoulder Instructions      Home Living Family/patient expects to be discharged to:: Private residence Living Arrangements: Spouse/significant other;Other relatives (74 year old daughter) Available Help at Discharge: Family Type of Home: House Home Access: Stairs to enter Technical brewer of Steps: 5 Entrance Stairs-Rails: None Home Layout: One level     Bathroom Shower/Tub: Medical illustrator: Standard     Home Equipment: None          Prior Functioning/Environment Level of Independence: Independent                 OT Problem List: Impaired balance (sitting and/or standing);Decreased knowledge of use of DME or AE;Pain      OT Treatment/Interventions: Self-care/ADL training;DME and/or AE instruction;Patient/family education;Balance training;Therapeutic activities    OT Goals(Current goals can be found in the care plan section) Acute Rehab OT Goals Patient Stated Goal: to get her hips to feel better OT Goal Formulation: With patient Time For Goal Achievement: 06/19/21 Potential to Achieve Goals: Good ADL Goals Pt Will Perform Grooming: with modified independence;standing Pt Will Perform Lower Body Bathing: with modified independence;sitting/lateral leans;with adaptive equipment Pt Will Perform Lower Body Dressing: with modified independence;sit to/from stand Pt Will Transfer to Toilet: with modified independence;ambulating;bedside commode Pt Will Perform Toileting - Clothing Manipulation and hygiene: with modified independence;sit to/from stand  OT Frequency: Min 2X/week   Barriers to D/C:            Co-evaluation              AM-PAC OT "6 Clicks" Daily Activity     Outcome Measure Help from another person eating meals?: None Help from another person taking care of personal grooming?: A Little Help from another person toileting, which includes using toliet, bedpan, or urinal?: A Lot Help from another person bathing (including washing, rinsing, drying)?: A Lot Help from another person to put on and taking off regular upper body clothing?: A Little Help from another person to put on and taking off regular lower body clothing?: A Lot 6 Click Score: 16   End of Session Equipment Utilized During Treatment: Gait belt  Activity Tolerance: Patient tolerated treatment well Patient left: in bed;with call bell/phone within reach;with  family/visitor present  OT Visit Diagnosis: Other abnormalities of gait and mobility (R26.89);Unsteadiness on feet (R26.81);Pain                Time: 1435-1457 OT Time Calculation (min): 22 min Charges:  OT General Charges $OT Visit: 1 Visit OT Evaluation $OT Eval Moderate Complexity: 1 Mod  Nestor Lewandowsky, OTR/L Acute Rehabilitation Services Pager: [phone] Office: [phone]  [name] 06/05/2021, 4:41 PM

## 2021-06-05 NOTE — Progress Notes (Signed)
POSTPARTUM POSTOP PROGRESS NOTE  POD #3  Subjective:  No acute events overnight.  Pt denies problems with voiding or po intake.  She denies nausea or vomiting.  Pain is well controlled.  She has had flatus. She has had bowel movement.  Lochia Minimal. States she walked a lot yesterday with PT, feels sore this AM and has not ambulated as much. Stressed the importance of continued mobility to achieve discharge and also prevent further DVT formation  Baby boy doing well in NICU. Currently on bili blanket, taking increased mL feeds. Confirmed aortic arch right-sided but seems stable. Patient working with lactation in order to build up supply   Objective: Blood pressure (!) 145/69, pulse 90, temperature 98 F (36.7 C), temperature source Oral, resp. rate 17, height '5\' 5"'$  (1.651 m), weight 100.7 kg, SpO2 99 %.  Physical Exam:  General: alert, cooperative and no distress Lochia:normal flow Chest: CTAB Heart: RRR no m/r/g Abdomen: +BS, soft, nontender Uterine Fundus: firm, 1cm below umbilicus. Honeycomb dressing intact, negdrainage Extremities: 1+ edema, neg calf TTP BL, neg Homans BL  Recent Labs    06/02/21 1913 06/03/21 0347  HGB 12.8 11.9*  HCT 37.2 35.7*    Assessment/Plan:  ASSESSMENT: [name] is a 40 y.o. [id number] s/p PLTCS/BTL @ 39w6dfor arrest of descent section at 34 weeks for arrest of descent   Severe preeclampsia--s/p magnesium sulfate x 24 hours postpartum. Bps stable at 140s/90s on Procardia 30XL. Patient asymptomatic this morning 2. H/o DVT during pregnancy.  Restarted theraputic lovenox 24 hours postpartum.  SCDs placed back on at bedside. 3. Bipolar 2 disorder, GAD/panic disorder / PTSD.  Sees Dr. CParke Poissonwith psychiatry along with weekly counselor appointment.  Has restarted both lamictal and Zoloft at this time  4. Type 1 diabetes.  Was discussed with diabetes coordinator.  Transitioned off of endo tool POD#1.  Will continue their recommendation (see note).   Lauren will contact patient's endocrinologist with [facility] and plan to transition back to omnipod today.    *Levemir increased to 30u today, plan to monitor per protocol. Patient will be able to transition back to Omnipod once discharged home 5. Severe hip pain, bilateral.  No evidence of lower extremity weakness or deficits.  Continue scheduled ibuprofen + norco prn (multiple allergies) and flexeril prn.  S/p PT evaluation on 9/13, appreciate consult    *Exercises provided, patient now with walker at bedside. Continues to have no focal deficits on bedside exam. Reviewed importance of regular ambulation in order to be discharged home  Once patient regularly ambulating with pain controlled, would be able to be discharged home. Would need BP/incision check approx 1wk out with routine 6wk appt afterwards.    LOS: 5 days

## 2021-06-05 NOTE — Clinical Social Work Maternal (Signed)
CLINICAL SOCIAL WORK MATERNAL/CHILD NOTE  Patient Details  Name: [name] MRN: [id number] Date of Birth: 04/26/1981  Date:  2021/02/20  Clinical Social Worker Initiating Note:  Abundio Miu, [place] Date/Time: Initiated:  Sep 14, 2021/ 11:30  Child's Name:  [name]   Biological Parents:  Mother, Father (Father: Sports administrator)   Need for Interpreter:  None   Reason for Referral:  Behavioral Health Concerns, Other (Comment) (Infant's NICU admission)   Address:  [place] [address]    Phone number:  [phone] (home)     Additional phone number:   Household Members/Support Persons (HM/SP):   Household Member/Support Person 1, Household Member/Support Person 2   HM/SP Name Relationship DOB or Age  HM/SP -1 Elvis Shean FOB    HM/SP -2 McKenzie Welchel daughter 04/04/09  HM/SP -3        HM/SP -4        HM/SP -5        HM/SP -6        HM/SP -7        HM/SP -8          Natural Supports (not living in the home):  Parent, Immediate Family, Friends   Professional Supports: Therapist   Employment: Part-time   Type of Work: Music therapist   Education:  Forensic psychologist   Homebound arranged:    Museum/gallery curator Resources:  Medicaid   Other Resources:  Physicist, medical  , [place] Considerations Which May Impact Care:    Strengths:  Understanding of illness, Engineer, materials, Ability to meet basic needs  , Home prepared for child     Psychotropic Medications:         Pediatrician:    [place]  Pediatrician List:   [place] Other ([facility])  [facility]      Pediatrician Fax Number:    Risk Factors/Current Problems:  Mental Health Concerns     Cognitive State:  Alert  , Able to Concentrate  , Insightful  , Goal Oriented  , Linear Thinking     Mood/Affect:  Calm  , Comfortable  , Interested  , Relaxed     CSW  Assessment: CSW met with MOB at bedside to complete psychosocial assessment, FOB present. CSW introduced self and explained role. Parents were welcoming, pleasant and remained engaged during assessment. MOB reported that she resides with FOB and daughter. 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 MOB reported that she receives both [facility] and food stamps. MOB reported that they have most items needed to care for infant including a car seat, crib and basinet. CSW informed parents about Family Support Network [employer] if any assistance is needed obtaining items for infant. MOB reported that assistance with diapers, bottles and wipes would be helpful, CSW agreed to make a referral. CSW inquired about parents support system, parents reported that FOB's mom, MOB's mom, MOB's sister, MOB's sister's girlfriend, and friends are supports.   CSW provided review of Sudden Infant Death Syndrome (SIDS) precautions.    CSW and parents discussed infant's NICU admission. CSW informed parents about the NICU, what to expect, and resources/supports available while infant is admitted to the NICU. MOB reported that the nurse has been amazing and they feel well informed about infant's care. Parents denied any transportation barriers and shared that a friend  recently gave them a car. MOB reported that meal vouchers would be helpful, CSW agreed to place meal vouchers at infant's bedside. Parents denied any questions/concerns regarding infant's NICU admission.   CSW asked FOB to leave the room to speak with MOB privately, FOB left the room.   CSW inquired about MOB's mental health history. MOB reported that she was diagnosed with Bipolar Disorder, PTSD, Depression, and Anxiety in 2020. MOB reported that she is currently taking Lamictal and Zoloft which are helpful. MOB reported that she is also participating in therapy at [facility]. MOB reported that she has an appointment  today at 2pm via telephone with her therapist. MOB shared that therapy is helpful. MOB denied any current symptoms of her mental health diagnoses. MOB reported that her doctor said she experienced postpartum depression after her daughter but she did not feel like she had postpartum depression. CSW inquired about how MOB was feeling emotionally after giving birth, MOB reported that she was feeling fine. MOB presented calm and did not demonstrate any acute mental health signs/symptoms. CSW assessed for safety, MOB denied SI, HI, and domestic violence.   CSW provided education regarding the baby blues period vs. perinatal mood disorders, discussed treatment and gave resources for mental health follow up if concerns arise.  CSW recommends self-evaluation during the postpartum time period using the New Mom Checklist from Postpartum Progress and encouraged MOB to contact a medical professional if symptoms are noted at any time.    CSW completed FSN referral for requested items.   CSW will continue to offer resources/supports while infant is admitted to the NICU.   CSW placed 8 meal vouchers at infant's bedside.   CSW Plan/Description:  Sudden Infant Death Syndrome (SIDS) Education, Perinatal Mood and Anxiety Disorder (PMADs) Education, Psychosocial Support and Ongoing Assessment of Needs, Other Patient/Family Education    Amarissa Koerner L Lashala Laser, LCSW 06/05/2021, 2:55 PM 

## 2021-06-05 NOTE — Progress Notes (Signed)
Inpatient Diabetes Program Recommendations  AACE/ADA: New Consensus Statement on Inpatient Glycemic Control (2015)  Target Ranges:  Prepandial:   less than 140 mg/dL      Peak postprandial:   less than 180 mg/dL (1-2 hours)      Critically ill patients:  140 - 180 mg/dL   Lab Results  Component Value Date   GLUCAP 252 (H) 06/05/2021   HGBA1C 6.9 (H) 05/30/2021    Review of Glycemic Control Results for [name], [name] (MRN [id number]) as of 06/05/2021 09:36  Ref. Range 06/04/2021 12:46 06/04/2021 17:26 06/04/2021 22:46 06/05/2021 07:31  Glucose-Capillary Latest Ref Range: 70 - 99 mg/dL 154 (H) 145 (H) 153 (H) 252 (H)   Diabetes history: Type 1 DM Outpatient Diabetes medications: Omnipod insulin pump Prepregnancy: Lantus 60 units QHS, Novolog 15 units TID Current orders for Inpatient glycemic control: Novolog 5 units TID, Levemir 18 units QD, Novolog 0-9 units TID & HS   Inpatient Diabetes Program Recommendations:    Consider increasing Levemir to 30 units QD.   Thanks, Bronson Curb, MSN, RNC-OB Diabetes Coordinator [phone] (8a-5p)

## 2021-06-06 ENCOUNTER — Inpatient Hospital Stay (HOSPITAL_COMMUNITY): Payer: Medicaid Other

## 2021-06-06 DIAGNOSIS — M79605 Pain in left leg: Secondary | ICD-10-CM

## 2021-06-06 LAB — GLUCOSE, CAPILLARY
Glucose-Capillary: 155 mg/dL — ABNORMAL HIGH (ref 70–99)
Glucose-Capillary: 74 mg/dL (ref 70–99)
Glucose-Capillary: 285 mg/dL — ABNORMAL HIGH (ref 70–99)
Glucose-Capillary: 274 mg/dL — ABNORMAL HIGH (ref 70–99)

## 2021-06-06 MED ORDER — NIFEDIPINE ER OSMOTIC RELEASE 60 MG PO TB24
60.0000 mg | ORAL_TABLET | Freq: Every day | ORAL | Status: DC
  Administered 2021-06-06: 60 mg via ORAL
  Filled 2021-06-06 (×2): qty 1

## 2021-06-06 MED ORDER — INSULIN PUMP
Freq: Three times a day (TID) | SUBCUTANEOUS | Status: DC
  Filled 2021-06-06: qty 1

## 2021-06-06 NOTE — Progress Notes (Addendum)
Inpatient Diabetes Program Recommendations  AACE/ADA: New Consensus Statement on Inpatient Glycemic Control (2015)  Target Ranges:  Prepandial:   less than 140 mg/dL      Peak postprandial:   less than 180 mg/dL (1-2 hours)      Critically ill patients:  140 - 180 mg/dL   Lab Results  Component Value Date   GLUCAP 274 (H) 06/06/2021   HGBA1C 6.9 (H) 05/30/2021    Review of Glycemic Control Results for [name], [name] (MRN [id number]) as of 06/06/2021 11:03  Ref. Range 06/05/2021 18:20 06/05/2021 22:54 06/06/2021 06:43 06/06/2021 08:05  Glucose-Capillary Latest Ref Range: 70 - 99 mg/dL 155 (H) 74 285 (H) 274 (H)  Diabetes history: Type 1 DM Outpatient Diabetes medications: Omnipod insulin pump Prepregnancy: Lantus 60 units QHS, Novolog 15 units TID Current orders for Inpatient glycemic control: Novolog 5 units TID, Levemir 18 units QD, Novolog 0-9 units TID & HS  Addendum:  Per MD, patient can resume insulin pump today.  She has supplies but just needs to charge her PDA and states she can restart tonight insulin pump tonight at 6pm.  Insulin pump settings are:  Basal insulin  12A      1.35 units/hour 5A        1.40 units/hour Total daily basal insulin: 33.25 units/24 hours Carb Coverage 1:12     1 unit for every 12 grams of carbohydrates Insulin Sensitivity 1:10     1 unit drops blood glucose 10 mg/dl Target Glucose Goals 0000-0000       100 mg/dl  1500 Patient has insulin pump PDA on charger and is prepared to restart insulin pump at 6 pm.  Spoke to her and her significant other.  Reminded her that she still has Levemir on board and will need to monitor closely once insulin pump resumed.  Patient states she has Dexcom sensor at home as well that she will restart when she gets home.  Reminded her of increased risk for hypoglycemia with pumping/breast feeding.  Also told patient to f/u with endocrinology regarding insulin pump settings and blood sugar management.  She was appreciative of  visit.

## 2021-06-06 NOTE — Progress Notes (Signed)
Left lower extremity venous duplex completed. Refer to "CV Proc" under chart review to view preliminary results.  06/06/2021 3:11 PM Kelby Aline., MHA, RVT, RDCS, RDMS

## 2021-06-06 NOTE — Lactation Note (Signed)
This note was copied from a baby's chart. Lactation Consultation Note  Patient Name: [name] [id number] Date: 06/06/2021 Reason for consult: Follow-up assessment;NICU baby;Late-preterm 34-36.6wks Age:40 days  I followed up with [name] and her 54 hour old son, [name]. She is still admitted, but I visited her in baby's room. [name] states that she is pumping consistently. She is not seeing any measurable change in her milk volume. I encouraged her to allow several more days to Fitzgerald increases due to her maternal health status (pre-e). She has all needed supplies at this time.  Feeding Mother's Current Feeding Choice: Breast Milk and Donor Milk  Lactation Tools Discussed/Used Breast pump type: Double-Electric Breast Pump Pump Education: Setup, frequency, and cleaning Reason for Pumping: NICU Pumping frequency: q 3 hours Pumped volume: 0 mL  Interventions Interventions: Education  Consult Status Consult Status: Follow-up Date: 06/06/21 Follow-up type: In-patient    Lenore Manner 06/06/2021, 10:49 AM

## 2021-06-06 NOTE — Progress Notes (Signed)
Occupational Therapy Treatment Patient Details Name: [name] MRN: [id number] DOB: May 31, 1981 Today's Date: 06/06/2021   History of present illness 41 y.o. female admitted on 05/30/21 for HA, elevated BP and DKA.  Initially COVID (+) at time of PT evaluation.  Pt labored for three hours with bil hips flexed to chest while pushing and ultimately had a c-section with bil tubal sterilization with distal salpingectomies on 06/02/21.  [name] of DM, DVT in pregnancy SVT, TIA, R breast lumpectomy.   OT comments  Pt reports less pain with use of RW when walking to bathroom. Educated and issued AE for LB ADL and reinforced use of 3 in 1 over toilet to raise surface and allow her to push with her arms and as shower seat. Pt verbalizing understanding.    Recommendations for follow up therapy are one component of a multi-disciplinary discharge planning process, led by the attending physician.  Recommendations may be updated based on patient status, additional functional criteria and insurance authorization.    Follow Up Recommendations  No OT follow up    Equipment Recommendations  3 in 1 bedside commode    Recommendations for Other Services      Precautions / Restrictions Precautions Precautions: Fall       Mobility Bed Mobility Overal bed mobility: Modified Independent             General bed mobility comments: HOB up    Transfers Overall transfer level: Needs assistance Equipment used: Rolling walker (2 wheeled) Transfers: Sit to/from Stand Sit to Stand: Supervision         General transfer comment: reports walking to bathroom without RW and regretting it as not using AD resulted in more pain    Balance Overall balance assessment: Mild deficits observed, not formally tested                                         ADL either performed or assessed with clinical judgement   ADL Overall ADL's : Needs assistance/impaired     Grooming:  Supervision/safety;Standing         Lower Body Bathing Details (indicate cue type and reason): educated in use of reacher and long handled bath sponge for reaching feet Upper Body Dressing : Set up;Sitting     Lower Body Dressing Details (indicate cue type and reason): educated in use of reacher and sock aid Toilet Transfer: Supervision/safety;RW;Ambulation           Functional mobility during ADLs: Supervision/safety;Rolling walker General ADL Comments: Reinforced education in multiple uses of 3 in 1.     Vision       Perception     Praxis      Cognition Arousal/Alertness: Awake/alert Behavior During Therapy: WFL for tasks assessed/performed Overall Cognitive Status: Within Functional Limits for tasks assessed                                          Exercises     Shoulder Instructions       General Comments      Pertinent Vitals/ Pain       Pain Assessment: Faces Faces Pain Scale: Hurts even more Pain Location: bil hips and thighs Pain Descriptors / Indicators: Aching;Burning Pain Intervention(s): Monitored during session;Repositioned  Home Living  Prior Functioning/Environment              Frequency  Min 2X/week        Progress Toward Goals  OT Goals(current goals can now be found in the care plan section)  Progress towards OT goals: Progressing toward goals  Acute Rehab OT Goals Patient Stated Goal: to get her hips to feel better OT Goal Formulation: With patient Time For Goal Achievement: 06/19/21 Potential to Achieve Goals: Good  Plan Discharge plan remains appropriate    Co-evaluation                 AM-PAC OT "6 Clicks" Daily Activity     Outcome Measure   Help from another person eating meals?: None Help from another person taking care of personal grooming?: A Little Help from another person toileting, which includes using toliet, bedpan, or  urinal?: A Little Help from another person bathing (including washing, rinsing, drying)?: A Little Help from another person to put on and taking off regular upper body clothing?: None Help from another person to put on and taking off regular lower body clothing?: A Little 6 Click Score: 20    End of Session Equipment Utilized During Treatment: Gait belt;Rolling walker  OT Visit Diagnosis: Other abnormalities of gait and mobility (R26.89);Unsteadiness on feet (R26.81);Pain   Activity Tolerance Patient tolerated treatment well   Patient Left in bed;with call bell/phone within reach;with family/visitor present   Nurse Communication          Time: [id number] OT Time Calculation (min): 19 min  Charges: OT General Charges $OT Visit: 1 Visit OT Treatments $Self Care/Home Management : 8-22 mins  Nestor Lewandowsky, OTR/L Acute Rehabilitation Services Pager: [phone] Office: [phone]  Malka So 06/06/2021, 9:00 AM

## 2021-06-06 NOTE — Progress Notes (Addendum)
Physical Therapy Treatment Patient Details Name: [name] MRN: [id number] DOB: Feb 11, 1981 Today's Date: 06/06/2021   History of Present Illness 40 y.o. female admitted on 05/30/21 for HA, elevated BP and DKA.  Initially COVID (+) at time of PT evaluation.  Pt labored for three hours with bil hips flexed to chest while pushing and ultimately had a c-section with bil tubal sterilization with distal salpingectomies on 06/02/21.  [name] of DM, DVT in pregnancy SVT, TIA, R breast lumpectomy.    PT Comments    Pt making steady progress. Continues to have hip pain but can mobilize household distances with walker. Encouraged pt to amb short distances frequently. Expect it will take time for hip pain to totally resolve but should be able to manage at home.    Recommendations for follow up therapy are one component of a multi-disciplinary discharge planning process, led by the attending physician.  Recommendations may be updated based on patient status, additional functional criteria and insurance authorization.  Follow Up Recommendations  No PT follow up     Equipment Recommendations  Rolling walker with 5" wheels (preferably a little wider for hip width (doesn't need bariatric))    Recommendations for Other Services       Precautions / Restrictions Precautions Precautions: None     Mobility  Bed Mobility Overal bed mobility: Modified Independent Bed Mobility: Supine to Sit     Supine to sit: Modified independent (Device/Increase time);HOB elevated          Transfers   Equipment used: Rolling walker (2 wheeled) Transfers: Sit to/from Stand Sit to Stand: Modified independent (Device/Increase time)            Ambulation/Gait Ambulation/Gait assistance: Supervision Gait Distance (Feet): 100 Feet Assistive device: Rolling walker (2 wheeled) Gait Pattern/deviations: Step-through pattern;Decreased stance time - left;Antalgic Gait velocity: decreased Gait  velocity interpretation: <1.31 ft/sec, indicative of household ambulator General Gait Details: Pt with slow, steady gait. Hip pain incr with incr distance.   Stairs Stairs: Yes Stairs assistance: Min assist;Min guard Stair Management: With walker (hand held) Number of Stairs: 2 (1 x 2) General stair comments: Used single portable step. Up/down one step using walker over step with min guard and verbal cues for sequencing. Up/down 1 step again with hand held assist since pt doesn't have railings at home.   Wheelchair Mobility    Modified Rankin (Stroke Patients Only)       Balance Overall balance assessment: No apparent balance deficits (not formally assessed)                                          Cognition Arousal/Alertness: Awake/alert Behavior During Therapy: WFL for tasks assessed/performed Overall Cognitive Status: Within Functional Limits for tasks assessed                                        Exercises      General Comments        Pertinent Vitals/Pain Pain Assessment: Faces Faces Pain Scale: Hurts even more Pain Location: bil hips and thighs Pain Descriptors / Indicators: Aching;Burning Pain Intervention(s): Limited activity within patient's tolerance;Monitored during session    Home Living  Prior Function            PT Goals (current goals can now be found in the care plan section) Acute Rehab PT Goals Patient Stated Goal: to get her hips to feel better Progress towards PT goals: Progressing toward goals    Frequency    Min 3X/week      PT Plan Current plan remains appropriate    Co-evaluation              AM-PAC PT "6 Clicks" Mobility   Outcome Measure  Help needed turning from your back to your side while in a flat bed without using bedrails?: None Help needed moving from lying on your back to sitting on the side of a flat bed without using bedrails?: A  Little Help needed moving to and from a bed to a chair (including a wheelchair)?: None Help needed standing up from a chair using your arms (e.g., wheelchair or bedside chair)?: None Help needed to walk in hospital room?: None Help needed climbing 3-5 steps with a railing? : A Little 6 Click Score: 22    End of Session   Activity Tolerance: Patient limited by pain Patient left: in bed;with call bell/phone within reach (sitting eob)   PT Visit Diagnosis: Muscle weakness (generalized) (M62.81);Difficulty in walking, not elsewhere classified (R26.2);Pain Pain - part of body: Hip (bialteral - lt > rt)     Time: [id number] PT Time Calculation (min) (ACUTE ONLY): 21 min  Charges:  $Gait Training: 8-22 mins                     [place] Pager [phone] Office [place] 06/06/2021, 2:07 PM

## 2021-06-06 NOTE — Progress Notes (Signed)
Hypoglycemic Event  CBG: 64 at 1733  Treatment: 4 oz juice/soda  Symptoms: Shaky  Follow-up CBG: Time:1753  CBG Result:71  Possible Reasons for Event: Medication regimen: pt given 30 levemir units this morning  Comments/MD notified:protocol followed    [place]

## 2021-06-06 NOTE — Progress Notes (Signed)
Pt's CBG before lunch was 203. Blood sugar result is not transferring to Epic at this time.

## 2021-06-06 NOTE — Progress Notes (Addendum)
Hypoglycemic Event  CBG: 57 at 2045  Treatment: 4 oz juice/soda  Symptoms: Shaky and weak  Follow-up CBG: Time:2105 CBG Result:56  Possible Reasons for Event: Medication regimen: Morning Levimir dose and Omnipod placement in afternoon.  Comments/MD notified:Dr. Narda Amber d [place]

## 2021-06-06 NOTE — Progress Notes (Signed)
Subjective: Postpartum Day 4: Cesarean Delivery Patient reports continued bilateral hip pain, left>right, and difficulty ambulating. Has been ambulating minimally with assistance. New complaint of left lower extremity pain, posterior knee. No incisional pain. Minimal lochia. Tolerating PO without nausea or vomiting.   Objective: Patient Vitals for the past 24 hrs:  BP Temp Temp src Pulse Resp SpO2  06/06/21 1157 (!) 143/68 98.1 F (36.7 C) Oral 76 18 98 %  06/06/21 0807 (!) 159/72 98.2 F (36.8 C) Oral 69 18 99 %  06/06/21 0429 (!) 151/75 98 F (36.7 C) Oral 86 18 100 %  06/05/21 2348 136/68 97.7 F (36.5 C) Oral 76 20 98 %  06/05/21 2015 132/61 98.3 F (36.8 C) Oral 83 17 98 %  06/05/21 1557 (!) 148/63 98.2 F (36.8 C) Oral 68 16 100 %     Physical Exam:  General: alert, cooperative, and no distress Lochia: appropriate Uterine Fundus: firm Incision: healing well, no significant drainage, no dehiscence, no significant erythema DVT Evaluation: tenderness to palpation of left popliteal fossa, no erythema or cords. Bilateral lower extremities are symmetrically edematous.  Hips: tender to palpation at bilateral hips  No results for input(s): HGB, HCT in the last 72 hours.  Assessment/Plan: [name] is a 40 y.o. [phone] s/p PLTCS/BTL @ 61w6dfor arrest of descent section at 34 weeks     Severe preeclampsia--s/p magnesium sulfate x 24 hours postpartum. Bps uptrending overnight to [id number], Procardia increased this AM to '60mg'$  daily with good response so far/  Continue to monitor BPs 2. H/o DVT during pregnancy (left femoral and popliteal).  Restarted theraputic lovenox 24 hours postpartum.  SCDs placed back on at bedside. - New pain left popliteal fossa - [place]LE [place] 3. Bipolar 2 disorder, GAD/panic disorder / PTSD.  Sees Dr. CParke Poissonwith psychiatry along with weekly counselor appointment.  Has restarted both lamictal and Zoloft at this time. S/p social work consult. 4.  Type 1 diabetes.  Was discussed with diabetes coordinator.  Transitioned off of endo tool POD#1.  Will continue their recommendation (see note).   - Elevated fasting this AM 285, covered with lantus per protocol. Discussed with diabetes coordinator, will plan to switch to Omnipod this evening at 6pm today, patient has plan in place for omnipod settings.  5. Severe hip pain, bilateral.  No evidence of lower extremity weakness or deficits.  Continue scheduled ibuprofen + norco prn (multiple allergies) and flexeril prn.  S/p PT evaluation on 9/13, will request PT to return today for additional evaluation.   - Exercises provided, patient now with walker at bedside. Continues to have no focal deficits on bedside exam. Reviewed importance of regular ambulation in order to be discharged home  6. Dispo: currently unable to discharge patient home due to poor glucose control, elevated blood pressures, new left leg pain, and poor pain control/limited ambulation. Working toward addressing these issues with increased antihypertensive, switching back to Omnipod, ruling out new DVT with LE doppler, and another PT evaluation. Doubt these issues will all be resolved by this evening and patient will likely need to remain inpatient.    Once patient regularly ambulating with pain controlled, blood pressures and sugars controlled, and no signs of new DVT,  would be able to be discharged home. Would need BP/incision check approx 1wk out with routine 6wk appt afterwards.   MRowland Lathe9/15/2022, 1:25 PM

## 2021-06-06 NOTE — Progress Notes (Signed)
Pt called out stating she felt as if her blood sugar was dropping. CBG checked for a result of 57 at 2045. Pt drank 4 oz orange juice and CBG rechecked in 15 minutes with a result of 56. A second 4 oz orange juice was given and patient ate a Hot Pocket (provided by [facility]). Pt also paused Omnipod insulin pump. Notified MD, Dr. Brien Mates and was advised to leave insulin pump off until next check at 2-3 am. Will continue to monitor for s/s of hypo/hyperglycemia.

## 2021-06-07 LAB — GLUCOSE, CAPILLARY
Glucose-Capillary: 203 mg/dL — ABNORMAL HIGH (ref 70–99)
Glucose-Capillary: 64 mg/dL — ABNORMAL LOW (ref 70–99)
Glucose-Capillary: 71 mg/dL (ref 70–99)
Glucose-Capillary: 56 mg/dL — ABNORMAL LOW (ref 70–99)
Glucose-Capillary: 57 mg/dL — ABNORMAL LOW (ref 70–99)
Glucose-Capillary: 62 mg/dL — ABNORMAL LOW (ref 70–99)
Glucose-Capillary: 62 mg/dL — ABNORMAL LOW (ref 70–99)
Glucose-Capillary: 81 mg/dL (ref 70–99)
Glucose-Capillary: 72 mg/dL (ref 70–99)
Glucose-Capillary: 68 mg/dL — ABNORMAL LOW (ref 70–99)
Glucose-Capillary: 71 mg/dL (ref 70–99)

## 2021-06-07 MED ORDER — SERTRALINE HCL 50 MG PO TABS: 50.0000 mg | ORAL_TABLET | Freq: Every day | ORAL | 1 refills | Status: DC

## 2021-06-07 MED ORDER — CYCLOBENZAPRINE HCL 10 MG PO TABS
10.0000 mg | ORAL_TABLET | Freq: Three times a day (TID) | ORAL | 0 refills | Status: DC | PRN
Start: 2021-06-07 — End: 2021-08-19

## 2021-06-07 MED ORDER — LAMOTRIGINE 25 MG PO TABS: 25.0000 mg | ORAL_TABLET | Freq: Every day | ORAL | 1 refills | Status: DC

## 2021-06-07 MED ORDER — IBUPROFEN 600 MG PO TABS: 600.0000 mg | ORAL_TABLET | Freq: Four times a day (QID) | ORAL | 0 refills | Status: DC | PRN

## 2021-06-07 MED ORDER — NIFEDIPINE ER 60 MG PO TB24: 60.0000 mg | ORAL_TABLET | Freq: Every day | ORAL | 1 refills | Status: DC

## 2021-06-07 NOTE — Plan of Care (Signed)
Patient to be discharged home with printed instructions. Elyan Vanwieren L Jalisia Puchalski, RN  

## 2021-06-07 NOTE — Progress Notes (Signed)
Hypoglycemic Event  CBG: 62  Treatment: 4 oz juice/soda  Symptoms: None     Follow-up CBG: 82  Possible Reasons for Event: (pt was sleeping and insulin pump basal rate accidentally restarted)   Comments/MD notified: Follow up CBG WNL.    [name] d [place]

## 2021-06-07 NOTE — Progress Notes (Signed)
Pt with transportation issues stating that her ride was here and she had to leave because the ride was getting ready to leave her. Pt states that she will have no other means of transportation. Pt also left with out her discharge instructions. Toya Smothers, RN

## 2021-06-07 NOTE — Progress Notes (Signed)
Inpatient Diabetes Program Recommendations  AACE/ADA: New Consensus Statement on Inpatient Glycemic Control (2015)  Target Ranges:  Prepandial:   less than 140 mg/dL      Peak postprandial:   less than 180 mg/dL (1-2 hours)      Critically ill patients:  140 - 180 mg/dL   Lab Results  Component Value Date   GLUCAP 274 (H) 06/06/2021   HGBA1C 6.9 (H) 05/30/2021    Review of Glycemic Control Results for NANCY, GORBET (MRN [id number]) as of 06/07/2021 09:07  Ref. Range 06/05/2021 22:54 06/06/2021 06:43 06/06/2021 08:05  Glucose-Capillary Latest Ref Range: 70 - 99 mg/dL 74 285 (H) 274 (H)   Diabetes history: Type 1 DM Outpatient Diabetes medications: Omnipod insulin pump Prepregnancy: Lantus 60 units QHS, Novolog 15 units TID Current orders for Inpatient glycemic control: insulin pump  Inpatient Diabetes Program Recommendations:    Noted patient experienced several lows overnight associated with boluses using Omnipod.  Spoke with patient to verify given that she was accepting boluses for CBGs < 80 mg/dL. Patient personally went into Omnipod settings and halved her rate to 0.7 for four hours beginning this AM around 0700. Reminded patient that she had Levemir on board yesterday and that she should not correct blood sugars when CBG < 80 mg/dL.  Also, she changed target goals to 110-120 mg/dL and sensitivity factor was 1:56 (unclear where these numbers originated). Patient plans to follow up with endocrinology early next week and educated on when to call regarding cbgs while at home. Encouraged not to correct blood sugars less than 140 mg/dL until she follows up.  Followed back up with Terri, RN regarding lunch time CBG. Unknown. Patient was planning to resume basal rates given by endocrinology at 1200. Patient left prior to CBG being collected.  No further recommendations. Pt to schedule endo appointment.   Thanks, Bronson Curb, MSN, RNC-OB Diabetes Coordinator [phone] (8a-5p)

## 2021-06-07 NOTE — Care Management (Signed)
RN called CM and informed her that patient left prior to equipment arriving to unit.  RN stated that patient will come back to nursing unit tomorrow and get equipment when visiting NICU (baby).  CM called [facility] Freda Munro and asked her to deliver to unit today and Terri will mark with patient's name on it for her to get tomorrow.  Rosita Fire RNC-MNN, BSN Transitions of Care Pediatrics/Women's and [place]

## 2021-06-07 NOTE — Progress Notes (Signed)
Subjective: Postpartum Day 5: Cesarean Delivery Patient reports no incisional pain. Still having bilateral hip and lower leg pain. Mobility improving, walking with walker. BS low overnight. Omnipod restarted yesterday after battery obtained. Initially restarted at pre-pregnancy basal dose per pts endo office recommendation. This am pt has cut basal rate in half. Pt denies feeling hypoglycemic with BS lows. Baby in NICU, improving  Objective: Vital signs in last 24 hours: Temp:  [98 F (36.7 C)-98.4 F (36.9 C)] 98 F (36.7 C) (09/16 0805) Pulse Rate:  [71-89] 71 (09/16 0805) Resp:  [16-18] 16 (09/16 0805) BP: (131-150)/(67-71) 148/70 (09/16 0805) SpO2:  [98 %-100 %] 100 % (09/16 0805)  Vitals:   06/06/21 1943 06/06/21 2355 06/07/21 0414 06/07/21 0805  BP: 135/69 131/67 (!) 150/71 (!) 148/70  Pulse: 86 82 82 71  Resp: '16 18 16 16  '$ Temp: 98.1 F (36.7 C) 98 F (36.7 C) 98.2 F (36.8 C) 98 F (36.7 C)  TempSrc: Oral Oral Oral Oral  SpO2: 100% 100% 99% 100%  Weight:      Height:         Physical Exam:  General: alert, cooperative, and appears stated age 40: appropriate Uterine Fundus: firm Incision: healing well DVT Evaluation: No evidence of DVT seen on physical exam.      Assessment/Plan: Severe preeclampsia--s/p magnesium sulfate x 24 hours postpartum. Bps uptrending overnight to [id number] systolic, Procardia increased this AM to '60mg'$  daily with good response so far/  Continue to monitor BPs 2. H/o DVT during pregnancy (left femoral and popliteal).  Restarted theraputic lovenox 24 hours postpartum.  SCDs placed back on at bedside.  - LE doppler 9/15 negative 3. Bipolar 2 disorder, GAD/panic disorder / PTSD.  Sees Dr. Parke Poisson with psychiatry along with weekly counselor appointment.  Has restarted both lamictal and Zoloft at this time. S/p social work consult. 4. Type 1 diabetes.  Was discussed with diabetes coordinator.  Transitioned off of endo tool POD#1.  Will continue  their recommendation (see note).    - Omnipod restarted yesterday after battery obtained. Initially restarted at pre-pregnancy basal dose per pts endo office recommendation. This am pt has cut basal rate in half.  5. Severe hip pain, bilateral.  No evidence of lower extremity weakness or deficits.  Continue scheduled ibuprofen + norco prn (multiple allergies) and flexeril prn.    - Exercises provided, patient now with walker at bedside. Continues to have no focal deficits on bedside exam. Reviewed importance of regular ambulation in order to be discharged home  Anticipate discharge home later today.  Awaiting diabetes care coordinator seeing patient for additional recommendations with Omnipod settings Vanessa Kick 06/07/2021, 9:55 AM

## 2021-06-07 NOTE — Care Management Note (Signed)
Case Management Note  Patient Details  Name: [name] MRN: [id number] Date of Birth: 05-24-1981  Subjective/Objective:                   Postpartum Day 5: Cesarean Delivery Patient reports no incisional pain. Still having bilateral hip and lower leg pain. Mobility improving, walking with walker Action/Plan:  DME for home  Discharge planning Services  CM Consult    DME Arranged:  3-N-1, Walker rolling DME Agency:  [facility]   Additional Comments: CM received consult for dme equipment.  Orders for 3n1 and rolling walker w/inch wheels.  CM called Freda Munro with [employer] ph# [phone] with referral and she accepted referral and will deliver to patient's room prior to discharge today.  Patient has MCAID and no barriers for medication needs.    Yong Channel, RN 06/07/2021, 12:06 PM

## 2021-06-07 NOTE — Progress Notes (Signed)
CBG: 68 Symptoms: none Treatment: 4oz juice  Toya Smothers, RN

## 2021-06-07 NOTE — Progress Notes (Signed)
Follow up CBG 71

## 2021-06-07 NOTE — Discharge Summary (Signed)
Postpartum Discharge Summary       Patient Name: [name] DOB: [birth year] MRN: [id number]  Date of admission: 05/30/2021 Delivery date:06/02/2021  Delivering provider: Paula Compton  Date of discharge: 06/07/2021  Admitting diagnosis: PIH (pregnancy induced hypertension), third trimester [O13.3] Preeclampsia, severe [O14.10] S/P primary low transverse C-section [id number] Intrauterine pregnancy: 106w1d    Secondary diagnosis:  Active Problems:   PIH (pregnancy induced hypertension), third trimester   Preeclampsia, severe   S/P primary low transverse C-section  Additional problems: Type I diabetic, history of DVT during pregnancy x2, preterm delivery    Discharge diagnosis: Type I diabetic, history of DVT during pregnancy x2, preterm delivery                                              Post partum procedures: Physical therapy consultation, diabetic coordinator consultation and management, lower extremity vascular Dopplers, Augmentation: AROM, Pitocin, and Cytotec Complications: None  Hospital course: Patient was admitted to antenatal for preeclampsia.  She developed severe features and the decision was made to proceed with induction of labor.  She progressed to complete and pushing pain during pushing became severe and maternal expulsive efforts became ineffective.  Epidural anesthesia was redosed, however additional maternal pushing efforts were ineffective the decision was made to proceed with cesarean delivery.  Postoperatively in the PACU the patient was feeling anxious and claustrophobic and required IV Ativan.  She also developed hip and knee pain in the PACU.  Postop mobility was significantly limited due to this hip and knee pain.  Patient minimally ambulated for the first 2 to 3 days following surgery.  Physical therapy consultation was obtained.  Patient's pain improved with muscle relaxers.  Patient's therapeutic anticoagulation was restarted on postop day #1.  Blood  pressures were in elevated postoperatively and she was initially started on Procardia 30 mg XL.  This was increased to 60 mg XL.  By postop day #5 patient was able to ambulate with the assistance of a walker.  On postop day #4 lower extremity Dopplers were performed due to complaints of increased tenderness in her lower legs.  The patient's OmniPod was restarted on postop day #4.  OmniPod did not be used prior to this due to a dead battery.  On postop day #5 the patient's blood sugar control was improved, blood pressures were in the mild range and stable on Procardia 60 XL, and patient was deemed ready for discharge  Magnesium Sulfate received: Yes: Seizure prophylaxis BMZ received: Yes Rhophylac:N/A   Physical exam  Vitals:   06/06/21 1943 06/06/21 2355 06/07/21 0414 06/07/21 0805  BP: 135/69 131/67 (!) 150/71 (!) 148/70  Pulse: 86 82 82 71  Resp: '16 18 16 16  '$ Temp: 98.1 F (36.7 C) 98 F (36.7 C) 98.2 F (36.8 C) 98 F (36.7 C)  TempSrc: Oral Oral Oral Oral  SpO2: 100% 100% 99% 100%  Weight:      Height:       General: alert, cooperative, and no distress Lochia: appropriate Uterine Fundus: firm Incision: Healing well with no significant drainage DVT Evaluation: No evidence of DVT seen on physical exam. Labs: Lab Results  Component Value Date   WBC 19.1 (H) 06/03/2021   HGB 11.9 (L) 06/03/2021   HCT 35.7 (L) 06/03/2021   MCV 93.2 06/03/2021   PLT 165 06/03/2021  CMP Latest Ref Rng & Units 06/02/2021  Glucose 70 - 99 mg/dL 110(H)  BUN 6 - 20 mg/dL 10  Creatinine 0.44 - 1.00 mg/dL 0.81  Sodium 135 - 145 mmol/L 134(L)  Potassium 3.5 - 5.1 mmol/L 4.0  Chloride 98 - 111 mmol/L 109  CO2 22 - 32 mmol/L 19(L)  Calcium 8.9 - 10.3 mg/dL 6.8(L)  Total Protein 6.5 - 8.1 g/dL 4.5(L)  Total Bilirubin 0.3 - 1.2 mg/dL 0.4  Alkaline Phos 38 - 126 U/L 103  AST 15 - 41 U/L 38  ALT 0 - 44 U/L 30   Edinburgh Score: Edinburgh Postnatal Depression Scale Screening Tool 06/05/2021  I  have been able to laugh and see the funny side of things. 0  I have looked forward with enjoyment to things. 0  I have blamed myself unnecessarily when things went wrong. 2  I have been anxious or worried for no good reason. 2  I have felt scared or panicky for no good reason. 2  Things have been getting on top of me. 1  I have been so unhappy that I have had difficulty sleeping. 1  I have felt sad or miserable. 1  I have been so unhappy that I have been crying. 0  The thought of harming myself has occurred to me. 0  Edinburgh Postnatal Depression Scale Total 9      After visit meds:  Allergies as of 06/07/2021       Reactions   Latex Hives, Shortness Of Breath, Rash   Levofloxacin Shortness Of Breath, Rash   Moxifloxacin Shortness Of Breath, Rash   Oxycodone-acetaminophen Shortness Of Breath, Swelling, Rash   NORCO/VICODIN OK   Peach [prunus Persica] Hives, Shortness Of Breath   Potassium-containing Compounds Other (See Comments)   IV ROUTE - CAUSES VEINS TO COLLAPS; Reports that it is undiluted K only   Prednisone Other (See Comments)   SEVERE ELEVATION OF BLOOD SUGAR. Able to tolerate 40 mg   Propoxyphene N-acetaminophen Swelling   SWELLING OF FACE AND THROAT   Rosiglitazone Maleate Swelling   SWELLING OF FACE AND LEGS   Xolair [omalizumab] Other (See Comments)   Rash and anaphylaxis   Adhesive [tape] Hives, Itching, Rash   Morphine And Related Other (See Comments)   Causes hallucinations   Prozac [fluoxetine Hcl] Other (See Comments)   Made her very aggressive    Chantix [varenicline]    dreams   Citrullus Vulgaris Nausea And Vomiting   Facial swelling   Humira [adalimumab]    Does not remember    Septra [sulfamethoxazole-trimethoprim]    Zyvox [linezolid]    Edema    Cefaclor Rash   Keflex [cephalexin] Diarrhea, Rash   REACTION: severe migraine   Promethazine Hcl Other (See Comments)   IV ROUTE ONLY - JITTERY FEELING. Patient reports that it is mild and she  has used promethazine since then PO tablet ok   Sulfadiazine Rash        Medication List     STOP taking these medications    acetaminophen 500 MG tablet Commonly known as: TYLENOL   butalbital-acetaminophen-caffeine 50-325-40 MG tablet Commonly known as: FIORICET   docusate sodium 100 MG capsule Commonly known as: COLACE   hydroxyprogesterone caproate 250 mg/mL Oil injection Commonly known as: MAKENA   insulin glargine 100 UNIT/ML injection Commonly known as: Lantus   metoCLOPramide 10 MG tablet Commonly known as: REGLAN   ondansetron 8 MG disintegrating tablet Commonly known as: Zofran ODT   pantoprazole  40 MG tablet Commonly known as: PROTONIX       TAKE these medications    albuterol (2.5 MG/3ML) 0.083% nebulizer solution Commonly known as: PROVENTIL Take 3 mLs (2.5 mg total) by nebulization every 6 (six) hours as needed for wheezing or shortness of breath.   Baqsimi Two Pack 3 MG/DOSE Powd Generic drug: Glucagon Place 3 mg into both nostrils daily as needed. Low CBG   cyclobenzaprine 10 MG tablet Commonly known as: FLEXERIL Take 1 tablet (10 mg total) by mouth 3 (three) times daily as needed for muscle spasms.   enoxaparin 100 MG/ML injection Commonly known as: LOVENOX Inject 100 mg into the skin every 12 (twelve) hours.   HumaLOG 100 UNIT/ML injection Generic drug: insulin lispro 20-22 units pre-meal as per sliding scale What changed:  how much to take when to take this additional instructions   ibuprofen 600 MG tablet Commonly known as: ADVIL Take 1 tablet (600 mg total) by mouth every 6 (six) hours as needed.   [employer] TEST STRIPS test strip Generic drug: glucose blood 1 each by Other route See admin instructions.   lamoTRIgine 25 MG tablet Commonly known as: LAMICTAL Take 1 tablet (25 mg total) by mouth at bedtime.   NIFEdipine 60 MG 24 hr tablet Commonly known as: ADALAT CC Take 1 tablet (60 mg total) by mouth daily.   prenatal  multivitamin Tabs tablet Take 1 tablet by mouth daily at 12 noon.   sertraline 50 MG tablet Commonly known as: ZOLOFT Take 1 tablet (50 mg total) by mouth at bedtime.               Discharge Care Instructions  (From admission, onward)           Start     Ordered   06/07/21 0000  Discharge wound care:       Comments: For a cesarean delivery: You may wash incision with soap and water.  Do not soak or submerge the incision for 2 weeks. Keep incision dry. You may need to keep a sanitary pad or panty liner between the incision and your clothing for comfort and to keep the incision dry. If you note drainage, increased pain, or increased redness of the incision, then please notify your physician.   06/07/21 1245   06/07/21 0000  If the dressing is still on your incision site when you go home, remove it on the third day after your surgery date. Remove dressing if it begins to fall off, or if it is dirty or damaged before the third day.       Comments: For a cesarean delivery   06/07/21 1245             Discharge home in stable condition Infant Feeding: Bottle and Breast Infant Disposition:NICU Discharge instruction: per After Visit Summary and Postpartum booklet. Activity: Advance as tolerated. Pelvic rest for 6 weeks.  Diet: carb modified diet Anticipated Birth Control: Unsure Postpartum Appointment:2-3 days Additional Postpartum F/U:  Future Appointments: Future Appointments  Date Time Provider [place]  06/24/2021  2:30 PM Cobos, Myer Peer, MD [facility] None   Follow up Visit:      06/07/2021 Vanessa Kick, MD

## 2021-06-11 ENCOUNTER — Ambulatory Visit: Payer: Medicaid Other

## 2021-06-16 ENCOUNTER — Ambulatory Visit: Payer: Self-pay

## 2021-06-16 NOTE — Lactation Note (Signed)
This note was copied from a baby's chart. Lactation Consultation Note  Patient Name: [name] [id number] Date: 06/16/2021 Reason for consult: Follow-up assessment;NICU baby;Late-preterm 34-36.6wks;Infant < 6lbs Age:40 wk.o.  Visited with mom of 39 43/94 weeks old (adjusted) NICU female, she reported that she's no longer getting any volume, she used the pump 3 times yesterday and didn't get any drops.  Reviewed her risk factors, mom still would like to try and see if she can get even small volumes for baby. Galactagogues and power pumping were discussed.  Maternal Data   Mom's supply has decreased, she's not getting any volume now; possibly due to Hx of breast cancer, biopsies and breast surgery on right breast @ 40 y.o. However, per mom "R" breast was her better producer breast Vs. The left one. She would get up to 10 ml out of her R breast and only drops out of the left one.  Feeding Mother's Current Feeding Choice: Breast Milk and Formula Nipple Type: Dr. Myra Gianotti Preemie  Lactation Tools Discussed/Used Tools: Pump Breast pump type: Double-Electric Breast Pump Pump Education: Setup, frequency, and cleaning;Milk Storage Reason for Pumping: LPI in NICU Pumping frequency: 3 times/24 hours yesterday, but otherwise has been pumping consistently q 3 hours Pumped volume: 0 mL  Interventions Interventions: Breast feeding basics reviewed;DEBP;Education  Discharge Pump: DEBP  Plan of care:  Mom will start taking Moringa and start pumping consistently She'll also power pump in the AM; will report to lactation next week  FOB present and very supportive. All questions and concerns answered, parents to call NICU LC PRN.  Consult Status Consult Status: Follow-up Date: 06/16/21 Follow-up type: In-patient  [name] 06/16/2021, 2:53 PM

## 2021-06-17 ENCOUNTER — Telehealth (HOSPITAL_COMMUNITY): Payer: Self-pay | Admitting: *Deleted

## 2021-06-17 NOTE — Telephone Encounter (Signed)
Mom reports feeling good. Mobility has improved greatly since delivery per mom. No concerns about herself. EPDS in hospital = 9. Referral to [facility] already made, but patient is waiting for appt. Mom reports she's on medication for depression.Baby is still in NICU.  Odis Hollingshead, RN 06-17-2021 at 1:50pm

## 2021-06-24 ENCOUNTER — Encounter (HOSPITAL_COMMUNITY): Payer: Self-pay | Admitting: Psychiatry

## 2021-06-24 ENCOUNTER — Telehealth (HOSPITAL_BASED_OUTPATIENT_CLINIC_OR_DEPARTMENT_OTHER): Payer: Medicaid Other | Admitting: Psychiatry

## 2021-06-24 ENCOUNTER — Other Ambulatory Visit: Payer: Self-pay

## 2021-06-24 DIAGNOSIS — F3181 Bipolar II disorder: Secondary | ICD-10-CM

## 2021-06-24 MED ORDER — LAMOTRIGINE 25 MG PO TABS: 25.0000 mg | ORAL_TABLET | Freq: Every day | ORAL | 0 refills | Status: DC

## 2021-06-24 MED ORDER — VILAZODONE HCL 10 MG PO TABS: 10.0000 mg | ORAL_TABLET | Freq: Every day | ORAL | 0 refills | Status: DC

## 2021-06-24 NOTE — Progress Notes (Signed)
[place] MD/PA/NP OP Progress Note  06/24/2021 2:38 PM [name]  MRN:  [id number] Interview was conducted by phone and I verified that I was speaking with the correct person using two identifiers. I discussed the limitations of evaluation and management by telemedicine and  the availability of in person appointments. Patient expressed understanding and agreed to proceed. Participants in the visit: patient (location - home); physician (location - home office). Duration 25 minutes   Chief Complaint:  returns for medication management   HPI: 40 yo divorced female with hx of PTSD, bipolar 2 disorder and GAD/panic disorder.    She reports she is currently doing well . She delivered her son ion 9/11 . States labor was long /complicated and that she had preeclampsia. She states she is now doing well, feeling better physically .  She reports her son has a right aortic arch ( which she reports her grandmother also had ) so is being followed by neonatologist , but thankfully is doing well, eating well and thriving. She reports she is not breastfeeding and does not intend to at this time. She states that she feels she has a strong mother/child bond with her son Jacquenette Shone) . She denies any psychotic symptoms and  none are noted . She denies having any suicidal ideations and presents future oriented. No significant neuro-vegetative symptoms reported . No anhedonia . She denies current medication side effects ( on Lamotrigine and low dose Zoloft )   She does report that now that she is post partum she would like to resume Viibryd, which she had been taking along with Lamictal in the past and which she felt was an optimal medication combination for her.                   Visit Diagnosis:  No diagnosis found.  Past Psychiatric History: Please see intake H&P.  Past Medical History:  Past Medical History:  Diagnosis Date   Abscess of left axilla - PREVOTELLA BIVIA & Staph Coag Neg  07/12/2012   MODERATE PREVOTELLA BIVIA Note: BETA LACTAMASE NEGATIVE     Asthma    as a child   Cancer ([place])    skin tag rt breast   Cellulitis 06/01/2014   RT INNER THIGH   Depression    hx   Diabetes ([place])    Diabetes mellitus    IDDM, Insulin pump; followed by Dr. [name]   DVT (deep vein thrombosis) in pregnancy    GERD (gastroesophageal reflux disease)    no current meds.   Heart murmur    Hidradenitis 04/2012   bilat. thighs, left groin - open areas on thighs   Hx MRSA infection    Hx of blood clots    in left leg   Hydradenitis    PCOS (polycystic ovarian syndrome)    Pneumonia    hx   SVT (supraventricular tachycardia) ([place]) 06/2017   TIA (transient ischemic attack) 2019   Vitamin D insufficiency     Past Surgical History:  Procedure Laterality Date   BREAST EXCISIONAL BIOPSY Right    BREAST SURGERY     right lumpectomy   BUNIONECTOMY     left   CARDIAC CATHETERIZATION     CESAREAN SECTION N/A 06/02/2021   Procedure: CESAREAN SECTION;  Surgeon: Paula Compton, MD;  Location: [facility];  Service: Obstetrics;  Laterality: N/A;   CHOLECYSTECTOMY  12/02/2006   lap. chole.   DILATION AND EVACUATION  02/14/2007   ESOPHAGOGASTRODUODENOSCOPY  11/17/2011   Procedure: ESOPHAGOGASTRODUODENOSCOPY (EGD);  Surgeon: [name], MD;  Location: [facility];  Service: Endoscopy;  Laterality: N/A;   EYE SURGERY     exc. stye left eye   HYDRADENITIS EXCISION  04/30/2012   Procedure: EXCISION HYDRADENITIS GROIN;  Surgeon: [name], MD;  Location: [facility];  Service: General;  Laterality: Left;  wide excision hidradenitis bilateral thighs and Left groin   HYDRADENITIS EXCISION Left 01/13/2014   Procedure: WIDE EXCISION HIDRADENITIS LEFT AXILLA;  Surgeon: [name], MD;  Location: [facility];  Service: General;  Laterality: Left;   INCISION AND DRAINAGE ABSCESS Right 03/17/2019   Procedure: INCISION AND DRAINAGE RIGHT THIGH ABSCESS;   Surgeon: [name], MD;  Location: [facility];  Service: General;  Laterality: Right;   IRRIGATION AND DEBRIDEMENT ABSCESS Right 06/02/2014   Procedure: IRRIGATION AND DEBRIDEMENT ABSCESS;  Surgeon: [name], MD;  Location: [facility];  Service: General;  Laterality: Right;   IRRIGATION AND DEBRIDEMENT ABSCESS Left 09/23/2014   Procedure: IRRIGATION AND DEBRIDEMENT ABSCESS/LEFT THIGH;  Surgeon: [name], MD;  Location: [facility];  Service: General;  Laterality: Left;   LEFT HEART CATHETERIZATION WITH CORONARY ANGIOGRAM N/A 07/14/2014   Procedure: LEFT HEART CATHETERIZATION WITH CORONARY ANGIOGRAM;  Surgeon: [name], MD;  Location: [facility];  Service: Cardiovascular;  Laterality: N/A;   TONSILLECTOMY     WISDOM TOOTH EXTRACTION      Family Psychiatric History: Reviewed.  Family History:  Family History  Problem Relation Age of Onset   Cancer Mother    Anxiety disorder Mother    Depression Mother    Sexual abuse Mother    Breast cancer Mother 60   Hyperlipidemia Sister    Hypertension Sister    Sexual abuse Sister    Hypertension Father    Anxiety disorder Father    Depression Father    Drug abuse Father    Stroke Paternal Uncle    ADD / ADHD Paternal Uncle    Anxiety disorder Paternal Uncle    Anxiety disorder Maternal Aunt    Seizures Maternal Aunt    Anxiety disorder Paternal Aunt    Anxiety disorder Maternal Uncle    ADD / ADHD Cousin    ADD / ADHD Daughter     Social History:  Social History   Socioeconomic History   Marital status: Married    Spouse name: Louis Meckel   Number of children: Not on file   Years of education: Not on file   Highest education level: Not on file  Occupational History   Not on file  Tobacco Use   Smoking status: Former    Packs/day: 1.00    Years: 23.00    Pack years: 23.00    Types: Cigarettes    Quit date: 09/22/2020    Years since quitting: 0.7   Smokeless tobacco: Never   Tobacco comments:    Pt reports today  that she quit smoking January 1st of this year. (2022)  Vaping Use   Vaping Use: Never used  Substance and Sexual Activity   Alcohol use: No    Alcohol/week: 0.0 standard drinks   Drug use: Not Currently    Types: Marijuana    Comment: 2020   Sexual activity: Yes    Partners: Male    Birth control/protection: I.U.D.  Other Topics Concern   Not on file  Social History Narrative   Not on file   Social Determinants of Health  Financial Resource Strain: Not on file  Food Insecurity: Not on file  Transportation Needs: Not on file  Physical Activity: Not on file  Stress: Not on file  Social Connections: Not on file    Allergies:  Allergies  Allergen Reactions   Latex Hives, Shortness Of Breath and Rash   Levofloxacin Shortness Of Breath and Rash   Moxifloxacin Shortness Of Breath and Rash   Oxycodone-Acetaminophen Shortness Of Breath, Swelling and Rash    NORCO/VICODIN OK   Peach [Prunus Persica] Hives and Shortness Of Breath   Potassium-Containing Compounds Other (See Comments)    IV ROUTE - CAUSES VEINS TO COLLAPS; Reports that it is undiluted K only   Prednisone Other (See Comments)    SEVERE ELEVATION OF BLOOD SUGAR. Able to tolerate 40 mg   Propoxyphene N-Acetaminophen Swelling    SWELLING OF FACE AND THROAT   Rosiglitazone Maleate Swelling    SWELLING OF FACE AND LEGS   Xolair [Omalizumab] Other (See Comments)    Rash and anaphylaxis   Adhesive [Tape] Hives, Itching and Rash   Morphine And Related Other (See Comments)    Causes hallucinations   Prozac [Fluoxetine Hcl] Other (See Comments)    Made her very aggressive    Chantix [Varenicline]     dreams   Citrullus Vulgaris Nausea And Vomiting    Facial swelling   Humira [Adalimumab]     Does not remember    Septra [Sulfamethoxazole-Trimethoprim]    Zyvox [Linezolid]     Edema    Cefaclor Rash   Keflex [Cephalexin] Diarrhea and Rash    REACTION: severe migraine   Promethazine Hcl Other (See Comments)     IV ROUTE ONLY - JITTERY FEELING. Patient reports that it is mild and she has used promethazine since then  PO tablet ok   Sulfadiazine Rash    Metabolic Disorder Labs: Lab Results  Component Value Date   HGBA1C 6.9 (H) 05/30/2021   MPG 151.33 05/30/2021   MPG 280.48 12/25/2017   No results found for: PROLACTIN Lab Results  Component Value Date   CHOL 193 03/22/2018   TRIG 90.0 03/22/2018   HDL 62.80 03/22/2018   CHOLHDL 3 03/22/2018   VLDL 18.0 03/22/2018   LDLCALC 113 (H) 03/22/2018   LDLCALC 88 12/19/2015   Lab Results  Component Value Date   TSH 1.43 03/22/2018   TSH 0.773 12/25/2017    Therapeutic Level Labs: No results found for: LITHIUM No results found for: VALPROATE No components found for:  CBMZ  Current Medications: Current Outpatient Medications  Medication Sig Dispense Refill   albuterol (PROVENTIL) (2.5 MG/3ML) 0.083% nebulizer solution Take 3 mLs (2.5 mg total) by nebulization every 6 (six) hours as needed for wheezing or shortness of breath. 75 mL 12   BAQSIMI TWO PACK 3 MG/DOSE POWD Place 3 mg into both nostrils daily as needed. Low CBG     cyclobenzaprine (FLEXERIL) 10 MG tablet Take 1 tablet (10 mg total) by mouth 3 (three) times daily as needed for muscle spasms. 30 tablet 0   enoxaparin (LOVENOX) 100 MG/ML injection Inject 100 mg into the skin every 12 (twelve) hours.     glucose blood ([employer] TEST STRIPS) test strip 1 each by Other route See admin instructions.      HUMALOG 100 UNIT/ML injection 20-22 units pre-meal as per sliding scale (Patient taking differently: 150 Units every 3 (three) days. Using Omnipod pump) 10 mL 11   ibuprofen (ADVIL) 600 MG tablet Take 1 tablet (  600 mg total) by mouth every 6 (six) hours as needed. 90 tablet 0   lamoTRIgine (LAMICTAL) 25 MG tablet Take 1 tablet (25 mg total) by mouth at bedtime. 30 tablet 1   NIFEdipine (ADALAT CC) 60 MG 24 hr tablet Take 1 tablet (60 mg total) by mouth daily. 30 tablet 1   Prenatal  Vit-Fe Fumarate-FA (PRENATAL MULTIVITAMIN) TABS tablet Take 1 tablet by mouth daily at 12 noon.     sertraline (ZOLOFT) 50 MG tablet Take 1 tablet (50 mg total) by mouth at bedtime. 30 tablet 1   No current facility-administered medications for this visit.      Psychiatric Specialty Exam: please take into account limitations in obtaining a full MSE in the context of phone communication Review of Systems  Psychiatric/Behavioral:  Positive for sleep disturbance.   All other systems reviewed and are negative.  There were no vitals taken for this visit.There is no height or weight on file to calculate BMI.  General Appearance: NA  Eye Contact:  NA  Speech:  Normal   Volume:  Normal  Mood:  Reports  her mood has generally been " all right"  Affect:  presents reactive , appropriate  Thought Process:  Goal Directed and Linear  Orientation:  Full (Time, Place, and Person)  Thought Content: linear   Suicidal Thoughts:  No- denies suicidal or self injurious ideations   Homicidal Thoughts:  No- denies any violent or homicidal ideations and reports a strong mother/child bond with her newborn  Memory:  Recent and remote grossly intact   Judgement:  Present   Insight:  Present   Psychomotor Activity:  NA  Concentration:  Grossly intact    Recall:  Good  Fund of Knowledge: Good  Language: Good  Akathisia:  Negative  Handed:  Right  AIMS (if indicated): not done  Assets:  Communication Skills Desire for Improvement Financial Resources/Insurance Housing Social Support Talents/Skills  ADL's:  Intact  Cognition: WNL  Sleep:  Fair   Screenings: GAD-7    [place] Office Visit from 04/04/2019 in [facility] Counselor from 12/14/2018 in [facility] Counsel[facility] from 11/24/2018 in [facility]  Total GAD-7 Score 15 17 19       PHQ2-9    [place] Office Visit from 04/04/2019 in  [facility] Counselor from 12/14/2018 in [facility] Counselor from 11/24/2018 in [facility] Patient Outreach from 03/14/2015 in [facility]  PHQ-2 Total Score 4 4 6 1   PHQ-9 Total Score 16 19 24  --      Flowsheet Row Admission (Discharged) from 05/30/2021 in [facility] Admission (Discharged) from 05/24/2021 in [facility]  C-SSRS RISK CATEGORY No Risk No Risk        Assessment and Plan: 40 yo divorced (but soon to be married) female with hx of PTSD, bipolar 2 disorder and GAD/panic disorder.    Dx: Bipolar 2 disorder; Panic disorder/GAD/PTSD; Hx of ADHD dx.  S/P post partum 9/11. Labor was complicated by preeclampsia. Reports her newborn son has right aortic arch but is doing well and thriving . Following with neonatologist .  On Lamotrigine and Zoloft, but states prefers to resume Viibryd, which she states she was taking along with Lamotrigine in the past with good response and tolerance . Side effects have been reviewed, including risk of severe rash on lamotrigine  D/ C Zoloft  Start Viibryd 10 mgr QDAY initially, with plan of titrating gradually as tolerated  Continue Lamotrigine 25 mgr x 1 week , then increase to 25 mgr BID Continue individual psychotherapy Will see in 4-6 weeks- agrees to contact clinic sooner if any worsening prior    Jenne Campus, MD 06/24/2021, 2:38 PMPatient ID: [name], female   DOB: 03/18/1981, 40 y.o.   MRN: [id number]

## 2021-06-25 ENCOUNTER — Telehealth (HOSPITAL_COMMUNITY): Payer: Self-pay | Admitting: *Deleted

## 2021-06-25 NOTE — Telephone Encounter (Signed)
PA submitted and approved for Viibryd 10 mg tablets through 06/25/22. PA Case ID # [id number].

## 2021-06-25 NOTE — Telephone Encounter (Signed)
Pa for Viibryd 10 mg tablet approved by [employer] via CoverMyMeds from 06/25/21 through 06/25/22. PA case # [id number].

## 2021-07-22 ENCOUNTER — Other Ambulatory Visit (HOSPITAL_COMMUNITY): Payer: Self-pay | Admitting: Psychiatry

## 2021-07-22 ENCOUNTER — Other Ambulatory Visit: Payer: Self-pay

## 2021-07-22 ENCOUNTER — Telehealth (HOSPITAL_BASED_OUTPATIENT_CLINIC_OR_DEPARTMENT_OTHER): Payer: Medicaid Other | Admitting: Psychiatry

## 2021-07-22 ENCOUNTER — Encounter (HOSPITAL_COMMUNITY): Payer: Self-pay | Admitting: Psychiatry

## 2021-07-22 DIAGNOSIS — F3181 Bipolar II disorder: Secondary | ICD-10-CM | POA: Diagnosis not present

## 2021-07-22 MED ORDER — LAMOTRIGINE 25 MG PO TABS: 25.0000 mg | ORAL_TABLET | Freq: Three times a day (TID) | ORAL | 1 refills | Status: DC

## 2021-07-22 MED ORDER — VILAZODONE HCL 20 MG PO TABS: 20.0000 mg | ORAL_TABLET | Freq: Every day | ORAL | 1 refills | Status: DC
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
## 2021-08-06 ENCOUNTER — Other Ambulatory Visit (HOSPITAL_COMMUNITY): Payer: Self-pay | Admitting: Psychiatry

## 2021-08-19 ENCOUNTER — Other Ambulatory Visit: Payer: Self-pay

## 2021-08-19 ENCOUNTER — Telehealth (HOSPITAL_BASED_OUTPATIENT_CLINIC_OR_DEPARTMENT_OTHER): Payer: Medicaid Other | Admitting: Psychiatry

## 2021-08-19 ENCOUNTER — Encounter (HOSPITAL_COMMUNITY): Payer: Self-pay | Admitting: Psychiatry

## 2021-08-19 DIAGNOSIS — F3181 Bipolar II disorder: Secondary | ICD-10-CM

## 2021-08-19 MED ORDER — LAMOTRIGINE 25 MG PO TABS: 50.0000 mg | ORAL_TABLET | Freq: Two times a day (BID) | ORAL | 1 refills | Status: DC

## 2021-08-19 MED ORDER — VILAZODONE HCL 20 MG PO TABS: 30.0000 mg | ORAL_TABLET | Freq: Every day | ORAL | 1 refills | Status: DC

## 2021-08-19 NOTE — Progress Notes (Signed)
[place] MD/PA/NP OP Progress Note  08/19/2021 3:38 PM ITZIA CUNLIFFE  MRN:  [id number] Interview was conducted by phone and I verified that I was speaking with the correct person using two identifiers. I discussed the limitations of evaluation and management by telemedicine and  the availability of in person appointments. Patient expressed understanding and agreed to proceed. Participants in the visit: patient (location - home); physician (location - home office). Duration 25 minutes   Chief Complaint:  returns for medication management   HPI: 40 yo divorced female with hx of PTSD, bipolar II disorder and GAD/panic disorder.     Reports that in general she has been doing well and describes generally stable mood although does endorse some subjective mood swings and vague irritability at times . She states she is functioning well in her daily activities and describes she has a strong mother/child bond with her infant son. He has a congenital cardiovascular malformation , which she reports is apparently constricting esophagus . They are following with pediatric cardiologist and she reports that he will require surgery to correct , and it is scheduled for mid December .   Denies significant neuro-vegetative symptoms . Denies suicidal ideations and present future oriented .  Denies psychotic symptoms.   She is tolerating Viibryd and Lamictal well without side effects. ( Of note, reports she is not breastfeeding) . She states these medications have historically been helpful and well tolerated . She reports she feels she was doing better at higher Viibryd ( was on 40 mgr QDAY in the past) and Lamotrigine ( was on 150 mgr QDAY ) in the past .  Side effects reviewed .                  Visit Diagnosis:  No diagnosis found.  Past Psychiatric History: Please see intake H&P.  Past Medical History:  Past Medical History:  Diagnosis Date   Abscess of left axilla - PREVOTELLA BIVIA & Staph  Coag Neg 07/12/2012   MODERATE PREVOTELLA BIVIA Note: BETA LACTAMASE NEGATIVE     Asthma    as a child   Cancer ([facility])    skin tag rt breast   Cellulitis 06/01/2014   RT INNER THIGH   Depression    hx   Diabetes ([place])    Diabetes mellitus    IDDM, Insulin pump; followed by Dr. [name]   DVT (deep vein thrombosis) in pregnancy    GERD (gastroesophageal reflux disease)    no current meds.   Heart murmur    Hidradenitis 04/2012   bilat. thighs, left groin - open areas on thighs   Hx MRSA infection    Hx of blood clots    in left leg   Hydradenitis    PCOS (polycystic ovarian syndrome)    Pneumonia    hx   SVT (supraventricular tachycardia) ([place]) 06/2017   TIA (transient ischemic attack) 2019   Vitamin D insufficiency     Past Surgical History:  Procedure Laterality Date   BREAST EXCISIONAL BIOPSY Right    BREAST SURGERY     right lumpectomy   BUNIONECTOMY     left   CARDIAC CATHETERIZATION     CESAREAN SECTION N/A 06/02/2021   Procedure: CESAREAN SECTION;  Surgeon: Paula Compton, MD;  Location: [facility];  Service: Obstetrics;  Laterality: N/A;   CHOLECYSTECTOMY  12/02/2006   lap. chole.   DILATION AND EVACUATION  02/14/2007   ESOPHAGOGASTRODUODENOSCOPY  11/17/2011   Procedure: ESOPHAGOGASTRODUODENOSCOPY (EGD);  Surgeon: [name], MD;  Location: [facility];  Service: Endoscopy;  Laterality: N/A;   EYE SURGERY     exc. stye left eye   HYDRADENITIS EXCISION  04/30/2012   Procedure: EXCISION HYDRADENITIS GROIN;  Surgeon: [name], MD;  Location: [facility];  Service: General;  Laterality: Left;  wide excision hidradenitis bilateral thighs and Left groin   HYDRADENITIS EXCISION Left 01/13/2014   Procedure: WIDE EXCISION HIDRADENITIS LEFT AXILLA;  Surgeon: [name], MD;  Location: [facility];  Service: General;  Laterality: Left;   INCISION AND DRAINAGE ABSCESS Right 03/17/2019   Procedure: INCISION AND DRAINAGE RIGHT THIGH  ABSCESS;  Surgeon: [name], MD;  Location: [facility];  Service: General;  Laterality: Right;   IRRIGATION AND DEBRIDEMENT ABSCESS Right 06/02/2014   Procedure: IRRIGATION AND DEBRIDEMENT ABSCESS;  Surgeon: [name], MD;  Location: [facility];  Service: General;  Laterality: Right;   IRRIGATION AND DEBRIDEMENT ABSCESS Left 09/23/2014   Procedure: IRRIGATION AND DEBRIDEMENT ABSCESS/LEFT THIGH;  Surgeon: [name], MD;  Location: [facility];  Service: General;  Laterality: Left;   LEFT HEART CATHETERIZATION WITH CORONARY ANGIOGRAM N/A 07/14/2014   Procedure: LEFT HEART CATHETERIZATION WITH CORONARY ANGIOGRAM;  Surgeon: [name], MD;  Location: [facility] CATH LAB;  Service: Cardiovascular;  Laterality: N/A;   TONSILLECTOMY     WISDOM TOOTH EXTRACTION      Family Psychiatric History: Reviewed.  Family History:  Family History  Problem Relation Age of Onset   Cancer Mother    Anxiety disorder Mother    Depression Mother    Sexual abuse Mother    Breast cancer Mother 47   Hyperlipidemia Sister    Hypertension Sister    Sexual abuse Sister    Hypertension Father    Anxiety disorder Father    Depression Father    Drug abuse Father    Stroke Paternal Uncle    ADD / ADHD Paternal Uncle    Anxiety disorder Paternal Uncle    Anxiety disorder Maternal Aunt    Seizures Maternal Aunt    Anxiety disorder Paternal Aunt    Anxiety disorder Maternal Uncle    ADD / ADHD Cousin    ADD / ADHD Daughter     Social History:  Social History   Socioeconomic History   Marital status: Married    Spouse name: Louis Meckel   Number of children: Not on file   Years of education: Not on file   Highest education level: Not on file  Occupational History   Not on file  Tobacco Use   Smoking status: Former    Packs/day: 1.00    Years: 23.00    Pack years: 23.00    Types: Cigarettes    Quit date: 09/22/2020    Years since quitting: 0.9   Smokeless tobacco: Never   Tobacco comments:    Pt  reports today that she quit smoking January 1st of this year. (2022)  Vaping Use   Vaping Use: Never used  Substance and Sexual Activity   Alcohol use: No    Alcohol/week: 0.0 standard drinks   Drug use: Not Currently    Types: Marijuana    Comment: 2020   Sexual activity: Yes    Partners: Male    Birth control/protection: I.U.D.  Other Topics Concern   Not on file  Social History Narrative   Not on file   Social Determinants of Health   Financial Resource Strain: Not on file  Food Insecurity: Not on file  Transportation Needs: Not on file  Physical Activity: Not on file  Stress: Not on file  Social Connections: Not on file    Allergies:  Allergies  Allergen Reactions   Latex Hives, Shortness Of Breath and Rash   Levofloxacin Shortness Of Breath and Rash   Moxifloxacin Shortness Of Breath and Rash   Oxycodone-Acetaminophen Shortness Of Breath, Swelling and Rash    NORCO/VICODIN OK   Peach [Prunus Persica] Hives and Shortness Of Breath   Potassium-Containing Compounds Other (See Comments)    IV ROUTE - CAUSES VEINS TO COLLAPS; Reports that it is undiluted K only   Prednisone Other (See Comments)    SEVERE ELEVATION OF BLOOD SUGAR. Able to tolerate 40 mg   Propoxyphene N-Acetaminophen Swelling    SWELLING OF FACE AND THROAT   Rosiglitazone Maleate Swelling    SWELLING OF FACE AND LEGS   Xolair [Omalizumab] Other (See Comments)    Rash and anaphylaxis   Adhesive [Tape] Hives, Itching and Rash   Morphine And Related Other (See Comments)    Causes hallucinations   Prozac [Fluoxetine Hcl] Other (See Comments)    Made her very aggressive    Chantix [Varenicline]     dreams   Citrullus Vulgaris Nausea And Vomiting    Facial swelling   Humira [Adalimumab]     Does not remember    Septra [Sulfamethoxazole-Trimethoprim]    Zyvox [Linezolid]     Edema    Cefaclor Rash   Keflex [Cephalexin] Diarrhea and Rash    REACTION: severe migraine   Promethazine Hcl Other (See  Comments)    IV ROUTE ONLY - JITTERY FEELING. Patient reports that it is mild and she has used promethazine since then  PO tablet ok   Sulfadiazine Rash    Metabolic Disorder Labs: Lab Results  Component Value Date   HGBA1C 6.9 (H) 05/30/2021   MPG 151.33 05/30/2021   MPG 280.48 12/25/2017   No results found for: PROLACTIN Lab Results  Component Value Date   CHOL 193 03/22/2018   TRIG 90.0 03/22/2018   HDL 62.80 03/22/2018   CHOLHDL 3 03/22/2018   VLDL 18.0 03/22/2018   LDLCALC 113 (H) 03/22/2018   LDLCALC 88 12/19/2015   Lab Results  Component Value Date   TSH 1.43 03/22/2018   TSH 0.773 12/25/2017    Therapeutic Level Labs: No results found for: LITHIUM No results found for: VALPROATE No components found for:  CBMZ  Current Medications: Current Outpatient Medications  Medication Sig Dispense Refill   albuterol (PROVENTIL) (2.5 MG/3ML) 0.083% nebulizer solution Take 3 mLs (2.5 mg total) by nebulization every 6 (six) hours as needed for wheezing or shortness of breath. 75 mL 12   BAQSIMI TWO PACK 3 MG/DOSE POWD Place 3 mg into both nostrils daily as needed. Low CBG     cyclobenzaprine (FLEXERIL) 10 MG tablet Take 1 tablet (10 mg total) by mouth 3 (three) times daily as needed for muscle spasms. 30 tablet 0   enoxaparin (LOVENOX) 100 MG/ML injection Inject 100 mg into the skin every 12 (twelve) hours.     glucose blood ([employer] TEST STRIPS) test strip 1 each by Other route See admin instructions.      HUMALOG 100 UNIT/ML injection 20-22 units pre-meal as per sliding scale (Patient taking differently: 150 Units every 3 (three) days. Using Omnipod pump) 10 mL 11   ibuprofen (ADVIL) 600 MG tablet Take 1 tablet (600 mg total) by mouth every 6 (  six) hours as needed. 90 tablet 0   lamoTRIgine (LAMICTAL) 25 MG tablet Take 1 tablet (25 mg total) by mouth 3 (three) times daily. Take one tablet in the morning and two tablets at night time 90 tablet 1   NIFEdipine (ADALAT CC) 60 MG  24 hr tablet Take 1 tablet (60 mg total) by mouth daily. 30 tablet 1   Prenatal Vit-Fe Fumarate-FA (PRENATAL MULTIVITAMIN) TABS tablet Take 1 tablet by mouth daily at 12 noon.     Vilazodone HCl (VIIBRYD) 20 MG TABS Take 1 tablet (20 mg total) by mouth daily. 30 tablet 1   No current facility-administered medications for this visit.      Psychiatric Specialty Exam: please take into account limitations in obtaining a full MSE in the context of phone communication Review of Systems  Eyes:  Positive for redness.  Psychiatric/Behavioral:  Positive for sleep disturbance.   All other systems reviewed and are negative.  There were no vitals taken for this visit.There is no height or weight on file to calculate BMI.  General Appearance: NA  Eye Contact:  NA  Speech:  Normal   Volume:  Normal  Mood:  Reports  mood as generally improved, stable although with some residual brief mood swings. Appears euthymic   Affect:  reactive , appropriate  Thought Process:  Goal Directed and Linear  Orientation:  Full (Time, Place, and Person)  Thought Content: denies hallucinations, no delusions expressed, future oriented and focused on family issues/concerns. Denies SI and presents future oriented, no HI   Suicidal Thoughts:  No- denies suicidal or self injurious ideations   Homicidal Thoughts:  No- denies any violent or homicidal ideations and reports a strong mother/child bond with her newborn  Memory:  Recent and remote grossly intact   Judgement:  Present   Insight:  Present   Psychomotor Activity:  NA  Concentration:  Grossly intact    Recall:  Good  Fund of Knowledge: Good  Language: Good  Akathisia:  Negative  Handed:  Right  AIMS (if indicated): not done  Assets:  Communication Skills Desire for Improvement Financial Resources/Insurance Housing Social Support Talents/Skills  ADL's:  Intact  Cognition: WNL  Sleep:  Fair   Screenings: GAD-7    [place] Office Visit from  04/04/2019 in [facility] from 12/14/2018 in [facility] from 11/24/2018 in [facility]  Total GAD-7 Score 15 17 19       PHQ2-9    [facility] Office Visit from 04/04/2019 in [facility] from 12/14/2018 in [facility] from 11/24/2018 in [facility] from 03/14/2015 in [facility]  PHQ-2 Total Score 4 4 6 1   PHQ-9 Total Score 16 19 24  --      Flowsheet Row Admission (Discharged) from 05/30/2021 in [facility] Admission (Discharged) from 05/24/2021 in [facility]  C-SSRS RISK CATEGORY No Risk No Risk        Assessment and Plan: 40 yo female with hx of PTSD, bipolar 2 disorder and GAD/panic disorder.   She reports she is generally doing well . Reports she is tolerating medications well  Mood describes as generally stable although still reports some slight mood instability and anxiety. Presents euthymic . Denies SI, presents future oriented . Her infant son has congential cardiovascular malformation and is scheduled for elective surgery  in mid December .  Of note, she reports she is not breastfeeding . Reports Viibryd and Lamictal well tolerated and historically effective, helpful. States she feels she had been doing better at higher doses and is interested in titrating meds.     Dx: Bipolar 2 disorder; Anxiety/ GAD       Increase Viibryd to 30 mgr QDAY  Increase Lamotrigine to 50 mgr BID  She reports she was sent resources for outside individual therapy but would prefer to have therapy at [facility] outpatient clinic as well . Will ask staff to determine therapist availability  Will see in 4 weeks  She agrees to contact clinic sooner if any worsening prior and to go to ED if needed    Jenne Campus, MD 08/19/2021, 3:38 PM   Patient ID: Vernon Prey, female   DOB: 1980/11/09, 40 y.o.   MRN: [id number]

## 2021-09-02 ENCOUNTER — Other Ambulatory Visit: Payer: Self-pay

## 2021-09-02 ENCOUNTER — Ambulatory Visit (HOSPITAL_COMMUNITY): Payer: Medicaid Other | Admitting: Clinical

## 2021-09-04 ENCOUNTER — Telehealth: Payer: Self-pay | Admitting: Internal Medicine

## 2021-09-04 MED ORDER — CLONAZEPAM 0.5 MG PO TABS: ORAL_TABLET | ORAL | 3 refills | Status: DC

## 2021-09-04 NOTE — Telephone Encounter (Signed)
Asking clonazepam for sleep. Much stress with infant son's heart surgery pending. Plan- clonazepam script to [facility]

## 2021-09-30 ENCOUNTER — Other Ambulatory Visit: Payer: Self-pay

## 2021-09-30 ENCOUNTER — Encounter (HOSPITAL_COMMUNITY): Payer: Self-pay | Admitting: Psychiatry

## 2021-09-30 ENCOUNTER — Telehealth (HOSPITAL_BASED_OUTPATIENT_CLINIC_OR_DEPARTMENT_OTHER): Payer: Medicaid Other | Admitting: Psychiatry

## 2021-09-30 DIAGNOSIS — F3181 Bipolar II disorder: Secondary | ICD-10-CM | POA: Diagnosis not present

## 2021-09-30 MED ORDER — LAMOTRIGINE 25 MG PO TABS: 50.0000 mg | ORAL_TABLET | Freq: Two times a day (BID) | ORAL | 1 refills | Status: DC

## 2021-09-30 MED ORDER — VILAZODONE HCL 20 MG PO TABS: 40.0000 mg | ORAL_TABLET | Freq: Every day | ORAL | 1 refills | Status: DC

## 2021-09-30 NOTE — Progress Notes (Signed)
[facility] MD/PA/NP OP Progress Note  09/30/2021 2:46 PM [name]  MRN:  [id number] Interview was conducted by phone and I verified that I was speaking with the correct person using two identifiers. I discussed the limitations of evaluation and management by telemedicine and  the availability of in person appointments. Patient expressed understanding and agreed to proceed. Participants in the visit: patient (location - home); physician (location - home office). Duration 50minutes   Chief Complaint:  returns for medication management   HPI: 41 yo divorced female with hx of PTSD, bipolar II disorder and GAD/panic disorder.     She reports that in general has been doing " all right". States she is functioning well in daily activities .  Her infant son had cardiovascular surgery in December for a vascular ring /malformation which patient reports was impinging on bronchial structures . Thankfully surgery went well and the child is now doing well . She states she has been dealing with other family stressors, however, particularly with regards to her 94 year old daughter who had been exhibiting some " rebellious " behaviors including being truant from school, so that she is now going to some supervised evening classes .  Overall , however, reports that she feels 2023 is going to be " a better year " and presents hopeful and future oriented .  She reports her mood as partially improved . She does state that in the past she responded best to Viibryd at 40 mgr QDAY and felt her mood was improved and stable at this dose, without side effects. She hopes to titrate Viibryd from current 30 mgr dose to 40 mgr dose .   Denies suicidal ideations and present future oriented .  Denies psychotic symptoms.   Of note she reports she is not breastfeeding her child . ( Also reports had a tubal ligation following pregnancy)   We reviewed psych history. Reports mainly history of  recurrent depression, anxiety. Subjective  mood swings have been brief , lasting hours to a day. No history of full manic episodes .                    Visit Diagnosis:  No diagnosis found.  Past Psychiatric History: Please see intake H&P.  Past Medical History:  Past Medical History:  Diagnosis Date   Abscess of left axilla - PREVOTELLA BIVIA & Staph Coag Neg 07/12/2012   MODERATE PREVOTELLA BIVIA Note: BETA LACTAMASE NEGATIVE     Asthma    as a child   Cancer ([place])    skin tag rt breast   Cellulitis 06/01/2014   RT INNER THIGH   Depression    hx   Diabetes ([place])    Diabetes mellitus    IDDM, Insulin pump; followed by Dr. [name]   DVT (deep vein thrombosis) in pregnancy    GERD (gastroesophageal reflux disease)    no current meds.   Heart murmur    Hidradenitis 04/2012   bilat. thighs, left groin - open areas on thighs   Hx MRSA infection    Hx of blood clots    in left leg   Hydradenitis    PCOS (polycystic ovarian syndrome)    Pneumonia    hx   SVT (supraventricular tachycardia) ([place]) 06/2017   TIA (transient ischemic attack) 2019   Vitamin D insufficiency     Past Surgical History:  Procedure Laterality Date   BREAST EXCISIONAL BIOPSY Right    BREAST SURGERY  right lumpectomy   BUNIONECTOMY     left   CARDIAC CATHETERIZATION     CESAREAN SECTION N/A 06/02/2021   Procedure: CESAREAN SECTION;  Surgeon: Paula Compton, MD;  Location: [facility];  Service: Obstetrics;  Laterality: N/A;   CHOLECYSTECTOMY  12/02/2006   lap. chole.   DILATION AND EVACUATION  02/14/2007   ESOPHAGOGASTRODUODENOSCOPY  11/17/2011   Procedure: ESOPHAGOGASTRODUODENOSCOPY (EGD);  Surgeon: [name], MD;  Location: [facility];  Service: Endoscopy;  Laterality: N/A;   EYE SURGERY     exc. stye left eye   HYDRADENITIS EXCISION  04/30/2012   Procedure: EXCISION HYDRADENITIS GROIN;  Surgeon: [name], MD;  Location: [facility];  Service: General;  Laterality: Left;  wide  excision hidradenitis bilateral thighs and Left groin   HYDRADENITIS EXCISION Left 01/13/2014   Procedure: WIDE EXCISION HIDRADENITIS LEFT AXILLA;  Surgeon: [name], MD;  Location: [facility];  Service: General;  Laterality: Left;   INCISION AND DRAINAGE ABSCESS Right 03/17/2019   Procedure: INCISION AND DRAINAGE RIGHT THIGH ABSCESS;  Surgeon: [name], MD;  Location: [facility];  Service: General;  Laterality: Right;   IRRIGATION AND DEBRIDEMENT ABSCESS Right 06/02/2014   Procedure: IRRIGATION AND DEBRIDEMENT ABSCESS;  Surgeon: [name], MD;  Location: [facility];  Service: General;  Laterality: Right;   IRRIGATION AND DEBRIDEMENT ABSCESS Left 09/23/2014   Procedure: IRRIGATION AND DEBRIDEMENT ABSCESS/LEFT THIGH;  Surgeon: [name], MD;  Location: [facility];  Service: General;  Laterality: Left;   LEFT HEART CATHETERIZATION WITH CORONARY ANGIOGRAM N/A 07/14/2014   Procedure: LEFT HEART CATHETERIZATION WITH CORONARY ANGIOGRAM;  Surgeon: [name], MD;  Location: [facility];  Service: Cardiovascular;  Laterality: N/A;   TONSILLECTOMY     WISDOM TOOTH EXTRACTION      Family Psychiatric History: Reviewed.  Family History:  Family History  Problem Relation Age of Onset   Cancer Mother    Anxiety disorder Mother    Depression Mother    Sexual abuse Mother    Breast cancer Mother 87   Hyperlipidemia Sister    Hypertension Sister    Sexual abuse Sister    Hypertension Father    Anxiety disorder Father    Depression Father    Drug abuse Father    Stroke Paternal Uncle    ADD / ADHD Paternal Uncle    Anxiety disorder Paternal Uncle    Anxiety disorder Maternal Aunt    Seizures Maternal Aunt    Anxiety disorder Paternal Aunt    Anxiety disorder Maternal Uncle    ADD / ADHD Cousin    ADD / ADHD Daughter     Social History:  Social History   Socioeconomic History   Marital status: Married    Spouse name: Louis Meckel   Number of children: Not on file   Years of  education: Not on file   Highest education level: Not on file  Occupational History   Not on file  Tobacco Use   Smoking status: Former    Packs/day: 1.00    Years: 23.00    Pack years: 23.00    Types: Cigarettes    Quit date: 09/22/2020    Years since quitting: 1.0   Smokeless tobacco: Never   Tobacco comments:    Pt reports today that she quit smoking January 1st of this year. (2022)  Vaping Use   Vaping Use: Never used  Substance and Sexual Activity   Alcohol use: No  Alcohol/week: 0.0 standard drinks   Drug use: Not Currently    Types: Marijuana    Comment: 2020   Sexual activity: Yes    Partners: Male    Birth control/protection: I.U.D.  Other Topics Concern   Not on file  Social History Narrative   Not on file   Social Determinants of Health   Financial Resource Strain: Not on file  Food Insecurity: Not on file  Transportation Needs: Not on file  Physical Activity: Not on file  Stress: Not on file  Social Connections: Not on file    Allergies:  Allergies  Allergen Reactions   Latex Hives, Shortness Of Breath and Rash   Levofloxacin Shortness Of Breath and Rash   Moxifloxacin Shortness Of Breath and Rash   Oxycodone-Acetaminophen Shortness Of Breath, Swelling and Rash    NORCO/VICODIN OK   Peach [Prunus Persica] Hives and Shortness Of Breath   Potassium-Containing Compounds Other (See Comments)    IV ROUTE - CAUSES VEINS TO COLLAPS; Reports that it is undiluted K only   Prednisone Other (See Comments)    SEVERE ELEVATION OF BLOOD SUGAR. Able to tolerate 40 mg   Propoxyphene N-Acetaminophen Swelling    SWELLING OF FACE AND THROAT   Rosiglitazone Maleate Swelling    SWELLING OF FACE AND LEGS   Xolair [Omalizumab] Other (See Comments)    Rash and anaphylaxis   Adhesive [Tape] Hives, Itching and Rash   Morphine And Related Other (See Comments)    Causes hallucinations   Prozac [Fluoxetine Hcl] Other (See Comments)    Made her very aggressive     Chantix [Varenicline]     dreams   Citrullus Vulgaris Nausea And Vomiting    Facial swelling   Humira [Adalimumab]     Does not remember    Septra [Sulfamethoxazole-Trimethoprim]    Zyvox [Linezolid]     Edema    Cefaclor Rash   Keflex [Cephalexin] Diarrhea and Rash    REACTION: severe migraine   Promethazine Hcl Other (See Comments)    IV ROUTE ONLY - JITTERY FEELING. Patient reports that it is mild and she has used promethazine since then  PO tablet ok   Sulfadiazine Rash    Metabolic Disorder Labs: Lab Results  Component Value Date   HGBA1C 6.9 (H) 05/30/2021   MPG 151.33 05/30/2021   MPG 280.48 12/25/2017   No results found for: PROLACTIN Lab Results  Component Value Date   CHOL 193 03/22/2018   TRIG 90.0 03/22/2018   HDL 62.80 03/22/2018   CHOLHDL 3 03/22/2018   VLDL 18.0 03/22/2018   LDLCALC 113 (H) 03/22/2018   LDLCALC 88 12/19/2015   Lab Results  Component Value Date   TSH 1.43 03/22/2018   TSH 0.773 12/25/2017    Therapeutic Level Labs: No results found for: LITHIUM No results found for: VALPROATE No components found for:  CBMZ  Current Medications: Current Outpatient Medications  Medication Sig Dispense Refill   albuterol (PROVENTIL) (2.5 MG/3ML) 0.083% nebulizer solution Take 3 mLs (2.5 mg total) by nebulization every 6 (six) hours as needed for wheezing or shortness of breath. 75 mL 12   BAQSIMI TWO PACK 3 MG/DOSE POWD Place 3 mg into both nostrils daily as needed. Low CBG     clonazePAM (KLONOPIN) 0.5 MG tablet 1 or 2 for sleep as needed 30 tablet 3   glucose blood ([employer] TEST STRIPS) test strip 1 each by Other route See admin instructions.      HUMALOG 100 UNIT/ML  injection 20-22 units pre-meal as per sliding scale (Patient taking differently: 150 Units every 3 (three) days. Using Omnipod pump) 10 mL 11   ibuprofen (ADVIL) 600 MG tablet Take 1 tablet (600 mg total) by mouth every 6 (six) hours as needed. 90 tablet 0   lamoTRIgine (LAMICTAL) 25  MG tablet Take 2 tablets (50 mg total) by mouth 2 (two) times daily. 120 tablet 1   Prenatal Vit-Fe Fumarate-FA (PRENATAL MULTIVITAMIN) TABS tablet Take 1 tablet by mouth daily at 12 noon.     Vilazodone HCl (VIIBRYD) 20 MG TABS Take 1.5 tablets (30 mg total) by mouth daily. 30 tablet 1   No current facility-administered medications for this visit.      Psychiatric Specialty Exam: please take into account limitations in obtaining a full MSE in the context of phone communication Review of Systems  Eyes:  Positive for redness.  Psychiatric/Behavioral:  Positive for sleep disturbance.   All other systems reviewed and are negative.  There were no vitals taken for this visit.There is no height or weight on file to calculate BMI.  General Appearance: NA  Eye Contact:  NA  Speech:  Normal   Volume:  Normal  Mood:  Reports mood as partially but noticeably improved and states she has been feeling " all right".   Affect:  reactive , appropriate  Thought Process:  Goal Directed and Linear  Orientation:  Full (Time, Place, and Person)  Thought Content: denies hallucinations, no delusions expressed, presents future oriented . No SI.   Suicidal Thoughts:  No- denies suicidal or self injurious ideations   Homicidal Thoughts:  No- no HI or violent ideations   Memory:  Recent and remote grossly intact   Judgement:  Present   Insight:  Present   Psychomotor Activity:  NA  Concentration:  Grossly intact    Recall:  Good  Fund of Knowledge: Good  Language: Good  Akathisia:  Negative  Handed:  Right  AIMS (if indicated): not done  Assets:  Communication Skills Desire for Improvement Financial Resources/Insurance Housing Social Support Talents/Skills  ADL's:  Intact  Cognition: WNL  Sleep:  Fair   Screenings: GAD-7    [place] Office Visit from 04/04/2019 in [facility] Counselor from 12/14/2018 in [facility]  Counselor from 11/24/2018 in [facility]  Total GAD-7 Score 15 17 19       PHQ2-9    [place] Office Visit from 04/04/2019 in [facility] Counselor from 12/14/2018 in [facility] Counselor from 11/24/2018 in [facility] Outreach from 03/14/2015 in [facility]  PHQ-2 Total Score 4 4 6 1   PHQ-9 Total Score 16 19 24  --      Flowsheet Row Admission (Discharged) from 05/30/2021 in [facility] Admission (Discharged) from 05/24/2021 in [facility]  C-SSRS RISK CATEGORY No Risk No Risk        Assessment and Plan: 40 yo female with hx of PTSD, bipolar 2 disorder and GAD/panic disorder.    She reports that in general has been doing " all right"/ is functioning well in daily activities . Describes improving mood although some lingering anxiety. She feels optimistic and hopeful 2023 will be a good year . No SI.  Her infant son had cardiovascular surgery in December for a vascular malformation.  Surgery went well and   child is now  doing well . States she has been having difficulties with her 72 year old daughter, who has been truant from school resulting in social work involvement   She reports Viibryd and Lamotrigine have been effective and well tolerated . She states she feels this medication combination has been more effective than others she has been in the past .She does state that she feels she had responded best to Viibryd at 40 mgr QDAY and felt her mood was stable at this dose, without side effects, and would like to titrate Viibryd from current 30 mgr dose to 40 mgr dose .  Side effects reviewed .   *Of note she reports she is not breastfeeding her child .     Dx: Bipolar 2 disorder; Anxiety/ GAD       Increase Viibryd to 40 mgr QDAY for depression, anxiety  Continue Lamotrigine  50 mgr BID  for  mood disorder  Will see in about 4 weeks  She agrees to contact clinic sooner if any worsening prior and to go to ED if needed    Jenne Campus, MD 09/30/2021, 2:46 PM   Patient ID: Vernon Prey, female   DOB: 20-Dec-1980, 41 y.o.   MRN: [id number]

## 2021-10-07 ENCOUNTER — Ambulatory Visit: Payer: Medicaid Other | Admitting: Orthopaedic Surgery

## 2021-10-07 ENCOUNTER — Other Ambulatory Visit: Payer: Self-pay

## 2021-10-07 ENCOUNTER — Ambulatory Visit (INDEPENDENT_AMBULATORY_CARE_PROVIDER_SITE_OTHER): Payer: Medicaid Other | Admitting: Clinical

## 2021-10-07 DIAGNOSIS — F3181 Bipolar II disorder: Secondary | ICD-10-CM | POA: Diagnosis not present

## 2021-10-07 DIAGNOSIS — F419 Anxiety disorder, unspecified: Secondary | ICD-10-CM | POA: Diagnosis not present

## 2021-10-07 DIAGNOSIS — Z87828 Personal history of other (healed) physical injury and trauma: Secondary | ICD-10-CM | POA: Diagnosis not present

## 2021-10-07 NOTE — Plan of Care (Signed)
Pt will develop and implement healthy coping methods to manage stress that causes increase in depression and anxiety sxs as evidenced by engaging in physical activity 3 out of 7 days per week up to 30 minutes. Pt participated in completion of treatment plan.
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
## 2021-10-28 ENCOUNTER — Other Ambulatory Visit: Payer: Self-pay

## 2021-10-28 ENCOUNTER — Encounter (HOSPITAL_COMMUNITY): Payer: Self-pay | Admitting: Psychiatry

## 2021-10-28 ENCOUNTER — Telehealth (HOSPITAL_BASED_OUTPATIENT_CLINIC_OR_DEPARTMENT_OTHER): Payer: Medicaid Other | Admitting: Psychiatry

## 2021-10-28 DIAGNOSIS — F3181 Bipolar II disorder: Secondary | ICD-10-CM | POA: Diagnosis not present

## 2021-10-28 MED ORDER — VILAZODONE HCL 20 MG PO TABS: 40.0000 mg | ORAL_TABLET | Freq: Every day | ORAL | 1 refills | Status: DC

## 2021-10-28 MED ORDER — LAMOTRIGINE 100 MG PO TABS: 150.0000 mg | ORAL_TABLET | Freq: Every day | ORAL | 1 refills | Status: DC

## 2021-10-28 NOTE — Progress Notes (Signed)
[place] MD/PA/NP OP Progress Note  10/28/2021 3:02 PM [name]  MRN:  [id number] Interview was conducted by phone and I verified that I was speaking with the correct person using two identifiers. I discussed the limitations of evaluation and management by telemedicine and  the availability of in person appointments. Patient expressed understanding and agreed to proceed. Participants in the visit: patient (location - home); physician (location - home office). Duration 72minutes   Chief Complaint:  returns for medication management   HPI: 41 yo divorced female with hx of PTSD, bipolar II disorder and GAD/panic disorder.       [name] reports she is doing " all right ". Functioning well in daily activities . States her baby is doing well and has recuperated well from surgery, meeting developmental milestones . She confirms she is not currently breastfeeding. She denies medication side effects, and reports that the combination of Viibryd and Lamotrigine has been effective for her and has felt her mood improved and been more stable on these medications . She does want to continue titrating Lamotrigine dose up gradually. She is currently on 50 mgr BID. States in the past has taken up to 100 mgr BID without side effects. Currently reports mood is stable and presents euthymic, with reactive /appropriate affect . No SI  ( As per med review, she is prescribed Klonopin 0.5 - 1 mgr QHS PRN for insomnia by another MD. She states she takes this medication infrequently )       Visit Diagnosis:  No diagnosis found.  Past Psychiatric History: Please see intake H&P.  Past Medical History:  Past Medical History:  Diagnosis Date   Abscess of left axilla - PREVOTELLA BIVIA & Staph Coag Neg 07/12/2012   MODERATE PREVOTELLA BIVIA Note: BETA LACTAMASE NEGATIVE     Asthma    as a child   Cancer ([place])    skin tag rt breast   Cellulitis 06/01/2014   RT INNER THIGH   Depression    hx   Diabetes ([place])     Diabetes mellitus    IDDM, Insulin pump; followed by Dr. [name]   DVT (deep vein thrombosis) in pregnancy    GERD (gastroesophageal reflux disease)    no current meds.   Heart murmur    Hidradenitis 04/2012   bilat. thighs, left groin - open areas on thighs   Hx MRSA infection    Hx of blood clots    in left leg   Hydradenitis    PCOS (polycystic ovarian syndrome)    Pneumonia    hx   SVT (supraventricular tachycardia) ([place]) 06/2017   TIA (transient ischemic attack) 2019   Vitamin D insufficiency     Past Surgical History:  Procedure Laterality Date   BREAST EXCISIONAL BIOPSY Right    BREAST SURGERY     right lumpectomy   BUNIONECTOMY     left   CARDIAC CATHETERIZATION     CESAREAN SECTION N/A 06/02/2021   Procedure: CESAREAN SECTION;  Surgeon: Paula Compton, MD;  Location: [facility] LD ORS;  Service: Obstetrics;  Laterality: N/A;   CHOLECYSTECTOMY  12/02/2006   lap. chole.   DILATION AND EVACUATION  02/14/2007   ESOPHAGOGASTRODUODENOSCOPY  11/17/2011   Procedure: ESOPHAGOGASTRODUODENOSCOPY (EGD);  Surgeon: [name], MD;  Location: [facility];  Service: Endoscopy;  Laterality: N/A;   EYE SURGERY     exc. stye left eye   HYDRADENITIS EXCISION  04/30/2012   Procedure: EXCISION HYDRADENITIS GROIN;  Surgeon:  [name], MD;  Location: [facility];  Service: General;  Laterality: Left;  wide excision hidradenitis bilateral thighs and Left groin   HYDRADENITIS EXCISION Left 01/13/2014   Procedure: WIDE EXCISION HIDRADENITIS LEFT AXILLA;  Surgeon: [name], MD;  Location: [facility];  Service: General;  Laterality: Left;   INCISION AND DRAINAGE ABSCESS Right 03/17/2019   Procedure: INCISION AND DRAINAGE RIGHT THIGH ABSCESS;  Surgeon: [name], MD;  Location: [facility];  Service: General;  Laterality: Right;   IRRIGATION AND DEBRIDEMENT ABSCESS Right 06/02/2014   Procedure: IRRIGATION AND DEBRIDEMENT ABSCESS;  Surgeon: [name], MD;   Location: [facility];  Service: General;  Laterality: Right;   IRRIGATION AND DEBRIDEMENT ABSCESS Left 09/23/2014   Procedure: IRRIGATION AND DEBRIDEMENT ABSCESS/LEFT THIGH;  Surgeon: [name], MD;  Location: [facility];  Service: General;  Laterality: Left;   LEFT HEART CATHETERIZATION WITH CORONARY ANGIOGRAM N/A 07/14/2014   Procedure: LEFT HEART CATHETERIZATION WITH CORONARY ANGIOGRAM;  Surgeon: [name], MD;  Location: [facility] CATH LAB;  Service: Cardiovascular;  Laterality: N/A;   TONSILLECTOMY     WISDOM TOOTH EXTRACTION      Family Psychiatric History: Reviewed.  Family History:  Family History  Problem Relation Age of Onset   Cancer Mother    Anxiety disorder Mother    Depression Mother    Sexual abuse Mother    Breast cancer Mother 31   Hyperlipidemia Sister    Hypertension Sister    Sexual abuse Sister    Hypertension Father    Anxiety disorder Father    Depression Father    Drug abuse Father    Stroke Paternal Uncle    ADD / ADHD Paternal Uncle    Anxiety disorder Paternal Uncle    Anxiety disorder Maternal Aunt    Seizures Maternal Aunt    Anxiety disorder Paternal Aunt    Anxiety disorder Maternal Uncle    ADD / ADHD Cousin    ADD / ADHD Daughter     Social History:  Social History   Socioeconomic History   Marital status: Married    Spouse name: Louis Meckel   Number of children: Not on file   Years of education: Not on file   Highest education level: Not on file  Occupational History   Not on file  Tobacco Use   Smoking status: Former    Packs/day: 1.00    Years: 23.00    Pack years: 23.00    Types: Cigarettes    Quit date: 09/22/2020    Years since quitting: 1.0   Smokeless tobacco: Never   Tobacco comments:    Pt reports today that she quit smoking January 1st of this year. (2022)  Vaping Use   Vaping Use: Never used  Substance and Sexual Activity   Alcohol use: No    Alcohol/week: 0.0 standard drinks   Drug use: Not Currently    Types:  Marijuana    Comment: 2020   Sexual activity: Yes    Partners: Male    Birth control/protection: I.U.D.  Other Topics Concern   Not on file  Social History Narrative   Not on file   Social Determinants of Health   Financial Resource Strain: Not on file  Food Insecurity: Not on file  Transportation Needs: Not on file  Physical Activity: Not on file  Stress: Not on file  Social Connections: Not on file    Allergies:  Allergies  Allergen Reactions   Latex Hives,  Shortness Of Breath and Rash   Levofloxacin Shortness Of Breath and Rash   Moxifloxacin Shortness Of Breath and Rash   Oxycodone-Acetaminophen Shortness Of Breath, Swelling and Rash    NORCO/VICODIN OK   Peach [Prunus Persica] Hives and Shortness Of Breath   Potassium-Containing Compounds Other (See Comments)    IV ROUTE - CAUSES VEINS TO COLLAPS; Reports that it is undiluted K only   Prednisone Other (See Comments)    SEVERE ELEVATION OF BLOOD SUGAR. Able to tolerate 40 mg   Propoxyphene N-Acetaminophen Swelling    SWELLING OF FACE AND THROAT   Rosiglitazone Maleate Swelling    SWELLING OF FACE AND LEGS   Xolair [Omalizumab] Other (See Comments)    Rash and anaphylaxis   Adhesive [Tape] Hives, Itching and Rash   Morphine And Related Other (See Comments)    Causes hallucinations   Prozac [Fluoxetine Hcl] Other (See Comments)    Made her very aggressive    Chantix [Varenicline]     dreams   Citrullus Vulgaris Nausea And Vomiting    Facial swelling   Humira [Adalimumab]     Does not remember    Septra [Sulfamethoxazole-Trimethoprim]    Zyvox [Linezolid]     Edema    Cefaclor Rash   Keflex [Cephalexin] Diarrhea and Rash    REACTION: severe migraine   Promethazine Hcl Other (See Comments)    IV ROUTE ONLY - JITTERY FEELING. Patient reports that it is mild and she has used promethazine since then  PO tablet ok   Sulfadiazine Rash    Metabolic Disorder Labs: Lab Results  Component Value Date   HGBA1C  6.9 (H) 05/30/2021   MPG 151.33 05/30/2021   MPG 280.48 12/25/2017   No results found for: PROLACTIN Lab Results  Component Value Date   CHOL 193 03/22/2018   TRIG 90.0 03/22/2018   HDL 62.80 03/22/2018   CHOLHDL 3 03/22/2018   VLDL 18.0 03/22/2018   LDLCALC 113 (H) 03/22/2018   LDLCALC 88 12/19/2015   Lab Results  Component Value Date   TSH 1.43 03/22/2018   TSH 0.773 12/25/2017    Therapeutic Level Labs: No results found for: LITHIUM No results found for: VALPROATE No components found for:  CBMZ  Current Medications: Current Outpatient Medications  Medication Sig Dispense Refill   albuterol (PROVENTIL) (2.5 MG/3ML) 0.083% nebulizer solution Take 3 mLs (2.5 mg total) by nebulization every 6 (six) hours as needed for wheezing or shortness of breath. 75 mL 12   BAQSIMI TWO PACK 3 MG/DOSE POWD Place 3 mg into both nostrils daily as needed. Low CBG     clonazePAM (KLONOPIN) 0.5 MG tablet 1 or 2 for sleep as needed 30 tablet 3   glucose blood ([employer] TEST STRIPS) test strip 1 each by Other route See admin instructions.      HUMALOG 100 UNIT/ML injection 20-22 units pre-meal as per sliding scale (Patient taking differently: 150 Units every 3 (three) days. Using Omnipod pump) 10 mL 11   ibuprofen (ADVIL) 600 MG tablet Take 1 tablet (600 mg total) by mouth every 6 (six) hours as needed. 90 tablet 0   lamoTRIgine (LAMICTAL) 25 MG tablet Take 2 tablets (50 mg total) by mouth 2 (two) times daily. 120 tablet 1   Prenatal Vit-Fe Fumarate-FA (PRENATAL MULTIVITAMIN) TABS tablet Take 1 tablet by mouth daily at 12 noon.     Vilazodone HCl (VIIBRYD) 20 MG TABS Take 2 tablets (40 mg total) by mouth daily. 60 tablet 1  No current facility-administered medications for this visit.      Psychiatric Specialty Exam: please take into account limitations in obtaining a full MSE in the context of phone communication Review of Systems  Eyes:  Positive for redness.  Psychiatric/Behavioral:   Positive for sleep disturbance.   All other systems reviewed and are negative.  unknown if currently breastfeeding.There is no height or weight on file to calculate BMI.  General Appearance: NA  Eye Contact:  NA  Speech:  Normal   Volume:  Normal  Mood:  mood reported as improved, and currently presents euthymic  Affect:  reactive , appropriate  Thought Process:  Goal Directed and Linear  Orientation:  Full (Time, Place, and Person)  Thought Content: denies hallucinations, no delusions expressed, presents future oriented . No SI.   Suicidal Thoughts:  No- no suicidal or self injurious ideations   Homicidal Thoughts:  No- no HI   Memory:  Recent and remote grossly intact   Judgement:  Present   Insight:  Present   Psychomotor Activity:  NA  Concentration:  Grossly intact    Recall:  Good  Fund of Knowledge: Good  Language: Good  Akathisia:  Negative  Handed:  Right  AIMS (if indicated): not done  Assets:  Communication Skills Desire for Improvement Financial Resources/Insurance Housing Social Support Talents/Skills  ADL's:  Intact  Cognition: WNL  Sleep:  Fair   Screenings: GAD-7    [place] Office Visit from 04/04/2019 in [facility] Counselor from 12/14/2018 in [facility] Counselor from 11/24/2018 in [facility]  Total GAD-7 Score 15 17 19       PHQ2-9    Flowsheet Row Counselor from 10/07/2021 in [facility] Office Visit from 04/04/2019 in [facility] Counselor from 12/14/2018 in [facility] Counselor from 11/24/2018 in [facility] Patient Outreach from 03/14/2015 in [facility]  PHQ-2 Total Score 5 4 4 6 1   PHQ-9 Total Score 20 16 19 24  --      Flowsheet Row Counselor from 10/07/2021 in [facility] Admission (Discharged) from 05/30/2021 in [facility] Admission (Discharged) from 05/24/2021 in [facility]  C-SSRS RISK CATEGORY No Risk No Risk No Risk        Assessment and Plan: 41 yo female with hx of PTSD, bipolar 2 disorder and GAD/panic disorder.    Patient describes generally stable mood and presents euthymic . She denies medication side effects and identifies current regimen  ( Viibryd/Lamictal ) as effective and well tolerated .  Functioning well in daily activities . States her baby is doing well and has recuperated well from surgery, meeting developmental milestones . She confirms she is not currently breastfeeding. She is interested in continuing to titrate lamotrigine dose gradually.  Side effects , including risk of severe rash on lamotrigine , reviewed      Dx: Bipolar 2 disorder; Anxiety/ GAD       Continue Viibryd to 40 mgr QDAY for depression, anxiety  Increase Lamotrigine  to 50 mgr QAM and 100 mgr QHS ,  for mood disorder  Will see in about 4 weeks  She agrees to contact clinic sooner if any worsening prior and to go to ED if needed    Jenne Campus, MD 10/28/2021, 3:02 PM   Patient ID: [name], female  DOB: 03-22-1981, 41 y.o.   MRN: [id number]

## 2021-10-29 ENCOUNTER — Telehealth (INDEPENDENT_AMBULATORY_CARE_PROVIDER_SITE_OTHER): Payer: Medicaid Other | Admitting: Clinical

## 2021-10-29 ENCOUNTER — Other Ambulatory Visit: Payer: Self-pay

## 2021-10-29 DIAGNOSIS — F3181 Bipolar II disorder: Secondary | ICD-10-CM

## 2021-10-29 NOTE — Progress Notes (Signed)
° °  THERAPIST PROGRESS NOTE  Session Time: 4pm  Participation Level: Active  Behavioral Response: CasualAlertAngry  Type of Therapy: Individual Therapy  Treatment Goals addressed: Anger  Interventions: Supportive Virtual Visit via Video Note  I connected with [name] on 10/29/21 at  4:00 PM EST by a video enabled telemedicine application and verified that I am speaking with the correct person using two identifiers.  Location: Patient: home Provider: office   I discussed the limitations of evaluation and management by telemedicine and the availability of in person appointments. The patient expressed understanding and agreed to proceed.   I discussed the assessment and treatment plan with the patient. The patient was provided an opportunity to ask questions and all were answered. The patient agreed with the plan and demonstrated an understanding of the instructions.   The patient was advised to call back or seek an in-person evaluation if the symptoms worsen or if the condition fails to improve as anticipated.  I provided 20 minutes of non-face-to-face time during this encounter.  Summary: Pt lost connection during session. CSW called pt and lvm to resume session but no response. Session ended abruptly. Pt describes mood as "exhausted, angy" Pt says she is dealing with family stressors and working to maintain boundaries in her relationships. Pt says personal boundaries is what she was working on with last clinician. Pt says her and family will be participating in a "strengthening the family" class this evening.  Suicidal/Homicidal: No SI/HI or AH/VH reported.  Therapist Response: Assessed for change in mood, behavior and daily functioning. Discussed maintaining and establishing personal boundaries. Briefly discussed visual imagery, prompting pt to identify her happy place(drinking cup of coffee, reading a book near a creek) and feelings associated with location.  Diagnosis: Axis  I: Bipolar II disorder    Axis II: No diagnosis    Yvette Rack, LCSW 10/29/2021

## 2021-11-05 ENCOUNTER — Other Ambulatory Visit (HOSPITAL_COMMUNITY): Payer: Self-pay | Admitting: Psychiatry

## 2021-12-10 ENCOUNTER — Encounter (HOSPITAL_COMMUNITY): Payer: Self-pay | Admitting: Psychiatry

## 2021-12-10 ENCOUNTER — Telehealth (HOSPITAL_COMMUNITY): Payer: Medicaid Other | Admitting: Psychiatry

## 2021-12-10 ENCOUNTER — Other Ambulatory Visit: Payer: Self-pay

## 2021-12-10 ENCOUNTER — Telehealth (HOSPITAL_BASED_OUTPATIENT_CLINIC_OR_DEPARTMENT_OTHER): Payer: Medicaid Other | Admitting: Psychiatry

## 2021-12-10 DIAGNOSIS — F3181 Bipolar II disorder: Secondary | ICD-10-CM

## 2021-12-10 MED ORDER — VILAZODONE HCL 20 MG PO TABS: 40.0000 mg | ORAL_TABLET | Freq: Every day | ORAL | 1 refills | Status: DC

## 2021-12-10 MED ORDER — LAMOTRIGINE 100 MG PO TABS: 100.0000 mg | ORAL_TABLET | Freq: Two times a day (BID) | ORAL | 1 refills | Status: DC

## 2021-12-10 NOTE — Progress Notes (Signed)
Patient ID: [name], female   DOB: 02/04/1981, 41 y.o.   MRN: [id number] ? ?

## 2021-12-10 NOTE — Progress Notes (Addendum)
[facility] MD/PA/NP OP Progress Note ? ?12/10/2021 4:26 PM ?[name]  ?MRN:  [id number] ?Interview was conducted by phone and I verified that I was speaking with the correct person using two identifiers. I discussed the limitations of evaluation and management by telemedicine and  the availability of in person appointments. Patient expressed understanding and agreed to proceed. Participants in the visit: patient (location - home); physician (location - home office). ?Duration 34mnutes  ? ?Chief Complaint:  returns for medication management  ? ?HPI: 41yo divorced female with hx of PTSD, bipolar II disorder and GAD/panic disorder.   ? ? ?Patient reports she has been experiencing some increased anxiety.  ?Describes ongoing psychosocial stressors, including family/financial.  ?She reports her daughter has continued to have difficulty going to school ( has history of truancy) and that " every morning " it is a struggle to get daughter to school . She does explain daughter has apparently been bullied at school which may be contributing to the above - states she has spoken with teachers and school administration and that they are aware of the situation.  ?She feels she has limited family support .  ?Her youngest child was born with a cardiovascular malformation which required surgery . States that he is fortunately doing very well and thriving . ? ?She endorses some persistent neuro-vegetative symptoms such as a subjective sense of anhedonia and decreased energy level .  ?Denies suicidal ideations .  ? ?Denies medication side effects and has reported that combination of Lamictal and Viibryd has historically been effective and well tolerated . She reports had been doing well on this combination in the past, had stopped it due to pregnancy ( of note, reports had tubal ligation following birth of her son) . She had been on higher dose of Lamotrigine then , without side effects ( currently on 150 mgr daily dose )  ? ?Confirms  she is not currently breast feeding . ?  ?Of note, reports she recently had brief medical admission for DKA in the context of not having long acting insulin available for a few days prior. States now back on regular insulin regimen and being transitioned to insulin pump, which has worked best for her before . ? ?  ?Visit Diagnosis:  ?No diagnosis found. ? ?Past Psychiatric History: Please see intake H&P. ? ?Past Medical History:  ?Past Medical History:  ?Diagnosis Date  ? Abscess of left axilla - PREVOTELLA BIVIA & Staph Coag Neg 07/12/2012  ? MODERATE PREVOTELLA BIVIA Note: BETA LACTAMASE NEGATIVE    ? Asthma   ? as a child  ? [facility] ([facility]   ? skin tag rt breast  ? Cellulitis 06/01/2014  ? RT INNER THIGH  ? Depression   ? hx  ? Diabetes ([place]   ? Diabetes mellitus   ? IDDM, Insulin pump; followed by Dr. JDelrae Rend ? DVT (deep vein thrombosis) in pregnancy   ? GERD (gastroesophageal reflux disease)   ? no current meds.  ? Heart murmur   ? Hidradenitis 04/2012  ? bilat. thighs, left groin - open areas on thighs  ? Hx MRSA infection   ? Hx of blood clots   ? in left leg  ? Hydradenitis   ? PCOS (polycystic ovarian syndrome)   ? Pneumonia   ? hx  ? SVT (supraventricular tachycardia) ([place] 06/2017  ? TIA (transient ischemic attack) 2019  ? Vitamin D insufficiency   ?  ?Past Surgical History:  ?Procedure Laterality Date  ?  BREAST EXCISIONAL BIOPSY Right   ? BREAST SURGERY    ? right lumpectomy  ? BUNIONECTOMY    ? left  ? CARDIAC CATHETERIZATION    ? CESAREAN SECTION N/A 06/02/2021  ? Procedure: CESAREAN SECTION;  Surgeon: Paula Compton, MD;  Location: [facility] LD ORS;  Service: Obstetrics;  Laterality: N/A;  ? CHOLECYSTECTOMY  12/02/2006  ? lap. chole.  ? DILATION AND EVACUATION  02/14/2007  ? ESOPHAGOGASTRODUODENOSCOPY  11/17/2011  ? Procedure: ESOPHAGOGASTRODUODENOSCOPY (EGD);  Surgeon: [name], MD;  Location: [facility];  Service: Endoscopy;  Laterality: N/A;  ? EYE SURGERY    ? exc. stye left eye  ?  HYDRADENITIS EXCISION  04/30/2012  ? Procedure: EXCISION HYDRADENITIS GROIN;  Surgeon: [name], MD;  Location: [facility];  Service: General;  Laterality: Left;  wide excision hidradenitis bilateral thighs and Left groin  ? HYDRADENITIS EXCISION Left 01/13/2014  ? Procedure: WIDE EXCISION HIDRADENITIS LEFT AXILLA;  Surgeon: [name], MD;  Location: [place];  Service: General;  Laterality: Left;  ? INCISION AND DRAINAGE ABSCESS Right 03/17/2019  ? Procedure: INCISION AND DRAINAGE RIGHT THIGH ABSCESS;  Surgeon: [name], MD;  Location: [facility];  Service: General;  Laterality: Right;  ? IRRIGATION AND DEBRIDEMENT ABSCESS Right 06/02/2014  ? Procedure: IRRIGATION AND DEBRIDEMENT ABSCESS;  Surgeon: [name], MD;  Location: [place];  Service: General;  Laterality: Right;  ? IRRIGATION AND DEBRIDEMENT ABSCESS Left 09/23/2014  ? Procedure: IRRIGATION AND DEBRIDEMENT ABSCESS/LEFT THIGH;  Surgeon: [name], MD;  Location: [facility];  Service: General;  Laterality: Left;  ? LEFT HEART CATHETERIZATION WITH CORONARY ANGIOGRAM N/A 07/14/2014  ? Procedure: LEFT HEART CATHETERIZATION WITH CORONARY ANGIOGRAM;  Surgeon: [name], MD;  Location: [facility] CATH LAB;  Service: Cardiovascular;  Laterality: N/A;  ? TONSILLECTOMY    ? WISDOM TOOTH EXTRACTION    ? ? ?Family Psychiatric History: Reviewed. ? ?Family History:  ?Family History  ?Problem Relation Age of Onset  ? Cancer Mother   ? Anxiety disorder Mother   ? Depression Mother   ? Sexual abuse Mother   ? Breast cancer Mother 4  ? Hyperlipidemia Sister   ? Hypertension Sister   ? Sexual abuse Sister   ? Hypertension Father   ? Anxiety disorder Father   ? Depression Father   ? Drug abuse Father   ? Stroke Paternal Uncle   ? ADD / ADHD Paternal Uncle   ? Anxiety disorder Paternal Uncle   ? Anxiety disorder Maternal Aunt   ? Seizures Maternal Aunt   ? Anxiety disorder Paternal Aunt   ? Anxiety disorder Maternal Uncle   ? ADD / ADHD Cousin   ?  ADD / ADHD Daughter   ? ? ?Social History:  ?Social History  ? ?Socioeconomic History  ? Marital status: Married  ?  Spouse name: Louis Meckel  ? Number of children: Not on file  ? Years of education: Not on file  ? Highest education level: Not on file  ?Occupational History  ? Not on file  ?Tobacco Use  ? Smoking status: Former  ?  Packs/day: 1.00  ?  Years: 23.00  ?  Pack years: 23.00  ?  Types: Cigarettes  ?  Quit date: 09/22/2020  ?  Years since quitting: 1.2  ? Smokeless tobacco: Never  ? Tobacco comments:  ?  Pt reports today that she quit smoking January 1st of this year. (2022)  ?Vaping Use  ? Vaping Use: Never  used  ?Substance and Sexual Activity  ? Alcohol use: No  ?  Alcohol/week: 0.0 standard drinks  ? Drug use: Not Currently  ?  Types: Marijuana  ?  Comment: 2020  ? Sexual activity: Yes  ?  Partners: Male  ?  Birth control/protection: I.U.D.  ?Other Topics Concern  ? Not on file  ?Social History Narrative  ? Not on file  ? ?Social Determinants of Health  ? ?Financial Resource Strain: Not on file  ?Food Insecurity: Not on file  ?Transportation Needs: Not on file  ?Physical Activity: Not on file  ?Stress: Not on file  ?Social Connections: Not on file  ? ? ?Allergies:  ?Allergies  ?Allergen Reactions  ? Latex Hives, Shortness Of Breath and Rash  ? Levofloxacin Shortness Of Breath and Rash  ? Moxifloxacin Shortness Of Breath and Rash  ? Oxycodone-Acetaminophen Shortness Of Breath, Swelling and Rash  ?  NORCO/VICODIN OK  ? Peach [Prunus Persica] Hives and Shortness Of Breath  ? Potassium-Containing Compounds Other (See Comments)  ?  IV ROUTE - CAUSES VEINS TO COLLAPS; Reports that it is undiluted K only  ? Prednisone Other (See Comments)  ?  SEVERE ELEVATION OF BLOOD SUGAR. Able to tolerate 40 mg  ? Propoxyphene N-Acetaminophen Swelling  ?  SWELLING OF FACE AND THROAT  ? Rosiglitazone Maleate Swelling  ?  SWELLING OF FACE AND LEGS  ? Xolair [Omalizumab] Other (See Comments)  ?  Rash and anaphylaxis  ?  Adhesive [Tape] Hives, Itching and Rash  ? Morphine And Related Other (See Comments)  ?  Causes hallucinations  ? Prozac [Fluoxetine Hcl] Other (See Comments)  ?  Made her very aggressive   ? Chantix [Varenicline]

## 2022-01-06 ENCOUNTER — Telehealth (HOSPITAL_BASED_OUTPATIENT_CLINIC_OR_DEPARTMENT_OTHER): Payer: Medicaid Other | Admitting: Psychiatry

## 2022-01-06 ENCOUNTER — Encounter (HOSPITAL_COMMUNITY): Payer: Self-pay | Admitting: Psychiatry

## 2022-01-06 DIAGNOSIS — F3181 Bipolar II disorder: Secondary | ICD-10-CM

## 2022-01-06 MED ORDER — VILAZODONE HCL 20 MG PO TABS: 40.0000 mg | ORAL_TABLET | Freq: Every day | ORAL | 1 refills | Status: DC

## 2022-01-06 MED ORDER — LAMOTRIGINE 100 MG PO TABS: 100.0000 mg | ORAL_TABLET | Freq: Two times a day (BID) | ORAL | 1 refills | Status: DC
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
## 2022-02-03 ENCOUNTER — Encounter (HOSPITAL_COMMUNITY): Payer: Self-pay | Admitting: Psychiatry

## 2022-02-03 ENCOUNTER — Telehealth (HOSPITAL_BASED_OUTPATIENT_CLINIC_OR_DEPARTMENT_OTHER): Payer: Medicaid Other | Admitting: Psychiatry

## 2022-02-03 DIAGNOSIS — F3181 Bipolar II disorder: Secondary | ICD-10-CM | POA: Diagnosis not present

## 2022-02-03 MED ORDER — CLONAZEPAM 0.5 MG PO TABS: 0.5000 mg | ORAL_TABLET | Freq: Two times a day (BID) | ORAL | 1 refills | Status: DC | PRN

## 2022-02-03 MED ORDER — LAMOTRIGINE 100 MG PO TABS: 100.0000 mg | ORAL_TABLET | Freq: Two times a day (BID) | ORAL | 0 refills | Status: DC

## 2022-02-03 MED ORDER — PRAZOSIN HCL 1 MG PO CAPS: 1.0000 mg | ORAL_CAPSULE | Freq: Every day | ORAL | 0 refills | Status: DC

## 2022-02-03 MED ORDER — VILAZODONE HCL 20 MG PO TABS: 40.0000 mg | ORAL_TABLET | Freq: Every day | ORAL | 0 refills | Status: DC
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
## 2022-03-17 ENCOUNTER — Encounter (HOSPITAL_COMMUNITY): Payer: Self-pay | Admitting: Psychiatry

## 2022-03-17 ENCOUNTER — Telehealth (HOSPITAL_BASED_OUTPATIENT_CLINIC_OR_DEPARTMENT_OTHER): Payer: Medicaid Other | Admitting: Psychiatry

## 2022-03-17 DIAGNOSIS — F3181 Bipolar II disorder: Secondary | ICD-10-CM

## 2022-03-17 MED ORDER — LAMOTRIGINE 100 MG PO TABS: 100.0000 mg | ORAL_TABLET | Freq: Two times a day (BID) | ORAL | 1 refills | Status: DC

## 2022-03-17 MED ORDER — VILAZODONE HCL 20 MG PO TABS: 40.0000 mg | ORAL_TABLET | Freq: Every day | ORAL | 1 refills | Status: DC

## 2022-03-17 MED ORDER — CLONAZEPAM 0.5 MG PO TABS: 0.5000 mg | ORAL_TABLET | Freq: Two times a day (BID) | ORAL | 1 refills | Status: DC | PRN

## 2022-04-18 ENCOUNTER — Encounter (HOSPITAL_COMMUNITY): Payer: Self-pay | Admitting: Psychiatry

## 2022-04-18 ENCOUNTER — Telehealth (HOSPITAL_BASED_OUTPATIENT_CLINIC_OR_DEPARTMENT_OTHER): Payer: Medicaid Other | Admitting: Psychiatry

## 2022-04-18 DIAGNOSIS — F3181 Bipolar II disorder: Secondary | ICD-10-CM

## 2022-04-18 MED ORDER — CLONAZEPAM 0.5 MG PO TABS
0.5000 mg | ORAL_TABLET | Freq: Two times a day (BID) | ORAL | 1 refills | Status: DC | PRN
Start: 2022-04-18 — End: 2022-06-09

## 2022-04-18 MED ORDER — VILAZODONE HCL 20 MG PO TABS: 40.0000 mg | ORAL_TABLET | Freq: Every day | ORAL | 1 refills | Status: DC

## 2022-04-18 MED ORDER — LAMOTRIGINE 100 MG PO TABS: 100.0000 mg | ORAL_TABLET | Freq: Two times a day (BID) | ORAL | 1 refills | Status: DC

## 2022-04-18 NOTE — Progress Notes (Signed)
[place] MD/PA/NP OP Progress Note  04/18/2022 11:07 AM [name]  MRN:  [id number]   Interview was conducted by phone and I verified that I was speaking with the correct person using two identifiers. I discussed the limitations of evaluation and management by telemedicine and  the availability of in person appointments.   Participants in the visit:  patient (location - home); physician (location - home office). Duration 20 minutes   Chief Complaint:  returns for medication management   HPI: 41 yo divorced female with hx of PTSD, bipolar II disorder and GAD/panic disorder.    [name] reports that she has been " doing better".  She describes  mood has generally been improved/"better". She is functioning well in her daily activities-describes a busy lifestyle at this time, related to work and Ambulance person.  She reports she is now working full-time and is doing well at [employer] her job. Her son , who has had medical issues , is currently doing well . Her daughter has a history of truancy and of being bullied at school- patient is working on starting her daughter with  home schooling starting this year.  Describes mood as stable and presents euthymic , with a reactive/appropriate affect . Denies medication side effects at this time .            Visit Diagnosis:  No diagnosis found.  Past Psychiatric History: Please see intake H&P.  Past Medical History:  Past Medical History:  Diagnosis Date   Abscess of left axilla - PREVOTELLA BIVIA & Staph Coag Neg 07/12/2012   MODERATE PREVOTELLA BIVIA Note: BETA LACTAMASE NEGATIVE     Asthma    as a child   Cancer ([place])    skin tag rt breast   Cellulitis 06/01/2014   RT INNER THIGH   Depression    hx   Diabetes ([place])    Diabetes mellitus    IDDM, Insulin pump; followed by Dr. [name]   DVT (deep vein thrombosis) in pregnancy    GERD (gastroesophageal reflux disease)    no current meds.   Heart murmur     Hidradenitis 04/2012   bilat. thighs, left groin - open areas on thighs   Hx MRSA infection    Hx of blood clots    in left leg   Hydradenitis    PCOS (polycystic ovarian syndrome)    Pneumonia    hx   SVT (supraventricular tachycardia) ([place]) 06/2017   TIA (transient ischemic attack) 2019   Vitamin D insufficiency     Past Surgical History:  Procedure Laterality Date   BREAST EXCISIONAL BIOPSY Right    BREAST SURGERY     right lumpectomy   BUNIONECTOMY     left   CARDIAC CATHETERIZATION     CESAREAN SECTION N/A 06/02/2021   Procedure: CESAREAN SECTION;  Surgeon: Paula Compton, MD;  Location: [facility];  Service: Obstetrics;  Laterality: N/A;   CHOLECYSTECTOMY  12/02/2006   lap. chole.   DILATION AND EVACUATION  02/14/2007   ESOPHAGOGASTRODUODENOSCOPY  11/17/2011   Procedure: ESOPHAGOGASTRODUODENOSCOPY (EGD);  Surgeon: [name], MD;  Location: [facility];  Service: Endoscopy;  Laterality: N/A;   EYE SURGERY     exc. stye left eye   HYDRADENITIS EXCISION  04/30/2012   Procedure: EXCISION HYDRADENITIS GROIN;  Surgeon: [name], MD;  Location: [facility];  Service: General;  Laterality: Left;  wide excision hidradenitis bilateral thighs and Left groin   HYDRADENITIS  EXCISION Left 01/13/2014   Procedure: WIDE EXCISION HIDRADENITIS LEFT AXILLA;  Surgeon: [name], MD;  Location: [facility];  Service: General;  Laterality: Left;   INCISION AND DRAINAGE ABSCESS Right 03/17/2019   Procedure: INCISION AND DRAINAGE RIGHT THIGH ABSCESS;  Surgeon: [name], MD;  Location: [facility];  Service: General;  Laterality: Right;   IRRIGATION AND DEBRIDEMENT ABSCESS Right 06/02/2014   Procedure: IRRIGATION AND DEBRIDEMENT ABSCESS;  Surgeon: [name], MD;  Location: [facility];  Service: General;  Laterality: Right;   IRRIGATION AND DEBRIDEMENT ABSCESS Left 09/23/2014   Procedure: IRRIGATION AND DEBRIDEMENT ABSCESS/LEFT THIGH;  Surgeon: [name], MD;   Location: [facility];  Service: General;  Laterality: Left;   LEFT HEART CATHETERIZATION WITH CORONARY ANGIOGRAM N/A 07/14/2014   Procedure: LEFT HEART CATHETERIZATION WITH CORONARY ANGIOGRAM;  Surgeon: [name], MD;  Location: [facility];  Service: Cardiovascular;  Laterality: N/A;   TONSILLECTOMY     WISDOM TOOTH EXTRACTION      Family Psychiatric History: Reviewed.  Family History:  Family History  Problem Relation Age of Onset   Cancer Mother    Anxiety disorder Mother    Depression Mother    Sexual abuse Mother    Breast cancer Mother 20   Hyperlipidemia Sister    Hypertension Sister    Sexual abuse Sister    Hypertension Father    Anxiety disorder Father    Depression Father    Drug abuse Father    Stroke Paternal Uncle    ADD / ADHD Paternal Uncle    Anxiety disorder Paternal Uncle    Anxiety disorder Maternal Aunt    Seizures Maternal Aunt    Anxiety disorder Paternal Aunt    Anxiety disorder Maternal Uncle    ADD / ADHD Cousin    ADD / ADHD Daughter     Social History:  Social History   Socioeconomic History   Marital status: Married    Spouse name: Louis Meckel   Number of children: Not on file   Years of education: Not on file   Highest education level: Not on file  Occupational History   Not on file  Tobacco Use   Smoking status: Former    Packs/day: 1.00    Years: 23.00    Total pack years: 23.00    Types: Cigarettes    Quit date: 09/22/2020    Years since quitting: 1.5   Smokeless tobacco: Never   Tobacco comments:    Pt reports today that she quit smoking January 1st of this year. (2022)  Vaping Use   Vaping Use: Never used  Substance and Sexual Activity   Alcohol use: No    Alcohol/week: 0.0 standard drinks of alcohol   Drug use: Not Currently    Types: Marijuana    Comment: 2020   Sexual activity: Yes    Partners: Male    Birth control/protection: I.U.D.  Other Topics Concern   Not on file  Social History Narrative   Not on file    Social Determinants of Health   Financial Resource Strain: Medium Risk (11/24/2018)   Overall Financial Resource Strain (CARDIA)    Difficulty of Paying Living Expenses: Somewhat hard  Food Insecurity: Food Insecurity Present (11/24/2018)   Hunger Vital Sign    Worried About Running Out of Food in the Last Year: Often true    Ran Out of Food in the Last Year: Sometimes true  Transportation Needs: No Transportation Needs (11/24/2018)   PRAPARE -  Hydrologist (Medical): No    Lack of Transportation (Non-Medical): No  Physical Activity: Inactive (11/24/2018)   Exercise Vital Sign    Days of Exercise per Week: 0 days    Minutes of Exercise per Session: 0 min  Stress: Stress Concern Present (11/24/2018)   [place]    Feeling of Stress : Very much  Social Connections: Socially Integrated (11/24/2018)   Social Connection and Isolation Panel [NHANES]    Frequency of Communication with Friends and Family: Twice a week    Frequency of Social Gatherings with Friends and Family: Once a week    Attends Religious Services: 1 to 4 times per year    Active Member of [employer] or Organizations: Yes    Attends Archivist Meetings: Not on file    Marital Status: Living with partner    Allergies:  Allergies  Allergen Reactions   Latex Hives, Shortness Of Breath and Rash   Levofloxacin Shortness Of Breath and Rash   Moxifloxacin Shortness Of Breath and Rash   Oxycodone-Acetaminophen Shortness Of Breath, Swelling and Rash    NORCO/VICODIN OK   Peach [Prunus Persica] Hives and Shortness Of Breath   Potassium-Containing Compounds Other (See Comments)    IV ROUTE - CAUSES VEINS TO COLLAPS; Reports that it is undiluted K only   Prednisone Other (See Comments)    SEVERE ELEVATION OF BLOOD SUGAR. Able to tolerate 40 mg   Propoxyphene N-Acetaminophen Swelling    SWELLING OF FACE AND THROAT   Rosiglitazone  Maleate Swelling    SWELLING OF FACE AND LEGS   Xolair [Omalizumab] Other (See Comments)    Rash and anaphylaxis   Adhesive [Tape] Hives, Itching and Rash   Morphine And Related Other (See Comments)    Causes hallucinations   Prozac [Fluoxetine Hcl] Other (See Comments)    Made her very aggressive    Chantix [Varenicline]     dreams   Citrullus Vulgaris Nausea And Vomiting    Facial swelling   Humira [Adalimumab]     Does not remember    Septra [Sulfamethoxazole-Trimethoprim]    Zyvox [Linezolid]     Edema    Cefaclor Rash   Keflex [Cephalexin] Diarrhea and Rash    REACTION: severe migraine   Promethazine Hcl Other (See Comments)    IV ROUTE ONLY - JITTERY FEELING. Patient reports that it is mild and she has used promethazine since then  PO tablet ok   Sulfadiazine Rash    Metabolic Disorder Labs: Lab Results  Component Value Date   HGBA1C 6.9 (H) 05/30/2021   MPG 151.33 05/30/2021   MPG 280.48 12/25/2017   No results found for: "PROLACTIN" Lab Results  Component Value Date   CHOL 193 03/22/2018   TRIG 90.0 03/22/2018   HDL 62.80 03/22/2018   CHOLHDL 3 03/22/2018   VLDL 18.0 03/22/2018   LDLCALC 113 (H) 03/22/2018   LDLCALC 88 12/19/2015   Lab Results  Component Value Date   TSH 1.43 03/22/2018   TSH 0.773 12/25/2017    Therapeutic Level Labs: No results found for: "LITHIUM" No results found for: "VALPROATE" No results found for: "CBMZ"  Current Medications: Current Outpatient Medications  Medication Sig Dispense Refill   albuterol (PROVENTIL) (2.5 MG/3ML) 0.083% nebulizer solution Take 3 mLs (2.5 mg total) by nebulization every 6 (six) hours as needed for wheezing or shortness of breath. 75 mL 12   BAQSIMI TWO PACK 3  MG/DOSE POWD Place 3 mg into both nostrils daily as needed. Low CBG     clonazePAM (KLONOPIN) 0.5 MG tablet Take 1 tablet (0.5 mg total) by mouth 2 (two) times daily as needed for anxiety. 5 tablet 1   glucose blood ([employer] TEST STRIPS)  test strip 1 each by Other route See admin instructions.      HUMALOG 100 UNIT/ML injection 20-22 units pre-meal as per sliding scale (Patient taking differently: 150 Units every 3 (three) days. Using Omnipod pump) 10 mL 11   lamoTRIgine (LAMICTAL) 100 MG tablet Take 1 tablet (100 mg total) by mouth 2 (two) times daily. 60 tablet 1   Prenatal Vit-Fe Fumarate-FA (PRENATAL MULTIVITAMIN) TABS tablet Take 1 tablet by mouth daily at 12 noon.     Vilazodone HCl (VIIBRYD) 20 MG TABS Take 2 tablets (40 mg total) by mouth daily. 60 tablet 1   No current facility-administered medications for this visit.      Psychiatric Specialty Exam: please take into account limitations in obtaining a full MSE in the context of phone communication ROS - does not endorse    unknown if currently breastfeeding.There is no height or weight on file to calculate BMI.  General Appearance: NA  Eye Contact:  NA  Speech:  Normal   Volume:  Normal  Mood:  reports mood as improved , " doing better" and presents euthymic, with reactive, full range of affect   Affect:  appropriate   Thought Process:  Goal Directed and Linear  Orientation:  Full (Time, Place, and Person)  Thought Content: denies hallucinations, no delusions expressed, presents future oriented .   Suicidal Thoughts:  No- no  suicidal or self injurious ideations   Homicidal Thoughts:  No- no HI   Memory:  Recent and remote grossly intact   Judgement:  Present   Insight:  Present   Psychomotor Activity:  NA  Concentration:  Grossly intact    Recall:  Good  Fund of Knowledge: Good  Language: Good  Akathisia:  Negative  Handed:  Right  AIMS (if indicated): not done  Assets:  Communication Skills Desire for Improvement Financial Resources/Insurance Housing Social Support Talents/Skills  ADL's:  Intact  Cognition: WNL  Sleep:  Fair   Screenings: GAD-7    [facility] Office Visit from 04/04/2019 in [facility]  Counselor from 12/14/2018 in [facility] Counselor from 11/24/2018 in [facility]  Total GAD-7 Score '15 17 19      '$ PHQ2-9    Flowsheet Row Counselor from 10/07/2021 in [facility] Office Visit from 04/04/2019 in [facility] Counselor from 12/14/2018 in [facility] Counselor from 11/24/2018 in [facility] Patient Outreach from 03/14/2015 in [facility]  PHQ-2 Total Score '5 4 4 6 1  '$ PHQ-9 Total Score '20 16 19 24 '$ --      Flowsheet Row Counselor from 10/07/2021 in [facility] Admission (Discharged) from 05/30/2021 in [facility] Admission (Discharged) from 05/24/2021 in [facility]  C-SSRS RISK CATEGORY No Risk No Risk No Risk        Assessment and Plan: 41 yo female with hx of PTSD, bipolar 2 disorder and GAD/panic disorder.    Reports she is feeling better and describes improved mood . At this time presents with reactive /full range of affect and appears euthymic. Does  not endorse anhedonia or significant neuro-vegetative symptoms at present. No SI. Of note, currently working full time and states she is doing well in her job thus far .  Denies medication side effects. Side effects reviewed . Takes Klonopin irregularly for anxiety.       Dx: Bipolar 2 disorder; Anxiety/ GAD       Continue Viibryd 40 mgr QDAY for depression, anxiety  Continue Lamotrigine  100 mgr BID ,  for mood disorder Continue Klonopin 0.5 mgr Q 12 hours PRN for anxiety - takes irregularly  Will see in about 6  weeks She agrees to contact clinic sooner if any worsening prior  She also has crisis hotline number available if needed    Jenne Campus, MD 04/18/2022, 11:07 AM   Patient ID: [name], female   DOB: [birth year],  41 y.o.   MRN: [id number]

## 2022-05-27 ENCOUNTER — Telehealth (HOSPITAL_COMMUNITY): Payer: Self-pay | Admitting: Licensed Clinical Social Worker

## 2022-05-27 ENCOUNTER — Ambulatory Visit (INDEPENDENT_AMBULATORY_CARE_PROVIDER_SITE_OTHER): Payer: Self-pay | Admitting: Licensed Clinical Social Worker

## 2022-05-27 DIAGNOSIS — Z91199 Patient's noncompliance with other medical treatment and regimen due to unspecified reason: Secondary | ICD-10-CM

## 2022-05-27 NOTE — Progress Notes (Signed)
.  LCSW counselor attempted to connect with patient for scheduled appointment via MyChart video text request x 2 and email request with no response; also attempted to connect via phone without success. LCSW counselor left message for patient to call office number to reschedule OPT appointment.   Attempt 1: Text and email:   8:05a  Attempt 2: Text and email:  8:09a  Attempt 3: phone call: 8:17 spoke w/ pt--pt states that she forgot all about her appointment today and has two sick children. Pt wants to reschedule to tomorrow at 4pm

## 2022-05-27 NOTE — Telephone Encounter (Signed)
.  LCSW counselor attempted to connect with patient for scheduled appointment via MyChart video text request x 2 and email request with no response; also attempted to connect via phone without success. LCSW counselor left message for patient to call office number to reschedule OPT appointment.   Attempt 1: Text and email:   8:05a  Attempt 2: Text and email:  8:09a  Attempt 3: phone call: 8:17 spoke w/ pt--pt states that she forgot all about her appointment today and has two sick children. Pt wants to reschedule to tomorrow at 4pm

## 2022-05-28 ENCOUNTER — Ambulatory Visit (INDEPENDENT_AMBULATORY_CARE_PROVIDER_SITE_OTHER): Payer: Self-pay | Admitting: Licensed Clinical Social Worker

## 2022-05-28 ENCOUNTER — Telehealth (HOSPITAL_COMMUNITY): Payer: Self-pay | Admitting: Licensed Clinical Social Worker

## 2022-05-28 DIAGNOSIS — Z91199 Patient's noncompliance with other medical treatment and regimen due to unspecified reason: Secondary | ICD-10-CM

## 2022-05-28 NOTE — Progress Notes (Signed)
LCSW counselor attempted to connect with patient for scheduled in office appointment.   Pt did not show in the office for appointment.   LCSW called pt on telephone to attempt virtual session with no answer.  Phone call: 4:16p.  LMVM.  Visit will be coded as no show.

## 2022-05-28 NOTE — Telephone Encounter (Signed)
LCSW counselor attempted to connect with patient for scheduled in office appointment.   Pt did not show in the office for appointment.   LCSW called pt on telephone to attempt virtual session with no answer.  Phone call: 4:16p.  LMVM.  Visit will be coded as no show.

## 2022-06-09 ENCOUNTER — Telehealth (HOSPITAL_BASED_OUTPATIENT_CLINIC_OR_DEPARTMENT_OTHER): Payer: Medicaid Other | Admitting: Psychiatry

## 2022-06-09 ENCOUNTER — Telehealth (HOSPITAL_COMMUNITY): Payer: Medicaid Other | Admitting: Psychiatry

## 2022-06-09 ENCOUNTER — Encounter (HOSPITAL_COMMUNITY): Payer: Self-pay | Admitting: Psychiatry

## 2022-06-09 DIAGNOSIS — F3181 Bipolar II disorder: Secondary | ICD-10-CM | POA: Diagnosis not present

## 2022-06-09 MED ORDER — CLONAZEPAM 0.5 MG PO TABS: 0.5000 mg | ORAL_TABLET | Freq: Two times a day (BID) | ORAL | 1 refills | Status: DC | PRN

## 2022-06-09 MED ORDER — VILAZODONE HCL 20 MG PO TABS: 40.0000 mg | ORAL_TABLET | Freq: Every day | ORAL | 1 refills | Status: DC

## 2022-06-09 NOTE — Progress Notes (Signed)
[facility] MD/PA/NP OP Progress Note  06/09/2022 1:04 PM [name]  MRN:  [id number]   Interview was conducted by phone and I verified that I was speaking with the correct person using two identifiers. I discussed the limitations of evaluation and management by telemedicine and  the availability of in person appointments.   Participants in the visit:  patient (location - home); physician (location - home office). Duration 25 minutes   Chief Complaint:  returns for medication management   HPI: 41 yo female with hx of PTSD, bipolar II disorder and GAD/panic disorder.     [name] reports she has been doing well and in general reports that "life has been going all right".  Describes stable/overall improved mood.  She explains that she now has a job that she is enjoying (works at a [employer]) and where she feels supported.  She reports her daughter, who had been having difficulties at school and being truant at times, is now better adjusted and starting to enjoy school and related activities more, her 88-year-old son's physical health has improved (he had required cardiac surgery as an infant) and is now attending a daycare regularly.  She reports she had some tension with her significant other, as she felt that too many of the family responsibilities have been falling on her, but explains that they were able to have a conversation about this and she felt heard.  In fact, she reports that because she had been feeling better and stable she decided to stop her psychiatric medications" just to see if I needed them because I have been doing okay".  She reports that after about 2 weeks of stopping the Viibryd she did start feeling depressed and anxious again due to which she has restarted this medication and is now again taking it regularly at 40 mg daily.  She states that once she restarted it she started feeling better again and is now euthymic, as described above.  Of note, she also stopped  Lamictal at the same time.  She has not yet restarted Lamictal and states that she had noted some mild /localized skin rash at times, which is now resolved and which has not recurred since she stopped Lamictal .( Does not describe having had any generalized rash, no blistering , no mucosal compromise, no systemic symptoms) .  It is unclear whether the skin symptoms described above were related to Lamictal but her stated preference at this time is not to resume lamotrigine.  Of note, she has been diagnosed with bipolar disorder type II in the past.  We have reviewed her psychiatric history again today-mainly reports a history of depression, anxiety but has had brief episodes lasting about 2 or 3 days of increased energy, increased spending, increased libido.  She has had no hypomanic type symptoms recently nor does she endorse any at this time.  Based on the above history we reviewed medication options.  She wants to continue taking Viibryd as she states that this medication has clearly been very helpful in improving her mood and attributes her current stable mood to it.  We reviewed medication options, to include lithium, Depakote, atypical antipsychotics.  She prefers not to consider lithium or Depakote due to side effect profile/risks but he is interested in possibly starting an atypical antipsychotic as a mood stabilizer.  She prefers a medication that is not associated with weight gain and she has a history of diabetes.  We reviewed options to include Abilify, Vraylar, Latuda.  Before starting a new medication, reports that she prefers to research these medications to see which one she would feel more comfortable with and which would be most affordable with her insurance.  She reports that she will review this and contact us to let us know.  We did review potential side effects associated with atypical antipsychotics to include possible risk of metabolic symptoms, movement disorders, rare risk of  NMS.  She expresses awareness that being on antidepressant medications/not on mood stabilizer could increase the risk of developing hypomanic symptoms.  As noted, she is currently euthymic but is not endorsing or presenting any current hypomanic symptoms and scribes stable sleep.           Visit Diagnosis:  No diagnosis found.  Past Psychiatric History: Please see intake H&P.  Past Medical History:  Past Medical History:  Diagnosis Date   Abscess of left axilla - PREVOTELLA BIVIA & Staph Coag Neg 07/12/2012   MODERATE PREVOTELLA BIVIA Note: BETA LACTAMASE NEGATIVE     Asthma    as a child   Cancer ([place])    skin tag rt breast   Cellulitis 06/01/2014   RT INNER THIGH   Depression    hx   Diabetes ([place])    Diabetes mellitus    IDDM, Insulin pump; followed by Dr. [name]   DVT (deep vein thrombosis) in pregnancy    GERD (gastroesophageal reflux disease)    no current meds.   Heart murmur    Hidradenitis 04/2012   bilat. thighs, left groin - open areas on thighs   Hx MRSA infection    Hx of blood clots    in left leg   Hydradenitis    PCOS (polycystic ovarian syndrome)    Pneumonia    hx   SVT (supraventricular tachycardia) ([place]) 06/2017   TIA (transient ischemic attack) 2019   Vitamin D insufficiency     Past Surgical History:  Procedure Laterality Date   BREAST EXCISIONAL BIOPSY Right    BREAST SURGERY     right lumpectomy   BUNIONECTOMY     left   CARDIAC CATHETERIZATION     CESAREAN SECTION N/A 06/02/2021   Procedure: CESAREAN SECTION;  Surgeon: Paula Compton, MD;  Location: [facility];  Service: Obstetrics;  Laterality: N/A;   CHOLECYSTECTOMY  12/02/2006   lap. chole.   DILATION AND EVACUATION  02/14/2007   ESOPHAGOGASTRODUODENOSCOPY  11/17/2011   Procedure: ESOPHAGOGASTRODUODENOSCOPY (EGD);  Surgeon: [name], MD;  Location: [facility];  Service: Endoscopy;  Laterality: N/A;   EYE SURGERY     exc. stye left eye   HYDRADENITIS  EXCISION  04/30/2012   Procedure: EXCISION HYDRADENITIS GROIN;  Surgeon: [name], MD;  Location: [facility];  Service: General;  Laterality: Left;  wide excision hidradenitis bilateral thighs and Left groin   HYDRADENITIS EXCISION Left 01/13/2014   Procedure: WIDE EXCISION HIDRADENITIS LEFT AXILLA;  Surgeon: [name], MD;  Location: [facility];  Service: General;  Laterality: Left;   INCISION AND DRAINAGE ABSCESS Right 03/17/2019   Procedure: INCISION AND DRAINAGE RIGHT THIGH ABSCESS;  Surgeon: [name], MD;  Location: [facility];  Service: General;  Laterality: Right;   IRRIGATION AND DEBRIDEMENT ABSCESS Right 06/02/2014   Procedure: IRRIGATION AND DEBRIDEMENT ABSCESS;  Surgeon: [name], MD;  Location: [facility];  Service: General;  Laterality: Right;   IRRIGATION AND DEBRIDEMENT ABSCESS Left 09/23/2014   Procedure: IRRIGATION AND DEBRIDEMENT ABSCESS/LEFT THIGH;  Surgeon: [name],  MD;  Location: [facility];  Service: General;  Laterality: Left;   LEFT HEART CATHETERIZATION WITH CORONARY ANGIOGRAM N/A 07/14/2014   Procedure: LEFT HEART CATHETERIZATION WITH CORONARY ANGIOGRAM;  Surgeon: [name], MD;  Location: [facility];  Service: Cardiovascular;  Laterality: N/A;   TONSILLECTOMY     WISDOM TOOTH EXTRACTION      Family Psychiatric History: Reviewed.  Family History:  Family History  Problem Relation Age of Onset   Cancer Mother    Anxiety disorder Mother    Depression Mother    Sexual abuse Mother    Breast cancer Mother 64   Hyperlipidemia Sister    Hypertension Sister    Sexual abuse Sister    Hypertension Father    Anxiety disorder Father    Depression Father    Drug abuse Father    Stroke Paternal Uncle    ADD / ADHD Paternal Uncle    Anxiety disorder Paternal Uncle    Anxiety disorder Maternal Aunt    Seizures Maternal Aunt    Anxiety disorder Paternal Aunt    Anxiety disorder Maternal Uncle    ADD / ADHD Cousin    ADD / ADHD  Daughter     Social History:  Social History   Socioeconomic History   Marital status: Married    Spouse name: Louis Meckel   Number of children: Not on file   Years of education: Not on file   Highest education level: Not on file  Occupational History   Not on file  Tobacco Use   Smoking status: Former    Packs/day: 1.00    Years: 23.00    Total pack years: 23.00    Types: Cigarettes    Quit date: 09/22/2020    Years since quitting: 1.7   Smokeless tobacco: Never   Tobacco comments:    Pt reports today that she quit smoking January 1st of this year. (2022)  Vaping Use   Vaping Use: Never used  Substance and Sexual Activity   Alcohol use: No    Alcohol/week: 0.0 standard drinks of alcohol   Drug use: Not Currently    Types: Marijuana    Comment: 2020   Sexual activity: Yes    Partners: Male    Birth control/protection: I.U.D.  Other Topics Concern   Not on file  Social History Narrative   Not on file   Social Determinants of Health   Financial Resource Strain: Medium Risk (11/24/2018)   Overall Financial Resource Strain (CARDIA)    Difficulty of Paying Living Expenses: Somewhat hard  Food Insecurity: Food Insecurity Present (11/24/2018)   Hunger Vital Sign    Worried About Running Out of Food in the Last Year: Often true    Ran Out of Food in the Last Year: Sometimes true  Transportation Needs: No Transportation Needs (11/24/2018)   PRAPARE - Hydrologist (Medical): No    Lack of Transportation (Non-Medical): No  Physical Activity: Inactive (11/24/2018)   Exercise Vital Sign    Days of Exercise per Week: 0 days    Minutes of Exercise per Session: 0 min  Stress: Stress Concern Present (11/24/2018)   [place]    Feeling of Stress : Very much  Social Connections: Socially Integrated (11/24/2018)   Social Connection and Isolation Panel [NHANES]    Frequency of Communication  with Friends and Family: Twice a week    Frequency of Social Gatherings with  Friends and Family: Once a week    Attends Religious Services: 1 to 4 times per year    Active Member of [employer] or Organizations: Yes    Attends Archivist Meetings: Not on file    Marital Status: Living with partner    Allergies:  Allergies  Allergen Reactions   Latex Hives, Shortness Of Breath and Rash   Levofloxacin Shortness Of Breath and Rash   Moxifloxacin Shortness Of Breath and Rash   Oxycodone-Acetaminophen Shortness Of Breath, Swelling and Rash    NORCO/VICODIN OK   Peach [Prunus Persica] Hives and Shortness Of Breath   Potassium-Containing Compounds Other (See Comments)    IV ROUTE - CAUSES VEINS TO COLLAPS; Reports that it is undiluted K only   Prednisone Other (See Comments)    SEVERE ELEVATION OF BLOOD SUGAR. Able to tolerate 40 mg   Propoxyphene N-Acetaminophen Swelling    SWELLING OF FACE AND THROAT   Rosiglitazone Maleate Swelling    SWELLING OF FACE AND LEGS   Xolair [Omalizumab] Other (See Comments)    Rash and anaphylaxis   Adhesive [Tape] Hives, Itching and Rash   Morphine And Related Other (See Comments)    Causes hallucinations   Prozac [Fluoxetine Hcl] Other (See Comments)    Made her very aggressive    Chantix [Varenicline]     dreams   Citrullus Vulgaris Nausea And Vomiting    Facial swelling   Humira [Adalimumab]     Does not remember    Septra [Sulfamethoxazole-Trimethoprim]    Zyvox [Linezolid]     Edema    Cefaclor Rash   Keflex [Cephalexin] Diarrhea and Rash    REACTION: severe migraine   Promethazine Hcl Other (See Comments)    IV ROUTE ONLY - JITTERY FEELING. Patient reports that it is mild and she has used promethazine since then  PO tablet ok   Sulfadiazine Rash    Metabolic Disorder Labs: Lab Results  Component Value Date   HGBA1C 6.9 (H) 05/30/2021   MPG 151.33 05/30/2021   MPG 280.48 12/25/2017   No results found for: "PROLACTIN" Lab  Results  Component Value Date   CHOL 193 03/22/2018   TRIG 90.0 03/22/2018   HDL 62.80 03/22/2018   CHOLHDL 3 03/22/2018   VLDL 18.0 03/22/2018   LDLCALC 113 (H) 03/22/2018   LDLCALC 88 12/19/2015   Lab Results  Component Value Date   TSH 1.43 03/22/2018   TSH 0.773 12/25/2017    Therapeutic Level Labs: No results found for: "LITHIUM" No results found for: "VALPROATE" No results found for: "CBMZ"  Current Medications: Current Outpatient Medications  Medication Sig Dispense Refill   albuterol (PROVENTIL) (2.5 MG/3ML) 0.083% nebulizer solution Take 3 mLs (2.5 mg total) by nebulization every 6 (six) hours as needed for wheezing or shortness of breath. 75 mL 12   BAQSIMI TWO PACK 3 MG/DOSE POWD Place 3 mg into both nostrils daily as needed. Low CBG     clonazePAM (KLONOPIN) 0.5 MG tablet Take 1 tablet (0.5 mg total) by mouth 2 (two) times daily as needed for anxiety. 5 tablet 1   glucose blood ([employer] TEST STRIPS) test strip 1 each by Other route See admin instructions.      HUMALOG 100 UNIT/ML injection 20-22 units pre-meal as per sliding scale (Patient taking differently: 150 Units every 3 (three) days. Using Omnipod pump) 10 mL 11   lamoTRIgine (LAMICTAL) 100 MG tablet Take 1 tablet (100 mg total) by mouth 2 (two) times daily.  60 tablet 1   Prenatal Vit-Fe Fumarate-FA (PRENATAL MULTIVITAMIN) TABS tablet Take 1 tablet by mouth daily at 12 noon.     Vilazodone HCl (VIIBRYD) 20 MG TABS Take 2 tablets (40 mg total) by mouth daily. 60 tablet 1   No current facility-administered medications for this visit.      Psychiatric Specialty Exam: please take into account limitations in obtaining a full MSE in the context of phone communication ROS - does not endorse    unknown if currently breastfeeding.There is no height or weight on file to calculate BMI.  General Appearance: NA  Eye Contact:  NA  Speech:  Normal   Volume:  Normal  Mood: Reports mood as stable, improved, presents  euthymic  Affect:  appropriate , full range  Thought Process:  Goal Directed and Linear  Orientation:  Full (Time, Place, and Person)  Thought Content: denies hallucinations, no delusions expressed, presents future oriented .   Suicidal Thoughts:  No- no  suicidal or self injurious ideations   Homicidal Thoughts:  No- no HI   Memory:  Recent and remote grossly intact   Judgement:  Present   Insight:  Present   Psychomotor Activity:  NA  Concentration:  Grossly intact    Recall:  Good  Fund of Knowledge: Good  Language: Good  Akathisia:  Negative  Handed:  Right  AIMS (if indicated): not done  Assets:  Communication Skills Desire for Improvement Financial Resources/Insurance Housing Social Support Talents/Skills  ADL's:  Intact  Cognition: WNL  Sleep:  Fair   Screenings: GAD-7    [facility] Office Visit from 04/04/2019 in [facility] Counselor from 12/14/2018 in [facility] Counselor from 11/24/2018 in [facility]  Total GAD-7 Score '15 17 19      '$ PHQ2-9    Flowsheet Row Counselor from 10/07/2021 in [facility] Office Visit from 04/04/2019 in [facility] Counselor from 12/14/2018 in [facility] Counselor from 11/24/2018 in [facility] Patient Outreach from 03/14/2015 in [facility]  PHQ-2 Total Score '5 4 4 6 1  '$ PHQ-9 Total Score '20 16 19 24 '$ --      Flowsheet Row Counselor from 10/07/2021 in [facility] Admission (Discharged) from 05/30/2021 in [facility] Admission (Discharged) from 05/24/2021 in [facility]  C-SSRS RISK CATEGORY No Risk No Risk No Risk        Assessment and Plan: 41 yo female with hx of PTSD, bipolar 2 disorder and GAD/panic disorder.     Currently she is doing well.  Reports she is functioning well in daily activities to include employment/reports being happy in her new job.  She also reports improving /resolving family related stressors, as described above.  Of note, states that because she had been feeling so much better she decided to stop psychiatric medications to see if she needed them.  She did notice that her mood/affect worsened off Viibryd so she restarted this medication and is back to taking 40 mg daily.  She states that this medication is clearly helpful because her mood improved/normalized once she restarted.  She has some misgivings regarding restarting Lamictal because she had noticed some intermittent skin rash which appears to have resolved after she stopped Lamictal although she denies/does not endorse any symptoms suggestive of SJD.  We reviewed options to address history of  mood disorder.  Her preference is to consider an atypical antipsychotic not likely to be associated with weight gain and we reviewed options to include Latuda, Vraylar, Abilify as possible options.  Before starting a new medication she prefers to do some research, look into these medications and side effect profiles.  She states she will contact clinic when she decides which one she would be most comfortable with.  She does express understanding that there may be an increased risk of mood decompensation/emerging hypomanic or manic symptoms off mood stabilizer.     Dx: Bipolar 2 disorder; Anxiety/ GAD       Continue Viibryd 40 mgr QDAY for depression, anxiety  Lamictal was discontinued (see above) Continue Klonopin 0.5 mgr Q 12 hours PRN for anxiety -reports takes only occasionally Will see in about 4 weeks She agrees to contact clinic sooner if any worsening prior  She also has crisis hotline number available if needed    Jenne Campus, MD 06/09/2022, 1:04 PM   Patient ID: Vernon Prey, female   DOB: 16-Mar-1981, 41 y.o.   MRN:  [id number]

## 2022-06-23 ENCOUNTER — Encounter (HOSPITAL_BASED_OUTPATIENT_CLINIC_OR_DEPARTMENT_OTHER): Payer: Self-pay

## 2022-06-23 ENCOUNTER — Emergency Department (HOSPITAL_BASED_OUTPATIENT_CLINIC_OR_DEPARTMENT_OTHER)
Admission: EM | Admit: 2022-06-23 | Discharge: 2022-06-23 | Discharge status: Against Medical Advice | Payer: 59 | Attending: Emergency Medicine | Admitting: Emergency Medicine

## 2022-06-23 ENCOUNTER — Other Ambulatory Visit: Payer: Self-pay

## 2022-06-23 DIAGNOSIS — R509 Fever, unspecified: Secondary | ICD-10-CM | POA: Insufficient documentation

## 2022-06-23 DIAGNOSIS — Z20822 Contact with and (suspected) exposure to covid-19: Secondary | ICD-10-CM | POA: Diagnosis not present

## 2022-06-23 DIAGNOSIS — M7918 Myalgia, other site: Secondary | ICD-10-CM | POA: Insufficient documentation

## 2022-06-23 DIAGNOSIS — Z5321 Procedure and treatment not carried out due to patient leaving prior to being seen by health care provider: Secondary | ICD-10-CM | POA: Insufficient documentation

## 2022-06-23 DIAGNOSIS — R1110 Vomiting, unspecified: Secondary | ICD-10-CM | POA: Insufficient documentation

## 2022-06-23 LAB — RESP PANEL BY RT-PCR (FLU A&B, COVID) ARPGX2
Influenza A by PCR: NEGATIVE
Influenza B by PCR: NEGATIVE
SARS Coronavirus 2 by RT PCR: NEGATIVE

## 2022-06-23 LAB — CBG MONITORING, ED: Glucose-Capillary: 294 mg/dL — ABNORMAL HIGH (ref 70–99)

## 2022-06-23 NOTE — ED Triage Notes (Addendum)
Pt states she was sent home from work today due to flu-like symptoms Symptoms started Friday Generalized aching, abd cramping, fevers and vomiting  Pt does have hx of DKA BS 294 in triage  Also states she has 2 area abscesses on rt. Buttocks and thigh that need to be drained

## 2022-06-23 NOTE — ED Notes (Signed)
Called x 3 no answer 

## 2022-06-26 ENCOUNTER — Telehealth (HOSPITAL_BASED_OUTPATIENT_CLINIC_OR_DEPARTMENT_OTHER): Payer: 59 | Admitting: Psychiatry

## 2022-06-26 ENCOUNTER — Telehealth (HOSPITAL_COMMUNITY): Payer: Medicaid Other | Admitting: Psychiatry

## 2022-06-26 ENCOUNTER — Encounter (HOSPITAL_COMMUNITY): Payer: Self-pay | Admitting: Psychiatry

## 2022-06-26 DIAGNOSIS — F3181 Bipolar II disorder: Secondary | ICD-10-CM | POA: Diagnosis not present

## 2022-06-26 MED ORDER — CARIPRAZINE HCL 1.5 MG PO CAPS: 1.5000 mg | ORAL_CAPSULE | Freq: Every day | ORAL | 1 refills | Status: DC

## 2022-06-26 MED ORDER — VILAZODONE HCL 20 MG PO TABS: 40.0000 mg | ORAL_TABLET | Freq: Every day | ORAL | 1 refills | Status: DC

## 2022-06-26 MED ORDER — CLONAZEPAM 0.5 MG PO TABS: 0.5000 mg | ORAL_TABLET | Freq: Two times a day (BID) | ORAL | 0 refills | Status: DC | PRN

## 2022-06-26 NOTE — Progress Notes (Signed)
BH MD/PA/NP OP Progress Note  06/26/2022 1:35 PM [name]  MRN:  [id number]   Interview was conducted by phone and I verified that I was speaking with the correct person using two identifiers. I discussed the limitations of evaluation and management by telemedicine and  the availability of in person appointments.   Participants in the visit:  patient (location - home); physician (location - home office). Duration 25 minutes   Chief Complaint:  returns for medication management   HPI: 41 yo female with hx of PTSD, bipolar II disorder and GAD/panic disorder.    She reports she has been doing "OK". She reports she is doing well at work which she enjoys and describes workplace as friendly/supportive of her. Some of her chronic family stressors have tended to improve-her daughter is doing better and happier (currently homeschooled by patient's mother), her son who is now 68 months old has made progress and is thriving.  Possible hemangioma has decreased in size.  Was cleared by cardiologist following cardiac surgery as an infant.   She does endorse some ongoing marital tension, states her husband has been dealing with issues of his own  such as anxiety and self doubt, but which sometimes affect the relationship.  Mood described as generally stable although at times feeling vaguely depressed.  Denies suicidal ideations.  Denies anhedonia.  Presents future oriented.  No psychotic symptoms noted or endorsed.  With regards to medications , she reports Viibryd has been well tolerated and effective. Denies side effects.  Of note, patient had recently stopped Lamictal due to concern that it could be associated with rash (describes had experienced a mild but recurrent , without systemic symptoms ).  States that rash has not returned since she stopped lamotrigine.  Description suggests a benign rash but her preference at this time is not to resume lamotrigine .  She does endorse a history of mood  swings and brief but distinct episodes of increased energy and spending.  We have reviewed adding mood stabilizer medication and reviewed different options.  She is interested in [place].  Side effects have been reviewed, including potential risk of movement disorders ( TD) and metabolic side effects.                Visit Diagnosis:  No diagnosis found.  Past Psychiatric History: Please see intake H&P.  Past Medical History:  Past Medical History:  Diagnosis Date   Abscess of left axilla - PREVOTELLA BIVIA & Staph Coag Neg 07/12/2012   MODERATE PREVOTELLA BIVIA Note: BETA LACTAMASE NEGATIVE     Asthma    as a child   Cancer ([place])    skin tag rt breast   Cellulitis 06/01/2014   RT INNER THIGH   Depression    hx   Diabetes ([place])    Diabetes mellitus    IDDM, Insulin pump; followed by Dr. [name]   DVT (deep vein thrombosis) in pregnancy    GERD (gastroesophageal reflux disease)    no current meds.   Heart murmur    Hidradenitis 04/2012   bilat. thighs, left groin - open areas on thighs   Hx MRSA infection    Hx of blood clots    in left leg   Hydradenitis    PCOS (polycystic ovarian syndrome)    Pneumonia    hx   SVT (supraventricular tachycardia) 06/2017   TIA (transient ischemic attack) 2019   Vitamin D insufficiency     Past Surgical History:  Procedure Laterality Date   BREAST EXCISIONAL BIOPSY Right    BREAST SURGERY     right lumpectomy   BUNIONECTOMY     left   CARDIAC CATHETERIZATION     CESAREAN SECTION N/A 06/02/2021   Procedure: CESAREAN SECTION;  Surgeon: Paula Compton, MD;  Location: [facility];  Service: Obstetrics;  Laterality: N/A;   CHOLECYSTECTOMY  12/02/2006   lap. chole.   DILATION AND EVACUATION  02/14/2007   ESOPHAGOGASTRODUODENOSCOPY  11/17/2011   Procedure: ESOPHAGOGASTRODUODENOSCOPY (EGD);  Surgeon: [name], MD;  Location: [facility];  Service: Endoscopy;  Laterality: N/A;   EYE SURGERY     exc. stye left  eye   HYDRADENITIS EXCISION  04/30/2012   Procedure: EXCISION HYDRADENITIS GROIN;  Surgeon: [name], MD;  Location: [facility];  Service: General;  Laterality: Left;  wide excision hidradenitis bilateral thighs and Left groin   HYDRADENITIS EXCISION Left 01/13/2014   Procedure: WIDE EXCISION HIDRADENITIS LEFT AXILLA;  Surgeon: [name], MD;  Location: [facility];  Service: General;  Laterality: Left;   INCISION AND DRAINAGE ABSCESS Right 03/17/2019   Procedure: INCISION AND DRAINAGE RIGHT THIGH ABSCESS;  Surgeon: [name], MD;  Location: [facility];  Service: General;  Laterality: Right;   IRRIGATION AND DEBRIDEMENT ABSCESS Right 06/02/2014   Procedure: IRRIGATION AND DEBRIDEMENT ABSCESS;  Surgeon: [name], MD;  Location: [facility];  Service: General;  Laterality: Right;   IRRIGATION AND DEBRIDEMENT ABSCESS Left 09/23/2014   Procedure: IRRIGATION AND DEBRIDEMENT ABSCESS/LEFT THIGH;  Surgeon: [name], MD;  Location: [facility];  Service: General;  Laterality: Left;   LEFT HEART CATHETERIZATION WITH CORONARY ANGIOGRAM N/A 07/14/2014   Procedure: LEFT HEART CATHETERIZATION WITH CORONARY ANGIOGRAM;  Surgeon: [name], MD;  Location: [facility];  Service: Cardiovascular;  Laterality: N/A;   TONSILLECTOMY     WISDOM TOOTH EXTRACTION      Family Psychiatric History: Reviewed.  Family History:  Family History  Problem Relation Age of Onset   Cancer Mother    Anxiety disorder Mother    Depression Mother    Sexual abuse Mother    Breast cancer Mother 72   Hyperlipidemia Sister    Hypertension Sister    Sexual abuse Sister    Hypertension Father    Anxiety disorder Father    Depression Father    Drug abuse Father    Stroke Paternal Uncle    ADD / ADHD Paternal Uncle    Anxiety disorder Paternal Uncle    Anxiety disorder Maternal Aunt    Seizures Maternal Aunt    Anxiety disorder Paternal Aunt    Anxiety disorder Maternal Uncle    ADD / ADHD Cousin     ADD / ADHD Daughter     Social History:  Social History   Socioeconomic History   Marital status: Married    Spouse name: Louis Meckel   Number of children: Not on file   Years of education: Not on file   Highest education level: Not on file  Occupational History   Not on file  Tobacco Use   Smoking status: Former    Packs/day: 1.00    Years: 23.00    Total pack years: 23.00    Types: Cigarettes    Quit date: 09/22/2020    Years since quitting: 1.7   Smokeless tobacco: Never   Tobacco comments:    Pt reports today that she quit smoking January 1st of this year. (2022)  Vaping  Use   Vaping Use: Never used  Substance and Sexual Activity   Alcohol use: No    Alcohol/week: 0.0 standard drinks of alcohol   Drug use: Not Currently    Types: Marijuana    Comment: 2020   Sexual activity: Yes    Partners: Male    Birth control/protection: I.U.D.  Other Topics Concern   Not on file  Social History Narrative   Not on file   Social Determinants of Health   Financial Resource Strain: Medium Risk (11/24/2018)   Overall Financial Resource Strain (CARDIA)    Difficulty of Paying Living Expenses: Somewhat hard  Food Insecurity: Food Insecurity Present (11/24/2018)   Hunger Vital Sign    Worried About Running Out of Food in the Last Year: Often true    Ran Out of Food in the Last Year: Sometimes true  Transportation Needs: No Transportation Needs (11/24/2018)   PRAPARE - Hydrologist (Medical): No    Lack of Transportation (Non-Medical): No  Physical Activity: Inactive (11/24/2018)   Exercise Vital Sign    Days of Exercise per Week: 0 days    Minutes of Exercise per Session: 0 min  Stress: Stress Concern Present (11/24/2018)   [place]    Feeling of Stress : Very much  Social Connections: Socially Integrated (11/24/2018)   Social Connection and Isolation Panel [NHANES]    Frequency of  Communication with Friends and Family: Twice a week    Frequency of Social Gatherings with Friends and Family: Once a week    Attends Religious Services: 1 to 4 times per year    Active Member of [employer] or Organizations: Yes    Attends Archivist Meetings: Not on file    Marital Status: Living with partner    Allergies:  Allergies  Allergen Reactions   Latex Hives, Shortness Of Breath and Rash   Levofloxacin Shortness Of Breath and Rash   Moxifloxacin Shortness Of Breath and Rash   Oxycodone-Acetaminophen Shortness Of Breath, Swelling and Rash    NORCO/VICODIN OK   Peach [Prunus Persica] Hives and Shortness Of Breath   Potassium-Containing Compounds Other (See Comments)    IV ROUTE - CAUSES VEINS TO COLLAPS; Reports that it is undiluted K only   Prednisone Other (See Comments)    SEVERE ELEVATION OF BLOOD SUGAR. Able to tolerate 40 mg   Propoxyphene N-Acetaminophen Swelling    SWELLING OF FACE AND THROAT   Rosiglitazone Maleate Swelling    SWELLING OF FACE AND LEGS   Xolair [Omalizumab] Other (See Comments)    Rash and anaphylaxis   Adhesive [Tape] Hives, Itching and Rash   Morphine And Related Other (See Comments)    Causes hallucinations   Prozac [Fluoxetine Hcl] Other (See Comments)    Made her very aggressive    Chantix [Varenicline]     dreams   Citrullus Vulgaris Nausea And Vomiting    Facial swelling   Humira [Adalimumab]     Does not remember    Septra [Sulfamethoxazole-Trimethoprim]    Zyvox [Linezolid]     Edema    Cefaclor Rash   Keflex [Cephalexin] Diarrhea and Rash    REACTION: severe migraine   Promethazine Hcl Other (See Comments)    IV ROUTE ONLY - JITTERY FEELING. Patient reports that it is mild and she has used promethazine since then  PO tablet ok   Sulfadiazine Rash    Metabolic Disorder Labs: Lab  Results  Component Value Date   HGBA1C 6.9 (H) 05/30/2021   MPG 151.33 05/30/2021   MPG 280.48 12/25/2017   No results found for:  "PROLACTIN" Lab Results  Component Value Date   CHOL 193 03/22/2018   TRIG 90.0 03/22/2018   HDL 62.80 03/22/2018   CHOLHDL 3 03/22/2018   VLDL 18.0 03/22/2018   LDLCALC 113 (H) 03/22/2018   LDLCALC 88 12/19/2015   Lab Results  Component Value Date   TSH 1.43 03/22/2018   TSH 0.773 12/25/2017    Therapeutic Level Labs: No results found for: "LITHIUM" No results found for: "VALPROATE" No results found for: "CBMZ"  Current Medications: Current Outpatient Medications  Medication Sig Dispense Refill   albuterol (PROVENTIL) (2.5 MG/3ML) 0.083% nebulizer solution Take 3 mLs (2.5 mg total) by nebulization every 6 (six) hours as needed for wheezing or shortness of breath. 75 mL 12   BAQSIMI TWO PACK 3 MG/DOSE POWD Place 3 mg into both nostrils daily as needed. Low CBG     clonazePAM (KLONOPIN) 0.5 MG tablet Take 1 tablet (0.5 mg total) by mouth 2 (two) times daily as needed for anxiety. 5 tablet 1   glucose blood ([employer] TEST STRIPS) test strip 1 each by Other route See admin instructions.      HUMALOG 100 UNIT/ML injection 20-22 units pre-meal as per sliding scale (Patient taking differently: 150 Units every 3 (three) days. Using Omnipod pump) 10 mL 11   Prenatal Vit-Fe Fumarate-FA (PRENATAL MULTIVITAMIN) TABS tablet Take 1 tablet by mouth daily at 12 noon.     Vilazodone HCl (VIIBRYD) 20 MG TABS Take 2 tablets (40 mg total) by mouth daily. 60 tablet 1   No current facility-administered medications for this visit.      Psychiatric Specialty Exam: please take into account limitations in obtaining a full MSE in the context of phone communication ROS - does not endorse    unknown if currently breastfeeding.There is no height or weight on file to calculate BMI.  General Appearance: NA  Eye Contact:  NA  Speech:  Normal   Volume:  Normal  Mood: Generally stable, currently presents euthymic  Affect:  appropriate , full range  Thought Process:  Goal Directed and Linear  Orientation:   Full (Time, Place, and Person)  Thought Content: denies hallucinations, no delusions expressed, presents future oriented .   Suicidal Thoughts:  No-denies any suicidal or self injurious ideations   Homicidal Thoughts:  No- no HI   Memory:  Recent and remote grossly intact   Judgement:  Present   Insight:  Present   Psychomotor Activity:  NA  Concentration:  Grossly intact    Recall:  Good  Fund of Knowledge: Good  Language: Good  Akathisia:  Negative  Handed:  Right  AIMS (if indicated): not done  Assets:  Communication Skills Desire for Improvement Financial Resources/Insurance Housing Social Support Talents/Skills  ADL's:  Intact  Cognition: WNL  Sleep:  Fair   Screenings: GAD-7    [place] Office Visit from 04/04/2019 in [facility] Counselor from 12/14/2018 in [facility] Counselor from 11/24/2018 in [facility]  Total GAD-7 Score '15 17 19      '$ PHQ2-9    Flowsheet Row Counselor from 10/07/2021 in [facility] Office Visit from 04/04/2019 in [facility] Counselor from 12/14/2018 in [facility] Counselor from 11/24/2018 in [facility]  PROGRAM Patient Outreach from 03/14/2015 in [facility]  PHQ-2 Total Score '5 4 4 6 1  '$ PHQ-9 Total Score '20 16 19 24 '$ --      Flowsheet Row ED from 06/23/2022 in [facility] Counselor from 10/07/2021 in [facility] Admission (Discharged) from 05/30/2021 in [facility]  C-SSRS RISK CATEGORY No Risk No Risk No Risk        Assessment and Plan: 41 yo female with hx of PTSD, bipolar 2 disorder and GAD/panic disorder.    [name] is currently doing well .  She reports she has been very busy in her day-to-day activities  which now mood working in addition to taking care of her children.  She describes improving stressors, reports her son's health has improved and is currently thriving and her adolescent daughter being happier and better adjusted now that she is being home schooled .  Reports she is doing well at work.  Describes some slight mood swings of short duration and feeling vaguely depressed at times but in general mood has been stable and presents euthymic. No anhedonia. No SI. Future oriented .  Responding well to Viibryd thus far, reports has been helpful and well tolerated   Because she does endorse a prior history of brief hypomanic type episodes we have have reviewed indication for mood stabilizer . Dute to history of DM, preference is to avoid medications with high likelihood of significant weight gain . Options , ( including Abilify ) were reviewed.  She is interested in Vraylar trial.  Side effects reviewed .     Dx: Bipolar 2 disorder; Anxiety/ GAD    Continue Viibryd 40 mgr QDAY for depression, anxiety  Start Vraylar 1.5 mgr QDAY initially  Side effects reviewed  Continue Klonopin 0.5 mgr Q 12 hours PRN for anxiety -reports takes only occasionally Will see in about 4 weeks She agrees to contact clinic sooner if any worsening prior  She also has crisis hotline number available if needed    Jenne Campus, MD 06/26/2022, 1:35 PM   Patient ID: [name], female   DOB: Jun 10, 1981, 41 y.o.   MRN: [id number]

## 2022-07-08 ENCOUNTER — Telehealth (HOSPITAL_COMMUNITY): Payer: Self-pay | Admitting: *Deleted

## 2022-07-08 NOTE — Telephone Encounter (Signed)
Writer spoke with pt per Dr. Parke Poisson to f/u on recent med changes; no longer taking Lamictal. Pt states that she "got rid of the husband" and considering the situation she has been doing well. Pt has been sick with cold type symptoms for several weeks she says, but will be going back to doctor today. Pt encouraged to cal lwith any questions or concerns. Pt agrees.

## 2022-07-21 ENCOUNTER — Encounter (HOSPITAL_COMMUNITY): Payer: Self-pay | Admitting: Psychiatry

## 2022-07-21 ENCOUNTER — Telehealth (HOSPITAL_BASED_OUTPATIENT_CLINIC_OR_DEPARTMENT_OTHER): Payer: 59 | Admitting: Psychiatry

## 2022-07-21 DIAGNOSIS — F411 Generalized anxiety disorder: Secondary | ICD-10-CM

## 2022-07-21 DIAGNOSIS — F3181 Bipolar II disorder: Secondary | ICD-10-CM

## 2022-07-21 MED ORDER — CLONAZEPAM 0.5 MG PO TABS: 0.5000 mg | ORAL_TABLET | Freq: Two times a day (BID) | ORAL | 1 refills | Status: DC | PRN

## 2022-07-21 MED ORDER — VILAZODONE HCL 20 MG PO TABS: 40.0000 mg | ORAL_TABLET | Freq: Every day | ORAL | 1 refills | Status: DC

## 2022-07-21 NOTE — Progress Notes (Signed)
[facility] MD/PA/NP OP Progress Note  07/21/2022 5:26 PM [name]  MRN:  [id number]   Interview was conducted by phone and I verified that I was speaking with the correct person using two identifiers. I discussed the limitations of evaluation and management by telemedicine and  the availability of in person appointments.   Participants in the visit:  patient (location - home); physician (location - home office). Duration 25 minutes   Chief Complaint:  returns for medication management   HPI: 41 yo female with hx of PTSD, bipolar II disorder and GAD/panic disorder.    Patient reports she has been doing well . 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 She reports she has been busy with work and at home but describes feeling generally satisfied with current life situation She does report she was having marital difficulties , and that they decided to separate for now .  Mood described as generally stable at this time, and presents euthymic , with an appropriate /reactive affect . Denies SI. Does not endorse anhedonia or other neurovegetative symptoms.Does not endorse mood elevation or irritability  and reports sleeping well .   Of note, reports she was recently hospitalized for pneumonia and that she was found to have been at risk of DKA. States her glycemic control has been more difficult recently due to pneumonia and management with steroids . She has an appointment to see her DM ( has history of DM type I) next week .  With regards to psychiatric medications , is taking Viibryd . She states this medication has been effective and well tolerated and describes her mood has clearly improved/stabilized on this medication. We had recently added Vraylar , but she states she had not started taking it yet .  Reviewed potential metabolic side effects of Vraylar ,  including possible risk of hyperglycemia and weight gain. We discussed options, including  other mood stabilizer options  ( recently stopped lamotrigine due to rash) .  Agrees to postpone starting Vraylar until she sees her endocrinologist and DM management reviewed .    Visit Diagnosis:  No diagnosis found.  Past Psychiatric History: Please see intake H&P.  Past Medical History:  Past Medical History:  Diagnosis Date   Abscess of left axilla - PREVOTELLA BIVIA & Staph Coag Neg 07/12/2012   MODERATE PREVOTELLA BIVIA Note: BETA LACTAMASE NEGATIVE     Asthma    as a child   Cancer ([place])    skin tag rt breast   Cellulitis 06/01/2014   RT INNER THIGH   Depression    hx   Diabetes ([place])    Diabetes mellitus    IDDM, Insulin pump; followed by Dr. [name]   DVT (deep vein thrombosis) in pregnancy    GERD (gastroesophageal reflux disease)    no current meds.   Heart murmur    Hidradenitis 04/2012   bilat. thighs, left groin - open areas on thighs   Hx MRSA infection    Hx of blood clots    in left leg   Hydradenitis    PCOS (polycystic ovarian syndrome)    Pneumonia    hx   SVT (supraventricular tachycardia) 06/2017   TIA (transient ischemic attack) 2019   Vitamin D insufficiency     Past Surgical History:  Procedure Laterality Date   BREAST EXCISIONAL BIOPSY Right    BREAST SURGERY     right lumpectomy   BUNIONECTOMY  left   CARDIAC CATHETERIZATION     CESAREAN SECTION N/A 06/02/2021   Procedure: CESAREAN SECTION;  Surgeon: Paula Compton, MD;  Location: [facility];  Service: Obstetrics;  Laterality: N/A;   CHOLECYSTECTOMY  12/02/2006   lap. chole.   DILATION AND EVACUATION  02/14/2007   ESOPHAGOGASTRODUODENOSCOPY  11/17/2011   Procedure: ESOPHAGOGASTRODUODENOSCOPY (EGD);  Surgeon: [name], MD;  Location: [facility];  Service: Endoscopy;  Laterality: N/A;   EYE SURGERY     exc. stye left eye   HYDRADENITIS EXCISION  04/30/2012   Procedure: EXCISION HYDRADENITIS GROIN;  Surgeon: [name], MD;  Location: [facility];  Service: General;  Laterality: Left;  wide excision hidradenitis bilateral thighs and Left groin   HYDRADENITIS EXCISION Left 01/13/2014   Procedure: WIDE EXCISION HIDRADENITIS LEFT AXILLA;  Surgeon: [name], MD;  Location: [facility];  Service: General;  Laterality: Left;   INCISION AND DRAINAGE ABSCESS Right 03/17/2019   Procedure: INCISION AND DRAINAGE RIGHT THIGH ABSCESS;  Surgeon: [name], MD;  Location: [facility];  Service: General;  Laterality: Right;   IRRIGATION AND DEBRIDEMENT ABSCESS Right 06/02/2014   Procedure: IRRIGATION AND DEBRIDEMENT ABSCESS;  Surgeon: [name], MD;  Location: [facility];  Service: General;  Laterality: Right;   IRRIGATION AND DEBRIDEMENT ABSCESS Left 09/23/2014   Procedure: IRRIGATION AND DEBRIDEMENT ABSCESS/LEFT THIGH;  Surgeon: [name], MD;  Location: [facility];  Service: General;  Laterality: Left;   LEFT HEART CATHETERIZATION WITH CORONARY ANGIOGRAM N/A 07/14/2014   Procedure: LEFT HEART CATHETERIZATION WITH CORONARY ANGIOGRAM;  Surgeon: [name], MD;  Location: [facility];  Service: Cardiovascular;  Laterality: N/A;   TONSILLECTOMY     WISDOM TOOTH EXTRACTION      Family Psychiatric History: Reviewed.  Family History:  Family History  Problem Relation Age of Onset   Cancer Mother    Anxiety disorder Mother    Depression Mother    Sexual abuse Mother    Breast cancer Mother 37   Hyperlipidemia Sister    Hypertension Sister    Sexual abuse Sister    Hypertension Father    Anxiety disorder Father    Depression Father    Drug abuse Father    Stroke Paternal Uncle    ADD / ADHD Paternal Uncle    Anxiety disorder Paternal Uncle    Anxiety disorder Maternal Aunt    Seizures Maternal Aunt    Anxiety disorder Paternal Aunt    Anxiety disorder Maternal Uncle    ADD / ADHD Cousin    ADD / ADHD Daughter     Social History:  Social History   Socioeconomic History   Marital status: Married    Spouse name: Louis Meckel   Number of children: Not on file   Years of education: Not on file   Highest education level: Not on file  Occupational History   Not on file  Tobacco Use   Smoking status: Former    Packs/day: 1.00    Years: 23.00    Total pack years: 23.00    Types: Cigarettes    Quit date: 09/22/2020    Years since quitting: 1.8   Smokeless tobacco: Never   Tobacco comments:    Pt reports today that she quit smoking January 1st of this year. (2022)  Vaping Use   Vaping Use: Never used  Substance and Sexual Activity   Alcohol use: No    Alcohol/week: 0.0 standard drinks of alcohol  Drug use: Not Currently    Types: Marijuana    Comment: 2020   Sexual activity: Yes    Partners: Male    Birth control/protection: I.U.D.  Other Topics Concern   Not on file  Social History Narrative   Not on file   Social Determinants of Health   Financial Resource Strain: Medium Risk (11/24/2018)   Overall Financial Resource Strain (CARDIA)    Difficulty of Paying Living Expenses: Somewhat hard  Food Insecurity: Food Insecurity Present (11/24/2018)   Hunger Vital Sign    Worried About Running Out of Food in the Last Year: Often true    Ran Out of Food in the Last Year: Sometimes true  Transportation Needs: No Transportation Needs (11/24/2018)   PRAPARE - Hydrologist (Medical): No    Lack of Transportation (Non-Medical): No  Physical Activity: Inactive (11/24/2018)   Exercise Vital Sign    Days of Exercise per Week: 0 days    Minutes of Exercise per Session: 0 min  Stress: Stress Concern Present (11/24/2018)   [place]    Feeling of Stress : Very much  Social Connections: Socially Integrated (11/24/2018)   Social Connection and Isolation Panel [NHANES]    Frequency of Communication with Friends and Family: Twice a week    Frequency of Social Gatherings with Friends and Family: Once a week    Attends  Religious Services: 1 to 4 times per year    Active Member of [employer] or Organizations: Yes    Attends Archivist Meetings: Not on file    Marital Status: Living with partner    Allergies:  Allergies  Allergen Reactions   Latex Hives, Shortness Of Breath and Rash   Levofloxacin Shortness Of Breath and Rash   Moxifloxacin Shortness Of Breath and Rash   Oxycodone-Acetaminophen Shortness Of Breath, Swelling and Rash    NORCO/VICODIN OK   Peach [Prunus Persica] Hives and Shortness Of Breath   Potassium-Containing Compounds Other (See Comments)    IV ROUTE - CAUSES VEINS TO COLLAPS; Reports that it is undiluted K only   Prednisone Other (See Comments)    SEVERE ELEVATION OF BLOOD SUGAR. Able to tolerate 40 mg   Propoxyphene N-Acetaminophen Swelling    SWELLING OF FACE AND THROAT   Rosiglitazone Maleate Swelling    SWELLING OF FACE AND LEGS   Xolair [Omalizumab] Other (See Comments)    Rash and anaphylaxis   Adhesive [Tape] Hives, Itching and Rash   Morphine And Related Other (See Comments)    Causes hallucinations   Prozac [Fluoxetine Hcl] Other (See Comments)    Made her very aggressive    Chantix [Varenicline]     dreams   Citrullus Vulgaris Nausea And Vomiting    Facial swelling   Humira [Adalimumab]     Does not remember    Septra [Sulfamethoxazole-Trimethoprim]    Zyvox [Linezolid]     Edema    Cefaclor Rash   Keflex [Cephalexin] Diarrhea and Rash    REACTION: severe migraine   Promethazine Hcl Other (See Comments)    IV ROUTE ONLY - JITTERY FEELING. Patient reports that it is mild and she has used promethazine since then  PO tablet ok   Sulfadiazine Rash    Metabolic Disorder Labs: Lab Results  Component Value Date   HGBA1C 6.9 (H) 05/30/2021   MPG 151.33 05/30/2021   MPG 280.48 12/25/2017   No results found for: "PROLACTIN"  Lab Results  Component Value Date   CHOL 193 03/22/2018   TRIG 90.0 03/22/2018   HDL 62.80 03/22/2018   CHOLHDL 3  03/22/2018   VLDL 18.0 03/22/2018   LDLCALC 113 (H) 03/22/2018   LDLCALC 88 12/19/2015   Lab Results  Component Value Date   TSH 1.43 03/22/2018   TSH 0.773 12/25/2017    Therapeutic Level Labs: No results found for: "LITHIUM" No results found for: "VALPROATE" No results found for: "CBMZ"  Current Medications: Current Outpatient Medications  Medication Sig Dispense Refill   albuterol (PROVENTIL) (2.5 MG/3ML) 0.083% nebulizer solution Take 3 mLs (2.5 mg total) by nebulization every 6 (six) hours as needed for wheezing or shortness of breath. 75 mL 12   BAQSIMI TWO PACK 3 MG/DOSE POWD Place 3 mg into both nostrils daily as needed. Low CBG     cariprazine (VRAYLAR) 1.5 MG capsule Take 1 capsule (1.5 mg total) by mouth daily. 30 capsule 1   clonazePAM (KLONOPIN) 0.5 MG tablet Take 1 tablet (0.5 mg total) by mouth 2 (two) times daily as needed for anxiety. 5 tablet 0   glucose blood ([employer] TEST STRIPS) test strip 1 each by Other route See admin instructions.      HUMALOG 100 UNIT/ML injection 20-22 units pre-meal as per sliding scale (Patient taking differently: 150 Units every 3 (three) days. Using Omnipod pump) 10 mL 11   Prenatal Vit-Fe Fumarate-FA (PRENATAL MULTIVITAMIN) TABS tablet Take 1 tablet by mouth daily at 12 noon.     Vilazodone HCl (VIIBRYD) 20 MG TABS Take 2 tablets (40 mg total) by mouth daily. 60 tablet 1   No current facility-administered medications for this visit.      Psychiatric Specialty Exam: please take into account limitations in obtaining a full MSE in the context of phone communication ROS - does not endorse    unknown if currently breastfeeding.There is no height or weight on file to calculate BMI.  General Appearance: NA  Eye Contact:  NA  Speech:  Normal   Volume:  Normal  Mood: reports mood is stable and presents euthymic  Affect:  appropriate , full range ( not expansive or irritable)   Thought Process:  Goal Directed and Linear  Orientation:   Full (Time, Place, and Person)  Thought Content: denies hallucinations, no delusions expressed, presents future oriented .   Suicidal Thoughts:  No-denies any suicidal or self injurious ideations   Homicidal Thoughts:  No- no HI   Memory:  Recent and remote grossly intact   Judgement:  Present   Insight:  Present   Psychomotor Activity:  NA  Concentration:  Grossly intact    Recall:  Good  Fund of Knowledge: Good  Language: Good  Akathisia:  Negative  Handed:  Right  AIMS (if indicated): not done  Assets:  Communication Skills Desire for Improvement Financial Resources/Insurance Housing Social Support Talents/Skills  ADL's:  Intact  Cognition: WNL  Sleep:  Fair   Screenings: GAD-7    [facility] Office Visit from 04/04/2019 in [facility] Counselor from 12/14/2018 in [facility] Counselor from 11/24/2018 in [facility]  Total GAD-7 Score '15 17 19      '$ PHQ2-9    Flowsheet Row Counselor from 10/07/2021 in [facility] Office Visit from 04/04/2019 in [facility] Counselor from 12/14/2018 in [facility] Counselor from 11/24/2018 in [facility]  Patient Outreach from 03/14/2015 in [facility]  PHQ-2 Total Score '5 4 4 6 1  '$ PHQ-9 Total Score '20 16 19 24 '$ --      Flowsheet Row ED from 06/23/2022 in [facility] Counselor from 10/07/2021 in [facility] Admission (Discharged) from 05/30/2021 in [facility]  C-SSRS RISK CATEGORY No Risk No Risk No Risk        Assessment and Plan: 41 yo female with hx of PTSD, bipolar 2 disorder and GAD/panic disorder.    She reports she is doing well in her daily activities, including home life and at work.  Describes mood  as generally stable and presents euthymic . Does not endorse significant neuro-vegetative symptoms at this time.No hypomanic /manic symptoms noted or reported .  Currently taking Viibryd . Reports as helpful and well tolerated . Denies side effects. She had recently stopped Lamictal which she had been on for a long period of time as she developed a rash. It is unclear that rash was lamotrigine related, but has improved . We considered Vraylar as mood stabilizer option , but has not started yet .  She was recently in hospital for pneumonia , hyperglycemia, and was also given a course of steroids . Reports that hyperglycemia has been harder to control and has appointment to see her Endocrinologist next week.   We reviewed  treatment options including other medication options - she is motivated in Dietitian trial. Would defer starting atypical antipsychotic ( in her case for mood stabilization ) for now until DM better controlled and she is able to review with her MD next week.   Aware of potential increased risk of mood instability as recently started on steroids . At this time presents euthymic with stable mood .   Side effects reviewed .     Dx: Bipolar 2 disorder; Anxiety/ GAD    Continue Viibryd 40 mgr QDAY for depression, anxiety  Continue Klonopin 0.5 mgr Q 12 hours PRN for anxiety -reports takes only occasionally Will see in about 3- 4 weeks She agrees to contact clinic sooner if any worsening prior  She also has crisis hotline number available if needed  Monitor Hgb A1C/ [place], MD 07/21/2022, 5:26 PM   Patient ID: Vernon Prey, female   DOB: 1980-10-24, 41 y.o.   MRN: [id number]

## 2022-08-01 ENCOUNTER — Ambulatory Visit (HOSPITAL_BASED_OUTPATIENT_CLINIC_OR_DEPARTMENT_OTHER)
Admission: RE | Admit: 2022-08-01 | Payer: Medicaid Other | Source: Home / Self Care | Admitting: Obstetrics and Gynecology

## 2022-08-01 ENCOUNTER — Encounter (HOSPITAL_BASED_OUTPATIENT_CLINIC_OR_DEPARTMENT_OTHER): Admission: RE | Payer: Self-pay | Source: Home / Self Care

## 2022-08-01 SURGERY — DILATATION AND CURETTAGE /HYSTEROSCOPY
Anesthesia: General

## 2022-08-19 ENCOUNTER — Encounter (HOSPITAL_COMMUNITY): Payer: Self-pay | Admitting: Psychiatry

## 2022-08-19 ENCOUNTER — Telehealth (HOSPITAL_BASED_OUTPATIENT_CLINIC_OR_DEPARTMENT_OTHER): Payer: 59 | Admitting: Psychiatry

## 2022-08-19 DIAGNOSIS — F3181 Bipolar II disorder: Secondary | ICD-10-CM | POA: Diagnosis not present

## 2022-08-19 MED ORDER — CARIPRAZINE HCL 1.5 MG PO CAPS: 1.5000 mg | ORAL_CAPSULE | Freq: Every day | ORAL | 1 refills | Status: DC

## 2022-08-19 NOTE — Progress Notes (Signed)
[facility] MD/PA/NP OP Progress Note  08/19/2022 2:11 PM [name]  MRN:  [id number]   Interview was conducted by phone and I verified that I was speaking with the correct person using two identifiers. I discussed the limitations of evaluation and management by telemedicine and  the availability of in person appointments.   Participants in the visit:  patient (location - home); physician (location - home office). Duration 20 minutes   Chief Complaint:  returns for medication management   HPI: 41 yo female with hx of PTSD, bipolar II disorder and GAD/panic disorder.    [name] reports she has been doing well . She states she is doing well in daily activities , including work at a medical office . She reports her husband has returned home after a brief period of separation and describes improving marital relationship . She describes mood as stable and presents euthymic with a reactive affect . No significant neuro-vegetative symptoms reported at this time . No hypomanic symptoms are reported or noted. Does not endorse insomnia at this time. No SI and presents future oriented .  Denies medication side effects at this time .  Currently taking Viibryd 40 mgr QDAY .  She has historically done well on this medication. Denies side effects and reports Viibryd has been helpful for depression and anxiety symptoms. [name] endorses history of Mood Disorder with episodes of mood instability/decreased need for sleep /irritability in the past .  She had been stable on Lamotrigine for a period of time but discontinued lamotrigine after developing  a rash. We  reviewed  medication options, including an atypical antipsychotic or a mood stabilizer such as Depakote .Her preference is to avoid Depakote or Li .  She has expressed interest in Vraylar trial- side effects/potential motor /metabolic side effects  have been reviewed .  She recently saw her endocrinologist , reports Hgb A1C was 8.0  but since was   also recovering from pneumonia and  on steroid course at the time  was given a follow appointment to follow up / recheck next month . Insulin doses were adjusted .    Writer is leaving clinic in March 2024 , after which patient, if she wants, will continue management with another [facility] psychiatrist . She expresses understanding .         Visit Diagnosis:  No diagnosis found.  Past Psychiatric History: Please see intake H&P.  Past Medical History:  Past Medical History:  Diagnosis Date   Abscess of left axilla - PREVOTELLA BIVIA & Staph Coag Neg 07/12/2012   MODERATE PREVOTELLA BIVIA Note: BETA LACTAMASE NEGATIVE     Asthma    as a child   Cancer ([place])    skin tag rt breast   Cellulitis 06/01/2014   RT INNER THIGH   Depression    hx   Diabetes ([place])    Diabetes mellitus    IDDM, Insulin pump; followed by Dr. [name]   DVT (deep vein thrombosis) in pregnancy    GERD (gastroesophageal reflux disease)    no current meds.   Heart murmur    Hidradenitis 04/2012   bilat. thighs, left groin - open areas on thighs   Hx MRSA infection    Hx of blood clots    in left leg   Hydradenitis    PCOS (polycystic ovarian syndrome)    Pneumonia    hx   SVT (supraventricular tachycardia) 06/2017   TIA (transient ischemic attack) 2019  Vitamin D insufficiency     Past Surgical History:  Procedure Laterality Date   BREAST EXCISIONAL BIOPSY Right    BREAST SURGERY     right lumpectomy   BUNIONECTOMY     left   CARDIAC CATHETERIZATION     CESAREAN SECTION N/A 06/02/2021   Procedure: CESAREAN SECTION;  Surgeon: Paula Compton, MD;  Location: [facility];  Service: Obstetrics;  Laterality: N/A;   CHOLECYSTECTOMY  12/02/2006   lap. chole.   DILATION AND EVACUATION  02/14/2007   ESOPHAGOGASTRODUODENOSCOPY  11/17/2011   Procedure: ESOPHAGOGASTRODUODENOSCOPY (EGD);  Surgeon: [name], MD;  Location: [facility];  Service: Endoscopy;  Laterality: N/A;   EYE SURGERY      exc. stye left eye   HYDRADENITIS EXCISION  04/30/2012   Procedure: EXCISION HYDRADENITIS GROIN;  Surgeon: [name], MD;  Location: [facility];  Service: General;  Laterality: Left;  wide excision hidradenitis bilateral thighs and Left groin   HYDRADENITIS EXCISION Left 01/13/2014   Procedure: WIDE EXCISION HIDRADENITIS LEFT AXILLA;  Surgeon: [name], MD;  Location: [facility];  Service: General;  Laterality: Left;   INCISION AND DRAINAGE ABSCESS Right 03/17/2019   Procedure: INCISION AND DRAINAGE RIGHT THIGH ABSCESS;  Surgeon: [name], MD;  Location: [facility];  Service: General;  Laterality: Right;   IRRIGATION AND DEBRIDEMENT ABSCESS Right 06/02/2014   Procedure: IRRIGATION AND DEBRIDEMENT ABSCESS;  Surgeon: [name], MD;  Location: [facility];  Service: General;  Laterality: Right;   IRRIGATION AND DEBRIDEMENT ABSCESS Left 09/23/2014   Procedure: IRRIGATION AND DEBRIDEMENT ABSCESS/LEFT THIGH;  Surgeon: [name], MD;  Location: [facility];  Service: General;  Laterality: Left;   LEFT HEART CATHETERIZATION WITH CORONARY ANGIOGRAM N/A 07/14/2014   Procedure: LEFT HEART CATHETERIZATION WITH CORONARY ANGIOGRAM;  Surgeon: [name], MD;  Location: [facility] CATH LAB;  Service: Cardiovascular;  Laterality: N/A;   TONSILLECTOMY     WISDOM TOOTH EXTRACTION      Family Psychiatric History: Reviewed.  Family History:  Family History  Problem Relation Age of Onset   Cancer Mother    Anxiety disorder Mother    Depression Mother    Sexual abuse Mother    Breast cancer Mother 18   Hyperlipidemia Sister    Hypertension Sister    Sexual abuse Sister    Hypertension Father    Anxiety disorder Father    Depression Father    Drug abuse Father    Stroke Paternal Uncle    ADD / ADHD Paternal Uncle    Anxiety disorder Paternal Uncle    Anxiety disorder Maternal Aunt    Seizures Maternal Aunt    Anxiety disorder Paternal Aunt    Anxiety disorder Maternal Uncle     ADD / ADHD Cousin    ADD / ADHD Daughter     Social History:  Social History   Socioeconomic History   Marital status: Married    Spouse name: Louis Meckel   Number of children: Not on file   Years of education: Not on file   Highest education level: Not on file  Occupational History   Not on file  Tobacco Use   Smoking status: Former    Packs/day: 1.00    Years: 23.00    Total pack years: 23.00    Types: Cigarettes    Quit date: 09/22/2020    Years since quitting: 1.9   Smokeless tobacco: Never   Tobacco comments:    Pt reports today that  she quit smoking January 1st of this year. (2022)  Vaping Use   Vaping Use: Never used  Substance and Sexual Activity   Alcohol use: No    Alcohol/week: 0.0 standard drinks of alcohol   Drug use: Not Currently    Types: Marijuana    Comment: 2020   Sexual activity: Yes    Partners: Male    Birth control/protection: I.U.D.  Other Topics Concern   Not on file  Social History Narrative   Not on file   Social Determinants of Health   Financial Resource Strain: Medium Risk (11/24/2018)   Overall Financial Resource Strain (CARDIA)    Difficulty of Paying Living Expenses: Somewhat hard  Food Insecurity: Food Insecurity Present (11/24/2018)   Hunger Vital Sign    Worried About Running Out of Food in the Last Year: Often true    Ran Out of Food in the Last Year: Sometimes true  Transportation Needs: No Transportation Needs (11/24/2018)   PRAPARE - Hydrologist (Medical): No    Lack of Transportation (Non-Medical): No  Physical Activity: Inactive (11/24/2018)   Exercise Vital Sign    Days of Exercise per Week: 0 days    Minutes of Exercise per Session: 0 min  Stress: Stress Concern Present (11/24/2018)   [place]    Feeling of Stress : Very much  Social Connections: Socially Integrated (11/24/2018)   Social Connection and Isolation Panel [NHANES]     Frequency of Communication with Friends and Family: Twice a week    Frequency of Social Gatherings with Friends and Family: Once a week    Attends Religious Services: 1 to 4 times per year    Active Member of [employer] or Organizations: Yes    Attends Archivist Meetings: Not on file    Marital Status: Living with partner    Allergies:  Allergies  Allergen Reactions   Latex Hives, Shortness Of Breath and Rash   Levofloxacin Shortness Of Breath and Rash   Moxifloxacin Shortness Of Breath and Rash   Oxycodone-Acetaminophen Shortness Of Breath, Swelling and Rash    NORCO/VICODIN OK   Peach [Prunus Persica] Hives and Shortness Of Breath   Potassium-Containing Compounds Other (See Comments)    IV ROUTE - CAUSES VEINS TO COLLAPS; Reports that it is undiluted K only   Prednisone Other (See Comments)    SEVERE ELEVATION OF BLOOD SUGAR. Able to tolerate 40 mg   Propoxyphene N-Acetaminophen Swelling    SWELLING OF FACE AND THROAT   Rosiglitazone Maleate Swelling    SWELLING OF FACE AND LEGS   Xolair [Omalizumab] Other (See Comments)    Rash and anaphylaxis   Adhesive [Tape] Hives, Itching and Rash   Morphine And Related Other (See Comments)    Causes hallucinations   Prozac [Fluoxetine Hcl] Other (See Comments)    Made her very aggressive    Chantix [Varenicline]     dreams   Citrullus Vulgaris Nausea And Vomiting    Facial swelling   Humira [Adalimumab]     Does not remember    Septra [Sulfamethoxazole-Trimethoprim]    Zyvox [Linezolid]     Edema    Cefaclor Rash   Keflex [Cephalexin] Diarrhea and Rash    REACTION: severe migraine   Promethazine Hcl Other (See Comments)    IV ROUTE ONLY - JITTERY FEELING. Patient reports that it is mild and she has used promethazine since then  PO tablet ok  Sulfadiazine Rash    Metabolic Disorder Labs: Lab Results  Component Value Date   HGBA1C 6.9 (H) 05/30/2021   MPG 151.33 05/30/2021   MPG 280.48 12/25/2017   No results  found for: "PROLACTIN" Lab Results  Component Value Date   CHOL 193 03/22/2018   TRIG 90.0 03/22/2018   HDL 62.80 03/22/2018   CHOLHDL 3 03/22/2018   VLDL 18.0 03/22/2018   LDLCALC 113 (H) 03/22/2018   LDLCALC 88 12/19/2015   Lab Results  Component Value Date   TSH 1.43 03/22/2018   TSH 0.773 12/25/2017    Therapeutic Level Labs: No results found for: "LITHIUM" No results found for: "VALPROATE" No results found for: "CBMZ"  Current Medications: Current Outpatient Medications  Medication Sig Dispense Refill   albuterol (PROVENTIL) (2.5 MG/3ML) 0.083% nebulizer solution Take 3 mLs (2.5 mg total) by nebulization every 6 (six) hours as needed for wheezing or shortness of breath. 75 mL 12   BAQSIMI TWO PACK 3 MG/DOSE POWD Place 3 mg into both nostrils daily as needed. Low CBG     clonazePAM (KLONOPIN) 0.5 MG tablet Take 1 tablet (0.5 mg total) by mouth 2 (two) times daily as needed for anxiety. 5 tablet 1   glucose blood ([employer] TEST STRIPS) test strip 1 each by Other route See admin instructions.      HUMALOG 100 UNIT/ML injection 20-22 units pre-meal as per sliding scale (Patient taking differently: 150 Units every 3 (three) days. Using Omnipod pump) 10 mL 11   Prenatal Vit-Fe Fumarate-FA (PRENATAL MULTIVITAMIN) TABS tablet Take 1 tablet by mouth daily at 12 noon.     Vilazodone HCl (VIIBRYD) 20 MG TABS Take 2 tablets (40 mg total) by mouth daily. 60 tablet 1   No current facility-administered medications for this visit.      Psychiatric Specialty Exam: please take into account limitations in obtaining a full MSE in the context of phone communication ROS - does not endorse    unknown if currently breastfeeding.There is no height or weight on file to calculate BMI.  General Appearance: NA  Eye Contact:  NA  Speech:  Normal - no pressured speech   Volume:  Normal  Mood: reports mood is stable and presents euthymic  Affect:  appropriate , full range ( not expansive or  irritable)   Thought Process:  Goal Directed and Linear  Orientation:  Full (Time, Place, and Person)  Thought Content: denies hallucinations, no delusions expressed, presents future oriented .   Suicidal Thoughts:  No-denies any suicidal or self injurious ideations   Homicidal Thoughts:  No- no HI   Memory:  Recent and remote grossly intact   Judgement:  Present   Insight:  Present   Psychomotor Activity:  NA  Concentration:  Grossly intact    Recall:  Good  Fund of Knowledge: Good  Language: Good  Akathisia:  Negative  Handed:  Right  AIMS (if indicated): not done  Assets:  Communication Skills Desire for Improvement Financial Resources/Insurance Housing Social Support Talents/Skills  ADL's:  Intact  Cognition: WNL  Sleep:  Fair   Screenings: GAD-7    [place] Office Visit from 04/04/2019 in [facility] Counselor from 12/14/2018 in [facility] Counselor from 11/24/2018 in [facility]  Total GAD-7 Score '15 17 19      '$ PHQ2-9    Flowsheet Row Counselor from 10/07/2021 in [place] Office Visit from [facility]/04/2019 in [facility]  [place] Counselor from 12/14/2018 in [facility] Counselor from 11/24/2018 in [facility] Patient Outreach from 03/14/2015 in [facility]  PHQ-2 Total Score '5 4 4 6 1  '$ PHQ-9 Total Score '20 16 19 24 '$ --      Flowsheet Row ED from 06/23/2022 in [facility] Counselor from 10/07/2021 in [facility] Admission (Discharged) from 05/30/2021 in [facility]  C-SSRS RISK CATEGORY No Risk No Risk No Risk        Assessment and Plan: 41 yo female with hx of PTSD, bipolar 2 disorder and GAD/panic disorder.    She reports she is doing well in  her daily activities, including home life and work.  Describes mood as  stable and presents euthymic . Does not endorse significant neuro-vegetative symptoms at this time.No hypomanic /manic symptoms noted or reported .  Currently taking Viibryd . Reports as effective and well tolerated . Denies side effects. Reports takes Klonopin only occasionally and has not taken any over the last few weeks . Does not require refill at this time She had recently stopped Lamictal (which she had been taking for a long period of time ) after she developed a rash . States rash resolved after discontinuing lamotrigine . We reviewed other medication options, to include atypical antipsychotic ( such as Abilify)  , valproate, lithium.. Her preference at this time is for Vraylar trial. Side effects have been reviewed .   History of DM. Recently saw Endocrinoologist - reports Hgb A1C at 8.0. Insulin dose was adjusted . Reports , however, that at the time was taking steroids  ( which she is no longer taking ) and has a follow up appointment later this year   Side effects reviewed, possible drug drug interactions reviewed  .     Dx: Bipolar 2 disorder; Anxiety/ GAD    Viibryd 40 mgr QDAY for depression, anxiety  Vraylar 1.5 mgr QDAY for mood disorder  Klonopin 0.5 mgr Q 12 hours PRN for anxiety -reports takes only occasionally Will see in about 3- 4 weeks She agrees to contact clinic sooner if any worsening prior  She also has crisis hotline number available if needed     Jenne Campus, MD 08/19/2022, 2:11 PM   Patient ID: Vernon Prey, female   DOB: Jan 06, 1981, 41 y.o.   MRN: [id number]

## 2022-09-02 ENCOUNTER — Encounter (HOSPITAL_COMMUNITY): Payer: Self-pay | Admitting: Psychiatry

## 2022-09-02 ENCOUNTER — Telehealth (HOSPITAL_BASED_OUTPATIENT_CLINIC_OR_DEPARTMENT_OTHER): Payer: 59 | Admitting: Psychiatry

## 2022-09-02 DIAGNOSIS — F3181 Bipolar II disorder: Secondary | ICD-10-CM

## 2022-09-02 MED ORDER — CARIPRAZINE HCL 1.5 MG PO CAPS: 1.5000 mg | ORAL_CAPSULE | Freq: Every day | ORAL | 1 refills | Status: DC

## 2022-09-02 MED ORDER — VILAZODONE HCL 20 MG PO TABS: 40.0000 mg | ORAL_TABLET | Freq: Every day | ORAL | 1 refills | Status: DC

## 2022-09-02 NOTE — Progress Notes (Signed)
[facility] MD/PA/NP OP Progress Note  09/02/2022 2:39 PM MARLON VONRUDEN  MRN:  [id number]   Interview was conducted by phone and I verified that I was speaking with the correct person using two identifiers. I discussed the limitations of evaluation and management by telemedicine and  the availability of in person appointments.   Participants in the visit:  patient (location - home); physician (location - home office). Duration 20 minutes   Chief Complaint:  returns for medication management   HPI: 41 yo female with hx of PTSD, bipolar II disorder and GAD/panic disorder.    [name] reports she has been doing well . 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 States she is looking at other job opportunities that may pay better and be closer to her home. Reports improving family dynamics , following return of her husband ( they had briefly separated earlier this year )   States she feels Vraylar, which was recently added , has been well tolerated and has been helpful. She reports her mood has been better overall and she describes a subjective feeling of improved interest in daily activities and improved motivation. She also reports improved anxiety and states she has not needed to take Klonopin in more than two weeks , which is prescribed at 0.5 mgr BID PRN for anxiety   Currently presents euthymic, with a full range of affect . No SI and presents future oriented .  She denies medication side effects at this time . Side effects , including potential for NMS, potential for abnormal movements /motor side effects and for metabolic side effects, have been reviewed .  Reports diabetic management was recently adjusted ( on insulin pump) by her endocrinologist .   We again reviewed termination issues, writer  is leaving clinic in March 2024 , after which patient may continue management with another [facility] clinician  . She expresses understanding .         Visit Diagnosis:  No diagnosis found.  Past  Psychiatric History: Please see intake H&P.  Past Medical History:  Past Medical History:  Diagnosis Date   Abscess of left axilla - PREVOTELLA BIVIA & Staph Coag Neg 07/12/2012   MODERATE PREVOTELLA BIVIA Note: BETA LACTAMASE NEGATIVE     Asthma    as a child   Cancer ([place])    skin tag rt breast   Cellulitis 06/01/2014   RT INNER THIGH   Depression    hx   Diabetes ([place])    Diabetes mellitus    IDDM, Insulin pump; followed by Dr. [name]   DVT (deep vein thrombosis) in pregnancy    GERD (gastroesophageal reflux disease)    no current meds.   Heart murmur    Hidradenitis 04/2012   bilat. thighs, left groin - open areas on thighs   Hx MRSA infection    Hx of blood clots    in left leg   Hydradenitis    PCOS (polycystic ovarian syndrome)    Pneumonia    hx   SVT (supraventricular tachycardia) 06/2017   TIA (transient ischemic attack) 2019   Vitamin D insufficiency     Past Surgical History:  Procedure Laterality Date   BREAST EXCISIONAL BIOPSY Right    BREAST SURGERY     right lumpectomy   BUNIONECTOMY     left   CARDIAC CATHETERIZATION     CESAREAN SECTION N/A 06/02/2021   Procedure: CESAREAN SECTION;  Surgeon: Paula Compton, MD;  [facility];  Service: Obstetrics;  Laterality: N/A;   CHOLECYSTECTOMY  12/02/2006   lap. chole.   DILATION AND EVACUATION  02/14/2007   ESOPHAGOGASTRODUODENOSCOPY  11/17/2011   Procedure: ESOPHAGOGASTRODUODENOSCOPY (EGD);  Surgeon: [name], MD;  Location: [facility];  Service: Endoscopy;  Laterality: N/A;   EYE SURGERY     exc. stye left eye   HYDRADENITIS EXCISION  04/30/2012   Procedure: EXCISION HYDRADENITIS GROIN;  Surgeon: [name], MD;  Location: [place];  Service: General;  Laterality: Left;  wide excision hidradenitis bilateral thighs and Left groin   HYDRADENITIS EXCISION Left 01/13/2014   Procedure: WIDE EXCISION HIDRADENITIS LEFT AXILLA;  Surgeon: [name], MD;   Location: [place];  Service: General;  Laterality: Left;   INCISION AND DRAINAGE ABSCESS Right 03/17/2019   Procedure: INCISION AND DRAINAGE RIGHT THIGH ABSCESS;  Surgeon: [name], MD;  Location: [place];  Service: General;  Laterality: Right;   IRRIGATION AND DEBRIDEMENT ABSCESS Right 06/02/2014   Procedure: IRRIGATION AND DEBRIDEMENT ABSCESS;  Surgeon: [name], MD;  Location: [place];  Service: General;  Laterality: Right;   IRRIGATION AND DEBRIDEMENT ABSCESS Left 09/23/2014   Procedure: IRRIGATION AND DEBRIDEMENT ABSCESS/LEFT THIGH;  Surgeon: [name], MD;  Location: [place];  Service: General;  Laterality: Left;   LEFT HEART CATHETERIZATION WITH CORONARY ANGIOGRAM N/A 07/14/2014   Procedure: LEFT HEART CATHETERIZATION WITH CORONARY ANGIOGRAM;  Surgeon: [name], MD;  Location: [facility] CATH LAB;  Service: Cardiovascular;  Laterality: N/A;   TONSILLECTOMY     WISDOM TOOTH EXTRACTION      Family Psychiatric History: Reviewed.  Family History:  Family History  Problem Relation Age of Onset   Cancer Mother    Anxiety disorder Mother    Depression Mother    Sexual abuse Mother    Breast cancer Mother 32   Hyperlipidemia Sister    Hypertension Sister    Sexual abuse Sister    Hypertension Father    Anxiety disorder Father    Depression Father    Drug abuse Father    Stroke Paternal Uncle    ADD / ADHD Paternal Uncle    Anxiety disorder Paternal Uncle    Anxiety disorder Maternal Aunt    Seizures Maternal Aunt    Anxiety disorder Paternal Aunt    Anxiety disorder Maternal Uncle    ADD / ADHD Cousin    ADD / ADHD Daughter     Social History:  Social History   Socioeconomic History   Marital status: Married    Spouse name: Louis Meckel   Number of children: Not on file   Years of education: Not on file   Highest education level: Not on file  Occupational History   Not on file  Tobacco Use   Smoking status: Former    Packs/day: 1.00    Years: 23.00     Total pack years: 23.00    Types: Cigarettes    Quit date: 09/22/2020    Years since quitting: 1.9   Smokeless tobacco: Never   Tobacco comments:    Pt reports today that she quit smoking January 1st of this year. (2022)  Vaping Use   Vaping Use: Never used  Substance and Sexual Activity   Alcohol use: No    Alcohol/week: 0.0 standard drinks of alcohol   Drug use: Not Currently    Types: Marijuana    Comment: 2020   Sexual activity: Yes    Partners: Male    Birth  control/protection: I.U.D.  Other Topics Concern   Not on file  Social History Narrative   Not on file   Social Determinants of Health   Financial Resource Strain: Medium Risk (11/24/2018)   Overall Financial Resource Strain (CARDIA)    Difficulty of Paying Living Expenses: Somewhat hard  Food Insecurity: Food Insecurity Present (11/24/2018)   Hunger Vital Sign    Worried About Running Out of Food in the Last Year: Often true    Ran Out of Food in the Last Year: Sometimes true  Transportation Needs: No Transportation Needs (11/24/2018)   PRAPARE - Hydrologist (Medical): No    Lack of Transportation (Non-Medical): No  Physical Activity: Inactive (11/24/2018)   Exercise Vital Sign    Days of Exercise per Week: 0 days    Minutes of Exercise per Session: 0 min  Stress: Stress Concern Present (11/24/2018)   [place]    Feeling of Stress : Very much  Social Connections: Socially Integrated (11/24/2018)   Social Connection and Isolation Panel [NHANES]    Frequency of Communication with Friends and Family: Twice a week    Frequency of Social Gatherings with Friends and Family: Once a week    Attends Religious Services: 1 to 4 times per year    Active Member of [employer] or Organizations: Yes    Attends Archivist Meetings: Not on file    Marital Status: Living with partner    Allergies:  Allergies  Allergen Reactions    Latex Hives, Shortness Of Breath and Rash   Levofloxacin Shortness Of Breath and Rash   Moxifloxacin Shortness Of Breath and Rash   Oxycodone-Acetaminophen Shortness Of Breath, Swelling and Rash    NORCO/VICODIN OK   Peach [Prunus Persica] Hives and Shortness Of Breath   Potassium-Containing Compounds Other (See Comments)    IV ROUTE - CAUSES VEINS TO COLLAPS; Reports that it is undiluted K only   Prednisone Other (See Comments)    SEVERE ELEVATION OF BLOOD SUGAR. Able to tolerate 40 mg   Propoxyphene N-Acetaminophen Swelling    SWELLING OF FACE AND THROAT   Rosiglitazone Maleate Swelling    SWELLING OF FACE AND LEGS   Xolair [Omalizumab] Other (See Comments)    Rash and anaphylaxis   Adhesive [Tape] Hives, Itching and Rash   Morphine And Related Other (See Comments)    Causes hallucinations   Prozac [Fluoxetine Hcl] Other (See Comments)    Made her very aggressive    Chantix [Varenicline]     dreams   Citrullus Vulgaris Nausea And Vomiting    Facial swelling   Humira [Adalimumab]     Does not remember    Septra [Sulfamethoxazole-Trimethoprim]    Zyvox [Linezolid]     Edema    Cefaclor Rash   Keflex [Cephalexin] Diarrhea and Rash    REACTION: severe migraine   Promethazine Hcl Other (See Comments)    IV ROUTE ONLY - JITTERY FEELING. Patient reports that it is mild and she has used promethazine since then  PO tablet ok   Sulfadiazine Rash    Metabolic Disorder Labs: Lab Results  Component Value Date   HGBA1C 6.9 (H) 05/30/2021   MPG 151.33 05/30/2021   MPG 280.48 12/25/2017   No results found for: "PROLACTIN" Lab Results  Component Value Date   CHOL 193 03/22/2018   TRIG 90.0 03/22/2018   HDL 62.80 03/22/2018   CHOLHDL 3 03/22/2018  VLDL 18.0 03/22/2018   LDLCALC 113 (H) 03/22/2018   LDLCALC 88 12/19/2015   Lab Results  Component Value Date   TSH 1.43 03/22/2018   TSH 0.773 12/25/2017    Therapeutic Level Labs: No results found for: "LITHIUM" No  results found for: "VALPROATE" No results found for: "CBMZ"  Current Medications: Current Outpatient Medications  Medication Sig Dispense Refill   albuterol (PROVENTIL) (2.5 MG/3ML) 0.083% nebulizer solution Take 3 mLs (2.5 mg total) by nebulization every 6 (six) hours as needed for wheezing or shortness of breath. 75 mL 12   BAQSIMI TWO PACK 3 MG/DOSE POWD Place 3 mg into both nostrils daily as needed. Low CBG     cariprazine (VRAYLAR) 1.5 MG capsule Take 1 capsule (1.5 mg total) by mouth daily. 15 capsule 1   clonazePAM (KLONOPIN) 0.5 MG tablet Take 1 tablet (0.5 mg total) by mouth 2 (two) times daily as needed for anxiety. 5 tablet 1   glucose blood ([employer] TEST STRIPS) test strip 1 each by Other route See admin instructions.      HUMALOG 100 UNIT/ML injection 20-22 units pre-meal as per sliding scale (Patient taking differently: 150 Units every 3 (three) days. Using Omnipod pump) 10 mL 11   Prenatal Vit-Fe Fumarate-FA (PRENATAL MULTIVITAMIN) TABS tablet Take 1 tablet by mouth daily at 12 noon.     Vilazodone HCl (VIIBRYD) 20 MG TABS Take 2 tablets (40 mg total) by mouth daily. 60 tablet 1   No current facility-administered medications for this visit.      Psychiatric Specialty Exam: please take into account limitations in obtaining a full MSE in the context of phone communication ROS - does not endorse    unknown if currently breastfeeding.There is no height or weight on file to calculate BMI.  General Appearance: NA  Eye Contact:  NA  Speech:  Normal - no pressured speech   Volume:  Normal  Mood: reports mood has improved, currently does not endorse depression and presents euthymic   Affect:  appropriate , full range ( not expansive or irritable)   Thought Process:  Goal Directed and Linear  Orientation:  Full (Time, Place, and Person)  Thought Content: denies hallucinations, no delusions expressed, presents future oriented .   Suicidal Thoughts:  No-denies any suicidal or self  injurious ideations   Homicidal Thoughts:  No- no HI   Memory:  Recent and remote grossly intact   Judgement:  Present   Insight:  Present   Psychomotor Activity:  NA  Concentration:  Grossly intact    Recall:  Good  Fund of Knowledge: Good  Language: Good  Akathisia:  Negative  Handed:  Right  AIMS (if indicated): not done  Assets:  Communication Skills Desire for Improvement Financial Resources/Insurance Housing Social Support Talents/Skills  ADL's:  Intact  Cognition: WNL  Sleep:  Fair   Screenings: GAD-7    [place] Office Visit from 04/04/2019 in [facility] Counselor from 12/14/2018 in [facility] Counselor from 11/24/2018 in [facility]  Total GAD-7 Score '15 17 19      '$ PHQ2-9    Flowsheet Row Counselor from 10/07/2021 in [facility] Office Visit from 04/04/2019 in [facility] Counselor from 12/14/2018 in [facility] Counselor from 11/24/2018 in [facility] Patient Outreach from 03/14/2015 in [facility]  PHQ-2 Total Score '5 4 4 6 '$ 1  PHQ-9 Total Score '20 16 19 24 '$ --      Flowsheet Row ED from 06/23/2022 in [facility] Counselor from 10/07/2021 in [facility] Admission (Discharged) from 05/30/2021 in [facility]  C-SSRS RISK CATEGORY No Risk No Risk No Risk        Assessment and Plan: 41 yo female with hx of PTSD, bipolar 2 disorder and GAD/panic disorder.    She reports she is doing well in her daily activities, including home life and work.  Describes mood as  stable and presents euthymic, with a full range of affect . Presents future oriented, for example , reports currently considering other job opportunities that may pay more or be  closer to her home. No anhedonia or significant neuro-vegetative symptoms reported    Tolerating medications well, denies side effects and has tolerated Vraylar trial well thus far .   Of note, she reports anxiety symptoms have improved  and she reports she has not needed to take klonopin in several weeks Reports takes Klonopin only occasionally and has not taken any over the last few weeks .   History of DM, for which she is following with her endocrinologist .   Side effects reviewed, possible drug drug interactions reviewed  .     Dx: Bipolar 2 disorder; Anxiety/ GAD    Viibryd 40 mgr QDAY for depression, anxiety  Vraylar 1.5 mgr QDAY for mood disorder  Klonopin 0.5 mgr Q 12 hours PRN for anxiety -reports has not needed to take over recent weeks. Will discontinue . Will see in about 4-6 weeks She agrees to contact clinic sooner if any worsening prior  She also has crisis hotline number available if needed     Jenne Campus, MD 09/02/2022, 2:39 PM   Patient ID: Vernon Prey, female   DOB: 1981-01-28, 41 y.o.   MRN: [id number]

## 2022-09-16 NOTE — H&P (Signed)
[name] is an 41 y.o. female G2P2 who presents for hysteroscopy/possible myomectomy/curettage and placement of Mirena IUD for menorrhagia. She reports she has had a menses every 15-18 days for about 6 months. The cycles last 7 days for first cycle of month, then 3-5 days for second cycle. She has a h/o DVT so has limited options for medical treatment and would like an IUD if able.  EMB benign 9/23 but SIUS showed a 0.5 cm calcified intracavitary lesion so we will proceed with removal of that and then place IUD. She has had a prior tubal ligation  Pertinent Gynecological History:  OB History: NSVD x 1                    C/S x 1   Menstrual History:  No LMP recorded. (Menstrual status: Other).    Past Medical History:  Diagnosis Date   Abscess of left axilla - PREVOTELLA BIVIA & Staph Coag Neg 07/12/2012   MODERATE PREVOTELLA BIVIA Note: BETA LACTAMASE NEGATIVE     Asthma    as a child   Cancer ([place])    skin tag rt breast   Cellulitis 06/01/2014   RT INNER THIGH   Depression    hx   Diabetes ([place])    Diabetes mellitus    IDDM, Insulin pump; followed by Dr. [name]   DVT (deep vein thrombosis) in pregnancy    GERD (gastroesophageal reflux disease)    no current meds.   Heart murmur    Hidradenitis 04/2012   bilat. thighs, left groin - open areas on thighs   Hx MRSA infection    Hx of blood clots    in left leg   Hydradenitis    PCOS (polycystic ovarian syndrome)    Pneumonia    hx   SVT (supraventricular tachycardia) 06/2017   TIA (transient ischemic attack) 2019   Vitamin D insufficiency     Past Surgical History:  Procedure Laterality Date   BREAST EXCISIONAL BIOPSY Right    BREAST SURGERY     right lumpectomy   BUNIONECTOMY     left   CARDIAC CATHETERIZATION     CESAREAN SECTION N/A 06/02/2021   Procedure: CESAREAN SECTION;  Surgeon: Paula Compton, MD;  Location: [facility]S;  Service: Obstetrics;  Laterality: N/A;   CHOLECYSTECTOMY  12/02/2006    lap. chole.   DILATION AND EVACUATION  02/14/2007   ESOPHAGOGASTRODUODENOSCOPY  11/17/2011   Procedure: ESOPHAGOGASTRODUODENOSCOPY (EGD);  Surgeon: [name], MD;  Location: [facility];  Service: Endoscopy;  Laterality: N/A;   EYE SURGERY     exc. stye left eye   HYDRADENITIS EXCISION  04/30/2012   Procedure: EXCISION HYDRADENITIS GROIN;  Surgeon: [name], MD;  Location: [place];  Service: General;  Laterality: Left;  wide excision hidradenitis bilateral thighs and Left groin   HYDRADENITIS EXCISION Left 01/13/2014   Procedure: WIDE EXCISION HIDRADENITIS LEFT AXILLA;  Surgeon: [name], MD;  Location: [place];  Service: General;  Laterality: Left;   INCISION AND DRAINAGE ABSCESS Right 03/17/2019   Procedure: INCISION AND DRAINAGE RIGHT THIGH ABSCESS;  Surgeon: [name], MD;  Location: [place];  Service: General;  Laterality: Right;   IRRIGATION AND DEBRIDEMENT ABSCESS Right 06/02/2014   Procedure: IRRIGATION AND DEBRIDEMENT ABSCESS;  Surgeon: [name], MD;  Location: [place];  Service: General;  Laterality: Right;   IRRIGATION AND DEBRIDEMENT ABSCESS Left 09/23/2014   Procedure: IRRIGATION AND DEBRIDEMENT ABSCESS/LEFT  THIGH;  Surgeon: [name], MD;  Location: [place];  Service: General;  Laterality: Left;   LEFT HEART CATHETERIZATION WITH CORONARY ANGIOGRAM N/A 07/14/2014   Procedure: LEFT HEART CATHETERIZATION WITH CORONARY ANGIOGRAM;  Surgeon: [name], MD;  Location: [facility] CATH LAB;  Service: Cardiovascular;  Laterality: N/A;   TONSILLECTOMY     WISDOM TOOTH EXTRACTION      Family History  Problem Relation Age of Onset   Cancer Mother    Anxiety disorder Mother    Depression Mother    Sexual abuse Mother    Breast cancer Mother 47   Hyperlipidemia Sister    Hypertension Sister    Sexual abuse Sister    Hypertension Father    Anxiety disorder Father    Depression Father    Drug abuse Father    Stroke Paternal Uncle     ADD / ADHD Paternal Uncle    Anxiety disorder Paternal Uncle    Anxiety disorder Maternal Aunt    Seizures Maternal Aunt    Anxiety disorder Paternal Aunt    Anxiety disorder Maternal Uncle    ADD / ADHD Cousin    ADD / ADHD Daughter     Social History:  reports that she quit smoking about 1 years ago. Her smoking use included cigarettes. She has a 23.00 pack-year smoking history. She has never used smokeless tobacco. She reports that she does not currently use drugs after having used the following drugs: Marijuana. She reports that she does not drink alcohol.  Allergies:  Allergies  Allergen Reactions   Latex Hives, Shortness Of Breath and Rash   Levofloxacin Shortness Of Breath and Rash   Moxifloxacin Shortness Of Breath and Rash   Oxycodone-Acetaminophen Shortness Of Breath, Swelling and Rash    NORCO/VICODIN OK   Peach [Prunus Persica] Hives and Shortness Of Breath   Potassium-Containing Compounds Other (See Comments)    IV ROUTE - CAUSES VEINS TO COLLAPS; Reports that it is undiluted K only   Prednisone Other (See Comments)    SEVERE ELEVATION OF BLOOD SUGAR. Able to tolerate 40 mg   Propoxyphene N-Acetaminophen Swelling    SWELLING OF FACE AND THROAT   Rosiglitazone Maleate Swelling    SWELLING OF FACE AND LEGS   Xolair [Omalizumab] Other (See Comments)    Rash and anaphylaxis   Adhesive [Tape] Hives, Itching and Rash   Morphine And Related Other (See Comments)    Causes hallucinations   Prozac [Fluoxetine Hcl] Other (See Comments)    Made her very aggressive    Chantix [Varenicline]     dreams   Citrullus Vulgaris Nausea And Vomiting    Facial swelling   Humira [Adalimumab]     Does not remember    Septra [Sulfamethoxazole-Trimethoprim]    Zyvox [Linezolid]     Edema    Cefaclor Rash   Keflex [Cephalexin] Diarrhea and Rash    REACTION: severe migraine   Promethazine Hcl Other (See Comments)    IV ROUTE ONLY - JITTERY FEELING. Patient reports that it is mild and  she has used promethazine since then  PO tablet ok   Sulfadiazine Rash    No medications prior to admission.    Review of Systems  Constitutional:  Negative for fever.  Genitourinary:  Positive for menstrual problem. Negative for vaginal bleeding and vaginal pain.    unknown if currently breastfeeding. Physical Exam Constitutional:      Appearance: She is normal weight.  Cardiovascular:     Rate  and Rhythm: Normal rate and regular rhythm.  Pulmonary:     Effort: Pulmonary effort is normal.  Abdominal:     Palpations: Abdomen is soft.  Genitourinary:    General: Normal vulva.  Skin:    General: Skin is warm.  Neurological:     General: No focal deficit present.     Mental Status: She is alert.     No results found for this or any previous visit (from the past 24 hour(s)).  No results found.  Assessment/Plan: The patient was counseled regarding the hysteroscopy procedure in detail.  We reviewed risks of bleeding and infection and possible uterine perforation.  We also discussed the removal of any identified polyps or submucosal fibroids and what that would involve.  The patient desires to proceed.  She will use cytotec 468mg 3 hours prior to the procedure to aid with cervical dilation.  We will place a Mirena IUD at the end of the procedure for cycle control.   KLogan Bores12/26/2023, 9:09 AM

## 2022-09-18 ENCOUNTER — Encounter (HOSPITAL_BASED_OUTPATIENT_CLINIC_OR_DEPARTMENT_OTHER): Payer: Self-pay | Admitting: Obstetrics and Gynecology

## 2022-09-18 ENCOUNTER — Telehealth (HOSPITAL_BASED_OUTPATIENT_CLINIC_OR_DEPARTMENT_OTHER): Payer: Medicaid Other | Admitting: Psychiatry

## 2022-09-18 DIAGNOSIS — F3181 Bipolar II disorder: Secondary | ICD-10-CM

## 2022-09-18 NOTE — Progress Notes (Signed)
09/18/2022 phone documentation   Contacted patient via phone for scheduled medication management appointment.  [name] states she is at work currently and is having a very busy day due to which she requests to reschedule this appointment. She states that she has been doing "all right" and describes her mood as stable.  Denies SI.   Denies medication side effects . She also reports she has upcoming elective surgery tomorrow.   Will ask front desk to reschedule appointment for January.  [name] has phone number for clinic and for crisis hotline if needed, agrees to contact clinic if any concerns prior to appointment    Jenne Campus, MD 09/18/2022, 2:07 PM   Patient ID: [name], female   DOB: August 30, 1981, 41 y.o.   MRN: [id number]

## 2022-09-18 NOTE — Progress Notes (Signed)
Spoke w/ via phone for pre-op interview--- pt Lab needs dos---- cbc, cmp, urine preg (EKG ask mda if needed)              Lab results------ EKG without tracing in care everywhere 07-08-2022 COVID test -----patient states asymptomatic no test needed Arrive at -------  0530 on 09-19-2022 NPO after MN NO Solid Food.  Clear liquids from MN until--- 0430 Med rec completed Medications to take morning of surgery ----- viibryd, protonix Diabetic medication ----- pt will leave insulin pump on Patient instructed no nail polish to be worn day of surgery Patient instructed to bring photo id and insurance card day of surgery Patient aware to have Driver (ride ) / caregiver    for 24 hours after surgery -- mother, sue Patient Special Instructions ----- asked pt to bring rescue inhaler dos and insulin pump supplies Pre-Op special Istructions -----  pt has dexcom ,  stated will be changing tonight Patient verbalized understanding of instructions that were given at this phone interview. Patient denies shortness of breath, chest pain, fever, cough at this phone interview.

## 2022-09-19 ENCOUNTER — Other Ambulatory Visit: Payer: Self-pay

## 2022-09-19 ENCOUNTER — Encounter (HOSPITAL_BASED_OUTPATIENT_CLINIC_OR_DEPARTMENT_OTHER)
Admission: RE | Disposition: A | Discharge status: Home / Self Care | Payer: Self-pay | Source: Home / Self Care | Attending: Obstetrics and Gynecology

## 2022-09-19 ENCOUNTER — Encounter (HOSPITAL_BASED_OUTPATIENT_CLINIC_OR_DEPARTMENT_OTHER): Payer: Self-pay | Admitting: Obstetrics and Gynecology

## 2022-09-19 ENCOUNTER — Ambulatory Visit (HOSPITAL_BASED_OUTPATIENT_CLINIC_OR_DEPARTMENT_OTHER): Payer: Medicaid Other | Admitting: Anesthesiology

## 2022-09-19 ENCOUNTER — Ambulatory Visit (HOSPITAL_BASED_OUTPATIENT_CLINIC_OR_DEPARTMENT_OTHER)
Admission: RE | Admit: 2022-09-19 | Discharge: 2022-09-19 | Disposition: A | Discharge status: Home / Self Care | Payer: Medicaid Other | Attending: Obstetrics and Gynecology | Admitting: Obstetrics and Gynecology

## 2022-09-19 DIAGNOSIS — E119 Type 2 diabetes mellitus without complications: Secondary | ICD-10-CM | POA: Diagnosis not present

## 2022-09-19 DIAGNOSIS — N939 Abnormal uterine and vaginal bleeding, unspecified: Secondary | ICD-10-CM | POA: Insufficient documentation

## 2022-09-19 DIAGNOSIS — Z8673 Personal history of transient ischemic attack (TIA), and cerebral infarction without residual deficits: Secondary | ICD-10-CM | POA: Insufficient documentation

## 2022-09-19 DIAGNOSIS — Z9851 Tubal ligation status: Secondary | ICD-10-CM | POA: Insufficient documentation

## 2022-09-19 DIAGNOSIS — F17210 Nicotine dependence, cigarettes, uncomplicated: Secondary | ICD-10-CM

## 2022-09-19 DIAGNOSIS — I10 Essential (primary) hypertension: Secondary | ICD-10-CM

## 2022-09-19 DIAGNOSIS — Z86718 Personal history of other venous thrombosis and embolism: Secondary | ICD-10-CM | POA: Insufficient documentation

## 2022-09-19 DIAGNOSIS — Z87891 Personal history of nicotine dependence: Secondary | ICD-10-CM | POA: Diagnosis not present

## 2022-09-19 DIAGNOSIS — Z79899 Other long term (current) drug therapy: Secondary | ICD-10-CM | POA: Diagnosis not present

## 2022-09-19 DIAGNOSIS — Z01818 Encounter for other preprocedural examination: Secondary | ICD-10-CM

## 2022-09-19 DIAGNOSIS — J45909 Unspecified asthma, uncomplicated: Secondary | ICD-10-CM

## 2022-09-19 DIAGNOSIS — N920 Excessive and frequent menstruation with regular cycle: Secondary | ICD-10-CM

## 2022-09-19 HISTORY — DX: Hidradenitis suppurativa: L73.2

## 2022-09-19 HISTORY — DX: Excessive and frequent menstruation with regular cycle: N92.0

## 2022-09-19 HISTORY — DX: Personal history of cervical dysplasia: Z87.410

## 2022-09-19 HISTORY — DX: Major depressive disorder, single episode, unspecified: F32.9

## 2022-09-19 HISTORY — DX: Presence of insulin pump (external) (internal): Z96.41

## 2022-09-19 HISTORY — DX: Personal history of other complications of pregnancy, childbirth and the puerperium: Z87.59

## 2022-09-19 HISTORY — DX: Personal history of colonic polyps: Z86.010

## 2022-09-19 HISTORY — PX: HYSTEROSCOPY WITH D & C: SHX1775

## 2022-09-19 HISTORY — DX: Personal history of other endocrine, nutritional and metabolic disease: Z86.39

## 2022-09-19 HISTORY — DX: Bipolar II disorder: F31.81

## 2022-09-19 HISTORY — DX: Post-traumatic stress disorder, chronic: F43.12

## 2022-09-19 HISTORY — DX: Personal history of other venous thrombosis and embolism: Z86.718

## 2022-09-19 HISTORY — DX: Fibromyalgia: M79.7

## 2022-09-19 HISTORY — PX: INTRAUTERINE DEVICE (IUD) INSERTION: SHX5877

## 2022-09-19 HISTORY — DX: Generalized anxiety disorder: F41.1

## 2022-09-19 HISTORY — DX: Panic disorder (episodic paroxysmal anxiety): F41.0

## 2022-09-19 HISTORY — DX: Personal history of adenomatous and serrated colon polyps: Z86.0101

## 2022-09-19 LAB — COMPREHENSIVE METABOLIC PANEL
ALT: 11 U/L (ref 0–44)
AST: 14 U/L — ABNORMAL LOW (ref 15–41)
Albumin: 4.1 g/dL (ref 3.5–5.0)
Alkaline Phosphatase: 84 U/L (ref 38–126)
Anion gap: 8 (ref 5–15)
BUN: 19 mg/dL (ref 6–20)
CO2: 20 mmol/L — ABNORMAL LOW (ref 22–32)
Calcium: 9.3 mg/dL (ref 8.9–10.3)
Chloride: 111 mmol/L (ref 98–111)
Creatinine, Ser: 0.65 mg/dL (ref 0.44–1.00)
GFR, Estimated: >60 mL/min (ref >60)
Glucose, Bld: 166 mg/dL — ABNORMAL HIGH (ref 70–99)
Potassium: 3.7 mmol/L (ref 3.5–5.1)
Sodium: 139 mmol/L (ref 135–145)
Total Bilirubin: 0.5 mg/dL (ref 0.3–1.2)
Total Protein: 7.8 g/dL (ref 6.5–8.1)

## 2022-09-19 LAB — GLUCOSE, CAPILLARY: Glucose-Capillary: 177 mg/dL — ABNORMAL HIGH (ref 70–99)

## 2022-09-19 LAB — CBC
HCT: 42.6 % (ref 36.0–46.0)
Hemoglobin: 14.4 g/dL (ref 12.0–15.0)
MCH: 30.9 pg (ref 26.0–34.0)
MCHC: 33.8 g/dL (ref 30.0–36.0)
MCV: 91.4 fL (ref 80.0–100.0)
Platelets: 362 10*3/uL (ref 150–400)
RBC: 4.66 MIL/uL (ref 3.87–5.11)
RDW: 12.6 % (ref 11.5–15.5)
WBC: 8 10*3/uL (ref 4.0–10.5)
nRBC: 0 % (ref 0.0–0.2)

## 2022-09-19 LAB — POCT PREGNANCY, URINE: Preg Test, Ur: NEGATIVE

## 2022-09-19 SURGERY — DILATATION AND CURETTAGE /HYSTEROSCOPY
Anesthesia: General | Site: Uterus

## 2022-09-19 MED ORDER — MEPERIDINE HCL 25 MG/ML IJ SOLN: 6.2500 mg | INTRAMUSCULAR | Status: DC | PRN

## 2022-09-19 MED ORDER — MIDAZOLAM HCL 2 MG/2ML IJ SOLN
INTRAMUSCULAR | Status: AC
  Filled 2022-09-19: qty 2

## 2022-09-19 MED ORDER — DEXAMETHASONE SODIUM PHOSPHATE 10 MG/ML IJ SOLN
INTRAMUSCULAR | Status: DC | PRN
  Administered 2022-09-19: 4 mg via INTRAVENOUS

## 2022-09-19 MED ORDER — ONDANSETRON HCL 4 MG/2ML IJ SOLN
INTRAMUSCULAR | Status: DC | PRN
  Administered 2022-09-19: 4 mg via INTRAVENOUS

## 2022-09-19 MED ORDER — LIDOCAINE 2% (20 MG/ML) 5 ML SYRINGE
INTRAMUSCULAR | Status: DC | PRN
  Administered 2022-09-19: 50 mg via INTRAVENOUS

## 2022-09-19 MED ORDER — FENTANYL CITRATE (PF) 250 MCG/5ML IJ SOLN
INTRAMUSCULAR | Status: DC | PRN
  Administered 2022-09-19: 25 ug via INTRAVENOUS
  Administered 2022-09-19: 50 ug via INTRAVENOUS
  Administered 2022-09-19: 25 ug via INTRAVENOUS

## 2022-09-19 MED ORDER — ACETAMINOPHEN 500 MG PO TABS
1000.0000 mg | ORAL_TABLET | Freq: Once | ORAL | Status: AC
  Administered 2022-09-19: 1000 mg via ORAL

## 2022-09-19 MED ORDER — PROPOFOL 10 MG/ML IV BOLUS
INTRAVENOUS | Status: DC | PRN
  Administered 2022-09-19: 200 mg via INTRAVENOUS

## 2022-09-19 MED ORDER — LEVONORGESTREL 20 MCG/DAY IU IUD
1.0000 | INTRAUTERINE_SYSTEM | INTRAUTERINE | Status: AC
  Administered 2022-09-19: 1 via INTRAUTERINE

## 2022-09-19 MED ORDER — IBUPROFEN 200 MG PO TABS: 600.0000 mg | ORAL_TABLET | Freq: Four times a day (QID) | ORAL | 0 refills | Status: DC | PRN

## 2022-09-19 MED ORDER — SODIUM CHLORIDE 0.9 % IV SOLN: INTRAVENOUS | Status: DC

## 2022-09-19 MED ORDER — KETOROLAC TROMETHAMINE 30 MG/ML IJ SOLN
INTRAMUSCULAR | Status: AC
  Filled 2022-09-19: qty 2

## 2022-09-19 MED ORDER — PROPOFOL 10 MG/ML IV BOLUS
INTRAVENOUS | Status: AC
  Filled 2022-09-19: qty 20

## 2022-09-19 MED ORDER — AMISULPRIDE (ANTIEMETIC) 5 MG/2ML IV SOLN: 10.0000 mg | Freq: Once | INTRAVENOUS | Status: DC | PRN

## 2022-09-19 MED ORDER — KETOROLAC TROMETHAMINE 30 MG/ML IJ SOLN
INTRAMUSCULAR | Status: DC | PRN
  Administered 2022-09-19: 30 mg via INTRAVENOUS

## 2022-09-19 MED ORDER — SODIUM CHLORIDE 0.9 % IR SOLN
Status: DC | PRN
  Administered 2022-09-19: 3000 mL

## 2022-09-19 MED ORDER — LIDOCAINE HCL 1 % IJ SOLN
INTRAMUSCULAR | Status: DC | PRN
  Administered 2022-09-19: 20 mL

## 2022-09-19 MED ORDER — DEXMEDETOMIDINE HCL IN NACL 80 MCG/20ML IV SOLN
INTRAVENOUS | Status: DC | PRN
  Administered 2022-09-19: 12 ug via BUCCAL

## 2022-09-19 MED ORDER — FENTANYL CITRATE (PF) 100 MCG/2ML IJ SOLN
INTRAMUSCULAR | Status: AC
  Filled 2022-09-19: qty 2

## 2022-09-19 MED ORDER — POVIDONE-IODINE 10 % EX SWAB: 2.0000 | Freq: Once | CUTANEOUS | Status: DC

## 2022-09-19 MED ORDER — ACETAMINOPHEN 500 MG PO TABS
ORAL_TABLET | ORAL | Status: AC
  Filled 2022-09-19: qty 2

## 2022-09-19 MED ORDER — MIDAZOLAM HCL 2 MG/2ML IJ SOLN
INTRAMUSCULAR | Status: DC | PRN
  Administered 2022-09-19: 2 mg via INTRAVENOUS

## 2022-09-19 MED ORDER — DIPHENHYDRAMINE HCL 50 MG/ML IJ SOLN
INTRAMUSCULAR | Status: AC
  Filled 2022-09-19: qty 1

## 2022-09-19 MED ORDER — HYDROMORPHONE HCL 1 MG/ML IJ SOLN: 0.2500 mg | INTRAMUSCULAR | Status: DC | PRN

## 2022-09-19 SURGICAL SUPPLY — 19 items
ABLATOR SURESOUND NOVASURE (ABLATOR) IMPLANT
CATH ROBINSON RED A/P 16FR (CATHETERS) ×3 IMPLANT
DEVICE MYOSURE LITE (MISCELLANEOUS) IMPLANT
DRSG TELFA 3X8 NADH STRL (GAUZE/BANDAGES/DRESSINGS) ×3 IMPLANT
ELECT REM PT RETURN 9FT ADLT (ELECTROSURGICAL)
ELECTRODE REM PT RTRN 9FT ADLT (ELECTROSURGICAL) IMPLANT
GAUZE 4X4 16PLY ~~LOC~~+RFID DBL (SPONGE) ×3 IMPLANT
GLOVE BIO SURGEON STRL SZ 6.5 (GLOVE) ×3 IMPLANT
GLOVE BIO SURGEON STRL SZ7 (GLOVE) ×3 IMPLANT
GLOVE SURG SS PI 6.5 STRL IVOR (GLOVE) IMPLANT
GOWN STRL REUS W/TWL LRG LVL3 (GOWN DISPOSABLE) ×9 IMPLANT
KIT PROCEDURE FLUENT (KITS) ×3 IMPLANT
KIT TURNOVER CYSTO (KITS) ×3 IMPLANT
LOOP CUTTING BIPOLAR 21FR (ELECTRODE) IMPLANT
MIRENA IUD IMPLANT
PACK VAGINAL MINOR WOMEN LF (CUSTOM PROCEDURE TRAY) ×3 IMPLANT
PAD OB MATERNITY 4.3X12.25 (PERSONAL CARE ITEMS) ×3 IMPLANT
SEAL ROD LENS SCOPE MYOSURE (ABLATOR) ×3 IMPLANT
TOWEL OR 17X26 10 PK STRL BLUE (TOWEL DISPOSABLE) ×3 IMPLANT

## 2022-09-19 NOTE — Anesthesia Preprocedure Evaluation (Signed)
Anesthesia Evaluation  Patient identified by MRN, date of birth, ID band Patient awake    Reviewed: Allergy & Precautions, H&P , NPO status , Patient's Chart, lab work & pertinent test results, reviewed documented beta blocker date and time   Airway Mallampati: II  TM Distance: >3 FB Neck ROM: full    Dental no notable dental hx.    Pulmonary asthma , pneumonia, former smoker   Pulmonary exam normal breath sounds clear to auscultation       Cardiovascular hypertension, Pt. on medications Normal cardiovascular exam Rhythm:regular Rate:Normal     Neuro/Psych  Headaches PSYCHIATRIC DISORDERS Anxiety Depression Bipolar Disorder   TIA   GI/Hepatic Neg liver ROS,GERD  ,,  Endo/Other  diabetes    Renal/GU negative Renal ROS  negative genitourinary   Musculoskeletal  (+)  Fibromyalgia -  Abdominal  (+) + obese  Peds  Hematology negative hematology ROS (+)   Anesthesia Other Findings   Reproductive/Obstetrics                             Anesthesia Physical Anesthesia Plan  ASA: 3  Anesthesia Plan: General   Post-op Pain Management: Dilaudid IV   Induction: Intravenous  PONV Risk Score and Plan: 3 and Treatment may vary due to age or medical condition, Ondansetron, Dexamethasone and Midazolam  Airway Management Planned: LMA  Additional Equipment: None  Intra-op Plan:   Post-operative Plan: Extubation in OR  Informed Consent: I have reviewed the patients History and Physical, chart, labs and discussed the procedure including the risks, benefits and alternatives for the proposed anesthesia with the patient or authorized representative who has indicated his/her understanding and acceptance.       Plan Discussed with: Anesthesiologist  Anesthesia Plan Comments:         Anesthesia Quick Evaluation

## 2022-09-19 NOTE — Interval H&P Note (Signed)
History and Physical Interval Note:  09/19/2022 7:11 AM  [name]  has presented today for surgery, with the diagnosis of abnormal uterine bleeding.  The various methods of treatment have been discussed with the patient and family. After consideration of risks, benefits and other options for treatment, the patient has consented to  Procedure(s) with comments: DILATATION AND CURETTAGE /HYSTEROSCOPY (N/A) INTRAUTERINE DEVICE (IUD) INSERTION (N/A) - Mirena as a surgical intervention.  The patient's history has been reviewed, patient examined, no change in status, stable for surgery.  FBS 160-170 and has on CGM and pump.  I have reviewed the patient's chart and labs.  Questions were answered to the patient's satisfaction.     Logan Bores

## 2022-09-19 NOTE — Discharge Instructions (Addendum)
  Post Anesthesia Home Care Instructions  Activity: Get plenty of rest for the remainder of the day. A responsible individual must stay with you for 24 hours following the procedure.  For the next 24 hours, DO NOT: -Drive a car -Paediatric nurse -Drink alcoholic beverages -Take any medication unless instructed by your physician -Make any legal decisions or sign important papers.  Meals: Start with liquid foods such as gelatin or soup. Progress to regular foods as tolerated. Avoid greasy, spicy, heavy foods. If nausea and/or vomiting occur, drink only clear liquids until the nausea and/or vomiting subsides. Call your physician if vomiting continues.  Special Instructions/Symptoms: Your throat may feel dry or sore from the anesthesia or the breathing tube placed in your throat during surgery. If this causes discomfort, gargle with warm salt water. The discomfort should disappear within 24 hours.  DISCHARGE INSTRUCTIONS: HYSTEROSCOPY  The following instructions have been prepared to help you care for yourself upon your return home  May take Ibuprofen after  May take stool softner while taking narcotic pain medication to prevent constipation.  Drink plenty of water.  Personal hygiene:  Use sanitary pads for vaginal drainage, not tampons.  Shower the day after your procedure.  NO tub baths, pools or Jacuzzis for 2-3 weeks.  Wipe front to back after using the bathroom.  Activity and limitations:  Do NOT drive or operate any equipment for 24 hours. The effects of anesthesia are still present and drowsiness may result.  Do NOT rest in bed all day.  Walking is encouraged.  Walk up and down stairs slowly.  You may resume your normal activity in one to two days or as indicated by your physician. Sexual activity: NO intercourse for at least 2 weeks after the procedure, or as indicated by your Doctor.  Diet: Eat a light meal as desired this evening. You may resume your usual diet  tomorrow.  Return to Work: You may resume your work activities in one to two days or as indicated by Marine scientist.  What to expect after your surgery: Expect to have vaginal bleeding/discharge for 2-3 days and spotting for up to 10 days. It is not unusual to have soreness for up to 1-2 weeks. You may have a slight burning sensation when you urinate for the first day. Mild cramps may continue for a couple of days. You may have a regular period in 2-6 weeks.  Call your doctor for any of the following:  Excessive vaginal bleeding or clotting, saturating and changing one pad every hour.  Inability to urinate 6 hours after discharge from hospital.  Pain not relieved by pain medication.  Fever of 100.4 F or greater.  Unusual vaginal discharge or odor.  May take tylenol after 3 PM for soreness.

## 2022-09-19 NOTE — Anesthesia Postprocedure Evaluation (Signed)
Anesthesia Post Note  Patient: [name]  Procedure(s) Performed: DILATATION AND CURETTAGE /HYSTEROSCOPY WITH MYOSURE (Uterus) INTRAUTERINE DEVICE (IUD) INSERTION (Cervix)     Patient location during evaluation: PACU Anesthesia Type: General Level of consciousness: awake and alert Pain management: pain level controlled Vital Signs Assessment: post-procedure vital signs reviewed and stable Respiratory status: spontaneous breathing, nonlabored ventilation and respiratory function stable Cardiovascular status: blood pressure returned to baseline and stable Postop Assessment: no apparent nausea or vomiting Anesthetic complications: no   No notable events documented.  Last Vitals:  Vitals:   09/19/22 0830 09/19/22 0845  BP: 117/74 111/86  Pulse: 64 68  Resp: 13 18  Temp:    SpO2: 98% 96%    Last Pain:  Vitals:   09/19/22 0813  TempSrc:   PainSc: 0-No pain                 Lynda Rainwater

## 2022-09-19 NOTE — Anesthesia Procedure Notes (Signed)
Procedure Name: LMA Insertion Date/Time: 09/19/2022 7:39 AM  Performed by: Clearnce Sorrel, CRNAPre-anesthesia Checklist: Patient identified, Emergency Drugs available, Suction available and Patient being monitored Patient Re-evaluated:Patient Re-evaluated prior to induction Oxygen Delivery Method: Circle System Utilized Preoxygenation: Pre-oxygenation with 100% oxygen Induction Type: IV induction Ventilation: Mask ventilation without difficulty LMA: LMA inserted LMA Size: 4.0 Number of attempts: 1 Airway Equipment and Method: Bite block Placement Confirmation: positive ETCO2 Tube secured with: Tape Dental Injury: Teeth and Oropharynx as per pre-operative assessment

## 2022-09-19 NOTE — Op Note (Addendum)
Operative Note    Preoperative Diagnosis Abnormal uterine bleeding Possible submucosal fibroid   Postoperative Diagnosis Same with no obvious fibroid or polyp seen  Procedure Hysteroscopy with endometrial sampling and placement of Mirena IUD  Surgeon Paula Compton, MD  Anesthesia LMA  Fluids: EBL 33m UOP voided prior to procedure IVF 6020mLR Hysteroscopic deficit 12087mFindings The cavity appeared normal with no obvious submucosal fibroid or polyp seen, sampling done of all quadrants as well as posterior wall in area where calcification seen on ultrasound  Specimen Endometrial samplings  Procedure Note Patient was taken to the operating room where LMA anesthesia was obtained without difficulty. She was then prepped and draped in the normal sterile fashion in the dorsal lithotomy position. An appropriate time out was performed. A speculum was then placed within the vagina and the anterior lip of the cervix identified and injected with approximately 2 cc of 1% plain lidocaine. An additional 9 cc each was placed at 2 and 10:00 for a paracervical block. Uterus was then sounded to approximately 8-9 cm and the [facility] utilized to dilate the cervix up to approximately 21. The Myosure operating scope was then introduced into the cervix and the cavity inspected with findings as previously noted. The Myosure lite operating blade was then introduced through the scope and under direct visualization the endometrium was extensively sampled, including the area on the posterior wall where the small calcification was seen. There was no active bleeding at the conclusion of the sampling. The operating scope was then removed from the cervix a small curette inserted into the uterine fundus. A gentle curettage was performed in all 4 quadrants to sample any remaining endometrium. All tissue specimens were handed off to pathology. The Mirena IUD was then deployed in the cavity with no issue  and strings trimmed to about 2cm length. The tenaculum was then removed from the cervix and the site rendered hemostatic with silver nitrate. Finally the speculum was removed from the vagina and the patient awakened and taken to the recovery room in good condition.

## 2022-09-19 NOTE — Transfer of Care (Signed)
Immediate Anesthesia Transfer of Care Note  Patient: [name]  Procedure(s) Performed: DILATATION AND CURETTAGE /HYSTEROSCOPY WITH MYOSURE (Uterus) INTRAUTERINE DEVICE (IUD) INSERTION (Cervix)  Patient Location: PACU  Anesthesia Type:General  Level of Consciousness: sedated  Airway & Oxygen Therapy: Patient Spontanous Breathing and Patient connected to nasal cannula oxygen  Post-op Assessment: Report given to RN and Post -op Vital signs reviewed and stable  Post vital signs: Reviewed and stable  Last Vitals:  Vitals Value Taken Time  BP 134/87 09/19/22 0813  Temp    Pulse 76 09/19/22 0814  Resp 13 09/19/22 0814  SpO2 99 % 09/19/22 0814  Vitals shown include unvalidated device data.  Last Pain:  Vitals:   09/19/22 0609  TempSrc: Oral  PainSc: 4       Patients Stated Pain Goal: 4 (42/[id number])  Complications: No notable events documented.

## 2022-09-23 LAB — SURGICAL PATHOLOGY

## 2022-09-24 ENCOUNTER — Encounter (HOSPITAL_BASED_OUTPATIENT_CLINIC_OR_DEPARTMENT_OTHER): Payer: Self-pay | Admitting: Obstetrics and Gynecology

## 2022-09-29 ENCOUNTER — Encounter (HOSPITAL_COMMUNITY): Payer: Self-pay | Admitting: Psychiatry

## 2022-09-29 ENCOUNTER — Telehealth (HOSPITAL_BASED_OUTPATIENT_CLINIC_OR_DEPARTMENT_OTHER): Payer: Medicaid Other | Admitting: Psychiatry

## 2022-09-29 DIAGNOSIS — F3181 Bipolar II disorder: Secondary | ICD-10-CM

## 2022-09-29 MED ORDER — VILAZODONE HCL 20 MG PO TABS: 40.0000 mg | ORAL_TABLET | Freq: Every day | ORAL | 1 refills | Status: DC

## 2022-09-29 MED ORDER — CARIPRAZINE HCL 1.5 MG PO CAPS: 1.5000 mg | ORAL_CAPSULE | Freq: Every day | ORAL | 1 refills | Status: DC

## 2022-09-29 NOTE — Progress Notes (Signed)
[place] MD/PA/NP OP Progress Note  09/29/2022 10:04 AM [name]  MRN:  [id number]   Interview was conducted by phone and I verified that I was speaking with the correct person using two identifiers. I discussed the limitations of evaluation and management by telemedicine and  the availability of in person appointments.   Participants in the visit:  patient (location - home); physician (location - home office). Duration 20 minutes   Chief Complaint:  returns for medication management   HPI: 42 yo female with hx of PTSD, bipolar II disorder and GAD/panic disorder.    [name] reports she is doing well at present .  She describes functioning well in daily activities, including work (works at a [employer]) and home life.  She describes mood has been "all right" and does not endorse significant mood swings or affective lability.  She does not endorse depression and currently appears euthymic with an appropriate/reactive affect.  Does not endorse anhedonia.  Does not endorse significant neurovegetative symptoms.  No manic symptoms are currently either noted or endorsed.  She describes some ongoing stressors.  Reports she has been quite busy at work.  Some of her extended family members have been medically ill and are currently hospitalized.  Marital relationship, which had been a stressor last year, as described as improved/more stable.  She also reports her children are currently doing well.   She reports diabetes currently generally well-controlled.  Her hemoglobin A1c had been elevated on her last visit to her endocrinologist but states that at the time she had an upper respiratory infection and was on steroids.  She has an upcoming appointment with endocrinologist later this month to recheck.  Reports she checks glycemias daily and have been stable. She also reports a recent GU procedure ( ambulatory) went well .  Currently tolerating medications well.  She is on Viibryd at 40 mg  daily.  She reports Viibryd has been well-tolerated and "very helpful", has noted improved mood on it.  She recently started Vraylar at 1.5 mg daily for mood disorder/augmentation.  She denies having any side effects thus far.  Side effects were reviewed, including possible risk for motor/extraparametal/dyskinetic movements and of metabolic side effects/hypoglycemia.  We have reviewed termination issues- writer  is leaving clinic in March 2024 , after which patient may continue management with another [facility] clinician  . She expresses understanding .         Visit Diagnosis:  No diagnosis found.  Past Psychiatric History: Please see intake H&P.  Past Medical History:  Past Medical History:  Diagnosis Date   Asthma    followed by pcp   Bipolar 2 disorder ([place])    Chronic post-traumatic stress disorder (PTSD)    Factor V Leiden mutation ([place]) 04/2021   per pt had blood done by gyn dr Marvel Plan in her office  ;  only one episode DVT LLE 08/ 2022   Fibromyalgia    folllowed by pcp   GAD (generalized anxiety disorder)    GERD (gastroesophageal reflux disease)    no current meds.   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   History of diabetic ketoacidosis    hx multiple admission's  for dka (last admission in care everywhere [facility]08-20[facility] @ [facility] w/ mild DKA ,  right buttock abscess, and hyperglycemia   History of DVT of lower extremity    druing pregnancy   last one 04-29-2021  nonoccluded dvt LLE (per was 3 clots in same leg)   History of pregnancy induced hypertension    preeclampsia   Hx MRSA infection    Hx-TIA (transient ischemic attack) 09/2017   no residual's   Insulin dependent type 1 diabetes mellitus ([place]) 1997   followed by [facility] --- liliana  pitu NP;  (uncontrolled)  uses dexcom and has omnipod insulin pump   (09-18-2022  fasting average blood sugar 80-220)   Insulin pump in place    omnipod   MDD (major depressive disorder)    hx   Menorrhagia    Migraine    Panic disorder    Paroxysmal SVT (supraventricular tachycardia) 06/2017   (09-18-2022  per pt no issues since 2018)   PCOS (polycystic ovarian syndrome)    Vitamin D insufficiency     Past Surgical History:  Procedure Laterality Date   CESAREAN SECTION N/A 06/02/2021   Procedure: CESAREAN SECTION;  Surgeon: Paula Compton, MD;  Location: [facility];  Service: Obstetrics;  Laterality: N/A;   W/  BILATERAL SALPINGECTOMY   CHALAZION EXCISION Left    age 77  (stye)   CHOLECYSTECTOMY, LAPAROSCOPIC  12/02/2006   '@[facility]'$  by dr Rockne Coons   COLONOSCOPY  06/16/2017   DILATION AND EVACUATION  02/14/2007   '@[facility]'$  by dr Toney Rakes   ESOPHAGOGASTRODUODENOSCOPY  11/17/2011   Procedure: ESOPHAGOGASTRODUODENOSCOPY (EGD);  Surgeon: [name], MD;  Location: [facility];  Service: Endoscopy;  Laterality: N/A;   EXCISION OF BREAST BIOPSY Right    age 52  and reexcision for cancerous skin tag   (benign breast bx)   HYDRADENITIS EXCISION  04/30/2012   Procedure: EXCISION HYDRADENITIS GROIN;  Surgeon: [name], MD;  Location: [facility];  Service: General;  Laterality: Left;  wide excision hidradenitis bilateral thighs and Left groin   HYDRADENITIS EXCISION Left 01/13/2014   Procedure: WIDE EXCISION HIDRADENITIS LEFT AXILLA;  Surgeon: [name], MD;  Location: [place] [facility];  Service: General;  Laterality: Left;   HYSTEROSCOPY WITH D & C N/A 09/19/2022   Procedure: DILATATION AND CURETTAGE [place] WITH MYOSURE;  Surgeon: Paula Compton, MD;  Location: [facility];  Service: Gynecology;  Laterality: N/A;   INCISION AND DRAINAGE ABSCESS Right 03/17/2019   Procedure: INCISION AND DRAINAGE RIGHT THIGH ABSCESS;  Surgeon: [name],  MD;  Location: [place];  Service: General;  Laterality: Right;   INTRAUTERINE DEVICE (IUD) INSERTION N/A 09/19/2022   Procedure: INTRAUTERINE DEVICE (IUD) INSERTION;  Surgeon: Paula Compton, MD;  Location: [place];  Service: Gynecology;  Laterality: N/A;  Mirena   IRRIGATION AND DEBRIDEMENT ABSCESS Right 06/02/2014   Procedure: IRRIGATION AND DEBRIDEMENT ABSCESS;  Surgeon: [name], MD;  Location: [facility];  Service: General;  Laterality: Right;   IRRIGATION AND DEBRIDEMENT ABSCESS Left 09/23/2014   Procedure: IRRIGATION AND DEBRIDEMENT ABSCESS/LEFT THIGH;  Surgeon: [name], MD;  Location: [place];  Service: General;  Laterality: Left;   LEFT HEART CATHETERIZATION WITH CORONARY ANGIOGRAM N/A 07/14/2014   Procedure: LEFT HEART CATHETERIZATION WITH CORONARY ANGIOGRAM;  Surgeon: [name], MD;  Location[facility];  Service: Cardiovascular;  Laterality: N/A;   TAYLOR BUNIONECTOMY Left 2006   TONSILLECTOMY AND ADENOIDECTOMY  age 81   WISDOM TOOTH EXTRACTION      Family Psychiatric History: Reviewed.  Family History:  Family History  Problem Relation Age of Onset   Cancer Mother    Anxiety disorder Mother    Depression Mother    Sexual abuse Mother    Breast cancer Mother 37   Hyperlipidemia Sister    Hypertension Sister    Sexual abuse Sister    Hypertension Father    Anxiety disorder Father    Depression Father    Drug abuse Father    Stroke Paternal Uncle    ADD / ADHD Paternal Uncle    Anxiety disorder Paternal Uncle    Anxiety disorder Maternal Aunt    Seizures Maternal Aunt    Anxiety disorder Paternal Aunt    Anxiety disorder Maternal Uncle    ADD / ADHD Cousin    ADD / ADHD Daughter     Social History:  Social History   Socioeconomic History   Marital status: Married    Spouse name: Louis Meckel   Number of children: Not on file   Years of education: Not on file   Highest education level: Not on file  Occupational  History   Not on file  Tobacco Use   Smoking status: Every Day    Packs/day: 0.50    Years: 20.00    Total pack years: 10.00    Types: Cigarettes   Smokeless tobacco: Never   Tobacco comments:    09-18-2022  per pt had stopped smoking 09-22-2020 but has restart approx 08/ 2023 1/2 ppd,  total average smoking 20 yrs  Vaping Use   Vaping Use: Never used  Substance and Sexual Activity   Alcohol use: No    Alcohol/week: 0.0 standard drinks of alcohol   Drug use: Not Currently    Types: Marijuana    Comment: 2020   Sexual activity: Yes    Partners: Male    Birth control/protection: Surgical    Comment: tubal w/ c/s 06-02-2021  Other Topics Concern   Not on file  Social History Narrative   Not on file   Social Determinants of Health   Financial Resource Strain: Medium Risk (11/24/2018)   Overall Financial Resource Strain (CARDIA)    Difficulty of Paying Living Expenses: Somewhat hard  Food Insecurity: Food Insecurity Present (11/24/2018)   Hunger Vital Sign    Worried About Running Out of Food in the Last Year: Often true    Ran Out of Food in the Last Year: Sometimes true  Transportation Needs: No Transportation Needs (11/24/2018)   PRAPARE - Hydrologist (Medical): No    Lack of Transportation (Non-Medical): No  Physical Activity: Inactive (11/24/2018)   Exercise Vital Sign    Days of Exercise per Week: 0 days    Minutes of Exercise per Session: 0 min  Stress: Stress Concern Present (11/24/2018)   [place]    Feeling of Stress : Very much  Social Connections: Socially Integrated (11/24/2018)   Social Connection and Isolation Panel [NHANES]    Frequency of Communication with Friends and Family: Twice a week    Frequency of Social Gatherings with Friends and Family: Once a week    Attends Religious Services: 1 to 4 times per year    Active Member of [employer] or Organizations: Yes     Attends Archivist Meetings: Not on file    Marital Status: Living  with partner    Allergies:  Allergies  Allergen Reactions   Avandia [Rosiglitazone] Swelling    Swelling of face and legs   Avelox [Moxifloxacin] Shortness Of Breath and Rash   Latex Hives, Shortness Of Breath and Rash   Levaquin [Levofloxacin] Shortness Of Breath and Rash   Omalizumab Anaphylaxis and Rash    (Xolair)    Oxycodone Shortness Of Breath, Swelling and Rash    NORCO/ VICODIN OK   Peach [Prunus Persica] Hives and Shortness Of Breath   Potassium-Containing Compounds Other (See Comments)    IV ROUTE - CAUSES VEINS TO COLLAPS; Reports that it is undiluted K only   Prednisone Other (See Comments)    SEVERE ELEVATION OF BLOOD SUGAR. Able to tolerate 40 mg   Propoxyphene N-Acetaminophen Swelling    SWELLING OF FACE AND THROAT   Tomato Anaphylaxis    Raw tomato's //  cooked tomato's ok   Adhesive [Tape] Hives, Itching and Rash   Morphine And Related Other (See Comments)    Causes hallucinations   Prozac [Fluoxetine Hcl] Other (See Comments)    Made her very aggressive    Chantix [Varenicline]     dreams   Citrullus Vulgaris Nausea And Vomiting    Facial swelling   Humira [Adalimumab]     Does not remember    Zyvox [Linezolid] Swelling   Cefaclor Rash   Flagyl [Metronidazole] Rash   Keflex [Cephalexin] Diarrhea and Rash    REACTION: severe migraine   Promethazine Hcl Other (See Comments)    IV ROUTE ONLY - JITTERY FEELING. Patient reports that it is mild and she has used promethazine since then  PO tablet ok   Septra [Sulfamethoxazole-Trimethoprim] Rash   Sulfadiazine Rash    Metabolic Disorder Labs: Lab Results  Component Value Date   HGBA1C 6.9 (H) 05/30/2021   MPG 151.33 05/30/2021   MPG 280.48 12/25/2017   No results found for: "PROLACTIN" Lab Results  Component Value Date   CHOL 193 03/22/2018   TRIG 90.0 03/22/2018   HDL 62.80 03/22/2018   CHOLHDL 3 03/22/2018   VLDL  18.0 03/22/2018   LDLCALC 113 (H) 03/22/2018   LDLCALC 88 12/19/2015   Lab Results  Component Value Date   TSH 1.43 03/22/2018   TSH 0.773 12/25/2017    Therapeutic Level Labs: No results found for: "LITHIUM" No results found for: "VALPROATE" No results found for: "CBMZ"  Current Medications: Current Outpatient Medications  Medication Sig Dispense Refill   albuterol (VENTOLIN HFA) 108 (90 Base) MCG/ACT inhaler Inhale into the lungs every 6 (six) hours as needed for wheezing or shortness of breath.     aspirin EC 81 MG tablet Take 81 mg by mouth daily. Swallow whole.     BAQSIMI TWO PACK 3 MG/DOSE POWD Place 3 mg into both nostrils daily as needed. Low CBG     budesonide-formoterol (SYMBICORT) 160-4.5 MCG/ACT inhaler Inhale 2 puffs into the lungs as needed.     clonazePAM (KLONOPIN) 0.5 MG tablet Take 0.5 mg by mouth 2 (two) times daily as needed for anxiety.     Glucagon, rDNA, (GLUCAGON EMERGENCY) 1 MG KIT Inject into the vein as directed.     glucose blood ([employer] TEST STRIPS) test strip 1 each by Other route See admin instructions.      ibuprofen (MOTRIN IB) 200 MG tablet Take 3 tablets (600 mg total) by mouth every 6 (six) hours as needed. 30 tablet 0   Insulin Lispro (HUMALOG [place]) Inject into the  skin as directed. For Insulin pump  --   basal rate 1.4 units/ hr     pantoprazole (PROTONIX) 40 MG tablet Take 40 mg by mouth daily.     Vilazodone HCl (VIIBRYD) 20 MG TABS Take 2 tablets (40 mg total) by mouth daily. 60 tablet 1   Vitamin D, Ergocalciferol, (DRISDOL) 1.25 MG (50000 UNIT) CAPS capsule Take 50,000 Units by mouth every 7 (seven) days. Friday's     No current facility-administered medications for this visit.      Psychiatric Specialty Exam: please take into account limitations in obtaining a full MSE in the context of phone communication ROS - does not endorse    There were no vitals taken for this visit.There is no height or weight on file to calculate BMI.  General  Appearance: NA  Eye Contact:  NA  Speech:  Normal - no pressured speech   Volume:  Normal  Mood: reports mood currently stable and presents euthymic with an appropriate/full range of affect  Affect:  appropriate , full range   Thought Process:  Goal Directed and Linear  Orientation:  Full (Time, Place, and Person)  Thought Content: denies hallucinations, no delusions expressed, presents future oriented .   Suicidal Thoughts:  No-denies any suicidal or self injurious ideations   Homicidal Thoughts:  No- no HI   Memory:  Recent and remote grossly intact   Judgement:  Present   Insight:  Present   Psychomotor Activity:  NA  Concentration:  Grossly intact    Recall:  Good  Fund of Knowledge: Good  Language: Good  Akathisia:  Negative  Handed:  Right  AIMS (if indicated): not done  Assets:  Communication Skills Desire for Improvement Financial Resources/Insurance Housing Social Support Talents/Skills  ADL's:  Intact  Cognition: WNL  Sleep:  Fair   Screenings: GAD-7    [place] Office Visit from 04/04/2019 in [facility] Counselor from 12/14/2018 in [place] Counselor from 11/24/2018 in [facility]  Total GAD-7 Score '15 17 19      '$ PHQ2-9    Flowsheet Row Counselor from 10/07/2021 in [place] Office Visit from 04/04/2019 in [place] Counselor from 12/14/2018 in [place] Counselor from 11/24/2018 in [place] Patient Outreach from 03/14/2015 in [place]  PHQ-2 Total Score '5 4 4 6 1  '$ PHQ-9 Total Score '20 16 19 24 '$ --      Flowsheet Row Admission (Discharged) from 09/19/2022 in [facility] ED from 06/23/2022 in [facility] Counselor from 10/07/2021 in [facility] CATEGORY No Risk No Risk No Risk        Assessment and Plan: 42 yo female with hx of PTSD, bipolar 2 disorder and GAD/panic disorder.     Reports she is currently doing well in daily activities, including home life/employment.   Currently presents euthymic , does not endorse significant neurovegetative symptoms or anhedonia and does not present with or endorse symptoms of hypomania or mania . Anxiety symptoms also described as generally improved. She reports she has been facing some stressors, most recently 2 members of her extended family have become medically ill , requiring hospital care.  Tolerating medications well.  Reports Viibryd as well-tolerated and helpful, without side effects.  Started Vraylar (1.5 mg daily) recently and thus far has not had any side effects.  Side effects reviewed.  Patient reports that DM is currently better controlled.  She does have an upcoming appointment with her endocrinologist to review and recheck hemoglobin A1c.  Reviewed possible metabolic side effects related to Viibryd.  She reports she checks her glycemic control daily.      Dx: Bipolar 2 disorder; Anxiety/ GAD    Viibryd 40 mgr QDAY for depression, anxiety  Vraylar 1.5 mgr QDAY for mood disorder  Will see in about 4 -5 weeks She agrees to contact clinic sooner if any worsening prior  She also has crisis hotline number available if needed     Jenne Campus, MD 09/29/2022, 10:04 AM   Patient ID: Vernon Prey, female   DOB: 16-Sep-1981, 42 y.o.   MRN: [id number]

## 2022-10-13 ENCOUNTER — Telehealth (HOSPITAL_BASED_OUTPATIENT_CLINIC_OR_DEPARTMENT_OTHER): Payer: Medicaid Other | Admitting: Psychiatry

## 2022-10-13 ENCOUNTER — Encounter (HOSPITAL_COMMUNITY): Payer: Self-pay | Admitting: Psychiatry

## 2022-10-13 DIAGNOSIS — F3181 Bipolar II disorder: Secondary | ICD-10-CM | POA: Diagnosis not present

## 2022-10-13 NOTE — Progress Notes (Signed)
BH MD/PA/NP OP Progress Note  10/13/2022 3:59 PM [name]  MRN:  [id number]   Interview was conducted by phone and I verified that I was speaking with the correct person using two identifiers. I discussed the limitations of evaluation and management by telemedicine and  the availability of in person appointments.   Participants in the visit:  patient (location - home); physician (location - home office). Duration 20 minutes   Chief Complaint:  returns for medication management   HPI: 42 yo female with hx of PTSD, bipolar II disorder and GAD/panic disorder.   [name] reports she has been facing significant recent stressors . She states that her husband has left home again and is now staying in a shelter. Her son ( 36 months old) had a viral infection due to which she needed to take some time off work . Her home's heater is not working properly and she has moved in with her mother for now. Reports that because she had missed work recently due to above stressors her employer has placed her on a probationary status, and was told she cannot miss any further work days over the next month. She states that thankfully her son is now doing better and went back to daycare today and that her mother is supportive and can help with the children while she is working.   Reports mood has been " ok" in spite of above stressors . She does present intermittently tearful when reviewing stressors as above . Denies having any  suicidal ideations and identifies love and responsibility for her children as a protective factor . Denies hallucinations . No HI.   Tolerating medications well . Reports Viibryd has been effective and well tolerated .More recently started Vraylar at 1.5 mgr QDAY. Reports she is tolerating well thus far .    [name] reports she is doing well at present .  She describes functioning well in daily activities, including work (works at a [employer]) and home life.  She  describes mood has been "all right" and does not endorse significant mood swings or affective lability.  She does not endorse depression and currently appears euthymic with an appropriate/reactive affect.  Does not endorse anhedonia.  Does not endorse significant neurovegetative symptoms.  No manic symptoms are currently either noted or endorsed.   Patient is aware of  termination issues- writer  is leaving clinic in March 2024 , after which patient may continue management with another [facility] clinician  . She has expressed  understanding .   Provided support, and reviewed  coping skills / ego strengths, past successes in dealing with significant stressors.        Visit Diagnosis:  No diagnosis found.  Past Psychiatric History: Please see intake H&P.  Past Medical History:  Past Medical History:  Diagnosis Date   Asthma    followed by pcp   Bipolar 2 disorder ([place])    Chronic post-traumatic stress disorder (PTSD)    Factor V Leiden mutation ([place]) 04/2021   per pt had blood done by gyn dr Marvel Plan in her office  ;  only one episode DVT LLE 08/ 2022   Fibromyalgia    folllowed by pcp   GAD (generalized anxiety disorder)    GERD (gastroesophageal reflux disease)    no current meds.   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   History of diabetic ketoacidosis    hx multiple admission's for dka (last admission in care everywhere 07-08-2022 @ [facility] w/ mild DKA ,  right buttock abscess, and hyperglycemia   History of DVT of lower extremity    druing pregnancy   last one 04-29-2021  nonoccluded dvt LLE (per was 3 clots in same leg)   History of pregnancy induced hypertension    preeclampsia   Hx MRSA infection    Hx-TIA (transient ischemic attack) 09/2017   no  residual's   Insulin dependent type 1 diabetes mellitus ([place]) 1997   followed by [facility] --- liliana pitu NP;  (uncontrolled)  uses dexcom and has omnipod insulin pump   (09-18-2022  fasting average blood sugar 80-220)   Insulin pump in place    omnipod   MDD (major depressive disorder)    hx   Menorrhagia    Migraine    Panic disorder    Paroxysmal SVT (supraventricular tachycardia) 06/2017   (09-18-2022  per pt no issues since 2018)   PCOS (polycystic ovarian syndrome)    Vitamin D insufficiency     Past Surgical History:  Procedure Laterality Date   CESAREAN SECTION N/A 06/02/2021   Procedure: CESAREAN SECTION;  Surgeon: Paula Compton, MD;  Location: [facility];  Service: Obstetrics;  Laterality: N/A;   W/  BILATERAL SALPINGECTOMY   CHALAZION EXCISION Left    age 40  (stye)   CHOLECYSTECTOMY, LAPAROSCOPIC  12/02/2006   '@[facility]'$  by dr Rockne Coons   COLONOSCOPY  06/16/2017   DILATION AND EVACUATION  02/14/2007   '@[facility]'$  by dr Toney Rakes   ESOPHAGOGASTRODUODENOSCOPY  11/17/2011   Procedure: ESOPHAGOGASTRODUODENOSCOPY (EGD);  Surgeon: [name], MD;  Location: [facility];  Service: Endoscopy;  Laterality: N/A;   EXCISION OF BREAST BIOPSY Right    age 89  and reexcision for cancerous skin tag   (benign breast bx)   HYDRADENITIS EXCISION  04/30/2012   Procedure: EXCISION HYDRADENITIS GROIN;  Surgeon: [name], MD;  Location: [place];  Service: General;  Laterality: Left;  wide excision hidradenitis bilateral thighs and Left groin   HYDRADENITIS EXCISION Left 01/13/2014   Procedure: WIDE EXCISION HIDRADENITIS LEFT AXILLA;  Surgeon: [name], MD;  Location: [place];  Service: General;  Laterality: Left;   HYSTEROSCOPY WITH D & C N/A 09/19/2022   Procedure: DILATATION AND CURETTAGE [place] WITH MYOSURE;  Surgeon: Paula Compton, MD;  Location: [place];  Service: Gynecology;  Laterality: N/A;   INCISION AND  DRAINAGE ABSCESS Right 03/17/2019   Procedure: INCISION AND DRAINAGE RIGHT THIGH ABSCESS;  Surgeon: [name], MD;  Location: [place];  Service: General;  Laterality: Right;   INTRAUTERINE DEVICE (IUD) INSERTION N/A 09/19/2022   Procedure: INTRAUTERINE DEVICE (IUD) INSERTION;  Surgeon: Paula Compton, MD;  Location: [place];  Service: Gynecology;  Laterality: N/A;  Mirena   IRRIGATION AND DEBRIDEMENT ABSCESS Right 06/02/2014   Procedure: IRRIGATION AND DEBRIDEMENT ABSCESS;  Surgeon: [name], MD;  Location: [place];  Service: General;  Laterality: Right;   IRRIGATION AND DEBRIDEMENT ABSCESS Left 09/23/2014   Procedure: IRRIGATION AND DEBRIDEMENT ABSCESS/LEFT THIGH;  Surgeon: [name], MD;  Location: [place];  Service: General;  Laterality: Left;   LEFT HEART CATHETERIZATION WITH CORONARY ANGIOGRAM N/A  07/14/2014   Procedure: LEFT HEART CATHETERIZATION WITH CORONARY ANGIOGRAM;  Surgeon: [name], MD;  Location: [facility] CATH LAB;  Service: Cardiovascular;  Laterality: N/A;   TAYLOR BUNIONECTOMY Left 2006   TONSILLECTOMY AND ADENOIDECTOMY     age 6   WISDOM TOOTH EXTRACTION      Family Psychiatric History: Reviewed.  Family History:  Family History  Problem Relation Age of Onset   Cancer Mother    Anxiety disorder Mother    Depression Mother    Sexual abuse Mother    Breast cancer Mother 61   Hyperlipidemia Sister    Hypertension Sister    Sexual abuse Sister    Hypertension Father    Anxiety disorder Father    Depression Father    Drug abuse Father    Stroke Paternal Uncle    ADD / ADHD Paternal Uncle    Anxiety disorder Paternal Uncle    Anxiety disorder Maternal Aunt    Seizures Maternal Aunt    Anxiety disorder Paternal Aunt    Anxiety disorder Maternal Uncle    ADD / ADHD Cousin    ADD / ADHD Daughter     Social History:  Social History   Socioeconomic History   Marital status: Married    Spouse name: Louis Meckel   Number of  children: Not on file   Years of education: Not on file   Highest education level: Not on file  Occupational History   Not on file  Tobacco Use   Smoking status: Every Day    Packs/day: 0.50    Years: 20.00    Total pack years: 10.00    Types: Cigarettes   Smokeless tobacco: Never   Tobacco comments:    09-18-2022  per pt had stopped smoking 09-22-2020 but has restart approx 08/ 2023 1/2 ppd,  total average smoking 20 yrs  Vaping Use   Vaping Use: Never used  Substance and Sexual Activity   Alcohol use: No    Alcohol/week: 0.0 standard drinks of alcohol   Drug use: Not Currently    Types: Marijuana    Comment: 2020   Sexual activity: Yes    Partners: Male    Birth control/protection: Surgical    Comment: tubal w/ c/s 06-02-2021  Other Topics Concern   Not on file  Social History Narrative   Not on file   Social Determinants of Health   Financial Resource Strain: Medium Risk (11/24/2018)   Overall Financial Resource Strain (CARDIA)    Difficulty of Paying Living Expenses: Somewhat hard  Food Insecurity: Food Insecurity Present (11/24/2018)   Hunger Vital Sign    Worried About Running Out of Food in the Last Year: Often true    Ran Out of Food in the Last Year: Sometimes true  Transportation Needs: No Transportation Needs (11/24/2018)   PRAPARE - Hydrologist (Medical): No    Lack of Transportation (Non-Medical): No  Physical Activity: Inactive (11/24/2018)   Exercise Vital Sign    Days of Exercise per Week: 0 days    Minutes of Exercise per Session: 0 min  Stress: Stress Concern Present (11/24/2018)   [place]    Feeling of Stress : Very much  Social Connections: Socially Integrated (11/24/2018)   Social Connection and Isolation Panel [NHANES]    Frequency of Communication with Friends and Family: Twice a week    Frequency of Social Gatherings with Friends and Family: Once  a week     Attends Religious Services: 1 to 4 times per year    Active Member of Clubs or Organizations: Yes    Attends Archivist Meetings: Not on file    Marital Status: Living with partner    Allergies:  Allergies  Allergen Reactions   Avandia [Rosiglitazone] Swelling    Swelling of face and legs   Avelox [Moxifloxacin] Shortness Of Breath and Rash   Latex Hives, Shortness Of Breath and Rash   Levaquin [Levofloxacin] Shortness Of Breath and Rash   Omalizumab Anaphylaxis and Rash    (Xolair)    Oxycodone Shortness Of Breath, Swelling and Rash    NORCO/ VICODIN OK   Peach [Prunus Persica] Hives and Shortness Of Breath   Potassium-Containing Compounds Other (See Comments)    IV ROUTE - CAUSES VEINS TO COLLAPS; Reports that it is undiluted K only   Prednisone Other (See Comments)    SEVERE ELEVATION OF BLOOD SUGAR. Able to tolerate 40 mg   Propoxyphene N-Acetaminophen Swelling    SWELLING OF FACE AND THROAT   Tomato Anaphylaxis    Raw tomato's //  cooked tomato's ok   Adhesive [Tape] Hives, Itching and Rash   Morphine And Related Other (See Comments)    Causes hallucinations   Prozac [Fluoxetine Hcl] Other (See Comments)    Made her very aggressive    Chantix [Varenicline]     dreams   Citrullus Vulgaris Nausea And Vomiting    Facial swelling   Humira [Adalimumab]     Does not remember    Zyvox [Linezolid] Swelling   Cefaclor Rash   Flagyl [Metronidazole] Rash   Keflex [Cephalexin] Diarrhea and Rash    REACTION: severe migraine   Promethazine Hcl Other (See Comments)    IV ROUTE ONLY - JITTERY FEELING. Patient reports that it is mild and she has used promethazine since then  PO tablet ok   Septra [Sulfamethoxazole-Trimethoprim] Rash   Sulfadiazine Rash    Metabolic Disorder Labs: Lab Results  Component Value Date   HGBA1C 6.9 (H) 05/30/2021   MPG 151.33 05/30/2021   MPG 280.48 12/25/2017   No results found for: "PROLACTIN" Lab Results  Component Value  Date   CHOL 193 03/22/2018   TRIG 90.0 03/22/2018   HDL 62.80 03/22/2018   CHOLHDL 3 03/22/2018   VLDL 18.0 03/22/2018   LDLCALC 113 (H) 03/22/2018   LDLCALC 88 12/19/2015   Lab Results  Component Value Date   TSH 1.43 03/22/2018   TSH 0.773 12/25/2017    Therapeutic Level Labs: No results found for: "LITHIUM" No results found for: "VALPROATE" No results found for: "CBMZ"  Current Medications: Current Outpatient Medications  Medication Sig Dispense Refill   albuterol (VENTOLIN HFA) 108 (90 Base) MCG/ACT inhaler Inhale into the lungs every 6 (six) hours as needed for wheezing or shortness of breath.     aspirin EC 81 MG tablet Take 81 mg by mouth daily. Swallow whole.     BAQSIMI TWO PACK 3 MG/DOSE POWD Place 3 mg into both nostrils daily as needed. Low CBG     budesonide-formoterol (SYMBICORT) 160-4.5 MCG/ACT inhaler Inhale 2 puffs into the lungs as needed.     cariprazine (VRAYLAR) 1.5 MG capsule Take 1 capsule (1.5 mg total) by mouth daily. 30 capsule 1   clonazePAM (KLONOPIN) 0.5 MG tablet Take 0.5 mg by mouth 2 (two) times daily as needed for anxiety.     Glucagon, rDNA, (GLUCAGON EMERGENCY) 1 MG KIT Inject  into the vein as directed.     glucose blood ([employer] TEST STRIPS) test strip 1 each by Other route See admin instructions.      ibuprofen (MOTRIN IB) 200 MG tablet Take 3 tablets (600 mg total) by mouth every 6 (six) hours as needed. 30 tablet 0   Insulin Lispro (HUMALOG [place]) Inject into the skin as directed. For Insulin pump  --   basal rate 1.4 units/ hr     pantoprazole (PROTONIX) 40 MG tablet Take 40 mg by mouth daily.     Vilazodone HCl (VIIBRYD) 20 MG TABS Take 2 tablets (40 mg total) by mouth daily. 60 tablet 1   Vitamin D, Ergocalciferol, (DRISDOL) 1.25 MG (50000 UNIT) CAPS capsule Take 50,000 Units by mouth every 7 (seven) days. Friday's     No current facility-administered medications for this visit.      Psychiatric Specialty Exam: please take into account  limitations in obtaining a full MSE in the context of phone communication ROS - does not endorse    There were no vitals taken for this visit.There is no height or weight on file to calculate BMI.  General Appearance: NA  Eye Contact:  NA  Speech:  Normal - no pressured speech   Volume:  Normal  Mood: reports mood as " stressed out" in the context of significant current stressors   Affect:  intermittently tearful when reviewing stressors as above . Generally reactive   Thought Process:  Goal Directed and Linear  Orientation:  Full (Time, Place, and Person)  Thought Content: denies hallucinations, no delusions expressed, presents future oriented .   Suicidal Thoughts:  No-denies having any suicidal or self injurious ideations   Homicidal Thoughts:  No- no HI   Memory:  Recent and remote grossly intact   Judgement:  Present   Insight:  Present   Psychomotor Activity:  NA  Concentration:  Grossly intact    Recall:  Good  Fund of Knowledge: Good  Language: Good  Akathisia:  Negative  Handed:  Right  AIMS (if indicated): not done  Assets:  Communication Skills Desire for Improvement Financial Resources/Insurance Housing Social Support Talents/Skills  ADL's:  Intact  Cognition: WNL  Sleep:  Fair   Screenings: GAD-7    Personnel officer Visit from 04/04/2019 in [place] Counsel[facility] from 12/14/2018 in [place] [facility] from 11/24/2018 in [place]  Total GAD-7 Score '15 17 19      '$ PHQ2-9    Flowsheet Row Counselor from 10/07/2021 in [place] at [place] from 04/04/2019 in [place] Counsel[facility] from 12/14/2018 in [place] [facility] from 11/24/2018 in [place] Patient Outreach from 03/14/2015 in [place]  PHQ-2 Total Score '5 4 4 6 1  '$ PHQ-9 Total Score '20 16 19 24 '$ --      Flowsheet Row Admission (Discharged) from 09/19/2022 in [facility] ED from 06/23/2022 in [facility] Emergency Department at [place] from 10/07/2021 in [facility] at [place] No Risk No Risk No Risk        Assessment and Plan: 42 yo female with hx of PTSD, bipolar 2 disorder and GAD/panic disorder.     [name] has been facing increased stressors recently , including marital separation ( reports her husband left recently), son recently had viral  infection , and was told at work she is on probation and cannot miss further days .   She does not endorse severe or worsening depression but does state she has been " stressed" and presents briefly tearful during session when discussing stressors as above. Denies SI and identifies love / responsibility for her children as protective factor . She also reports mother, with whom she is currently staying , is supportive .  She reports Viibryd has been well tolerated and effective to help manage depression/mood. More recently started Vraylar as augmentation/mood stabilizer . Denies side effects.       Dx: Bipolar 2 disorder; Anxiety/ GAD    Viibryd 40 mgr QDAY for depression, anxiety  Vraylar 1.5 mgr QDAY for mood disorder  Does not currently need refills Will see in about 4 weeks  She agrees to contact clinic sooner if any worsening prior  She also has crisis hotline number available if needed     Jenne Campus, MD 10/13/2022, 3:59 PM   Patient ID: [name], female   DOB: [birth year], 42 y.o.   MRN: [id number]

## 2022-11-11 ENCOUNTER — Telehealth (HOSPITAL_COMMUNITY): Payer: Medicaid Other | Admitting: Psychiatry

## 2022-11-13 ENCOUNTER — Encounter (HOSPITAL_COMMUNITY): Payer: Self-pay | Admitting: Psychiatry

## 2022-11-13 ENCOUNTER — Telehealth (HOSPITAL_BASED_OUTPATIENT_CLINIC_OR_DEPARTMENT_OTHER): Payer: Medicaid Other | Admitting: Psychiatry

## 2022-11-13 DIAGNOSIS — F3181 Bipolar II disorder: Secondary | ICD-10-CM

## 2022-11-13 MED ORDER — CARIPRAZINE HCL 1.5 MG PO CAPS: 1.5000 mg | ORAL_CAPSULE | Freq: Every day | ORAL | 1 refills | Status: DC

## 2022-11-13 MED ORDER — VILAZODONE HCL 20 MG PO TABS: 40.0000 mg | ORAL_TABLET | Freq: Every day | ORAL | 1 refills | Status: DC

## 2022-11-13 NOTE — Progress Notes (Addendum)
[facility] MD/PA/NP OP Progress Note  11/13/2022 10:35 AM [name]  MRN:  [id number]   Interview was conducted by phone and I verified that I was speaking with the correct person using two identifiers. I discussed the limitations of evaluation and management by telemedicine and  the availability of in person appointments.   Participants in the visit:  patient (location - home); physician (location - home office). Duration 20 minutes   Chief Complaint:  returns for medication management   HPI: 42 yo female with hx of PTSD, bipolar II disorder and GAD/panic disorder.   Reports she is functioning in daily activities , including full time employment and home life , where she focuses on her children .  Reports she has been experiencing some depression and increased anxiety, which she attributes in part to psychosocial stressors . She also reports she has not been taking prescribed medications over recent days . Describes sleep as fair . Denies other significant neuro-vegetative symptoms. Denies suicidal ideations and identifies love for her children as a protective factor . She presents future oriented, for example, states that she is looking for another job , but plans to continue current job until she finds one that is more flexible.  She does state  she has been facing significant stressors . She explains that she had needed to take time off work to address family/ children issues . As a result , she reports that she has been placed on a probationary status at work, and cannot miss further days . Husband/ father of the children has a history of SUD , had recently left the home , and she reports he is currently admitted at a psychiatric unit for treatment . Fortunately, her mother is supportive and can help with the children , but she has chronic medical issues  Ms [name] reports that she was unable to renew her medications - Viibryd 20 mgr QDAY, Vraylar 1.5 mgr QDAY  due to insurance constraints and  reports that she was told they needed prior authorization . She has been off medications for close to a week.  Reviewed medication history. She reports Viibryd has been an effective medication for her , has historically responded well to it . She has been taking it " for a while now", without side effects and overall good response . Vraylar was started a few months ago, at 1.5 mgr QDAY . Reports has tolerated well and also feels it was helping .  She reports history of poor tolerance/ response to Prozac , Wellbutrin. She has a history of good response to lamotrigine , but preference was to discontinue this medication after she developed a rash ( benign)   Reviewed  termination issues- Probation officer  is leaving clinic in March 2024 , after which she will continue management with another [facility] clinician  . She expresses   understanding .   Provided support, and reviewed  coping skills / ego strengths, past successes in dealing with significant stressors.        Visit Diagnosis:  No diagnosis found.  Past Psychiatric History: Please see intake H&P.  Past Medical History:  Past Medical History:  Diagnosis Date   Asthma    followed by pcp   Bipolar 2 disorder ([place])    Chronic post-traumatic stress disorder (PTSD)    Factor V Leiden mutation ([place]) 04/2021   per pt had blood done by gyn dr Marvel Plan in her office  ;  only one episode DVT LLE 08/ 2022  Fibromyalgia    folllowed by pcp   GAD (generalized anxiety disorder)    GERD (gastroesophageal reflux disease)    no current meds.   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   History of diabetic ketoacidosis    hx multiple admission's for dka (last admission in care everywhere 07-08-2022 @ [facility] w/ mild DKA ,  right buttock  abscess, and hyperglycemia   History of DVT of lower extremity    druing pregnancy   last one 04-29-2021  nonoccluded dvt LLE (per was 3 clots in same leg)   History of pregnancy induced hypertension    preeclampsia   Hx MRSA infection    Hx-TIA (transient ischemic attack) 09/2017   no residual's   Insulin dependent type 1 diabetes mellitus ([place]) 1997   followed by [facility] --- liliana pitu NP;  (uncontrolled)  uses dexcom and has omnipod insulin pump   (09-18-2022  fasting average blood sugar 80-220)   Insulin pump in place    omnipod   MDD (major depressive disorder)    hx   Menorrhagia    Migraine    Panic disorder    Paroxysmal SVT (supraventricular tachycardia) 06/2017   (09-18-2022  per pt no issues since 2018)   PCOS (polycystic ovarian syndrome)    Vitamin D insufficiency     Past Surgical History:  Procedure Laterality Date   CESAREAN SECTION N/A 06/02/2021   Procedure: CESAREAN SECTION;  Surgeon: Paula Compton, MD;  Location: [facility];  Service: Obstetrics;  Laterality: N/A;   W/  BILATERAL SALPINGECTOMY   CHALAZION EXCISION Left    age 87  (stye)   CHOLECYSTECTOMY, LAPAROSCOPIC  12/02/2006   @[facility]$  by dr Rockne Coons   COLONOSCOPY  06/16/2017   DILATION AND EVACUATION  02/14/2007   @[facility]$  by dr Toney Rakes   ESOPHAGOGASTRODUODENOSCOPY  11/17/2011   Procedure: ESOPHAGOGASTRODUODENOSCOPY (EGD);  Surgeon: [name], MD;  Location: [facility];  Service: Endoscopy;  Laterality: N/A;   EXCISION OF BREAST BIOPSY Right    age 68  and reexcision for cancerous skin tag   (benign breast bx)   HYDRADENITIS EXCISION  04/30/2012   Procedure: EXCISION HYDRADENITIS GROIN;  Surgeon: [name], MD;  Location: [place];  Service: General;  Laterality: Left;  wide excision hidradenitis bilateral thighs and Left groin   HYDRADENITIS EXCISION Left 01/13/2014   Procedure: WIDE EXCISION HIDRADENITIS LEFT AXILLA;  Surgeon: [name], MD;   Location: [place];  Service: General;  Laterality: Left;   HYSTEROSCOPY WITH D & C N/A 09/19/2022   Procedure: DILATATION AND CURETTAGE [place] WITH MYOSURE;  Surgeon: Paula Compton, MD;  Location: [place];  Service: Gynecology;  Laterality: N/A;   INCISION AND DRAINAGE ABSCESS Right 03/17/2019   Procedure: INCISION AND DRAINAGE RIGHT THIGH ABSCESS;  Surgeon: [name], MD;  Location: [place];  Service: General;  Laterality: Right;   INTRAUTERINE DEVICE (IUD) INSERTION N/A 09/19/2022   Procedure: INTRAUTERINE DEVICE (IUD) INSERTION;  Surgeon: Paula Compton, MD;  Location: [place];  Service: Gynecology;  Laterality: N/A;  Mirena   IRRIGATION AND DEBRIDEMENT ABSCESS Right 06/02/2014   Procedure: IRRIGATION AND DEBRIDEMENT ABSCESS;  Surgeon: [name], MD;  Location: [place];  Service: General;  Laterality: Right;   IRRIGATION AND DEBRIDEMENT ABSCESS Left 09/23/2014   Procedure: IRRIGATION AND DEBRIDEMENT ABSCESS/LEFT THIGH;  Surgeon: [name], MD;  Location: [place];  Service: General;  Laterality: Left;   LEFT HEART CATHETERIZATION WITH CORONARY ANGIOGRAM N/A 07/14/2014   Procedure: LEFT HEART CATHETERIZATION WITH CORONARY ANGIOGRAM;  Surgeon: [name], MD;  Location[facility];  Service: Cardiovascular;  Laterality: N/A;   TAYLOR BUNIONECTOMY Left 2006   TONSILLECTOMY AND ADENOIDECTOMY     age 87   WISDOM TOOTH EXTRACTION      Family Psychiatric History: Reviewed.  Family History:  Family History  Problem Relation Age of Onset   Cancer Mother    Anxiety disorder Mother    Depression Mother    Sexual abuse Mother    Breast cancer Mother 27   Hyperlipidemia Sister    Hypertension Sister    Sexual abuse Sister    Hypertension Father    Anxiety disorder Father    Depression Father    Drug abuse Father    Stroke Paternal Uncle    ADD / ADHD Paternal Uncle    Anxiety disorder Paternal Uncle    Anxiety disorder  Maternal Aunt    Seizures Maternal Aunt    Anxiety disorder Paternal Aunt    Anxiety disorder Maternal Uncle    ADD / ADHD Cousin    ADD / ADHD Daughter     Social History:  Social History   Socioeconomic History   Marital status: Married    Spouse name: Louis Meckel   Number of children: Not on file   Years of education: Not on file   Highest education level: Not on file  Occupational History   Not on file  Tobacco Use   Smoking status: Every Day    Packs/day: 0.50    Years: 20.00    Total pack years: 10.00    Types: Cigarettes   Smokeless tobacco: Never   Tobacco comments:    09-18-2022  per pt had stopped smoking 09-22-2020 but has restart approx 08/ 2023 1/2 ppd,  total average smoking 20 yrs  Vaping Use   Vaping Use: Never used  Substance and Sexual Activity   Alcohol use: No    Alcohol/week: 0.0 standard drinks of alcohol   Drug use: Not Currently    Types: Marijuana    Comment: 2020   Sexual activity: Yes    Partners: Male    Birth control/protection: Surgical    Comment: tubal w/ c/s 06-02-2021  Other Topics Concern   Not on file  Social History Narrative   Not on file   Social Determinants of Health   Financial Resource Strain: Medium Risk (11/24/2018)   Overall Financial Resource Strain (CARDIA)    Difficulty of Paying Living Expenses: Somewhat hard  Food Insecurity: Food Insecurity Present (11/24/2018)   Hunger Vital Sign    Worried About Running Out of Food in the Last Year: Often true    Ran Out of Food in the Last Year: Sometimes true  Transportation Needs: No Transportation Needs (11/24/2018)   PRAPARE - Hydrologist (Medical): No    Lack of Transportation (Non-Medical): No  Physical Activity: Inactive (11/24/2018)   Exercise Vital Sign    Days of Exercise per Week: 0 days    Minutes of Exercise per Session: 0 min  Stress: Stress Concern Present (11/24/2018)   [employer] of  Occupational Health - Occupational  Stress Questionnaire    Feeling of Stress : Very much  Social Connections: Socially Integrated (11/24/2018)   Social Connection and Isolation Panel [NHANES]    Frequency of Communication with Friends and Family: Twice a week    Frequency of Social Gatherings with Friends and Family: Once a week    Attends Religious Services: 1 to 4 times per year    Active Member of [employer] or Organizations: Yes    Attends Archivist Meetings: Not on file    Marital Status: Living with partner    Allergies:  Allergies  Allergen Reactions   Avandia [Rosiglitazone] Swelling    Swelling of face and legs   Avelox [Moxifloxacin] Shortness Of Breath and Rash   Latex Hives, Shortness Of Breath and Rash   Levaquin [Levofloxacin] Shortness Of Breath and Rash   Omalizumab Anaphylaxis and Rash    (Xolair)    Oxycodone Shortness Of Breath, Swelling and Rash    NORCO/ VICODIN OK   Peach [Prunus Persica] Hives and Shortness Of Breath   Potassium-Containing Compounds Other (See Comments)    IV ROUTE - CAUSES VEINS TO COLLAPS; Reports that it is undiluted K only   Prednisone Other (See Comments)    SEVERE ELEVATION OF BLOOD SUGAR. Able to tolerate 40 mg   Propoxyphene N-Acetaminophen Swelling    SWELLING OF FACE AND THROAT   Tomato Anaphylaxis    Raw tomato's //  cooked tomato's ok   Adhesive [Tape] Hives, Itching and Rash   Morphine And Related Other (See Comments)    Causes hallucinations   Prozac [Fluoxetine Hcl] Other (See Comments)    Made her very aggressive    Chantix [Varenicline]     dreams   Citrullus Vulgaris Nausea And Vomiting    Facial swelling   Humira [Adalimumab]     Does not remember    Zyvox [Linezolid] Swelling   Cefaclor Rash   Flagyl [Metronidazole] Rash   Keflex [Cephalexin] Diarrhea and Rash    REACTION: severe migraine   Promethazine Hcl Other (See Comments)    IV ROUTE ONLY - JITTERY FEELING. Patient reports that it is mild and she has used promethazine since  then  PO tablet ok   Septra [Sulfamethoxazole-Trimethoprim] Rash   Sulfadiazine Rash    Metabolic Disorder Labs: Lab Results  Component Value Date   HGBA1C 6.9 (H) 05/30/2021   MPG 151.33 05/30/2021   MPG 280.48 12/25/2017   No results found for: "PROLACTIN" Lab Results  Component Value Date   CHOL 193 03/22/2018   TRIG 90.0 03/22/2018   HDL 62.80 03/22/2018   CHOLHDL 3 03/22/2018   VLDL 18.0 03/22/2018   LDLCALC 113 (H) 03/22/2018   LDLCALC 88 12/19/2015   Lab Results  Component Value Date   TSH 1.43 03/22/2018   TSH 0.773 12/25/2017    Therapeutic Level Labs: No results found for: "LITHIUM" No results found for: "VALPROATE" No results found for: "CBMZ"  Current Medications: Current Outpatient Medications  Medication Sig Dispense Refill   albuterol (VENTOLIN HFA) 108 (90 Base) MCG/ACT inhaler Inhale into the lungs every 6 (six) hours as needed for wheezing or shortness of breath.     aspirin EC 81 MG tablet Take 81 mg by mouth daily. Swallow whole.     BAQSIMI TWO PACK 3 MG/DOSE POWD Place 3 mg into both nostrils daily as needed. Low CBG     budesonide-formoterol (SYMBICORT) 160-4.5 MCG/ACT inhaler Inhale 2 puffs into the lungs as  needed.     cariprazine (VRAYLAR) 1.5 MG capsule Take 1 capsule (1.5 mg total) by mouth daily. 30 capsule 1   clonazePAM (KLONOPIN) 0.5 MG tablet Take 0.5 mg by mouth 2 (two) times daily as needed for anxiety.     Glucagon, rDNA, (GLUCAGON EMERGENCY) 1 MG KIT Inject into the vein as directed.     glucose blood ([employer] TEST STRIPS) test strip 1 each by Other route See admin instructions.      ibuprofen (MOTRIN IB) 200 MG tablet Take 3 tablets (600 mg total) by mouth every 6 (six) hours as needed. 30 tablet 0   Insulin Lispro (HUMALOG [place]) Inject into the skin as directed. For Insulin pump  --   basal rate 1.4 units/ hr     pantoprazole (PROTONIX) 40 MG tablet Take 40 mg by mouth daily.     Vilazodone HCl (VIIBRYD) 20 MG TABS Take 2 tablets  (40 mg total) by mouth daily. 60 tablet 1   Vitamin D, Ergocalciferol, (DRISDOL) 1.25 MG (50000 UNIT) CAPS capsule Take 50,000 Units by mouth every 7 (seven) days. Friday's     No current facility-administered medications for this visit.      Psychiatric Specialty Exam: please take into account limitations in obtaining a full MSE in the context of phone communication ROS - does not endorse    There were no vitals taken for this visit.There is no height or weight on file to calculate BMI.  General Appearance: NA  Eye Contact:  NA  Speech:  Normal - no pressured speech   Volume:  Normal  Mood:  endorses some depression.  Affect:  endorses anxiety related to stressors, presents appropriate, reactive   Thought Process:  Goal Directed and Linear  Orientation:  Full (Time, Place, and Person)  Thought Content: denies hallucinations, no delusions expressed, presents future oriented .   Suicidal Thoughts:  No-denies having any suicidal ideations and identifies children/family as protective factor   Homicidal Thoughts:  No- no HI   Memory:  Recent and remote grossly intact   Judgement:  Present   Insight:  Present   Psychomotor Activity:  NA  Concentration:  Grossly intact    Recall:  Good  Fund of Knowledge: Good  Language: Good  Akathisia:  Negative  Handed:  Right  AIMS (if indicated): not done  Assets:  Communication Skills Desire for Improvement Financial Resources/Insurance Housing Social Support Talents/Skills  ADL's:  Intact  Cognition: WNL  Sleep:  Fair   Screenings: GAD-7    Personnel officer Visit from 04/04/2019 in [place] Counselor from 12/14/2018 in [place] Counselor from 11/24/2018 in [place]  Total GAD-7 Score 15 17 19      $ PHQ2-9    Flowsheet Row Counselor from 10/07/2021 in [place] at [place] from 04/04/2019 in [place] Counselor from 12/14/2018 in [place] Counselor from 11/24/2018 in [place] Patient Outreach from 03/14/2015 in [place]  PHQ-2 Total Score 5 4 4 6 1  $ PHQ-9 Total Score 20 16 19 24 $ --      Flowsheet Row Admission (Discharged) from 09/19/2022 in [facility] ED from 06/23/2022 in [facility] Emergency Department at [place] from 10/07/2021 in [place] at [place] No Risk No Risk No Risk  Assessment and Plan: 42 yo female with hx of PTSD, bipolar 2 disorder and GAD/panic disorder.     Ms [name] reports she continues to function well in her daily activities , including at home and at work. She has been facing ongoing stressors- her husband had recently left the home ( is currently admitted to a psychiatric unit for management of SUD) , and she describes being on a probationary status at work , due to which she cannot miss further work days. She states that fortunately her mother is available to help care for her children, but states mother has health issues as well . She describes some depression, anxiety related to above stressors. Denies SI . Endorses decreased energy level . No psychotic symptoms endorsed or noted   Ms [name] reports that she has been off her medications ( Viibryd, Dietitian ) over the last several days due to insurance constraints /states she was told they would need to be authorized . She has been taking Viibryd for years and Vraylar for several weeks . She denies side effects and states Viibryd in particular has been an effective medication she has responded well to .  We reviewed history- reports that Wellbutrin , Zoloft, Prozac were not effective or poorly tolerated . Lamictal was helpful but she developed a rash ( benign, unclear if  related to lamotrigine ) but preferred to stop this medication .  We discussed options, including starting a new medication regimen. Her current preference is to continue current regimen if possible . She asks that they be continued / renewed .   Reviewed with Clinic staff - no requests for medication prior authorization or review have been received .   Reviewed termination issues , I am leaving clinic in March , after she will follow with another [facility]  psychiatrist , Dr Nelida Gores - she expresses understanding   Discussed IOP as a possible treatment option but states would not be able to participate in this program currently due to work related responsibilities .      Dx: Bipolar 2 disorder; Anxiety/ GAD    Viibryd 40 mgr QDAY for depression, anxiety  Vraylar 1.5 mgr QDAY for mood disorder  Next appointment  with Dr Nelida Gores  She agrees to contact clinic sooner if any worsening prior  She also has crisis hotline number available if needed   Have reviewed medication issues as above with clinic staff - will attempt to contact pharmacy to determine need for prior authorization .     Jenne Campus, MD 11/13/2022, 10:35 AM   Patient ID: [name], female   DOB: 08/30/1981, 42 y.o.   MRN: [id number]    ADDENDUM 11/13/2022 at 2:20 PM  Spoke with her  pharmacy to address above: Viibryd does not require prior auth and she can pick it up today . Arman Filter will require prior auth and pharmacy will fax /send in prior auth request later today  I spoke with patient to let her know. She expressed significant relief ,as she stated that Viibryd has historically been well tolerated and effective for her .   She states she will maintain communication with pharmacy ( and if needed clinic) to determine if Vraylar approved .   Gabriel Earing MD

## 2022-11-14 ENCOUNTER — Telehealth (HOSPITAL_COMMUNITY): Payer: Self-pay

## 2022-11-14 NOTE — Telephone Encounter (Signed)
PA initiated for Vraylar 1.5 mg via CoverMyMeds approved PA case [id number] Coverage 11/14/22----11/14/23 Patient and pharmacy made aware

## 2022-11-25 ENCOUNTER — Telehealth (HOSPITAL_BASED_OUTPATIENT_CLINIC_OR_DEPARTMENT_OTHER): Payer: Medicaid Other | Admitting: Psychiatry

## 2022-11-25 ENCOUNTER — Encounter (HOSPITAL_COMMUNITY): Payer: Self-pay | Admitting: Psychiatry

## 2022-11-25 DIAGNOSIS — F3181 Bipolar II disorder: Secondary | ICD-10-CM | POA: Diagnosis not present

## 2022-11-25 MED ORDER — CARIPRAZINE HCL 1.5 MG PO CAPS: 1.5000 mg | ORAL_CAPSULE | Freq: Every day | ORAL | 1 refills | Status: AC

## 2022-11-25 MED ORDER — VILAZODONE HCL 20 MG PO TABS: 40.0000 mg | ORAL_TABLET | Freq: Every day | ORAL | 1 refills | Status: DC

## 2022-11-25 NOTE — Progress Notes (Signed)
[place] MD/PA/NP OP Progress Note  11/25/2022 3:47 PM [name]  MRN:  [id number]   Interview was conducted by phone and I verified that I was speaking with the correct person using two identifiers. I discussed the limitations of evaluation and management by telemedicine and  the availability of in person appointments.   Participants in the visit:  patient (location - home); physician (location - home office). Duration 20 minutes   Chief Complaint:  returns for medication management   HPI: 42 yo female with hx of PTSD, bipolar II disorder and GAD/panic disorder.   [name] reports some improvement in mood compared to last visit , and states she has been feeling " a little bit better" , less overwhelmed .  She has been functioning well in daily activities, continues to work full time in addition to caring for her two children. She reports her husband , who has a history of substance use disorder, was recently discharged from detox setting and is back home , waiting to go to a long term ( 6 month) rehab setting . She reports she is happy about his current sobriety and that she was able to have a clear and positive conversation with him where she set boundaries and limits appropriately .   Her work continues to be a significant stressor. She reports that she has been placed on a probationary status where she cannot miss any more days of work and that she has been given more responsibilities as well . She reports that thankfully her mother is available to help with the children , but that she is looking for other employment options . She presents future oriented and expresses she is looking forward to getting a job she may be able to do from home.   Currently does not endorse anhedonia , energy level described as partially improved   She had recently stopped taking her psychiatric medicatdions ( Viibryd, Dietitian ) in the context of insurance constraints. Over the last few weeks she resumed Viibryd  and states she is feeling noticeably better since she started it and that she has a history of good response to this medication. She has not yet resumed Vraylar, which she also  reports was well tolerated and helpful , but it has been approved by insurance and plans to resume today.   Side effects reviewed .   Reviewed  termination issues- Probation officer  is leaving clinic in March  - she will continue management with another [facility] clinician  . She expresses   understanding .   Provided support, and reviewed  coping skills / ego strengths, past successes in dealing with significant stressors.        Visit Diagnosis:  No diagnosis found.  Past Psychiatric History: Please see intake H&P.  Past Medical History:  Past Medical History:  Diagnosis Date   Asthma    followed by pcp   Bipolar 2 disorder ([place])    Chronic post-traumatic stress disorder (PTSD)    Factor V Leiden mutation ([place]) 04/2021   per pt had blood done by gyn dr Marvel Plan in her office  ;  only one episode DVT LLE 08/ 2022   Fibromyalgia    folllowed by pcp   GAD (generalized anxiety disorder)    GERD (gastroesophageal reflux disease)    no current meds.   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   History of diabetic ketoacidosis    hx multiple admission's for dka (last admission in care everywhere 07-08-2022 @ [facility] w/ mild DKA ,  right buttock abscess, and hyperglycemia   History of DVT of lower extremity    druing pregnancy   last one 04-29-2021  nonoccluded dvt LLE (per was 3 clots in same leg)   History of pregnancy induced hypertension    preeclampsia   Hx MRSA infection    Hx-TIA (transient ischemic attack) 09/2017   no residual's   Insulin dependent type 1 diabetes mellitus ([place]) 1997   followed by  [facility] --- liliana pitu NP;  (uncontrolled)  uses dexcom and has omnipod insulin pump   (09-18-2022  fasting average blood sugar 80-220)   Insulin pump in place    omnipod   MDD (major depressive disorder)    hx   Menorrhagia    Migraine    Panic disorder    Paroxysmal SVT (supraventricular tachycardia) 06/2017   (09-18-2022  per pt no issues since 2018)   PCOS (polycystic ovarian syndrome)    Vitamin D insufficiency     Past Surgical History:  Procedure Laterality Date   CESAREAN SECTION N/A 06/02/2021   Procedure: CESAREAN SECTION;  Surgeon: Paula Compton, MD;  Location: [facility] LD [facility];  Service: Obstetrics;  Laterality: N/A;   W/  BILATERAL SALPINGECTOMY   CHALAZION EXCISION Left    age 10  (stye)   CHOLECYSTECTOMY, LAPAROSCOPIC  12/02/2006   '@[facility]'$  by dr Rockne Coons   COLONOSCOPY  06/16/2017   DILATION AND EVACUATION  02/14/2007   '@[facility]'$  by dr Toney Rakes   ESOPHAGOGASTRODUODENOSCOPY  11/17/2011   Procedure: ESOPHAGOGASTRODUODENOSCOPY (EGD);  Surgeon: [name], MD;  Location: [facility];  Service: Endoscopy;  Laterality: N/A;   EXCISION OF BREAST BIOPSY Right    age 56  and reexcision for cancerous skin tag   (benign breast bx)   HYDRADENITIS EXCISION  04/30/2012   Procedure: EXCISION HYDRADENITIS GROIN;  Surgeon: [name], MD;  Location: [place];  Service: General;  Laterality: Left;  wide excision hidradenitis bilateral thighs and Left groin   HYDRADENITIS EXCISION Left 01/13/2014   Procedure: WIDE EXCISION HIDRADENITIS LEFT AXILLA;  Surgeon: [name], MD;  Location: [place];  Service: General;  Laterality: Left;   HYSTEROSCOPY WITH D & C N/A 09/19/2022   Procedure: DILATATION AND CURETTAGE [place] WITH MYOSURE;  Surgeon: Paula Compton, MD;  Location: [place];  Service: Gynecology;  Laterality: N/A;   INCISION AND DRAINAGE ABSCESS Right 03/17/2019   Procedure: INCISION AND DRAINAGE RIGHT THIGH  ABSCESS;  Surgeon: [name], MD;  Location: [place];  Service: General;  Laterality: Right;   INTRAUTERINE DEVICE (IUD) INSERTION N/A 09/19/2022   Procedure: INTRAUTERINE DEVICE (IUD) INSERTION;  Surgeon: Paula Compton, MD;  Location: [place];  Service: Gynecology;  Laterality: N/A;  Mirena   IRRIGATION AND DEBRIDEMENT ABSCESS Right 06/02/2014   Procedure: IRRIGATION AND DEBRIDEMENT ABSCESS;  Surgeon: [name], MD;  Location: [place];  Service: General;  Laterality: Right;   IRRIGATION AND DEBRIDEMENT ABSCESS Left 09/23/2014   Procedure: IRRIGATION AND DEBRIDEMENT ABSCESS/LEFT THIGH;  Surgeon: [name], MD;  Location: [place];  Service: General;  Laterality: Left;   LEFT HEART CATHETERIZATION WITH CORONARY ANGIOGRAM  N/A 07/14/2014   Procedure: LEFT HEART CATHETERIZATION WITH CORONARY ANGIOGRAM;  Surgeon: [name], MD;  Location: [facility];  Service: Cardiovascular;  Laterality: N/A;   TAYLOR BUNIONECTOMY Left 2006   TONSILLECTOMY AND ADENOIDECTOMY     age 84   WISDOM TOOTH EXTRACTION      Family Psychiatric History: Reviewed.  Family History:  Family History  Problem Relation Age of Onset   Cancer Mother    Anxiety disorder Mother    Depression Mother    Sexual abuse Mother    Breast cancer Mother 75   Hyperlipidemia Sister    Hypertension Sister    Sexual abuse Sister    Hypertension Father    Anxiety disorder Father    Depression Father    Drug abuse Father    Stroke Paternal Uncle    ADD / ADHD Paternal Uncle    Anxiety disorder Paternal Uncle    Anxiety disorder Maternal Aunt    Seizures Maternal Aunt    Anxiety disorder Paternal Aunt    Anxiety disorder Maternal Uncle    ADD / ADHD Cousin    ADD / ADHD Daughter     Social History:  Social History   Socioeconomic History   Marital status: Married    Spouse name: Louis Meckel   Number of children: Not on file   Years of education: Not on file   Highest education  level: Not on file  Occupational History   Not on file  Tobacco Use   Smoking status: Every Day    Packs/day: 0.50    Years: 20.00    Total pack years: 10.00    Types: Cigarettes   Smokeless tobacco: Never   Tobacco comments:    09-18-2022  per pt had stopped smoking 09-22-2020 but has restart approx 08/ 2023 1/2 ppd,  total average smoking 20 yrs  Vaping Use   Vaping Use: Never used  Substance and Sexual Activity   Alcohol use: No    Alcohol/week: 0.0 standard drinks of alcohol   Drug use: Not Currently    Types: Marijuana    Comment: 2020   Sexual activity: Yes    Partners: Male    Birth control/protection: Surgical    Comment: tubal w/ c/s 06-02-2021  Other Topics Concern   Not on file  Social History Narrative   Not on file   Social Determinants of Health   Financial Resource Strain: Medium Risk (11/24/2018)   Overall Financial Resource Strain (CARDIA)    Difficulty of Paying Living Expenses: Somewhat hard  Food Insecurity: Food Insecurity Present (11/24/2018)   Hunger Vital Sign    Worried About Running Out of Food in the Last Year: Often true    Ran Out of Food in the Last Year: Sometimes true  Transportation Needs: No Transportation Needs (11/24/2018)   PRAPARE - Hydrologist (Medical): No    Lack of Transportation (Non-Medical): No  Physical Activity: Inactive (11/24/2018)   Exercise Vital Sign    Days of Exercise per Week: 0 days    Minutes of Exercise per Session: 0 min  Stress: Stress Concern Present (11/24/2018)   [place]    Feeling of Stress : Very much  Social Connections: Socially Integrated (11/24/2018)   Social Connection and Isolation Panel [NHANES]    Frequency of Communication with Friends and Family: Twice a week    Frequency of Social Gatherings with Friends and Family:  Once a week    Attends Religious Services: 1 to 4 times per year    Active Member of  Clubs or Organizations: Yes    Attends Archivist Meetings: Not on file    Marital Status: Living with partner    Allergies:  Allergies  Allergen Reactions   Avandia [Rosiglitazone] Swelling    Swelling of face and legs   Avelox [Moxifloxacin] Shortness Of Breath and Rash   Latex Hives, Shortness Of Breath and Rash   Levaquin [Levofloxacin] Shortness Of Breath and Rash   Omalizumab Anaphylaxis and Rash    (Xolair)    Oxycodone Shortness Of Breath, Swelling and Rash    NORCO/ VICODIN OK   Peach [Prunus Persica] Hives and Shortness Of Breath   Potassium-Containing Compounds Other (See Comments)    IV ROUTE - CAUSES VEINS TO COLLAPS; Reports that it is undiluted K only   Prednisone Other (See Comments)    SEVERE ELEVATION OF BLOOD SUGAR. Able to tolerate 40 mg   Propoxyphene N-Acetaminophen Swelling    SWELLING OF FACE AND THROAT   Tomato Anaphylaxis    Raw tomato's //  cooked tomato's ok   Adhesive [Tape] Hives, Itching and Rash   Morphine And Related Other (See Comments)    Causes hallucinations   Prozac [Fluoxetine Hcl] Other (See Comments)    Made her very aggressive    Chantix [Varenicline]     dreams   Citrullus Vulgaris Nausea And Vomiting    Facial swelling   Humira [Adalimumab]     Does not remember    Zyvox [Linezolid] Swelling   Cefaclor Rash   Flagyl [Metronidazole] Rash   Keflex [Cephalexin] Diarrhea and Rash    REACTION: severe migraine   Promethazine Hcl Other (See Comments)    IV ROUTE ONLY - JITTERY FEELING. Patient reports that it is mild and she has used promethazine since then  PO tablet ok   Septra [Sulfamethoxazole-Trimethoprim] Rash   Sulfadiazine Rash    Metabolic Disorder Labs: Lab Results  Component Value Date   HGBA1C 6.9 (H) 05/30/2021   MPG 151.33 05/30/2021   MPG 280.48 12/25/2017   No results found for: "PROLACTIN" Lab Results  Component Value Date   CHOL 193 03/22/2018   TRIG 90.0 03/22/2018   HDL 62.80 03/22/2018    CHOLHDL 3 03/22/2018   VLDL 18.0 03/22/2018   LDLCALC 113 (H) 03/22/2018   LDLCALC 88 12/19/2015   Lab Results  Component Value Date   TSH 1.43 03/22/2018   TSH 0.773 12/25/2017    Therapeutic Level Labs: No results found for: "LITHIUM" No results found for: "VALPROATE" No results found for: "CBMZ"  Current Medications: Current Outpatient Medications  Medication Sig Dispense Refill   albuterol (VENTOLIN HFA) 108 (90 Base) MCG/ACT inhaler Inhale into the lungs every 6 (six) hours as needed for wheezing or shortness of breath.     aspirin EC 81 MG tablet Take 81 mg by mouth daily. Swallow whole.     BAQSIMI TWO PACK 3 MG/DOSE POWD Place 3 mg into both nostrils daily as needed. Low CBG     budesonide-formoterol (SYMBICORT) 160-4.5 MCG/ACT inhaler Inhale 2 puffs into the lungs as needed.     cariprazine (VRAYLAR) 1.5 MG capsule Take 1 capsule (1.5 mg total) by mouth daily. 30 capsule 1   clonazePAM (KLONOPIN) 0.5 MG tablet Take 0.5 mg by mouth 2 (two) times daily as needed for anxiety.     Glucagon, rDNA, (GLUCAGON EMERGENCY) 1 MG KIT  Inject into the vein as directed.     glucose blood ([employer] TEST STRIPS) test strip 1 each by Other route See admin instructions.      ibuprofen (MOTRIN IB) 200 MG tablet Take 3 tablets (600 mg total) by mouth every 6 (six) hours as needed. 30 tablet 0   Insulin Lispro (HUMALOG [place]) Inject into the skin as directed. For Insulin pump  --   basal rate 1.4 units/ hr     pantoprazole (PROTONIX) 40 MG tablet Take 40 mg by mouth daily.     Vilazodone HCl (VIIBRYD) 20 MG TABS Take 2 tablets (40 mg total) by mouth daily. 60 tablet 1   Vitamin D, Ergocalciferol, (DRISDOL) 1.25 MG (50000 UNIT) CAPS capsule Take 50,000 Units by mouth every 7 (seven) days. Friday's     No current facility-administered medications for this visit.      Psychiatric Specialty Exam: please take into account limitations in obtaining a full MSE in the context of phone communication ROS -  does not endorse    There were no vitals taken for this visit.There is no height or weight on file to calculate BMI.  General Appearance: NA  Eye Contact:  NA  Speech:  Normal - no pressured speech   Volume:  Normal  Mood:  endorses improving mood . Describes feeling more optimistic about future, such as getting a better job   Affect: improving , more reactive .   Thought Process:  Goal Directed and Linear  Orientation:  Full (Time, Place, and Person)  Thought Content: denies hallucinations, no delusions expressed, presents future oriented .   Suicidal Thoughts:  No-denies having any suicidal ideations and identifies children/family as protective factor   Homicidal Thoughts:  No- no HI   Memory:  Recent and remote grossly intact   Judgement:  Present   Insight:  Present   Psychomotor Activity:  NA  Concentration:  Grossly intact    Recall:  Good  Fund of Knowledge: Good  Language: Good  Akathisia:  Negative  Handed:  Right  AIMS (if indicated): not done  Assets:  Communication Skills Desire for Improvement Financial Resources/Insurance Housing Social Support Talents/Skills  ADL's:  Intact  Cognition: WNL  Sleep:  Fair   Screenings: GAD-7    Personnel officer Visit from 04/04/2019 in [place] Counselor from 12/14/2018 in [place] Counselor from 11/24/2018 in [place]  Total GAD-7 Score '15 17 19      '$ PHQ2-9    Flowsheet Row Counselor from 10/07/2021 in [place] [facility] [place] from 04/04/2019 in [place] Counselor from 12/14/2018 in [place] Counselor from 11/24/2018 in [place] Patient Outreach from 03/14/2015 in [place]  PHQ-2 Total Score '5 4 4 6 1  '$ PHQ-9 Total Score '20 16 19 24 '$ --       Flowsheet Row Admission (Discharged) from 09/19/2022 in [facility] ED from 06/23/2022 in [facility] at [place] from 10/07/2021 in [place] at [place] No Risk No Risk No Risk        Assessment and Plan: 42 yo female with hx of PTSD, bipolar 2 disorder and GAD/panic disorder.     [name] reports improving mood and presents with improving affect . Presents future oriented . No SI. Attributes improvement in  part to having been able to resume Viibryd recently . She denies side effects and reports a history of good response to this medication . She also plans to restart Vraylar, which she had taken for a period of several weeks with good response and tolerance, but which had been stopped due to insurance/ prior approval constraints. Report is that it has been approved.   She reports she is doing well in daily activities , including full time work and at home. She does report her current job is stressful and demanding and is looking for other job opportunities , particularly if possible would like to work from home . Husband has history of SUD , recently completed detox and is planning on going to a long term rehab soon.   Denies medication side effects and as above , reports history of good response and tolerance to Viibryd and [employer]. Side effects have been reviewed.   Reviewed termination issues- Probation officer is leaving clinic in March. She  will follow with another [facility]  psychiatrist- she expresses understanding.      Dx: Bipolar 2 disorder; Anxiety/ GAD    Viibryd 40 mgr QDAY for depression, anxiety  Vraylar 1.5 mgr QDAY for mood disorder  Next appointment  in 4-5 weeks with her new clinician  She agrees to contact clinic sooner if any worsening prior or concerns She also has crisis hotline number available if needed      Barbara Campus, MD 11/25/2022, 3:47 PM   Patient  ID: [name], female   DOB: November 12, 1980, 42 y.o.   MRN: [id number]

## 2022-12-07 ENCOUNTER — Telehealth: Payer: Self-pay | Admitting: Internal Medicine

## 2022-12-07 MED ORDER — OMNIPOD 5 DEXG7G6 PODS GEN 5 MISC: 99 refills | Status: AC

## 2022-12-07 NOTE — Telephone Encounter (Signed)
Asks refill diabetic supplies- unable to reach endocrinologist. Sent to [place]

## 2022-12-07 NOTE — Telephone Encounter (Signed)
Needs DM supplies- can't reach endcrinologist Sent to [facility], [place]

## 2023-01-05 ENCOUNTER — Ambulatory Visit (HOSPITAL_COMMUNITY): Payer: Medicaid Other | Admitting: Psychiatry

## 2023-01-12 ENCOUNTER — Ambulatory Visit (HOSPITAL_COMMUNITY): Payer: Medicaid Other | Admitting: Psychiatry

## 2023-02-19 ENCOUNTER — Encounter: Payer: Self-pay | Admitting: Cardiology

## 2023-02-19 DIAGNOSIS — R072 Precordial pain: Secondary | ICD-10-CM | POA: Insufficient documentation

## 2023-02-19 NOTE — Progress Notes (Deleted)
Cardiology Office Note   Date:  02/19/2023   ID:  [name], DOB Oct 03, 1980, MRN [id number]  PCP:  Barbara Montana, MD  Cardiologist:   None Referring:  ***  No chief complaint on file.     History of Present Illness: [name] is a 42 y.o. female who presents for evaluation of chest pain.  ***    Echo in 2019 demonstrated no significant abnormalities.  ***   Past Medical History:  Diagnosis Date   Asthma    followed by pcp   Bipolar 2 disorder (HCC)    Chronic post-traumatic stress disorder (PTSD)    Factor V Leiden mutation (HCC) 04/2021   per pt had blood done by gyn dr [name] in her office  ;  only one episode DVT LLE 08/ 2022   Fibromyalgia    folllowed by pcp   GAD (generalized anxiety disorder)    GERD (gastroesophageal reflux disease)    no current meds.   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   History of diabetic ketoacidosis    hx multiple admission's for dka (last admission in care everywhere 07-08-2022 @ [facility] w/ mild DKA ,  right buttock abscess, and hyperglycemia   History of DVT of lower extremity    druing pregnancy   last one 04-29-2021  nonoccluded dvt LLE (per was 3 clots in same leg)   History of pregnancy induced hypertension    preeclampsia   Hx MRSA infection    Hx-TIA (transient ischemic attack) 09/2017   no residual's   Insulin dependent type 1 diabetes mellitus (HCC) 1997   followed by [facility] --- Barbara pitu NP;  (uncontrolled)  uses dexcom and has omnipod insulin pump   (09-18-2022  fasting average blood sugar 80-220)   Insulin pump in place    omnipod   MDD (major depressive disorder)    hx   Menorrhagia    Migraine    Panic disorder    Paroxysmal SVT (supraventricular tachycardia) 06/2017    (09-18-2022  per pt no issues since 2018)   PCOS (polycystic ovarian syndrome)    Vitamin D insufficiency     Past Surgical History:  Procedure Laterality Date   CESAREAN SECTION N/A 06/02/2021   Procedure: CESAREAN SECTION;  Surgeon: Barbara Cote, MD;  Location: [facility];  Service: Obstetrics;  Laterality: N/A;   W/  BILATERAL SALPINGECTOMY   CHALAZION EXCISION Left    age 49  (stye)   CHOLECYSTECTOMY, LAPAROSCOPIC  12/02/2006   @[facility]  by dr [name]   COLONOSCOPY  06/16/2017   DILATION AND EVACUATION  02/14/2007   @[facility]  by dr Lily Peer   ESOPHAGOGASTRODUODENOSCOPY  11/17/2011   Procedure: ESOPHAGOGASTRODUODENOSCOPY (EGD);  Surgeon: Barbara Friar, MD;  Location: [facility];  Service: Endoscopy;  Laterality: N/A;   EXCISION OF BREAST BIOPSY Right    age 82  and reexcision for cancerous skin tag   (benign breast bx)   HYDRADENITIS EXCISION  04/30/2012   Procedure: EXCISION HYDRADENITIS GROIN;  Surgeon: Barbara Rubenstein, MD;  Location: [facility];  Service: General;  Laterality: Left;  wide excision hidradenitis bilateral thighs and Left groin   HYDRADENITIS EXCISION Left 01/13/2014  Procedure: WIDE EXCISION HIDRADENITIS LEFT AXILLA;  Surgeon: Barbara Rubenstein, MD;  Location: [facility] OR;  Service: General;  Laterality: Left;   HYSTEROSCOPY WITH D & C N/A 09/19/2022   Procedure: DILATATION AND CURETTAGE Melton Krebs WITH MYOSURE;  Surgeon: Barbara Cote, MD;  Location: [facility] [place];  Service: Gynecology;  Laterality: N/A;   INCISION AND DRAINAGE ABSCESS Right 03/17/2019   Procedure: INCISION AND DRAINAGE RIGHT THIGH ABSCESS;  Surgeon: Barbara Bouillon, MD;  Location: [facility] OR;  Service: General;  Laterality: Right;   INTRAUTERINE DEVICE (IUD) INSERTION N/A 09/19/2022   Procedure: INTRAUTERINE DEVICE (IUD) INSERTION;  Surgeon: Barbara Cote, MD;  Location: [facility];  Service: Gynecology;  Laterality: N/A;  Mirena   IRRIGATION AND  DEBRIDEMENT ABSCESS Right 06/02/2014   Procedure: IRRIGATION AND DEBRIDEMENT ABSCESS;  Surgeon: Barbara Miyamoto, MD;  Location: [facility] OR;  Service: General;  Laterality: Right;   IRRIGATION AND DEBRIDEMENT ABSCESS Left 09/23/2014   Procedure: IRRIGATION AND DEBRIDEMENT ABSCESS/LEFT THIGH;  Surgeon: Barbara Gelinas, MD;  Location: [facility] OR;  Service: General;  Laterality: Left;   LEFT HEART CATHETERIZATION WITH CORONARY ANGIOGRAM N/A 07/14/2014   Procedure: LEFT HEART CATHETERIZATION WITH CORONARY ANGIOGRAM;  Surgeon: Barbara M Swaziland, MD;  Location: [facility];  Service: Cardiovascular;  Laterality: N/A;   TAYLOR BUNIONECTOMY Left 2006   TONSILLECTOMY AND ADENOIDECTOMY     age 51   WISDOM TOOTH EXTRACTION       Current Outpatient Medications  Medication Sig Dispense Refill   albuterol (VENTOLIN HFA) 108 (90 Base) MCG/ACT inhaler Inhale into the lungs every 6 (six) hours as needed for wheezing or shortness of breath.     aspirin EC 81 MG tablet Take 81 mg by mouth daily. Swallow whole.     BAQSIMI TWO PACK 3 MG/DOSE POWD Place 3 mg into both nostrils daily as needed. Low CBG     budesonide-formoterol (SYMBICORT) 160-4.5 MCG/ACT inhaler Inhale 2 puffs into the lungs as needed.     cariprazine (VRAYLAR) 1.5 MG capsule Take 1 capsule (1.5 mg total) by mouth daily. 30 capsule 1   clonazePAM (KLONOPIN) 0.5 MG tablet Take 0.5 mg by mouth 2 (two) times daily as needed for anxiety.     Glucagon, rDNA, (GLUCAGON EMERGENCY) 1 MG KIT Inject into the vein as directed.     glucose blood ([employer] TEST STRIPS) test strip 1 each by Other route See admin instructions.      ibuprofen (MOTRIN IB) 200 MG tablet Take 3 tablets (600 mg total) by mouth every 6 (six) hours as needed. 30 tablet 0   Insulin Disposable Pump (OMNIPOD 5 G6 PODS, GEN 5,) MISC As directed 30 each PRN   Insulin Lispro (HUMALOG [place]) Inject into the skin as directed. For Insulin pump  --   basal rate 1.4 units/ hr     pantoprazole (PROTONIX) 40 MG  tablet Take 40 mg by mouth daily.     Vilazodone HCl (VIIBRYD) 20 MG TABS Take 2 tablets (40 mg total) by mouth daily. 60 tablet 1   Vitamin D, Ergocalciferol, (DRISDOL) 1.25 MG (50000 UNIT) CAPS capsule Take 50,000 Units by mouth every 7 (seven) days. Friday's     No current facility-administered medications for this visit.    Allergies:   Avandia [rosiglitazone], Avelox [moxifloxacin], Latex, Levaquin [levofloxacin], Omalizumab, Oxycodone, Peach [prunus persica], Potassium-containing compounds, Prednisone, Propoxyphene n-acetaminophen, Tomato, Adhesive [tape], Morphine and codeine, Prozac [fluoxetine hcl], Chantix [varenicline], Citrullus vulgaris, Humira [adalimumab], Zyvox [linezolid], Cefaclor, Flagyl [metronidazole], Keflex [cephalexin],  Promethazine hcl, Septra [sulfamethoxazole-trimethoprim], and Sulfadiazine    Social History:  The patient  reports that she has been smoking cigarettes. She has a 10.00 pack-year smoking history. She has never used smokeless tobacco. She reports that she does not currently use drugs after having used the following drugs: Marijuana. She reports that she does not drink alcohol.   Family History:  The patient's ***family history includes ADD / ADHD in her cousin, daughter, and paternal uncle; Anxiety disorder in her father, maternal aunt, maternal uncle, mother, paternal aunt, and paternal uncle; Breast cancer (age of onset: 25) in her mother; Cancer in her mother; Depression in her father and mother; Drug abuse in her father; Hyperlipidemia in her sister; Hypertension in her father and sister; Seizures in her maternal aunt; Sexual abuse in her mother and sister; Stroke in her paternal uncle.    ROS:  Please see the history of present illness.   Otherwise, review of systems are positive for {NONE DEFAULTED:18576}.   All other systems are reviewed and negative.    PHYSICAL EXAM: VS:  There were no vitals taken for this visit. , BMI There is no height or weight  on file to calculate BMI. GENERAL:  Well appearing HEENT:  Pupils equal round and reactive, fundi not visualized, oral mucosa unremarkable NECK:  No jugular venous distention, waveform within normal limits, carotid upstroke brisk and symmetric, no bruits, no thyromegaly LYMPHATICS:  No cervical, inguinal adenopathy LUNGS:  Clear to auscultation bilaterally BACK:  No CVA tenderness CHEST:  Unremarkable HEART:  PMI not displaced or sustained,S1 and S2 within normal limits, no S3, no S4, no clicks, no rubs, *** murmurs ABD:  Flat, positive bowel sounds normal in frequency in pitch, no bruits, no rebound, no guarding, no midline pulsatile mass, no hepatomegaly, no splenomegaly EXT:  2 plus pulses throughout, no edema, no cyanosis no clubbing SKIN:  No rashes no nodules NEURO:  Cranial nerves II through XII grossly intact, motor grossly intact throughout PSYCH:  Cognitively intact, oriented to person place and time    EKG:  EKG {ACTION; IS/IS ZOX:09604540} ordered today. The ekg ordered today demonstrates ***   Recent Labs: 09/19/2022: ALT 11; BUN 19; Creatinine, Ser 0.65; Hemoglobin 14.4; Platelets 362; Potassium 3.7; Sodium 139    Lipid Panel    Component Value Date/Time   CHOL 193 03/22/2018 0834   TRIG 90.0 03/22/2018 0834   HDL 62.80 03/22/2018 0834   CHOLHDL 3 03/22/2018 0834   VLDL 18.0 03/22/2018 0834   LDLCALC 113 (H) 03/22/2018 0834      Wt Readings from Last 3 Encounters:  09/19/22 202 lb 6.4 oz (91.8 kg)  06/23/22 202 lb (91.6 kg)  06/01/21 222 lb (100.7 kg)      Other studies Reviewed: Additional studies/ records that were reviewed today include: ***. Review of the above records demonstrates:  Please see elsewhere in the note.  ***   ASSESSMENT AND PLAN:  Chest pain:  ***  SVT:  ***   Current medicines are reviewed at length with the patient today.  The patient {ACTIONS; HAS/DOES NOT HAVE:19233} concerns regarding medicines.  The following changes have  been made:  {PLAN; NO CHANGE:13088:s}  Labs/ tests ordered today include: *** No orders of the defined types were placed in this encounter.    Disposition:   FU with ***    Signed, Rollene Rotunda, MD  02/19/2023 8:12 PM    [facility]

## 2023-02-20 ENCOUNTER — Ambulatory Visit: Payer: Medicaid Other | Admitting: Cardiology

## 2023-02-20 DIAGNOSIS — R072 Precordial pain: Secondary | ICD-10-CM

## 2023-02-20 DIAGNOSIS — I471 Supraventricular tachycardia, unspecified: Secondary | ICD-10-CM

## 2023-04-17 ENCOUNTER — Encounter: Payer: Self-pay | Admitting: Cardiovascular Disease

## 2023-04-17 ENCOUNTER — Ambulatory Visit: Payer: Medicaid Other | Attending: Cardiovascular Disease | Admitting: Cardiovascular Disease

## 2023-05-11 ENCOUNTER — Encounter (HOSPITAL_COMMUNITY): Payer: Self-pay

## 2023-05-11 ENCOUNTER — Emergency Department (HOSPITAL_COMMUNITY)
Admission: EM | Admit: 2023-05-11 | Discharge: 2023-05-11 | Discharge status: Against Medical Advice | Payer: Medicaid Other | Attending: Emergency Medicine | Admitting: Emergency Medicine

## 2023-05-11 ENCOUNTER — Emergency Department (HOSPITAL_COMMUNITY): Payer: Medicaid Other

## 2023-05-11 ENCOUNTER — Other Ambulatory Visit: Payer: Self-pay

## 2023-05-11 DIAGNOSIS — L0231 Cutaneous abscess of buttock: Secondary | ICD-10-CM | POA: Diagnosis present

## 2023-05-11 DIAGNOSIS — Z794 Long term (current) use of insulin: Secondary | ICD-10-CM | POA: Diagnosis not present

## 2023-05-11 DIAGNOSIS — E1065 Type 1 diabetes mellitus with hyperglycemia: Secondary | ICD-10-CM | POA: Diagnosis not present

## 2023-05-11 DIAGNOSIS — Z5329 Procedure and treatment not carried out because of patient's decision for other reasons: Secondary | ICD-10-CM | POA: Insufficient documentation

## 2023-05-11 DIAGNOSIS — L0291 Cutaneous abscess, unspecified: Secondary | ICD-10-CM

## 2023-05-11 DIAGNOSIS — Z91040 Latex allergy status: Secondary | ICD-10-CM | POA: Diagnosis not present

## 2023-05-11 DIAGNOSIS — Z7982 Long term (current) use of aspirin: Secondary | ICD-10-CM | POA: Diagnosis not present

## 2023-05-11 LAB — CBC WITH DIFFERENTIAL/PLATELET
Abs Immature Granulocytes: 0.03 10*3/uL (ref 0.00–0.07)
Basophils Absolute: 0.1 10*3/uL (ref 0.0–0.1)
Basophils Relative: 1 %
Eosinophils Absolute: 0.4 10*3/uL (ref 0.0–0.5)
Eosinophils Relative: 5 %
HCT: 43.5 % (ref 36.0–46.0)
Hemoglobin: 14.2 g/dL (ref 12.0–15.0)
Immature Granulocytes: 0 %
Lymphocytes Relative: 33 %
Lymphs Abs: 2.7 10*3/uL (ref 0.7–4.0)
MCH: 31 pg (ref 26.0–34.0)
MCHC: 32.6 g/dL (ref 30.0–36.0)
MCV: 95 fL (ref 80.0–100.0)
Monocytes Absolute: 0.5 10*3/uL (ref 0.1–1.0)
Monocytes Relative: 6 %
Neutro Abs: 4.4 10*3/uL (ref 1.7–7.7)
Neutrophils Relative %: 55 %
Platelets: 370 10*3/uL (ref 150–400)
RBC: 4.58 MIL/uL (ref 3.87–5.11)
RDW: 12.7 % (ref 11.5–15.5)
WBC: 8.2 10*3/uL (ref 4.0–10.5)
nRBC: 0 % (ref 0.0–0.2)

## 2023-05-11 LAB — LACTATE DEHYDROGENASE: LDH: 156 U/L (ref 98–192)

## 2023-05-11 LAB — COMPREHENSIVE METABOLIC PANEL
ALT: 15 U/L (ref 0–44)
AST: 13 U/L — ABNORMAL LOW (ref 15–41)
Albumin: 3.4 g/dL — ABNORMAL LOW (ref 3.5–5.0)
Alkaline Phosphatase: 101 U/L (ref 38–126)
Anion gap: 11 (ref 5–15)
BUN: 11 mg/dL (ref 6–20)
CO2: 22 mmol/L (ref 22–32)
Calcium: 9.4 mg/dL (ref 8.9–10.3)
Chloride: 103 mmol/L (ref 98–111)
Creatinine, Ser: 0.65 mg/dL (ref 0.44–1.00)
GFR, Estimated: >60 mL/min (ref >60)
Glucose, Bld: 155 mg/dL — ABNORMAL HIGH (ref 70–99)
Potassium: 4.3 mmol/L (ref 3.5–5.1)
Sodium: 136 mmol/L (ref 135–145)
Total Bilirubin: 0.6 mg/dL (ref 0.3–1.2)
Total Protein: 6.7 g/dL (ref 6.5–8.1)

## 2023-05-11 LAB — HCG, QUANTITATIVE, PREGNANCY: hCG, Beta Chain, Quant, S: 1 m[IU]/mL (ref <5)

## 2023-05-11 MED ORDER — LIDOCAINE-EPINEPHRINE (PF) 2 %-1:200000 IJ SOLN: 20.0000 mL | Freq: Once | INTRAMUSCULAR | Status: DC

## 2023-05-11 MED ORDER — FENTANYL CITRATE PF 50 MCG/ML IJ SOSY
50.0000 ug | PREFILLED_SYRINGE | Freq: Once | INTRAMUSCULAR | Status: AC
  Administered 2023-05-11: 50 ug via INTRAVENOUS
  Filled 2023-05-11: qty 1

## 2023-05-11 MED ORDER — SODIUM CHLORIDE 0.9 % IV BOLUS
1000.0000 mL | Freq: Once | INTRAVENOUS | Status: AC
  Administered 2023-05-11: 1000 mL via INTRAVENOUS

## 2023-05-11 MED ORDER — IOHEXOL 350 MG/ML SOLN
75.0000 mL | Freq: Once | INTRAVENOUS | Status: AC | PRN
  Administered 2023-05-11: 75 mL via INTRAVENOUS

## 2023-05-11 NOTE — ED Triage Notes (Signed)
Pt states she had hadrinitis flare on left buttocks started early August. Pt states was started on atx from PCP and finished it, but it got bigger and has nausea and chills. Pt states the blood sugar has been staying over 400 and it's been up and down for the past 2wks.

## 2023-05-11 NOTE — ED Notes (Signed)
Patient transported to CT 

## 2023-05-11 NOTE — ED Provider Notes (Cosign Needed)
[place] EMERGENCY DEPARTMENT AT [facility] Provider Note   CSN: [id number] Arrival date & time: 05/11/23  0915     History  No chief complaint on file.   [name] is a 42 y.o. female.  HPI   Patient is a 42 year old female with possible history of hidradenitis and insulin-dependent diabetes, presenting today for concerns of a hidradenitis flare.  She describes an area of swelling and redness to her left buttock.  She states that has been increasing in size over the last 2 to 3 weeks.  She was initially seen by her primary care physician who placed her on a 10-day course of Bactrim.  She notes that it is worsened in pain and size since that time.  She has had no fevers but has felt chilled.  She denies any nausea or vomiting.  She has not noted any drainage from the area.  She has had increased difficulty controlling her blood sugar.  She states that when she has a flare this bad, she will often have to go to the OR for an aggressive cleanout.  Home Medications Prior to Admission medications   Medication Sig Start Date End Date Taking? Authorizing Provider  albuterol (VENTOLIN HFA) 108 (90 Base) MCG/ACT inhaler Inhale into the lungs every 6 (six) hours as needed for wheezing or shortness of breath.    [provider]  aspirin EC 81 MG tablet Take 81 mg by mouth daily. Swallow whole.    [provider]  BAQSIMI TWO PACK 3 MG/DOSE POWD Place 3 mg into both nostrils daily as needed. Low CBG 03/28/21   [provider]  budesonide-formoterol (SYMBICORT) 160-4.5 MCG/ACT inhaler Inhale 2 puffs into the lungs as needed.    [provider]  clonazePAM (KLONOPIN) 0.5 MG tablet Take 0.5 mg by mouth 2 (two) times daily as needed for anxiety.    [provider]  Glucagon, rDNA, (GLUCAGON EMERGENCY) 1 MG KIT Inject into the vein as directed.    [provider]  glucose blood ([employer] TEST STRIPS) test strip 1 each by Other route See  admin instructions.  08/04/16   [provider]  ibuprofen (MOTRIN IB) 200 MG tablet Take 3 tablets (600 mg total) by mouth every 6 (six) hours as needed. 09/19/22   Huel Cote, MD  Insulin Disposable Pump (OMNIPOD 5 G6 PODS, GEN 5,) MISC As directed 12/07/22   Jetty Duhamel D, MD  Insulin Lispro (HUMALOG [place]) Inject into the skin as directed. For Insulin pump  --   basal rate 1.4 units/ hr    [provider]  pantoprazole (PROTONIX) 40 MG tablet Take 40 mg by mouth daily.    [provider]  Vilazodone HCl (VIIBRYD) 20 MG TABS Take 2 tablets (40 mg total) by mouth daily. 11/25/22   Cobos, Rockey Situ, MD  Vitamin D, Ergocalciferol, (DRISDOL) 1.25 MG (50000 UNIT) CAPS capsule Take 50,000 Units by mouth every 7 (seven) days. Friday's    [provider]      Allergies    Avandia [rosiglitazone], Avelox [moxifloxacin], Latex, Levaquin [levofloxacin], Omalizumab, Oxycodone, Peach [prunus persica], Potassium-containing compounds, Prednisone, Propoxyphene n-acetaminophen, Tomato, Adhesive [tape], Morphine and codeine, Prozac [fluoxetine hcl], Chantix [varenicline], Citrullus vulgaris, Humira [adalimumab], Zyvox [linezolid], Cefaclor, Flagyl [metronidazole], Keflex [cephalexin], Promethazine hcl, Septra [sulfamethoxazole-trimethoprim], and Sulfadiazine    Review of Systems   Review of Systems Negative except for as noted above in the HPI  Physical Exam Updated Vital Signs BP 104/66  Pulse 92   Temp 98.2 F (36.8 C)   Resp 17   Ht 5' (1.524 m)   Wt 91.8 kg   SpO2 100%   BMI 39.53 kg/m  Physical Exam Vitals and nursing note reviewed.  Constitutional:      General: She is not in acute distress.    Appearance: She is well-developed.  HENT:     Head: Normocephalic and atraumatic.  Eyes:     Conjunctiva/sclera: Conjunctivae normal.  Cardiovascular:     Rate and Rhythm: Normal rate and regular rhythm.     Heart sounds: No murmur heard. Pulmonary:      Effort: Pulmonary effort is normal. No respiratory distress.     Breath sounds: Normal breath sounds.  Abdominal:     General: There is no distension.     Palpations: Abdomen is soft.     Tenderness: There is no abdominal tenderness. There is no guarding.  Genitourinary:    Comments: Left buttock with 7 x 5 cm area of erythema with fluctuance present over the mid buttock without any extension towards the perianal area.  No drainage seen.  Picture placed in the chart Musculoskeletal:        General: No swelling.     Cervical back: Neck supple.  Skin:    General: Skin is warm and dry.     Capillary Refill: Capillary refill takes less than 2 seconds.  Neurological:     Mental Status: She is alert.  Psychiatric:        Mood and Affect: Mood normal.     ED Results / Procedures / Treatments   Labs (all labs ordered are listed, but only abnormal results are displayed) Labs Reviewed  COMPREHENSIVE METABOLIC PANEL - Abnormal; Notable for the following components:      Result Value   Glucose, Bld 155 (*)    Albumin 3.4 (*)    AST 13 (*)    All other components within normal limits  CBC WITH DIFFERENTIAL/PLATELET  LACTATE DEHYDROGENASE  HCG, QUANTITATIVE, PREGNANCY    EKG None  Radiology No results found.    Medications Ordered in ED Medications  sodium chloride 0.9 % bolus 1,000 mL (0 mLs Intravenous Stopped 05/11/23 1545)  fentaNYL (SUBLIMAZE) injection 50 mcg (50 mcg Intravenous Given 05/11/23 1628)  iohexol (OMNIPAQUE) 350 MG/ML injection 75 mL (75 mLs Intravenous Contrast Given 05/11/23 1619)    ED Course/ Medical Decision Making/ A&P Clinical Course as of 05/11/23 1716  Mon May 11, 2023  1614 St. Hx HS. Large L buttock abscess. C/f hyperglycemia, but CBG fine here. 7x5 abscess. Getting CT to further evaluate, but not close to rectum. Failed OP Abx. Needs to come in. Have general surgery assess.  [WL]    Clinical Course User Index [WL] Dyanne Iha, MD                                 Medical Decision Making Problems Addressed: Abscess: complicated acute illness or injury  Amount and/or Complexity of Data Reviewed External Data Reviewed: notes. Labs: ordered. Radiology: ordered and independent interpretation performed.  Risk Prescription drug management.    Patient is a 42 year old female with possible history of hidradenitis and insulin-dependent diabetes, presenting today for concerns of a hidradenitis flare.  On exam, patient is alert and oriented.  She is in regular rate and rhythm.  Lungs are clear to auscultation.  She does have a 7 x  5 cm area of likely abscess to her left buttock with fluctuance throughout and slight induration noted around the edges.  She does not have any extension towards the perianal area.  Pictures placed in the chart.  Presentation most consistent with abscess, hidradenitis, cellulitis.  Given his location, lower concern for perianal or perirectal abscess.  Will evaluate for any deeper extension into the gluteal space.  Will also evaluate for hyperglycemia and electrolyte derangements.  Patient started on 1 L IV fluids.  Lab work reveals blood sugar of 155.  WBC count is normal at 8.2.  Patient is given 50 of fentanyl IV.  Will obtain CT the abdomen pelvis to verify no further extension.  Dispo pending results of imaging.  She is hemodynamically stable at this time.  Transfer of care to Dr. Mcneil Sober  { Final Clinical Impression(s) / ED Diagnoses Final diagnoses:  Abscess    Rx / DC Orders ED Discharge Orders     None         Rhys Martini, DO 05/11/23 1716

## 2023-05-11 NOTE — ED Provider Notes (Signed)
MSE note.  Patient has a history of diabetes and hidradenitis.  Patient complains of swelling and pain to her left buttocks.  She has been treated with Bactrim.  Physical exam.  Patient in mild distress.  Labs normal.  Left buttock shows large abscess.  Lungs clear abdomen nontender.  Labs ordered CT scan ordered to evaluate abscess.   Bethann Berkshire, MD 05/11/23 1042

## 2023-05-11 NOTE — ED Notes (Signed)
Patient provided mepilex for right buttock and IV removed. Patient advised about return precautions and advised that her condition could get worse up to and including death. Patient verbalized understanding and return precautions to this RN. Patient ambulated to exit with family.

## 2023-05-14 ENCOUNTER — Encounter: Payer: Self-pay | Admitting: Hematology & Oncology

## 2023-05-14 ENCOUNTER — Inpatient Hospital Stay: Payer: Medicaid Other | Attending: Hematology & Oncology | Admitting: Hematology & Oncology

## 2023-05-14 ENCOUNTER — Inpatient Hospital Stay: Payer: Medicaid Other

## 2023-05-14 VITALS — BP 123/68 | HR 90 | Temp 98.2°F | Resp 20 | Ht 65.0 in | Wt 205.0 lb

## 2023-05-14 DIAGNOSIS — Z794 Long term (current) use of insulin: Secondary | ICD-10-CM | POA: Diagnosis not present

## 2023-05-14 DIAGNOSIS — E282 Polycystic ovarian syndrome: Secondary | ICD-10-CM | POA: Diagnosis not present

## 2023-05-14 DIAGNOSIS — R531 Weakness: Secondary | ICD-10-CM | POA: Diagnosis not present

## 2023-05-14 DIAGNOSIS — D6851 Activated protein C resistance: Secondary | ICD-10-CM

## 2023-05-14 DIAGNOSIS — F17210 Nicotine dependence, cigarettes, uncomplicated: Secondary | ICD-10-CM | POA: Diagnosis not present

## 2023-05-14 DIAGNOSIS — G459 Transient cerebral ischemic attack, unspecified: Secondary | ICD-10-CM

## 2023-05-14 DIAGNOSIS — E109 Type 1 diabetes mellitus without complications: Secondary | ICD-10-CM | POA: Insufficient documentation

## 2023-05-14 DIAGNOSIS — Z803 Family history of malignant neoplasm of breast: Secondary | ICD-10-CM | POA: Diagnosis not present

## 2023-05-14 DIAGNOSIS — Z7901 Long term (current) use of anticoagulants: Secondary | ICD-10-CM | POA: Insufficient documentation

## 2023-05-14 MED ORDER — APIXABAN 5 MG PO TABS: 5.0000 mg | ORAL_TABLET | Freq: Two times a day (BID) | ORAL | 6 refills | Status: AC

## 2023-05-14 NOTE — Progress Notes (Signed)
Referral MD  Reason for Referral: Factor V Leiden mutation-heterozygous  Chief Complaint  Patient presents with   New Patient (Initial Visit)    "I have Factor V"  : I was told I need to be on blood thinner.  HPI: [name] is a very nice 42 year old white female.  She has multiple medical problems.  She has diabetes.  She has insulin-dependent diabetes.  She apparently was hospitalized recently because her insulin pump was not working and her blood sugar got incredibly high.  She had what appeared to be some kind of CNS event.  She was evaluated by cardiology.  She was found to have a think right-to-left shunt.  I will know she had a PFO.  She did not have an actual cerebrovascular infarct.  I think she had MRI.  However, she was told that because of this shunt and the fact that she has factor V Leiden mutation, as she needed to be on blood thinner so she would not have a stroke.  She said she has had an episode of thromboembolism.  She had 1 while she was with her second pregnancy.  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  She had 1 successful pregnancy.  This was with a 77 year old daughter.  She said that she had a blood clot in the left leg.  She was put on Lovenox during pregnancy.  6 weeks after her delivery, she was stopped on Lovenox and no further blood thinner was given.  She does have polycystic ovaries.  She has an assortment of other health issues.  She is still working.  She works for [employer].  She does have an insulin pump right now.  She says that the factor V Leiden is on her father side of the family.  She does smoke.  She is trying to cut back on smoking.  She does not drink alcoholic beverages.  She does have an IUD in.  She has limited weakness I think on her left side.  Overall, I would say that her performance status is probably ECOG 1.    Past Medical History:  Diagnosis Date   Asthma    followed by pcp   Bipolar 2 disorder (HCC)    Chronic  post-traumatic stress disorder (PTSD)    Factor V Leiden mutation (HCC) 04/2021   per pt had blood done by gyn dr Senaida Ores in her office  ;  only one episode DVT LLE 08/ 2022   Fibromyalgia    folllowed by pcp   GAD (generalized anxiety disorder)    GERD (gastroesophageal reflux disease)    no current meds.   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   History of diabetic ketoacidosis    hx multiple admission's for dka (last admission in care everywhere 07-08-2022 @ [facility] w/ mild DKA ,  right buttock abscess, and hyperglycemia   History of DVT of lower extremity    druing pregnancy   last one 04-29-2021  nonoccluded dvt LLE (per was 3 clots in same leg)   History of pregnancy induced hypertension    preeclampsia   Hx MRSA infection    Hx-TIA (transient ischemic attack) 09/2017   no residual's   Insulin dependent type 1  diabetes mellitus (HCC) 1997   followed by [facility] --- liliana pitu NP;  (uncontrolled)  uses dexcom and has omnipod insulin pump   (09-18-2022  fasting average blood sugar 80-220)   Insulin pump in place    omnipod   MDD (major depressive disorder)    hx   Menorrhagia    Migraine    Panic disorder    Paroxysmal SVT (supraventricular tachycardia) 06/2017   (09-18-2022  per pt no issues since 2018)   PCOS (polycystic ovarian syndrome)    Vitamin D insufficiency   :   Past Surgical History:  Procedure Laterality Date   CESAREAN SECTION N/A 06/02/2021   Procedure: CESAREAN SECTION;  Surgeon: Huel Cote, MD;  Location: [facility];  Service: Obstetrics;  Laterality: N/A;   W/  BILATERAL SALPINGECTOMY   CHALAZION EXCISION Left    age 73  (stye)   CHOLECYSTECTOMY, LAPAROSCOPIC  12/02/2006   @[facility]  by dr Sheron Nightingale   COLONOSCOPY  06/16/2017   DILATION  AND EVACUATION  02/14/2007   @[facility]  by dr Lily Peer   ESOPHAGOGASTRODUODENOSCOPY  11/17/2011   Procedure: ESOPHAGOGASTRODUODENOSCOPY (EGD);  Surgeon: Shirley Friar, MD;  Location: [facility];  Service: Endoscopy;  Laterality: N/A;   EXCISION OF BREAST BIOPSY Right    age 89  and reexcision for cancerous skin tag   (benign breast bx)   HYDRADENITIS EXCISION  04/30/2012   Procedure: EXCISION HYDRADENITIS GROIN;  Surgeon: Shelly Rubenstein, MD;  Location: [facility];  Service: General;  Laterality: Left;  wide excision hidradenitis bilateral thighs and Left groin   HYDRADENITIS EXCISION Left 01/13/2014   Procedure: WIDE EXCISION HIDRADENITIS LEFT AXILLA;  Surgeon: Shelly Rubenstein, MD;  Location: [facility];  Service: General;  Laterality: Left;   HYSTEROSCOPY WITH D & C N/A 09/19/2022   Procedure: DILATATION AND CURETTAGE Melton Krebs WITH MYOSURE;  Surgeon: Huel Cote, MD;  Location: [facility];  Service: Gynecology;  Laterality: N/A;   INCISION AND DRAINAGE ABSCESS Right 03/17/2019   Procedure: INCISION AND DRAINAGE RIGHT THIGH ABSCESS;  Surgeon: Harriette Bouillon, MD;  Location: [facility] OR;  Service: General;  Laterality: Right;   INTRAUTERINE DEVICE (IUD) INSERTION N/A 09/19/2022   Procedure: INTRAUTERINE DEVICE (IUD) INSERTION;  Surgeon: Huel Cote, MD;  Location: [facility] [place] [place];  Service: Gynecology;  Laterality: N/A;  Mirena   IRRIGATION AND DEBRIDEMENT ABSCESS Right 06/02/2014   Procedure: IRRIGATION AND DEBRIDEMENT ABSCESS;  Surgeon: Abigail Miyamoto, MD;  Location: [facility] OR;  Service: General;  Laterality: Right;   IRRIGATION AND DEBRIDEMENT ABSCESS Left 09/23/2014   Procedure: IRRIGATION AND DEBRIDEMENT ABSCESS/LEFT THIGH;  Surgeon: Violeta Gelinas, MD;  Location: [facility] OR;  Service: General;  Laterality: Left;   LEFT HEART CATHETERIZATION WITH CORONARY ANGIOGRAM N/A 07/14/2014   Procedure: LEFT HEART CATHETERIZATION WITH CORONARY  ANGIOGRAM;  Surgeon: Araminta Zorn M [place], MD;  Location: [facility] CATH LAB;  Service: Cardiovascular;  Laterality: N/A;   TAYLOR BUNIONECTOMY Left 2006   TONSILLECTOMY AND ADENOIDECTOMY     age 22   WISDOM TOOTH EXTRACTION    :   Current Outpatient Medications:    acetaminophen (TYLENOL) 325 MG tablet, Take 650 mg by mouth every 4 (four) hours as needed., Disp: , Rfl:    apixaban (ELIQUIS) 5 MG TABS tablet, Take 1 tablet (5 mg total) by mouth 2 (two) times daily., Disp: 60 tablet, Rfl: 6   HUMALOG 100 UNIT/ML injection, SMARTSIG:80 Unit(s) SUB-Q Daily, Disp: , Rfl:    Insulin Disposable  Pump (OMNIPOD 5 G6 PODS, GEN 5,) MISC, As directed, Disp: 30 each, Rfl: PRN   Insulin Lispro (HUMALOG [place]), Inject into the skin as directed. For Insulin pump  --   basal rate 1.4 units/ hr, Disp: , Rfl:    magnesium oxide (MAG-OX) 400 MG tablet, Take 400 mg by mouth daily., Disp: , Rfl:    pantoprazole (PROTONIX) 40 MG tablet, Take 40 mg by mouth daily., Disp: , Rfl:    SUMAtriptan (IMITREX) 100 MG tablet, Take 100 mg by mouth every 2 (two) hours as needed., Disp: , Rfl:    Ubrogepant (UBRELVY) 100 MG TABS, 100 mg daily as needed., Disp: , Rfl:    albuterol (VENTOLIN HFA) 108 (90 Base) MCG/ACT inhaler, Inhale into the lungs every 6 (six) hours as needed for wheezing or shortness of breath. (Patient not taking: Reported on 05/14/2023), Disp: , Rfl:    budesonide-formoterol (SYMBICORT) 160-4.5 MCG/ACT inhaler, Inhale 2 puffs into the lungs as needed. (Patient not taking: Reported on 05/14/2023), Disp: , Rfl:    clonazePAM (KLONOPIN) 0.5 MG tablet, Take 0.5 mg by mouth 2 (two) times daily as needed for anxiety. (Patient not taking: Reported on 05/14/2023), Disp: , Rfl:    Glucagon, rDNA, (GLUCAGON EMERGENCY) 1 MG KIT, Inject into the vein as directed. (Patient not taking: Reported on 05/14/2023), Disp: , Rfl: :  :   Allergies  Allergen Reactions   Avandia [Rosiglitazone] Swelling    Swelling of face and legs   Avelox  [Moxifloxacin] Shortness Of Breath and Rash   Latex Hives, Shortness Of Breath and Rash   Levaquin [Levofloxacin] Shortness Of Breath and Rash   Omalizumab Anaphylaxis and Rash    (Xolair)    Oxycodone Shortness Of Breath, Swelling and Rash    NORCO/ VICODIN OK   Peach [Prunus Persica] Hives and Shortness Of Breath   Potassium-Containing Compounds Other (See Comments)    IV ROUTE - CAUSES VEINS TO COLLAPS; Reports that it is undiluted K only   Prednisone Other (See Comments)    SEVERE ELEVATION OF BLOOD SUGAR. Able to tolerate 40 mg   Propoxyphene N-Acetaminophen Swelling    SWELLING OF FACE AND THROAT   Tomato Anaphylaxis    Raw tomato's //  cooked tomato's ok   Adhesive [Tape] Hives, Itching and Rash   Morphine And Codeine Other (See Comments)    Causes hallucinations   Prozac [Fluoxetine Hcl] Other (See Comments)    Made her very aggressive    Chantix [Varenicline]     dreams   Citrullus Vulgaris Nausea And Vomiting    Facial swelling   Humira [Adalimumab]     Does not remember    Zyvox [Linezolid] Swelling   Cefaclor Rash   Flagyl [Metronidazole] Rash   Keflex [Cephalexin] Diarrhea and Rash    REACTION: severe migraine   Promethazine Hcl Other (See Comments)    IV ROUTE ONLY - JITTERY FEELING. Patient reports that it is mild and she has used promethazine since then  PO tablet ok   Septra [Sulfamethoxazole-Trimethoprim] Rash   Sulfadiazine Rash  :   Family History  Problem Relation Age of Onset   Cancer Mother    Anxiety disorder Mother    Depression Mother    Sexual abuse Mother    Breast cancer Mother 85   Hyperlipidemia Sister    Hypertension Sister    Sexual abuse Sister    Hypertension Father    Anxiety disorder Father    Depression Father  Drug abuse Father    Stroke Paternal Uncle    ADD / ADHD Paternal Uncle    Anxiety disorder Paternal Uncle    Anxiety disorder Maternal Aunt    Seizures Maternal Aunt    Anxiety disorder Paternal Aunt     Anxiety disorder Maternal Uncle    ADD / ADHD Cousin    ADD / ADHD Daughter   :   Social History   Socioeconomic History   Marital status: Married    Spouse name: Margretta Ditty   Number of children: Not on file   Years of education: Not on file   Highest education level: Not on file  Occupational History   Not on file  Tobacco Use   Smoking status: Every Day    Current packs/day: 0.50    Average packs/day: 0.5 packs/day for 20.0 years (10.0 ttl pk-yrs)    Types: Cigarettes   Smokeless tobacco: Never   Tobacco comments:    09-18-2022  per pt had stopped smoking 09-22-2020 but has restart approx 08/ 2023 1/2 ppd,  total average smoking 20 yrs  Vaping Use   Vaping status: Never Used  Substance and Sexual Activity   Alcohol use: No    Alcohol/week: 0.0 standard drinks of alcohol   Drug use: Not Currently    Types: Marijuana    Comment: 2020   Sexual activity: Yes    Partners: Male    Birth control/protection: Surgical    Comment: tubal w/ c/s 06-02-2021  Other Topics Concern   Not on file  Social History Narrative   Not on file   Social Determinants of Health   Financial Resource Strain: High Risk (07/29/2022)   Received from [employer] [facility], [facility]   Overall Financial Resource Strain (CARDIA)    Difficulty of Paying Living Expenses: Hard  Food Insecurity: Low Risk  (04/19/2023)   Received from [facility]   Food vital sign    Within the past 12 months, you worried that your food would run out before you got money to buy more: Never true    Within the past 12 months, the food you bought just didn't last and you didn't have money to get more. : Never true  Transportation Needs: Not on file (04/19/2023)  Physical Activity: Insufficiently Active (07/29/2022)   Received from [facility], [facility]   Exercise Vital Sign    Days of Exercise per Week: 4 days    Minutes of Exercise per Session: 30 min  Stress: Stress Concern Present (07/29/2022)   Received from  [facility], [facility]   [employer] of Occupational [facility] - Occupational Stress Questionnaire    Feeling of Stress : Very much  Social Connections: Moderately Integrated (07/29/2022)   Received from [facility], [facility]   Social Network    How would you rate your social network (family, work, friends)?: Adequate participation with social networks  Intimate Partner Violence: Not At Risk (04/02/2023)   Received from [facility]   HITS    Over the last 12 months how often did your partner physically hurt you?: 1    Over the last 12 months how often did your partner insult you or talk down to you?: 1    Over the last 12 months how often did your partner threaten you with physical harm?: 1    Over the last 12 months how often did your partner scream or curse at you?: 1  :  Review of Systems  Constitutional: Negative.  HENT: Negative.    Eyes: Negative.   Respiratory: Negative.    Cardiovascular: Negative.   Gastrointestinal: Negative.   Genitourinary: Negative.   Musculoskeletal: Negative.   Skin: Negative.   Neurological:  Positive for focal weakness.  Endo/Heme/Allergies: Negative.   Psychiatric/Behavioral: Negative.       Exam: Her vital signs show temperature of 98.2.  Pulse 90.  Blood pressure 123/68.  Weight is 205 pounds.  @IPVITALS @ Physical Exam Vitals reviewed.  HENT:     Head: Normocephalic and atraumatic.  Eyes:     Pupils: Pupils are equal, round, and reactive to light.  Cardiovascular:     Rate and Rhythm: Normal rate and regular rhythm.     Heart sounds: Normal heart sounds.     Comments: Cardiac exam shows a regular rate and rhythm with a normal S1 and S2.  She has no murmurs, rubs or bruits. Pulmonary:     Effort: Pulmonary effort is normal.     Breath sounds: Normal breath sounds.  Abdominal:     General: Bowel sounds are normal.     Palpations: Abdomen is soft.  Musculoskeletal:        General: No tenderness or deformity. Normal  range of motion.     Cervical back: Normal range of motion.  Lymphadenopathy:     Cervical: No cervical adenopathy.  Skin:    General: Skin is warm and dry.     Findings: No erythema or rash.  Neurological:     Mental Status: She is alert and oriented to person, place, and time.     Comments: Neurological exam shows some slight weakness over on the left side.  Psychiatric:        Behavior: Behavior normal.        Thought Content: Thought content normal.        Judgment: Judgment normal.      No results for input(s): "WBC", "HGB", "HCT", "PLT" in the last 72 hours. No results for input(s): "NA", "K", "CL", "CO2", "GLUCOSE", "BUN", "CREATININE", "CALCIUM" in the last 72 hours.  Blood smear review: None  Pathology: None    Assessment and Plan: [name] is a very charming 42 year old white female.  She has a very complicated history.  She has diabetes.  She is insulin-dependent patient has a insulin pump.  She has apparently a PFO.  She said that cardiology does not want to close this unless she does have an actual cerebrovascular event.  We are trying to prevent this from happening.  I think would be reasonable to have her on anticoagulation because of the factor V Leiden.  We will go ahead and put her on Eliquis.  Will not load her up.  We will just hold off at 5 mg p.o. twice daily.  I probably would keep her on this for 6 months and then maybe switch to a maintenance type dose.  I think it would be reasonable to recheck a Doppler of her left leg to make sure that this thrombus is gone.  I think she takes baby aspirin.  She can certainly stop this.  I know she is incredibly complicated with her history.  I know she is incredibly motivated.  She has to children that she has to take care of.  I would like to get her back to see [place] probably in about 2 months or so just to make sure everything is going okay.

## 2023-05-29 ENCOUNTER — Ambulatory Visit (HOSPITAL_BASED_OUTPATIENT_CLINIC_OR_DEPARTMENT_OTHER)
Admission: RE | Admit: 2023-05-29 | Discharge: 2023-05-29 | Disposition: A | Discharge status: Home / Self Care | Payer: Medicaid Other | Source: Ambulatory Visit | Attending: Hematology & Oncology | Admitting: Hematology & Oncology

## 2023-05-29 DIAGNOSIS — G459 Transient cerebral ischemic attack, unspecified: Secondary | ICD-10-CM | POA: Diagnosis present

## 2023-05-29 DIAGNOSIS — D6851 Activated protein C resistance: Secondary | ICD-10-CM | POA: Insufficient documentation

## 2023-06-25 ENCOUNTER — Inpatient Hospital Stay: Payer: Medicaid Other | Attending: Hematology & Oncology

## 2023-06-25 ENCOUNTER — Inpatient Hospital Stay: Payer: Medicaid Other | Admitting: Hematology & Oncology

## 2023-06-25 ENCOUNTER — Other Ambulatory Visit: Payer: Self-pay

## 2023-06-25 ENCOUNTER — Encounter: Payer: Self-pay | Admitting: Hematology & Oncology

## 2023-06-25 VITALS — BP 120/67 | HR 86 | Temp 98.3°F | Resp 18 | Ht 65.0 in | Wt 207.0 lb

## 2023-06-25 DIAGNOSIS — D6851 Activated protein C resistance: Secondary | ICD-10-CM | POA: Insufficient documentation

## 2023-06-25 DIAGNOSIS — Q2112 Patent foramen ovale: Secondary | ICD-10-CM | POA: Diagnosis not present

## 2023-06-25 DIAGNOSIS — Z794 Long term (current) use of insulin: Secondary | ICD-10-CM | POA: Diagnosis not present

## 2023-06-25 DIAGNOSIS — E10311 Type 1 diabetes mellitus with unspecified diabetic retinopathy with macular edema: Secondary | ICD-10-CM

## 2023-06-25 DIAGNOSIS — E119 Type 2 diabetes mellitus without complications: Secondary | ICD-10-CM | POA: Diagnosis not present

## 2023-06-25 DIAGNOSIS — Z9641 Presence of insulin pump (external) (internal): Secondary | ICD-10-CM | POA: Diagnosis not present

## 2023-06-25 DIAGNOSIS — Z7901 Long term (current) use of anticoagulants: Secondary | ICD-10-CM | POA: Diagnosis not present

## 2023-06-25 DIAGNOSIS — G459 Transient cerebral ischemic attack, unspecified: Secondary | ICD-10-CM

## 2023-06-25 LAB — CMP (CANCER CENTER ONLY)
ALT: 9 U/L (ref 0–44)
AST: 10 U/L — ABNORMAL LOW (ref 15–41)
Albumin: 3.7 g/dL (ref 3.5–5.0)
Alkaline Phosphatase: 69 U/L (ref 38–126)
Anion gap: 5 (ref 5–15)
BUN: 16 mg/dL (ref 6–20)
CO2: 29 mmol/L (ref 22–32)
Calcium: 8.9 mg/dL (ref 8.9–10.3)
Chloride: 101 mmol/L (ref 98–111)
Creatinine: 0.79 mg/dL (ref 0.44–1.00)
GFR, Estimated: >60 mL/min (ref >60)
Glucose, Bld: 389 mg/dL — ABNORMAL HIGH (ref 70–99)
Potassium: 4.7 mmol/L (ref 3.5–5.1)
Sodium: 135 mmol/L (ref 135–145)
Total Bilirubin: 0.2 mg/dL — ABNORMAL LOW (ref 0.3–1.2)
Total Protein: 6.4 g/dL — ABNORMAL LOW (ref 6.5–8.1)

## 2023-06-25 LAB — CBC WITH DIFFERENTIAL (CANCER CENTER ONLY)
Abs Immature Granulocytes: 0.04 10*3/uL (ref 0.00–0.07)
Basophils Absolute: 0.1 10*3/uL (ref 0.0–0.1)
Basophils Relative: 1 %
Eosinophils Absolute: 0.3 10*3/uL (ref 0.0–0.5)
Eosinophils Relative: 3 %
HCT: 41.5 % (ref 36.0–46.0)
Hemoglobin: 13.7 g/dL (ref 12.0–15.0)
Immature Granulocytes: 0 %
Lymphocytes Relative: 34 %
Lymphs Abs: 3.1 10*3/uL (ref 0.7–4.0)
MCH: 31.5 pg (ref 26.0–34.0)
MCHC: 33 g/dL (ref 30.0–36.0)
MCV: 95.4 fL (ref 80.0–100.0)
Monocytes Absolute: 0.6 10*3/uL (ref 0.1–1.0)
Monocytes Relative: 7 %
Neutro Abs: 5.1 10*3/uL (ref 1.7–7.7)
Neutrophils Relative %: 55 %
Platelet Count: 365 10*3/uL (ref 150–400)
RBC: 4.35 MIL/uL (ref 3.87–5.11)
RDW: 12.2 % (ref 11.5–15.5)
WBC Count: 9.1 10*3/uL (ref 4.0–10.5)
nRBC: 0 % (ref 0.0–0.2)

## 2023-06-25 NOTE — Progress Notes (Signed)
Hematology and Oncology Follow Up Visit  [name] [id number] 1981/03/02 42 y.o. 06/25/2023   Principle Diagnosis:  Factor V Leiden mutation-heterozygous  Current Therapy:   Eliquis 5 mg p.o. twice daily     Interim History:  [name] is back for follow-up.  This is her second office visit.  We first saw her back in August.  At that time, she came in because of the past history of the heterozygous factor V Leiden mutation.  She has a known PFO.  However, cardiology does not wish to fix this at the present time.  She has had a past history of blood clot in the left leg during pregnancy.  She was put on Lovenox for 6 weeks after her delivery.  She has diabetes.  She has an insulin pump.  She is still working.  She is quite busy at work.  I think she is still smoking a little bit.  She has had no problems with cough.  However, recently, she did have both influenza and COVID.  She was treated with antivirals for both.  She does feel little bit tired.  She has had no change in bowel or bladder habits.  There has been no leg swelling.  She has had no rashes.  Overall, I would say that her performance status is probably ECOG 1.    Medications:  Current Outpatient Medications:    acetaminophen (TYLENOL) 325 MG tablet, Take 650 mg by mouth every 4 (four) hours as needed., Disp: , Rfl:    albuterol (VENTOLIN HFA) 108 (90 Base) MCG/ACT inhaler, Inhale into the lungs every 6 (six) hours as needed for wheezing or shortness of breath. (Patient not taking: Reported on 05/14/2023), Disp: , Rfl:    apixaban (ELIQUIS) 5 MG TABS tablet, Take 1 tablet (5 mg total) by mouth 2 (two) times daily., Disp: 60 tablet, Rfl: 6   budesonide-formoterol (SYMBICORT) 160-4.5 MCG/ACT inhaler, Inhale 2 puffs into the lungs as needed. (Patient not taking: Reported on 05/14/2023), Disp: , Rfl:    clonazePAM (KLONOPIN) 0.5 MG tablet, Take 0.5 mg by mouth 2 (two) times daily as needed for anxiety. (Patient not  taking: Reported on 05/14/2023), Disp: , Rfl:    Glucagon, rDNA, (GLUCAGON EMERGENCY) 1 MG KIT, Inject into the vein as directed. (Patient not taking: Reported on 05/14/2023), Disp: , Rfl:    HUMALOG 100 UNIT/ML injection, SMARTSIG:80 Unit(s) SUB-Q Daily, Disp: , Rfl:    Insulin Disposable Pump (OMNIPOD 5 G6 PODS, GEN 5,) MISC, As directed, Disp: 30 each, Rfl: PRN   Insulin Lispro (HUMALOG [place]), Inject into the skin as directed. For Insulin pump  --   basal rate 1.4 units/ hr, Disp: , Rfl:    magnesium oxide (MAG-OX) 400 MG tablet, Take 400 mg by mouth daily., Disp: , Rfl:    pantoprazole (PROTONIX) 40 MG tablet, Take 40 mg by mouth daily., Disp: , Rfl:    SUMAtriptan (IMITREX) 100 MG tablet, Take 100 mg by mouth every 2 (two) hours as needed., Disp: , Rfl:    Ubrogepant (UBRELVY) 100 MG TABS, 100 mg daily as needed., Disp: , Rfl:   Allergies:  Allergies  Allergen Reactions   Avandia [Rosiglitazone] Swelling    Swelling of face and legs   Avelox [Moxifloxacin] Shortness Of Breath and Rash   Latex Hives, Shortness Of Breath and Rash   Levaquin [Levofloxacin] Shortness Of Breath and Rash   Omalizumab Anaphylaxis and Rash    (Xolair)    Oxycodone Shortness  Of Breath, Swelling and Rash    NORCO/ VICODIN OK   Peach [Prunus Persica] Hives and Shortness Of Breath   Potassium-Containing Compounds Other (See Comments)    IV ROUTE - CAUSES VEINS TO COLLAPS; Reports that it is undiluted K only   Prednisone Other (See Comments)    SEVERE ELEVATION OF BLOOD SUGAR. Able to tolerate 40 mg   Propoxyphene N-Acetaminophen Swelling    SWELLING OF FACE AND THROAT   Tomato Anaphylaxis    Raw tomato's //  cooked tomato's ok   Adhesive [Tape] Hives, Itching and Rash   Morphine And Codeine Other (See Comments)    Causes hallucinations   Prozac [Fluoxetine Hcl] Other (See Comments)    Made her very aggressive    Chantix [Varenicline]     dreams   Citrullus Vulgaris Nausea And Vomiting    Facial swelling    Humira [Adalimumab]     Does not remember    Zyvox [Linezolid] Swelling   Cefaclor Rash   Flagyl [Metronidazole] Rash   Keflex [Cephalexin] Diarrhea and Rash    REACTION: severe migraine   Promethazine Hcl Other (See Comments)    IV ROUTE ONLY - JITTERY FEELING. Patient reports that it is mild and she has used promethazine since then  PO tablet ok   Septra [Sulfamethoxazole-Trimethoprim] Rash   Sulfadiazine Rash    Past Medical History, Surgical history, Social history, and Family History were reviewed and updated.  Review of Systems: Review of Systems  Constitutional:  Positive for fatigue.  HENT:  Negative.    Eyes: Negative.   Respiratory: Negative.    Cardiovascular: Negative.   Gastrointestinal: Negative.   Endocrine: Negative.   Genitourinary: Negative.    Musculoskeletal: Negative.   Skin: Negative.   Neurological: Negative.   Hematological: Negative.   Psychiatric/Behavioral: Negative.      Physical Exam:  height is 5\' 5"  (1.651 m) and weight is 207 lb (93.9 kg). Her oral temperature is 98.3 F (36.8 C). Her blood pressure is 120/67 and her pulse is 86. Her respiration is 18 and oxygen saturation is 100%.   Wt Readings from Last 3 Encounters:  06/25/23 207 lb (93.9 kg)  05/14/23 205 lb (93 kg)  05/11/23 202 lb 6.1 oz (91.8 kg)    Physical Exam Vitals reviewed.  HENT:     Head: Normocephalic and atraumatic.  Eyes:     Pupils: Pupils are equal, round, and reactive to light.  Cardiovascular:     Rate and Rhythm: Normal rate and regular rhythm.     Heart sounds: Normal heart sounds.  Pulmonary:     Effort: Pulmonary effort is normal.     Breath sounds: Normal breath sounds.  Abdominal:     General: Bowel sounds are normal.     Palpations: Abdomen is soft.  Musculoskeletal:        General: No tenderness or deformity. Normal range of motion.     Cervical back: Normal range of motion.  Lymphadenopathy:     Cervical: No cervical adenopathy.  Skin:     General: Skin is warm and dry.     Findings: No erythema or rash.  Neurological:     Mental Status: She is alert and oriented to person, place, and time.  Psychiatric:        Behavior: Behavior normal.        Thought Content: Thought content normal.        Judgment: Judgment normal.    Lab Results  Component  Value Date   WBC 9.1 06/25/2023   HGB 13.7 06/25/2023   HCT 41.5 06/25/2023   MCV 95.4 06/25/2023   PLT 365 06/25/2023     Chemistry      Component Value Date/Time   NA 135 06/25/2023 1517   NA 142 04/04/2019 1632   K 4.7 06/25/2023 1517   CL 101 06/25/2023 1517   CO2 29 06/25/2023 1517   BUN 16 06/25/2023 1517   BUN 16 04/04/2019 1632   CREATININE 0.79 06/25/2023 1517      Component Value Date/Time   CALCIUM 8.9 06/25/2023 1517   CALCIUM 8.9 08/04/2008 2210   ALKPHOS 69 06/25/2023 1517   AST 10 (L) 06/25/2023 1517   ALT 9 06/25/2023 1517   BILITOT 0.2 (L) 06/25/2023 1517      Impression and Plan: [name] is a very charming 42 year old white female.  She has the factor V Leiden mutation.  She is heterozygous for this.  She is on lifelong Eliquis.  Of note, she has had a thrombus in the left leg.  This was during her pregnancy.  She has had 4 miscarriages.  For right now, I do not see any problems with respect to thromboembolism.  I think her biggest problem is going be the diabetes.  Her blood sugar today is quite high.  Overall, from my point of view, I think we can probably get her back in 6 months.  She was told to come back to see [place] sooner if she has any issues.   Josph Macho, MD 10/3/20244:31 PM

## 2023-07-06 ENCOUNTER — Telehealth: Payer: Self-pay | Admitting: Internal Medicine

## 2023-07-06 MED ORDER — HYDROCODONE-ACETAMINOPHEN 5-325 MG PO TABS: 1.0000 | ORAL_TABLET | Freq: Four times a day (QID) | ORAL | 0 refills | Status: DC | PRN

## 2023-07-06 NOTE — Telephone Encounter (Signed)
Asking for 6 tabs vicodin to manage jaw pain till oral surgery scheduled for 2:00PM Wednesday, 10/16.

## 2023-12-25 ENCOUNTER — Inpatient Hospital Stay: Payer: Medicaid Other | Attending: Family Medicine

## 2023-12-25 ENCOUNTER — Inpatient Hospital Stay: Payer: Medicaid Other | Admitting: Hematology & Oncology

## 2024-01-21 ENCOUNTER — Telehealth: Payer: Self-pay | Admitting: Internal Medicine

## 2024-01-21 MED ORDER — SPIRONOLACTONE 50 MG PO TABS: 50.0000 mg | ORAL_TABLET | Freq: Every day | ORAL | 1 refills | Status: DC

## 2024-01-21 MED ORDER — CLINDAMYCIN PHOS (TWICE-DAILY) 1 % EX GEL: Freq: Two times a day (BID) | CUTANEOUS | 1 refills | Status: AC

## 2024-01-21 MED ORDER — DOXYCYCLINE HYCLATE 100 MG PO TABS: ORAL_TABLET | ORAL | 1 refills | Status: AC

## 2024-01-21 NOTE — Telephone Encounter (Signed)
 Flare hidradenitis suppurativa, waiting to get in with dermatology Requested med sent

## 2024-02-12 ENCOUNTER — Other Ambulatory Visit: Payer: Self-pay | Admitting: Internal Medicine

## 2024-02-12 NOTE — Telephone Encounter (Signed)
Spironolactone refilled.

## 2024-03-02 ENCOUNTER — Other Ambulatory Visit (INDEPENDENT_AMBULATORY_CARE_PROVIDER_SITE_OTHER): Payer: Self-pay

## 2024-03-02 ENCOUNTER — Ambulatory Visit: Admitting: Orthopaedic Surgery

## 2024-03-02 DIAGNOSIS — M25512 Pain in left shoulder: Secondary | ICD-10-CM

## 2024-03-02 DIAGNOSIS — G8929 Other chronic pain: Secondary | ICD-10-CM | POA: Diagnosis not present

## 2024-03-02 MED ORDER — TIZANIDINE HCL 4 MG PO TABS: 4.0000 mg | ORAL_TABLET | Freq: Three times a day (TID) | ORAL | 0 refills | Status: AC | PRN

## 2024-03-02 MED ORDER — GABAPENTIN 300 MG PO CAPS: 300.0000 mg | ORAL_CAPSULE | Freq: Every day | ORAL | 1 refills | Status: AC

## 2024-03-02 NOTE — Progress Notes (Signed)
 The patient is a 43 year old female that we have actually operated on her mom recently.  She comes in with chronic left shoulder pain is gotten worse over the last month.  It is actually bothering her for a while now.  She has also had a little bit of neck pain as well.  She has a little bit of radicular symptoms but not all the way down into the hand.  She denies any injury.  She is a diabetic and on a insulin  pump but her hemoglobin A1c several months ago was over 11 and she said recently it was 8.7.  I think this may have most likely contributed to her shoulder pain worsening as well.  She has helped out her mom a lot with recovering from joint replacement surgery.  She is also a Engineer, site.  Examination of her left shoulder shows significant pain and loss of motion.  I do feel that she is developing a frozen shoulder especially with internal rotation combined with adduction reaching behind her.  I cannot fully forward flex or abduct her shoulder either.  It is clinically well located.  She has good grip and pinch strength.  X-rays of the cervical spine show normal alignment of the cervical spine with just a little bit of loss of cervical lordosis but the disc heights are well-maintained there is no significant arthritic findings on plain film.  3 views of her left shoulder show well located shoulder with no acute findings.  I am going to start gabapentin 300 mg at night for the next 5 days and she can increase this to twice a day if needed to help with dealing with pain.  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  I will also try tizanidine.  She understands that we would like to set her up for outpatient physical therapy for any modalities that can help decrease her left shoulder pain and improve motion.  She understands that we cannot try steroid injection until her blood glucose is under better control.  All  questions and concerns were answered and addressed.  Will see her back in about 6 weeks to see how she is doing overall.

## 2024-03-03 ENCOUNTER — Other Ambulatory Visit: Payer: Self-pay

## 2024-03-03 DIAGNOSIS — G8929 Other chronic pain: Secondary | ICD-10-CM

## 2024-03-14 NOTE — Therapy (Unsigned)
 OUTPATIENT PHYSICAL THERAPY SHOULDER EVALUATION   Patient Name: [name] MRN: [id number] DOB:10/22/1980, 43 y.o., female Today's Date: 03/16/2024  END OF SESSION:  PT End of Session - 03/15/24 1705     Visit Number 1    Number of Visits 12    Date for PT Re-Evaluation 05/15/24    Authorization Type MCD HB    PT Start Time 1700    PT Stop Time 1745    PT Time Calculation (min) 45 min    Activity Tolerance Patient tolerated treatment well    Behavior During Therapy WFL for tasks assessed/performed          Past Medical History:  Diagnosis Date   Asthma    followed by pcp   Bipolar 2 disorder (HCC)    Chronic post-traumatic stress disorder (PTSD)    Factor V Leiden mutation (HCC) 04/2021   per pt had blood done by gyn dr estelle in her office  ;  only one episode DVT LLE 08/ 2022   Fibromyalgia    folllowed by pcp   GAD (generalized anxiety disorder)    GERD (gastroesophageal reflux disease)    no current meds.   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   History of diabetic ketoacidosis    hx multiple admission's for dka (last admission in care everywhere 07-08-2022 @ [facility] w/ mild DKA ,  right buttock abscess, and hyperglycemia   History of DVT of lower extremity    druing pregnancy   last one 04-29-2021  nonoccluded dvt LLE (per was 3 clots in same leg)   History of pregnancy induced hypertension    preeclampsia   Hx MRSA infection    Hx-TIA (transient ischemic attack) 09/2017   no residual's   Insulin  dependent type 1 diabetes mellitus (HCC) 1997   followed by [facility] --- liliana pitu NP;  (uncontrolled)  uses dexcom and has omnipod insulin  pump   (09-18-2022  fasting average blood sugar 80-220)   Insulin  pump in place    omnipod   MDD (major  depressive disorder)    hx   Menorrhagia    Migraine    Panic disorder    Paroxysmal SVT (supraventricular tachycardia) (HCC) 06/2017   (09-18-2022  per pt no issues since 2018)   PCOS (polycystic ovarian syndrome)    Vitamin D  insufficiency    Past Surgical History:  Procedure Laterality Date   CESAREAN SECTION N/A 06/02/2021   Procedure: CESAREAN SECTION;  Surgeon: estelle Service, MD;  Location: [facility];  Service: Obstetrics;  Laterality: N/A;   W/  BILATERAL SALPINGECTOMY   CHALAZION EXCISION Left    age 76  (stye)   CHOLECYSTECTOMY, LAPAROSCOPIC  12/02/2006   @[facility]  by dr christella lunger   COLONOSCOPY  06/16/2017   DILATION AND EVACUATION  02/14/2007   @[facility]  by dr winfred   ESOPHAGOGASTRODUODENOSCOPY  11/17/2011   Procedure: ESOPHAGOGASTRODUODENOSCOPY (EGD);  Surgeon: Jerrell KYM Sol, MD;  Location: [facility];  Service: Endoscopy;  Laterality: N/A;   EXCISION OF BREAST BIOPSY Right    age 67  and reexcision for cancerous skin tag   (benign breast bx)   HYDRADENITIS EXCISION  04/30/2012   Procedure: EXCISION HYDRADENITIS GROIN;  Surgeon:  Vicenta DELENA Poli, MD;  Location: [facility];  Service: General;  Laterality: Left;  wide excision hidradenitis bilateral thighs and Left groin   HYDRADENITIS EXCISION Left 01/13/2014   Procedure: WIDE EXCISION HIDRADENITIS LEFT AXILLA;  Surgeon: Vicenta DELENA Poli, MD;  Location: [facility];  Service: General;  Laterality: Left;   HYSTEROSCOPY WITH D & C N/A 09/19/2022   Procedure: DILATATION AND CURETTAGE LELDON WITH MYOSURE;  Surgeon: Estelle Service, MD;  Location: [facility];  Service: Gynecology;  Laterality: N/A;   INCISION AND DRAINAGE ABSCESS Right 03/17/2019   Procedure: INCISION AND DRAINAGE RIGHT THIGH ABSCESS;  Surgeon: Vanderbilt Ned, MD;  Location: [facility] OR;  Service: General;  Laterality: Right;   INTRAUTERINE DEVICE (IUD) INSERTION N/A 09/19/2022   Procedure: INTRAUTERINE DEVICE (IUD)  INSERTION;  Surgeon: Estelle Service, MD;  Location: [facility];  Service: Gynecology;  Laterality: N/A;  Mirena    IRRIGATION AND DEBRIDEMENT ABSCESS Right 06/02/2014   Procedure: IRRIGATION AND DEBRIDEMENT ABSCESS;  Surgeon: Vicenta Poli, MD;  Location: [facility] OR;  Service: General;  Laterality: Right;   IRRIGATION AND DEBRIDEMENT ABSCESS Left 09/23/2014   Procedure: IRRIGATION AND DEBRIDEMENT ABSCESS/LEFT THIGH;  Surgeon: Dann Hummer, MD;  Location: [facility] OR;  Service: General;  Laterality: Left;   LEFT HEART CATHETERIZATION WITH CORONARY ANGIOGRAM N/A 07/14/2014   Procedure: LEFT HEART CATHETERIZATION WITH CORONARY ANGIOGRAM;  Surgeon: Peter M Swaziland, MD;  Location: [facility] C[facility];  Service: Cardiovascular;  Laterality: N/A;   TAYLOR BUNIONECTOMY Left 2006   TONSILLECTOMY AND ADENOIDECTOMY     age 71   WISDOM TOOTH EXTRACTION     Patient Active Problem List   Diagnosis Date Noted   Precordial chest pain 02/19/2023   S/P primary low transverse C-section 06/02/2021   Preeclampsia, severe 05/31/2021   PIH (pregnancy induced hypertension), third trimester 05/30/2021   Elevated blood pressure affecting pregnancy in third trimester, antepartum 05/24/2021   Difficulty coping 11/16/2020   Chronic posttraumatic stress disorder 04/21/2019   DKA (diabetic ketoacidosis) (HCC) 03/15/2019   Abscess of right thigh 03/15/2019   Bipolar 2 disorder (HCC) 01/04/2019   Panic disorder 01/04/2019   Enteritis 12/24/2017   Intractable nausea and vomiting 12/24/2017   Epigastric pain    Other chest pain 09/28/2017   TIA (transient ischemic attack) 09/28/2017   Paroxysmal SVT (supraventricular tachycardia) (HCC) 06/24/2017   Dizziness 06/24/2017   Weakness 06/24/2017   Hyperglycemia 03/17/2016   Abscess 03/17/2016   Chest discomfort 07/10/2014   Cellulitis 06/01/2014   Cellulitis of right thigh 06/01/2014   Postop check 01/19/2014   Smoking addiction 08/29/2013   DKA, type 1 (HCC)  05/09/2013   Hidradenitis suppurativa 04/21/2012   Esophageal reflux 11/17/2011   Seasonal and perennial allergic rhinitis 05/20/2010   BRONCHITIS, ACUTE 11/29/2007   HYPONATREMIA 11/14/2007   GAD (generalized anxiety disorder) 10/24/2007   Sinus tachycardia 07/07/2007   Diabetes mellitus type I (HCC) 04/08/2007   Smoker 04/08/2007   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  She comes in with chronic left shoulder pain is gotten worse over the last month.  It is actually bothering her for a while now.  She has also had a little bit of neck pain as well.  She has a little bit of radicular symptoms but not all the way down into the hand.  She denies any injury.  She is a diabetic and on a insulin  pump but her hemoglobin A1c several months ago was over 11 and she said recently it was 8.7.  I think this may have most likely contributed to her shoulder pain worsening as well.  She has helped out her mom a lot with recovering from joint replacement surgery.  She is also a Engineer, site.     Hand dominance: Right  PERTINENT HISTORY: I am going to start gabapentin  300 mg at night for the next 5 days and she can increase this to twice a day if needed to help with dealing with pain. 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 I will also try tizanidine . She understands that we would like to set her up for outpatient physical therapy for any modalities that can help decrease her left shoulder pain and improve motion. She understands that we cannot try steroid injection until her blood glucose is under better control. All questions and concerns were answered and addressed. Will see her back in about 6 weeks to see how she is doing overall.  PAIN:  Are you having pain? Yes: NPRS scale: 10/10 at worst Pain location: L shoulder Pain description: ache, sore Aggravating factors: dressing, OH motions, reaching behind Relieving factors: undetermined  PRECAUTIONS: None  RED FLAGS: None   WEIGHT BEARING RESTRICTIONS: No  FALLS:  Has patient fallen in last 6 months? No   OCCUPATION: Medical assistant  PLOF: Independent  PATIENT GOALS:to manage my shoulder pain  NEXT MD VISIT:   OBJECTIVE:  Note: Objective measures were completed at Evaluation unless otherwise noted.  DIAGNOSTIC FINDINGS:  X-rays of the cervical spine show normal alignment of the cervical spine with just a little bit of loss of cervical lordosis but the disc heights are well-maintained there is no significant arthritic findings on plain film. 3 views of her left shoulder show well located shoulder with no acute findings.  PATIENT SURVEYS:  Quick Dash: 45/55   POSTURE: Forward L shoulder  UPPER EXTREMITY ROM:   A/PROM Right eval Left eval  Shoulder flexion  90/90d  Shoulder extension  30/  Shoulder abduction  60/70d  Shoulder adduction    Shoulder internal rotation  L GT/60d  Shoulder external rotation  /35d  Elbow flexion    Elbow extension    Wrist flexion    Wrist extension    Wrist ulnar deviation    Wrist radial deviation    Wrist pronation    Wrist supination    (Blank rows = not tested)  UPPER EXTREMITY MMT: L shoulder MMT test  deferred due to pain  MMT Right eval Left eval  Shoulder flexion    Shoulder extension    Shoulder abduction    Shoulder adduction    Shoulder internal rotation    Shoulder external rotation    Middle trapezius    Lower trapezius    Elbow flexion    Elbow extension  Wrist flexion    Wrist extension    Wrist ulnar deviation    Wrist radial deviation    Wrist pronation    Wrist supination    Grip strength (lbs) 56# 25#  Pinch key 9# 8#  (Blank rows = not tested)  SHOULDER SPECIAL TESTS: Impingement tests: Neer impingement test: positive  and Hawkins/Kennedy impingement test: positive  Rotator cuff assessment: Empty can test: negative   PALPATION:  TTP L AC/CC ligament                                                                                                                             TREATMENT:  [facility] Adult PT Treatment:                                                DATE: 03/15/24 Eval and HEP Self Care: Additional minutes spent for educating on updated Therapeutic Home Exercise Program as well as comparing current status to condition at start of symptoms. This included exercises focusing on stretching, strengthening, with focus on eccentric aspects. Long term goals include an improvement in range of motion, strength, endurance as well as avoiding reinjury. Patient's frequency would include in 1-2 times a day, 3-5 times a week for a duration of 6-12 weeks. Proper technique shown and discussed handout in great detail. All questions were discussed and addressed.      PATIENT EDUCATION: Education details: Discussed eval findings, rehab rationale and POC and patient is in agreement  Person educated: Patient Education method: Explanation Education comprehension: verbalized understanding and needs further education  HOME EXERCISE PROGRAM: Access Code: [facility] URL: [email or web address] Date: 03/15/2024 Prepared by: Reyes Kohut  Exercises - Supine  Shoulder External Rotation with Dowel  - 2-3 x daily - 5 x weekly - 1-2 sets - 15 reps - Supine Shoulder Flexion Extension AAROM with Dowel  - 2-3 x daily - 5 x weekly - 1-2 sets - 15 reps - Supine Shoulder Press AAROM in Abduction with Dowel  - 2-3 x daily - 5 x weekly - 1-2 sets - 15 reps  ASSESSMENT:  CLINICAL IMPRESSION: Patient is a 43 y.o. female who was seen today for physical therapy evaluation and treatment for chronic L shoulder pain/adhesive capsulitis.  Patient presents with marked restrictions in ROM and strength, grip strength diminished, globally tender to palpation with exquisite TTP to LCC/CA ligament.  ROM restrictions suggestive of adhesive capsulitis, impingement an RC tendonitis.  Pain relief options limited by comorbidities and medication contraindications.  OBJECTIVE IMPAIRMENTS: decreased activity tolerance, decreased knowledge of condition, decreased mobility, decreased ROM, decreased strength, impaired perceived functional ability, increased muscle spasms, impaired UE functional use, postural dysfunction, obesity, and pain.   ACTIVITY LIMITATIONS: carrying, lifting, sleeping, dressing, and reach over head   PERSONAL FACTORS: Age, Fitness, Past/current experiences, Time since onset of  injury/illness/exacerbation, and 1 comorbidity: M are also affecting patient's functional outcome.   REHAB POTENTIAL: Good  CLINICAL DECISION MAKING: Evolving/moderate complexity  EVALUATION COMPLEXITY: Moderate   GOALS: Goals reviewed with patient? No  SHORT TERM GOALS: Target date: 04/05/2024    Patient to demonstrate independence in HEP  Baseline: [id number] Goal status: INITIAL  2.  120d AROM flex/abd Baseline: 90/60 respectively Goal status: INITIAL  LONG TERM GOALS: Target date: 04/26/2024    Patient will score at least 30/55 on QDASH to signify clinically meaningful improvement in functional abilities.   Baseline: 45/55 Goal status: INITIAL  2.  Patient will  acknowledge 6/10 pain at least once during episode of care   Baseline: 10/10 Goal status: INITIAL  3.  Increase AROM L shoulder to 160d flex/abd Baseline: 90/60 espectively Goal status: INITIAL  4.  Resolve L shoulder impingement signs Baseline: Positive impingement signs in L shoulder Goal status: INITIAL  5.  Increase L shoulder strength to 4/5 Baseline: TBD Goal status: INITIAL   PLAN:  PT FREQUENCY: 1-2x/week  PT DURATION: 6 weeks  PLANNED INTERVENTIONS: 97110-Therapeutic exercises, 97530- Therapeutic activity, 97112- Neuromuscular re-education, 97535- Self Care, [id number]- Manual therapy, Patient/Family education, and Joint mobilization  PLAN FOR NEXT SESSION: HEP review and update, manual techniques as appropriate, aerobic tasks, ROM and flexibility activities, strengthening and PREs, TPDN, gait and balance training as needed    For all possible CPT codes, reference the Planned Interventions line above.     Check all conditions that are expected to impact treatment: {Conditions expected to impact treatment:Diabetes mellitus   If treatment provided at initial evaluation, no treatment charged due to lack of authorization.       Delbra Zellars M Deya Bigos, PT 03/16/2024, 9:22 AM

## 2024-03-15 ENCOUNTER — Ambulatory Visit: Attending: Orthopaedic Surgery

## 2024-03-15 DIAGNOSIS — M7502 Adhesive capsulitis of left shoulder: Secondary | ICD-10-CM | POA: Insufficient documentation

## 2024-03-15 DIAGNOSIS — M25512 Pain in left shoulder: Secondary | ICD-10-CM | POA: Insufficient documentation

## 2024-03-15 DIAGNOSIS — G8929 Other chronic pain: Secondary | ICD-10-CM | POA: Insufficient documentation

## 2024-03-15 DIAGNOSIS — M6281 Muscle weakness (generalized): Secondary | ICD-10-CM | POA: Diagnosis present

## 2024-04-11 NOTE — Therapy (Signed)
 OUTPATIENT PHYSICAL THERAPY TREATMENT NOTE   Patient Name: [name] MRN: [id number] DOB:01-03-1981, 43 y.o., female Today's Date: 04/12/2024  END OF SESSION:  PT End of Session - 04/12/24 1824     Visit Number 2    Date for PT Re-Evaluation 05/15/24    Authorization Type MCD HB    PT Start Time 1745    PT Stop Time 1825    PT Time Calculation (min) 40 min    Activity Tolerance Patient limited by pain    Behavior During Therapy [facility] for tasks assessed/performed           Past Medical History:  Diagnosis Date   Asthma    followed by pcp   Bipolar 2 disorder (HCC)    Chronic post-traumatic stress disorder (PTSD)    Factor V Leiden mutation (HCC) 04/2021   per pt had blood done by gyn dr estelle in her office  ;  only one episode DVT LLE 08/ 2022   Fibromyalgia    folllowed by pcp   GAD (generalized anxiety disorder)    GERD (gastroesophageal reflux disease)    no current meds.   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   History of diabetic ketoacidosis    hx multiple admission's for dka (last admission in care everywhere 07-08-2022 @ [facility] w/ mild DKA ,  right buttock abscess, and hyperglycemia   History of DVT of lower extremity    druing pregnancy   last one 04-29-2021  nonoccluded dvt LLE (per was 3 clots in same leg)   History of pregnancy induced hypertension    preeclampsia   Hx MRSA infection    Hx-TIA (transient ischemic attack) 09/2017   no residual's   Insulin  dependent type 1 diabetes mellitus (HCC) 1997   followed by [facility] --- liliana pitu NP;  (uncontrolled)  uses dexcom and has omnipod insulin  pump   (09-18-2022  fasting average blood sugar 80-220)   Insulin  pump in place    omnipod   MDD (major depressive disorder)    hx    Menorrhagia    Migraine    Panic disorder    Paroxysmal SVT (supraventricular tachycardia) (HCC) 06/2017   (09-18-2022  per pt no issues since 2018)   PCOS (polycystic ovarian syndrome)    Vitamin D  insufficiency    Past Surgical History:  Procedure Laterality Date   CESAREAN SECTION N/A 06/02/2021   Procedure: CESAREAN SECTION;  Surgeon: estelle Service, MD;  Location: [facility] ORS;  Service: Obstetrics;  Laterality: N/A;   W/  BILATERAL SALPINGECTOMY   CHALAZION EXCISION Left    age 69  (stye)   CHOLECYSTECTOMY, LAPAROSCOPIC  12/02/2006   @[facility]  by dr christella lunger   COLONOSCOPY  06/16/2017   DILATION AND EVACUATION  02/14/2007   @[facility]  by dr winfred   ESOPHAGOGASTRODUODENOSCOPY  11/17/2011   Procedure: ESOPHAGOGASTRODUODENOSCOPY (EGD);  Surgeon: Jerrell KYM Sol, MD;  Location: [facility];  Service: Endoscopy;  Laterality: N/A;   EXCISION OF BREAST BIOPSY Right    age 15  and reexcision for cancerous skin tag   (benign breast bx)   HYDRADENITIS EXCISION  04/30/2012   Procedure: EXCISION HYDRADENITIS GROIN;  Surgeon: Vicenta DELENA Poli, MD;  Location:  [facility];  Service: General;  Laterality: Left;  wide excision hidradenitis bilateral thighs and Left groin   HYDRADENITIS EXCISION Left 01/13/2014   Procedure: WIDE EXCISION HIDRADENITIS LEFT AXILLA;  Surgeon: Vicenta DELENA Poli, MD;  Location: [facility] OR;  Service: General;  Laterality: Left;   HYSTEROSCOPY WITH D & C N/A 09/19/2022   Procedure: DILATATION AND CURETTAGE LELDON WITH MYOSURE;  Surgeon: Estelle Service, MD;  Location: [facility] [place];  Service: Gynecology;  Laterality: N/A;   INCISION AND DRAINAGE ABSCESS Right 03/17/2019   Procedure: INCISION AND DRAINAGE RIGHT THIGH ABSCESS;  Surgeon: Vanderbilt Ned, MD;  Location: [facility] OR;  Service: General;  Laterality: Right;   INTRAUTERINE DEVICE (IUD) INSERTION N/A 09/19/2022   Procedure: INTRAUTERINE DEVICE (IUD) INSERTION;  Surgeon: Estelle Service,  MD;  Location: [facility];  Service: Gynecology;  Laterality: N/A;  Mirena    IRRIGATION AND DEBRIDEMENT ABSCESS Right 06/02/2014   Procedure: IRRIGATION AND DEBRIDEMENT ABSCESS;  Surgeon: Vicenta Poli, MD;  Location: [facility] OR;  Service: General;  Laterality: Right;   IRRIGATION AND DEBRIDEMENT ABSCESS Left 09/23/2014   Procedure: IRRIGATION AND DEBRIDEMENT ABSCESS/LEFT THIGH;  Surgeon: Dann Hummer, MD;  Location: [facility] OR;  Service: General;  Laterality: Left;   LEFT HEART CATHETERIZATION WITH CORONARY ANGIOGRAM N/A 07/14/2014   Procedure: LEFT HEART CATHETERIZATION WITH CORONARY ANGIOGRAM;  Surgeon: Peter M Swaziland, MD;  Location: [facility] CATH LAB;  Service: Cardiovascular;  Laterality: N/A;   TAYLOR BUNIONECTOMY Left 2006   TONSILLECTOMY AND ADENOIDECTOMY     age 17   WISDOM TOOTH EXTRACTION     Patient Active Problem List   Diagnosis Date Noted   Precordial chest pain 02/19/2023   S/P primary low transverse C-section 06/02/2021   Preeclampsia, severe 05/31/2021   PIH (pregnancy induced hypertension), third trimester 05/30/2021   Elevated blood pressure affecting pregnancy in third trimester, antepartum 05/24/2021   Difficulty coping 11/16/2020   Chronic posttraumatic stress disorder 04/21/2019   DKA (diabetic ketoacidosis) (HCC) 03/15/2019   Abscess of right thigh 03/15/2019   Bipolar 2 disorder (HCC) 01/04/2019   Panic disorder 01/04/2019   Enteritis 12/24/2017   Intractable nausea and vomiting 12/24/2017   Epigastric pain    Other chest pain 09/28/2017   TIA (transient ischemic attack) 09/28/2017   Paroxysmal SVT (supraventricular tachycardia) (HCC) 06/24/2017   Dizziness 06/24/2017   Weakness 06/24/2017   Hyperglycemia 03/17/2016   Abscess 03/17/2016   Chest discomfort 07/10/2014   Cellulitis 06/01/2014   Cellulitis of right thigh 06/01/2014   Postop check 01/19/2014   Smoking addiction 08/29/2013   DKA, type 1 (HCC) 05/09/2013   Hidradenitis suppurativa  04/21/2012   Esophageal reflux 11/17/2011   Seasonal and perennial allergic rhinitis 05/20/2010   BRONCHITIS, ACUTE 11/29/2007   HYPONATREMIA 11/14/2007   GAD (generalized anxiety disorder) 10/24/2007   Sinus tachycardia 07/07/2007   Diabetes mellitus type I (HCC) 04/08/2007   Smoker 04/08/2007   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  Has been compliant with HEP.  Has been dropping items more as well as awakening her at night.  Hand dominance: Right  PERTINENT HISTORY: I am going to start gabapentin  300 mg at night for the next 5 days and she can increase this to twice a day if needed to help with dealing with pain. 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 I will also try tizanidine . She understands that we would like to set her up for outpatient physical therapy for any modalities that can help decrease her left shoulder pain and improve motion. She understands that we cannot try steroid injection until her blood glucose is under better control. All questions and concerns were answered and addressed. Will see her back in about 6 weeks to see how she is doing overall.  PAIN:  Are you having pain? Yes: NPRS scale: 10/10 at worst Pain location: L shoulder Pain description: ache,  sore Aggravating factors: dressing, OH motions, reaching behind Relieving factors: undetermined  PRECAUTIONS: None  RED FLAGS: None   WEIGHT BEARING RESTRICTIONS: No  FALLS:  Has patient fallen in last 6 months? No   OCCUPATION: Medical assistant  PLOF: Independent  PATIENT GOALS: to manage my shoulder pain  NEXT MD VISIT:   OBJECTIVE:  Note: Objective measures were completed at Evaluation unless otherwise noted.  DIAGNOSTIC FINDINGS:  X-rays of the cervical spine show normal alignment of the cervical spine with just a little bit of loss of cervical lordosis but the disc heights are well-maintained there is no significant arthritic findings on plain film. 3 views of her left shoulder show well located shoulder with no acute findings.  PATIENT SURVEYS:  Quick Dash: 45/55   POSTURE: Forward L shoulder  UPPER EXTREMITY ROM:   A/PROM Right eval Left eval  Shoulder flexion  90/90d  Shoulder extension  30/  Shoulder abduction  60/70d  Shoulder adduction    Shoulder internal rotation  L GT/60d  Shoulder external rotation  /35d  Elbow flexion    Elbow extension    Wrist flexion    Wrist extension    Wrist ulnar deviation    Wrist radial deviation    Wrist pronation    Wrist supination    (Blank rows = not tested)  UPPER EXTREMITY MMT: L shoulder MMT test deferred due to pain  MMT Right eval Left eval  Shoulder flexion    Shoulder extension    Shoulder abduction    Shoulder adduction    Shoulder internal rotation    Shoulder external rotation    Middle trapezius    Lower trapezius    Elbow flexion    Elbow extension    Wrist flexion    Wrist extension    Wrist ulnar deviation    Wrist radial deviation    Wrist pronation    Wrist supination    Grip strength (lbs) 56# 25#  Pinch key 9# 8#  (Blank rows = not tested)  SHOULDER SPECIAL TESTS: Impingement tests: Neer impingement test: positive  and Hawkins/Kennedy impingement test: positive   Rotator cuff assessment: Empty can test: negative   PALPATION:  TTP L AC/CC ligament  TREATMENT:  [facility] Adult PT Treatment:                                                DATE: 04/12/24 Therapeutic Exercise: UBE L1 6 min Manual Therapy: L pec minor release 2 min Joint mobs inferior/posterior/distraction 5x10 Therapeutic Activity: Supine press UE Ranger 15x Supine ER UE Ranger 15x Supine flexion UE Ranger 15x Supine ER YTB 15x2   [facility] Adult PT Treatment:                                                DATE: 03/15/24 Eval and HEP Self Care: Additional minutes spent for educating on updated Therapeutic Home Exercise Program as well as comparing current status to condition at start of symptoms. This included exercises focusing on stretching, strengthening, with focus on eccentric aspects. Long term goals include an improvement in range of motion, strength, endurance as well as avoiding reinjury. Patient's frequency would include in 1-2 times a day, 3-5 times a week for a duration of 6-12 weeks. Proper technique shown and discussed handout in great detail. All questions were discussed and addressed.      PATIENT EDUCATION: Education details: Discussed eval findings, rehab rationale and POC and patient is in agreement  Person educated: Patient Education method: Explanation Education comprehension: verbalized understanding and needs further education  HOME EXERCISE PROGRAM: Access Code: [facility] URL: [email or web address] Date: 03/15/2024 Prepared by: Reyes Kohut  Exercises - Supine Shoulder External Rotation with Dowel  - 2-3 x daily - 5 x weekly - 1-2 sets - 15 reps - Supine Shoulder Flexion Extension AAROM with Dowel  - 2-3 x daily - 5 x weekly - 1-2 sets - 15 reps - Supine Shoulder Press AAROM in Abduction with Dowel  - 2-3 x daily - 5 x  weekly - 1-2 sets - 15 reps  ASSESSMENT:  CLINICAL IMPRESSION: First f/u session with no changes to report.  L shoulder pain worse and QOL affected as L shoulder awakens her at night.  All PT interventions painful w/o therapeutic benefit.  Unable to tolerate ROM, aerobic work, Metallurgist.  Rehab potential poor until pain can be managed.  Patient to f/u with MD in 2 days.   Patient is a 43 y.o. female who was seen today for physical therapy evaluation and treatment for chronic L shoulder pain/adhesive capsulitis.  Patient presents with marked restrictions in ROM and strength, grip strength diminished, globally tender to palpation with exquisite TTP to LCC/CA ligament.  ROM restrictions suggestive of adhesive capsulitis, impingement an RC tendonitis.  Pain relief options limited by comorbidities and medication contraindications.  OBJECTIVE IMPAIRMENTS: decreased activity tolerance, decreased knowledge of condition, decreased mobility, decreased ROM, decreased strength, impaired perceived functional ability, increased muscle spasms, impaired UE functional use, postural dysfunction, obesity, and pain.   ACTIVITY LIMITATIONS: carrying, lifting, sleeping, dressing, and reach over head   PERSONAL FACTORS: Age, Fitness, Past/current experiences, Time since onset of injury/illness/exacerbation, and 1 comorbidity: M are also affecting patient's functional outcome.   REHAB POTENTIAL: Good  CLINICAL DECISION MAKING: Evolving/moderate complexity  EVALUATION COMPLEXITY: Moderate   GOALS: Goals reviewed with patient? No  SHORT TERM GOALS: Target date: 04/05/2024    Patient to demonstrate independence in HEP  Baseline: [id number] Goal status: INITIAL  2.  120d AROM flex/abd Baseline: 90/60 respectively Goal status: INITIAL  LONG TERM GOALS: Target date: 04/26/2024    Patient will score at least 30/55 on QDASH to signify clinically meaningful improvement in functional abilities.   Baseline:  45/55 Goal status: INITIAL  2.  Patient will acknowledge 6/10 pain at least once during episode of care   Baseline: 10/10 Goal status: INITIAL  3.  Increase AROM L shoulder to 160d flex/abd Baseline: 90/60 espectively Goal status: INITIAL  4.  Resolve L shoulder impingement signs Baseline: Positive impingement signs in L shoulder Goal status: INITIAL  5.  Increase L shoulder strength to 4/5 Baseline: TBD Goal status: INITIAL   PLAN:  PT FREQUENCY: 1-2x/week  PT DURATION: 6 weeks  PLANNED INTERVENTIONS: 97110-Therapeutic exercises, 97530- Therapeutic activity, 97112- Neuromuscular re-education, 97535- Self Care, [id number]- Manual therapy, Patient/Family education, and Joint mobilization  PLAN FOR NEXT SESSION: HEP review and update, manual techniques as appropriate, aerobic tasks, ROM and flexibility activities, strengthening and PREs, TPDN, gait and balance training as needed    For all possible CPT codes, reference the Planned Interventions line above.     Check all conditions that are expected to impact treatment: {Conditions expected to impact treatment:Diabetes mellitus   If treatment provided at initial evaluation, no treatment charged due to lack of authorization.       Armonie Staten M Garfield Coiner, PT 04/12/2024, 6:29 PM

## 2024-04-12 ENCOUNTER — Ambulatory Visit: Attending: Orthopaedic Surgery

## 2024-04-12 DIAGNOSIS — M6281 Muscle weakness (generalized): Secondary | ICD-10-CM | POA: Diagnosis present

## 2024-04-12 DIAGNOSIS — M25512 Pain in left shoulder: Secondary | ICD-10-CM | POA: Insufficient documentation

## 2024-04-12 DIAGNOSIS — G8929 Other chronic pain: Secondary | ICD-10-CM | POA: Insufficient documentation

## 2024-04-12 DIAGNOSIS — M7502 Adhesive capsulitis of left shoulder: Secondary | ICD-10-CM | POA: Insufficient documentation

## 2024-04-14 ENCOUNTER — Ambulatory Visit: Admitting: Orthopaedic Surgery

## 2024-04-14 ENCOUNTER — Encounter: Payer: Self-pay | Admitting: Orthopaedic Surgery

## 2024-04-14 DIAGNOSIS — M25512 Pain in left shoulder: Secondary | ICD-10-CM | POA: Diagnosis not present

## 2024-04-14 DIAGNOSIS — G8929 Other chronic pain: Secondary | ICD-10-CM

## 2024-04-14 MED ORDER — LIDOCAINE HCL 1 % IJ SOLN
3.0000 mL | INTRAMUSCULAR | Status: AC | PRN
Start: 2024-04-14 — End: 2024-04-14
  Administered 2024-04-14: 3 mL

## 2024-04-14 MED ORDER — HYDROCODONE-ACETAMINOPHEN 5-325 MG PO TABS: 1.0000 | ORAL_TABLET | Freq: Four times a day (QID) | ORAL | 0 refills | Status: DC | PRN

## 2024-04-14 MED ORDER — METHYLPREDNISOLONE ACETATE 40 MG/ML IJ SUSP
40.0000 mg | INTRAMUSCULAR | Status: AC | PRN
Start: 2024-04-14 — End: 2024-04-14
  Administered 2024-04-14: 40 mg via INTRA_ARTICULAR

## 2024-04-14 NOTE — Addendum Note (Signed)
 Addended by: Melva Faux on: 04/14/2024 04:16 PM   Modules accepted: Orders

## 2024-04-14 NOTE — Progress Notes (Signed)
 The patient is very well-known to me.  She is 43 years old and returns for follow-up after we saw her recently for her left shoulder.  She developed some arthrofibrosis of the shoulder.  She is diabetic and her blood sugars have been difficult to control.  She cannot take anti-inflammatories due to being on Eliquis .  She has tried Voltaren gel and has worked try to get that shoulder moving.  Her blood glucose is improving so we did recommend a steroid injection in the left shoulder subacromial outlet.  She may end up needing an intra-articular steroid injection.  Given the severity of her pain and how the shoulder got worse after having to help her mother up quite a bit when her mother was recovering from joint replacement surgery, I am concerned about the possibility of a rotator cuff tear.  At this point we need to send her for an MRI of her left shoulder to rule out a rotator cuff tear.  Will see her back in follow-up once we have that MRI.  Of note she did tolerate steroid injection well and she knows to watch her blood glucose very closely over the next week.    Procedure Note  Patient: [name]             Date of Birth: 1981-04-02           MRN: [id number]             Visit Date: 04/14/2024  Procedures: Visit Diagnoses:  1. Chronic left shoulder pain     Large Joint Inj: L subacromial bursa on 04/14/2024 3:53 PM Indications: pain and diagnostic evaluation Details: 22 G 1.5 in needle  Arthrogram: No  Medications: 3 mL lidocaine  1 %; 40 mg methylPREDNISolone  acetate 40 MG/ML Outcome: tolerated well, no immediate complications Procedure, treatment alternatives, risks and benefits explained, specific risks discussed. Consent was given by the patient. Immediately prior to procedure a time out was called to verify the correct patient, procedure, equipment, support staff and site/side marked as required. Patient was prepped and draped in the usual sterile fashion.

## 2024-04-15 ENCOUNTER — Telehealth: Payer: Self-pay | Admitting: Orthopaedic Surgery

## 2024-04-15 ENCOUNTER — Other Ambulatory Visit: Payer: Self-pay | Admitting: Orthopaedic Surgery

## 2024-04-15 ENCOUNTER — Encounter: Payer: Self-pay | Admitting: Orthopaedic Surgery

## 2024-04-15 MED ORDER — DIAZEPAM 5 MG PO TABS: 5.0000 mg | ORAL_TABLET | Freq: Once | ORAL | 0 refills | Status: AC

## 2024-04-15 NOTE — Telephone Encounter (Signed)
 Pt called stating MRI facility just called her with MRI appt which is tomorrow. Pt is asking for Vernetta or Clark to send in a valium to keep her calm. Please call pt when sent pharmacy today. Pt phone number is [phone].

## 2024-04-15 NOTE — Telephone Encounter (Signed)
 Called patient

## 2024-04-16 ENCOUNTER — Ambulatory Visit

## 2024-04-16 ENCOUNTER — Ambulatory Visit
Admission: RE | Admit: 2024-04-16 | Discharge: 2024-04-16 | Disposition: A | Discharge status: Home / Self Care | Source: Ambulatory Visit | Attending: Orthopaedic Surgery | Admitting: Orthopaedic Surgery

## 2024-04-16 DIAGNOSIS — M25512 Pain in left shoulder: Secondary | ICD-10-CM | POA: Diagnosis not present

## 2024-04-16 DIAGNOSIS — M7502 Adhesive capsulitis of left shoulder: Secondary | ICD-10-CM

## 2024-04-16 DIAGNOSIS — M6281 Muscle weakness (generalized): Secondary | ICD-10-CM

## 2024-04-16 DIAGNOSIS — G8929 Other chronic pain: Secondary | ICD-10-CM

## 2024-04-16 NOTE — Therapy (Signed)
 OUTPATIENT PHYSICAL THERAPY TREATMENT NOTE   Patient Name: [name] MRN: [id number] DOB:19-Sep-1981, 43 y.o., female Today's Date: 04/16/2024  END OF SESSION:  PT End of Session - 04/16/24 1119     Visit Number 3    Number of Visits 12    Date for PT Re-Evaluation 05/15/24    Authorization Type MCD HB    PT Start Time 1115    PT Stop Time 1155    PT Time Calculation (min) 40 min    Activity Tolerance Patient limited by pain    Behavior During Therapy [facility] for tasks assessed/performed            Past Medical History:  Diagnosis Date   Asthma    followed by pcp   Bipolar 2 disorder (HCC)    Chronic post-traumatic stress disorder (PTSD)    Factor V Leiden mutation (HCC) 04/2021   per pt had blood done by gyn dr estelle in her office  ;  only one episode DVT LLE 08/ 2022   Fibromyalgia    folllowed by pcp   GAD (generalized anxiety disorder)    GERD (gastroesophageal reflux disease)    no current meds.   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   History of diabetic ketoacidosis    hx multiple admission's for dka (last admission in care everywhere 07-08-2022 @ [facility] w/ mild DKA ,  right buttock abscess, and hyperglycemia   History of DVT of lower extremity    druing pregnancy   last one 04-29-2021  nonoccluded dvt LLE (per was 3 clots in same leg)   History of pregnancy induced hypertension    preeclampsia   Hx MRSA infection    Hx-TIA (transient ischemic attack) 09/2017   no residual's   Insulin  dependent type 1 diabetes mellitus (HCC) 1997   followed by [facility] --- liliana pitu NP;  (uncontrolled)  uses dexcom and has omnipod insulin  pump   (09-18-2022  fasting average blood sugar 80-220)   Insulin  pump in place    omnipod   MDD (major  depressive disorder)    hx   Menorrhagia    Migraine    Panic disorder    Paroxysmal SVT (supraventricular tachycardia) (HCC) 06/2017   (09-18-2022  per pt no issues since 2018)   PCOS (polycystic ovarian syndrome)    Vitamin D  insufficiency    Past Surgical History:  Procedure Laterality Date   CESAREAN SECTION N/A 06/02/2021   Procedure: CESAREAN SECTION;  Surgeon: estelle Service, MD;  Location: [facility] ORS;  Service: Obstetrics;  Laterality: N/A;   W/  BILATERAL SALPINGECTOMY   CHALAZION EXCISION Left    age 84  (stye)   CHOLECYSTECTOMY, LAPAROSCOPIC  12/02/2006   @[facility]  by dr christella lunger   COLONOSCOPY  06/16/2017   DILATION AND EVACUATION  02/14/2007   @[facility]  by dr winfred   ESOPHAGOGASTRODUODENOSCOPY  11/17/2011   Procedure: ESOPHAGOGASTRODUODENOSCOPY (EGD);  Surgeon: Jerrell KYM Sol, MD;  Location: [facility];  Service: Endoscopy;  Laterality: N/A;   EXCISION OF BREAST BIOPSY Right    age 52  and reexcision for cancerous skin tag   (benign breast bx)   HYDRADENITIS EXCISION  04/30/2012   Procedure: EXCISION HYDRADENITIS GROIN;  Surgeon: Vicenta DELENA Poli, MD;  Location: [facility];  Service: General;  Laterality: Left;  wide excision hidradenitis bilateral thighs and Left groin   HYDRADENITIS EXCISION Left 01/13/2014   Procedure: WIDE EXCISION HIDRADENITIS LEFT AXILLA;  Surgeon: Vicenta DELENA Poli, MD;  Location: [facility] OR;  Service: General;  Laterality: Left;   HYSTEROSCOPY WITH D & C N/A 09/19/2022   Procedure: DILATATION AND CURETTAGE LELDON WITH MYOSURE;  Surgeon: Estelle Service, MD;  Location: [facility];  Service: Gynecology;  Laterality: N/A;   INCISION AND DRAINAGE ABSCESS Right 03/17/2019   Procedure: INCISION AND DRAINAGE RIGHT THIGH ABSCESS;  Surgeon: Vanderbilt Ned, MD;  Location: [facility] OR;  Service: General;  Laterality: Right;   INTRAUTERINE DEVICE (IUD) INSERTION N/A 09/19/2022   Procedure: INTRAUTERINE DEVICE (IUD)  INSERTION;  Surgeon: Estelle Service, MD;  Location: [facility];  Service: Gynecology;  Laterality: N/A;  Mirena    IRRIGATION AND DEBRIDEMENT ABSCESS Right 06/02/2014   Procedure: IRRIGATION AND DEBRIDEMENT ABSCESS;  Surgeon: Vicenta Poli, MD;  Location: [facility] OR;  Service: General;  Laterality: Right;   IRRIGATION AND DEBRIDEMENT ABSCESS Left 09/23/2014   Procedure: IRRIGATION AND DEBRIDEMENT ABSCESS/LEFT THIGH;  Surgeon: Dann Hummer, MD;  Location: [facility] OR;  Service: General;  Laterality: Left;   LEFT HEART CATHETERIZATION WITH CORONARY ANGIOGRAM N/A 07/14/2014   Procedure: LEFT HEART CATHETERIZATION WITH CORONARY ANGIOGRAM;  Surgeon: Peter M Swaziland, MD;  Location: [facility] CATH LAB;  Service: Cardiovascular;  Laterality: N/A;   TAYLOR BUNIONECTOMY Left 2006   TONSILLECTOMY AND ADENOIDECTOMY     age 35   WISDOM TOOTH EXTRACTION     Patient Active Problem List   Diagnosis Date Noted   Precordial chest pain 02/19/2023   S/P primary low transverse C-section 06/02/2021   Preeclampsia, severe 05/31/2021   PIH (pregnancy induced hypertension), third trimester 05/30/2021   Elevated blood pressure affecting pregnancy in third trimester, antepartum 05/24/2021   Difficulty coping 11/16/2020   Chronic posttraumatic stress disorder 04/21/2019   DKA (diabetic ketoacidosis) (HCC) 03/15/2019   Abscess of right thigh 03/15/2019   Bipolar 2 disorder (HCC) 01/04/2019   Panic disorder 01/04/2019   Enteritis 12/24/2017   Intractable nausea and vomiting 12/24/2017   Epigastric pain    Other chest pain 09/28/2017   TIA (transient ischemic attack) 09/28/2017   Paroxysmal SVT (supraventricular tachycardia) (HCC) 06/24/2017   Dizziness 06/24/2017   Weakness 06/24/2017   Hyperglycemia 03/17/2016   Abscess 03/17/2016   Chest discomfort 07/10/2014   Cellulitis 06/01/2014   Cellulitis of right thigh 06/01/2014   Postop check 01/19/2014   Smoking addiction 08/29/2013   DKA, type 1 (HCC)  05/09/2013   Hidradenitis suppurativa 04/21/2012   Esophageal reflux 11/17/2011   Seasonal and perennial allergic rhinitis 05/20/2010   BRONCHITIS, ACUTE 11/29/2007   HYPONATREMIA 11/14/2007   GAD (generalized anxiety disorder) 10/24/2007   Sinus tachycardia 07/07/2007   Diabetes mellitus type I (HCC) 04/08/2007   Smoker 04/08/2007   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 Vernetta who administered steroid injection two days ago and was supposed to have an MRI this morning. However, she was unable to d/t not having the medication to help tolerate the procedure. She plans to f/u and reschedule next week if possible. Per Dr. Damian office note on 04/14/2024 I am concerned about the possibility of a rotator cuff tear. At this point we need to send her for an MRI of her left shoulder to rule out a rotator cuff tear. Will see her back in follow-up once we have that MRI. Of note she did tolerate steroid injection well and she knows to watch her blood glucose very closely over the next week.     Hand dominance: Right  PERTINENT HISTORY: I am going to start gabapentin  300 mg at night for the next 5 days and she can increase this to twice a day if needed to help with dealing with pain. 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 I will also try tizanidine . She understands that we would like to set her up for outpatient physical therapy for any modalities that can help decrease her left shoulder pain and improve motion. She understands that we cannot try steroid injection until her blood glucose is under better control. All questions and concerns were answered and addressed. Will see her back in about 6 weeks to see how she is doing overall.  PAIN:  Are you having pain? Yes: NPRS scale: 10/10 at worst Pain location: L shoulder Pain description: ache, sore Aggravating factors: dressing, OH motions, reaching behind Relieving factors: undetermined  PRECAUTIONS: None  RED FLAGS: None   WEIGHT BEARING RESTRICTIONS: No  FALLS:  Has patient fallen in last 6 months? No   OCCUPATION: Medical assistant  PLOF: Independent  PATIENT GOALS: to manage my shoulder pain  NEXT MD VISIT:   OBJECTIVE:  Note: Objective measures were completed at Evaluation unless otherwise noted.  DIAGNOSTIC FINDINGS:  X-rays of the cervical spine show normal alignment of the cervical spine with just a little bit of loss of cervical lordosis but the disc heights are well-maintained there is no significant arthritic findings on plain film. 3 views of her left shoulder show well located shoulder with no acute findings.  PATIENT SURVEYS:  Quick Dash: 45/55   POSTURE: Forward L shoulder  UPPER EXTREMITY ROM:   A/PROM Right eval Left eval  Shoulder flexion  90/90d  Shoulder extension  30/  Shoulder abduction  60/70d  Shoulder adduction    Shoulder internal rotation  L GT/60d  Shoulder external rotation  /35d  Elbow flexion    Elbow extension    Wrist flexion    Wrist extension    Wrist ulnar deviation    Wrist radial deviation    Wrist pronation    Wrist supination    (Blank rows = not tested)  UPPER EXTREMITY MMT: L shoulder MMT test deferred due to pain  MMT Right eval Left eval   Shoulder flexion    Shoulder extension    Shoulder abduction    Shoulder adduction    Shoulder internal rotation    Shoulder external rotation    Middle trapezius    Lower trapezius    Elbow flexion    Elbow extension    Wrist flexion    Wrist extension    Wrist ulnar deviation    Wrist radial  deviation    Wrist pronation    Wrist supination    Grip strength (lbs) 56# 25#  Pinch key 9# 8#  (Blank rows = not tested)  SHOULDER SPECIAL TESTS: Impingement tests: Neer impingement test: positive  and Hawkins/Kennedy impingement test: positive  Rotator cuff assessment: Empty can test: negative   PALPATION:  TTP L AC/CC ligament                                                                                                                             TREATMENT:   [facility] Adult PT Treatment:                                                DATE: 04/16/24  Therapeutic Exercise:   Seated UE ranger flexion 2 x 1 min Seated UE ranger scaption 2 x 1 min; more painful than flexion  Seated UE ranger ER 2 x 30 sec  Isometric shoulder 4-way at doorway, 3 sec hold x 5 each direction Updated and Reviewed HEP d/t current activity tolerance   Therapeutic Activity:  Patient education regarding current strategies for symptom management, decreased intensity of activity d/t recent steroid injection, decreased frequency of PT until MRI completed and reviewed by referring provider or symptoms/impairments begin to improve.     [facility] Adult PT Treatment:                                                DATE: 04/12/24 Therapeutic Exercise: UBE L1 6 min Manual Therapy: L pec minor release 2 min Joint mobs inferior/posterior/distraction 5x10 Therapeutic Activity: Supine press UE Ranger 15x Supine ER UE Ranger 15x Supine flexion UE Ranger 15x Supine ER YTB 15x2   [facility] Adult PT Treatment:                                                DATE: 03/15/24 Eval and HEP Self Care: Additional minutes spent for  educating on updated Therapeutic Home Exercise Program as well as comparing current status to condition at start of symptoms. This included exercises focusing on stretching, strengthening, with focus on eccentric aspects. Long term goals include an improvement in range of motion, strength, endurance as well as avoiding reinjury. Patient's frequency would include in 1-2 times a day, 3-5 times a week for a duration of 6-12 weeks. Proper technique shown and discussed handout in great detail. All questions were discussed and addressed.      PATIENT EDUCATION: Education details: Discussed eval findings, rehab rationale and POC and patient is in agreement  Person educated: Patient Education method: Explanation Education comprehension: verbalized understanding and needs further education  HOME EXERCISE PROGRAM: Access Code: [facility] URL: [email or web address] Date: 04/16/2024 Prepared by: Marko Molt  Exercises - Standing Isometric Shoulder Abduction with Doorway - Arm Bent  - 1 x daily - 7 x weekly - 2 sets - 5-10 reps - 3 sec hold - Standing Isometric Shoulder Flexion with Doorway - Arm Bent  - 1 x daily - 7 x weekly - 2 sets - 5-10 reps - 3 sec hold - Standing Isometric Shoulder Extension with Doorway - Arm Bent  - 1 x daily - 7 x weekly - 2 sets - 5-10 reps - 3 sec hold - Standing Isometric Shoulder Internal Rotation with Towel Roll at Doorway  - 1 x daily - 7 x weekly - 2 sets - 5-10 reps - 3 sec hold - Seated Bilateral Shoulder Flexion Towel Slide at Table Top  - 1 x daily - 7 x weekly - 2 sets - 10 reps - 3 sec hold - Seated Shoulder Scaption Slide at Table Top with Forearm in Neutral  - 1 x daily - 7 x weekly - 2 sets - 10 reps - 3 sec hold - Seated Shoulder External Rotation PROM on Table  - 1 x daily - 7 x weekly - 2 sets - 10 reps - 3 sec hold  ASSESSMENT:  CLINICAL IMPRESSION:   04/16/2024 Rozell had fair tolerance of today's treatment session. Activities modified today  d/t pain severity, response to previous treatment and recent steroid injection to L shoulder. Patient had some difficulty with activities today, but was able to complete today's session with minimal-to-no change in symptoms. Provided patient with updated HEP to focus on seated ROM activities, as this position is better tolerated than supine position, and isometric strengthening. Decreased frequency of visits to 1x/week until she is able to follow up with referring provider regarding MRI results. We will continue to progress per POC as tolerated, in order to reach established rehab goals.    EVAL: Patient is a 43 y.o. female who was seen today for physical therapy evaluation and treatment for chronic L shoulder pain/adhesive capsulitis.  Patient presents with marked restrictions in ROM and strength, grip strength diminished, globally tender to palpation with exquisite TTP to LCC/CA ligament.  ROM restrictions suggestive of adhesive capsulitis, impingement an RC tendonitis.  Pain relief options limited by comorbidities and medication contraindications.  OBJECTIVE IMPAIRMENTS: decreased activity tolerance, decreased knowledge of condition, decreased mobility, decreased ROM, decreased strength, impaired perceived functional ability, increased muscle spasms, impaired UE functional use, postural dysfunction, obesity, and pain.   ACTIVITY LIMITATIONS: carrying, lifting, sleeping, dressing, and reach over head   PERSONAL FACTORS: Age, Fitness, Past/current experiences, Time since onset of injury/illness/exacerbation, and 1 comorbidity: M are also affecting patient's functional outcome.   REHAB POTENTIAL: Good  CLINICAL DECISION MAKING: Evolving/moderate complexity  EVALUATION COMPLEXITY: Moderate   GOALS: Goals reviewed with patient? No  SHORT TERM GOALS: Target date: 04/05/2024    Patient to demonstrate independence in HEP  Baseline: [id number] Goal status: INITIAL  2.  120d AROM  flex/abd Baseline: 90/60 respectively Goal status: INITIAL  LONG TERM GOALS: Target date: 04/26/2024    Patient will score at least 30/55 on QDASH to signify clinically meaningful improvement in functional abilities.   Baseline: 45/55 Goal status: INITIAL  2.  Patient will acknowledge 6/10 pain at least once during episode of care   Baseline: 10/10 Goal status: INITIAL  3.  Increase  AROM L shoulder to 160d flex/abd Baseline: 90/60 espectively Goal status: INITIAL  4.  Resolve L shoulder impingement signs Baseline: Positive impingement signs in L shoulder Goal status: INITIAL  5.  Increase L shoulder strength to 4/5 Baseline: TBD Goal status: INITIAL   PLAN:  PT FREQUENCY: 1-2x/week  PT DURATION: 6 weeks  PLANNED INTERVENTIONS: 97110-Therapeutic exercises, 97530- Therapeutic activity, 97112- Neuromuscular re-education, 97535- Self Care, [id number]- Manual therapy, Patient/Family education, and Joint mobilization  PLAN FOR NEXT SESSION: HEP review and update, manual techniques as appropriate, aerobic tasks, ROM and flexibility activities, strengthening and PREs, TPDN, gait and balance training as needed    For all possible CPT codes, reference the Planned Interventions line above.     Check all conditions that are expected to impact treatment: {Conditions expected to impact treatment:Diabetes mellitus   If treatment provided at initial evaluation, no treatment charged due to lack of authorization.       Marko Molt, PT, DPT  04/16/2024 5:37 PM

## 2024-04-18 ENCOUNTER — Encounter: Payer: Self-pay | Admitting: Orthopaedic Surgery

## 2024-04-18 ENCOUNTER — Telehealth: Payer: Self-pay | Admitting: Orthopaedic Surgery

## 2024-04-18 NOTE — Telephone Encounter (Signed)
 I called the patient ---- changed facility to [facility]  for an open MRI. The patient says she does have a Valium  at the pharmacy waiting for pickup. I advised her that her prior authorization is being changed to reflect the new location, and the order is being sent to [facility]. They will contact her to schedule.The patient had no further questions and agreed to this plan.

## 2024-04-18 NOTE — Telephone Encounter (Signed)
 Pt states she was unable to get thru her MRI due to claustrophobia and need an open MRI or sedated MRI. Please call pt about this matter at [phone].

## 2024-04-20 ENCOUNTER — Ambulatory Visit: Admitting: Physical Therapy

## 2024-04-20 DIAGNOSIS — G8929 Other chronic pain: Secondary | ICD-10-CM

## 2024-04-20 DIAGNOSIS — M6281 Muscle weakness (generalized): Secondary | ICD-10-CM

## 2024-04-20 DIAGNOSIS — M7502 Adhesive capsulitis of left shoulder: Secondary | ICD-10-CM

## 2024-04-20 NOTE — Therapy (Signed)
 OUTPATIENT PHYSICAL THERAPY TREATMENT NOTE   Patient Name: [name] MRN: [id number] DOB:Jan 22, 1981, 43 y.o., female Today's Date: 04/20/2024  END OF SESSION:  PT End of Session - 04/20/24 1744     Visit Number 4    Number of Visits 12    Date for PT Re-Evaluation 05/15/24    Authorization Type MCD HB    PT Start Time 1745    Activity Tolerance Patient limited by pain    Behavior During Therapy [facility] for tasks assessed/performed            Past Medical History:  Diagnosis Date   Asthma    followed by pcp   Bipolar 2 disorder (HCC)    Chronic post-traumatic stress disorder (PTSD)    Factor V Leiden mutation (HCC) 04/2021   per pt had blood done by gyn dr estelle in her office  ;  only one episode DVT LLE 08/ 2022   Fibromyalgia    folllowed by pcp   GAD (generalized anxiety disorder)    GERD (gastroesophageal reflux disease)    no current meds.   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   History of diabetic ketoacidosis    hx multiple admission's for dka (last admission in care everywhere 07-08-2022 @ [facility] w/ mild DKA ,  right buttock abscess, and hyperglycemia   History of DVT of lower extremity    druing pregnancy   last one 04-29-2021  nonoccluded dvt LLE (per was 3 clots in same leg)   History of pregnancy induced hypertension    preeclampsia   Hx MRSA infection    Hx-TIA (transient ischemic attack) 09/2017   no residual's   Insulin  dependent type 1 diabetes mellitus (HCC) 1997   followed by [facility] --- liliana pitu NP;  (uncontrolled)  uses dexcom and has omnipod insulin  pump   (09-18-2022  fasting average blood sugar 80-220)   Insulin  pump in place    omnipod   MDD (major depressive disorder)    hx   Menorrhagia    Migraine    Panic  disorder    Paroxysmal SVT (supraventricular tachycardia) (HCC) 06/2017   (09-18-2022  per pt no issues since 2018)   PCOS (polycystic ovarian syndrome)    Vitamin D  insufficiency    Past Surgical History:  Procedure Laterality Date   CESAREAN SECTION N/A 06/02/2021   Procedure: CESAREAN SECTION;  Surgeon: estelle Service, MD;  Location: [facility];  Service: Obstetrics;  Laterality: N/A;   W/  BILATERAL SALPINGECTOMY   CHALAZION EXCISION Left    age 89  (stye)   CHOLECYSTECTOMY, LAPAROSCOPIC  12/02/2006   @[facility]  by dr christella lunger   COLONOSCOPY  06/16/2017   DILATION AND EVACUATION  02/14/2007   @[facility]  by dr winfred   ESOPHAGOGASTRODUODENOSCOPY  11/17/2011   Procedure: ESOPHAGOGASTRODUODENOSCOPY (EGD);  Surgeon: Jerrell KYM Sol, MD;  Location: [facility];  Service: Endoscopy;  Laterality: N/A;   EXCISION OF BREAST BIOPSY Right    age 19  and reexcision for cancerous skin tag   (benign breast bx)   HYDRADENITIS EXCISION  04/30/2012   Procedure: EXCISION HYDRADENITIS GROIN;  Surgeon: Vicenta DELENA Poli, MD;  Location: [facility];  Service: General;  Laterality: Left;  wide excision hidradenitis bilateral thighs and Left groin   HYDRADENITIS EXCISION Left 01/13/2014   Procedure: WIDE EXCISION HIDRADENITIS LEFT AXILLA;  Surgeon: Vicenta DELENA Poli, MD;  Location: [facility];  Service: General;  Laterality: Left;   HYSTEROSCOPY WITH D & C N/A 09/19/2022   Procedure: DILATATION AND CURETTAGE LELDON WITH MYOSURE;  Surgeon: Estelle Service, MD;  Location: [facility];  Service: Gynecology;  Laterality: N/A;   INCISION AND DRAINAGE ABSCESS Right 03/17/2019   Procedure: INCISION AND DRAINAGE RIGHT THIGH ABSCESS;  Surgeon: Vanderbilt Ned, MD;  Location: [facility];  Service: General;  Laterality: Right;   INTRAUTERINE DEVICE (IUD) INSERTION N/A 09/19/2022   Procedure: INTRAUTERINE DEVICE (IUD) INSERTION;  Surgeon: Estelle Service, MD;  Location: [facility];  Service: Gynecology;  Laterality: N/A;  Mirena    IRRIGATION AND DEBRIDEMENT ABSCESS Right 06/02/2014   Procedure: IRRIGATION AND DEBRIDEMENT ABSCESS;  Surgeon: Vicenta Poli, MD;  Location: [facility];  Service: General;  Laterality: Right;   IRRIGATION AND DEBRIDEMENT ABSCESS Left 09/23/2014   Procedure: IRRIGATION AND DEBRIDEMENT ABSCESS/LEFT THIGH;  Surgeon: Dann Hummer, MD;  Location: [facility];  Service: General;  Laterality: Left;   LEFT HEART CATHETERIZATION WITH CORONARY ANGIOGRAM N/A 07/14/2014   Procedure: LEFT HEART CATHETERIZATION WITH CORONARY ANGIOGRAM;  Surgeon: Peter M Swaziland, MD;  Location: [facility];  Service: Cardiovascular;  Laterality: N/A;   TAYLOR BUNIONECTOMY Left 2006   TONSILLECTOMY AND ADENOIDECTOMY     age 63   WISDOM TOOTH EXTRACTION     Patient Active Problem List   Diagnosis Date Noted   Precordial chest pain 02/19/2023   S/P primary low transverse C-section 06/02/2021   Preeclampsia, severe 05/31/2021   PIH (pregnancy induced hypertension), third trimester 05/30/2021   Elevated blood pressure affecting pregnancy in third trimester, antepartum 05/24/2021   Difficulty coping 11/16/2020   Chronic posttraumatic stress disorder 04/21/2019   DKA (diabetic ketoacidosis) (HCC) 03/15/2019   Abscess of right thigh 03/15/2019   Bipolar 2 disorder (HCC) 01/04/2019   Panic disorder 01/04/2019   Enteritis 12/24/2017   Intractable nausea and vomiting 12/24/2017   Epigastric pain    Other chest pain 09/28/2017   TIA (transient ischemic attack) 09/28/2017   Paroxysmal SVT (supraventricular tachycardia) (HCC) 06/24/2017   Dizziness 06/24/2017   Weakness 06/24/2017   Hyperglycemia 03/17/2016   Abscess 03/17/2016   Chest discomfort 07/10/2014   Cellulitis 06/01/2014   Cellulitis of right thigh 06/01/2014   Postop check 01/19/2014   Smoking addiction 08/29/2013   DKA, type 1 (HCC) 05/09/2013   Hidradenitis suppurativa 04/21/2012   Esophageal reflux  11/17/2011   Seasonal and perennial allergic rhinitis 05/20/2010   BRONCHITIS, ACUTE 11/29/2007   HYPONATREMIA 11/14/2007   GAD (generalized anxiety disorder) 10/24/2007   Sinus tachycardia 07/07/2007   Diabetes mellitus type I (HCC) 04/08/2007   Smoker 04/08/2007   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 Pt stated that they have maintained good compliance with current HEP.      04/20/2024 Patient did f/u Dr. Vernetta who administered steroid injection two days ago and was supposed to have an MRI this morning. However, she was unable to d/t not having the medication to help tolerate the procedure. She plans to f/u and reschedule next week if possible. Per Dr. Damian office note on 04/14/2024 I am concerned about the possibility of a rotator cuff tear. At this point we need to send her for an MRI of her left shoulder to rule out a rotator cuff tear. Will see her back in follow-up once we have that MRI. Of note she did tolerate steroid injection well and she knows to watch her blood glucose very closely over the next week.     Hand dominance: Right  PERTINENT HISTORY: I am going to start gabapentin  300 mg at night for the next 5 days and she can increase this to twice a day if needed to help with dealing with pain. 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 I will also try tizanidine . She understands that we would like to set her up for outpatient physical therapy for any modalities that can help decrease her left shoulder pain and improve motion. She understands that we cannot try steroid injection until her blood glucose is under better control. All questions and concerns were answered and addressed. Will see her back in about 6 weeks to see how she is doing overall.  PAIN:  Are you having pain? Yes: NPRS scale: 10/10 at worst Pain location: L shoulder Pain description: ache, sore Aggravating factors: dressing, OH motions, reaching behind Relieving factors: undetermined  PRECAUTIONS: None  RED FLAGS: None   WEIGHT BEARING RESTRICTIONS: No  FALLS:  Has patient fallen in last 6 months? No   OCCUPATION: Medical assistant  PLOF: Independent  PATIENT GOALS: to manage my shoulder pain  NEXT MD VISIT:   OBJECTIVE:  Note: Objective measures were completed at Evaluation unless otherwise noted.  DIAGNOSTIC FINDINGS:  X-rays of the cervical spine show normal alignment of the cervical spine with just a little bit of loss of cervical lordosis but the disc heights are well-maintained there is no significant arthritic findings on plain film. 3 views of her left shoulder show well located shoulder with no acute findings.  PATIENT SURVEYS:  Quick Dash: 45/55   POSTURE: Forward L shoulder  UPPER EXTREMITY ROM:   A/PROM Right eval Left eval  Shoulder flexion  90/90d  Shoulder extension  30/  Shoulder abduction  60/70d  Shoulder adduction    Shoulder internal rotation  L GT/60d  Shoulder external rotation  /35d  Elbow flexion    Elbow extension    Wrist flexion    Wrist extension    Wrist ulnar deviation    Wrist radial deviation    Wrist pronation    Wrist supination    (Blank rows = not tested)  UPPER EXTREMITY MMT: L  shoulder MMT test deferred due to pain  MMT Right eval Left eval  Shoulder flexion    Shoulder extension    Shoulder abduction    Shoulder adduction    Shoulder internal rotation    Shoulder external rotation    Middle trapezius    Lower trapezius    Elbow flexion  Elbow extension    Wrist flexion    Wrist extension    Wrist ulnar deviation    Wrist radial deviation    Wrist pronation    Wrist supination    Grip strength (lbs) 56# 25#  Pinch key 9# 8#  (Blank rows = not tested)  SHOULDER SPECIAL TESTS: Impingement tests: Neer impingement test: positive  and Hawkins/Kennedy impingement test: positive  Rotator cuff assessment: Empty can test: negative   PALPATION:  TTP L AC/CC ligament                                                                                                                             TREATMENT:   [facility] Adult PT Treatment:                                                DATE: 04/16/24  Therapeutic Exercise:   Seated UE ranger flexion 2 x 1 min Seated UE ranger scaption 2 x 1 min; more painful than flexion  Seated UE ranger ER 2 x 30 sec  Isometric shoulder 4-way at doorway, 3 sec hold x 5 each direction Updated and Reviewed HEP d/t current activity tolerance   Therapeutic Activity:  Patient education regarding current strategies for symptom management, decreased intensity of activity d/t recent steroid injection, decreased frequency of PT until MRI completed and reviewed by referring provider or symptoms/impairments begin to improve.     [facility] Adult PT Treatment:                                                DATE: 04/12/24 Therapeutic Exercise: UBE L1 6 min Manual Therapy: L pec minor release 2 min Joint mobs inferior/posterior/distraction 5x10 Therapeutic Activity: Supine press UE Ranger 15x Supine ER UE Ranger 15x Supine flexion UE Ranger 15x Supine ER YTB 15x2   [facility] Adult PT Treatment:                                                 DATE: 03/15/24 Eval and HEP Self Care: Additional minutes spent for educating on updated Therapeutic Home Exercise Program as well as comparing current status to condition at start of symptoms. This included exercises focusing on stretching, strengthening, with focus on eccentric aspects. Long term goals include an improvement in range of motion, strength, endurance as well as avoiding reinjury. Patient's frequency would include in 1-2 times a day, 3-5 times a week for a duration of 6-12 weeks. Proper technique shown and discussed handout in great detail. All questions were discussed and  addressed.      PATIENT EDUCATION: Education details: Discussed eval findings, rehab rationale and POC and patient is in agreement  Person educated: Patient Education method: Explanation Education comprehension: verbalized understanding and needs further education  HOME EXERCISE PROGRAM: Access Code: [facility] URL: [email or web address] Date: 04/16/2024 Prepared by: Marko Molt  Exercises - Standing Isometric Shoulder Abduction with Doorway - Arm Bent  - 1 x daily - 7 x weekly - 2 sets - 5-10 reps - 3 sec hold - Standing Isometric Shoulder Flexion with Doorway - Arm Bent  - 1 x daily - 7 x weekly - 2 sets - 5-10 reps - 3 sec hold - Standing Isometric Shoulder Extension with Doorway - Arm Bent  - 1 x daily - 7 x weekly - 2 sets - 5-10 reps - 3 sec hold - Standing Isometric Shoulder Internal Rotation with Towel Roll at Doorway  - 1 x daily - 7 x weekly - 2 sets - 5-10 reps - 3 sec hold - Seated Bilateral Shoulder Flexion Towel Slide at Table Top  - 1 x daily - 7 x weekly - 2 sets - 10 reps - 3 sec hold - Seated Shoulder Scaption Slide at Table Top with Forearm in Neutral  - 1 x daily - 7 x weekly - 2 sets - 10 reps - 3 sec hold - Seated Shoulder External Rotation PROM on Table  - 1 x daily - 7 x weekly - 2 sets - 10 reps - 3 sec hold  ASSESSMENT:  CLINICAL IMPRESSION:  Pt arrived stating  they are in extremely high levels of pain and have had a bad day in general. Wants to pause with therapy until results from MRI come back on August 7th, 2025.   EVAL: Patient is a 43 y.o. female who was seen today for physical therapy evaluation and treatment for chronic L shoulder pain/adhesive capsulitis.  Patient presents with marked restrictions in ROM and strength, grip strength diminished, globally tender to palpation with exquisite TTP to LCC/CA ligament.  ROM restrictions suggestive of adhesive capsulitis, impingement an RC tendonitis.  Pain relief options limited by comorbidities and medication contraindications.  OBJECTIVE IMPAIRMENTS: decreased activity tolerance, decreased knowledge of condition, decreased mobility, decreased ROM, decreased strength, impaired perceived functional ability, increased muscle spasms, impaired UE functional use, postural dysfunction, obesity, and pain.   ACTIVITY LIMITATIONS: carrying, lifting, sleeping, dressing, and reach over head   PERSONAL FACTORS: Age, Fitness, Past/current experiences, Time since onset of injury/illness/exacerbation, and 1 comorbidity: M are also affecting patient's functional outcome.   REHAB POTENTIAL: Good  CLINICAL DECISION MAKING: Evolving/moderate complexity  EVALUATION COMPLEXITY: Moderate   GOALS: Goals reviewed with patient? No  SHORT TERM GOALS: Target date: 04/05/2024    Patient to demonstrate independence in HEP  Baseline: [id number] Goal status: INITIAL  2.  120d AROM flex/abd Baseline: 90/60 respectively Goal status: INITIAL  LONG TERM GOALS: Target date: 04/26/2024    Patient will score at least 30/55 on QDASH to signify clinically meaningful improvement in functional abilities.   Baseline: 45/55 Goal status: INITIAL  2.  Patient will acknowledge 6/10 pain at least once during episode of care   Baseline: 10/10 Goal status: INITIAL  3.  Increase AROM L shoulder to 160d flex/abd Baseline: 90/60  espectively Goal status: INITIAL  4.  Resolve L shoulder impingement signs Baseline: Positive impingement signs in L shoulder Goal status: INITIAL  5.  Increase L shoulder strength to 4/5 Baseline: TBD Goal status: INITIAL   PLAN:  PT FREQUENCY: 1-2x/week  PT DURATION: 6 weeks  PLANNED INTERVENTIONS: 97110-Therapeutic exercises, 97530- Therapeutic activity, 97112- Neuromuscular re-education, 97535- Self Care, [id number]- Manual therapy, Patient/Family education, and Joint mobilization  PLAN FOR NEXT SESSION: HEP review and update, manual techniques as appropriate, aerobic tasks, ROM and flexibility activities, strengthening and PREs, TPDN, gait and balance training as needed    For all possible CPT codes, reference the Planned Interventions line above.     Check all conditions that are expected to impact treatment: {Conditions expected to impact treatment:Diabetes mellitus   If treatment provided at initial evaluation, no treatment charged due to lack of authorization.       Marko Molt, PT, DPT  04/20/2024 5:45 PM

## 2024-04-23 ENCOUNTER — Ambulatory Visit: Admitting: Physical Therapy

## 2024-04-30 ENCOUNTER — Other Ambulatory Visit

## 2024-05-03 ENCOUNTER — Ambulatory Visit: Payer: Self-pay | Admitting: Physical Therapy

## 2024-05-17 ENCOUNTER — Encounter: Payer: Self-pay | Admitting: Orthopaedic Surgery

## 2024-05-18 NOTE — Telephone Encounter (Signed)
Scheduled 9/5

## 2024-05-27 ENCOUNTER — Ambulatory Visit (HOSPITAL_BASED_OUTPATIENT_CLINIC_OR_DEPARTMENT_OTHER): Admitting: Orthopaedic Surgery

## 2024-05-27 ENCOUNTER — Other Ambulatory Visit (HOSPITAL_BASED_OUTPATIENT_CLINIC_OR_DEPARTMENT_OTHER): Payer: Self-pay

## 2024-05-27 DIAGNOSIS — G8929 Other chronic pain: Secondary | ICD-10-CM | POA: Diagnosis not present

## 2024-05-27 DIAGNOSIS — M25512 Pain in left shoulder: Secondary | ICD-10-CM | POA: Diagnosis not present

## 2024-05-27 NOTE — Progress Notes (Signed)
 Chief Complaint: Left shoulder pain     History of Present Illness:    [name] is a 43 y.o. female presents today with ongoing left shoulder pain.  She does have a history of significant type 1 diabetes.  She has noticed pain and decreased range of motion for the last 6 months which has been worsening.  She has seen Dr. Vernetta he did provide an posterior glenohumeral injection.  She is here today for further discussion as well as MRI discussion.    PMH/PSH/Family History/Social History/Meds/Allergies:    Past Medical History:  Diagnosis Date   Asthma    followed by pcp   Bipolar 2 disorder (HCC)    Chronic post-traumatic stress disorder (PTSD)    Factor V Leiden mutation (HCC) 04/2021   per pt had blood done by gyn dr estelle in her office  ;  only one episode DVT LLE 08/ 2022   Fibromyalgia    folllowed by pcp   GAD (generalized anxiety disorder)    GERD (gastroesophageal reflux disease)    no current meds.   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   History of diabetic ketoacidosis    hx multiple admission's for dka (last admission in care everywhere 07-08-2022 @ [facility] w/ mild DKA ,  right buttock abscess, and hyperglycemia   History of DVT of lower extremity    druing pregnancy   last one 04-29-2021  nonoccluded dvt LLE (per was 3 clots in same leg)   History of pregnancy induced hypertension    preeclampsia   Hx MRSA infection    Hx-TIA (transient ischemic attack) 09/2017   no residual's   Insulin  dependent type 1 diabetes mellitus (HCC) 1997   followed by [facility] --- liliana pitu NP;  (uncontrolled)  uses dexcom and has omnipod insulin  pump   (09-18-2022  fasting average blood sugar 80-220)   Insulin  pump in place    omnipod   MDD (major  depressive disorder)    hx   Menorrhagia    Migraine    Panic disorder    Paroxysmal SVT (supraventricular tachycardia) (HCC) 06/2017   (09-18-2022  per pt no issues since 2018)   PCOS (polycystic ovarian syndrome)    Vitamin D  insufficiency    Past Surgical History:  Procedure Laterality Date   CESAREAN SECTION N/A 06/02/2021   Procedure: CESAREAN SECTION;  Surgeon: estelle Service, MD;  Location: [facility];  Service: Obstetrics;  Laterality: N/A;   W/  BILATERAL SALPINGECTOMY   CHALAZION EXCISION Left    age 49  (stye)   CHOLECYSTECTOMY, LAPAROSCOPIC  12/02/2006   @[facility]  by dr christella lunger   COLONOSCOPY  06/16/2017   DILATION AND EVACUATION  02/14/2007   @[facility]  by dr winfred   ESOPHAGOGASTRODUODENOSCOPY  11/17/2011   Procedure: ESOPHAGOGASTRODUODENOSCOPY (EGD);  Surgeon: Jerrell KYM Sol, MD;  Location: [facility];  Service: Endoscopy;  Laterality: N/A;   EXCISION OF BREAST BIOPSY Right    age 69  and reexcision for cancerous skin tag   (benign breast bx)   HYDRADENITIS EXCISION  04/30/2012   Procedure: EXCISION HYDRADENITIS GROIN;  Surgeon: Vicenta DELENA Vernetta, MD;  Location: [facility];  Service: General;  Laterality:  Left;  wide excision hidradenitis bilateral thighs and Left groin   HYDRADENITIS EXCISION Left 01/13/2014   Procedure: WIDE EXCISION HIDRADENITIS LEFT AXILLA;  Surgeon: Vicenta DELENA Poli, MD;  Location: [facility];  Service: General;  Laterality: Left;   HYSTEROSCOPY WITH D & C N/A 09/19/2022   Procedure: DILATATION AND CURETTAGE LELDON WITH MYOSURE;  Surgeon: Estelle Service, MD;  Location: [facility];  Service: Gynecology;  Laterality: N/A;   INCISION AND DRAINAGE ABSCESS Right 03/17/2019   Procedure: INCISION AND DRAINAGE RIGHT THIGH ABSCESS;  Surgeon: Vanderbilt Ned, MD;  Location: [facility] OR;  Service: General;  Laterality: Right;   INTRAUTERINE DEVICE (IUD) INSERTION N/A 09/19/2022   Procedure: INTRAUTERINE DEVICE (IUD)  INSERTION;  Surgeon: Estelle Service, MD;  Location: [facility];  Service: Gynecology;  Laterality: N/A;  Mirena    IRRIGATION AND DEBRIDEMENT ABSCESS Right 06/02/2014   Procedure: IRRIGATION AND DEBRIDEMENT ABSCESS;  Surgeon: Vicenta Poli, MD;  Location: [facility] OR;  Service: General;  Laterality: Right;   IRRIGATION AND DEBRIDEMENT ABSCESS Left 09/23/2014   Procedure: IRRIGATION AND DEBRIDEMENT ABSCESS/LEFT THIGH;  Surgeon: Dann Hummer, MD;  Location: [facility] OR;  Service: General;  Laterality: Left;   LEFT HEART CATHETERIZATION WITH CORONARY ANGIOGRAM N/A 07/14/2014   Procedure: LEFT HEART CATHETERIZATION WITH CORONARY ANGIOGRAM;  Surgeon: Peter M Swaziland, MD;  Location: [facility] CATH LAB;  Service: Cardiovascular;  Laterality: N/A;   TAYLOR BUNIONECTOMY Left 2006   TONSILLECTOMY AND ADENOIDECTOMY     age 52   WISDOM TOOTH EXTRACTION     Social History   Socioeconomic History   Marital status: Married    Spouse name: Cedrica Brune   Number of children: Not on file   Years of education: Not on file   Highest education level: Not on file  Occupational History   Not on file  Tobacco Use   Smoking status: Every Day    Current packs/day: 0.50    Average packs/day: 0.5 packs/day for 20.0 years (10.0 ttl pk-yrs)    Types: Cigarettes   Smokeless tobacco: Never   Tobacco comments:    09-18-2022  per pt had stopped smoking 09-22-2020 but has restart approx 08/ 2023 1/2 ppd,  total average smoking 20 yrs  Vaping Use   Vaping status: Never Used  Substance and Sexual Activity   Alcohol use: No    Alcohol/week: 0.0 standard drinks of alcohol   Drug use: Not Currently    Types: Marijuana    Comment: 2020   Sexual activity: Yes    Partners: Male    Birth control/protection: Surgical    Comment: tubal w/ c/s 06-02-2021  Other Topics Concern   Not on file  Social History Narrative   Not on file   Social Drivers of Health   Financial Resource Strain: Medium Risk (09/03/2023)    Received from [facility]   Overall Financial Resource Strain (CARDIA)    Difficulty of Paying Living Expenses: Somewhat hard  Food Insecurity: Food Insecurity Present (09/03/2023)   Received from [facility]   Hunger Vital Sign    Within the past 12 months, you worried that your food would run out before you got the money to buy more.: Sometimes true    Within the past 12 months, the food you bought just didn't last and you didn't have money to get more.: Sometimes true  Transportation Needs: No Transportation Needs (09/03/2023)   Received from [facility] - Transportation    Lack of Transportation (Medical):  No    Lack of Transportation (Non-Medical): No  Physical Activity: Insufficiently Active (09/03/2023)   Received from [facility]   Exercise Vital Sign    On average, how many days per week do you engage in moderate to strenuous exercise (like a brisk walk)?: 3 days    On average, how many minutes do you engage in exercise at this level?: 30 min  Stress: Stress Concern Present (09/03/2023)   Received from [facility] - Occupational Stress Questionnaire    Feeling of Stress : Very much  Social Connections: Somewhat Isolated (09/03/2023)   Received from [facility]   Social Network    How would you rate your social network (family, work, friends)?: Restricted participation with some degree of social isolation   Family History  Problem Relation Age of Onset   Cancer Mother    Anxiety disorder Mother    Depression Mother    Sexual abuse Mother    Breast cancer Mother 32   Hyperlipidemia Sister    Hypertension Sister    Sexual abuse Sister    Hypertension Father    Anxiety disorder Father    Depression Father    Drug abuse Father    Stroke Paternal Uncle    ADD / ADHD Paternal Uncle    Anxiety disorder Paternal Uncle    Anxiety disorder Maternal Aunt    Seizures Maternal Aunt    Anxiety disorder  Paternal Aunt    Anxiety disorder Maternal Uncle    ADD / ADHD Cousin    ADD / ADHD Daughter    Allergies  Allergen Reactions   Avandia [Rosiglitazone] Swelling    Swelling of face and legs   Avelox [Moxifloxacin] Shortness Of Breath and Rash   Latex Hives, Shortness Of Breath and Rash   Levaquin [Levofloxacin] Shortness Of Breath and Rash   Omalizumab  Anaphylaxis and Rash    (Xolair )    Oxycodone  Shortness Of Breath, Swelling and Rash    NORCO/ VICODIN OK   Peach [Prunus Persica] Hives and Shortness Of Breath   Potassium-Containing Compounds Other (See Comments)    IV ROUTE - CAUSES VEINS TO COLLAPS; Reports that it is undiluted K only   Prednisone  Other (See Comments)    SEVERE ELEVATION OF BLOOD SUGAR. Able to tolerate 40 mg   Propoxyphene N-Acetaminophen  Swelling    SWELLING OF FACE AND THROAT   Tomato Anaphylaxis    Raw tomato's //  cooked tomato's ok   Adhesive [Tape] Hives, Itching and Rash   Morphine  And Codeine  Other (See Comments)    Causes hallucinations   Prozac [Fluoxetine Hcl] Other (See Comments)    Made her very aggressive    Chantix  [Varenicline ]     dreams   Citrullus Vulgaris Nausea And Vomiting    Facial swelling   Humira [Adalimumab]     Does not remember    Zyvox [Linezolid] Swelling   Cefaclor Rash   Flagyl [Metronidazole] Rash   Keflex [Cephalexin] Diarrhea and Rash    REACTION: severe migraine   Promethazine  Hcl Other (See Comments)    IV ROUTE ONLY - JITTERY FEELING. Patient reports that it is mild and she has used promethazine  since then  PO tablet ok   Septra [Sulfamethoxazole-Trimethoprim] Rash   Sulfadiazine Rash   Current Outpatient Medications  Medication Sig Dispense Refill   acetaminophen  (TYLENOL ) 325 MG tablet Take 650 mg by mouth every 4 (four) hours as needed.     albuterol  (  VENTOLIN  HFA) 108 (90 Base) MCG/ACT inhaler Inhale into the lungs every 6 (six) hours as needed for wheezing or shortness of breath. (Patient not taking:  Reported on 05/14/2023)     apixaban  (ELIQUIS ) 5 MG TABS tablet Take 1 tablet (5 mg total) by mouth 2 (two) times daily. 60 tablet 6   budesonide-formoterol (SYMBICORT) 160-4.5 MCG/ACT inhaler Inhale 2 puffs into the lungs as needed. (Patient not taking: Reported on 05/14/2023)     clindamycin  (CLINDAGEL) 1 % gel Apply topically 2 (two) times daily. 30 g 1   clonazePAM  (KLONOPIN ) 0.5 MG tablet Take 0.5 mg by mouth 2 (two) times daily as needed for anxiety. (Patient not taking: Reported on 05/14/2023)     doxycycline  (VIBRA -TABS) 100 MG tablet 1 daily as directed 30 tablet 1   gabapentin  (NEURONTIN ) 300 MG capsule Take 1 capsule (300 mg total) by mouth at bedtime. Take once at night for 5 days.  If tolerating well, can increase to twice daily. 60 capsule 1   Glucagon, rDNA, (GLUCAGON EMERGENCY) 1 MG KIT Inject into the vein as directed. (Patient not taking: Reported on 05/14/2023)     HUMALOG  100 UNIT/ML injection SMARTSIG:80 Unit(s) SUB-Q Daily     HYDROcodone -acetaminophen  (NORCO/VICODIN) 5-325 MG tablet Take 1 tablet by mouth every 6 (six) hours as needed for moderate pain (pain score 4-6). 30 tablet 0   Insulin  Disposable Pump (OMNIPOD 5 G6 PODS, GEN 5,) MISC As directed 30 each PRN   Insulin  Lispro (HUMALOG  [place]) Inject into the skin as directed. For Insulin  pump  --   basal rate 1.4 units/ hr     pantoprazole  (PROTONIX ) 40 MG tablet Take 40 mg by mouth daily.     spironolactone  (ALDACTONE ) 50 MG tablet TAKE 1 TABLET BY MOUTH EVERY DAY 90 tablet 1   SUMAtriptan (IMITREX) 100 MG tablet Take 100 mg by mouth every 2 (two) hours as needed.     tiZANidine  (ZANAFLEX ) 4 MG tablet Take 1 tablet (4 mg total) by mouth every 8 (eight) hours as needed for muscle spasms. 60 tablet 0   Ubrogepant (UBRELVY) 100 MG TABS 100 mg daily as needed.     No current facility-administered medications for this visit.   No results found.  Review of Systems:   A ROS was performed including pertinent positives and negatives  as documented in the HPI.  Physical Exam :   Constitutional: NAD and appears stated age Neurological: Alert and oriented Psych: Appropriate affect and cooperative There were no vitals taken for this visit.   Comprehensive Musculoskeletal Exam:    Left shoulder with tenderness about the glenohumeral joint.  Active forward elevation is to approximately 50 degrees with pain external rotation at the side is to 20 degrees with pain.  Internal rotation is to L1 just neurosensory seems intact   Imaging:   Xray (3 views left shoulder): Normal  MRI (left shoulder): Inferior glenohumeral thickening consistent with adhesive capsulitis   I personally reviewed and interpreted the radiographs.   Assessment and Plan:   43 y.o. female with left shoulder pain consistent with adhesive capsulitis.  At today's visit I did recommend an initial anterior ultrasound-guided injection of the shoulder.  Will plan to proceed with this and I will see her back in 2 weeks to assess her progress I did also counsel her on wall climbs and gentle passive range of motion.  She will discontinue formal physical therapy at this time   I personally saw and evaluated the patient, and  participated in the management and treatment plan.  Elspeth Parker, MD Attending Physician, Orthopedic Surgery  This document was dictated using Dragon voice recognition software. A reasonable attempt at proof reading has been made to minimize errors.

## 2024-06-08 ENCOUNTER — Encounter (HOSPITAL_BASED_OUTPATIENT_CLINIC_OR_DEPARTMENT_OTHER): Payer: Self-pay

## 2024-06-08 ENCOUNTER — Ambulatory Visit (HOSPITAL_BASED_OUTPATIENT_CLINIC_OR_DEPARTMENT_OTHER): Payer: Self-pay | Admitting: Orthopaedic Surgery

## 2024-06-08 ENCOUNTER — Ambulatory Visit (INDEPENDENT_AMBULATORY_CARE_PROVIDER_SITE_OTHER): Admitting: Orthopaedic Surgery

## 2024-06-08 DIAGNOSIS — G8929 Other chronic pain: Secondary | ICD-10-CM

## 2024-06-08 DIAGNOSIS — M25512 Pain in left shoulder: Secondary | ICD-10-CM

## 2024-06-08 NOTE — Progress Notes (Signed)
 Chief Complaint: Left shoulder pain     History of Present Illness:   06/08/2024: Presents for follow-up status post left shoulder injection.  She got approximately 20% relief  [name] is a 43 y.o. female presents today with ongoing left shoulder pain.  She does have a history of significant type 1 diabetes.  She has noticed pain and decreased range of motion for the last 6 months which has been worsening.  She has seen Dr. Vernetta he did provide an posterior glenohumeral injection.  She is here today for further discussion as well as MRI discussion.    PMH/PSH/Family History/Social History/Meds/Allergies:    Past Medical History:  Diagnosis Date   Asthma    followed by pcp   Bipolar 2 disorder (HCC)    Chronic post-traumatic stress disorder (PTSD)    Factor V Leiden mutation (HCC) 04/2021   per pt had blood done by gyn dr estelle in her office  ;  only one episode DVT LLE 08/ 2022   Fibromyalgia    folllowed by pcp   GAD (generalized anxiety disorder)    GERD (gastroesophageal reflux disease)    no current meds.   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   History of diabetic ketoacidosis    hx multiple admission's for dka (last admission in care everywhere 07-08-2022 @ [facility] w/ mild DKA ,  right buttock abscess, and hyperglycemia   History of DVT of lower extremity    druing pregnancy   last one 04-29-2021  nonoccluded dvt LLE (per was 3 clots in same leg)   History of pregnancy induced hypertension    preeclampsia   Hx MRSA infection    Hx-TIA (transient ischemic attack) 09/2017   no residual's   Insulin  dependent type 1 diabetes mellitus (HCC) 1997   followed by [facility] --- liliana pitu NP;  (uncontrolled)  uses dexcom and has omnipod insulin   pump   (09-18-2022  fasting average blood sugar 80-220)   Insulin  pump in place    omnipod   MDD (major depressive disorder)    hx   Menorrhagia    Migraine    Panic disorder    Paroxysmal SVT (supraventricular tachycardia) (HCC) 06/2017   (09-18-2022  per pt no issues since 2018)   PCOS (polycystic ovarian syndrome)    Vitamin D  insufficiency    Past Surgical History:  Procedure Laterality Date   CESAREAN SECTION N/A 06/02/2021   Procedure: CESAREAN SECTION;  Surgeon: estelle Service, MD;  Location: [facility] LD ORS;  Service: Obstetrics;  Laterality: N/A;   W/  BILATERAL SALPINGECTOMY   CHALAZION EXCISION Left    age 9  (stye)   CHOLECYSTECTOMY, LAPAROSCOPIC  12/02/2006   @[facility]  by dr christella lunger   COLONOSCOPY  06/16/2017   DILATION AND EVACUATION  02/14/2007   @[facility]  by dr winfred   ESOPHAGOGASTRODUODENOSCOPY  11/17/2011   Procedure: ESOPHAGOGASTRODUODENOSCOPY (EGD);  Surgeon: Jerrell KYM Sol, MD;  Location: [facility];  Service: Endoscopy;  Laterality: N/A;   EXCISION OF BREAST BIOPSY Right    age 16  and reexcision for cancerous skin tag   (benign breast bx)   HYDRADENITIS EXCISION  04/30/2012   Procedure: EXCISION HYDRADENITIS GROIN;  Surgeon:  Vicenta DELENA Poli, MD;  Location:  SURGERY CENTER;  Service: General;  Laterality: Left;  wide excision hidradenitis bilateral thighs and Left groin   HYDRADENITIS EXCISION Left 01/13/2014   Procedure: WIDE EXCISION HIDRADENITIS LEFT AXILLA;  Surgeon: Vicenta DELENA Poli, MD;  Location: [facility] OR;  Service: General;  Laterality: Left;   HYSTEROSCOPY WITH D & C N/A 09/19/2022   Procedure: DILATATION AND CURETTAGE LELDON WITH MYOSURE;  Surgeon: Estelle Service, MD;  Location: [facility];  Service: Gynecology;  Laterality: N/A;   INCISION AND DRAINAGE ABSCESS Right 03/17/2019   Procedure: INCISION AND DRAINAGE RIGHT THIGH ABSCESS;  Surgeon: Vanderbilt Ned, MD;  Location: [facility] OR;  Service: General;  Laterality:  Right;   INTRAUTERINE DEVICE (IUD) INSERTION N/A 09/19/2022   Procedure: INTRAUTERINE DEVICE (IUD) INSERTION;  Surgeon: Estelle Service, MD;  Location: [facility];  Service: Gynecology;  Laterality: N/A;  Mirena    IRRIGATION AND DEBRIDEMENT ABSCESS Right 06/02/2014   Procedure: IRRIGATION AND DEBRIDEMENT ABSCESS;  Surgeon: Vicenta Poli, MD;  Location: [facility] OR;  Service: General;  Laterality: Right;   IRRIGATION AND DEBRIDEMENT ABSCESS Left 09/23/2014   Procedure: IRRIGATION AND DEBRIDEMENT ABSCESS/LEFT THIGH;  Surgeon: Dann Hummer, MD;  Location: [facility] OR;  Service: General;  Laterality: Left;   LEFT HEART CATHETERIZATION WITH CORONARY ANGIOGRAM N/A 07/14/2014   Procedure: LEFT HEART CATHETERIZATION WITH CORONARY ANGIOGRAM;  Surgeon: Peter M Swaziland, MD;  Location: [facility] CATH LAB;  Service: Cardiovascular;  Laterality: N/A;   TAYLOR BUNIONECTOMY Left 2006   TONSILLECTOMY AND ADENOIDECTOMY     age 32   WISDOM TOOTH EXTRACTION     Social History   Socioeconomic History   Marital status: Married    Spouse name: Parilee Hally   Number of children: Not on file   Years of education: Not on file   Highest education level: Not on file  Occupational History   Not on file  Tobacco Use   Smoking status: Every Day    Current packs/day: 0.50    Average packs/day: 0.5 packs/day for 20.0 years (10.0 ttl pk-yrs)    Types: Cigarettes   Smokeless tobacco: Never   Tobacco comments:    09-18-2022  per pt had stopped smoking 09-22-2020 but has restart approx 08/ 2023 1/2 ppd,  total average smoking 20 yrs  Vaping Use   Vaping status: Never Used  Substance and Sexual Activity   Alcohol use: No    Alcohol/week: 0.0 standard drinks of alcohol   Drug use: Not Currently    Types: Marijuana    Comment: 2020   Sexual activity: Yes    Partners: Male    Birth control/protection: Surgical    Comment: tubal w/ c/s 06-02-2021  Other Topics Concern   Not on file  Social History Narrative    Not on file   Social Drivers of Health   Financial Resource Strain: Medium Risk (09/03/2023)   Received from [facility]   Overall Financial Resource Strain (CARDIA)    Difficulty of Paying Living Expenses: Somewhat hard  Food Insecurity: Food Insecurity Present (09/03/2023)   Received from [facility]   Hunger Vital Sign    Within the past 12 months, you worried that your food would run out before you got the money to buy more.: Sometimes true    Within the past 12 months, the food you bought just didn't last and you didn't have money to get more.: Sometimes true  Transportation Needs: No Transportation Needs (09/03/2023)   Received  from [facility]   PRAPARE - Transportation    Lack of Transportation (Medical): No    Lack of Transportation (Non-Medical): No  Physical Activity: Insufficiently Active (09/03/2023)   Received from [facility]   Exercise Vital Sign    On average, how many days per week do you engage in moderate to strenuous exercise (like a brisk walk)?: 3 days    On average, how many minutes do you engage in exercise at this level?: 30 min  Stress: Stress Concern Present (09/03/2023)   Received from [facility] - Occupational Stress Questionnaire    Feeling of Stress : Very much  Social Connections: Somewhat Isolated (09/03/2023)   Received from [facility]   Social Network    How would you rate your social network (family, work, friends)?: Restricted participation with some degree of social isolation   Family History  Problem Relation Age of Onset   Cancer Mother    Anxiety disorder Mother    Depression Mother    Sexual abuse Mother    Breast cancer Mother 49   Hyperlipidemia Sister    Hypertension Sister    Sexual abuse Sister    Hypertension Father    Anxiety disorder Father    Depression Father    Drug abuse Father    Stroke Paternal Uncle    ADD / ADHD Paternal Uncle    Anxiety disorder  Paternal Uncle    Anxiety disorder Maternal Aunt    Seizures Maternal Aunt    Anxiety disorder Paternal Aunt    Anxiety disorder Maternal Uncle    ADD / ADHD Cousin    ADD / ADHD Daughter    Allergies  Allergen Reactions   Avandia [Rosiglitazone] Swelling    Swelling of face and legs   Avelox [Moxifloxacin] Shortness Of Breath and Rash   Latex Hives, Shortness Of Breath and Rash   Levaquin [Levofloxacin] Shortness Of Breath and Rash   Omalizumab  Anaphylaxis and Rash    (Xolair )    Oxycodone  Shortness Of Breath, Swelling and Rash    NORCO/ VICODIN OK   Peach [Prunus Persica] Hives and Shortness Of Breath   Potassium-Containing Compounds Other (See Comments)    IV ROUTE - CAUSES VEINS TO COLLAPS; Reports that it is undiluted K only   Prednisone  Other (See Comments)    SEVERE ELEVATION OF BLOOD SUGAR. Able to tolerate 40 mg   Propoxyphene N-Acetaminophen  Swelling    SWELLING OF FACE AND THROAT   Tomato Anaphylaxis    Raw tomato's //  cooked tomato's ok   Adhesive [Tape] Hives, Itching and Rash   Morphine  And Codeine  Other (See Comments)    Causes hallucinations   Prozac [Fluoxetine Hcl] Other (See Comments)    Made her very aggressive    Chantix  [Varenicline ]     dreams   Citrullus Vulgaris Nausea And Vomiting    Facial swelling   Humira [Adalimumab]     Does not remember    Zyvox [Linezolid] Swelling   Cefaclor Rash   Flagyl [Metronidazole] Rash   Keflex [Cephalexin] Diarrhea and Rash    REACTION: severe migraine   Promethazine  Hcl Other (See Comments)    IV ROUTE ONLY - JITTERY FEELING. Patient reports that it is mild and she has used promethazine  since then  PO tablet ok   Septra [Sulfamethoxazole-Trimethoprim] Rash   Sulfadiazine Rash   Current Outpatient Medications  Medication Sig Dispense Refill   acetaminophen  (TYLENOL ) 325 MG tablet Take  650 mg by mouth every 4 (four) hours as needed.     albuterol  (VENTOLIN  HFA) 108 (90 Base) MCG/ACT inhaler Inhale into  the lungs every 6 (six) hours as needed for wheezing or shortness of breath. (Patient not taking: Reported on 05/14/2023)     apixaban  (ELIQUIS ) 5 MG TABS tablet Take 1 tablet (5 mg total) by mouth 2 (two) times daily. 60 tablet 6   budesonide-formoterol (SYMBICORT) 160-4.5 MCG/ACT inhaler Inhale 2 puffs into the lungs as needed. (Patient not taking: Reported on 05/14/2023)     clindamycin  (CLINDAGEL) 1 % gel Apply topically 2 (two) times daily. 30 g 1   clonazePAM  (KLONOPIN ) 0.5 MG tablet Take 0.5 mg by mouth 2 (two) times daily as needed for anxiety. (Patient not taking: Reported on 05/14/2023)     doxycycline  (VIBRA -TABS) 100 MG tablet 1 daily as directed 30 tablet 1   gabapentin  (NEURONTIN ) 300 MG capsule Take 1 capsule (300 mg total) by mouth at bedtime. Take once at night for 5 days.  If tolerating well, can increase to twice daily. 60 capsule 1   Glucagon, rDNA, (GLUCAGON EMERGENCY) 1 MG KIT Inject into the vein as directed. (Patient not taking: Reported on 05/14/2023)     HUMALOG  100 UNIT/ML injection SMARTSIG:80 Unit(s) SUB-Q Daily     HYDROcodone -acetaminophen  (NORCO/VICODIN) 5-325 MG tablet Take 1 tablet by mouth every 6 (six) hours as needed for moderate pain (pain score 4-6). 30 tablet 0   Insulin  Disposable Pump (OMNIPOD 5 G6 PODS, GEN 5,) MISC As directed 30 each PRN   Insulin  Lispro (HUMALOG  Enoch) Inject into the skin as directed. For Insulin  pump  --   basal rate 1.4 units/ hr     pantoprazole  (PROTONIX ) 40 MG tablet Take 40 mg by mouth daily.     spironolactone  (ALDACTONE ) 50 MG tablet TAKE 1 TABLET BY MOUTH EVERY DAY 90 tablet 1   SUMAtriptan (IMITREX) 100 MG tablet Take 100 mg by mouth every 2 (two) hours as needed.     tiZANidine  (ZANAFLEX ) 4 MG tablet Take 1 tablet (4 mg total) by mouth every 8 (eight) hours as needed for muscle spasms. 60 tablet 0   Ubrogepant (UBRELVY) 100 MG TABS 100 mg daily as needed.     No current facility-administered medications for this visit.   No  results found.  Review of Systems:   A ROS was performed including pertinent positives and negatives as documented in the HPI.  Physical Exam :   Constitutional: NAD and appears stated age Neurological: Alert and oriented Psych: Appropriate affect and cooperative There were no vitals taken for this visit.   Comprehensive Musculoskeletal Exam:    Left shoulder with tenderness about the glenohumeral joint.  Active forward elevation is to approximately 50 degrees with pain external rotation at the side is to 20 degrees with pain.  Internal rotation is to L1 just neurosensory seems intact   Imaging:   Xray (3 views left shoulder): Normal  MRI (left shoulder): Inferior glenohumeral thickening consistent with adhesive capsulitis   I personally reviewed and interpreted the radiographs.   Assessment and Plan:   43 y.o. female with left shoulder pain consistent with adhesive capsulitis.  Unfortunately she only got approximately 20% relief from her injection.  Given this I do ultimately believe she be a candidate for left shoulder arthroscopy with lysis of adhesions and manipulation.  I did discuss risks limitations.  After discussion she would like to proceed  -Plan for left shoulder arthroscopy with lysis  of adhesions and manipulation under anesthesia   After a lengthy discussion of treatment options, including risks, benefits, alternatives, complications of surgical and nonsurgical conservative options, the patient elected surgical repair.   The patient  is aware of the material risks  and complications including, but not limited to injury to adjacent structures, neurovascular injury, infection, numbness, bleeding, implant failure, thermal burns, stiffness, persistent pain, failure to heal, disease transmission from allograft, need for further surgery, dislocation, anesthetic risks, blood clots, risks of death,and others. The probabilities of surgical success and failure discussed with  patient given their particular co-morbidities.The time and nature of expected rehabilitation and recovery was discussed.The patient's questions were all answered preoperatively.  No barriers to understanding were noted. I explained the natural history of the disease process and Rx rationale.  I explained to the patient what I considered to be reasonable expectations given their personal situation.  The final treatment plan was arrived at through a shared patient decision making process model.    I personally saw and evaluated the patient, and participated in the management and treatment plan.  Elspeth Parker, MD Attending Physician, Orthopedic Surgery  This document was dictated using Dragon voice recognition software. A reasonable attempt at proof reading has been made to minimize errors.

## 2024-07-21 ENCOUNTER — Ambulatory Visit (HOSPITAL_BASED_OUTPATIENT_CLINIC_OR_DEPARTMENT_OTHER): Payer: Self-pay | Admitting: Orthopaedic Surgery

## 2024-07-21 ENCOUNTER — Encounter (HOSPITAL_BASED_OUTPATIENT_CLINIC_OR_DEPARTMENT_OTHER): Payer: Self-pay | Admitting: Orthopaedic Surgery

## 2024-07-21 ENCOUNTER — Other Ambulatory Visit (HOSPITAL_BASED_OUTPATIENT_CLINIC_OR_DEPARTMENT_OTHER): Payer: Self-pay | Admitting: Orthopaedic Surgery

## 2024-07-21 DIAGNOSIS — M7502 Adhesive capsulitis of left shoulder: Secondary | ICD-10-CM | POA: Diagnosis not present

## 2024-07-21 MED ORDER — HYDROCODONE-ACETAMINOPHEN 5-325 MG PO TABS
1.0000 | ORAL_TABLET | Freq: Four times a day (QID) | ORAL | 0 refills | Status: AC | PRN
Start: 2024-07-21 — End: ?

## 2024-07-21 MED ORDER — ASPIRIN 325 MG PO TBEC: 325.0000 mg | DELAYED_RELEASE_TABLET | Freq: Every day | ORAL | 0 refills | Status: AC

## 2024-07-21 NOTE — Progress Notes (Signed)
   Date of Surgery: 07/21/2024  INDICATIONS: [name] is a 43 y.o.-year-old female with left shoulder adhesive capsulitis.  The risk and benefits of the procedure were discussed in detail and documented in the pre-operative evaluation.   PREOPERATIVE DIAGNOSES: Left shoulder, adhesive capsulitis.  POSTOPERATIVE DIAGNOSIS: Same.  PROCEDURE: Arthroscopic capsular release 29825  SURGEON: Barbara LITTIE Parker MD  ASSISTANT: Conley Funk, ATC  ANESTHESIA:  general  IV FLUIDS AND URINE: See anesthesia record.  ANTIBIOTICS: Ancef  ESTIMATED BLOOD LOSS: 5 mL.  IMPLANTS:  * No surgical log found *  DRAINS: None  CULTURES: None  COMPLICATIONS: none  PROCEDURE:    OPERATIVE FINDING: Exam under anesthesia:   Examination under anesthesia revealed forward elevation of 150 degrees.  With the arm at the side, there was 65 degrees of external rotation.  There is a 1+ anterior load shift and a 1+ posterior load shift.    Arthroscopic findings demonstrated: Articular space: Rotator interval redness/injection Chondral surfaces: Normal Biceps: Intact Subscapularis: Intact Supraspinatus: Normal Infraspinatus: Intact    I identified the patient in the pre-operative holding area.  I marked the operative right shoulder with my initials. I reviewed the risks and benefits of the proposed surgical intervention and the patient wished to proceed.  Anesthesia was then performed with regional block.  The patient was transferred to the operative suite and placed in the beach chair position with all bony prominences padded.     SCDs were placed on bilateral lower extremity. Appropriate antibiotics was administered within 1 hour before incision.  Anesthesia was induced.  The operative extremity was then prepped and draped in standard fashion. A time out was performed confirming the correct extremity, correct patient and correct procedure.   The arthroscope was introduced in the glenohumeral joint  from a posterior portal.  Barbara anterior portal was created.  The shoulder was examined and the above findings were noted.     With Barbara arthroscopic shaver and a wand ablator, synovitis throughout the  shoulder was resected.  The arthroscopic shaver was used to excise torn portions of the labrum back to a stable margin. Specifically this was done for the anterior and superior labrum as well as rotator interval. The rotator interval was debrided using shaver and electrocautery.   The shoulder was irrigated.  The arthroscopic instruments were removed.  Wounds were closed with 3-0 nylon sutures.  A sterile dressing was applied with xeroform, 4x8s, abdominal pad, and tape. [name] was placed and the upper extremity was placed in a shoulder immobilizer.  The patient tolerated the procedure well and was taken to the recovery room in stable condition.  All counts were correct in the case. The patient tolerated the procedure well and was taken to the recovery room in stable condition.    POSTOPERATIVE PLAN: She will be weight bearing activity as tolerated when the nerve block wears off. She will be on aspirin  for 2 weeks for DVT prophylaxis.  Barbara LITTIE Parker, MD 3:03 PM

## 2024-07-25 ENCOUNTER — Encounter: Payer: Self-pay | Admitting: Physical Therapy

## 2024-07-25 ENCOUNTER — Other Ambulatory Visit: Payer: Self-pay

## 2024-07-25 ENCOUNTER — Encounter: Payer: Self-pay | Admitting: Radiology

## 2024-07-25 ENCOUNTER — Ambulatory Visit: Attending: Orthopaedic Surgery | Admitting: Physical Therapy

## 2024-07-25 ENCOUNTER — Ambulatory Visit

## 2024-07-25 DIAGNOSIS — G8929 Other chronic pain: Secondary | ICD-10-CM | POA: Diagnosis present

## 2024-07-25 DIAGNOSIS — M7502 Adhesive capsulitis of left shoulder: Secondary | ICD-10-CM | POA: Insufficient documentation

## 2024-07-25 DIAGNOSIS — M25512 Pain in left shoulder: Secondary | ICD-10-CM | POA: Insufficient documentation

## 2024-07-25 DIAGNOSIS — M6281 Muscle weakness (generalized): Secondary | ICD-10-CM | POA: Diagnosis present

## 2024-07-25 DIAGNOSIS — M25612 Stiffness of left shoulder, not elsewhere classified: Secondary | ICD-10-CM | POA: Insufficient documentation

## 2024-07-25 NOTE — Therapy (Signed)
 OUTPATIENT PHYSICAL THERAPY SHOULDER EVALUATION   Patient Name: [name] MRN: [id number] DOB:[birth year], 43 y.o., female Today's Date: 07/25/2024  END OF SESSION:  PT End of Session - 07/25/24 1150     Visit Number 1    Date for Recertification  10/26/23    Authorization Type MCD HB    PT Start Time 1145    PT Stop Time 1230    PT Time Calculation (min) 45 min    Activity Tolerance Patient limited by pain    Behavior During Therapy [facility] for tasks assessed/performed          Past Medical History:  Diagnosis Date   Asthma    followed by pcp   Bipolar 2 disorder (HCC)    Chronic post-traumatic stress disorder (PTSD)    Factor V Leiden mutation 04/2021   per pt had blood done by gyn dr estelle in her office  ;  only one episode DVT LLE 08/ 2022   Fibromyalgia    folllowed by pcp   GAD (generalized anxiety disorder)    GERD (gastroesophageal reflux disease)    no current meds.   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   History of diabetic ketoacidosis    hx multiple admission's for dka (last admission in care everywhere 07-08-2022 @ [facility] w/ mild DKA ,  right buttock abscess, and hyperglycemia   History of DVT of lower extremity    druing pregnancy   last one 04-29-2021  nonoccluded dvt LLE (per was 3 clots in same leg)   History of pregnancy induced hypertension    preeclampsia   Hx MRSA infection    Hx-TIA (transient ischemic attack) 09/2017   no residual's   Insulin  dependent type 1 diabetes mellitus (HCC) 1997   followed by [facility] --- liliana pitu NP;  (uncontrolled)  uses dexcom and has omnipod insulin  pump   (09-18-2022  fasting average blood sugar 80-220)   Insulin  pump in place    omnipod   MDD (major depressive disorder)    hx    Menorrhagia    Migraine    Panic disorder    Paroxysmal SVT (supraventricular tachycardia) 06/2017   (09-18-2022  per pt no issues since 2018)   PCOS (polycystic ovarian syndrome)    Vitamin D  insufficiency    Past Surgical History:  Procedure Laterality Date   CESAREAN SECTION N/A 06/02/2021   Procedure: CESAREAN SECTION;  Surgeon: Estelle Service, MD;  Location: [facility] LD ORS;  Service: Obstetrics;  Laterality: N/A;   W/  BILATERAL SALPINGECTOMY   CHALAZION EXCISION Left    age 31  (stye)   CHOLECYSTECTOMY, LAPAROSCOPIC  12/02/2006   @[facility]  by dr christella lunger   COLONOSCOPY  06/16/2017   DILATION AND EVACUATION  02/14/2007   @[facility]  by dr winfred   ESOPHAGOGASTRODUODENOSCOPY  11/17/2011   Procedure: ESOPHAGOGASTRODUODENOSCOPY (EGD);  Surgeon: Jerrell KYM Sol, MD;  Location: [facility];  Service: Endoscopy;  Laterality: N/A;   EXCISION OF BREAST BIOPSY Right    age 76  and reexcision for cancerous skin tag   (benign breast bx)   HYDRADENITIS EXCISION  04/30/2012   Procedure: EXCISION HYDRADENITIS GROIN;  Surgeon: Vicenta DELENA Poli, MD;  Location: [facility];  Service: General;  Laterality: Left;  wide excision hidradenitis bilateral thighs and Left groin   HYDRADENITIS EXCISION Left 01/13/2014   Procedure: WIDE EXCISION HIDRADENITIS LEFT AXILLA;  Surgeon: Vicenta DELENA Poli, MD;  Location: [facility] OR;  Service: General;  Laterality: Left;   HYSTEROSCOPY WITH D & C N/A 09/19/2022   Procedure: DILATATION AND CURETTAGE LELDON WITH MYOSURE;  Surgeon: Estelle Service, MD;  Location: [facility];  Service: Gynecology;  Laterality: N/A;   INCISION AND DRAINAGE ABSCESS Right 03/17/2019   Procedure: INCISION AND DRAINAGE RIGHT THIGH ABSCESS;  Surgeon: Vanderbilt Ned, MD;  Location: [facility] OR;  Service: General;  Laterality: Right;   INTRAUTERINE DEVICE (IUD) INSERTION N/A 09/19/2022   Procedure: INTRAUTERINE DEVICE (IUD) INSERTION;  Surgeon: Estelle Service, MD;   Location: [facility] [place];  Service: Gynecology;  Laterality: N/A;  Mirena    IRRIGATION AND DEBRIDEMENT ABSCESS Right 06/02/2014   Procedure: IRRIGATION AND DEBRIDEMENT ABSCESS;  Surgeon: Vicenta Poli, MD;  Location: [facility] OR;  Service: General;  Laterality: Right;   IRRIGATION AND DEBRIDEMENT ABSCESS Left 09/23/2014   Procedure: IRRIGATION AND DEBRIDEMENT ABSCESS/LEFT THIGH;  Surgeon: Dann Hummer, MD;  Location: [facility] OR;  Service: General;  Laterality: Left;   LEFT HEART CATHETERIZATION WITH CORONARY ANGIOGRAM N/A 07/14/2014   Procedure: LEFT HEART CATHETERIZATION WITH CORONARY ANGIOGRAM;  Surgeon: Peter M Jordan, MD;  Location: [facility] CATH LAB;  Service: Cardiovascular;  Laterality: N/A;   TAYLOR BUNIONECTOMY Left 2006   TONSILLECTOMY AND ADENOIDECTOMY     age 31   WISDOM TOOTH EXTRACTION     Patient Active Problem List   Diagnosis Date Noted   Precordial chest pain 02/19/2023   S/P primary low transverse C-section 06/02/2021   Preeclampsia, severe 05/31/2021   PIH (pregnancy induced hypertension), third trimester 05/30/2021   Elevated blood pressure affecting pregnancy in third trimester, antepartum 05/24/2021   Difficulty coping 11/16/2020   Chronic posttraumatic stress disorder 04/21/2019   DKA (diabetic ketoacidosis) (HCC) 03/15/2019   Abscess of right thigh 03/15/2019   Bipolar 2 disorder (HCC) 01/04/2019   Panic disorder 01/04/2019   Enteritis 12/24/2017   Intractable nausea and vomiting 12/24/2017   Epigastric pain    Other chest pain 09/28/2017   TIA (transient ischemic attack) 09/28/2017   Paroxysmal SVT (supraventricular tachycardia) 06/24/2017   Dizziness 06/24/2017   Weakness 06/24/2017   Hyperglycemia 03/17/2016   Abscess 03/17/2016   Chest discomfort 07/10/2014   Cellulitis 06/01/2014   Cellulitis of right thigh 06/01/2014   Postop check 01/19/2014   Smoking addiction 08/29/2013   DKA, type 1 (HCC) 05/09/2013   Hidradenitis suppurativa 04/21/2012    Esophageal reflux 11/17/2011   Seasonal and perennial allergic rhinitis 05/20/2010   BRONCHITIS, ACUTE 11/29/2007   HYPONATREMIA 11/14/2007   GAD (generalized anxiety disorder) 10/24/2007   Sinus tachycardia 07/07/2007   Diabetes mellitus type I (HCC) 04/08/2007   Smoker 04/08/2007   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 White, MD  REFERRING PROVIDER: Genelle, MD  REFERRING DIAG: s/p left shoulder scope with adhesive capsulitis, with manipulation under anesthesia  THERAPY DIAG:  Acute pain of left shoulder  Stiffness of left shoulder, not elsewhere classified  Muscle weakness (generalized)  Rationale for Evaluation and Treatment: Rehabilitation  ONSET DATE: 07/21/24  SUBJECTIVE:  SUBJECTIVE STATEMENT: Patient reports that she has had left shoulder pain and loss of ROM for about a year, underwent a left shoulder scope and manipulation under anesthesia on 07/21/24.  Reports that she has been in the sling since Thursday, reports that she was not given exercises. Hand dominance: Right  PERTINENT HISTORY: Asthma, bipolar, PTSD, fibromyalgia, GERD, TIA, PCOS  PAIN:  Are you having pain? Yes: NPRS scale: 7/10 Pain location: left neck, left shoulder and upper arm Pain description: twisting, pulling Aggravating factors: movements, pain up to 10/10 Relieving factors: vicodin rest at best a 4/10  PRECAUTIONS: None  RED FLAGS: None   WEIGHT BEARING RESTRICTIONS: No  FALLS:  Has patient fallen in last 6 months? Yes. 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  DIAGNOSTIC FINDINGS:      Date of Surgery: 07/21/2024   INDICATIONS: Ms. Kellett is a 43 y.o.-year-old female with left shoulder adhesive capsulitis.  The risk and benefits of the procedure were discussed in detail and documented in the pre-operative evaluation.    PREOPERATIVE DIAGNOSES: Left shoulder, adhesive capsulitis.   POSTOPERATIVE DIAGNOSIS: Same.   PROCEDURE: Arthroscopic capsular release 29825   SURGEON: Elspeth LITTIE Parker MD   ASSISTANT: Conley Funk, ATC   ANESTHESIA:  general   IV FLUIDS AND URINE: See anesthesia record.   ANTIBIOTICS: Ancef   ESTIMATED BLOOD LOSS: 5 mL.   IMPLANTS:  * No surgical log found *   DRAINS: None   CULTURES: None   COMPLICATIONS: none   PROCEDURE:    OPERATIVE FINDING: Exam under anesthesia:    Examination under anesthesia revealed forward elevation of 150 degrees.  With the arm at the side, there was 65 degrees of external rotation.  There is a 1+ anterior load shift and a 1+ posterior load shift.      PATIENT SURVEYS:  Quickdash = 91%  COGNITION: Overall cognitive status: Within functional limits for tasks assessed     SENSATION: WFL  POSTURE: Guarded, arm in sling  UPPER EXTREMITY ROM: Patient would not move due to pain, would not allow PROM due to pain  Active ROM AROM Left 07/25/24 eval PROM Left 07/25/24 eval  Shoulder flexion 20 30  Shoulder extension    Shoulder abduction 10 20  Shoulder adduction    Shoulder internal rotation 20 20  Shoulder external rotation 0 0  Elbow flexion 90 100  Elbow extension 25 20  Wrist flexion    Wrist extension    Wrist ulnar deviation    Wrist radial deviation    Wrist pronation    Wrist supination     (Blank rows = not tested)  UPPER EXTREMITY MMT:  unable to test  MMT Right eval Left eval  Shoulder flexion    Shoulder extension    Shoulder abduction    Shoulder adduction    Shoulder internal rotation    Shoulder external rotation    Middle trapezius    Lower trapezius    Elbow flexion    Elbow extension    Wrist flexion  Wrist extension    Wrist ulnar deviation    Wrist radial deviation    Wrist pronation    Wrist supination    Grip strength (lbs)    (Blank rows = not tested)  JOINT MOBILITY TESTING:  No motions, guarded and she locks it down at the side  PALPATION:  Very tender to touch the upper trap, neck, rhomboid and upper arm                                                                                                                             TREATMENT DATE:  07/25/24 Evaluation HEP   PATIENT EDUCATION: Education details: POC/HEP Person educated: Patient Education method: Programmer, Multimedia, Facilities Manager, Actor cues, Verbal cues, and Handouts Education comprehension: verbalized understanding  HOME EXERCISE PROGRAM: Access Code: [email or web address] URL: [email or web address] Date: 07/25/2024 Prepared by: Ozell Mainland  Exercises - Seated Shoulder Flexion Towel Slide at Table Top  - 3 x daily - 7 x weekly - 1 sets - 10 reps - 10 hold - Seated Shoulder External Rotation PROM on Table  - 3 x daily - 7 x weekly - 1 sets - 10 reps - 10 hold - Seated Elbow Extension and Shoulder External Rotation AAROM at Table with Towel  - 3 x daily - 7 x weekly - 1 sets - 10 reps - 3 hold - Seated Shoulder Shrugs  - 3 x daily - 7 x weekly - 1 sets - 10 reps - 3 hold - Seated Scapular Retraction  - 3 x daily - 7 x weekly - 1 sets - 10 reps - 3 hold - Supine Active Assistive Single Arm Shoulder Protraction  - 3 x daily - 7 x weekly - 1 sets - 10 reps - 5 hold - Supine Shoulder Flexion AAROM with Hands Clasped  - 3 x daily - 7 x weekly - 1 sets - 10 reps - 10  hold - Standing Elbow Flexion Extension AROM  - 3 x daily - 7 x weekly - 1 sets - 10 reps - 3 hold  ASSESSMENT:  CLINICAL IMPRESSION: Patient is a 43 y.o. female who was seen today for physical therapy evaluation and treatment for s/p left shoulder scope with manipulation.  She presents in a sling and reports that she has been in the sling all weekend, She is very tender to palpation and is tight in the upper trap and neck, she really did not allow PROM, she was very guarded and had the left arm locked down against her body.  I explained the procedure and that Dr. Genelle had her to 150 degrees flexion and 60 degrees ER in the OR.  Explained to her that she has to start moving the arm, I gave her an HEP and really encouraged her to move the arm as she has no restrictions. Very poor ROM as noted above.  OBJECTIVE IMPAIRMENTS: decreased activity tolerance, decreased coordination, decreased endurance, decreased ROM, decreased strength, increased edema, increased  muscle spasms, impaired flexibility, impaired UE functional use, improper body mechanics, postural dysfunction, and pain.   REHAB POTENTIAL: Good  CLINICAL DECISION MAKING: Evolving/moderate complexity  EVALUATION COMPLEXITY: Moderate   GOALS: Goals reviewed with patient? Yes  SHORT TERM GOALS: Target date: 08/15/24  Independent with initial HEP Baseline: Goal status: INITIAL  LONG TERM GOALS: Target date: 10/25/24  Independent with advanced HEP Baseline:  Goal status: INITIAL  2.  Decrease pain 50% Baseline:  Goal status: INITIAL  3.  Improve quick dash score to 30% Baseline: 91% Goal status: INITIAL  4.  Decrease pain 50% Baseline: 7/10 up to 10/10 with motions Goal status: INITIAL  5.  Do hair and dress without difficulty Baseline: has help to dress and do hair Goal status: INITIAL  6.  Improve left shoulder flexion to 125 degrees Baseline: 20 degrees Goal status: INITIAL  PLAN:  PT FREQUENCY: 2x/week  PT  DURATION: 12 weeks  PLANNED INTERVENTIONS: 97164- PT Re-evaluation, 97110-Therapeutic exercises, 97530- Therapeutic activity, 97112- Neuromuscular re-education, 97535- Self Care, [id number]- Manual therapy, Patient/Family education, Taping, Joint mobilization, Cryotherapy, and Moist heat  PLAN FOR NEXT SESSION: really need to get her moving   OBADIAH OZELL ORN, PT 07/25/2024, 11:51 AM

## 2024-07-28 ENCOUNTER — Encounter: Payer: Self-pay | Admitting: Physical Therapy

## 2024-07-28 ENCOUNTER — Ambulatory Visit: Admitting: Physical Therapy

## 2024-07-28 DIAGNOSIS — M25512 Pain in left shoulder: Secondary | ICD-10-CM | POA: Diagnosis not present

## 2024-07-28 DIAGNOSIS — G8929 Other chronic pain: Secondary | ICD-10-CM

## 2024-07-28 DIAGNOSIS — M25612 Stiffness of left shoulder, not elsewhere classified: Secondary | ICD-10-CM

## 2024-07-28 DIAGNOSIS — M7502 Adhesive capsulitis of left shoulder: Secondary | ICD-10-CM

## 2024-07-28 DIAGNOSIS — M6281 Muscle weakness (generalized): Secondary | ICD-10-CM

## 2024-07-28 NOTE — Therapy (Signed)
 OUTPATIENT PHYSICAL THERAPY SHOULDER EVALUATION   Patient Name: [name] MRN: [id number] DOB:1981/09/03, 43 y.o., female Today's Date: 07/28/2024  END OF SESSION:  PT End of Session - 07/28/24 1515     Visit Number 2    Date for Recertification  10/26/23    PT Start Time 1515    PT Stop Time 1600    PT Time Calculation (min) 45 min    Activity Tolerance Patient limited by pain    Behavior During Therapy [facility] for tasks assessed/performed          Past Medical History:  Diagnosis Date   Asthma    followed by pcp   Bipolar 2 disorder (HCC)    Chronic post-traumatic stress disorder (PTSD)    Factor V Leiden mutation 04/2021   per pt had blood done by gyn dr estelle in her office  ;  only one episode DVT LLE 08/ 2022   Fibromyalgia    folllowed by pcp   GAD (generalized anxiety disorder)    GERD (gastroesophageal reflux disease)    no current meds.   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   History of diabetic ketoacidosis    hx multiple admission's for dka (last admission in care everywhere 07-08-2022 @ [facility] w/ mild DKA ,  right buttock abscess, and hyperglycemia   History of DVT of lower extremity    druing pregnancy   last one 04-29-2021  nonoccluded dvt LLE (per was 3 clots in same leg)   History of pregnancy induced hypertension    preeclampsia   Hx MRSA infection    Hx-TIA (transient ischemic attack) 09/2017   no residual's   Insulin  dependent type 1 diabetes mellitus (HCC) 1997   followed by [facility] --- liliana pitu NP;  (uncontrolled)  uses dexcom and has omnipod insulin  pump   (09-18-2022  fasting average blood sugar 80-220)   Insulin  pump in place    omnipod   MDD (major depressive disorder)    hx   Menorrhagia    Migraine    Panic  disorder    Paroxysmal SVT (supraventricular tachycardia) 06/2017   (09-18-2022  per pt no issues since 2018)   PCOS (polycystic ovarian syndrome)    Vitamin D  insufficiency    Past Surgical History:  Procedure Laterality Date   CESAREAN SECTION N/A 06/02/2021   Procedure: CESAREAN SECTION;  Surgeon: Estelle Service, MD;  Location: [facility] LD ORS;  Service: Obstetrics;  Laterality: N/A;   W/  BILATERAL SALPINGECTOMY   CHALAZION EXCISION Left    age 47  (stye)   CHOLECYSTECTOMY, LAPAROSCOPIC  12/02/2006   @[facility]  by dr christella lunger   COLONOSCOPY  06/16/2017   DILATION AND EVACUATION  02/14/2007   @[facility]  by dr winfred   ESOPHAGOGASTRODUODENOSCOPY  11/17/2011   Procedure: ESOPHAGOGASTRODUODENOSCOPY (EGD);  Surgeon: Jerrell KYM Sol, MD;  Location: [facility];  Service: Endoscopy;  Laterality: N/A;   EXCISION OF BREAST BIOPSY Right    age 24  and reexcision for cancerous skin tag   (benign breast bx)   HYDRADENITIS EXCISION  04/30/2012   Procedure: EXCISION HYDRADENITIS GROIN;  Surgeon: Vicenta DELENA Poli, MD;  Location: [facility];  Service: General;  Laterality: Left;  wide excision hidradenitis bilateral thighs and Left groin   HYDRADENITIS EXCISION Left 01/13/2014   Procedure: WIDE EXCISION HIDRADENITIS LEFT AXILLA;  Surgeon: Vicenta DELENA Poli, MD;  Location: [facility] OR;  Service: General;  Laterality: Left;   HYSTEROSCOPY WITH D & C N/A 09/19/2022   Procedure: DILATATION AND CURETTAGE LELDON WITH MYOSURE;  Surgeon: Estelle Service, MD;  Location: [facility];  Service: Gynecology;  Laterality: N/A;   INCISION AND DRAINAGE ABSCESS Right 03/17/2019   Procedure: INCISION AND DRAINAGE RIGHT THIGH ABSCESS;  Surgeon: Vanderbilt Ned, MD;  Location: [facility] OR;  Service: General;  Laterality: Right;   INTRAUTERINE DEVICE (IUD) INSERTION N/A 09/19/2022   Procedure: INTRAUTERINE DEVICE (IUD) INSERTION;  Surgeon: Estelle Service, MD;  [facility];  Service: Gynecology;  Laterality: N/A;  Mirena    IRRIGATION AND DEBRIDEMENT ABSCESS Right 06/02/2014   Procedure: IRRIGATION AND DEBRIDEMENT ABSCESS;  Surgeon: Vicenta Poli, MD;  Location: [facility] OR;  Service: General;  Laterality: Right;   IRRIGATION AND DEBRIDEMENT ABSCESS Left 09/23/2014   Procedure: IRRIGATION AND DEBRIDEMENT ABSCESS/LEFT THIGH;  Surgeon: Dann Hummer, MD;  Location: [facility] OR;  Service: General;  Laterality: Left;   LEFT HEART CATHETERIZATION WITH CORONARY ANGIOGRAM N/A 07/14/2014   Procedure: LEFT HEART CATHETERIZATION WITH CORONARY ANGIOGRAM;  Surgeon: Peter M Jordan, MD;  Location: [facility] CATH LAB;  Service: Cardiovascular;  Laterality: N/A;   TAYLOR BUNIONECTOMY Left 2006   TONSILLECTOMY AND ADENOIDECTOMY     age 77   WISDOM TOOTH EXTRACTION     Patient Active Problem List   Diagnosis Date Noted   Precordial chest pain 02/19/2023   S/P primary low transverse C-section 06/02/2021   Preeclampsia, severe 05/31/2021   PIH (pregnancy induced hypertension), third trimester 05/30/2021   Elevated blood pressure affecting pregnancy in third trimester, antepartum 05/24/2021   Difficulty coping 11/16/2020   Chronic posttraumatic stress disorder 04/21/2019   DKA (diabetic ketoacidosis) (HCC) 03/15/2019   Abscess of right thigh 03/15/2019   Bipolar 2 disorder (HCC) 01/04/2019   Panic disorder 01/04/2019   Enteritis 12/24/2017   Intractable nausea and vomiting 12/24/2017   Epigastric pain    Other chest pain 09/28/2017   TIA (transient ischemic attack) 09/28/2017   Paroxysmal SVT (supraventricular tachycardia) 06/24/2017   Dizziness 06/24/2017   Weakness 06/24/2017   Hyperglycemia 03/17/2016   Abscess 03/17/2016   Chest discomfort 07/10/2014   Cellulitis 06/01/2014   Cellulitis of right thigh 06/01/2014   Postop check 01/19/2014   Smoking addiction 08/29/2013   DKA, type 1 (HCC) 05/09/2013   Hidradenitis suppurativa 04/21/2012   Esophageal reflux 11/17/2011    Seasonal and perennial allergic rhinitis 05/20/2010   BRONCHITIS, ACUTE 11/29/2007   HYPONATREMIA 11/14/2007   GAD (generalized anxiety disorder) 10/24/2007   Sinus tachycardia 07/07/2007   Diabetes mellitus type I (HCC) 04/08/2007   Smoker 04/08/2007   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 White, MD  REFERRING PROVIDER: Genelle, MD  REFERRING DIAG: s/p left shoulder scope with adhesive capsulitis, with manipulation under anesthesia  THERAPY DIAG:  Acute pain of left shoulder  Stiffness of left shoulder, not elsewhere classified  Muscle weakness (generalized)  Chronic left shoulder pain  Adhesive capsulitis of left shoulder  Rationale for Evaluation and Treatment: Rehabilitation  ONSET DATE: 07/21/24  SUBJECTIVE:  SUBJECTIVE STATEMENT:  A little bit better, to a shot of Toradol  Tuesday and that helped with inflammation and pain   Patient reports that she has had left shoulder pain and loss of ROM for about a year, underwent a left shoulder scope and manipulation under anesthesia on 07/21/24.  Reports that she has been in the sling since Thursday, reports that she was not given exercises. Hand dominance: Right  PERTINENT HISTORY: Asthma, bipolar, PTSD, fibromyalgia, GERD, TIA, PCOS  PAIN:  Are you having pain? Yes: NPRS scale: 2-3/10 Pain location: left neck, left shoulder and upper arm Pain description: twisting, pulling Aggravating factors: overuse  Relieving factors: vicodin rest at best a 4/10  PRECAUTIONS: None  RED FLAGS: None   WEIGHT BEARING RESTRICTIONS: No  FALLS:  Has patient fallen in last 6 months? Yes. 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  DIAGNOSTIC FINDINGS:      Date of Surgery: 07/21/2024   INDICATIONS: Ms. Counts is a 43 y.o.-year-old female with left shoulder adhesive capsulitis.  The risk and benefits of the procedure were discussed in detail and documented in the pre-operative evaluation.    PREOPERATIVE DIAGNOSES: Left shoulder, adhesive capsulitis.   POSTOPERATIVE DIAGNOSIS: Same.   PROCEDURE: Arthroscopic capsular release 29825   SURGEON: Elspeth LITTIE Parker MD   ASSISTANT: Conley Funk, ATC   ANESTHESIA:  general   IV FLUIDS AND URINE: See anesthesia record.   ANTIBIOTICS: Ancef   ESTIMATED BLOOD LOSS: 5 mL.   IMPLANTS:  * No surgical log found *   DRAINS: None   CULTURES: None   COMPLICATIONS: none   PROCEDURE:    OPERATIVE FINDING: Exam under anesthesia:    Examination under anesthesia revealed forward elevation of 150 degrees.  With the arm at the side, there was 65 degrees of external rotation.  There is a 1+ anterior load shift and a 1+ posterior load shift.      PATIENT SURVEYS:  Quickdash = 91%  COGNITION: Overall cognitive status: Within functional limits for tasks assessed     SENSATION: WFL  POSTURE: Guarded, arm in sling  UPPER EXTREMITY ROM: Patient would not move due to pain, would not allow PROM due to pain  Active ROM AROM Left 07/25/24 eval PROM Left 07/25/24 eval  Shoulder flexion 20 30  Shoulder extension    Shoulder abduction 10 20  Shoulder adduction    Shoulder internal rotation 20 20  Shoulder external rotation 0 0  Elbow flexion 90 100  Elbow extension 25 20  Wrist  flexion    Wrist extension    Wrist ulnar deviation    Wrist radial deviation    Wrist pronation    Wrist supination    (Blank rows = not tested)  UPPER EXTREMITY MMT:  unable to test  MMT Right eval Left eval  Shoulder flexion    Shoulder extension    Shoulder abduction    Shoulder adduction    Shoulder internal rotation    Shoulder external rotation    Middle trapezius    Lower trapezius  Elbow flexion    Elbow extension    Wrist flexion    Wrist extension    Wrist ulnar deviation    Wrist radial deviation    Wrist pronation    Wrist supination    Grip strength (lbs)    (Blank rows = not tested)  JOINT MOBILITY TESTING:  No motions, guarded and she locks it down at the side  PALPATION:  Very tender to touch the upper trap, neck, rhomboid and upper arm                                                                                                                             TREATMENT DATE:  07/28/24 AAROM Flex, Ext, IR up back x10 UBE L 1 x 2 min each Rows green 2x10 Extensions red 2x10 LUE IR yellow 2x10 LUE ER yellow 2x10 LUE PROM w/ end range holds in all directions  L GH JT mobs   07/25/24 Evaluation HEP   PATIENT EDUCATION: Education details: POC/HEP Person educated: Patient Education method: Programmer, Multimedia, Facilities Manager, Actor cues, Verbal cues, and Handouts Education comprehension: verbalized understanding  HOME EXERCISE PROGRAM: Access Code: [email or web address] URL: [email or web address] Date: 07/25/2024 Prepared by: Ozell Mainland  Exercises - Seated Shoulder Flexion Towel Slide at Table Top  - 3 x daily - 7 x weekly - 1 sets - 10 reps - 10 hold - Seated Shoulder External Rotation PROM on Table  - 3 x daily - 7 x weekly - 1 sets - 10 reps - 10 hold - Seated Elbow Extension and Shoulder External Rotation AAROM at Table with Towel  - 3 x daily - 7 x weekly - 1 sets - 10 reps - 3 hold - Seated Shoulder Shrugs  - 3 x daily - 7 x  weekly - 1 sets - 10 reps - 3 hold - Seated Scapular Retraction  - 3 x daily - 7 x weekly - 1 sets - 10 reps - 3 hold - Supine Active Assistive Single Arm Shoulder Protraction  - 3 x daily - 7 x weekly - 1 sets - 10 reps - 5 hold - Supine Shoulder Flexion AAROM with Hands Clasped  - 3 x daily - 7 x weekly - 1 sets - 10 reps - 10 hold - Standing Elbow Flexion Extension AROM  - 3 x daily - 7 x weekly - 1 sets - 10 reps - 3 hold  ASSESSMENT:  CLINICAL IMPRESSION: Patient is a 43 y.o. female who was seen today for physical therapy treatment for s/p left shoulder scope with manipulation.  She presents better with decrease pain and increase mobility. Pt repots receiving a Toradol  injection earlier this week. Progressed with interventions to promote LUE ROM and strength. Postural cues needed with rows and extensions. Weakness noted with ER/IR. Pain at end range with PROM, ER is the most limited.    OBJECTIVE IMPAIRMENTS: decreased activity tolerance, decreased coordination, decreased endurance, decreased ROM, decreased strength, increased edema, increased  muscle spasms, impaired flexibility, impaired UE functional use, improper body mechanics, postural dysfunction, and pain.   REHAB POTENTIAL: Good  CLINICAL DECISION MAKING: Evolving/moderate complexity  EVALUATION COMPLEXITY: Moderate   GOALS: Goals reviewed with patient? Yes  SHORT TERM GOALS: Target date: 08/15/24  Independent with initial HEP Baseline: Goal status: Progressing 07/28/24  LONG TERM GOALS: Target date: 10/25/24  Independent with advanced HEP Baseline:  Goal status: INITIAL  2.  Decrease pain 50% Baseline:  Goal status: INITIAL  3.  Improve quick dash score to 30% Baseline: 91% Goal status: INITIAL  4.  Decrease pain 50% Baseline: 7/10 up to 10/10 with motions Goal status: INITIAL  5.  Do hair and dress without difficulty Baseline: has help to dress and do hair Goal status: INITIAL  6.  Improve left  shoulder flexion to 125 degrees Baseline: 20 degrees Goal status: INITIAL  PLAN:  PT FREQUENCY: 2x/week  PT DURATION: 12 weeks  PLANNED INTERVENTIONS: 97164- PT Re-evaluation, 97110-Therapeutic exercises, 97530- Therapeutic activity, 97112- Neuromuscular re-education, 97535- Self Care, [id number]- Manual therapy, Patient/Family education, Taping, Joint mobilization, Cryotherapy, and Moist heat  PLAN FOR NEXT SESSION: really need to get her moving   Tanda KANDICE Sorrow, PTA 07/28/2024, 3:16 PM

## 2024-08-01 ENCOUNTER — Ambulatory Visit: Admitting: Physical Therapy

## 2024-08-01 ENCOUNTER — Telehealth (HOSPITAL_BASED_OUTPATIENT_CLINIC_OR_DEPARTMENT_OTHER): Payer: Self-pay | Admitting: Orthopaedic Surgery

## 2024-08-01 ENCOUNTER — Encounter: Payer: Self-pay | Admitting: Physical Therapy

## 2024-08-01 DIAGNOSIS — M25512 Pain in left shoulder: Secondary | ICD-10-CM

## 2024-08-01 DIAGNOSIS — M25612 Stiffness of left shoulder, not elsewhere classified: Secondary | ICD-10-CM

## 2024-08-01 DIAGNOSIS — M6281 Muscle weakness (generalized): Secondary | ICD-10-CM

## 2024-08-01 DIAGNOSIS — G8929 Other chronic pain: Secondary | ICD-10-CM

## 2024-08-01 DIAGNOSIS — M7502 Adhesive capsulitis of left shoulder: Secondary | ICD-10-CM

## 2024-08-01 NOTE — Therapy (Signed)
 OUTPATIENT PHYSICAL THERAPY SHOULDER EVALUATION   Patient Name: [name] MRN: [id number] DOB:[birth year], 43 y.o., female Today's Date: 08/01/2024  END OF SESSION:  PT End of Session - 08/01/24 1513     Visit Number 3    Date for Recertification  10/26/23    PT Start Time 1515    PT Stop Time 1600    PT Time Calculation (min) 45 min    Activity Tolerance Patient tolerated treatment well    Behavior During Therapy [facility] for tasks assessed/performed          Past Medical History:  Diagnosis Date   Asthma    followed by pcp   Bipolar 2 disorder (HCC)    Chronic post-traumatic stress disorder (PTSD)    Factor V Leiden mutation 04/2021   per pt had blood done by gyn dr estelle in her office  ;  only one episode DVT LLE 08/ 2022   Fibromyalgia    folllowed by pcp   GAD (generalized anxiety disorder)    GERD (gastroesophageal reflux disease)    no current meds.   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   History of diabetic ketoacidosis    hx multiple admission's for dka (last admission in care everywhere 07-08-2022 @ [facility] w/ mild DKA ,  right buttock abscess, and hyperglycemia   History of DVT of lower extremity    druing pregnancy   last one 04-29-2021  nonoccluded dvt LLE (per was 3 clots in same leg)   History of pregnancy induced hypertension    preeclampsia   Hx MRSA infection    Hx-TIA (transient ischemic attack) 09/2017   no residual's   Insulin  dependent type 1 diabetes mellitus (HCC) 1997   followed by [facility] --- liliana pitu NP;  (uncontrolled)  uses dexcom and has omnipod insulin  pump   (09-18-2022  fasting average blood sugar 80-220)   Insulin  pump in place    omnipod   MDD (major depressive disorder)    hx   Menorrhagia    Migraine     Panic disorder    Paroxysmal SVT (supraventricular tachycardia) 06/2017   (09-18-2022  per pt no issues since 2018)   PCOS (polycystic ovarian syndrome)    Vitamin D  insufficiency    Past Surgical History:  Procedure Laterality Date   CESAREAN SECTION N/A 06/02/2021   Procedure: CESAREAN SECTION;  Surgeon: Estelle Service, MD;  Location: [facility] LD ORS;  Service: Obstetrics;  Laterality: N/A;   W/  BILATERAL SALPINGECTOMY   CHALAZION EXCISION Left    age 64  (stye)   CHOLECYSTECTOMY, LAPAROSCOPIC  12/02/2006   @[facility]  by dr christella lunger   COLONOSCOPY  06/16/2017   DILATION AND EVACUATION  02/14/2007   @[facility]  by dr winfred   ESOPHAGOGASTRODUODENOSCOPY  11/17/2011   Procedure: ESOPHAGOGASTRODUODENOSCOPY (EGD);  Surgeon: Jerrell KYM Sol, MD;  Location: [facility];  Service: Endoscopy;  Laterality: N/A;   EXCISION OF BREAST BIOPSY Right    age 30  and reexcision for cancerous skin tag   (benign breast bx)   HYDRADENITIS EXCISION  04/30/2012   Procedure: EXCISION HYDRADENITIS GROIN;  Surgeon: Vicenta DELENA Poli, MD;  Location: [facility];  Service: General;  Laterality: Left;  wide excision hidradenitis bilateral thighs and Left groin   HYDRADENITIS EXCISION Left 01/13/2014   Procedure: WIDE EXCISION HIDRADENITIS LEFT AXILLA;  Surgeon: Vicenta DELENA Poli, MD;  Location: [facility] OR;  Service: General;  Laterality: Left;   HYSTEROSCOPY WITH D & C N/A 09/19/2022   Procedure: DILATATION AND CURETTAGE LELDON WITH MYOSURE;  Surgeon: Estelle Service, MD;  Location: [facility] [place];  Service: Gynecology;  Laterality: N/A;   INCISION AND DRAINAGE ABSCESS Right 03/17/2019   Procedure: INCISION AND DRAINAGE RIGHT THIGH ABSCESS;  Surgeon: Vanderbilt Ned, MD;  Location: [facility] OR;  Service: General;  Laterality: Right;   INTRAUTERINE DEVICE (IUD) INSERTION N/A 09/19/2022   Procedure: INTRAUTERINE DEVICE (IUD) INSERTION;  Surgeon: Estelle Service, MD;  Location: [facility];  Service: Gynecology;  Laterality: N/A;  Mirena    IRRIGATION AND DEBRIDEMENT ABSCESS Right 06/02/2014   Procedure: IRRIGATION AND DEBRIDEMENT ABSCESS;  Surgeon: Vicenta Poli, MD;  Location: [facility] OR;  Service: General;  Laterality: Right;   IRRIGATION AND DEBRIDEMENT ABSCESS Left 09/23/2014   Procedure: IRRIGATION AND DEBRIDEMENT ABSCESS/LEFT THIGH;  Surgeon: Dann Hummer, MD;  Location: [facility] OR;  Service: General;  Laterality: Left;   LEFT HEART CATHETERIZATION WITH CORONARY ANGIOGRAM N/A 07/14/2014   Procedure: LEFT HEART CATHETERIZATION WITH CORONARY ANGIOGRAM;  Surgeon: Peter M Jordan, MD;  Location: [facility] CATH LAB;  Service: Cardiovascular;  Laterality: N/A;   TAYLOR BUNIONECTOMY Left 2006   TONSILLECTOMY AND ADENOIDECTOMY     age 37   WISDOM TOOTH EXTRACTION     Patient Active Problem List   Diagnosis Date Noted   Precordial chest pain 02/19/2023   S/P primary low transverse C-section 06/02/2021   Preeclampsia, severe 05/31/2021   PIH (pregnancy induced hypertension), third trimester 05/30/2021   Elevated blood pressure affecting pregnancy in third trimester, antepartum 05/24/2021   Difficulty coping 11/16/2020   Chronic posttraumatic stress disorder 04/21/2019   DKA (diabetic ketoacidosis) (HCC) 03/15/2019   Abscess of right thigh 03/15/2019   Bipolar 2 disorder (HCC) 01/04/2019   Panic disorder 01/04/2019   Enteritis 12/24/2017   Intractable nausea and vomiting 12/24/2017   Epigastric pain    Other chest pain 09/28/2017   TIA (transient ischemic attack) 09/28/2017   Paroxysmal SVT (supraventricular tachycardia) 06/24/2017   Dizziness 06/24/2017   Weakness 06/24/2017   Hyperglycemia 03/17/2016   Abscess 03/17/2016   Chest discomfort 07/10/2014   Cellulitis 06/01/2014   Cellulitis of right thigh 06/01/2014   Postop check 01/19/2014   Smoking addiction 08/29/2013   DKA, type 1 (HCC) 05/09/2013   Hidradenitis suppurativa 04/21/2012   Esophageal reflux  11/17/2011   Seasonal and perennial allergic rhinitis 05/20/2010   BRONCHITIS, ACUTE 11/29/2007   HYPONATREMIA 11/14/2007   GAD (generalized anxiety disorder) 10/24/2007   Sinus tachycardia 07/07/2007   Diabetes mellitus type I (HCC) 04/08/2007   Smoker 04/08/2007   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 White, MD  REFERRING PROVIDER: Genelle, MD  REFERRING DIAG: s/p left shoulder scope with adhesive capsulitis, with manipulation under anesthesia  THERAPY DIAG:  Acute pain of left shoulder  Stiffness of left shoulder, not elsewhere classified  Muscle weakness (generalized)  Chronic left shoulder pain  Adhesive capsulitis of left shoulder  Rationale for Evaluation and Treatment: Rehabilitation  ONSET DATE: 07/21/24  SUBJECTIVE:  SUBJECTIVE STATEMENT: Increase pain today that started Saturday night early Saturday morning. Horrible today. Acute visit tomorrow, post op visit thrsday   Patient reports that she has had left shoulder pain and loss of ROM for about a year, underwent a left shoulder scope and manipulation under anesthesia on 07/21/24.  Reports that she has been in the sling since Thursday, reports that she was not given exercises. Hand dominance: Right  PERTINENT HISTORY: Asthma, bipolar, PTSD, fibromyalgia, GERD, TIA, PCOS  PAIN:  Are you having pain? Yes: NPRS scale: 8-9/10 Pain location: left neck, left shoulder and upper arm Pain description: twisting, pulling Aggravating factors: overuse  Relieving factors: vicodin rest at best a 4/10  PRECAUTIONS: None  RED FLAGS: None   WEIGHT BEARING RESTRICTIONS: No  FALLS:  Has patient fallen in last 6 months? Yes. 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  DIAGNOSTIC FINDINGS:      Date of Surgery: 07/21/2024   INDICATIONS: Ms. [name] is a 43 y.o.-year-old female with left shoulder adhesive capsulitis.  The risk and benefits of the procedure were discussed in detail and documented in the pre-operative evaluation.    PREOPERATIVE DIAGNOSES: Left shoulder, adhesive capsulitis.   POSTOPERATIVE DIAGNOSIS: Same.   PROCEDURE: Arthroscopic capsular release 29825   SURGEON: Elspeth LITTIE Parker MD   ASSISTANT: Conley Funk, ATC   ANESTHESIA:  general   IV FLUIDS AND URINE: See anesthesia record.   ANTIBIOTICS: Ancef   ESTIMATED BLOOD LOSS: 5 mL.   IMPLANTS:  * No surgical log found *   DRAINS: None   CULTURES: None   COMPLICATIONS: none   PROCEDURE:    OPERATIVE FINDING: Exam under anesthesia:    Examination under anesthesia revealed forward elevation of 150 degrees.  With the arm at the side, there was 65 degrees of external rotation.  There is a 1+ anterior load shift and a 1+ posterior load shift.      PATIENT SURVEYS:  Quickdash = 91%  COGNITION: Overall cognitive status: Within functional limits for tasks assessed     SENSATION: WFL  POSTURE: Guarded, arm in sling  UPPER EXTREMITY ROM: Patient would not move due to pain, would not allow PROM due to pain  Active ROM AROM Left 07/25/24 eval PROM Left 07/25/24 eval  Shoulder flexion 20 30  Shoulder extension    Shoulder abduction 10 20  Shoulder adduction    Shoulder internal rotation 20 20  Shoulder external rotation 0 0   Elbow flexion 90 100  Elbow extension 25 20  Wrist flexion    Wrist extension    Wrist ulnar deviation    Wrist radial deviation    Wrist pronation    Wrist supination    (Blank rows = not tested)  UPPER EXTREMITY MMT:  unable to test  MMT Right eval Left eval  Shoulder flexion    Shoulder extension    Shoulder abduction    Shoulder adduction    Shoulder internal rotation    Shoulder external rotation    Middle trapezius    Lower trapezius  Elbow flexion    Elbow extension    Wrist flexion    Wrist extension    Wrist ulnar deviation    Wrist radial deviation    Wrist pronation    Wrist supination    Grip strength (lbs)    (Blank rows = not tested)  JOINT MOBILITY TESTING:  No motions, guarded and she locks it down at the side  PALPATION:  Very tender to touch the upper trap, neck, rhomboid and upper arm                                                                                                                             TREATMENT DATE:  08/01/24 NuStep L3 x 5 min LUE flex, circles, & abd pillowcase on wall x10 Rows 20lb 2x10 & lats x5 AAROM Flex, Abd, Ext, IR up back x10 AROM shoulder abd 2x10 AROM shoulder flex 2x10 LUE IR yellow 2x10 LUE ER yellow 2x10  07/28/24 AAROM Flex, Ext, IR up back x10 UBE L 1 x 2 min each Rows green 2x10 Extensions red 2x10 LUE IR yellow 2x10 LUE ER yellow 2x10 LUE PROM w/ end range holds in all directions  L GH JT mobs   07/25/24 Evaluation HEP   PATIENT EDUCATION: Education details: POC/HEP Person educated: Patient Education method: Programmer, Multimedia, Facilities Manager, Actor cues, Verbal cues, and Handouts Education comprehension: verbalized understanding  HOME EXERCISE PROGRAM: Access Code: [email or web address] URL: [email or web address] Date: 07/25/2024 Prepared by: Ozell Mainland  Exercises - Seated Shoulder Flexion Towel Slide at Table Top  - 3 x daily - 7 x weekly - 1 sets - 10 reps - 10 hold -  Seated Shoulder External Rotation PROM on Table  - 3 x daily - 7 x weekly - 1 sets - 10 reps - 10 hold - Seated Elbow Extension and Shoulder External Rotation AAROM at Table with Towel  - 3 x daily - 7 x weekly - 1 sets - 10 reps - 3 hold - Seated Shoulder Shrugs  - 3 x daily - 7 x weekly - 1 sets - 10 reps - 3 hold - Seated Scapular Retraction  - 3 x daily - 7 x weekly - 1 sets - 10 reps - 3 hold - Supine Active Assistive Single Arm Shoulder Protraction  - 3 x daily - 7 x weekly - 1 sets - 10 reps - 5 hold - Supine Shoulder Flexion AAROM with Hands Clasped  - 3 x daily - 7 x weekly - 1 sets - 10 reps - 10 hold - Standing Elbow Flexion Extension AROM  - 3 x daily - 7 x weekly - 1 sets - 10 reps - 3 hold  ASSESSMENT:  CLINICAL IMPRESSION: Patient is a 43 y.o. female who was seen today for physical therapy treatment for s/p left shoulder scope with manipulation.  Pt enters with increase L shoulder pain that has been going on since Saturday. Continues with AAROM and light strengthening to tolerance.  Pt expressed that she did not want to be stretched. LUE ER/IR remains weak with limited motion. She returns to MD Thursday      OBJECTIVE IMPAIRMENTS: decreased activity tolerance, decreased coordination, decreased endurance, decreased ROM, decreased strength, increased edema, increased muscle spasms, impaired flexibility, impaired UE functional use, improper body mechanics, postural dysfunction, and pain.   REHAB POTENTIAL: Good  CLINICAL DECISION MAKING: Evolving/moderate complexity  EVALUATION COMPLEXITY: Moderate   GOALS: Goals reviewed with patient? Yes  SHORT TERM GOALS: Target date: 08/15/24  Independent with initial HEP Baseline: Goal status: Progressing 07/28/24  LONG TERM GOALS: Target date: 10/25/24  Independent with advanced HEP Baseline:  Goal status: INITIAL  2.  Decrease pain 50% Baseline:  Goal status: INITIAL  3.  Improve quick dash score to 30% Baseline: 91% Goal  status: INITIAL  4.  Decrease pain 50% Baseline: 7/10 up to 10/10 with motions Goal status: INITIAL  5.  Do hair and dress without difficulty Baseline: has help to dress and do hair Goal status: INITIAL  6.  Improve left shoulder flexion to 125 degrees Baseline: 20 degrees Goal status: INITIAL  PLAN:  PT FREQUENCY: 2x/week  PT DURATION: 12 weeks  PLANNED INTERVENTIONS: 97164- PT Re-evaluation, 97110-Therapeutic exercises, 97530- Therapeutic activity, 97112- Neuromuscular re-education, 97535- Self Care, [id number]- Manual therapy, Patient/Family education, Taping, Joint mobilization, Cryotherapy, and Moist heat  PLAN FOR NEXT SESSION: really need to get her moving   Tanda KANDICE Sorrow, PTA 08/01/2024, 3:14 PM

## 2024-08-01 NOTE — Telephone Encounter (Signed)
 Patient states she in a lot of pain. Please advise [phone]

## 2024-08-02 ENCOUNTER — Other Ambulatory Visit (HOSPITAL_BASED_OUTPATIENT_CLINIC_OR_DEPARTMENT_OTHER): Payer: Self-pay

## 2024-08-02 ENCOUNTER — Ambulatory Visit (INDEPENDENT_AMBULATORY_CARE_PROVIDER_SITE_OTHER): Admitting: Student

## 2024-08-02 DIAGNOSIS — M25512 Pain in left shoulder: Secondary | ICD-10-CM

## 2024-08-02 DIAGNOSIS — G8929 Other chronic pain: Secondary | ICD-10-CM

## 2024-08-02 MED ORDER — CEPHALEXIN 500 MG PO CAPS
500.0000 mg | ORAL_CAPSULE | Freq: Four times a day (QID) | ORAL | 0 refills | Status: AC
  Filled 2024-08-02: qty 20, 5d supply, fill #0

## 2024-08-02 MED ORDER — ONDANSETRON HCL 4 MG PO TABS
4.0000 mg | ORAL_TABLET | Freq: Three times a day (TID) | ORAL | 0 refills | Status: AC | PRN
  Filled 2024-08-02: qty 20, 7d supply, fill #0

## 2024-08-02 NOTE — Progress Notes (Signed)
 States she feels sick with a fever since surgery. States the incision is so painful. States PT is painful.                                 Post Operative Evaluation    Procedure/Date of Surgery: Left shoulder arthroscopic capsular release 07/21/2024  Interval History:   Patient presents today 12 days status post the above procedure.  Reports that she was doing well until 3 days ago, at which time she developed a low-grade fever as well as nausea and vomiting.  She is having increased tenderness and pain with motion of the left shoulder.  Pain does sometimes travel to the elbow and toward the fingers.  Reports that highest recorded temperature was 100.5 but states that she feels like it has been higher.  She has been using ice, heat, and Tylenol .   PMH/PSH/Family History/Social History/Meds/Allergies:    Past Medical History:  Diagnosis Date   Asthma    followed by pcp   Bipolar 2 disorder (HCC)    Chronic post-traumatic stress disorder (PTSD)    Factor V Leiden mutation 04/2021   per pt had blood done by gyn dr estelle in her office  ;  only one episode DVT LLE 08/ 2022   Fibromyalgia    folllowed by pcp   GAD (generalized anxiety disorder)    GERD (gastroesophageal reflux disease)    no current meds.   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   History of diabetic ketoacidosis    hx multiple admission's for dka (last admission in care everywhere 07-08-2022 @ [facility] w/ mild DKA ,  right buttock abscess, and hyperglycemia   History of DVT of lower extremity    druing pregnancy   last one 04-29-2021  nonoccluded dvt LLE (per was 3 clots in same leg)   History of pregnancy induced hypertension    preeclampsia   Hx MRSA infection    Hx-TIA (transient ischemic attack) 09/2017   no  residual's   Insulin  dependent type 1 diabetes mellitus (HCC) 1997   followed by [facility] --- liliana pitu NP;  (uncontrolled)  uses dexcom and has omnipod insulin  pump   (09-18-2022  fasting average blood sugar 80-220)   Insulin  pump in place    omnipod   MDD (major depressive disorder)    hx   Menorrhagia    Migraine    Panic disorder    Paroxysmal SVT (supraventricular tachycardia) 06/2017   (09-18-2022  per pt no issues since 2018)   PCOS (polycystic ovarian syndrome)    Vitamin D  insufficiency    Past Surgical History:  Procedure Laterality Date   CESAREAN SECTION N/A 06/02/2021   Procedure: CESAREAN SECTION;  Surgeon: Estelle Service, MD;  Location: [facility];  Service: Obstetrics;  Laterality: N/A;   W/  BILATERAL SALPINGECTOMY   CHALAZION EXCISION Left    age 1  (stye)   CHOLECYSTECTOMY, LAPAROSCOPIC  12/02/2006   @[facility]  by dr christella lunger   COLONOSCOPY  06/16/2017   DILATION AND EVACUATION  02/14/2007   @[facility]  by dr winfred   ESOPHAGOGASTRODUODENOSCOPY  11/17/2011   Procedure: ESOPHAGOGASTRODUODENOSCOPY (EGD);  Surgeon:  Jerrell KYM Sol, MD;  Location: [facility];  Service: Endoscopy;  Laterality: N/A;   EXCISION OF BREAST BIOPSY Right    age 7  and reexcision for cancerous skin tag   (benign breast bx)   HYDRADENITIS EXCISION  04/30/2012   Procedure: EXCISION HYDRADENITIS GROIN;  Surgeon: Vicenta DELENA Poli, MD;  Location: [facility];  Service: General;  Laterality: Left;  wide excision hidradenitis bilateral thighs and Left groin   HYDRADENITIS EXCISION Left 01/13/2014   Procedure: WIDE EXCISION HIDRADENITIS LEFT AXILLA;  Surgeon: Vicenta DELENA Poli, MD;  Location: [facility] OR;  Service: General;  Laterality: Left;   HYSTEROSCOPY WITH D & C N/A 09/19/2022   Procedure: DILATATION AND CURETTAGE LELDON WITH MYOSURE;  Surgeon: Estelle Service, MD;  Location: [facility];  Service: Gynecology;  Laterality: N/A;   INCISION AND  DRAINAGE ABSCESS Right 03/17/2019   Procedure: INCISION AND DRAINAGE RIGHT THIGH ABSCESS;  Surgeon: Vanderbilt Ned, MD;  Location: [facility] OR;  Service: General;  Laterality: Right;   INTRAUTERINE DEVICE (IUD) INSERTION N/A 09/19/2022   Procedure: INTRAUTERINE DEVICE (IUD) INSERTION;  Surgeon: Estelle Service, MD;  Location: [facility];  Service: Gynecology;  Laterality: N/A;  Mirena    IRRIGATION AND DEBRIDEMENT ABSCESS Right 06/02/2014   Procedure: IRRIGATION AND DEBRIDEMENT ABSCESS;  Surgeon: Vicenta Poli, MD;  Location: [facility] OR;  Service: General;  Laterality: Right;   IRRIGATION AND DEBRIDEMENT ABSCESS Left 09/23/2014   Procedure: IRRIGATION AND DEBRIDEMENT ABSCESS/LEFT THIGH;  Surgeon: Dann Hummer, MD;  Location: [facility] OR;  Service: General;  Laterality: Left;   LEFT HEART CATHETERIZATION WITH CORONARY ANGIOGRAM N/A 07/14/2014   Procedure: LEFT HEART CATHETERIZATION WITH CORONARY ANGIOGRAM;  Surgeon: Peter M Jordan, MD;  Location: [facility] CATH [facility];  Service: Cardiovascular;  Laterality: N/A;   TAYLOR BUNIONECTOMY Left 2006   TONSILLECTOMY AND ADENOIDECTOMY     age 59   WISDOM TOOTH EXTRACTION     Social History   Socioeconomic History   Marital status: Married    Spouse name: Zonie Crutcher   Number of children: Not on file   Years of education: Not on file   Highest education level: Not on file  Occupational History   Not on file  Tobacco Use   Smoking status: Every Day    Current packs/day: 0.50    Average packs/day: 0.5 packs/day for 20.0 years (10.0 ttl pk-yrs)    Types: Cigarettes   Smokeless tobacco: Never   Tobacco comments:    09-18-2022  per pt had stopped smoking 09-22-2020 but has restart approx 08/ 2023 1/2 ppd,  total average smoking 20 yrs  Vaping Use   Vaping status: Never Used  Substance and Sexual Activity   Alcohol use: No    Alcohol/week: 0.0 standard drinks of alcohol   Drug use: Not Currently    Types: Marijuana    Comment: 2020   Sexual  activity: Yes    Partners: Male    Birth control/protection: Surgical    Comment: tubal w/ c/s 06-02-2021  Other Topics Concern   Not on file  Social History Narrative   Not on file   Social Drivers of Health   Financial Resource Strain: Medium Risk (09/03/2023)   Received from [facility]   Overall Financial Resource Strain (CARDIA)    Difficulty of Paying Living Expenses: Somewhat hard  Food Insecurity: Food Insecurity Present (09/03/2023)   Received from [facility]   Hunger Vital Sign    Within the past 12 months, you  worried that your food would run out before you got the money to buy more.: Sometimes true    Within the past 12 months, the food you bought just didn't last and you didn't have money to get more.: Sometimes true  Transportation Needs: No Transportation Needs (09/03/2023)   Received from [facility]   PRAPARE - Transportation    Lack of Transportation (Medical): No    Lack of Transportation (Non-Medical): No  Physical Activity: Insufficiently Active (09/03/2023)   Received from [facility]   Exercise Vital Sign    On average, how many days per week do you engage in moderate to strenuous exercise (like a brisk walk)?: 3 days    On average, how many minutes do you engage in exercise at this level?: 30 min  Stress: Stress Concern Present (09/03/2023)   Received from [facility] - Occupational Stress Questionnaire    Feeling of Stress : Very much  Social Connections: Somewhat Isolated (09/03/2023)   Received from [facility]   Social Network    How would you rate your social network (family, work, friends)?: Restricted participation with some degree of social isolation   Family History  Problem Relation Age of Onset   Cancer Mother    Anxiety disorder Mother    Depression Mother    Sexual abuse Mother    Breast cancer Mother 40   Hyperlipidemia Sister    Hypertension Sister    Sexual abuse Sister     Hypertension Father    Anxiety disorder Father    Depression Father    Drug abuse Father    Stroke Paternal Uncle    ADD / ADHD Paternal Uncle    Anxiety disorder Paternal Uncle    Anxiety disorder Maternal Aunt    Seizures Maternal Aunt    Anxiety disorder Paternal Aunt    Anxiety disorder Maternal Uncle    ADD / ADHD Cousin    ADD / ADHD Daughter    Allergies  Allergen Reactions   Avandia [Rosiglitazone] Swelling    Swelling of face and legs   Avelox [Moxifloxacin] Shortness Of Breath and Rash   Latex Hives, Shortness Of Breath and Rash   Levaquin [Levofloxacin] Shortness Of Breath and Rash   Omalizumab  Anaphylaxis and Rash    (Xolair )    Oxycodone  Shortness Of Breath, Swelling and Rash    NORCO/ VICODIN OK   Peach [Prunus Persica] Hives and Shortness Of Breath   Potassium-Containing Compounds Other (See Comments)    IV ROUTE - CAUSES VEINS TO COLLAPS; Reports that it is undiluted K only   Prednisone  Other (See Comments)    SEVERE ELEVATION OF BLOOD SUGAR. Able to tolerate 40 mg   Propoxyphene N-Acetaminophen  Swelling    SWELLING OF FACE AND THROAT   Tomato Anaphylaxis    Raw tomato's //  cooked tomato's ok   Adhesive [Tape] Hives, Itching and Rash   Morphine  And Codeine  Other (See Comments)    Causes hallucinations   Prozac [Fluoxetine Hcl] Other (See Comments)    Made her very aggressive    Chantix  [Varenicline ]     dreams   Citrullus Vulgaris Nausea And Vomiting    Facial swelling   Humira [Adalimumab]     Does not remember    Zyvox [Linezolid] Swelling   Cefaclor Rash   Flagyl [Metronidazole] Rash   Keflex [Cephalexin] Diarrhea and Rash    REACTION: severe migraine   Promethazine  Hcl Other (See Comments)  IV ROUTE ONLY - JITTERY FEELING. Patient reports that it is mild and she has used promethazine  since then  PO tablet ok   Septra [Sulfamethoxazole-Trimethoprim] Rash   Sulfadiazine Rash   Current Outpatient Medications  Medication Sig Dispense Refill    cephALEXin (KEFLEX) 500 MG capsule Take 1 capsule (500 mg total) by mouth 4 (four) times daily for 5 days. 20 capsule 0   ondansetron  (ZOFRAN ) 4 MG tablet Take 1 tablet (4 mg total) by mouth every 8 (eight) hours as needed for nausea or vomiting. 20 tablet 0   acetaminophen  (TYLENOL ) 325 MG tablet Take 650 mg by mouth every 4 (four) hours as needed.     albuterol  (VENTOLIN  HFA) 108 (90 Base) MCG/ACT inhaler Inhale into the lungs every 6 (six) hours as needed for wheezing or shortness of breath. (Patient not taking: Reported on 05/14/2023)     apixaban  (ELIQUIS ) 5 MG TABS tablet Take 1 tablet (5 mg total) by mouth 2 (two) times daily. 60 tablet 6   aspirin  EC 325 MG tablet Take 1 tablet (325 mg total) by mouth daily. 14 tablet 0   budesonide-formoterol (SYMBICORT) 160-4.5 MCG/ACT inhaler Inhale 2 puffs into the lungs as needed. (Patient not taking: Reported on 05/14/2023)     clindamycin  (CLINDAGEL) 1 % gel Apply topically 2 (two) times daily. 30 g 1   clonazePAM  (KLONOPIN ) 0.5 MG tablet Take 0.5 mg by mouth 2 (two) times daily as needed for anxiety. (Patient not taking: Reported on 05/14/2023)     doxycycline  (VIBRA -TABS) 100 MG tablet 1 daily as directed 30 tablet 1   gabapentin  (NEURONTIN ) 300 MG capsule Take 1 capsule (300 mg total) by mouth at bedtime. Take once at night for 5 days.  If tolerating well, can increase to twice daily. 60 capsule 1   Glucagon, rDNA, (GLUCAGON EMERGENCY) 1 MG KIT Inject into the vein as directed. (Patient not taking: Reported on 05/14/2023)     HUMALOG  100 UNIT/ML injection SMARTSIG:80 Unit(s) SUB-Q Daily     HYDROcodone -acetaminophen  (NORCO/VICODIN) 5-325 MG tablet Take 1 tablet by mouth every 6 (six) hours as needed for moderate pain (pain score 4-6). 30 tablet 0   HYDROcodone -acetaminophen  (NORCO/VICODIN) 5-325 MG tablet Take 1 tablet by mouth every 6 (six) hours as needed for moderate pain (pain score 4-6). 20 tablet 0   Insulin  Disposable Pump (OMNIPOD 5 G6 PODS,  GEN 5,) MISC As directed 30 each PRN   Insulin  Lispro (HUMALOG  [place]) Inject into the skin as directed. For Insulin  pump  --   basal rate 1.4 units/ hr     pantoprazole  (PROTONIX ) 40 MG tablet Take 40 mg by mouth daily.     spironolactone  (ALDACTONE ) 50 MG tablet TAKE 1 TABLET BY MOUTH EVERY DAY 90 tablet 1   SUMAtriptan (IMITREX) 100 MG tablet Take 100 mg by mouth every 2 (two) hours as needed.     tiZANidine  (ZANAFLEX ) 4 MG tablet Take 1 tablet (4 mg total) by mouth every 8 (eight) hours as needed for muscle spasms. 60 tablet 0   Ubrogepant (UBRELVY) 100 MG TABS 100 mg daily as needed.     No current facility-administered medications for this visit.   No results found.  Review of Systems:   A ROS was performed including pertinent positives and negatives as documented in the HPI.   Musculoskeletal Exam:    There were no vitals taken for this visit.  Left shoulder arthroscopic incisions do show some mild and localized erythema without active drainage.  There is tenderness throughout the left shoulder extending up to the cervical spine even with light touch.  Radial pulses 2+.  Distal neurosensory exam intact.  Imaging:     Assessment:   Patient is almost 2 weeks status post left shoulder lysis of adhesions and manipulation under anesthesia in the setting of adhesive capsulitis.  Over the past 3 days, she has developed increased pain in the shoulder as well as some mild redness around the incisions, a low-grade fever, chills, and fatigue.  There does appear to be a neuropathic element to her pain as she is significantly tender to light touch.  Plan today will be to begin her on a course of antibiotics and have her follow-up within a few days as scheduled to see Dr. Genelle.  Will send Zofran  for nausea.  Encourage continued hydration and addition of ibuprofen  to help with inflammation.  Return precautions discussed.  Plan :    - Return to clinic as scheduled for next postop on 11/13 with  Dr. Genelle      I personally saw and evaluated the patient, and participated in the management and treatment plan.  Leonce Reveal, PA-C Orthopedics

## 2024-08-04 ENCOUNTER — Ambulatory Visit: Admitting: Physical Therapy

## 2024-08-04 ENCOUNTER — Other Ambulatory Visit (HOSPITAL_BASED_OUTPATIENT_CLINIC_OR_DEPARTMENT_OTHER): Payer: Self-pay

## 2024-08-04 ENCOUNTER — Ambulatory Visit (INDEPENDENT_AMBULATORY_CARE_PROVIDER_SITE_OTHER): Admitting: Orthopaedic Surgery

## 2024-08-04 DIAGNOSIS — G8929 Other chronic pain: Secondary | ICD-10-CM

## 2024-08-04 DIAGNOSIS — M25512 Pain in left shoulder: Secondary | ICD-10-CM

## 2024-08-04 MED ORDER — TRAMADOL HCL 50 MG PO TABS
50.0000 mg | ORAL_TABLET | Freq: Four times a day (QID) | ORAL | 0 refills | Status: DC | PRN
  Filled 2024-08-04: qty 10, 3d supply, fill #0

## 2024-08-04 MED ORDER — MELOXICAM 15 MG PO TABS: 15.0000 mg | ORAL_TABLET | Freq: Every day | ORAL | 0 refills | Status: AC

## 2024-08-04 MED ORDER — TRAMADOL HCL 50 MG PO TABS: 50.0000 mg | ORAL_TABLET | Freq: Four times a day (QID) | ORAL | 0 refills | Status: AC | PRN

## 2024-08-04 MED ORDER — LIDOCAINE HCL 1 % IJ SOLN
4.0000 mL | INTRAMUSCULAR | Status: AC | PRN
  Administered 2024-08-04: 4 mL

## 2024-08-04 MED ORDER — MELOXICAM 15 MG PO TABS
15.0000 mg | ORAL_TABLET | Freq: Every day | ORAL | 0 refills | Status: DC
  Filled 2024-08-04: qty 14, 14d supply, fill #0

## 2024-08-04 MED ORDER — TRIAMCINOLONE ACETONIDE 40 MG/ML IJ SUSP
80.0000 mg | INTRAMUSCULAR | Status: AC | PRN
  Administered 2024-08-04: 80 mg via INTRA_ARTICULAR

## 2024-08-04 NOTE — Progress Notes (Signed)
 Post Operative Evaluation      Procedure/Date of Surgery: Left shoulder arthroscopic capsular release 07/21/2024   Interval History:    Patient presents today 2 weeks status post above procedure.  Unfortunately she did have an event where she felt like she was having the arm placed too much in therapy and has now had significant pain requiring a trip to the emergency room.  She denies any drainage from the site.  She provide very limited overhead range of motion from     PMH/PSH/Family History/Social History/Meds/Allergies:         Past Medical History:  Diagnosis Date  . Asthma      followed by pcp  . Bipolar 2 disorder (HCC)    . Chronic post-traumatic stress disorder (PTSD)    . Factor V Leiden mutation 04/2021    per pt had blood done by gyn dr estelle in her office  ;  only one episode DVT LLE 08/ 2022  . Fibromyalgia      folllowed by pcp  . GAD (generalized anxiety disorder)    . GERD (gastroesophageal reflux disease)      no current meds.  . Hidradenitis suppurativa      hx  bilateral upper thigh's, left goin,  and  left axilla  s/p I&D's  with abscess multiple times  . History of adenomatous polyp of colon    . 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 History of diabetic ketoacidosis      hx multiple admission's for dka (last admission in care everywhere 07-08-2022 @ [facility] w/ mild DKA ,  right buttock abscess, and hyperglycemia  . History of DVT of lower extremity      druing pregnancy   last one 04-29-2021  nonoccluded dvt LLE (per was 3 clots in same leg)  . History of pregnancy induced hypertension      preeclampsia  . Hx MRSA infection    . Hx-TIA (transient ischemic attack) 09/2017    no residual's  . Insulin  dependent type 1 diabetes mellitus (HCC) 1997    followed by [facility] --- liliana pitu NP;  (uncontrolled)  uses dexcom and has omnipod insulin  pump   (09-18-2022  fasting average blood  sugar 80-220)  . Insulin  pump in place      omnipod  . MDD (major depressive disorder)      hx  . Menorrhagia    . Migraine    . Panic disorder    . Paroxysmal SVT (supraventricular tachycardia) 06/2017    (09-18-2022  per pt no issues since 2018)  . PCOS (polycystic ovarian syndrome)    . Vitamin D  insufficiency               Past Surgical History:  Procedure Laterality Date  . CESAREAN SECTION N/A 06/02/2021    Procedure: CESAREAN SECTION;  Surgeon: Estelle Service, MD;  Location: [facility];  Service: Obstetrics;  Laterality: N/A;   W/  BILATERAL SALPINGECTOMY  . CHALAZION EXCISION Left      age 12  (stye)  . CHOLECYSTECTOMY, LAPAROSCOPIC   12/02/2006    @[facility]  by dr christella gladis  . COLONOSCOPY   06/16/2017  . DILATION AND EVACUATION   02/14/2007    @[facility]  by dr winfred  . ESOPHAGOGASTRODUODENOSCOPY   11/17/2011    Procedure: ESOPHAGOGASTRODUODENOSCOPY (EGD);  Surgeon: Jerrell KYM Sol, MD;  Location: [facility];  Service: Endoscopy;  Laterality: N/A;  . EXCISION OF BREAST BIOPSY Right      age 71  and reexcision for cancerous skin tag   (benign breast bx)  . HYDRADENITIS EXCISION   04/30/2012    Procedure: EXCISION HYDRADENITIS GROIN;  Surgeon: Vicenta DELENA Poli, MD;  Location: [facility];  Service: General;  Laterality: Left;  wide excision hidradenitis bilateral thighs and Left groin  . HYDRADENITIS EXCISION Left 01/13/2014    Procedure: WIDE EXCISION HIDRADENITIS LEFT AXILLA;  Surgeon: Vicenta DELENA Poli, MD;  Location: [facility] OR;  Service: General;  Laterality: Left;  . HYSTEROSCOPY WITH D & C N/A 09/19/2022    Procedure: DILATATION AND CURETTAGE /HYSTEROSCOPY WITH MYOSURE;  Surgeon: Estelle Service, MD;  Location: [facility];  Service: Gynecology;  Laterality: N/A;  . INCISION AND DRAINAGE ABSCESS Right 03/17/2019    Procedure: INCISION AND DRAINAGE RIGHT THIGH ABSCESS;  Surgeon: Vanderbilt Ned, MD;  Location: [facility] OR;  Service: General;   Laterality: Right;  . INTRAUTERINE DEVICE (IUD) INSERTION N/A 09/19/2022    Procedure: INTRAUTERINE DEVICE (IUD) INSERTION;  Surgeon: Estelle Service, MD;  Location: [facility];  Service: Gynecology;  Laterality: N/A;  Mirena   . IRRIGATION AND DEBRIDEMENT ABSCESS Right 06/02/2014    Procedure: IRRIGATION AND DEBRIDEMENT ABSCESS;  Surgeon: Vicenta Poli, MD;  Location: [facility] OR;  Service: General;  Laterality: Right;  . IRRIGATION AND DEBRIDEMENT ABSCESS Left 09/23/2014    Procedure: IRRIGATION AND DEBRIDEMENT ABSCESS/LEFT THIGH;  Surgeon: Dann Hummer, MD;  Location: [facility] OR;  Service: General;  Laterality: Left;  . LEFT HEART CATHETERIZATION WITH CORONARY ANGIOGRAM N/A 07/14/2014    Procedure: LEFT HEART CATHETERIZATION WITH CORONARY ANGIOGRAM;  Surgeon: Peter M Jordan, MD;  Location: [facility] CATH LAB;  Service: Cardiovascular;  Laterality: N/A;  . TAYLOR BUNIONECTOMY Left 2006  . TONSILLECTOMY AND ADENOIDECTOMY        age 67  . WISDOM TOOTH EXTRACTION            Social History         Socioeconomic History  . Marital status: Married      Spouse name: Airiel Oblinger  . Number of children: Not on file  . Years of education: Not on file  . Highest education level: Not on file  Occupational History  . Not on file  Tobacco Use  . Smoking status: Every Day      Current packs/day: 0.50      Average packs/day: 0.5 packs/day for 20.0 years (10.0 ttl pk-yrs)      Types: Cigarettes  . Smokeless tobacco: Never  . Tobacco comments:      09-18-2022  per pt had stopped smoking 09-22-2020 but has restart approx 08/ 2023 1/2 ppd,  total average smoking 20 yrs  Vaping Use  . Vaping status: Never Used  Substance and Sexual Activity  . Alcohol use: No      Alcohol/week: 0.0 standard drinks of alcohol  . Drug use: Not Currently      Types: Marijuana      Comment: 2020  . Sexual activity: Yes      Partners: Male      Birth control/protection: Surgical      Comment: tubal w/ c/s  06-02-2021  Other Topics Concern  . Not on file  Social History Narrative  . Not on file    Social Drivers of Health        Financial Resource Strain: Medium Risk (09/03/2023)    Received  from [facility]    Overall Financial Resource Strain (CARDIA)    . Difficulty of Paying Living Expenses: Somewhat hard  Food Insecurity: Food Insecurity Present (09/03/2023)    Received from [facility]    Hunger Vital Sign    . Within the past 12 months, you worried that your food would run out before you got the money to buy more.: Sometimes true    . Within the past 12 months, the food you bought just didn't last and you didn't have money to get more.: Sometimes true  Transportation Needs: No Transportation Needs (09/03/2023)    Received from [facility] - Transportation    . Lack of Transportation (Medical): No    . Lack of Transportation (Non-Medical): No  Physical Activity: Insufficiently Active (09/03/2023)    Received from [facility]    Exercise Vital Sign    . On average, how many days per week do you engage in moderate to strenuous exercise (like a brisk walk)?: 3 days    . On average, how many minutes do you engage in exercise at this level?: 30 min  Stress: Stress Concern Present (09/03/2023)    Received from [facility] - Occupational Stress Questionnaire    . Feeling of Stress : Very much  Social Connections: Somewhat Isolated (09/03/2023)    Received from [facility]    Social Network    . How would you rate your social network (family, work, friends)?: Restricted participation with some degree of social isolation         Family History  Problem Relation Age of Onset  . Cancer Mother    . Anxiety disorder Mother    . Depression Mother    . Sexual abuse Mother    . Breast cancer Mother 90  . Hyperlipidemia Sister    . Hypertension Sister    . Sexual abuse Sister    . Hypertension Father    . Anxiety  disorder Father    . Depression Father    . Drug abuse Father    . Stroke Paternal Uncle    . ADD / ADHD Paternal Uncle    . Anxiety disorder Paternal Uncle    . Anxiety disorder Maternal Aunt    . Seizures Maternal Aunt    . Anxiety disorder Paternal Aunt    . Anxiety disorder Maternal Uncle    . ADD / ADHD Cousin    . ADD / ADHD Daughter          Allergies       Allergies  Allergen Reactions  . Avandia [Rosiglitazone] Swelling      Swelling of face and legs  . Avelox [Moxifloxacin] Shortness Of Breath and Rash  . Latex Hives, Shortness Of Breath and Rash  . Levaquin [Levofloxacin] Shortness Of Breath and Rash  . Omalizumab  Anaphylaxis and Rash      (Xolair )   . Oxycodone  Shortness Of Breath, Swelling and Rash      NORCO/ VICODIN OK  . Peach [Prunus Persica] Hives and Shortness Of Breath  . Potassium-Containing Compounds Other (See Comments)      IV ROUTE - CAUSES VEINS TO COLLAPS; Reports that it is undiluted K only  . Prednisone  Other (See Comments)      SEVERE ELEVATION OF BLOOD SUGAR. Able to tolerate 40 mg  . Propoxyphene N-Acetaminophen  Swelling      SWELLING OF FACE AND THROAT  .  Tomato Anaphylaxis      Raw tomato's //  cooked tomato's ok  . Adhesive [Tape] Hives, Itching and Rash  . Morphine  And Codeine  Other (See Comments)      Causes hallucinations  . Prozac [Fluoxetine Hcl] Other (See Comments)      Made her very aggressive   . Chantix  [Varenicline ]        dreams  . Citrullus Vulgaris Nausea And Vomiting      Facial swelling  . Humira [Adalimumab]        Does not remember   . Zyvox [Linezolid] Swelling  . Cefaclor Rash  . Flagyl [Metronidazole] Rash  . Keflex [Cephalexin] Diarrhea and Rash      REACTION: severe migraine  . Promethazine  Hcl Other (See Comments)      IV ROUTE ONLY - JITTERY FEELING. Patient reports that it is mild and she has used promethazine  since then   PO tablet ok  . Septra [Sulfamethoxazole-Trimethoprim] Rash  . Sulfadiazine  Rash            Current Outpatient Medications  Medication Sig Dispense Refill  . cephALEXin (KEFLEX) 500 MG capsule Take 1 capsule (500 mg total) by mouth 4 (four) times daily for 5 days. 20 capsule 0  . ondansetron  (ZOFRAN ) 4 MG tablet Take 1 tablet (4 mg total) by mouth every 8 (eight) hours as needed for nausea or vomiting. 20 tablet 0  . acetaminophen  (TYLENOL ) 325 MG tablet Take 650 mg by mouth every 4 (four) hours as needed.      . albuterol  (VENTOLIN  HFA) 108 (90 Base) MCG/ACT inhaler Inhale into the lungs every 6 (six) hours as needed for wheezing or shortness of breath. (Patient not taking: Reported on 05/14/2023)      . apixaban  (ELIQUIS ) 5 MG TABS tablet Take 1 tablet (5 mg total) by mouth 2 (two) times daily. 60 tablet 6  . aspirin  EC 325 MG tablet Take 1 tablet (325 mg total) by mouth daily. 14 tablet 0  . budesonide-formoterol (SYMBICORT) 160-4.5 MCG/ACT inhaler Inhale 2 puffs into the lungs as needed. (Patient not taking: Reported on 05/14/2023)      . clindamycin  (CLINDAGEL) 1 % gel Apply topically 2 (two) times daily. 30 g 1  . clonazePAM  (KLONOPIN ) 0.5 MG tablet Take 0.5 mg by mouth 2 (two) times daily as needed for anxiety. (Patient not taking: Reported on 05/14/2023)      . doxycycline  (VIBRA -TABS) 100 MG tablet 1 daily as directed 30 tablet 1  . gabapentin  (NEURONTIN ) 300 MG capsule Take 1 capsule (300 mg total) by mouth at bedtime. Take once at night for 5 days.  If tolerating well, can increase to twice daily. 60 capsule 1  . Glucagon, rDNA, (GLUCAGON EMERGENCY) 1 MG KIT Inject into the vein as directed. (Patient not taking: Reported on 05/14/2023)      . HUMALOG  100 UNIT/ML injection SMARTSIG:80 Unit(s) SUB-Q Daily      . HYDROcodone -acetaminophen  (NORCO/VICODIN) 5-325 MG tablet Take 1 tablet by mouth every 6 (six) hours as needed for moderate pain (pain score 4-6). 30 tablet 0  . HYDROcodone -acetaminophen  (NORCO/VICODIN) 5-325 MG tablet Take 1 tablet by mouth every 6 (six)  hours as needed for moderate pain (pain score 4-6). 20 tablet 0  . Insulin  Disposable Pump (OMNIPOD 5 G6 PODS, GEN 5,) MISC As directed 30 each PRN  . Insulin  Lispro (HUMALOG  [place]) Inject into the skin as directed. For Insulin  pump  --   basal rate 1.4 units/ hr      .  pantoprazole  (PROTONIX ) 40 MG tablet Take 40 mg by mouth daily.      . spironolactone  (ALDACTONE ) 50 MG tablet TAKE 1 TABLET BY MOUTH EVERY DAY 90 tablet 1  . SUMAtriptan (IMITREX) 100 MG tablet Take 100 mg by mouth every 2 (two) hours as needed.      . tiZANidine  (ZANAFLEX ) 4 MG tablet Take 1 tablet (4 mg total) by mouth every 8 (eight) hours as needed for muscle spasms. 60 tablet 0  . Ubrogepant (UBRELVY) 100 MG TABS 100 mg daily as needed.          No current facility-administered medications for this visit.      Imaging Results (Last 48 hours)  No results found.     Review of Systems:   A ROS was performed including pertinent positives and negatives as documented in the HPI.     Musculoskeletal Exam:     There were no vitals taken for this visit.   Left shoulder arthroscopic incisions do show some mild and localized erythema without active drainage.  There is tenderness throughout the left shoulder extending up to the cervical spine even with light touch.  Radial pulses 2+.  Distal neurosensory exam intact.   Imaging:       Assessment:   Patient is almost 2 weeks status post left shoulder lysis of adhesions and manipulation under anesthesia in the setting of adhesive capsulitis.  At this time I did discuss that I do not believe her shoulder exam is consistent with infection.  Tenderness to the floor consistent with referring capsulitis in the freezing phase.  Given this I recommended ultrasound-guided injection of her glenohumeral joint; to get her back to where she was prior to really pushing the arm.  She will discontinue physical therapy for the next 2 weeks   Plan :     - Return to clinic 2 weeks for close  follow-up        Procedure Note  Patient: [name]             Date of Birth: 1980/12/15           MRN: [id number]             Visit Date: 08/04/2024  Procedures: Visit Diagnoses: No diagnosis found.  Large Joint Inj: L glenohumeral on 08/04/2024 12:19 PM Indications: pain Details: 22 G 1.5 in needle, ultrasound-guided anterior approach  Arthrogram: No  Medications: 4 mL lidocaine  1 %; 80 mg triamcinolone acetonide 40 MG/ML Outcome: tolerated well, no immediate complications Procedure, treatment alternatives, risks and benefits explained, specific risks discussed. Consent was given by the patient. Immediately prior to procedure a time out was called to verify the correct patient, procedure, equipment, support staff and site/side marked as required. Patient was prepped and draped in the usual sterile fashion.           I personally saw and evaluated the patient, and participated in the management and treatment plan.

## 2024-08-11 ENCOUNTER — Ambulatory Visit

## 2024-08-17 ENCOUNTER — Ambulatory Visit (INDEPENDENT_AMBULATORY_CARE_PROVIDER_SITE_OTHER): Admitting: Orthopaedic Surgery

## 2024-08-17 DIAGNOSIS — G8929 Other chronic pain: Secondary | ICD-10-CM | POA: Diagnosis not present

## 2024-08-17 DIAGNOSIS — M25512 Pain in left shoulder: Secondary | ICD-10-CM

## 2024-08-17 MED ORDER — LIDOCAINE HCL 1 % IJ SOLN
4.0000 mL | INTRAMUSCULAR | Status: AC | PRN
  Administered 2024-08-17: 4 mL

## 2024-08-17 MED ORDER — TRIAMCINOLONE ACETONIDE 40 MG/ML IJ SUSP
80.0000 mg | INTRAMUSCULAR | Status: AC | PRN
  Administered 2024-08-17: 80 mg via INTRA_ARTICULAR

## 2024-08-17 NOTE — Progress Notes (Signed)
 Post Operative Evaluation      Procedure/Date of Surgery: Left shoulder arthroscopic capsular release 07/21/2024   Interval History:    Patient presents today for follow-up status post steroid injection 2 weeks prior.  She did get some relief from this with some improved motion although she is still in significant pain PMH/PSH/Family History/Social History/Meds/Allergies:         Past Medical History:  Diagnosis Date   Asthma      followed by pcp   Bipolar 2 disorder (HCC)     Chronic post-traumatic stress disorder (PTSD)     Factor V Leiden mutation 04/2021    per pt had blood done by gyn dr estelle in her office  ;  only one episode DVT LLE 08/ 2022   Fibromyalgia      folllowed by pcp   GAD (generalized anxiety disorder)     GERD (gastroesophageal reflux disease)      no current meds.   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   History of diabetic ketoacidosis      hx multiple admission's for dka (last admission in care everywhere 07-08-2022 @ [facility] w/ mild DKA ,  right buttock abscess, and hyperglycemia   History of DVT of lower extremity      druing pregnancy   last one 04-29-2021  nonoccluded dvt LLE (per was 3 clots in same leg)   History of pregnancy induced hypertension      preeclampsia   Hx MRSA infection     Hx-TIA (transient ischemic attack) 09/2017    no residual's   Insulin  dependent type 1 diabetes mellitus (HCC) 1997    followed by [facility] --- liliana pitu NP;  (uncontrolled)  uses dexcom and has omnipod insulin  pump   (09-18-2022  fasting average blood sugar 80-220)   Insulin  pump in place      omnipod   MDD (major depressive disorder)      hx   Menorrhagia     Migraine     Panic disorder     Paroxysmal SVT  (supraventricular tachycardia) 06/2017    (09-18-2022  per pt no issues since 2018)   PCOS (polycystic ovarian syndrome)     Vitamin D  insufficiency               Past Surgical History:  Procedure Laterality Date   CESAREAN SECTION N/A 06/02/2021    Procedure: CESAREAN SECTION;  Surgeon: Estelle Service, MD;  Location: [facility];  Service: Obstetrics;  Laterality: N/A;   W/  BILATERAL SALPINGECTOMY   CHALAZION EXCISION Left      age 36  (stye)   CHOLECYSTECTOMY, LAPAROSCOPIC   12/02/2006    @[facility]  by dr christella lunger   COLONOSCOPY   06/16/2017   DILATION AND EVACUATION   02/14/2007    @[facility]  by dr winfred   ESOPHAGOGASTRODUODENOSCOPY   11/17/2011    Procedure: ESOPHAGOGASTRODUODENOSCOPY (EGD);  Surgeon: Jerrell KYM Sol, MD;  Location: [facility];  Service: Endoscopy;  Laterality: N/A;   EXCISION OF BREAST BIOPSY Right      age 65  and reexcision for cancerous skin tag   (benign breast bx)   HYDRADENITIS  EXCISION   04/30/2012    Procedure: EXCISION HYDRADENITIS GROIN;  Surgeon: Vicenta DELENA Poli, MD;  Location: [facility];  Service: General;  Laterality: Left;  wide excision hidradenitis bilateral thighs and Left groin   HYDRADENITIS EXCISION Left 01/13/2014    Procedure: WIDE EXCISION HIDRADENITIS LEFT AXILLA;  Surgeon: Vicenta DELENA Poli, MD;  Location: [facility] OR;  Service: General;  Laterality: Left;   HYSTEROSCOPY WITH D & C N/A 09/19/2022    Procedure: DILATATION AND CURETTAGE LELDON WITH MYOSURE;  Surgeon: Estelle Service, MD;  Location: [facility];  Service: Gynecology;  Laterality: N/A;   INCISION AND DRAINAGE ABSCESS Right 03/17/2019    Procedure: INCISION AND DRAINAGE RIGHT THIGH ABSCESS;  Surgeon: Vanderbilt Ned, MD;  Location: [facility] OR;  Service: General;  Laterality: Right;   INTRAUTERINE DEVICE (IUD) INSERTION N/A 09/19/2022    Procedure: INTRAUTERINE DEVICE (IUD) INSERTION;  Surgeon: Estelle Service, MD;  Location: [facility];  Service: Gynecology;  Laterality: N/A;  Mirena    IRRIGATION AND DEBRIDEMENT ABSCESS Right 06/02/2014    Procedure: IRRIGATION AND DEBRIDEMENT ABSCESS;  Surgeon: Vicenta Poli, MD;  Location: [facility] OR;  Service: General;  Laterality: Right;   IRRIGATION AND DEBRIDEMENT ABSCESS Left 09/23/2014    Procedure: IRRIGATION AND DEBRIDEMENT ABSCESS/LEFT THIGH;  Surgeon: Dann Hummer, MD;  Location: [facility] OR;  Service: General;  Laterality: Left;   LEFT HEART CATHETERIZATION WITH CORONARY ANGIOGRAM N/A 07/14/2014    Procedure: LEFT HEART CATHETERIZATION WITH CORONARY ANGIOGRAM;  Surgeon: Peter M Jordan, MD;  Location: [facility] CATH LAB;  Service: Cardiovascular;  Laterality: N/A;   TAYLOR BUNIONECTOMY Left 2006   TONSILLECTOMY AND ADENOIDECTOMY        age 51   WISDOM TOOTH EXTRACTION            Social History         Socioeconomic History   Marital status: Married      Spouse name: Valita Righter   Number of children: Not on file   Years of education: Not on file   Highest education level: Not on file  Occupational History   Not on file  Tobacco Use   Smoking status: Every Day      Current packs/day: 0.50      Average packs/day: 0.5 packs/day for 20.0 years (10.0 ttl pk-yrs)      Types: Cigarettes   Smokeless tobacco: Never   Tobacco comments:      09-18-2022  per pt had stopped smoking 09-22-2020 but has restart approx 08/ 2023 1/2 ppd,  total average smoking 20 yrs  Vaping Use   Vaping status: Never Used  Substance and Sexual Activity   Alcohol use: No      Alcohol/week: 0.0 standard drinks of alcohol   Drug use: Not Currently      Types: Marijuana      Comment: 2020   Sexual activity: Yes      Partners: Male      Birth control/protection: Surgical      Comment: tubal w/ c/s 06-02-2021  Other Topics Concern   Not on file  Social History Narrative   Not on file    Social Drivers of Health        Financial Resource Strain: Medium Risk (09/03/2023)    Received from [facility]    Overall Financial Resource Strain (CARDIA)     Difficulty of Paying Living Expenses: Somewhat hard  Food Insecurity: Food Insecurity Present (09/03/2023)    Received from  [facility]    Hunger Vital Sign     Within the past 12 months, you worried that your food would run out before you got the money to buy more.: Sometimes true     Within the past 12 months, the food you bought just didn't last and you didn't have money to get more.: Sometimes true  Transportation Needs: No Transportation Needs (09/03/2023)    Received from [facility]    PRAPARE - Transportation     Lack of Transportation (Medical): No     Lack of Transportation (Non-Medical): No  Physical Activity: Insufficiently Active (09/03/2023)    Received from [facility]    Exercise Vital Sign     On average, how many days per week do you engage in moderate to strenuous exercise (like a brisk walk)?: 3 days     On average, how many minutes do you engage in exercise at this level?: 30 min  Stress: Stress Concern Present (09/03/2023)    Received from [facility] - Occupational Stress Questionnaire     Feeling of Stress : Very much  Social Connections: Somewhat Isolated (09/03/2023)    Received from [facility]    Social Network     How would you rate your social network (family, work, friends)?: Restricted participation with some degree of social isolation         Family History  Problem Relation Age of Onset   Cancer Mother     Anxiety disorder Mother     Depression Mother     Sexual abuse Mother     Breast cancer Mother 42   Hyperlipidemia Sister     Hypertension Sister     Sexual abuse Sister     Hypertension Father     Anxiety disorder Father     Depression Father     Drug abuse Father     Stroke Paternal Uncle     ADD / ADHD Paternal Uncle     Anxiety disorder Paternal Uncle     Anxiety disorder Maternal Aunt     Seizures Maternal Aunt      Anxiety disorder Paternal Aunt     Anxiety disorder Maternal Uncle     ADD / ADHD Cousin     ADD / ADHD Daughter          Allergies       Allergies  Allergen Reactions   Avandia [Rosiglitazone] Swelling      Swelling of face and legs   Avelox [Moxifloxacin] Shortness Of Breath and Rash   Latex Hives, Shortness Of Breath and Rash   Levaquin [Levofloxacin] Shortness Of Breath and Rash   Omalizumab  Anaphylaxis and Rash      (Xolair )    Oxycodone  Shortness Of Breath, Swelling and Rash      NORCO/ VICODIN OK   Peach [Prunus Persica] Hives and Shortness Of Breath   Potassium-Containing Compounds Other (See Comments)      IV ROUTE - CAUSES VEINS TO COLLAPS; Reports that it is undiluted K only   Prednisone  Other (See Comments)      SEVERE ELEVATION OF BLOOD SUGAR. Able to tolerate 40 mg   Propoxyphene N-Acetaminophen  Swelling      SWELLING OF FACE AND THROAT   Tomato Anaphylaxis      Raw tomato's //  cooked tomato's ok   Adhesive [Tape] Hives, Itching and Rash   Morphine  And Codeine  Other (See Comments)  Causes hallucinations   Prozac [Fluoxetine Hcl] Other (See Comments)      Made her very aggressive    Chantix  [Varenicline ]        dreams   Citrullus Vulgaris Nausea And Vomiting      Facial swelling   Humira [Adalimumab]        Does not remember    Zyvox [Linezolid] Swelling   Cefaclor Rash   Flagyl [Metronidazole] Rash   Keflex  [Cephalexin ] Diarrhea and Rash      REACTION: severe migraine   Promethazine  Hcl Other (See Comments)      IV ROUTE ONLY - JITTERY FEELING. Patient reports that it is mild and she has used promethazine  since then   PO tablet ok   Septra [Sulfamethoxazole-Trimethoprim] Rash   Sulfadiazine Rash            Current Outpatient Medications  Medication Sig Dispense Refill   cephALEXin  (KEFLEX ) 500 MG capsule Take 1 capsule (500 mg total) by mouth 4 (four) times daily for 5 days. 20 capsule 0   ondansetron  (ZOFRAN ) 4 MG tablet Take 1 tablet (4 mg  total) by mouth every 8 (eight) hours as needed for nausea or vomiting. 20 tablet 0   acetaminophen  (TYLENOL ) 325 MG tablet Take 650 mg by mouth every 4 (four) hours as needed.       albuterol  (VENTOLIN  HFA) 108 (90 Base) MCG/ACT inhaler Inhale into the lungs every 6 (six) hours as needed for wheezing or shortness of breath. (Patient not taking: Reported on 05/14/2023)       apixaban  (ELIQUIS ) 5 MG TABS tablet Take 1 tablet (5 mg total) by mouth 2 (two) times daily. 60 tablet 6   aspirin  EC 325 MG tablet Take 1 tablet (325 mg total) by mouth daily. 14 tablet 0   budesonide-formoterol (SYMBICORT) 160-4.5 MCG/ACT inhaler Inhale 2 puffs into the lungs as needed. (Patient not taking: Reported on 05/14/2023)       clindamycin  (CLINDAGEL) 1 % gel Apply topically 2 (two) times daily. 30 g 1   clonazePAM  (KLONOPIN ) 0.5 MG tablet Take 0.5 mg by mouth 2 (two) times daily as needed for anxiety. (Patient not taking: Reported on 05/14/2023)       doxycycline  (VIBRA -TABS) 100 MG tablet 1 daily as directed 30 tablet 1   gabapentin  (NEURONTIN ) 300 MG capsule Take 1 capsule (300 mg total) by mouth at bedtime. Take once at night for 5 days.  If tolerating well, can increase to twice daily. 60 capsule 1   Glucagon, rDNA, (GLUCAGON EMERGENCY) 1 MG KIT Inject into the vein as directed. (Patient not taking: Reported on 05/14/2023)       HUMALOG  100 UNIT/ML injection SMARTSIG:80 Unit(s) SUB-Q Daily       HYDROcodone -acetaminophen  (NORCO/VICODIN) 5-325 MG tablet Take 1 tablet by mouth every 6 (six) hours as needed for moderate pain (pain score 4-6). 30 tablet 0   HYDROcodone -acetaminophen  (NORCO/VICODIN) 5-325 MG tablet Take 1 tablet by mouth every 6 (six) hours as needed for moderate pain (pain score 4-6). 20 tablet 0   Insulin  Disposable Pump (OMNIPOD 5 G6 PODS, GEN 5,) MISC As directed 30 each PRN   Insulin  Lispro (HUMALOG  [place]) Inject into the skin as directed. For Insulin  pump  --   basal rate 1.4 units/ hr        pantoprazole  (PROTONIX ) 40 MG tablet Take 40 mg by mouth daily.       spironolactone  (ALDACTONE ) 50 MG tablet TAKE 1 TABLET BY MOUTH EVERY DAY 90  tablet 1   SUMAtriptan (IMITREX) 100 MG tablet Take 100 mg by mouth every 2 (two) hours as needed.       tiZANidine  (ZANAFLEX ) 4 MG tablet Take 1 tablet (4 mg total) by mouth every 8 (eight) hours as needed for muscle spasms. 60 tablet 0   Ubrogepant (UBRELVY) 100 MG TABS 100 mg daily as needed.          No current facility-administered medications for this visit.      Imaging Results (Last 48 hours)  No results found.     Review of Systems:   A ROS was performed including pertinent positives and negatives as documented in the HPI.     Musculoskeletal Exam:     There were no vitals taken for this visit.   Left shoulder arthroscopic incisions do show some mild and localized erythema without active drainage.  There is tenderness throughout the left shoulder extending up to the cervical spine even with light touch.  Radial pulses 2+.  Distal neurosensory exam intact.   Imaging:       Assessment:   Patient is almost 2 weeks status post left shoulder lysis of adhesions and manipulation under anesthesia in the setting of adhesive capsulitis.  This time I would like to perform 1 additional injection into the shoulder to get her some relief and I will plan to see her back in 2 weeks for reassessment   Plan :     - Return to clinic 2 weeks for close follow-up        Procedure Note  Patient: [name]             Date of Birth: 02/04/1981           MRN: [id number]             Visit Date: 08/17/2024  Procedures: Visit Diagnoses: No diagnosis found.  Large Joint Inj: L glenohumeral on 08/17/2024 12:44 PM Indications: pain Details: 22 G 1.5 in needle, ultrasound-guided anterior approach  Arthrogram: No  Medications: 4 mL lidocaine  1 %; 80 mg triamcinolone  acetonide 40 MG/ML Outcome: tolerated well, no immediate  complications Procedure, treatment alternatives, risks and benefits explained, specific risks discussed. Consent was given by the patient. Immediately prior to procedure a time out was called to verify the correct patient, procedure, equipment, support staff and site/side marked as required. Patient was prepped and draped in the usual sterile fashion.            I personally saw and evaluated the patient, and participated in the management and treatment plan.

## 2024-08-25 ENCOUNTER — Telehealth: Payer: Self-pay | Admitting: Orthopaedic Surgery

## 2024-08-25 NOTE — Telephone Encounter (Signed)
 I received vm from [employer]. Could not understand callers name. Message was inquiring the revised form that was faxed 08/17/24. Callback number left [phone], but forever on hold. I faxed to [employer] the revised form stating on the cover sheet explaining the revision as the patient is to remain out of work.

## 2024-09-23 ENCOUNTER — Other Ambulatory Visit (HOSPITAL_BASED_OUTPATIENT_CLINIC_OR_DEPARTMENT_OTHER): Payer: Self-pay

## 2024-09-23 ENCOUNTER — Ambulatory Visit (HOSPITAL_BASED_OUTPATIENT_CLINIC_OR_DEPARTMENT_OTHER): Payer: Self-pay | Admitting: Orthopaedic Surgery

## 2024-09-23 ENCOUNTER — Ambulatory Visit (INDEPENDENT_AMBULATORY_CARE_PROVIDER_SITE_OTHER): Admitting: Orthopaedic Surgery

## 2024-09-23 DIAGNOSIS — G8929 Other chronic pain: Secondary | ICD-10-CM

## 2024-09-23 DIAGNOSIS — M25512 Pain in left shoulder: Secondary | ICD-10-CM

## 2024-09-23 MED ORDER — IBUPROFEN 800 MG PO TABS
800.0000 mg | ORAL_TABLET | Freq: Three times a day (TID) | ORAL | 0 refills | Status: AC
  Filled 2024-09-23: qty 30, 10d supply, fill #0

## 2024-09-23 MED ORDER — ASPIRIN 325 MG PO TBEC
325.0000 mg | DELAYED_RELEASE_TABLET | Freq: Every day | ORAL | 0 refills | Status: AC
  Filled 2024-09-23: qty 14, 14d supply, fill #0

## 2024-09-23 MED ORDER — HYDROCODONE-ACETAMINOPHEN 5-325 MG PO TABS
1.0000 | ORAL_TABLET | Freq: Four times a day (QID) | ORAL | 0 refills | Status: AC | PRN
  Filled 2024-09-23: qty 20, 5d supply, fill #0
  Filled 2024-10-26: qty 10, 3d supply, fill #1

## 2024-09-23 NOTE — Progress Notes (Signed)
 "                 Post Operative Evaluation      Procedure/Date of Surgery: Left shoulder arthroscopic capsular release 07/21/2024   Interval History:    Patient presents today for follow-up status post steroid injection.  Unfortunately her frozen shoulder has been progressively worse after she had overdone it with physical therapy.  Since this time the pain is now a 10 out of 10 at baseline. PMH/PSH/Family History/Social History/Meds/Allergies:         Past Medical History:  Diagnosis Date   Asthma      followed by pcp   Bipolar 2 disorder (HCC)     Chronic post-traumatic stress disorder (PTSD)     Factor V Leiden mutation 04/2021    per pt had blood done by gyn dr estelle in her office  ;  only one episode DVT LLE 08/ 2022   Fibromyalgia      folllowed by pcp   GAD (generalized anxiety disorder)     GERD (gastroesophageal reflux disease)      no current meds.   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   History of diabetic ketoacidosis      hx multiple admission's for dka (last admission in care everywhere 07-08-2022 @ [facility] w/ mild DKA ,  right buttock abscess, and hyperglycemia   History of DVT of lower extremity      druing pregnancy   last one 04-29-2021  nonoccluded dvt LLE (per was 3 clots in same leg)   History of pregnancy induced hypertension      preeclampsia   Hx MRSA infection     Hx-TIA (transient ischemic attack) 09/2017    no residual's   Insulin  dependent type 1 diabetes mellitus (HCC) 1997    followed by [facility] --- liliana pitu NP;  (uncontrolled)  uses dexcom and has omnipod insulin  pump   (09-18-2022  fasting average blood sugar 80-220)   Insulin  pump in place      omnipod   MDD (major depressive disorder)      hx   Menorrhagia      Migraine     Panic disorder     Paroxysmal SVT (supraventricular tachycardia) 06/2017    (09-18-2022  per pt no issues since 2018)   PCOS (polycystic ovarian syndrome)     Vitamin D  insufficiency               Past Surgical History:  Procedure Laterality Date   CESAREAN SECTION N/A 06/02/2021    Procedure: CESAREAN SECTION;  Surgeon: Estelle Service, MD;  Location: [facility]S;  Service: Obstetrics;  Laterality: N/A;   W/  BILATERAL SALPINGECTOMY   CHALAZION EXCISION Left      age 44  (stye)   CHOLECYSTECTOMY, LAPAROSCOPIC   12/02/2006    @[facility]  by dr christella lunger   COLONOSCOPY   06/16/2017   DILATION AND EVACUATION   02/14/2007    @[facility]  by dr winfred   ESOPHAGOGASTRODUODENOSCOPY   11/17/2011    Procedure: ESOPHAGOGASTRODUODENOSCOPY (EGD);  Surgeon: Jerrell KYM Sol, MD;  Location: [facility];  Service: Endoscopy;  Laterality: N/A;   EXCISION OF BREAST BIOPSY  Right      age 53  and reexcision for cancerous skin tag   (benign breast bx)   HYDRADENITIS EXCISION   04/30/2012    Procedure: EXCISION HYDRADENITIS GROIN;  Surgeon: Vicenta DELENA Poli, MD;  Location: [facility];  Service: General;  Laterality: Left;  wide excision hidradenitis bilateral thighs and Left groin   HYDRADENITIS EXCISION Left 01/13/2014    Procedure: WIDE EXCISION HIDRADENITIS LEFT AXILLA;  Surgeon: Vicenta DELENA Poli, MD;  Location: [facility] OR;  Service: General;  Laterality: Left;   HYSTEROSCOPY WITH D & C N/A 09/19/2022    Procedure: DILATATION AND CURETTAGE LELDON WITH MYOSURE;  Surgeon: Estelle Service, MD;  Location: [facility];  Service: Gynecology;  Laterality: N/A;   INCISION AND DRAINAGE ABSCESS Right 03/17/2019    Procedure: INCISION AND DRAINAGE RIGHT THIGH ABSCESS;  Surgeon: Vanderbilt Ned, MD;  Location: [facility] OR;  Service: General;  Laterality: Right;   INTRAUTERINE DEVICE (IUD) INSERTION N/A 09/19/2022    Procedure: INTRAUTERINE DEVICE (IUD) INSERTION;  Surgeon:  Estelle Service, MD;  Location: [facility];  Service: Gynecology;  Laterality: N/A;  Mirena    IRRIGATION AND DEBRIDEMENT ABSCESS Right 06/02/2014    Procedure: IRRIGATION AND DEBRIDEMENT ABSCESS;  Surgeon: Vicenta Poli, MD;  Location: [facility] OR;  Service: General;  Laterality: Right;   IRRIGATION AND DEBRIDEMENT ABSCESS Left 09/23/2014    Procedure: IRRIGATION AND DEBRIDEMENT ABSCESS/LEFT THIGH;  Surgeon: Dann Hummer, MD;  Location: [facility] OR;  Service: General;  Laterality: Left;   LEFT HEART CATHETERIZATION WITH CORONARY ANGIOGRAM N/A 07/14/2014    Procedure: LEFT HEART CATHETERIZATION WITH CORONARY ANGIOGRAM;  Surgeon: Peter M Jordan, MD;  Location: [facility] CATH LAB;  Service: Cardiovascular;  Laterality: N/A;   TAYLOR BUNIONECTOMY Left 2006   TONSILLECTOMY AND ADENOIDECTOMY        age 41   WISDOM TOOTH EXTRACTION            Social History         Socioeconomic History   Marital status: Married      Spouse name: Symphani Eckstrom   Number of children: Not on file   Years of education: Not on file   Highest education level: Not on file  Occupational History   Not on file  Tobacco Use   Smoking status: Every Day      Current packs/day: 0.50      Average packs/day: 0.5 packs/day for 20.0 years (10.0 ttl pk-yrs)      Types: Cigarettes   Smokeless tobacco: Never   Tobacco comments:      09-18-2022  per pt had stopped smoking 09-22-2020 but has restart approx 08/ 2023 1/2 ppd,  total average smoking 20 yrs  Vaping Use   Vaping status: Never Used  Substance and Sexual Activity   Alcohol use: No      Alcohol/week: 0.0 standard drinks of alcohol   Drug use: Not Currently      Types: Marijuana      Comment: 2020   Sexual activity: Yes      Partners: Male      Birth control/protection: Surgical      Comment: tubal w/ c/s 06-02-2021  Other Topics Concern   Not on file  Social History Narrative   Not on file    Social Drivers of Health        Financial Resource  Strain: Medium Risk (09/03/2023)    Received from [facility]    Overall Financial Resource Strain (CARDIA)  Difficulty of Paying Living Expenses: Somewhat hard  Food Insecurity: Food Insecurity Present (09/03/2023)    Received from [facility]    Hunger Vital Sign     Within the past 12 months, you worried that your food would run out before you got the money to buy more.: Sometimes true     Within the past 12 months, the food you bought just didn't last and you didn't have money to get more.: Sometimes true  Transportation Needs: No Transportation Needs (09/03/2023)    Received from [facility] - Transportation     Lack of Transportation (Medical): No     Lack of Transportation (Non-Medical): No  Physical Activity: Insufficiently Active (09/03/2023)    Received from [facility]    Exercise Vital Sign     On average, how many days per week do you engage in moderate to strenuous exercise (like a brisk walk)?: 3 days     On average, how many minutes do you engage in exercise at this level?: 30 min  Stress: Stress Concern Present (09/03/2023)    Received from [facility] - Occupational Stress Questionnaire     Feeling of Stress : Very much  Social Connections: Somewhat Isolated (09/03/2023)    Received from [facility]    Social Network     How would you rate your social network (family, work, friends)?: Restricted participation with some degree of social isolation         Family History  Problem Relation Age of Onset   Cancer Mother     Anxiety disorder Mother     Depression Mother     Sexual abuse Mother     Breast cancer Mother 13   Hyperlipidemia Sister     Hypertension Sister     Sexual abuse Sister     Hypertension Father     Anxiety disorder Father     Depression Father     Drug abuse Father     Stroke Paternal Uncle     ADD / ADHD Paternal Uncle     Anxiety disorder Paternal Uncle     Anxiety  disorder Maternal Aunt     Seizures Maternal Aunt     Anxiety disorder Paternal Aunt     Anxiety disorder Maternal Uncle     ADD / ADHD Cousin     ADD / ADHD Daughter          Allergies       Allergies  Allergen Reactions   Avandia [Rosiglitazone] Swelling      Swelling of face and legs   Avelox [Moxifloxacin] Shortness Of Breath and Rash   Latex Hives, Shortness Of Breath and Rash   Levaquin [Levofloxacin] Shortness Of Breath and Rash   Omalizumab  Anaphylaxis and Rash      (Xolair )    Oxycodone  Shortness Of Breath, Swelling and Rash      NORCO/ VICODIN OK   Peach [Prunus Persica] Hives and Shortness Of Breath   Potassium-Containing Compounds Other (See Comments)      IV ROUTE - CAUSES VEINS TO COLLAPS; Reports that it is undiluted K only   Prednisone  Other (See Comments)      SEVERE ELEVATION OF BLOOD SUGAR. Able to tolerate 40 mg   Propoxyphene N-Acetaminophen  Swelling      SWELLING OF FACE AND THROAT   Tomato Anaphylaxis      Raw tomato's //  cooked tomato's ok  Adhesive [Tape] Hives, Itching and Rash   Morphine  And Codeine  Other (See Comments)      Causes hallucinations   Prozac [Fluoxetine Hcl] Other (See Comments)      Made her very aggressive    Chantix  [Varenicline ]        dreams   Citrullus Vulgaris Nausea And Vomiting      Facial swelling   Humira [Adalimumab]        Does not remember    Zyvox [Linezolid] Swelling   Cefaclor Rash   Flagyl [Metronidazole] Rash   Keflex  [Cephalexin ] Diarrhea and Rash      REACTION: severe migraine   Promethazine  Hcl Other (See Comments)      IV ROUTE ONLY - JITTERY FEELING. Patient reports that it is mild and she has used promethazine  since then   PO tablet ok   Septra [Sulfamethoxazole-Trimethoprim] Rash   Sulfadiazine Rash            Current Outpatient Medications  Medication Sig Dispense Refill   cephALEXin  (KEFLEX ) 500 MG capsule Take 1 capsule (500 mg total) by mouth 4 (four) times daily for 5 days. 20 capsule 0    ondansetron  (ZOFRAN ) 4 MG tablet Take 1 tablet (4 mg total) by mouth every 8 (eight) hours as needed for nausea or vomiting. 20 tablet 0   acetaminophen  (TYLENOL ) 325 MG tablet Take 650 mg by mouth every 4 (four) hours as needed.       albuterol  (VENTOLIN  HFA) 108 (90 Base) MCG/ACT inhaler Inhale into the lungs every 6 (six) hours as needed for wheezing or shortness of breath. (Patient not taking: Reported on 05/14/2023)       apixaban  (ELIQUIS ) 5 MG TABS tablet Take 1 tablet (5 mg total) by mouth 2 (two) times daily. 60 tablet 6   aspirin  EC 325 MG tablet Take 1 tablet (325 mg total) by mouth daily. 14 tablet 0   budesonide-formoterol (SYMBICORT) 160-4.5 MCG/ACT inhaler Inhale 2 puffs into the lungs as needed. (Patient not taking: Reported on 05/14/2023)       clindamycin  (CLINDAGEL) 1 % gel Apply topically 2 (two) times daily. 30 g 1   clonazePAM  (KLONOPIN ) 0.5 MG tablet Take 0.5 mg by mouth 2 (two) times daily as needed for anxiety. (Patient not taking: Reported on 05/14/2023)       doxycycline  (VIBRA -TABS) 100 MG tablet 1 daily as directed 30 tablet 1   gabapentin  (NEURONTIN ) 300 MG capsule Take 1 capsule (300 mg total) by mouth at bedtime. Take once at night for 5 days.  If tolerating well, can increase to twice daily. 60 capsule 1   Glucagon, rDNA, (GLUCAGON EMERGENCY) 1 MG KIT Inject into the vein as directed. (Patient not taking: Reported on 05/14/2023)       HUMALOG  100 UNIT/ML injection SMARTSIG:80 Unit(s) SUB-Q Daily       HYDROcodone -acetaminophen  (NORCO/VICODIN) 5-325 MG tablet Take 1 tablet by mouth every 6 (six) hours as needed for moderate pain (pain score 4-6). 30 tablet 0   HYDROcodone -acetaminophen  (NORCO/VICODIN) 5-325 MG tablet Take 1 tablet by mouth every 6 (six) hours as needed for moderate pain (pain score 4-6). 20 tablet 0   Insulin  Disposable Pump (OMNIPOD 5 G6 PODS, GEN 5,) MISC As directed 30 each PRN   Insulin  Lispro (HUMALOG  ) Inject into the skin as directed. For  Insulin  pump  --   basal rate 1.4 units/ hr       pantoprazole  (PROTONIX ) 40 MG tablet Take 40 mg by mouth daily.  spironolactone  (ALDACTONE ) 50 MG tablet TAKE 1 TABLET BY MOUTH EVERY DAY 90 tablet 1   SUMAtriptan (IMITREX) 100 MG tablet Take 100 mg by mouth every 2 (two) hours as needed.       tiZANidine  (ZANAFLEX ) 4 MG tablet Take 1 tablet (4 mg total) by mouth every 8 (eight) hours as needed for muscle spasms. 60 tablet 0   Ubrogepant (UBRELVY) 100 MG TABS 100 mg daily as needed.          No current facility-administered medications for this visit.      Imaging Results (Last 48 hours)  No results found.     Review of Systems:   A ROS was performed including pertinent positives and negatives as documented in the HPI.     Musculoskeletal Exam:     There were no vitals taken for this visit.   Left shoulder arthroscopic incisions do show some mild and localized erythema without active drainage.  There is tenderness throughout the left shoulder extending up to the cervical spine even with light touch.  Radial pulses 2+.  Distal neurosensory exam intact.   Imaging:       Assessment:   Patient is status post lyse of adhesions and manipulation.  Unfortunately she is having significant 10 out of 10 pain just at rest now.  She is not able to sleep as result of this.  I did describe the fact that she has now failed an additional physical therapy round as well as an injection and that we could perform 1 additional lysis of adhesions and manipulation.  I did describe that I would plan on doing a postop injection as well as a less aggressive rehab protocol this time.  After discussion she would like to proceed   Plan :     - Plan for left shoulder arthroscopy with lysis of adhesions and manipulation under anesthesia   After a lengthy discussion of treatment options, including risks, benefits, alternatives, complications of surgical and nonsurgical conservative options, the patient  elected surgical repair.   The patient  is aware of the material risks  and complications including, but not limited to injury to adjacent structures, neurovascular injury, infection, numbness, bleeding, implant failure, thermal burns, stiffness, persistent pain, failure to heal, disease transmission from allograft, need for further surgery, dislocation, anesthetic risks, blood clots, risks of death,and others. The probabilities of surgical success and failure discussed with patient given their particular co-morbidities.The time and nature of expected rehabilitation and recovery was discussed.The patient's questions were all answered preoperatively.  No barriers to understanding were noted. I explained the natural history of the disease process and Rx rationale.  I explained to the patient what I considered to be reasonable expectations given their personal situation.  The final treatment plan was arrived at through a shared patient decision making process model.        I personally saw and evaluated the patient, and participated in the management and treatment plan. "

## 2024-10-05 ENCOUNTER — Other Ambulatory Visit (HOSPITAL_BASED_OUTPATIENT_CLINIC_OR_DEPARTMENT_OTHER): Payer: Self-pay | Admitting: Orthopaedic Surgery

## 2024-10-05 DIAGNOSIS — G8929 Other chronic pain: Secondary | ICD-10-CM

## 2024-10-20 ENCOUNTER — Encounter (HOSPITAL_BASED_OUTPATIENT_CLINIC_OR_DEPARTMENT_OTHER): Payer: Self-pay | Admitting: Orthopaedic Surgery

## 2024-10-20 DIAGNOSIS — M7502 Adhesive capsulitis of left shoulder: Secondary | ICD-10-CM | POA: Diagnosis not present

## 2024-10-20 NOTE — Progress Notes (Signed)
 Date of Surgery: 10/20/2024    INDICATIONS: Ms. Dicicco is a 44 y.o.-year-old female with left shoulder adhesive capsulitis.  The risk and benefits of the procedure were discussed in detail and documented in the pre-operative evaluation.    PREOPERATIVE DIAGNOSES: Left shoulder, adhesive capsulitis.   POSTOPERATIVE DIAGNOSIS: Same.   PROCEDURE: Arthroscopic capsular release 29825   SURGEON: Elspeth LITTIE Parker MD   ASSISTANT: Conley Funk, ATC   ANESTHESIA:  general   IV FLUIDS AND URINE: See anesthesia record.   ANTIBIOTICS: Ancef   ESTIMATED BLOOD LOSS: 5 mL.   IMPLANTS:  * No surgical log found *   DRAINS: None   CULTURES: None   COMPLICATIONS: none   PROCEDURE:    OPERATIVE FINDING: Exam under anesthesia:    Examination under anesthesia revealed forward elevation of 150 degrees.  With the arm at the side, there was 65 degrees of external rotation.  There is a 1+ anterior load shift and a 1+ posterior load shift.     Arthroscopic findings demonstrated: Articular space: Rotator interval redness/injection Chondral surfaces: Normal Biceps: Intact Subscapularis: Intact Supraspinatus: Normal Infraspinatus: Intact     I identified the patient in the pre-operative holding area.  I marked the operative right shoulder with my initials. I reviewed the risks and benefits of the proposed surgical intervention and the patient wished to proceed.  Anesthesia was then performed with regional block.  The patient was transferred to the operative suite and placed in the beach chair position with all bony prominences padded.     SCDs were placed on bilateral lower extremity. Appropriate antibiotics was administered within 1 hour before incision.  Anesthesia was induced.  The operative extremity was then prepped and draped in standard fashion. A time out was performed confirming the correct extremity, correct patient and correct procedure.   The arthroscope was introduced in the  glenohumeral joint from a posterior portal.  An anterior portal was created.  The shoulder was examined and the above findings were noted.     With an arthroscopic shaver and a wand ablator, synovitis throughout the  shoulder was resected.  The arthroscopic shaver was used to excise torn portions of the labrum back to a stable margin. Specifically this was done for the anterior and superior labrum as well as rotator interval. The rotator interval was debrided using shaver and electrocautery.   The shoulder was irrigated.  The arthroscopic instruments were removed.  Wounds were closed with 3-0 nylon sutures.  A sterile dressing was applied with xeroform, 4x8s, abdominal pad, and tape. An Rosalita was placed and the upper extremity was placed in a shoulder immobilizer.  The patient tolerated the procedure well and was taken to the recovery room in stable condition.  All counts were correct in the case. The patient tolerated the procedure well and was taken to the recovery room in stable condition.       POSTOPERATIVE PLAN: She will be weight bearing activity as tolerated when the nerve block wears off. She will be on aspirin  for 2 weeks for DVT prophylaxis.   Elspeth LITTIE Parker, MD

## 2024-10-22 ENCOUNTER — Ambulatory Visit (HOSPITAL_BASED_OUTPATIENT_CLINIC_OR_DEPARTMENT_OTHER): Admitting: Physical Therapy

## 2024-10-26 ENCOUNTER — Other Ambulatory Visit (HOSPITAL_BASED_OUTPATIENT_CLINIC_OR_DEPARTMENT_OTHER): Payer: Self-pay

## 2024-10-26 ENCOUNTER — Other Ambulatory Visit: Payer: Self-pay

## 2024-10-26 ENCOUNTER — Ambulatory Visit (HOSPITAL_BASED_OUTPATIENT_CLINIC_OR_DEPARTMENT_OTHER): Payer: Self-pay | Admitting: Physical Therapy

## 2024-10-26 DIAGNOSIS — M25612 Stiffness of left shoulder, not elsewhere classified: Secondary | ICD-10-CM

## 2024-10-26 DIAGNOSIS — M25512 Pain in left shoulder: Secondary | ICD-10-CM

## 2024-10-26 DIAGNOSIS — R293 Abnormal posture: Secondary | ICD-10-CM

## 2024-10-26 DIAGNOSIS — R600 Localized edema: Secondary | ICD-10-CM

## 2024-10-26 DIAGNOSIS — M6281 Muscle weakness (generalized): Secondary | ICD-10-CM

## 2024-10-28 ENCOUNTER — Telehealth (HOSPITAL_BASED_OUTPATIENT_CLINIC_OR_DEPARTMENT_OTHER): Payer: Self-pay | Admitting: Orthopaedic Surgery

## 2024-10-28 ENCOUNTER — Other Ambulatory Visit (HOSPITAL_BASED_OUTPATIENT_CLINIC_OR_DEPARTMENT_OTHER): Payer: Self-pay | Admitting: Orthopaedic Surgery

## 2024-10-28 ENCOUNTER — Ambulatory Visit (HOSPITAL_BASED_OUTPATIENT_CLINIC_OR_DEPARTMENT_OTHER): Payer: Self-pay

## 2024-10-28 ENCOUNTER — Encounter (HOSPITAL_BASED_OUTPATIENT_CLINIC_OR_DEPARTMENT_OTHER): Payer: Self-pay

## 2024-10-28 DIAGNOSIS — R293 Abnormal posture: Secondary | ICD-10-CM

## 2024-10-28 DIAGNOSIS — M6281 Muscle weakness (generalized): Secondary | ICD-10-CM

## 2024-10-28 DIAGNOSIS — M25612 Stiffness of left shoulder, not elsewhere classified: Secondary | ICD-10-CM

## 2024-10-28 DIAGNOSIS — M25512 Pain in left shoulder: Secondary | ICD-10-CM

## 2024-10-28 DIAGNOSIS — R600 Localized edema: Secondary | ICD-10-CM

## 2024-10-28 MED ORDER — HYDROCODONE-ACETAMINOPHEN 5-325 MG PO TABS: 1.0000 | ORAL_TABLET | Freq: Four times a day (QID) | ORAL | 0 refills | Status: AC | PRN

## 2024-10-28 NOTE — Telephone Encounter (Signed)
 Patient would like Vicodin sent to pharmacy down stairs. Insurance want pay but for 20 at a time. She is at PT downstairs now. If this is sent later in the day send to her pharmacy  [facility]

## 2024-10-28 NOTE — Therapy (Signed)
 " OUTPATIENT PHYSICAL THERAPY SHOULDER EVALUATION   Patient Name: [name] MRN: [id number] DOB:[birth year], 44 y.o., female Today's Date: 10/28/2024  END OF SESSION:  PT End of Session - 10/28/24 1102     Visit Number 2    Number of Visits 25    Date for Recertification  01/17/25    Authorization Type [employer]    Authorization Time Period 10/26/2024-01/23/2025    Authorization - Visit Number 2    Authorization - Number of Visits 11    PT Start Time 1100    PT Stop Time 1143    PT Time Calculation (min) 43 min    Activity Tolerance Patient tolerated treatment well    Behavior During Therapy WFL for tasks assessed/performed           Past Medical History:  Diagnosis Date   Asthma    followed by pcp   Bipolar 2 disorder (HCC)    Chronic post-traumatic stress disorder (PTSD)    Factor V Leiden mutation 04/2021   per pt had blood done by gyn dr estelle in her office  ;  only one episode DVT LLE 08/ 2022   Fibromyalgia    folllowed by pcp   GAD (generalized anxiety disorder)    GERD (gastroesophageal reflux disease)    no current meds.   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   History of diabetic ketoacidosis    hx multiple admission's for dka (last admission in care everywhere 07-08-2022 @ [facility] w/ mild DKA ,  right buttock abscess, and hyperglycemia   History of DVT of lower extremity    druing pregnancy   last one 04-29-2021  nonoccluded dvt LLE (per was 3 clots in same leg)   History of pregnancy induced hypertension    preeclampsia   Hx MRSA infection    Hx-TIA (transient ischemic attack) 09/2017   no residual's   Insulin  dependent type 1 diabetes mellitus (HCC) 1997   followed by [facility] --- liliana pitu NP;  (uncontrolled)  uses dexcom  and has omnipod insulin  pump   (09-18-2022  fasting average blood sugar 80-220)   Insulin  pump in place    omnipod   MDD (major depressive disorder)    hx   Menorrhagia    Migraine    Panic disorder    Paroxysmal SVT (supraventricular tachycardia) 06/2017   (09-18-2022  per pt no issues since 2018)   PCOS (polycystic ovarian syndrome)    Vitamin D  insufficiency    Past Surgical History:  Procedure Laterality Date   CESAREAN SECTION N/A 06/02/2021   Procedure: CESAREAN SECTION;  Surgeon: Estelle Service, MD;  Location: [facility];  Service: Obstetrics;  Laterality: N/A;   W/  BILATERAL SALPINGECTOMY   CHALAZION EXCISION Left    age 43  (stye)   CHOLECYSTECTOMY, LAPAROSCOPIC  12/02/2006   @[facility]  by dr christella lunger   COLONOSCOPY  06/16/2017   DILATION AND EVACUATION  02/14/2007   @[facility]  by dr winfred   ESOPHAGOGASTRODUODENOSCOPY  11/17/2011   Procedure: ESOPHAGOGASTRODUODENOSCOPY (EGD);  Surgeon: Jerrell KYM Sol, MD;  Location: [facility];  Service: Endoscopy;  Laterality: N/A;   EXCISION OF BREAST BIOPSY Right    age 8  and  reexcision for cancerous skin tag   (benign breast bx)   HYDRADENITIS EXCISION  04/30/2012   Procedure: EXCISION HYDRADENITIS GROIN;  Surgeon: Vicenta DELENA Poli, MD;  Location: [facility];  Service: General;  Laterality: Left;  wide excision hidradenitis bilateral thighs and Left groin   HYDRADENITIS EXCISION Left 01/13/2014   Procedure: WIDE EXCISION HIDRADENITIS LEFT AXILLA;  Surgeon: Vicenta DELENA Poli, MD;  Location: [facility] OR;  Service: General;  Laterality: Left;   HYSTEROSCOPY WITH D & C N/A 09/19/2022   Procedure: DILATATION AND CURETTAGE LELDON WITH MYOSURE;  Surgeon: Estelle Service, MD;  Location: [facility];  Service: Gynecology;  Laterality: N/A;   INCISION AND DRAINAGE ABSCESS Right 03/17/2019   Procedure: INCISION AND DRAINAGE RIGHT THIGH ABSCESS;  Surgeon: Vanderbilt Ned, MD;  Location: [facility] OR;  Service:  General;  Laterality: Right;   INTRAUTERINE DEVICE (IUD) INSERTION N/A 09/19/2022   Procedure: INTRAUTERINE DEVICE (IUD) INSERTION;  Surgeon: Estelle Service, MD;  Location: [facility];  Service: Gynecology;  Laterality: N/A;  Mirena    IRRIGATION AND DEBRIDEMENT ABSCESS Right 06/02/2014   Procedure: IRRIGATION AND DEBRIDEMENT ABSCESS;  Surgeon: Vicenta Poli, MD;  Location: [facility] OR;  Service: General;  Laterality: Right;   IRRIGATION AND DEBRIDEMENT ABSCESS Left 09/23/2014   Procedure: IRRIGATION AND DEBRIDEMENT ABSCESS/LEFT THIGH;  Surgeon: Dann Hummer, MD;  Location: [facility] OR;  Service: General;  Laterality: Left;   LEFT HEART CATHETERIZATION WITH CORONARY ANGIOGRAM N/A 07/14/2014   Procedure: LEFT HEART CATHETERIZATION WITH CORONARY ANGIOGRAM;  Surgeon: Peter M Jordan, MD;  Location: [facility] CATH LAB;  Service: Cardiovascular;  Laterality: N/A;   TAYLOR BUNIONECTOMY Left 2006   TONSILLECTOMY AND ADENOIDECTOMY     age 58   WISDOM TOOTH EXTRACTION     Patient Active Problem List   Diagnosis Date Noted   Precordial chest pain 02/19/2023   S/P primary low transverse C-section 06/02/2021   Preeclampsia, severe 05/31/2021   PIH (pregnancy induced hypertension), third trimester 05/30/2021   Elevated blood pressure affecting pregnancy in third trimester, antepartum 05/24/2021   Difficulty coping 11/16/2020   Chronic posttraumatic stress disorder 04/21/2019   DKA (diabetic ketoacidosis) (HCC) 03/15/2019   Abscess of right thigh 03/15/2019   Bipolar 2 disorder (HCC) 01/04/2019   Panic disorder 01/04/2019   Enteritis 12/24/2017   Intractable nausea and vomiting 12/24/2017   Epigastric pain    Other chest pain 09/28/2017   TIA (transient ischemic attack) 09/28/2017   Paroxysmal SVT (supraventricular tachycardia) 06/24/2017   Dizziness 06/24/2017   Weakness 06/24/2017   Hyperglycemia 03/17/2016   Abscess 03/17/2016   Chest discomfort 07/10/2014   Cellulitis 06/01/2014    Cellulitis of right thigh 06/01/2014   Postop check 01/19/2014   Smoking addiction 08/29/2013   DKA, type 1 (HCC) 05/09/2013   Hidradenitis suppurativa 04/21/2012   Esophageal reflux 11/17/2011   Seasonal and perennial allergic rhinitis 05/20/2010   BRONCHITIS, ACUTE 11/29/2007   HYPONATREMIA 11/14/2007   GAD (generalized anxiety disorder) 10/24/2007   Sinus tachycardia 07/07/2007   Diabetes mellitus type I (HCC) 04/08/2007   Smoker 04/08/2007   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   MIGRAINES, HX OF 04/08/2007    PCP: Teresa Channel MD   REFERRING PROVIDER: Genelle Standing, MD  REFERRING DIAG: [phone] (ICD-10-CM) - Chronic left shoulder pain  THERAPY DIAG:  Stiffness of left shoulder, not elsewhere classified  Acute pain of left shoulder  Muscle weakness (generalized)  Localized edema  Abnormal posture  Rationale for Evaluation and Treatment: Rehabilitation  ONSET DATE:  PROCEDURE: Arthroscopic capsular release 10/20/24  SUBJECTIVE:                                                                                                                                                                                      SUBJECTIVE STATEMENT:  Pt reports some flare up in pain with the elbow flexion and anterior shoulder rolls. 6/10 pain level at entry. Unable to sleep due to pain.   Eval: Had this procedure late last year and had some PT, there was  a time where I was over-stretched and I heard a pop and after that I really got set back. We tried some injections after that which didn't help. Had the other surgery 1/29, have been trying to do some exercises they started me with at [facility].   Hand dominance: Right  PERTINENT HISTORY: See below   PAIN:  Are you having pain? Yes: NPRS scale: 6/10 now,  Pain location: L shoulder Pain description: general ache, constant, can get some spurts of pain at elbow  Aggravating factors: sleeping at night, sometimes reaching  with other arm (causes pulling) Relieving factors: iceman, pain meds   PRECAUTIONS: Shoulder  RED FLAGS: None   WEIGHT BEARING RESTRICTIONS: No WBAT after nerve block per op note   FALLS:  Has patient fallen in last 6 months? 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 Can be physical job depending on the day   PLOF: Independent, Independent with basic ADLs, Independent with gait, and Independent with transfers  PATIENT GOALS: be functional again- work, lift 44 year old, be able to put arm behind my back, be able to reach up into cabinets   NEXT MD VISIT:   OBJECTIVE:  Note: Objective measures were completed at Evaluation unless otherwise noted.    PATIENT SURVEYS:  UEFS  Extreme difficulty/unable (0), Quite a bit of difficulty (1), Moderate difficulty (2), Little difficulty (3), No difficulty (4) Survey date:   10/26/24 eval   Any of your usual work, household or school activities 1  2. Your usual hobbies, recreational/sport activities 3   3. Lifting a bag of groceries to waist level 0   4. Lifting a bag of groceries above your head 0  5. Grooming your hair 0  6. Pushing up on your hands (I.e. from bathtub or chair) 0  7. Preparing food (I.e. peeling/cutting) 4  8. Driving  3  9. Vacuuming, sweeping, or raking 2  10. Dressing  0  11. Doing up buttons 2  12. Using  tools/appliances 1  13. Opening doors 0  14. Cleaning  0  15. Tying or lacing shoes 0  16. Sleeping  1  17. Laundering clothes (I.e. washing, ironing, folding) 0  18. Opening a jar 0  19. Throwing a ball 0  20. Carrying a small suitcase with your affected limb.  0  Score total:  17/80     COGNITION: Overall cognitive status: Within functional limits for tasks assessed     SENSATION: Not tested  POSTURE: Forward head, rounded shoulders   UPPER  EXTREMITY ROM:    ROM Left Eval PROM  Shoulder flexion ~90*, painful   Shoulder extension   Shoulder abduction ~85*, painful   Shoulder adduction   Shoulder internal rotation   Shoulder external rotation Approximately -20* by side, painful   Elbow flexion   Elbow extension   Wrist flexion   Wrist extension   Wrist ulnar deviation   Wrist radial deviation   Wrist pronation   Wrist supination   (Blank rows = not tested)  UPPER EXTREMITY MMT:  MMT Right eval Left eval  Shoulder flexion    Shoulder extension    Shoulder abduction    Shoulder adduction    Shoulder internal rotation    Shoulder external rotation    Middle trapezius    Lower trapezius    Elbow flexion    Elbow extension    Wrist flexion    Wrist extension    Wrist ulnar deviation    Wrist radial deviation    Wrist pronation    Wrist supination    Grip strength (lbs)    (Blank rows = not tested)  DNT MMT at eval                                                                                                                              TREATMENT DATE:   10/28/24 PROM  Well arm assisted flexion x5 Scap retraction 2x10 Isometric flex, ext 5 x10ea (trialled abd, but too painful) Posterior shoulder rolls x20 Pendulums x20ea (cues for proper performance) Dressing change Discussion of healing timeline, PT goals, HEP, pain management strategies   10/26/24  Eval, POC, HEP  Dressing change- all incisions C/D/I but did note some redness on both and will continue to monitor closely, placed gauze and tegaderm   HEP discussion/practice PRN- she was already familiar with these from last course of PT   PATIENT EDUCATION: Education details: exam findings, POC, HEP, dressing and incision care Person educated: Patient Education method: Explanation, Demonstration, Verbal cues, and Handouts Education comprehension: verbalized understanding, returned demonstration, and needs further education  HOME EXERCISE  PROGRAM:  Access Code: [email or web address] URL: [email or web address] Date: 10/26/2024 Prepared by: Josette Rough  ASSESSMENT:  CLINICAL IMPRESSION:  Focused on gentle PROM today careful not to exceed pain limitations. She tolerated this well. Able to complete gentle isometrics for shoulder flexion and extension. Discussed PT plan, pain management strategies, and activity modifications with pt.  Changed dressing and inspected incisions. More posterior incision visibly irritated and red. Sent picture to ATC upstairs who assured it was not concerning at this time. Will continue to monitor this. Pt has MD f/u on 2/12.   Eval: Patient is a 44 y.o. F who was seen today for physical therapy evaluation and treatment for M25.512,G89.29 (ICD-10-CM) - Chronic left shoulder pain, now post-op with above procedure. Of note, she did have this same procedure and very limited PT in the latter part of 2025. Will make every effort to address functional limitations and assist in return to optimal level of function.    OBJECTIVE IMPAIRMENTS: decreased ROM, decreased strength, hypomobility, increased edema, increased fascial restrictions, impaired UE functional use, postural dysfunction, and pain.   ACTIVITY LIMITATIONS: carrying, lifting, bending, bed mobility, bathing, toileting, dressing, reach over head, and hygiene/grooming  PARTICIPATION LIMITATIONS: meal prep, cleaning, laundry, driving, shopping, community activity, occupation, and yard work  PERSONAL FACTORS: Fitness, Past/current experiences, and Time since onset of injury/illness/exacerbation are also affecting patient's functional outcome.   REHAB POTENTIAL: Good  CLINICAL DECISION MAKING: Stable/uncomplicated  EVALUATION COMPLEXITY: Low   GOALS: Goals reviewed with patient? No  SHORT TERM GOALS: Target date: 12/06/2024    Patient will be compliant with appropriate progressive HEP        GOAL STATUS: Initial  2. L shoulder flexion  and ABD A/PROM to be at least 130*         GOAL STATUS: Initial    3. L shoulder IR and IR A/PROM to be at least 45*          GOAL STATUS: Initial    4. Will be more aware of posture with all functional tasks with use of ergonomic aides PRN/as desired      GOAL STATUS: Initial    LONG TERM GOALS: Target date: 01/17/2025    MMT to have improved by at least one grade in all weak groups       GOAL STATUS: Initial    2. AROM to have normalized and will be pain free all planes of motion      GOAL STATUS: Initial    3. Pain to be no more than 2/10 with all functional tasks     GOAL STATUS: Initial   4. Will be able to perform all functional household and required work tasks without increase from resting pain levels     GOAL STATUS: Initial   5. UEFS to have improved by at least 20 points to show improved QOL and subjective perception of condition      GOAL STATUS: Initial   6. Will be able to lift her child/perform all childcare activities without increase in pain  GOAL STATUS: Initial   PLAN:  PT FREQUENCY: 2x/week  PT DURATION: 12 weeks  PLANNED INTERVENTIONS: 97750- Physical Performance Testing, 97110-Therapeutic exercises, 97530- Therapeutic activity, 97112- Neuromuscular re-education, 97535- Self Care, and [id number]- Manual therapy  PLAN FOR NEXT SESSION: per protocol, keep an eye on incisions- both had some redness at eval.    Asberry FORBES [name], PTA 10/28/2024, 1:28 PM   For all possible CPT codes, reference the Planned Interventions line above.     Check all conditions that are expected to impact treatment: {Conditions expected to impact treatment:Morbid obesity, Diabetes mellitus, Psychological or psychiatric disorders, Social determinants of health, and Complications related to surgery   If treatment provided at initial evaluation, no treatment charged due to lack of authorization.       "

## 2024-11-01 ENCOUNTER — Ambulatory Visit (HOSPITAL_BASED_OUTPATIENT_CLINIC_OR_DEPARTMENT_OTHER): Payer: Self-pay | Admitting: Physical Therapy

## 2024-11-03 ENCOUNTER — Encounter (HOSPITAL_BASED_OUTPATIENT_CLINIC_OR_DEPARTMENT_OTHER): Admitting: Orthopaedic Surgery

## 2024-11-03 ENCOUNTER — Encounter (HOSPITAL_BASED_OUTPATIENT_CLINIC_OR_DEPARTMENT_OTHER): Payer: Self-pay | Admitting: Physical Therapy

## 2024-11-07 ENCOUNTER — Encounter (HOSPITAL_BASED_OUTPATIENT_CLINIC_OR_DEPARTMENT_OTHER): Payer: Self-pay

## 2024-11-09 ENCOUNTER — Encounter (HOSPITAL_BASED_OUTPATIENT_CLINIC_OR_DEPARTMENT_OTHER): Payer: Self-pay

## 2024-11-14 ENCOUNTER — Encounter (HOSPITAL_BASED_OUTPATIENT_CLINIC_OR_DEPARTMENT_OTHER): Payer: Self-pay

## 2024-11-16 ENCOUNTER — Encounter (HOSPITAL_BASED_OUTPATIENT_CLINIC_OR_DEPARTMENT_OTHER): Payer: Self-pay | Admitting: Physical Therapy

## 2024-11-22 ENCOUNTER — Encounter (HOSPITAL_BASED_OUTPATIENT_CLINIC_OR_DEPARTMENT_OTHER)

## 2024-11-25 ENCOUNTER — Encounter (HOSPITAL_BASED_OUTPATIENT_CLINIC_OR_DEPARTMENT_OTHER)

## 2024-11-28 ENCOUNTER — Encounter (HOSPITAL_BASED_OUTPATIENT_CLINIC_OR_DEPARTMENT_OTHER)

## 2024-11-30 ENCOUNTER — Encounter (HOSPITAL_BASED_OUTPATIENT_CLINIC_OR_DEPARTMENT_OTHER): Admitting: Physical Therapy
